# Patient Record
Sex: Female | Born: 1945 | Race: White | Hispanic: No | Marital: Married | Smoking: Former smoker
Health system: Southern US, Community
[De-identification: ages and names within clinical notes are randomized; demographics above are authoritative.]

## PROBLEM LIST (undated history)

## (undated) DIAGNOSIS — E119 Type 2 diabetes mellitus without complications: Secondary | ICD-10-CM

## (undated) DIAGNOSIS — C8330 Diffuse large B-cell lymphoma, unspecified site: Secondary | ICD-10-CM

## (undated) DIAGNOSIS — R591 Generalized enlarged lymph nodes: Secondary | ICD-10-CM

## (undated) DIAGNOSIS — E7800 Pure hypercholesterolemia, unspecified: Secondary | ICD-10-CM

## (undated) DIAGNOSIS — Z9221 Personal history of antineoplastic chemotherapy: Secondary | ICD-10-CM

## (undated) DIAGNOSIS — Z923 Personal history of irradiation: Secondary | ICD-10-CM

## (undated) DIAGNOSIS — I10 Essential (primary) hypertension: Secondary | ICD-10-CM

## (undated) DIAGNOSIS — Z8711 Personal history of peptic ulcer disease: Secondary | ICD-10-CM

## (undated) DIAGNOSIS — Z8719 Personal history of other diseases of the digestive system: Secondary | ICD-10-CM

## (undated) DIAGNOSIS — R011 Cardiac murmur, unspecified: Secondary | ICD-10-CM

## (undated) DIAGNOSIS — J849 Interstitial pulmonary disease, unspecified: Secondary | ICD-10-CM

## (undated) HISTORY — DX: Generalized enlarged lymph nodes: R59.1

## (undated) HISTORY — PX: HERNIA REPAIR: SHX51

## (undated) HISTORY — DX: Personal history of other diseases of the digestive system: Z87.19

## (undated) HISTORY — PX: TONSILLECTOMY: SUR1361

## (undated) HISTORY — DX: Personal history of peptic ulcer disease: Z87.11

## (undated) HISTORY — DX: Pure hypercholesterolemia, unspecified: E78.00

## (undated) HISTORY — PX: PARTIAL HYSTERECTOMY: SHX80

## (undated) MED FILL — Dexamethasone Sodium Phosphate Inj 100 MG/10ML: INTRAMUSCULAR | Qty: 1 | Status: AC

---

## 2004-06-01 ENCOUNTER — Ambulatory Visit: Payer: Self-pay | Admitting: Internal Medicine

## 2004-11-30 ENCOUNTER — Ambulatory Visit: Payer: Self-pay | Admitting: Internal Medicine

## 2005-11-19 ENCOUNTER — Ambulatory Visit: Payer: Self-pay | Admitting: General Surgery

## 2005-11-27 ENCOUNTER — Ambulatory Visit: Payer: Self-pay | Admitting: General Surgery

## 2006-01-16 ENCOUNTER — Other Ambulatory Visit: Payer: Self-pay

## 2006-01-23 ENCOUNTER — Inpatient Hospital Stay: Payer: Self-pay | Admitting: General Surgery

## 2006-09-16 ENCOUNTER — Ambulatory Visit: Payer: Self-pay | Admitting: General Surgery

## 2006-11-21 ENCOUNTER — Ambulatory Visit: Payer: Self-pay | Admitting: Internal Medicine

## 2006-11-25 ENCOUNTER — Ambulatory Visit: Payer: Self-pay | Admitting: Internal Medicine

## 2007-06-23 ENCOUNTER — Ambulatory Visit: Payer: Self-pay | Admitting: Internal Medicine

## 2007-11-17 ENCOUNTER — Encounter: Payer: Self-pay | Admitting: Pulmonary Disease

## 2007-11-21 ENCOUNTER — Encounter: Payer: Self-pay | Admitting: Pulmonary Disease

## 2007-12-02 ENCOUNTER — Encounter: Payer: Self-pay | Admitting: Pulmonary Disease

## 2008-01-16 ENCOUNTER — Encounter: Payer: Self-pay | Admitting: Pulmonary Disease

## 2008-01-23 ENCOUNTER — Encounter: Payer: Self-pay | Admitting: Pulmonary Disease

## 2008-03-01 ENCOUNTER — Encounter: Payer: Self-pay | Admitting: Pulmonary Disease

## 2008-03-22 ENCOUNTER — Encounter: Payer: Self-pay | Admitting: Pulmonary Disease

## 2008-07-13 ENCOUNTER — Ambulatory Visit: Payer: Self-pay | Admitting: Internal Medicine

## 2008-07-28 ENCOUNTER — Ambulatory Visit: Payer: Self-pay | Admitting: Internal Medicine

## 2008-08-20 HISTORY — PX: BREAST BIOPSY: SHX20

## 2008-10-20 ENCOUNTER — Ambulatory Visit: Payer: Self-pay | Admitting: Pulmonary Disease

## 2008-10-20 DIAGNOSIS — I10 Essential (primary) hypertension: Secondary | ICD-10-CM | POA: Insufficient documentation

## 2008-10-20 DIAGNOSIS — E119 Type 2 diabetes mellitus without complications: Secondary | ICD-10-CM

## 2008-10-20 DIAGNOSIS — J309 Allergic rhinitis, unspecified: Secondary | ICD-10-CM | POA: Insufficient documentation

## 2008-10-20 DIAGNOSIS — E785 Hyperlipidemia, unspecified: Secondary | ICD-10-CM

## 2008-10-20 DIAGNOSIS — G4733 Obstructive sleep apnea (adult) (pediatric): Secondary | ICD-10-CM

## 2008-10-20 DIAGNOSIS — J8410 Pulmonary fibrosis, unspecified: Secondary | ICD-10-CM

## 2008-11-23 ENCOUNTER — Telehealth (INDEPENDENT_AMBULATORY_CARE_PROVIDER_SITE_OTHER): Payer: Self-pay | Admitting: *Deleted

## 2008-12-15 ENCOUNTER — Encounter: Payer: Self-pay | Admitting: Pulmonary Disease

## 2009-01-04 ENCOUNTER — Ambulatory Visit: Payer: Self-pay | Admitting: Cardiovascular Disease

## 2009-01-04 ENCOUNTER — Ambulatory Visit: Payer: Self-pay | Admitting: Pulmonary Disease

## 2009-01-08 ENCOUNTER — Emergency Department: Payer: Self-pay | Admitting: Emergency Medicine

## 2009-01-10 ENCOUNTER — Telehealth (INDEPENDENT_AMBULATORY_CARE_PROVIDER_SITE_OTHER): Payer: Self-pay | Admitting: *Deleted

## 2009-01-12 ENCOUNTER — Ambulatory Visit: Payer: Self-pay | Admitting: Pulmonary Disease

## 2009-01-26 ENCOUNTER — Ambulatory Visit: Payer: Self-pay | Admitting: Internal Medicine

## 2009-07-11 ENCOUNTER — Ambulatory Visit: Payer: Self-pay | Admitting: Pulmonary Disease

## 2010-01-04 ENCOUNTER — Ambulatory Visit: Payer: Self-pay | Admitting: Pulmonary Disease

## 2010-01-04 ENCOUNTER — Telehealth (INDEPENDENT_AMBULATORY_CARE_PROVIDER_SITE_OTHER): Payer: Self-pay | Admitting: *Deleted

## 2010-06-29 ENCOUNTER — Inpatient Hospital Stay: Payer: Self-pay

## 2010-09-19 NOTE — Progress Notes (Signed)
Summary: need old cxr films  LMTCBx1  Phone Note Outgoing Call Call back at [facility] Phone [phone]   Call placed by: Arman Filter LPN,  Jan 04, 2010 11:17 AM Call placed to: Patient Summary of Call: Pt was just seen by [facility] today.  KC informed pt will will have [facility] radiologist view cxr from today and compare it with cxr done at [facility].  Called and spoke with radiology dept at [facility] and was informed they have no way to access cxrs from [facility].  Pt will therefore need to go to [facility] and pick up a disc with a copy of her cxr images on it and drop it off at the office for [facility] to review.  LMOMTCBX1 to inform pt of this.  Arman Filter LPN  Jan 04, 2010 11:20 AM  Initial call taken by: Arman Filter LPN,  Jan 04, 2010 11:20 AM  Follow-up for Phone Call        pt called back [phone] - please call her back. Follow-up by: Eugene Gavia,  Jan 04, 2010 1:12 PM  Additional Follow-up for Phone Call Additional follow up Details #1::        called and spoke with pt.  pt aware of the above information.  Pt states she will go to [facility] and request cxr on disk and bring to the office for [facility] to view.  Arman Filter LPN  Jan 04, 2010 4:33 PM      Appended Document: need old cxr films  LMTCBx1 I have not received disc from [facility] yet. But did receive a disc from [facility] with cxr from 03/2008 and 10/2007.  Put in Kc's very important look at folder for him to review.

## 2010-09-19 NOTE — Assessment & Plan Note (Signed)
Summary: rov for PF   Copy to:  Self Referral Primary Provider/Referring Provider:  Gardiner Rhyme at [facility]  CC:  Pt is here for a f/u appt to discuss PFT and CXR results. .  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  It has been unclear whether this is related to a CT disease with ANA very elevated, or whether this is simply IPF.  She has been evaluated at [facility] by pulm and rheum, and surveillance was recommended at this point.  She has had pfts which show very little change from prior, and her cxr today shows changes of PF.  Her last film was in [place], and we need to get this for comparison, but by my memory shows little change.  The pt feels that she is at her usual baseline, with no change in exertional tolerance or breathing.  She is staying very active.  Denies any significant cough or congestion.  Current Medications (verified): 1)  None  Allergies (verified): No Known Drug Allergies  Review of Systems      See HPI  Vital Signs:  Patient profile:   65 year old female Height:      67 inches Weight:      181 pounds BMI:     28.45 O2 Sat:      96 % on Room air Temp:     98.1 degrees F oral Pulse rate:   110 / minute BP sitting:   158 / 96  (right arm) Cuff size:   regular  Vitals Entered By: Arman Filter LPN (Jan 04, 2010 10:01 AM)  O2 Flow:  Room air CC: Pt is here for a f/u appt to discuss PFT and CXR results.  Comments Medications reviewed with patient. Megan Reynolds LPN  Jan 04, 2010 10:02 AM    Physical Exam  General:  wd female in nad Lungs:  crackles 1/3 up bilat with no wheezing or rhonchi Heart:  rrr, no mrg Extremities:  minimal LE edema, no cyanosis Neurologic:  alert and oriented, moves all 4.   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  At this point, will continue to  monitor her closely, and she is to call if she has worsening symptoms.  I will obtain disc of her cxr from [facility] to directly compare to her current film.  She will followup with me in 6mos to check on her progress.  Other Orders: T-2 View CXR (71020TC) Est. Patient Level III (16109)  Patient Instructions: 1)  no change in treatment plans.  Please call if you begin to have worsening symptoms 2)  work on weight loss and conditioning. 3)  followup with me in 6mos   Immunization History:  Influenza Immunization History:    Influenza:  historical (05/20/2009)  Pneumovax Immunization History:    Pneumovax:  historical (05/20/2008)

## 2010-09-19 NOTE — Miscellaneous (Signed)
Summary: Orders Update pft charges  Clinical Lists Changes  Orders: Added new Service order of Carbon Monoxide diffusing w/capacity (94720) - Signed Added new Service order of Lung Volumes (94240) - Signed Added new Service order of Spirometry (Pre & Post) (94060) - Signed 

## 2010-10-23 ENCOUNTER — Ambulatory Visit: Payer: Self-pay | Admitting: Gastroenterology

## 2011-01-03 ENCOUNTER — Ambulatory Visit: Payer: Self-pay | Admitting: Internal Medicine

## 2011-07-10 ENCOUNTER — Ambulatory Visit: Payer: Self-pay | Admitting: Internal Medicine

## 2011-07-21 ENCOUNTER — Ambulatory Visit: Payer: Self-pay | Admitting: Internal Medicine

## 2011-08-21 ENCOUNTER — Ambulatory Visit: Payer: Self-pay | Admitting: Internal Medicine

## 2012-03-06 ENCOUNTER — Ambulatory Visit: Payer: Self-pay | Admitting: Internal Medicine

## 2013-03-10 ENCOUNTER — Ambulatory Visit: Payer: Self-pay | Admitting: Internal Medicine

## 2013-03-23 DIAGNOSIS — J849 Interstitial pulmonary disease, unspecified: Secondary | ICD-10-CM | POA: Insufficient documentation

## 2014-04-13 ENCOUNTER — Ambulatory Visit: Payer: Self-pay | Admitting: Internal Medicine

## 2014-04-15 ENCOUNTER — Ambulatory Visit: Payer: Self-pay | Admitting: Internal Medicine

## 2014-07-01 ENCOUNTER — Ambulatory Visit: Payer: Self-pay | Admitting: Ophthalmology

## 2014-07-01 LAB — POTASSIUM: POTASSIUM: 4.1 mmol/L (ref 3.5–5.1)

## 2014-07-12 ENCOUNTER — Ambulatory Visit: Payer: Self-pay | Admitting: Ophthalmology

## 2014-12-11 NOTE — Op Note (Signed)
PATIENT NAME:  Rose Hill, Rose Hill MR#:  [id number] DATE OF BIRTH:  1946-08-14  DATE OF PROCEDURE:  07/12/2014  PREOPERATIVE DIAGNOSIS: Cataract, right eye.   POSTOPERATIVE DIAGNOSIS:  Cataract, right eye.  PROCEDURE PERFORMED: Extracapsular cataract extraction using phacoemulsification with placement Alcon [id number], 23.0 diopter posterior chamber lens, serial number [id number].   SURGEON: Loura Back. Kalena Mander, M.D.   ANESTHESIA: 4% lidocaine and 0.75% Marcaine, 50-50 ratio of 10 units per mL of Hylenex added given as peribulbar.   ANESTHESIOLOGIST:  Dr. Benjamine Mola.   COMPLICATIONS: None.   ESTIMATED BLOOD LOSS:  Less than 1 ml.  DESCRIPTION OF PROCEDURE:  The patient was brought to the operating room and given a peribulbar block.  The patient was then prepped and draped in the usual fashion.  The vertical rectus muscles were imbricated using 5-0 silk sutures.  These sutures were then clamped to the sterile drapes as bridle sutures.  A limbal peritomy was performed extending two clock hours and hemostasis was obtained with cautery.  A partial thickness scleral groove was made at the surgical limbus and dissected anteriorly in a lamellar dissection using an Alcon crescent knife.  The anterior chamber was entered supero-temporally with a Superblade and through the lamellar dissection with a 2.6 mm keratome.  DisCoVisc was used to replace the aqueous and a continuous tear capsulorrhexis was carried out.  Hydrodissection and hydrodelineation were carried out with balanced salt and a 27 gauge canula.  The nucleus was rotated to confirm the effectiveness of the hydrodissection.  Phacoemulsification was carried out using a divide-and-conquer technique.  Total ultrasound time was 1 minutes and 30 seconds with an average power of 24.3%, CDE of 42.55.   Irrigation/aspiration was used to remove the residual cortex.  DisCoVisc was used to inflate the capsule and the internal incision was enlarged to 3 mm with the  crescent knife.  The intraocular lens was folded and inserted into the capsular bag using the AcrySert delivery system. Irrigation/aspiration was used to remove the residual DisCoVisc.  A 0.10 mL of cefuroxime containing 1 mg of drug was injected into the anterior chamber through the paracentesis track to inflate the anterior chamber and induce miosis.  The wound was checked for leaks and none were found. The conjunctiva was closed with cautery and the bridle sutures were removed.  Two drops of 0.3% Vigamox were placed on the eye.   An eye shield was placed on the eye.  The patient was discharged to the recovery room in good condition.     ____________________________ Loura Back Rose Sturgell, MD sad:DT D: 07/12/2014 13:15:12 ET T: 07/12/2014 13:58:22 ET JOB#: [id number]  cc: Remo Lipps A. Belvie Iribe, MD, <Dictator> Martie Lee MD ELECTRONICALLY SIGNED 07/19/2014 13:25

## 2015-04-01 ENCOUNTER — Other Ambulatory Visit: Payer: Self-pay | Admitting: Internal Medicine

## 2015-04-01 DIAGNOSIS — M5010 Cervical disc disorder with radiculopathy, unspecified cervical region: Secondary | ICD-10-CM

## 2015-04-01 DIAGNOSIS — Z1231 Encounter for screening mammogram for malignant neoplasm of breast: Secondary | ICD-10-CM

## 2015-04-06 ENCOUNTER — Ambulatory Visit
Admission: RE | Admit: 2015-04-06 | Discharge: 2015-04-06 | Disposition: A | Discharge status: Home / Self Care | Payer: Medicare Other | Source: Ambulatory Visit | Attending: Internal Medicine | Admitting: Internal Medicine

## 2015-04-06 DIAGNOSIS — M5010 Cervical disc disorder with radiculopathy, unspecified cervical region: Secondary | ICD-10-CM

## 2015-04-15 ENCOUNTER — Other Ambulatory Visit: Payer: Self-pay | Admitting: Internal Medicine

## 2015-04-15 ENCOUNTER — Ambulatory Visit
Admission: RE | Admit: 2015-04-15 | Discharge: 2015-04-15 | Disposition: A | Discharge status: Home / Self Care | Payer: Medicare Other | Source: Ambulatory Visit | Attending: Internal Medicine | Admitting: Internal Medicine

## 2015-04-15 DIAGNOSIS — Z1231 Encounter for screening mammogram for malignant neoplasm of breast: Secondary | ICD-10-CM | POA: Diagnosis not present

## 2015-06-20 DIAGNOSIS — K219 Gastro-esophageal reflux disease without esophagitis: Secondary | ICD-10-CM | POA: Insufficient documentation

## 2016-03-25 DIAGNOSIS — Z85828 Personal history of other malignant neoplasm of skin: Secondary | ICD-10-CM | POA: Insufficient documentation

## 2016-04-03 DIAGNOSIS — Z0000 Encounter for general adult medical examination without abnormal findings: Secondary | ICD-10-CM | POA: Insufficient documentation

## 2016-07-25 ENCOUNTER — Other Ambulatory Visit: Payer: Self-pay | Admitting: Internal Medicine

## 2016-07-25 DIAGNOSIS — R1084 Generalized abdominal pain: Secondary | ICD-10-CM

## 2016-07-26 ENCOUNTER — Ambulatory Visit
Admission: RE | Admit: 2016-07-26 | Discharge: 2016-07-26 | Disposition: A | Discharge status: Home / Self Care | Payer: Medicare Other | Source: Ambulatory Visit | Attending: Internal Medicine | Admitting: Internal Medicine

## 2016-07-26 DIAGNOSIS — R1084 Generalized abdominal pain: Secondary | ICD-10-CM

## 2016-07-26 DIAGNOSIS — Z90710 Acquired absence of both cervix and uterus: Secondary | ICD-10-CM | POA: Insufficient documentation

## 2016-07-26 DIAGNOSIS — R590 Localized enlarged lymph nodes: Secondary | ICD-10-CM | POA: Insufficient documentation

## 2016-07-26 DIAGNOSIS — K449 Diaphragmatic hernia without obstruction or gangrene: Secondary | ICD-10-CM | POA: Insufficient documentation

## 2016-07-26 DIAGNOSIS — R932 Abnormal findings on diagnostic imaging of liver and biliary tract: Secondary | ICD-10-CM | POA: Diagnosis not present

## 2016-07-26 HISTORY — DX: Type 2 diabetes mellitus without complications: E11.9

## 2016-07-26 HISTORY — DX: Essential (primary) hypertension: I10

## 2016-07-26 HISTORY — DX: Interstitial pulmonary disease, unspecified: J84.9

## 2016-07-26 MED ORDER — IOPAMIDOL (ISOVUE-300) INJECTION 61%
100.0000 mL | Freq: Once | INTRAVENOUS | Status: AC | PRN
  Administered 2016-07-26: 100 mL via INTRAVENOUS

## 2016-07-27 DIAGNOSIS — C8303 Small cell B-cell lymphoma, intra-abdominal lymph nodes: Secondary | ICD-10-CM | POA: Insufficient documentation

## 2016-07-31 ENCOUNTER — Inpatient Hospital Stay: Payer: Medicare Other | Attending: Internal Medicine | Admitting: Internal Medicine

## 2016-07-31 ENCOUNTER — Encounter: Payer: Self-pay | Admitting: Internal Medicine

## 2016-07-31 ENCOUNTER — Telehealth: Payer: Self-pay | Admitting: *Deleted

## 2016-07-31 ENCOUNTER — Inpatient Hospital Stay: Payer: Medicare Other

## 2016-07-31 ENCOUNTER — Other Ambulatory Visit: Payer: Self-pay | Admitting: *Deleted

## 2016-07-31 DIAGNOSIS — I700 Atherosclerosis of aorta: Secondary | ICD-10-CM | POA: Insufficient documentation

## 2016-07-31 DIAGNOSIS — E7800 Pure hypercholesterolemia, unspecified: Secondary | ICD-10-CM | POA: Diagnosis not present

## 2016-07-31 DIAGNOSIS — J849 Interstitial pulmonary disease, unspecified: Secondary | ICD-10-CM | POA: Diagnosis not present

## 2016-07-31 DIAGNOSIS — Z7984 Long term (current) use of oral hypoglycemic drugs: Secondary | ICD-10-CM | POA: Insufficient documentation

## 2016-07-31 DIAGNOSIS — R61 Generalized hyperhidrosis: Secondary | ICD-10-CM | POA: Diagnosis not present

## 2016-07-31 DIAGNOSIS — Z87891 Personal history of nicotine dependence: Secondary | ICD-10-CM

## 2016-07-31 DIAGNOSIS — F40240 Claustrophobia: Secondary | ICD-10-CM

## 2016-07-31 DIAGNOSIS — R590 Localized enlarged lymph nodes: Secondary | ICD-10-CM | POA: Insufficient documentation

## 2016-07-31 DIAGNOSIS — Z8669 Personal history of other diseases of the nervous system and sense organs: Secondary | ICD-10-CM | POA: Diagnosis not present

## 2016-07-31 DIAGNOSIS — R1900 Intra-abdominal and pelvic swelling, mass and lump, unspecified site: Secondary | ICD-10-CM | POA: Insufficient documentation

## 2016-07-31 DIAGNOSIS — I10 Essential (primary) hypertension: Secondary | ICD-10-CM | POA: Diagnosis not present

## 2016-07-31 DIAGNOSIS — M5136 Other intervertebral disc degeneration, lumbar region: Secondary | ICD-10-CM | POA: Insufficient documentation

## 2016-07-31 DIAGNOSIS — E119 Type 2 diabetes mellitus without complications: Secondary | ICD-10-CM | POA: Insufficient documentation

## 2016-07-31 DIAGNOSIS — R109 Unspecified abdominal pain: Secondary | ICD-10-CM | POA: Insufficient documentation

## 2016-07-31 DIAGNOSIS — Z79899 Other long term (current) drug therapy: Secondary | ICD-10-CM | POA: Diagnosis not present

## 2016-07-31 DIAGNOSIS — R197 Diarrhea, unspecified: Secondary | ICD-10-CM | POA: Diagnosis not present

## 2016-07-31 DIAGNOSIS — K449 Diaphragmatic hernia without obstruction or gangrene: Secondary | ICD-10-CM | POA: Diagnosis not present

## 2016-07-31 LAB — COMPREHENSIVE METABOLIC PANEL
ALT: 10 U/L — ABNORMAL LOW (ref 14–54)
AST: 17 U/L (ref 15–41)
Albumin: 4.4 g/dL (ref 3.5–5.0)
Alkaline Phosphatase: 52 U/L (ref 38–126)
Anion gap: 11 (ref 5–15)
BUN: 17 mg/dL (ref 6–20)
CO2: 25 mmol/L (ref 22–32)
Calcium: 9.3 mg/dL (ref 8.9–10.3)
Chloride: 100 mmol/L — ABNORMAL LOW (ref 101–111)
Creatinine, Ser: 0.54 mg/dL (ref 0.44–1.00)
GFR calc Af Amer: >60 mL/min (ref >60)
GFR calc non Af Amer: >60 mL/min (ref >60)
Glucose, Bld: 113 mg/dL — ABNORMAL HIGH (ref 65–99)
Potassium: 3.7 mmol/L (ref 3.5–5.1)
Sodium: 136 mmol/L (ref 135–145)
Total Bilirubin: 0.7 mg/dL (ref 0.3–1.2)
Total Protein: 8.1 g/dL (ref 6.5–8.1)

## 2016-07-31 LAB — CBC WITH DIFFERENTIAL/PLATELET
Basophils Absolute: 0.1 10*3/uL (ref 0–0.1)
Basophils Relative: 1 %
Eosinophils Absolute: 0.2 10*3/uL (ref 0–0.7)
Eosinophils Relative: 2 %
HCT: 40.6 % (ref 35.0–47.0)
Hemoglobin: 13.5 g/dL (ref 12.0–16.0)
Lymphocytes Relative: 20 %
Lymphs Abs: 1.6 10*3/uL (ref 1.0–3.6)
MCH: 27.5 pg (ref 26.0–34.0)
MCHC: 33.2 g/dL (ref 32.0–36.0)
MCV: 82.9 fL (ref 80.0–100.0)
Monocytes Absolute: 0.8 10*3/uL (ref 0.2–0.9)
Monocytes Relative: 10 %
Neutro Abs: 5.1 10*3/uL (ref 1.4–6.5)
Neutrophils Relative %: 67 %
Platelets: 273 10*3/uL (ref 150–440)
RBC: 4.91 MIL/uL (ref 3.80–5.20)
RDW: 13.9 % (ref 11.5–14.5)
WBC: 7.7 10*3/uL (ref 3.6–11.0)

## 2016-07-31 LAB — PROTIME-INR
INR: 0.92
PROTHROMBIN TIME: 12.4 s (ref 11.4–15.2)

## 2016-07-31 LAB — LACTATE DEHYDROGENASE: LDH: 155 U/L (ref 98–192)

## 2016-07-31 LAB — APTT: aPTT: 27 seconds (ref 24–36)

## 2016-07-31 MED ORDER — DIAZEPAM 5 MG PO TABS: 5.0000 mg | ORAL_TABLET | Freq: Once | ORAL | 0 refills | Status: AC

## 2016-07-31 NOTE — Telephone Encounter (Signed)
Rx for diazepam 5 mg #3 sent to patients pharmacy.

## 2016-07-31 NOTE — Assessment & Plan Note (Addendum)
#  Bulky retroperitoneal lymphadenopathy [approximately 8-10 cm mass wrapping around the renal blood vessels.] highly suspicious for lymphoma. Recommend CBC CMP and LDH and beta-2 microglobulin. Also recommend SPEP. Recommend a PET scan for further evaluation. Patient needs a tissue diagnosis. Order hepatitis workup. Also discussed that patient might need a bone marrow biopsy if confirmed as lymphoma.  # I again stressed the importance of tissue diagnosis- before discussing treatment options or prognosis.   # I recommend a follow-up in approximately 1 week; based upon above workup. I discussed with radiology Dr.Henn- agrees with biopsy under CT guidance; however will wait for PET other easily accessible lesions.   # The above plan of care was discussed with the patient and family in detail. # I reviewed the imaging independently [as summarized above]; and with the patient in detail.   Thank you Dr. Sabra Heck for allowing me to participate in the care of your pleasant patient. Please do not hesitate to contact me with questions or concerns in the interim.

## 2016-07-31 NOTE — Telephone Encounter (Signed)
-----   Message from [place] sent at 07/31/2016 10:48 AM EST ----- Regarding: Pt needs sedative for PET Patient is scheduled for PET Friday, Dec. 15 at 11:30 with 11am arrival time. She has claustrophobia and will require a sedative called in to her pharmacy in advance. Thanks.

## 2016-07-31 NOTE — Progress Notes (Signed)
[place] NOTE  Patient Care Team: Rusty Aus, [name]  CHIEF COMPLAINTS/PURPOSE OF CONSULTATION: Abdominal mass  # DEC 2017- bulky retroperitoneal adenopathy ~8x10 cm   # ILD [ idopathic]   No history exists.     HISTORY OF PRESENTING ILLNESS:  Rose Hill 70 y.o.  female with a history of interstitial lung disease idiopathic;  has been referred to [place] for further evaluation and recommendations regarding a large abdominal mass.  Patient stated that she had noted abdominal pain for the last few weeks progressively getting worse. She also had episode diarrhea that lasted approximately 3 days on which time the abdominal pain has also gotten worse. Patient had chronic sweats which is not any worse. Denies any headaches or vision changes. Denies any tingling or numbness.  ROS: A complete 10 point review of system is done which is negative except mentioned above in history of present illness  MEDICAL HISTORY:  Past Medical History:  Diagnosis Date  . Diabetes mellitus without complication ([place])   . History of gastric ulcer   . Hypercholesterolemia   . Hypertension   . ILD (interstitial lung disease) ([place])    8 yrs ago  . Lymphadenopathy     SURGICAL HISTORY: Past Surgical History:  Procedure Laterality Date  . BREAST BIOPSY Left 2010   neg  . PARTIAL HYSTERECTOMY     age 59    SOCIAL HISTORY: quit smoking 40 years ago; human resources retd; in [place]; no alcohol Social History   Social History  . Marital status: Married    Spouse name: N/A  . Number of children: N/A  . Years of education: N/A   Occupational History  . Not on file.   Social History Main Topics  . Smoking status: Not on file  . Smokeless tobacco: Not on file  . Alcohol use Not on file  . Drug use: Unknown  . Sexual activity: Not on file   Other Topics Concern  . Not on file   Social History Narrative  . No narrative on file     FAMILY HISTORY: Family History  Problem Relation Age of Onset  . Heart disease Mother   . Heart disease Father   . Heart disease Brother     ALLERGIES:  is allergic to cefuroxime axetil and doxycycline.  MEDICATIONS:  Current Outpatient Prescriptions  Medication Sig Dispense Refill  . latanoprost (XALATAN) 0.005 % ophthalmic solution Place 1 drop into both eyes Nightly.    Marland Kitchen losartan-hydrochlorothiazide (HYZAAR) 100-12.5 MG tablet Take 1 tablet by mouth daily.    . metFORMIN (GLUCOPHAGE) 500 MG tablet Take 1 tablet by mouth daily.    . pantoprazole (PROTONIX) 40 MG tablet Take 1 tablet by mouth 2 (two) times daily.    . pravastatin (PRAVACHOL) 40 MG tablet Take 1 tablet by mouth daily.    . diazepam (VALIUM) 5 MG tablet Take 1 tablet (5 mg total) by mouth once. 1/2 hr prior to scan 3 tablet 0   No current facility-administered medications for this visit.       Marland Kitchen  PHYSICAL EXAMINATION: ECOG PERFORMANCE STATUS: 1 - Symptomatic but completely ambulatory  Vitals:   07/31/16 0848  BP: 127/69  Pulse: 72  Resp: 20  Temp: 98.5 F (36.9 C)   Filed Weights   07/31/16 0848  Weight: 158 lb 15.2 oz (72.1 kg)    GENERAL: Well-nourished well-developed; Alert, no distress and comfortable.   With  her husband. EYES: no pallor or icterus OROPHARYNX: no thrush or ulceration; good dentition  NECK: supple, no masses felt LYMPH:  no palpable lymphadenopathy in the cervical, axillary or inguinal regions LUNGS: clear to auscultation and  No wheeze or crackles HEART/CVS: regular rate & rhythm and no murmurs; No lower extremity edema ABDOMEN: abdomen soft, mild tenderness on deep palpation and normal bowel sounds. No hepatosplenomegaly Musculoskeletal:no cyanosis of digits and no clubbing  PSYCH: alert & oriented x 3 with fluent speech NEURO: no focal motor/sensory deficits SKIN:  no rashes or significant lesions  LABORATORY DATA:  I have reviewed the data as listed Lab Results   Component Value Date   WBC 7.7 07/31/2016   HGB 13.5 07/31/2016   HCT 40.6 07/31/2016   MCV 82.9 07/31/2016   PLT 273 07/31/2016    Recent Labs  07/31/16 0947  NA 136  K 3.7  CL 100*  CO2 25  GLUCOSE 113*  BUN 17  CREATININE 0.54  CALCIUM 9.3  GFRNONAA >60  GFRAA >60  PROT 8.1  ALBUMIN 4.4  AST 17  ALT 10*  ALKPHOS 52  BILITOT 0.7    RADIOGRAPHIC STUDIES: I have personally reviewed the radiological images as listed and agreed with the findings in the report. Ct Abdomen Pelvis W Contrast  Result Date: 07/26/2016 CLINICAL DATA:  Left lower quadrant pain with diarrhea x 11 days EXAM: CT ABDOMEN AND PELVIS WITH CONTRAST TECHNIQUE: Multidetector CT imaging of the abdomen and pelvis was performed using the standard protocol following bolus administration of intravenous contrast. CONTRAST:  163m ISOVUE-300 IOPAMIDOL (ISOVUE-300) INJECTION 61% COMPARISON:  11/27/2005 upper GI with small bowel series FINDINGS: Lower chest: Small hiatal hernia. Subpleural areas of fibrosis and bibasilar scarring. Moderate-sized hiatal hernia. Top the visualized cardiac chambers are within normal limits. No pericardial effusion. Hepatobiliary: Nonenhancing hypodensities are noted within the left and right hepatic lobes the largest is in the left hepatic lobe measuring up to 7 mm. Findings are in keeping with cysts but are too small to further characterize. No enhancement on delayed images. Pancreas: Unremarkable.  No ductal dilatation. Spleen: No splenomegaly. Adrenals/Urinary Tract: Normal bilateral adrenal glands. The kidneys demonstrate no obstructive uropathy or enhancing mass. Stomach/Bowel: Normal bowel rotation. No bowel obstruction. No bowel fold thickening, wall thickening or inflammation. Normal-appearing appendix. Vascular/Lymphatic: There is a bulky retroperitoneal mass in the upper abdomen measuring 8 x 9.3 x 10.6 cm in craniocaudad by AP by transverse dimension outlining the aorta, displacing  the IVC laterally to the right and enveloping the renal arteries and veins. There is a mild to moderate engorgement of the left renal vein from extrinsic mass effect as well as on the right renal vein. This upper margin of this mass starts at the SMA without involving the SMA with its caudal margin approximate 3.8 cm above the aortic bifurcation. Small subcentimeter porta hepatic and peripancreatic lymph nodes on the order of 7 mm or less are noted. No pelvic lymphadenopathy identified. The aorta is atherosclerotic without aneurysm or dissection. Reproductive: No adnexal mass. The patient appears to be status post hysterectomy. Other: No ascites or free air. Musculoskeletal: Degenerative disc disease L4-5 and L5-S1. Schmorl's node involving the T12 superior endplate. No frank bone destruction or acute fracture. IMPRESSION: Bulky retroperitoneal lymphadenopathy in the upper abdomen measuring 8 x 9.3 x 10.6 cm enveloping the aorta and both renal arteries and displacing the IVC and rena with l veins bilaterally. Findings would be in keeping with lymphoma. No splenomegaly. Hysterectomy. Moderate-sized  hiatal hernia. Nonenhancing sub cm hepatic hypodensities likely representing cysts. These results will be called to the ordering clinician or representative by the Radiologist Assistant, and communication documented in the PACS or zVision Dashboard. Electronically Signed   By: Ashley Royalty M.D.   On: 07/26/2016 13:43    ASSESSMENT & PLAN:   Retroperitoneal lymphadenopathy # Bulky retroperitoneal lymphadenopathy [approximately 8-10 cm mass wrapping around the renal blood vessels.] highly suspicious for lymphoma. Recommend CBC CMP and LDH and beta-2 microglobulin. Also recommend SPEP. Recommend a PET scan for further evaluation. Patient needs a tissue diagnosis. Order hepatitis workup. Also discussed that patient might need a bone marrow biopsy if confirmed as lymphoma.  # I again stressed the importance of tissue  diagnosis- before discussing treatment options or prognosis.   # I recommend a follow-up in approximately 1 week; based upon above workup.  # The above plan of care was discussed with the patient and family in detail. # I reviewed the imaging independently [as summarized above]; and with the patient in detail.    Thank you Dr. Sabra Heck for allowing me to participate in the care of your pleasant patient. Please do not hesitate to contact me with questions or concerns in the interim.  All questions were answered. The patient knows to call the clinic with any problems, questions or concerns.    Cammie Sickle, MD 07/31/2016 4:43 PM

## 2016-07-31 NOTE — Progress Notes (Signed)
Patient had influenza vaccine administered approx. 2-3 weeks ago @ [facility] pharmacy

## 2016-08-01 LAB — HEPATITIS B CORE ANTIBODY, IGM: Hep B C IgM: NEGATIVE

## 2016-08-01 LAB — HEPATITIS B SURFACE ANTIGEN: Hepatitis B Surface Ag: NEGATIVE

## 2016-08-01 LAB — BETA 2 MICROGLOBULIN, SERUM: BETA 2 MICROGLOBULIN: 1.8 mg/L (ref 0.6–2.4)

## 2016-08-02 LAB — IMMUNOFIXATION ELECTROPHORESIS
IgA: 240 mg/dL (ref 87–352)
IgG (Immunoglobin G), Serum: 1193 mg/dL (ref 700–1600)
IgM, Serum: 43 mg/dL (ref 26–217)
TOTAL PROTEIN ELP: 7.3 g/dL (ref 6.0–8.5)

## 2016-08-03 ENCOUNTER — Telehealth: Payer: Self-pay | Admitting: Internal Medicine

## 2016-08-03 ENCOUNTER — Ambulatory Visit
Admission: RE | Admit: 2016-08-03 | Discharge: 2016-08-03 | Disposition: A | Discharge status: Home / Self Care | Payer: Medicare Other | Source: Ambulatory Visit | Attending: Internal Medicine | Admitting: Internal Medicine

## 2016-08-03 DIAGNOSIS — R590 Localized enlarged lymph nodes: Secondary | ICD-10-CM | POA: Insufficient documentation

## 2016-08-03 DIAGNOSIS — C8590 Non-Hodgkin lymphoma, unspecified, unspecified site: Secondary | ICD-10-CM | POA: Diagnosis present

## 2016-08-03 LAB — GLUCOSE, CAPILLARY: Glucose-Capillary: 111 mg/dL — ABNORMAL HIGH (ref 65–99)

## 2016-08-03 MED ORDER — FLUDEOXYGLUCOSE F - 18 (FDG) INJECTION
12.0000 | Freq: Once | INTRAVENOUS | Status: AC | PRN
  Administered 2016-08-03: 12.05 via INTRAVENOUS

## 2016-08-03 NOTE — Telephone Encounter (Signed)
Spoke to pt re: results of the PET scan; plan Bx on 20th CT guidance; discussed NPO instructions. Will follow up with Dr.Rao the week after biopsy in my absence.

## 2016-08-06 ENCOUNTER — Inpatient Hospital Stay: Payer: Medicare Other | Admitting: Internal Medicine

## 2016-08-07 ENCOUNTER — Other Ambulatory Visit: Payer: Self-pay | Admitting: Radiology

## 2016-08-07 ENCOUNTER — Ambulatory Visit: Payer: PPO | Admitting: Internal Medicine

## 2016-08-07 NOTE — Discharge Instructions (Signed)
Needle Biopsy, Care After °Introduction °Refer to this sheet in the next few weeks. These instructions provide you with information about caring for yourself after your procedure. Your health care provider may also give you more specific instructions. Your treatment has been planned according to current medical practices, but problems sometimes occur. Call your health care provider if you have any problems or questions after your procedure. °What can I expect after the procedure? °After your procedure, it is common to have soreness, bruising, or mild pain at the biopsy site. This should go away in a few days. °Follow these instructions at home: °· Rest as directed by your health care provider. °· Take medicines only as directed by your health care provider. °· There are many different ways to close and cover the biopsy site, including stitches (sutures), skin glue, and adhesive strips. Follow your health care provider's instructions about: °¨ Biopsy site care. °¨ Bandage (dressing) changes and removal. °¨ Biopsy site closure removal. °· Check your biopsy site every day for signs of infection. Watch for: °¨ Redness, swelling, or pain. °¨ Fluid, blood, or pus. °Contact a health care provider if: °· You have a fever. °· You have redness, swelling, or pain at the biopsy site that lasts longer than a few days. °· You have fluid, blood, or pus coming from the biopsy site. °· You feel nauseous. °· You vomit. °Get help right away if: °· You have shortness of breath. °· You have trouble breathing. °· You have chest pain. °· You feel dizzy or you faint. °· You have bleeding that does not stop with pressure or a bandage. °· You cough up blood. °· You have pain in your abdomen. °This information is not intended to replace advice given to you by your health care provider. Make sure you discuss any questions you have with your health care provider. °Document Released: 12/21/2014 Document Revised: 01/12/2016 Document Reviewed:  08/02/2014 °© 2017 [employer] ° °

## 2016-08-08 ENCOUNTER — Ambulatory Visit
Admission: RE | Admit: 2016-08-08 | Discharge: 2016-08-08 | Disposition: A | Discharge status: Home / Self Care | Payer: Medicare Other | Source: Ambulatory Visit | Attending: Internal Medicine | Admitting: Internal Medicine

## 2016-08-08 DIAGNOSIS — Z7984 Long term (current) use of oral hypoglycemic drugs: Secondary | ICD-10-CM | POA: Diagnosis not present

## 2016-08-08 DIAGNOSIS — R590 Localized enlarged lymph nodes: Secondary | ICD-10-CM | POA: Diagnosis present

## 2016-08-08 DIAGNOSIS — C8333 Diffuse large B-cell lymphoma, intra-abdominal lymph nodes: Secondary | ICD-10-CM | POA: Insufficient documentation

## 2016-08-08 DIAGNOSIS — Z79899 Other long term (current) drug therapy: Secondary | ICD-10-CM | POA: Insufficient documentation

## 2016-08-08 DIAGNOSIS — Z87891 Personal history of nicotine dependence: Secondary | ICD-10-CM | POA: Insufficient documentation

## 2016-08-08 MED ORDER — SODIUM CHLORIDE 0.9 % IV SOLN
INTRAVENOUS | Status: DC
  Administered 2016-08-08: 1000 mL via INTRAVENOUS

## 2016-08-08 MED ORDER — MIDAZOLAM HCL 2 MG/2ML IJ SOLN
INTRAMUSCULAR | Status: AC | PRN
  Administered 2016-08-08: 1 mg via INTRAVENOUS

## 2016-08-08 MED ORDER — HYDROCODONE-ACETAMINOPHEN 5-325 MG PO TABS: 1.0000 | ORAL_TABLET | ORAL | Status: DC | PRN

## 2016-08-08 NOTE — H&P (Signed)
Chief Complaint: Patient was seen in consultation today for CT-guided biopsy at the request of Brahmanday,Govinda R  Referring Physician(s): Brahmanday,Govinda R  Patient Status: [facility] - Out-pt  History of Present Illness:  Rose [place] is a 69 y.o. female with complaints of left lower abdominal pain. Patient had CT imaging demonstrating extensive retroperitoneal lymphadenopathy. Patient has also had a PET CT demonstrating hypermetabolic activity throughout the retroperitoneal adenopathy. Findings are concerning for a neoplastic process such as lymphoma and the patient needs a tissue diagnosis. Patient has some chronic shortness of breath related to interstitial lung disease. No change in weight, appetite or bowel habits.  Past Medical History:  Diagnosis Date  . Diabetes mellitus without complication ([place])   . History of gastric ulcer   . Hypercholesterolemia   . Hypertension   . ILD (interstitial lung disease) ([place])    8 yrs ago  . Lymphadenopathy     Past Surgical History:  Procedure Laterality Date  . BREAST BIOPSY Left 2010   neg  . PARTIAL HYSTERECTOMY     age 25    Allergies: Cefuroxime axetil and Doxycycline  Medications: Prior to Admission medications   Medication Sig Start Date End Date Taking? Authorizing Provider  latanoprost (XALATAN) 0.005 % ophthalmic solution Place 1 drop into both eyes Nightly. 01/21/16  Yes Historical Provider, MD  losartan-hydrochlorothiazide (HYZAAR) 100-12.5 MG tablet Take 1 tablet by mouth daily. 04/03/16  Yes Historical Provider, MD  metFORMIN (GLUCOPHAGE) 500 MG tablet Take 1 tablet by mouth daily. 04/03/16  Yes Historical Provider, MD  pantoprazole (PROTONIX) 40 MG tablet Take 1 tablet by mouth 2 (two) times daily. 07/19/16  Yes Historical Provider, MD  pravastatin (PRAVACHOL) 40 MG tablet Take 1 tablet by mouth daily. 04/03/16  Yes Historical Provider, MD     Family History  Problem Relation Age of Onset  . Heart disease Mother     . Heart disease Father   . Heart disease Brother     Social History   Social History  . Marital status: Married    Spouse name: N/A  . Number of children: N/A  . Years of education: N/A   Social History Main Topics  . Smoking status: Former Research scientist (life sciences)  . Smokeless tobacco: Never Used  . Alcohol use No  . Drug use: No  . Sexual activity: Not Asked   Other Topics Concern  . None   Social History Narrative  . None     Review of Systems  Constitutional: Positive for chills. Negative for activity change and fever.  Respiratory: Positive for shortness of breath.   Cardiovascular: Positive for chest pain.  Gastrointestinal: Positive for abdominal pain. Negative for blood in stool.  Genitourinary: Negative.     Vital Signs: BP (!) 145/61   Pulse 95   Temp 98.7 F (37.1 C) (Oral)   Resp 15   Ht 5\' 7"  (1.702 m)   Wt 158 lb (71.7 kg)   SpO2 97%   BMI 24.75 kg/m   Physical Exam  Constitutional: She appears well-developed.  Cardiovascular: Normal rate, regular rhythm and normal heart sounds.   Pulmonary/Chest: Effort normal. No respiratory distress. She has no wheezes. She has rales.  Abdominal: Soft. Bowel sounds are normal. She exhibits no distension.    Mallampati Score:  MD Evaluation Airway: WNL Heart: WNL Abdomen: WNL Chest/ Lungs: WNL ASA  Classification: 2 Mallampati/Airway Score: Two  Imaging: Ct Abdomen Pelvis W Contrast  Result Date: 07/26/2016 CLINICAL DATA:  Left lower quadrant  pain with diarrhea x 11 days EXAM: CT ABDOMEN AND PELVIS WITH CONTRAST TECHNIQUE: Multidetector CT imaging of the abdomen and pelvis was performed using the standard protocol following bolus administration of intravenous contrast. CONTRAST:  144mL ISOVUE-300 IOPAMIDOL (ISOVUE-300) INJECTION 61% COMPARISON:  11/27/2005 upper GI with small bowel series FINDINGS: Lower chest: Small hiatal hernia. Subpleural areas of fibrosis and bibasilar scarring. Moderate-sized hiatal hernia. Top  the visualized cardiac chambers are within normal limits. No pericardial effusion. Hepatobiliary: Nonenhancing hypodensities are noted within the left and right hepatic lobes the largest is in the left hepatic lobe measuring up to 7 mm. Findings are in keeping with cysts but are too small to further characterize. No enhancement on delayed images. Pancreas: Unremarkable.  No ductal dilatation. Spleen: No splenomegaly. Adrenals/Urinary Tract: Normal bilateral adrenal glands. The kidneys demonstrate no obstructive uropathy or enhancing mass. Stomach/Bowel: Normal bowel rotation. No bowel obstruction. No bowel fold thickening, wall thickening or inflammation. Normal-appearing appendix. Vascular/Lymphatic: There is a bulky retroperitoneal mass in the upper abdomen measuring 8 x 9.3 x 10.6 cm in craniocaudad by AP by transverse dimension outlining the aorta, displacing the IVC laterally to the right and enveloping the renal arteries and veins. There is a mild to moderate engorgement of the left renal vein from extrinsic mass effect as well as on the right renal vein. This upper margin of this mass starts at the SMA without involving the SMA with its caudal margin approximate 3.8 cm above the aortic bifurcation. Small subcentimeter porta hepatic and peripancreatic lymph nodes on the order of 7 mm or less are noted. No pelvic lymphadenopathy identified. The aorta is atherosclerotic without aneurysm or dissection. Reproductive: No adnexal mass. The patient appears to be status post hysterectomy. Other: No ascites or free air. Musculoskeletal: Degenerative disc disease L4-5 and L5-S1. Schmorl's node involving the T12 superior endplate. No frank bone destruction or acute fracture. IMPRESSION: Bulky retroperitoneal lymphadenopathy in the upper abdomen measuring 8 x 9.3 x 10.6 cm enveloping the aorta and both renal arteries and displacing the IVC and rena with l veins bilaterally. Findings would be in keeping with lymphoma. No  splenomegaly. Hysterectomy. Moderate-sized hiatal hernia. Nonenhancing sub cm hepatic hypodensities likely representing cysts. These results will be called to the ordering clinician or representative by the Radiologist Assistant, and communication documented in the PACS or zVision Dashboard. Electronically Signed   By: Ashley Royalty M.D.   On: 07/26/2016 13:43   Nm Pet Image Initial (pi) Skull Base To Thigh  Result Date: 08/03/2016 CLINICAL DATA:  Initial treatment strategy for lymphoma. EXAM: NUCLEAR MEDICINE PET SKULL BASE TO THIGH TECHNIQUE: 12.1 mCi F-18 FDG was injected intravenously. Full-ring PET imaging was performed from the skull base to thigh after the radiotracer. CT data was obtained and used for attenuation correction and anatomic localization. FASTING BLOOD GLUCOSE:  Value: 111 mg/dl COMPARISON:  AP CT on 07/26/2016 FINDINGS: NECK No hypermetabolic lymph nodes in the neck. CHEST No hypermetabolic mediastinal or hilar nodes. No suspicious pulmonary nodules on the CT scan. Chronic interstitial lung disease again noted. Small to moderate hiatal hernia is stable. ABDOMEN/PELVIS No abnormal hypermetabolic activity within the liver, pancreas, adrenal glands, or spleen. Bulky abdominal retroperitoneal lymphadenopathy measures 9.4 x 10.6 cm, without change compared to recent study. This is hypermetabolic with SUV max of [id number]. Hypermetabolic pelvic lymphadenopathy is seen left internal iliac chain, measuring 2.2 cm in short axis on image 201/3. This is hypermetabolic, with SUV max of 18.6. Descending colon diverticulosis noted, without evidence of  diverticulitis. Previous hysterectomy. Aortic atherosclerosis. SKELETON No focal hypermetabolic activity to suggest skeletal metastasis. IMPRESSION: Bulky hypermetabolic abdominal retroperitoneal lymphadenopathy, and mild hypermetabolic left pelvic internal iliac lymphadenopathy, consistent with lymphoma. No hypermetabolic lymphadenopathy within the chest or neck.  Incidental findings include chronic interstitial lung disease, hiatal hernia, diverticulosis, and aortic atherosclerosis. Electronically Signed   By: Earle Gell M.D.   On: 08/03/2016 14:43    Labs:  CBC:  Recent Labs  07/31/16 0947  WBC 7.7  HGB 13.5  HCT 40.6  PLT 273    COAGS:  Recent Labs  07/31/16 0947  INR 0.92  APTT 27    BMP:  Recent Labs  07/31/16 0947  NA 136  K 3.7  CL 100*  CO2 25  GLUCOSE 113*  BUN 17  CALCIUM 9.3  CREATININE 0.54  GFRNONAA >60  GFRAA >60    LIVER FUNCTION TESTS:  Recent Labs  07/31/16 0947  BILITOT 0.7  AST 17  ALT 10*  ALKPHOS 52  PROT 8.1  ALBUMIN 4.4    TUMOR MARKERS: No results for input(s): AFPTM, CEA, CA199, CHROMGRNA in the last 8760 hours.  Assessment and Plan:  70 year old female with extensive retroperitoneal lymphadenopathy and needs a tissue diagnosis. Discussed CT-guided biopsy of the retroperitoneal soft tissue. The risks which include bleeding and infection. Patient has a good understanding of the procedure and informed consent was obtained. Plan for CT guided core biopsy of the retroperitoneal adenopathy with moderate sedation.  Thank you for this interesting consult.  I greatly enjoyed meeting Rose [place] and look forward to participating in their care.  A copy of this report was sent to the requesting provider on this date.  Electronically Signed: Carylon Perches 08/08/2016, 12:36 PM   I spent a total of  15 Minutes   in face to face in clinical consultation, greater than 50% of which was counseling/coordinating care for CT-guided biopsy.

## 2016-08-08 NOTE — Procedures (Signed)
CT guided core biopsies of left retroperitoneal lymphadenopathy.  7 cores obtained.  Minimal bleeding and no immediate complication.

## 2016-08-10 ENCOUNTER — Other Ambulatory Visit: Payer: Self-pay | Admitting: Oncology

## 2016-08-10 ENCOUNTER — Other Ambulatory Visit: Payer: Self-pay | Admitting: *Deleted

## 2016-08-10 DIAGNOSIS — C8333 Diffuse large B-cell lymphoma, intra-abdominal lymph nodes: Secondary | ICD-10-CM

## 2016-08-10 DIAGNOSIS — C8590 Non-Hodgkin lymphoma, unspecified, unspecified site: Secondary | ICD-10-CM

## 2016-08-14 ENCOUNTER — Telehealth: Payer: Self-pay | Admitting: Oncology

## 2016-08-14 ENCOUNTER — Telehealth: Payer: Self-pay | Admitting: *Deleted

## 2016-08-14 NOTE — Telephone Encounter (Signed)
Dr.Rao called pt and told her the pathology report and she has appt Thursday to see her.  Wanted to get started with chemo teaching, port, echo , BM bx.  Ihave ECHO 12/27 arrive 9:45 but no answer when I call her phone and left message. Will try later this evening also.  BM bx sch[facility] 08/21/16 [facility]  Need to know if pt has preference for port placement  With a specific md.  Also chemo teaching we can try next week/.

## 2016-08-14 NOTE — Telephone Encounter (Signed)
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 I explained to her that I will discuss all this in greater detail during her appointment with me on 08/16/16. I have also dicussed her case with her primary oncologist Dr. Rogue Bussing who would like to get started with treatment asap. Hence we will plan on setting up bone marrow biopsy, port placement and echo asap in anticipation of starting treatment. Patient understands and agrees to proceed with testing as planned.  Dr. Randa Evens, MD, MPH [place] [facility] [phone] 08/14/2016 1:04 PM

## 2016-08-15 ENCOUNTER — Ambulatory Visit: Payer: Medicare Other

## 2016-08-16 ENCOUNTER — Inpatient Hospital Stay: Payer: Medicare Other | Admitting: Oncology

## 2016-08-16 ENCOUNTER — Encounter: Payer: Self-pay | Admitting: Emergency Medicine

## 2016-08-16 ENCOUNTER — Inpatient Hospital Stay (HOSPITAL_BASED_OUTPATIENT_CLINIC_OR_DEPARTMENT_OTHER): Payer: Medicare Other | Admitting: Oncology

## 2016-08-16 ENCOUNTER — Inpatient Hospital Stay: Payer: Medicare Other

## 2016-08-16 ENCOUNTER — Emergency Department
Admission: EM | Admit: 2016-08-16 | Discharge: 2016-08-16 | Disposition: A | Discharge status: Home / Self Care | Payer: Medicare Other | Attending: Emergency Medicine | Admitting: Emergency Medicine

## 2016-08-16 ENCOUNTER — Telehealth: Payer: Self-pay | Admitting: *Deleted

## 2016-08-16 ENCOUNTER — Other Ambulatory Visit: Payer: Self-pay | Admitting: *Deleted

## 2016-08-16 VITALS — BP 148/82 | HR 91 | Temp 98.4°F | Resp 20 | Ht 67.0 in | Wt 156.5 lb

## 2016-08-16 DIAGNOSIS — R61 Generalized hyperhidrosis: Secondary | ICD-10-CM

## 2016-08-16 DIAGNOSIS — I10 Essential (primary) hypertension: Secondary | ICD-10-CM

## 2016-08-16 DIAGNOSIS — R109 Unspecified abdominal pain: Secondary | ICD-10-CM

## 2016-08-16 DIAGNOSIS — Z7984 Long term (current) use of oral hypoglycemic drugs: Secondary | ICD-10-CM

## 2016-08-16 DIAGNOSIS — R1032 Left lower quadrant pain: Secondary | ICD-10-CM | POA: Diagnosis not present

## 2016-08-16 DIAGNOSIS — Z79899 Other long term (current) drug therapy: Secondary | ICD-10-CM

## 2016-08-16 DIAGNOSIS — Z87891 Personal history of nicotine dependence: Secondary | ICD-10-CM | POA: Diagnosis not present

## 2016-08-16 DIAGNOSIS — R197 Diarrhea, unspecified: Secondary | ICD-10-CM

## 2016-08-16 DIAGNOSIS — Z8669 Personal history of other diseases of the nervous system and sense organs: Secondary | ICD-10-CM

## 2016-08-16 DIAGNOSIS — I700 Atherosclerosis of aorta: Secondary | ICD-10-CM

## 2016-08-16 DIAGNOSIS — J849 Interstitial pulmonary disease, unspecified: Secondary | ICD-10-CM

## 2016-08-16 DIAGNOSIS — C8330 Diffuse large B-cell lymphoma, unspecified site: Secondary | ICD-10-CM

## 2016-08-16 DIAGNOSIS — R590 Localized enlarged lymph nodes: Secondary | ICD-10-CM

## 2016-08-16 DIAGNOSIS — E119 Type 2 diabetes mellitus without complications: Secondary | ICD-10-CM | POA: Insufficient documentation

## 2016-08-16 DIAGNOSIS — C8333 Diffuse large B-cell lymphoma, intra-abdominal lymph nodes: Secondary | ICD-10-CM

## 2016-08-16 DIAGNOSIS — M5136 Other intervertebral disc degeneration, lumbar region: Secondary | ICD-10-CM

## 2016-08-16 DIAGNOSIS — E7800 Pure hypercholesterolemia, unspecified: Secondary | ICD-10-CM

## 2016-08-16 DIAGNOSIS — R102 Pelvic and perineal pain: Secondary | ICD-10-CM | POA: Diagnosis not present

## 2016-08-16 DIAGNOSIS — K449 Diaphragmatic hernia without obstruction or gangrene: Secondary | ICD-10-CM

## 2016-08-16 LAB — CBC WITH DIFFERENTIAL/PLATELET
BASOS ABS: 0.1 10*3/uL (ref 0–0.1)
BASOS PCT: 1 %
EOS ABS: 0.2 10*3/uL (ref 0–0.7)
Eosinophils Relative: 4 %
HCT: 38.4 % (ref 35.0–47.0)
HEMOGLOBIN: 13.1 g/dL (ref 12.0–16.0)
Lymphocytes Relative: 24 %
Lymphs Abs: 1.4 10*3/uL (ref 1.0–3.6)
MCH: 28.6 pg (ref 26.0–34.0)
MCHC: 34.1 g/dL (ref 32.0–36.0)
MCV: 83.7 fL (ref 80.0–100.0)
MONOS PCT: 11 %
Monocytes Absolute: 0.6 10*3/uL (ref 0.2–0.9)
NEUTROS ABS: 3.4 10*3/uL (ref 1.4–6.5)
NEUTROS PCT: 60 %
Platelets: 211 10*3/uL (ref 150–440)
RBC: 4.58 MIL/uL (ref 3.80–5.20)
RDW: 14.2 % (ref 11.5–14.5)
WBC: 5.6 10*3/uL (ref 3.6–11.0)

## 2016-08-16 LAB — URINALYSIS, COMPLETE (UACMP) WITH MICROSCOPIC
Bacteria, UA: NONE SEEN
Bilirubin Urine: NEGATIVE
GLUCOSE, UA: NEGATIVE mg/dL
HGB URINE DIPSTICK: NEGATIVE
Ketones, ur: 20 mg/dL — AB
Nitrite: NEGATIVE
PROTEIN: 30 mg/dL — AB
SPECIFIC GRAVITY, URINE: 1.026 (ref 1.005–1.030)
pH: 5 (ref 5.0–8.0)

## 2016-08-16 LAB — PHOSPHORUS: Phosphorus: 3 mg/dL (ref 2.5–4.6)

## 2016-08-16 LAB — URIC ACID: Uric Acid, Serum: 4.5 mg/dL (ref 2.3–6.6)

## 2016-08-16 LAB — BASIC METABOLIC PANEL
ANION GAP: 9 (ref 5–15)
BUN: 14 mg/dL (ref 6–20)
CALCIUM: 9.3 mg/dL (ref 8.9–10.3)
CO2: 25 mmol/L (ref 22–32)
CREATININE: 0.65 mg/dL (ref 0.44–1.00)
Chloride: 103 mmol/L (ref 101–111)
Glucose, Bld: 118 mg/dL — ABNORMAL HIGH (ref 65–99)
Potassium: 3.6 mmol/L (ref 3.5–5.1)
SODIUM: 137 mmol/L (ref 135–145)

## 2016-08-16 LAB — LACTATE DEHYDROGENASE: LDH: 213 U/L — ABNORMAL HIGH (ref 98–192)

## 2016-08-16 MED ORDER — OXYCODONE HCL 5 MG PO TABS: 2.5000 mg | ORAL_TABLET | Freq: Four times a day (QID) | ORAL | 0 refills | Status: DC | PRN

## 2016-08-16 MED ORDER — LIDOCAINE-PRILOCAINE 2.5-2.5 % EX CREA: TOPICAL_CREAM | CUTANEOUS | 1 refills | Status: DC

## 2016-08-16 MED ORDER — PREDNISONE 50 MG PO TABS: 100.0000 mg | ORAL_TABLET | Freq: Every day | ORAL | 0 refills | Status: DC

## 2016-08-16 MED ORDER — ALLOPURINOL 300 MG PO TABS: 300.0000 mg | ORAL_TABLET | Freq: Every day | ORAL | 0 refills | Status: DC

## 2016-08-16 NOTE — Telephone Encounter (Signed)
Called pt to confirm that pt did have ECHO scheduled for tom. And I did not know that it had already been rescheduled.  [facility] 9[facility].  Then 1/2 bone marrow bx and I already gave her a sheet with date and time and instructions.  I have asked vascular to put port in on wed 1/3 but at this time I do not have it confirmed.  Then she is schedule for chemo education class 1/4 at 9 am [facility] and just check at front desk to see where the location will be.  I told pt that if everything goes well then she may start treatment next Friday 1/5.  Pt knows these above appt and the probability of the port placement and chemo start date and I will contact her as soon as I get confirmation. She is agreeable to the plan

## 2016-08-16 NOTE — Progress Notes (Signed)
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 Bulky disease  Chief complaint/ Reason for visit-  Discuss results of scans and pathology findings  Heme/Onc history: Patient is a 70 year old female with a history of idiopathic interstitial lung disease. She was seen by Dr. Rogue Bussing in December 2017 after she presented with worsening abdominal pain that has been ongoing for the last few weeks and progressively getting worse.she had one episode of diarrhea in the past which lasted for about 3 days and then it stopped. Patient has had chronic sweats but does not report any drenching night sweats. Appetite has been normal and denies any unintentional weight loss.  Interval history- patient had a sudden bout of acute abdominal pain which prompted her to go to the ER today. She was discharged on when necessary oxycodone  ECOG PS- 1 Pain scale- 3 Opioid associted constipation- n/a  Review of  systems- Review of Systems  Constitutional: Negative for chills, fever, malaise/fatigue and weight loss.  HENT: Negative for congestion, ear discharge and nosebleeds.   Eyes: Negative for blurred vision.  Respiratory: Negative for cough, hemoptysis, sputum production, shortness of breath and wheezing.   Cardiovascular: Negative for chest pain, palpitations, orthopnea and claudication.  Gastrointestinal: Negative for abdominal pain, blood in stool, constipation, diarrhea, heartburn, melena, nausea and vomiting.  Genitourinary: Negative for dysuria, flank pain, frequency, hematuria and urgency.  Musculoskeletal: Negative for back pain, joint pain and  myalgias.  Skin: Negative for rash.  Neurological: Negative for dizziness, tingling, focal weakness, seizures, weakness and headaches.  Endo/Heme/Allergies: Does not bruise/bleed easily.  Psychiatric/Behavioral: Negative for depression and suicidal ideas. The patient does not have insomnia.      Current treatment- yet to start   Allergies  Allergen Reactions  . Cefuroxime Axetil Diarrhea    Other reaction(s): Abdominal Pain  . Doxycycline     Other reaction(s): Abdominal Pain severe     Past Medical History:  Diagnosis Date  . Diabetes mellitus without complication ([place])   . History of gastric ulcer   . Hypercholesterolemia   . Hypertension   . ILD (interstitial lung disease) ([place])    8 yrs ago  . Lymphadenopathy      Past Surgical History:  Procedure Laterality Date  . BREAST BIOPSY Left 2010   neg  . PARTIAL HYSTERECTOMY     age 82    Social History   Social History  . Marital status: Married    Spouse name: N/A  . Number of children: N/A  . Years of education: N/A   Occupational History  . Not on file.   Social History Main Topics  . Smoking status: Former Research scientist (life sciences)  . Smokeless tobacco: Never Used  . Alcohol use No  . Drug use: No  . Sexual activity: Not on file   Other Topics Concern  . Not on file   Social History Narrative  . No narrative on file    Family History  Problem Relation Age of Onset  . Heart disease Mother   . Heart disease Father   . Heart disease Brother      Current Outpatient Prescriptions:  .  latanoprost (XALATAN) 0.005 %  ophthalmic solution, Place 1 drop into both eyes Nightly., Disp: , Rfl:  .  losartan-hydrochlorothiazide (HYZAAR) 100-12.5 MG tablet, Take 1 tablet by mouth daily., Disp: , Rfl:  .  metFORMIN (GLUCOPHAGE) 500 MG tablet, Take 1 tablet by mouth daily., Disp: , Rfl:  .  pantoprazole (PROTONIX) 40 MG tablet, Take 1 tablet by mouth 2 (two) times daily., Disp: , Rfl:  .  pravastatin (PRAVACHOL) 40 MG  tablet, Take 1 tablet by mouth daily., Disp: , Rfl:  .  oxyCODONE (ROXICODONE) 5 MG immediate release tablet, Take 0.5 tablets (2.5 mg total) by mouth every 6 (six) hours as needed for breakthrough pain., Disp: 12 tablet, Rfl: 0  Physical exam:  Vitals:   08/16/16 1126  BP: (!) 148/82  Pulse: 91  Resp: 20  Temp: 98.4 F (36.9 C)  TempSrc: Tympanic  Weight: 156 lb 8.4 oz (71 kg)  Height: _0  (1.702 m)   Physical Exam  Constitutional: She is oriented to person, place, and time and well-developed, well-nourished, and in no distress.  HENT:  Head: Normocephalic and atraumatic.  Eyes: EOM are normal. Pupils are equal, round, and reactive to light.  Neck: Normal range of motion.  Cardiovascular: Normal rate, regular rhythm and normal heart sounds.   Pulmonary/Chest: Effort normal and breath sounds normal.  Abdominal: Soft. Bowel sounds are normal.  Lymphadenopathy:  No evidence of palpable cervical axillary or inguinal adenopathy  Neurological: She is alert and oriented to person, place, and time.  Skin: Skin is warm and dry.     CMP Latest Ref Rng & Units 08/16/2016  Glucose 65 - 99 mg/dL 118(H)  BUN 6 - 20 mg/dL 14  Creatinine 0.44 - 1.00 mg/dL 0.65  Sodium 135 - 145 mmol/L 137  Potassium 3.5 - 5.1 mmol/L 3.6  Chloride 101 - 111 mmol/L 103  CO2 22 - 32 mmol/L 25  Calcium 8.9 - 10.3 mg/dL 9.3  Total Protein 6.5 - 8.1 g/dL -  Total Bilirubin 0.3 - 1.2 mg/dL -  Alkaline Phos 38 - 126 U/L -  AST 15 - 41 U/L -  ALT 14 - 54 U/L -   CBC Latest Ref Rng & Units 08/16/2016  WBC 3.6 - 11.0 K/uL 5.6  Hemoglobin 12.0 - 16.0 g/dL 13.1  Hematocrit 35.0 - 47.0 % 38.4  Platelets 150 - 440 K/uL 211    No images are attached to the encounter.  Ct Abdomen Pelvis W Contrast  Result Date: 07/26/2016 CLINICAL DATA:  Left lower quadrant pain with diarrhea x 11 days EXAM: CT ABDOMEN AND PELVIS WITH CONTRAST TECHNIQUE: Multidetector CT imaging of the abdomen and pelvis was performed using  the standard protocol following bolus administration of intravenous contrast. CONTRAST:  123m ISOVUE-300 IOPAMIDOL (ISOVUE-300) INJECTION 61% COMPARISON:  11/27/2005 upper GI with small bowel series FINDINGS: Lower chest: Small hiatal hernia. Subpleural areas of fibrosis and bibasilar scarring. Moderate-sized hiatal hernia. Top the visualized cardiac chambers are within normal limits. No pericardial effusion. Hepatobiliary: Nonenhancing hypodensities are noted within the left and right hepatic lobes the largest is in the left hepatic lobe measuring up to 7 mm. Findings are in keeping with cysts but are too small to further characterize. No enhancement on delayed images. Pancreas: Unremarkable.  No ductal dilatation. Spleen: No splenomegaly. Adrenals/Urinary Tract: Normal bilateral adrenal glands. The kidneys demonstrate no obstructive uropathy or enhancing mass. Stomach/Bowel: Normal bowel rotation. No bowel obstruction. No bowel fold thickening, wall thickening or inflammation. Normal-appearing appendix. Vascular/Lymphatic: There is a bulky retroperitoneal  mass in the upper abdomen measuring 8 x 9.3 x 10.6 cm in craniocaudad by AP by transverse dimension outlining the aorta, displacing the IVC laterally to the right and enveloping the renal arteries and veins. There is a mild to moderate engorgement of the left renal vein from extrinsic mass effect as well as on the right renal vein. This upper margin of this mass starts at the SMA without involving the SMA with its caudal margin approximate 3.8 cm above the aortic bifurcation. Small subcentimeter porta hepatic and peripancreatic lymph nodes on the order of 7 mm or less are noted. No pelvic lymphadenopathy identified. The aorta is atherosclerotic without aneurysm or dissection. Reproductive: No adnexal mass. The patient appears to be status post hysterectomy. Other: No ascites or free air. Musculoskeletal: Degenerative disc disease L4-5 and L5-S1. Schmorl's node  involving the T12 superior endplate. No frank bone destruction or acute fracture. IMPRESSION: Bulky retroperitoneal lymphadenopathy in the upper abdomen measuring 8 x 9.3 x 10.6 cm enveloping the aorta and both renal arteries and displacing the IVC and rena with l veins bilaterally. Findings would be in keeping with lymphoma. No splenomegaly. Hysterectomy. Moderate-sized hiatal hernia. Nonenhancing sub cm hepatic hypodensities likely representing cysts. These results will be called to the ordering clinician or representative by the Radiologist Assistant, and communication documented in the PACS or zVision Dashboard. Electronically Signed   By: Ashley Royalty M.D.   On: 07/26/2016 13:43   Nm Pet Image Initial (pi) Skull Base To Thigh  Result Date: 08/03/2016 CLINICAL DATA:  Initial treatment strategy for lymphoma. EXAM: NUCLEAR MEDICINE PET SKULL BASE TO THIGH TECHNIQUE: 12.1 mCi F-18 FDG was injected intravenously. Full-ring PET imaging was performed from the skull base to thigh after the radiotracer. CT data was obtained and used for attenuation correction and anatomic localization. FASTING BLOOD GLUCOSE:  Value: 111 mg/dl COMPARISON:  AP CT on 07/26/2016 FINDINGS: NECK No hypermetabolic lymph nodes in the neck. CHEST No hypermetabolic mediastinal or hilar nodes. No suspicious pulmonary nodules on the CT scan. Chronic interstitial lung disease again noted. Small to moderate hiatal hernia is stable. ABDOMEN/PELVIS No abnormal hypermetabolic activity within the liver, pancreas, adrenal glands, or spleen. Bulky abdominal retroperitoneal lymphadenopathy measures 9.4 x 10.6 cm, without change compared to recent study. This is hypermetabolic with SUV max of 28.0. Hypermetabolic pelvic lymphadenopathy is seen left internal iliac chain, measuring 2.2 cm in short axis on image 201/3. This is hypermetabolic, with SUV max of 18.6. Descending colon diverticulosis noted, without evidence of diverticulitis. Previous  hysterectomy. Aortic atherosclerosis. SKELETON No focal hypermetabolic activity to suggest skeletal metastasis. IMPRESSION: Bulky hypermetabolic abdominal retroperitoneal lymphadenopathy, and mild hypermetabolic left pelvic internal iliac lymphadenopathy, consistent with lymphoma. No hypermetabolic lymphadenopathy within the chest or neck. Incidental findings include chronic interstitial lung disease, hiatal hernia, diverticulosis, and aortic atherosclerosis. Electronically Signed   By: Earle Gell M.D.   On: 08/03/2016 14:43   Ct Biopsy  Result Date: 08/08/2016 INDICATION: 70 year-old with the massive retroperitoneal lymphadenopathy and needs a tissue diagnosis. EXAM: CT-GUIDED RETROPERITONEAL LYMPH NODE BIOPSY MEDICATIONS: None. ANESTHESIA/SEDATION: Moderate (conscious) sedation was employed during this procedure. A total of Versed 1.0 mg and Fentanyl 50 mcg was administered intravenously. Moderate Sedation Time: 28 minutes. The patient's level of consciousness and vital signs were monitored continuously by radiology nursing throughout the procedure under my direct supervision. FLUOROSCOPY TIME:  None COMPLICATIONS: None immediate. PROCEDURE: Informed written consent was obtained from the patient after a thorough discussion of the procedural risks, benefits and  alternatives. All questions were addressed. Maximal Sterile Barrier Technique was utilized including caps, mask, sterile gowns, sterile gloves, sterile drape, hand hygiene and skin antiseptic. A timeout was performed prior to the initiation of the procedure. Patient was placed prone and CT images of the abdomen were obtained. Left flank was prepped and draped in sterile fashion. Skin and soft tissues were anesthetized with 1% lidocaine. 72 gauge coaxial needle directed into the large retroperitoneal soft tissue mass with CT guidance from a left paraspinal approach. Needle position was confirmed along the posterior aspect of the mass. A total of 7 core  biopsies were obtained with an 18 gauge device. Specimens placed on the Telfa pad with saline. Needle was removed without complication. Bandage placed over the puncture site. FINDINGS: Again noted is massive amount of soft tissue in the retroperitoneum compatible with extensive lymphadenopathy. Needle was positioned in the left posterior aspect of this retroperitoneal mass. No significant bleeding following the core biopsies. IMPRESSION: Successful CT-guided core biopsies of the retroperitoneal lymphadenopathy/mass. Electronically Signed   By: Markus Daft M.D.   On: 08/08/2016 17:27   PATH REPORT:   DIAGNOSIS:  A. LYMPH NODE, RETROPERITONEAL; CT-GUIDED CORE BIOPSY:  - LARGE B-CELL LYMPHOMA, SEE COMMENT.   Comment:  There is a diffuse proliferation of large lymphoid cells with visible  nucleoli, slightly irregular nuclear contours, and moderate amounts of  cytoplasm. Immunohistochemistry (IHC) was performed for classification.  The neoplastic cells are diffusely positive for CD20, CD10, BCL2, and  BCL6. MYC is positive (90% of cells with nuclear staining). Ki67  proliferation rate is approximately 60%. The neoplastic cells are  negative for Cyclin D1, CD30 and MUM-1, and negative for EBV by  EBER-ISH. CD3 and CD5 stain background T cells. CD138 stains a few  plasma cells. Pancytokeratin (AE1/AE3/PCK26) is negative.   The morphology and IHC profile are consistent with diffuse large B-cell  lymphoma of germinal center B-cell type, with double expression of MYC  and BCL2 by IHC. FISH for MYC, BCL2, and BCL6 gene rearrangements is  pending at the time of this report. The FISH result and final lymphoma  classification will be reported in an addendum. Preliminary histology  and IHC results were discussed with Dr. Janese Banks on 08/10/16.   Assessment and plan- Patient is a 70 y.o. female with newly diagnosed at least stage II diffuse large B-cell lymphoma double expresser-type, fish for double hit status  is pending with bulky disease who is here to discuss her treatment options   1. I discussed the results of the PET/CT as well as the pathology findings with the patient and her husband in detail. I explained to her that diffuse large B-cell lymphoma is typically treated with chemotherapy. At this time we do not have the results of double hit status which may change management. However I have discussed her case with Dr. Rogue Bussing who is her primary oncologist and the plan is to proceed with first cycle of R CHOP at full dose after her other baseline workup is completed.. If she were to have double hit lymphoma, we may have to change chemotherapy to dose adjusted Pisek based on her tolerance to cycle #1 of chemotherapy. Prior to starting chemotherapy she will need port placement as well as bone marrow biopsy to complete staging.  I did explain to her that if her bone marrow biopsy is positive this would make it a stage IV diffuse large B-cell lymphoma. However that would not change management. She would need IV R CHOP  every 3 weeks 6 cycles regardless because she has bulky disease. We will also need to check hepatitis panel prior to giving Rituxan.  2.  I went over the risks and benefits of chemotherapy including all but not limited to fatigue, nausea, vomiting, risk of infections and hair loss. Patient understands and agrees to proceed as planned. There is a risk of cardiotoxicity associated with Adriamycin and we will be checking a baseline echo prior to giving Adriamycin. We anticipate to start chemotherapy within the next 1 week if all the workup can be completed prior to that. She will see Dr. Rogue Bussing one week after her cycle 1 of R CHOP with CBC and CMP to discuss further plans and also to assess her tolerance to cycle 1 of chemotherapy  3. Given that she is symptomatic from her bulky intra-abdominal adenopathy- she will continue when necessary oxycodone but I will get started with prednisone 100 mg  5 days before starting with chemotherapy. She will not get prednisone with cycle 1 of RCHOP.  4. Patient does not have any laboratory evidence of tumor lysis syndrome. However I will start her on prophylactic allopurinol 300 mg once daily.  5. Neoplasm associated abdominal pain- we will make a follow-up phone call next week and refill of her oxycodone if need be       Total face to face encounter time for this patient visit was 30 min. >50% of the time was  spent in counseling and coordination of care.     Visit Diagnosis 1. Diffuse large B-cell lymphoma, unspecified body region [facility])      Dr. Randa Evens, MD, MPH [place] at [facility] Pager- [phone]

## 2016-08-16 NOTE — Progress Notes (Signed)
Patient evaluated in ED this morning for left lower quad pain that radiates to left groin. Pt reports that her pain has "eased off since this morning and currently rates pain 2/10." pt c/o fatigue.

## 2016-08-16 NOTE — ED Triage Notes (Signed)
Pt c/o left lower pelvic pain with burning started last night. Pt with hx of lymphoma.

## 2016-08-16 NOTE — ED Notes (Signed)
Pt alert and oriented X4, active, cooperative, pt in NAD. RR even and unlabored, color WNL.  Pt informed to return if any life threatening symptoms occur.  Pt wheeled out to car.

## 2016-08-16 NOTE — Discharge Instructions (Signed)
Your labs today were unremarkable. Follow up with Dr. Rogue Bussing at your appointment today for further treatment planning.

## 2016-08-16 NOTE — ED Notes (Signed)
Pt instructed to go to cancer center for appt.

## 2016-08-16 NOTE — ED Provider Notes (Signed)
[facility] Emergency Department Provider Note  ____________________________________________  Time seen: Approximately 10:43 AM  I have reviewed the triage vital signs and the nursing notes.   HISTORY  Chief Complaint Pelvic Pain    HPI Rose Hill is a 70 y.o. female who complains of intermittent burning pain in the left lower quadrant of the abdomen for the past 2 days. No diarrhea or constipation, no dysuria frequency urgency nausea or vomiting. No fevers or chills. Nonradiating. No aggravating or alleviating factors.  She was recently diagnosed with lymphoma, had a PET scan showing bulky retroperitoneal lymphadenopathy as well as some left internal iliac lymphadenopathy. She is following up with Dr. Rogue Bussing today for treatment planning.     Past Medical History:  Diagnosis Date  . Diabetes mellitus without complication ([place])   . History of gastric ulcer   . Hypercholesterolemia   . Hypertension   . ILD (interstitial lung disease) ([place])    8 yrs ago  . Lymphadenopathy      Patient Active Problem List   Diagnosis Date Noted  . Abdominal mass 07/31/2016  . Retroperitoneal lymphadenopathy 07/31/2016  . DM 10/20/2008  . HYPERLIPIDEMIA 10/20/2008  . OBSTRUCTIVE SLEEP APNEA 10/20/2008  . HYPERTENSION 10/20/2008  . ALLERGIC RHINITIS 10/20/2008  . 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 BREAST BIOPSY Left 2010   neg  . PARTIAL HYSTERECTOMY     age 70     Prior to Admission medications   Medication Sig Start Date End Date Taking? Authorizing Provider  latanoprost (XALATAN) 0.005 % ophthalmic solution Place 1 drop into both eyes Nightly. 01/21/16   Historical Provider, MD  losartan-hydrochlorothiazide (HYZAAR) 100-12.5 MG tablet Take 1 tablet by mouth daily. 04/03/16   Historical Provider, MD  metFORMIN (GLUCOPHAGE) 500 MG tablet Take 1 tablet by mouth daily. 04/03/16   Historical Provider, MD   pantoprazole (PROTONIX) 40 MG tablet Take 1 tablet by mouth 2 (two) times daily. 07/19/16   Historical Provider, MD  pravastatin (PRAVACHOL) 40 MG tablet Take 1 tablet by mouth daily. 04/03/16   Historical Provider, MD     Allergies Cefuroxime axetil and Doxycycline   Family History  Problem Relation Age of Onset  . Heart disease Mother   . Heart disease Father   . Heart disease Brother     Social History Social History  Substance Use Topics  . Smoking status: Former Research scientist (life sciences)  . Smokeless tobacco: Never Used  . Alcohol use No    Review of Systems  Constitutional:   No fever or chills.  ENT:   No sore throat. No rhinorrhea. Cardiovascular:   No chest pain. Respiratory:   No dyspnea or cough. Gastrointestinal:   Left lower quadrant abdominal pain as above without vomiting and diarrhea.  Genitourinary:   Negative for dysuria or difficulty urinating. Musculoskeletal:   Negative for focal pain or swelling Neurological:   Negative for headaches 10-point ROS otherwise negative.  ____________________________________________   PHYSICAL EXAM:  VITAL SIGNS: ED Triage Vitals [08/16/16 0843]  Enc Vitals Group     BP (!) 159/73     Pulse Rate 84     Resp 20     Temp 97.6 F (36.4 C)     Temp Source Oral     SpO2 97 %     Weight 158 lb (71.7 kg)     Height 5\' 7"  (1.702 m)     Head Circumference  Peak Flow      Pain Score 9     Pain Loc      Pain Edu?      Excl. in [place]?     Vital signs reviewed, nursing assessments reviewed.   Constitutional:   Alert and oriented. Well appearing and in no distress. Eyes:   No scleral icterus. No conjunctival pallor. PERRL. EOMI.  No nystagmus. ENT   Head:   Normocephalic and atraumatic.   Nose:   No congestion/rhinnorhea. No septal hematoma   Mouth/Throat:   MMM, no pharyngeal erythema. No peritonsillar mass.    Neck:   No stridor. No SubQ emphysema. No meningismus. Hematological/Lymphatic/Immunilogical:   No  cervical lymphadenopathy. Cardiovascular:   RRR. Symmetric bilateral radial and DP pulses.  No murmurs.  Respiratory:   Normal respiratory effort without tachypnea nor retractions. Breath sounds are clear and equal bilaterally. No wheezes/rales/rhonchi. Gastrointestinal:   Soft With mild left lower quadrant tenderness. Also tenderness along the inguinal ligament on the left reproducing her symptoms.. Non distended. There is no CVA tenderness.  No rebound, rigidity, or guarding. Genitourinary:   deferred Musculoskeletal:   Nontender with normal range of motion in all extremities. No joint effusions.  No lower extremity tenderness.  No edema. No pain with hip flexion. Neurologic:   Normal speech and language.  CN 2-10 normal. Motor grossly intact. No gross focal neurologic deficits are appreciated.  Skin:    Skin is warm, dry and intact. No rash noted.  No petechiae, purpura, or bullae.  ____________________________________________    LABS (pertinent positives/negatives) (all labs ordered are listed, but only abnormal results are displayed) Labs Reviewed  BASIC METABOLIC PANEL - Abnormal; Notable for the following:       Result Value   Glucose, Bld 118 (*)    All other components within normal limits  URINALYSIS, COMPLETE (UACMP) WITH MICROSCOPIC - Abnormal; Notable for the following:    Color, Urine YELLOW (*)    APPearance CLEAR (*)    Ketones, ur 20 (*)    Protein, ur 30 (*)    Leukocytes, UA MODERATE (*)    Squamous Epithelial / LPF 0-5 (*)    All other components within normal limits  URINE CULTURE  CBC WITH DIFFERENTIAL/PLATELET   ____________________________________________   EKG    ____________________________________________    RADIOLOGY    ____________________________________________   PROCEDURES Procedures  ____________________________________________   INITIAL IMPRESSION / ASSESSMENT AND PLAN / ED COURSE  Pertinent labs & imaging results that were  available during my care of the patient were reviewed by me and considered in my medical decision making (see chart for details).  Patient presents with burning pain in the left pelvis, corresponding to area of lymphadenopathy related to her lymphoma likely. The symptoms are compatible with neuropathic pain.Considering the patient's symptoms, medical history, and physical examination today, I have low suspicion for cholecystitis or biliary pathology, pancreatitis, perforation or bowel obstruction, hernia, intra-abdominal abscess, AAA or dissection, volvulus or intussusception, mesenteric ischemia, or appendicitis.  Labs unremarkable. No indication for further imaging. CT and nuclear medicine PET scan recently performed were reviewed. Follow-up with Dr. Rogue Bussing scheduled appointment today.     Clinical Course    ____________________________________________   FINAL CLINICAL IMPRESSION(S) / ED DIAGNOSES  Final diagnoses:  LLQ pain      New Prescriptions   No medications on file     Portions of this note were generated with dragon dictation software. Dictation errors may occur despite best attempts at proofreading.  Carrie Mew, MD 08/16/16 1048

## 2016-08-17 ENCOUNTER — Encounter (INDEPENDENT_AMBULATORY_CARE_PROVIDER_SITE_OTHER): Payer: Self-pay

## 2016-08-17 ENCOUNTER — Ambulatory Visit: Payer: Medicare Other

## 2016-08-17 ENCOUNTER — Other Ambulatory Visit: Payer: Self-pay | Admitting: Radiology

## 2016-08-17 ENCOUNTER — Telehealth (INDEPENDENT_AMBULATORY_CARE_PROVIDER_SITE_OTHER): Payer: Self-pay

## 2016-08-17 ENCOUNTER — Ambulatory Visit
Admission: RE | Admit: 2016-08-17 | Discharge: 2016-08-17 | Disposition: A | Discharge status: Home / Self Care | Payer: Medicare Other | Source: Ambulatory Visit | Attending: Oncology | Admitting: Oncology

## 2016-08-17 ENCOUNTER — Telehealth: Payer: Self-pay | Admitting: *Deleted

## 2016-08-17 DIAGNOSIS — E119 Type 2 diabetes mellitus without complications: Secondary | ICD-10-CM | POA: Insufficient documentation

## 2016-08-17 DIAGNOSIS — C8590 Non-Hodgkin lymphoma, unspecified, unspecified site: Secondary | ICD-10-CM | POA: Diagnosis not present

## 2016-08-17 DIAGNOSIS — Z09 Encounter for follow-up examination after completed treatment for conditions other than malignant neoplasm: Secondary | ICD-10-CM | POA: Diagnosis present

## 2016-08-17 DIAGNOSIS — I340 Nonrheumatic mitral (valve) insufficiency: Secondary | ICD-10-CM | POA: Diagnosis not present

## 2016-08-17 DIAGNOSIS — I119 Hypertensive heart disease without heart failure: Secondary | ICD-10-CM | POA: Diagnosis not present

## 2016-08-17 DIAGNOSIS — Z0189 Encounter for other specified special examinations: Secondary | ICD-10-CM | POA: Diagnosis not present

## 2016-08-17 LAB — URINE CULTURE: Culture: 60000 — AB

## 2016-08-17 LAB — HEPATITIS B SURFACE ANTIGEN: HEP B S AG: NEGATIVE

## 2016-08-17 LAB — HEPATITIS B CORE ANTIBODY, TOTAL: HEP B C TOTAL AB: NEGATIVE

## 2016-08-17 LAB — HEPATITIS C ANTIBODY: HCV Ab: <0.1 s/co ratio (ref 0.0–0.9)

## 2016-08-17 NOTE — Telephone Encounter (Signed)
Called and left a message for the patient regarding her procedure on 08/22/16, I also left information on how to contact me if she has questions.

## 2016-08-17 NOTE — Telephone Encounter (Signed)
Called pt and left message with pt voicemail that she does have an appt for port placement 1/3 and [facility].  She will need to start chemo 1/5 at 8:45 for labs. I will call again 1/2 to try to speak to her.

## 2016-08-17 NOTE — Progress Notes (Signed)
*  PRELIMINARY RESULTS* Echocardiogram 2D Echocardiogram has been performed.  [place] 08/17/2016, 10:35 AM

## 2016-08-21 ENCOUNTER — Telehealth: Payer: Self-pay | Admitting: *Deleted

## 2016-08-21 ENCOUNTER — Ambulatory Visit
Admission: RE | Admit: 2016-08-21 | Discharge: 2016-08-21 | Disposition: A | Discharge status: Home / Self Care | Payer: Medicare Other | Source: Ambulatory Visit | Attending: Oncology | Admitting: Oncology

## 2016-08-21 DIAGNOSIS — C8333 Diffuse large B-cell lymphoma, intra-abdominal lymph nodes: Secondary | ICD-10-CM

## 2016-08-21 LAB — CBC
HEMATOCRIT: 37.6 % (ref 35.0–47.0)
Hemoglobin: 12.9 g/dL (ref 12.0–16.0)
MCH: 28.5 pg (ref 26.0–34.0)
MCHC: 34.2 g/dL (ref 32.0–36.0)
MCV: 83.2 fL (ref 80.0–100.0)
PLATELETS: 220 10*3/uL (ref 150–440)
RBC: 4.52 MIL/uL (ref 3.80–5.20)
RDW: 14 % (ref 11.5–14.5)
WBC: 6.4 10*3/uL (ref 3.6–11.0)

## 2016-08-21 LAB — DIFFERENTIAL
BASOS ABS: 0.1 10*3/uL (ref 0–0.1)
Basophils Relative: 1 %
Eosinophils Absolute: 0 10*3/uL (ref 0–0.7)
Eosinophils Relative: 1 %
LYMPHS PCT: 35 %
Lymphs Abs: 2.2 10*3/uL (ref 1.0–3.6)
MONO ABS: 0.8 10*3/uL (ref 0.2–0.9)
MONOS PCT: 12 %
NEUTROS ABS: 3.3 10*3/uL (ref 1.4–6.5)
Neutrophils Relative %: 51 %

## 2016-08-21 MED ORDER — SODIUM CHLORIDE 0.9 % IV SOLN
INTRAVENOUS | Status: DC
  Administered 2016-08-21: 12:00:00 via INTRAVENOUS

## 2016-08-21 MED ORDER — MIDAZOLAM HCL 2 MG/2ML IJ SOLN
INTRAMUSCULAR | Status: AC | PRN
  Administered 2016-08-21 (×2): 1 mg via INTRAVENOUS
  Administered 2016-08-21: 0.5 mg via INTRAVENOUS

## 2016-08-21 MED ORDER — HYDROCODONE-ACETAMINOPHEN 5-325 MG PO TABS
1.0000 | ORAL_TABLET | ORAL | Status: DC | PRN
  Filled 2016-08-21: qty 2

## 2016-08-21 MED ORDER — FENTANYL CITRATE (PF) 100 MCG/2ML IJ SOLN
INTRAMUSCULAR | Status: AC | PRN
  Administered 2016-08-21: 25 ug via INTRAVENOUS
  Administered 2016-08-21: 50 ug via INTRAVENOUS
  Administered 2016-08-21: 25 ug via INTRAVENOUS

## 2016-08-21 NOTE — Telephone Encounter (Signed)
Wanted to confirm that pt having port placement tom. She said eys she knows to go there 6:45.  Also she had questions about taking meds and that all appt are still on for the rest of the week and how often she would get chemo.  All questions answered.

## 2016-08-21 NOTE — Procedures (Signed)
Under CT guidance, bone marrow aspiration and biopsy was performed. No immediate complication.

## 2016-08-21 NOTE — CV Procedure (Signed)
Procedure and risks discussed with patient and husband. Informed consent obtained. Will perform CT-guided bone marrow biopsy.

## 2016-08-22 ENCOUNTER — Encounter
Admission: RE | Disposition: A | Discharge status: Home / Self Care | Payer: Self-pay | Source: Ambulatory Visit | Attending: Vascular Surgery

## 2016-08-22 ENCOUNTER — Ambulatory Visit
Admission: RE | Admit: 2016-08-22 | Discharge: 2016-08-22 | Disposition: A | Discharge status: Home / Self Care | Payer: Medicare Other | Source: Ambulatory Visit | Attending: Vascular Surgery | Admitting: Vascular Surgery

## 2016-08-22 DIAGNOSIS — Z87891 Personal history of nicotine dependence: Secondary | ICD-10-CM | POA: Diagnosis not present

## 2016-08-22 DIAGNOSIS — R591 Generalized enlarged lymph nodes: Secondary | ICD-10-CM | POA: Insufficient documentation

## 2016-08-22 DIAGNOSIS — E7800 Pure hypercholesterolemia, unspecified: Secondary | ICD-10-CM | POA: Insufficient documentation

## 2016-08-22 DIAGNOSIS — Z888 Allergy status to other drugs, medicaments and biological substances status: Secondary | ICD-10-CM | POA: Insufficient documentation

## 2016-08-22 DIAGNOSIS — C8590 Non-Hodgkin lymphoma, unspecified, unspecified site: Secondary | ICD-10-CM | POA: Insufficient documentation

## 2016-08-22 DIAGNOSIS — Z8249 Family history of ischemic heart disease and other diseases of the circulatory system: Secondary | ICD-10-CM | POA: Diagnosis not present

## 2016-08-22 DIAGNOSIS — E119 Type 2 diabetes mellitus without complications: Secondary | ICD-10-CM | POA: Insufficient documentation

## 2016-08-22 DIAGNOSIS — Z881 Allergy status to other antibiotic agents status: Secondary | ICD-10-CM | POA: Diagnosis not present

## 2016-08-22 DIAGNOSIS — Z90711 Acquired absence of uterus with remaining cervical stump: Secondary | ICD-10-CM | POA: Diagnosis not present

## 2016-08-22 DIAGNOSIS — Z7984 Long term (current) use of oral hypoglycemic drugs: Secondary | ICD-10-CM | POA: Insufficient documentation

## 2016-08-22 DIAGNOSIS — I10 Essential (primary) hypertension: Secondary | ICD-10-CM | POA: Insufficient documentation

## 2016-08-22 DIAGNOSIS — Z8711 Personal history of peptic ulcer disease: Secondary | ICD-10-CM | POA: Diagnosis not present

## 2016-08-22 HISTORY — PX: PERIPHERAL VASCULAR CATHETERIZATION: SHX172C

## 2016-08-22 LAB — SURGICAL PATHOLOGY

## 2016-08-22 LAB — GLUCOSE, CAPILLARY: GLUCOSE-CAPILLARY: 150 mg/dL — AB (ref 65–99)

## 2016-08-22 SURGERY — PORTA CATH INSERTION
Anesthesia: Moderate Sedation

## 2016-08-22 MED ORDER — FENTANYL CITRATE (PF) 100 MCG/2ML IJ SOLN
INTRAMUSCULAR | Status: DC | PRN
  Administered 2016-08-22: 25 ug via INTRAVENOUS
  Administered 2016-08-22: 50 ug via INTRAVENOUS
  Administered 2016-08-22: 25 ug via INTRAVENOUS

## 2016-08-22 MED ORDER — MIDAZOLAM HCL 2 MG/2ML IJ SOLN
INTRAMUSCULAR | Status: DC | PRN
  Administered 2016-08-22: 1 mg via INTRAVENOUS
  Administered 2016-08-22: 2 mg via INTRAVENOUS

## 2016-08-22 MED ORDER — HEPARIN SOD (PORK) LOCK FLUSH 100 UNIT/ML IV SOLN
INTRAVENOUS | Status: AC
  Filled 2016-08-22: qty 5

## 2016-08-22 MED ORDER — HEPARIN SOD (PORK) LOCK FLUSH 10 UNIT/ML IV SOLN
INTRAVENOUS | Status: AC
  Filled 2016-08-22: qty 1

## 2016-08-22 MED ORDER — METHYLPREDNISOLONE SODIUM SUCC 125 MG IJ SOLR: 125.0000 mg | Freq: Once | INTRAMUSCULAR | Status: DC

## 2016-08-22 MED ORDER — FENTANYL CITRATE (PF) 100 MCG/2ML IJ SOLN
INTRAMUSCULAR | Status: AC
  Filled 2016-08-22: qty 2

## 2016-08-22 MED ORDER — CEFAZOLIN IN D5W 1 GM/50ML IV SOLN
INTRAVENOUS | Status: AC
  Filled 2016-08-22: qty 50

## 2016-08-22 MED ORDER — MIDAZOLAM HCL 2 MG/2ML IJ SOLN
INTRAMUSCULAR | Status: AC
Start: 2016-08-22 — End: 2016-08-22
  Filled 2016-08-22: qty 2

## 2016-08-22 MED ORDER — CEFAZOLIN IN D5W 1 GM/50ML IV SOLN
1.0000 g | Freq: Once | INTRAVENOUS | Status: AC
  Administered 2016-08-22: 1 g via INTRAVENOUS

## 2016-08-22 MED ORDER — SODIUM CHLORIDE 0.9 % IV SOLN
INTRAVENOUS | Status: DC
  Administered 2016-08-22: 08:00:00 via INTRAVENOUS

## 2016-08-22 SURGICAL SUPPLY — 11 items
BAG DECANTER STRL (MISCELLANEOUS) ×3 IMPLANT
DERMABOND ADVANCED (GAUZE/BANDAGES/DRESSINGS) ×2
DERMABOND ADVANCED .7 DNX12 (GAUZE/BANDAGES/DRESSINGS) ×1 IMPLANT
DRAPE INCISE IOBAN 66X45 STRL (DRAPES) ×3 IMPLANT
KIT PORT POWER 8FR ISP CVUE (Catheter) ×3 IMPLANT
PACK ANGIOGRAPHY (CUSTOM PROCEDURE TRAY) ×3 IMPLANT
PREP CHG 10.5 TEAL (MISCELLANEOUS) ×3 IMPLANT
SUT MNCRL AB 4-0 PS2 18 (SUTURE) ×3 IMPLANT
SUT PROLENE 0 CT 1 30 (SUTURE) ×3 IMPLANT
SUTURE VIC 3-0 (SUTURE) ×3 IMPLANT
TOWEL OR 17X26 4PK STRL BLUE (TOWEL DISPOSABLE) ×3 IMPLANT

## 2016-08-22 NOTE — H&P (Signed)
[facility] History & Physical Update  The patient was interviewed and re-examined.  The patient's previous History and Physical has been reviewed and is unchanged.  There is no change in the plan of care. We plan to proceed with the scheduled procedure.  Hortencia Pilar, MD  08/22/2016, 8:20 AM

## 2016-08-22 NOTE — Patient Instructions (Signed)
Rituximab injection What is this medicine? RITUXIMAB (ri TUX i mab) is a monoclonal antibody. It is used to treat non-Hodgkin lymphoma and chronic lymphocytic leukemia. It is also used to treat rheumatoid arthritis (RA). In RA, this medicine slows the inflammatory process and help reduce joint pain and swelling. This medicine is often used with other cancer or arthritis medications. This medicine may be used for other purposes; ask your health care provider or pharmacist if you have questions. COMMON BRAND NAME(S): Rituxan What should I tell my health care provider before I take this medicine? They need to know if you have any of these conditions: -heart disease -infection (especially a virus infection such as hepatitis B, chickenpox, cold sores, or herpes) -immune system problems -irregular heartbeat -kidney disease -lung or breathing disease, like asthma -recently received or scheduled to receive a vaccine -an unusual or allergic reaction to rituximab, mouse proteins, other medicines, foods, dyes, or preservatives -pregnant or trying to get pregnant -breast-feeding How should I use this medicine? This medicine is for infusion into a vein. It is administered in a hospital or clinic by a specially trained health care professional. A special MedGuide will be given to you by the pharmacist with each prescription and refill. Be sure to read this information carefully each time. Talk to your pediatrician regarding the use of this medicine in children. This medicine is not approved for use in children. Overdosage: If you think you have taken too much of this medicine contact a poison control center or emergency room at once. NOTE: This medicine is only for you. Do not share this medicine with others. What if I miss a dose? It is important not to miss a dose. Call your doctor or health care professional if you are unable to keep an appointment. What may interact with this  medicine? -cisplatin -medicines for blood pressure -some other medicines for arthritis -vaccines This list may not describe all possible interactions. Give your health care provider a list of all the medicines, herbs, non-prescription drugs, or dietary supplements you use. Also tell them if you smoke, drink alcohol, or use illegal drugs. Some items may interact with your medicine. What should I watch for while using this medicine? Report any side effects that you notice during your treatment right away, such as changes in your breathing, fever, chills, dizziness or lightheadedness. These effects are more common with the first dose. Visit your prescriber or health care professional for checks on your progress. You will need to have regular blood work. Report any other side effects. The side effects of this medicine can continue after you finish your treatment. Continue your course of treatment even though you feel ill unless your doctor tells you to stop. Call your doctor or health care professional for advice if you get a fever, chills or sore throat, or other symptoms of a cold or flu. Do not treat yourself. This drug decreases your body's ability to fight infections. Try to avoid being around people who are sick. This medicine may increase your risk to bruise or bleed. Call your doctor or health care professional if you notice any unusual bleeding. Be careful brushing and flossing your teeth or using a toothpick because you may get an infection or bleed more easily. If you have any dental work done, tell your dentist you are receiving this medicine. Avoid taking products that contain aspirin, acetaminophen, ibuprofen, naproxen, or ketoprofen unless instructed by your doctor. These medicines may hide a fever. Do not become pregnant  while taking this medicine. Women should inform their doctor if they wish to become pregnant or think they might be pregnant. There is a potential for serious side effects  to an unborn child. Talk to your health care professional or pharmacist for more information. Do not breast-feed an infant while taking this medicine. What side effects may I notice from receiving this medicine? Side effects that you should report to your doctor or health care professional as soon as possible: -allergic reactions like skin rash, itching or hives, swelling of the face, lips, or tongue -low blood counts - this medicine may decrease the number of white blood cells, red blood cells and platelets. You may be at increased risk for infections and bleeding. -signs of infection - fever or chills, cough, sore throat, pain or difficulty passing urine -signs of decreased platelets or bleeding - bruising, pinpoint red spots on the skin, black, tarry stools, blood in the urine -signs of decreased red blood cells - unusually weak or tired, fainting spells, lightheadedness -breathing problems -confused, not responsive -chest pain -fast, irregular heartbeat -feeling faint or lightheaded, falls -mouth sores -redness, blistering, peeling or loosening of the skin, including inside the mouth -stomach pain -swelling of the ankles, feet, or hands -trouble passing urine or change in the amount of urine Side effects that usually do not require medical attention (report to your doctor or health care professional if they continue or are bothersome): -anxiety -headache -loss of appetite -muscle aches -nausea -night sweats This list may not describe all possible side effects. Call your doctor for medical advice about side effects. You may report side effects to FDA at 1-800-FDA-1088. Where should I keep my medicine? This drug is given in a hospital or clinic and will not be stored at home. NOTE: This sheet is a summary. It may not cover all possible information. If you have questions about this medicine, talk to your doctor, pharmacist, or health care provider.  2017 [employer] Standard  (2016-02-16 17:23:26) Doxorubicin injection What is this medicine? DOXORUBICIN (dox oh ROO bi sin) is a chemotherapy drug. It is used to treat many kinds of cancer like leukemia, lymphoma, neuroblastoma, sarcoma, and Wilms' tumor. It is also used to treat bladder cancer, breast cancer, lung cancer, ovarian cancer, stomach cancer, and thyroid cancer. This medicine may be used for other purposes; ask your health care provider or pharmacist if you have questions. COMMON BRAND NAME(S): Adriamycin, Adriamycin PFS, Adriamycin RDF, Rubex What should I tell my health care provider before I take this medicine? They need to know if you have any of these conditions: -heart disease -history of low blood counts caused by a medicine -liver disease -recent or ongoing radiation therapy -an unusual or allergic reaction to doxorubicin, other chemotherapy agents, other medicines, foods, dyes, or preservatives -pregnant or trying to get pregnant -breast-feeding How should I use this medicine? This drug is given as an infusion into a vein. It is administered in a hospital or clinic by a specially trained health care professional. If you have pain, swelling, burning or any unusual feeling around the site of your injection, tell your health care professional right away. Talk to your pediatrician regarding the use of this medicine in children. Special care may be needed. Overdosage: If you think you have taken too much of this medicine contact a poison control center or emergency room at once. NOTE: This medicine is only for you. Do not share this medicine with others. What if I miss a dose? It  is important not to miss your dose. Call your doctor or health care professional if you are unable to keep an appointment. What may interact with this medicine? This medicine may interact with the following medications: -6-mercaptopurine -paclitaxel -phenytoin -[name]'s Wort -trastuzumab -verapamil This list may not  describe all possible interactions. Give your health care provider a list of all the medicines, herbs, non-prescription drugs, or dietary supplements you use. Also tell them if you smoke, drink alcohol, or use illegal drugs. Some items may interact with your medicine. What should I watch for while using this medicine? This drug may make you feel generally unwell. This is not uncommon, as chemotherapy can affect healthy cells as well as cancer cells. Report any side effects. Continue your course of treatment even though you feel ill unless your doctor tells you to stop. There is a maximum amount of this medicine you should receive throughout your life. The amount depends on the medical condition being treated and your overall health. Your doctor will watch how much of this medicine you receive in your lifetime. Tell your doctor if you have taken this medicine before. You may need blood work done while you are taking this medicine. Your urine may turn red for a few days after your dose. This is not blood. If your urine is dark or brown, call your doctor. In some cases, you may be given additional medicines to help with side effects. Follow all directions for their use. Call your doctor or health care professional for advice if you get a fever, chills or sore throat, or other symptoms of a cold or flu. Do not treat yourself. This drug decreases your body's ability to fight infections. Try to avoid being around people who are sick. This medicine may increase your risk to bruise or bleed. Call your doctor or health care professional if you notice any unusual bleeding. Talk to your doctor about your risk of cancer. You may be more at risk for certain types of cancers if you take this medicine. Do not become pregnant while taking this medicine or for 6 months after stopping it. Women should inform their doctor if they wish to become pregnant or think they might be pregnant. Men should not father a child while  taking this medicine and for 6 months after stopping it. There is a potential for serious side effects to an unborn child. Talk to your health care professional or pharmacist for more information. Do not breast-feed an infant while taking this medicine. This medicine has caused ovarian failure in some women and reduced sperm counts in some men This medicine may interfere with the ability to have a child. Talk with your doctor or health care professional if you are concerned about your fertility. What side effects may I notice from receiving this medicine? Side effects that you should report to your doctor or health care professional as soon as possible: -allergic reactions like skin rash, itching or hives, swelling of the face, lips, or tongue -breathing problems -chest pain -fast or irregular heartbeat -low blood counts - this medicine may decrease the number of white blood cells, red blood cells and platelets. You may be at increased risk for infections and bleeding. -pain, redness, or irritation at site where injected -signs of infection - fever or chills, cough, sore throat, pain or difficulty passing urine -signs of decreased platelets or bleeding - bruising, pinpoint red spots on the skin, black, tarry stools, blood in the urine -swelling of the ankles,  feet, hands -tiredness -weakness Side effects that usually do not require medical attention (report to your doctor or health care professional if they continue or are bothersome): -diarrhea -hair loss -mouth sores -nail discoloration or damage -nausea -red colored urine -vomiting This list may not describe all possible side effects. Call your doctor for medical advice about side effects. You may report side effects to FDA at 1-800-FDA-1088. Where should I keep my medicine? This drug is given in a hospital or clinic and will not be stored at home. NOTE: This sheet is a summary. It may not cover all possible information. If you have  questions about this medicine, talk to your doctor, pharmacist, or health care provider.  2017 [employer]/Gold Standard (2015-10-03 11:28:51) Vincristine injection What is this medicine? VINCRISTINE (vin KRIS teen) is a chemotherapy drug. It slows the growth of cancer cells. This medicine is used to treat many types of cancer like Hodgkin's disease, leukemia, non-Hodgkin's lymphoma, neuroblastoma (brain cancer), rhabdomyosarcoma, and Wilms' tumor. This medicine may be used for other purposes; ask your health care provider or pharmacist if you have questions. COMMON BRAND NAME(S): Oncovin, Vincasar PFS What should I tell my health care provider before I take this medicine? They need to know if you have any of these conditions: -blood disorders -gout -infection (especially chickenpox, cold sores, or herpes) -kidney disease -liver disease -lung disease -nervous system disease like Charcot-Marie-Tooth (CMT) -recent or ongoing radiation therapy -an unusual or allergic reaction to vincristine, other chemotherapy agents, other medicines, foods, dyes, or preservatives -pregnant or trying to get pregnant -breast-feeding How should I use this medicine? This drug is given as an infusion into a vein. It is administered in a hospital or clinic by a specially trained health care professional. If you have pain, swelling, burning, or any unusual feeling around the site of your injection, tell your health care professional right away. Talk to your pediatrician regarding the use of this medicine in children. While this drug may be prescribed for selected conditions, precautions do apply. Overdosage: If you think you have taken too much of this medicine contact a poison control center or emergency room at once. NOTE: This medicine is only for you. Do not share this medicine with others. What if I miss a dose? It is important not to miss your dose. Call your doctor or health care professional if you are unable  to keep an appointment. What may interact with this medicine? Do not take this medicine with any of the following medications: -itraconazole -mibefradil -voriconazole This medicine may also interact with the following medications: -cyclosporine -erythromycin -fluconazole -ketoconazole -medicines for HIV like delavirdine, efavirenz, nevirapine -medicines for seizures like ethotoin, fosphenotoin, phenytoin -medicines to increase blood counts like filgrastim, pegfilgrastim, sargramostim -other chemotherapy drugs like cisplatin, L-asparaginase, methotrexate, mitomycin, paclitaxel -pegaspargase -vaccines -zalcitabine, ddC Talk to your doctor or health care professional before taking any of these medicines: -acetaminophen -aspirin -ibuprofen -ketoprofen -naproxen This list may not describe all possible interactions. Give your health care provider a list of all the medicines, herbs, non-prescription drugs, or dietary supplements you use. Also tell them if you smoke, drink alcohol, or use illegal drugs. Some items may interact with your medicine. What should I watch for while using this medicine? Your condition will be monitored carefully while you are receiving this medicine. You will need important blood work done while you are taking this medicine. This drug may make you feel generally unwell. This is not uncommon, as chemotherapy can affect healthy cells as  well as cancer cells. Report any side effects. Continue your course of treatment even though you feel ill unless your doctor tells you to stop. In some cases, you may be given additional medicines to help with side effects. Follow all directions for their use. Call your doctor or health care professional for advice if you get a fever, chills or sore throat, or other symptoms of a cold or flu. Do not treat yourself. Avoid taking products that contain aspirin, acetaminophen, ibuprofen, naproxen, or ketoprofen unless instructed by your  doctor. These medicines may hide a fever. Do not become pregnant while taking this medicine. Women should inform their doctor if they wish to become pregnant or think they might be pregnant. There is a potential for serious side effects to an unborn child. Talk to your health care professional or pharmacist for more information. Do not breast-feed an infant while taking this medicine. Men may have a lower sperm count while taking this medicine. Talk to your doctor if you plan to father a child. What side effects may I notice from receiving this medicine? Side effects that you should report to your doctor or health care professional as soon as possible: -allergic reactions like skin rash, itching or hives, swelling of the face, lips, or tongue -breathing problems -confusion or changes in emotions or moods -constipation -cough -mouth sores -muscle weakness -nausea and vomiting -pain, swelling, redness or irritation at the injection site -pain, tingling, numbness in the hands or feet -problems with balance, talking, walking -seizures -stomach pain -trouble passing urine or change in the amount of urine Side effects that usually do not require medical attention (report to your doctor or health care professional if they continue or are bothersome): -diarrhea -hair loss -jaw pain -loss of appetite This list may not describe all possible side effects. Call your doctor for medical advice about side effects. You may report side effects to FDA at 1-800-FDA-1088. Where should I keep my medicine? This drug is given in a hospital or clinic and will not be stored at home. NOTE: This sheet is a summary. It may not cover all possible information. If you have questions about this medicine, talk to your doctor, pharmacist, or health care provider.  2017 [employer] Standard (2008-05-03 17:17:13) Cyclophosphamide injection What is this medicine? CYCLOPHOSPHAMIDE (sye kloe FOSS fa mide) is a  chemotherapy drug. It slows the growth of cancer cells. This medicine is used to treat many types of cancer like lymphoma, myeloma, leukemia, breast cancer, and ovarian cancer, to name a few. This medicine may be used for other purposes; ask your health care provider or pharmacist if you have questions. COMMON BRAND NAME(S): Cytoxan, Neosar What should I tell my health care provider before I take this medicine? They need to know if you have any of these conditions: -blood disorders -history of other chemotherapy -infection -kidney disease -liver disease -recent or ongoing radiation therapy -tumors in the bone marrow -an unusual or allergic reaction to cyclophosphamide, other chemotherapy, other medicines, foods, dyes, or preservatives -pregnant or trying to get pregnant -breast-feeding How should I use this medicine? This drug is usually given as an injection into a vein or muscle or by infusion into a vein. It is administered in a hospital or clinic by a specially trained health care professional. Talk to your pediatrician regarding the use of this medicine in children. Special care may be needed. Overdosage: If you think you have taken too much of this medicine contact a poison control center or  emergency room at once. NOTE: This medicine is only for you. Do not share this medicine with others. What if I miss a dose? It is important not to miss your dose. Call your doctor or health care professional if you are unable to keep an appointment. What may interact with this medicine? This medicine may interact with the following medications: -amiodarone -amphotericin B -azathioprine -certain antiviral medicines for HIV or AIDS such as protease inhibitors (e.g., indinavir, ritonavir) and zidovudine -certain blood pressure medications such as benazepril, captopril, enalapril, fosinopril, lisinopril, moexipril, monopril, perindopril, quinapril, ramipril, trandolapril -certain cancer medications  such as anthracyclines (e.g., daunorubicin, doxorubicin), busulfan, cytarabine, paclitaxel, pentostatin, tamoxifen, trastuzumab -certain diuretics such as chlorothiazide, chlorthalidone, hydrochlorothiazide, indapamide, metolazone -certain medicines that treat or prevent blood clots like warfarin -certain muscle relaxants such as succinylcholine -cyclosporine -etanercept -indomethacin -medicines to increase blood counts like filgrastim, pegfilgrastim, sargramostim -medicines used as general anesthesia -metronidazole -natalizumab This list may not describe all possible interactions. Give your health care provider a list of all the medicines, herbs, non-prescription drugs, or dietary supplements you use. Also tell them if you smoke, drink alcohol, or use illegal drugs. Some items may interact with your medicine. What should I watch for while using this medicine? Visit your doctor for checks on your progress. This drug may make you feel generally unwell. This is not uncommon, as chemotherapy can affect healthy cells as well as cancer cells. Report any side effects. Continue your course of treatment even though you feel ill unless your doctor tells you to stop. Drink water or other fluids as directed. Urinate often, even at night. In some cases, you may be given additional medicines to help with side effects. Follow all directions for their use. Call your doctor or health care professional for advice if you get a fever, chills or sore throat, or other symptoms of a cold or flu. Do not treat yourself. This drug decreases your body's ability to fight infections. Try to avoid being around people who are sick. This medicine may increase your risk to bruise or bleed. Call your doctor or health care professional if you notice any unusual bleeding. Be careful brushing and flossing your teeth or using a toothpick because you may get an infection or bleed more easily. If you have any dental work done, tell your  dentist you are receiving this medicine. You may get drowsy or dizzy. Do not drive, use machinery, or do anything that needs mental alertness until you know how this medicine affects you. Do not become pregnant while taking this medicine or for 1 year after stopping it. Women should inform their doctor if they wish to become pregnant or think they might be pregnant. Men should not father a child while taking this medicine and for 4 months after stopping it. There is a potential for serious side effects to an unborn child. Talk to your health care professional or pharmacist for more information. Do not breast-feed an infant while taking this medicine. This medicine may interfere with the ability to have a child. This medicine has caused ovarian failure in some women. This medicine has caused reduced sperm counts in some men. You should talk with your doctor or health care professional if you are concerned about your fertility. If you are going to have surgery, tell your doctor or health care professional that you have taken this medicine. What side effects may I notice from receiving this medicine? Side effects that you should report to your doctor or health  care professional as soon as possible: -allergic reactions like skin rash, itching or hives, swelling of the face, lips, or tongue -low blood counts - this medicine may decrease the number of white blood cells, red blood cells and platelets. You may be at increased risk for infections and bleeding. -signs of infection - fever or chills, cough, sore throat, pain or difficulty passing urine -signs of decreased platelets or bleeding - bruising, pinpoint red spots on the skin, black, tarry stools, blood in the urine -signs of decreased red blood cells - unusually weak or tired, fainting spells, lightheadedness -breathing problems -dark urine -dizziness -palpitations -swelling of the ankles, feet, hands -trouble passing urine or change in the amount  of urine -weight gain -yellowing of the eyes or skin Side effects that usually do not require medical attention (report to your doctor or health care professional if they continue or are bothersome): -changes in nail or skin color -hair loss -missed menstrual periods -mouth sores -nausea, vomiting This list may not describe all possible side effects. Call your doctor for medical advice about side effects. You may report side effects to FDA at 1-800-FDA-1088. Where should I keep my medicine? This drug is given in a hospital or clinic and will not be stored at home. NOTE: This sheet is a summary. It may not cover all possible information. If you have questions about this medicine, talk to your doctor, pharmacist, or health care provider.  2017 [employer] Standard (2012-06-20 16:22:58)

## 2016-08-22 NOTE — Op Note (Signed)
OPERATIVE NOTE   PROCEDURE: 1. Placement of a right IJ Infuse-a-Port  PRE-OPERATIVE DIAGNOSIS: Lymphoma  POST-OPERATIVE DIAGNOSIS: Same  SURGEON: [name] M.D.  ANESTHESIA: Conscious sedation combined with 1% lidocaine with epinephrine  ESTIMATED BLOOD LOSS: Minimal   FINDING(S): 1.  Patent vein  SPECIMEN(S): None  INDICATIONS:   Rose Hill is a 71 y.o. female who presents with multiple enlarged lymph nodes. She'll be undergoing chemotherapy to treat her malignancy and therefore needs adequate IV access. Risks and benefits of been reviewed all questions answered patient has agreed to proceed with Infuse-a-Port placement  DESCRIPTION: After obtaining full informed written consent, the patient was brought back to the special procedure suite and placed in the supine position. The patient's right neck and chest wall are prepped and draped in sterile fashion. Appropriate timeout was called.  Ultrasound is placed in a sterile sleeve, ultrasound is utilized to avoid vascular injury as well as secondary to lack of appropriate landmarks. The right internal jugular vein is identified. It is echolucent and homogeneous as well as easily compressible indicating patency. 1% lidocaine is infiltrated into the soft tissue at the base of the neck as well as on the chest wall.  Under direct ultrasound visualization Seldinger needle is inserted into the right internal jugular vein. J-wire is advanced under fluoroscopic guidance. A small counterincision was created at the wire insertion site. A transverse incision is created 2 fingerbreadths below the scapula and a pocket is fashioned using both blunt and sharp dissection. The pocket is tested for appropriate size with the hub of the Infuse-a-Port. The tunneling device is then used to pull the intravascular portion of the catheter from the pocket to the neck counterincision.  Dilator and peel-away sheath were then inserted over the wire and the  wire is removed. Catheter is then advanced into the venous system without difficulty. Peel-away sheath was then removed.  Catheter is then positioned under fluoroscopic guidance at the atrial caval junction. It is then transected connected to the hub and the hope is slipped into the subcutaneous pocket on the chest wall. The hub was then accessed percutaneously and aspirates easily and flushes well and is flushed with 30 cc of heparinized saline. The pocket incision is then closed in layers using interrupted 3-0 Vicryl for the subcutaneous tissues and 4-0 Monocryl subcuticular for skin closure. Dermabond is applied. The neck counterincision was closed with 4-0 Monocryl subcuticular and Dermabond as well.  The patient tolerated the procedure well and there were no immediate complications.  COMPLICATIONS: None  CONDITION: Unchanged  [name] M.D. [facility] Office: [phone]   08/22/2016, 9:43 AM

## 2016-08-23 ENCOUNTER — Ambulatory Visit: Payer: Medicare Other

## 2016-08-23 ENCOUNTER — Encounter: Payer: Self-pay | Admitting: Vascular Surgery

## 2016-08-23 ENCOUNTER — Inpatient Hospital Stay: Payer: Medicare Other | Attending: Oncology

## 2016-08-23 ENCOUNTER — Other Ambulatory Visit: Payer: Self-pay | Admitting: *Deleted

## 2016-08-23 DIAGNOSIS — G893 Neoplasm related pain (acute) (chronic): Secondary | ICD-10-CM | POA: Insufficient documentation

## 2016-08-23 DIAGNOSIS — E876 Hypokalemia: Secondary | ICD-10-CM | POA: Insufficient documentation

## 2016-08-23 DIAGNOSIS — Z87891 Personal history of nicotine dependence: Secondary | ICD-10-CM | POA: Insufficient documentation

## 2016-08-23 DIAGNOSIS — R197 Diarrhea, unspecified: Secondary | ICD-10-CM | POA: Insufficient documentation

## 2016-08-23 DIAGNOSIS — R51 Headache: Secondary | ICD-10-CM | POA: Insufficient documentation

## 2016-08-23 DIAGNOSIS — C8333 Diffuse large B-cell lymphoma, intra-abdominal lymph nodes: Secondary | ICD-10-CM

## 2016-08-23 DIAGNOSIS — E871 Hypo-osmolality and hyponatremia: Secondary | ICD-10-CM | POA: Insufficient documentation

## 2016-08-23 DIAGNOSIS — R5383 Other fatigue: Secondary | ICD-10-CM | POA: Insufficient documentation

## 2016-08-23 DIAGNOSIS — J849 Interstitial pulmonary disease, unspecified: Secondary | ICD-10-CM | POA: Insufficient documentation

## 2016-08-23 DIAGNOSIS — Z79899 Other long term (current) drug therapy: Secondary | ICD-10-CM | POA: Insufficient documentation

## 2016-08-23 DIAGNOSIS — T402X5S Adverse effect of other opioids, sequela: Secondary | ICD-10-CM | POA: Insufficient documentation

## 2016-08-23 DIAGNOSIS — E119 Type 2 diabetes mellitus without complications: Secondary | ICD-10-CM | POA: Insufficient documentation

## 2016-08-23 DIAGNOSIS — Z8711 Personal history of peptic ulcer disease: Secondary | ICD-10-CM | POA: Insufficient documentation

## 2016-08-23 DIAGNOSIS — K5903 Drug induced constipation: Secondary | ICD-10-CM | POA: Insufficient documentation

## 2016-08-23 DIAGNOSIS — Z5112 Encounter for antineoplastic immunotherapy: Secondary | ICD-10-CM | POA: Insufficient documentation

## 2016-08-23 DIAGNOSIS — Z5111 Encounter for antineoplastic chemotherapy: Secondary | ICD-10-CM | POA: Insufficient documentation

## 2016-08-23 DIAGNOSIS — E7800 Pure hypercholesterolemia, unspecified: Secondary | ICD-10-CM | POA: Insufficient documentation

## 2016-08-23 DIAGNOSIS — I10 Essential (primary) hypertension: Secondary | ICD-10-CM | POA: Insufficient documentation

## 2016-08-23 DIAGNOSIS — I2510 Atherosclerotic heart disease of native coronary artery without angina pectoris: Secondary | ICD-10-CM | POA: Insufficient documentation

## 2016-08-23 DIAGNOSIS — Z7984 Long term (current) use of oral hypoglycemic drugs: Secondary | ICD-10-CM | POA: Insufficient documentation

## 2016-08-23 DIAGNOSIS — K449 Diaphragmatic hernia without obstruction or gangrene: Secondary | ICD-10-CM | POA: Insufficient documentation

## 2016-08-24 ENCOUNTER — Other Ambulatory Visit: Payer: Self-pay | Admitting: *Deleted

## 2016-08-24 ENCOUNTER — Inpatient Hospital Stay: Payer: Medicare Other

## 2016-08-24 ENCOUNTER — Inpatient Hospital Stay (HOSPITAL_BASED_OUTPATIENT_CLINIC_OR_DEPARTMENT_OTHER): Payer: Medicare Other | Admitting: Oncology

## 2016-08-24 VITALS — BP 137/80 | HR 108 | Temp 98.5°F | Wt 153.9 lb

## 2016-08-24 VITALS — BP 104/60 | HR 91 | Temp 98.7°F | Resp 18

## 2016-08-24 DIAGNOSIS — K449 Diaphragmatic hernia without obstruction or gangrene: Secondary | ICD-10-CM | POA: Diagnosis not present

## 2016-08-24 DIAGNOSIS — Z87891 Personal history of nicotine dependence: Secondary | ICD-10-CM | POA: Diagnosis not present

## 2016-08-24 DIAGNOSIS — E7800 Pure hypercholesterolemia, unspecified: Secondary | ICD-10-CM

## 2016-08-24 DIAGNOSIS — Z7984 Long term (current) use of oral hypoglycemic drugs: Secondary | ICD-10-CM

## 2016-08-24 DIAGNOSIS — R110 Nausea: Secondary | ICD-10-CM

## 2016-08-24 DIAGNOSIS — I2510 Atherosclerotic heart disease of native coronary artery without angina pectoris: Secondary | ICD-10-CM | POA: Diagnosis not present

## 2016-08-24 DIAGNOSIS — T402X5S Adverse effect of other opioids, sequela: Secondary | ICD-10-CM | POA: Diagnosis not present

## 2016-08-24 DIAGNOSIS — J849 Interstitial pulmonary disease, unspecified: Secondary | ICD-10-CM | POA: Diagnosis not present

## 2016-08-24 DIAGNOSIS — I10 Essential (primary) hypertension: Secondary | ICD-10-CM

## 2016-08-24 DIAGNOSIS — E119 Type 2 diabetes mellitus without complications: Secondary | ICD-10-CM | POA: Diagnosis not present

## 2016-08-24 DIAGNOSIS — C8333 Diffuse large B-cell lymphoma, intra-abdominal lymph nodes: Secondary | ICD-10-CM

## 2016-08-24 DIAGNOSIS — K5903 Drug induced constipation: Secondary | ICD-10-CM

## 2016-08-24 DIAGNOSIS — Z5111 Encounter for antineoplastic chemotherapy: Secondary | ICD-10-CM | POA: Diagnosis not present

## 2016-08-24 DIAGNOSIS — R197 Diarrhea, unspecified: Secondary | ICD-10-CM | POA: Diagnosis not present

## 2016-08-24 DIAGNOSIS — E876 Hypokalemia: Secondary | ICD-10-CM | POA: Diagnosis not present

## 2016-08-24 DIAGNOSIS — Z8711 Personal history of peptic ulcer disease: Secondary | ICD-10-CM

## 2016-08-24 DIAGNOSIS — Z79899 Other long term (current) drug therapy: Secondary | ICD-10-CM

## 2016-08-24 DIAGNOSIS — R51 Headache: Secondary | ICD-10-CM | POA: Diagnosis not present

## 2016-08-24 DIAGNOSIS — R5383 Other fatigue: Secondary | ICD-10-CM | POA: Diagnosis not present

## 2016-08-24 DIAGNOSIS — E871 Hypo-osmolality and hyponatremia: Secondary | ICD-10-CM | POA: Diagnosis not present

## 2016-08-24 DIAGNOSIS — G893 Neoplasm related pain (acute) (chronic): Secondary | ICD-10-CM

## 2016-08-24 DIAGNOSIS — T451X5A Adverse effect of antineoplastic and immunosuppressive drugs, initial encounter: Principal | ICD-10-CM

## 2016-08-24 DIAGNOSIS — Z5112 Encounter for antineoplastic immunotherapy: Secondary | ICD-10-CM | POA: Diagnosis not present

## 2016-08-24 LAB — CBC WITH DIFFERENTIAL/PLATELET
BASOS ABS: 0.1 10*3/uL (ref 0–0.1)
BASOS PCT: 1 %
EOS ABS: 0.2 10*3/uL (ref 0–0.7)
EOS PCT: 2 %
HCT: 38.7 % (ref 35.0–47.0)
HEMOGLOBIN: 13 g/dL (ref 12.0–16.0)
Lymphocytes Relative: 20 %
Lymphs Abs: 1.4 10*3/uL (ref 1.0–3.6)
MCH: 28 pg (ref 26.0–34.0)
MCHC: 33.6 g/dL (ref 32.0–36.0)
MCV: 83.2 fL (ref 80.0–100.0)
Monocytes Absolute: 0.6 10*3/uL (ref 0.2–0.9)
Monocytes Relative: 9 %
NEUTROS PCT: 68 %
Neutro Abs: 4.6 10*3/uL (ref 1.4–6.5)
PLATELETS: 227 10*3/uL (ref 150–440)
RBC: 4.65 MIL/uL (ref 3.80–5.20)
RDW: 14.7 % — ABNORMAL HIGH (ref 11.5–14.5)
WBC: 6.8 10*3/uL (ref 3.6–11.0)

## 2016-08-24 LAB — COMPREHENSIVE METABOLIC PANEL
ALBUMIN: 4.2 g/dL (ref 3.5–5.0)
ALT: 14 U/L (ref 14–54)
AST: 21 U/L (ref 15–41)
Alkaline Phosphatase: 43 U/L (ref 38–126)
Anion gap: 10 (ref 5–15)
BUN: 9 mg/dL (ref 6–20)
CHLORIDE: 103 mmol/L (ref 101–111)
CO2: 25 mmol/L (ref 22–32)
CREATININE: 0.66 mg/dL (ref 0.44–1.00)
Calcium: 9 mg/dL (ref 8.9–10.3)
GFR calc non Af Amer: >60 mL/min (ref >60)
GLUCOSE: 151 mg/dL — AB (ref 65–99)
Potassium: 3.2 mmol/L — ABNORMAL LOW (ref 3.5–5.1)
SODIUM: 138 mmol/L (ref 135–145)
Total Bilirubin: 0.7 mg/dL (ref 0.3–1.2)
Total Protein: 7.2 g/dL (ref 6.5–8.1)

## 2016-08-24 MED ORDER — VINCRISTINE SULFATE CHEMO INJECTION 1 MG/ML
2.0000 mg | Freq: Once | INTRAVENOUS | Status: AC
  Administered 2016-08-24: 2 mg via INTRAVENOUS
  Filled 2016-08-24 (×2): qty 2

## 2016-08-24 MED ORDER — OXYCODONE HCL 5 MG PO TABS: 2.5000 mg | ORAL_TABLET | Freq: Four times a day (QID) | ORAL | 0 refills | Status: DC | PRN

## 2016-08-24 MED ORDER — ONDANSETRON HCL 8 MG PO TABS: 8.0000 mg | ORAL_TABLET | Freq: Three times a day (TID) | ORAL | 0 refills | Status: DC | PRN

## 2016-08-24 MED ORDER — SODIUM CHLORIDE 0.9 % IV SOLN
1400.0000 mg | Freq: Once | INTRAVENOUS | Status: AC
  Administered 2016-08-24: 1400 mg via INTRAVENOUS
  Filled 2016-08-24: qty 50

## 2016-08-24 MED ORDER — DEXAMETHASONE SODIUM PHOSPHATE 10 MG/ML IJ SOLN
10.0000 mg | Freq: Once | INTRAMUSCULAR | Status: AC
  Administered 2016-08-24: 10 mg via INTRAVENOUS
  Filled 2016-08-24: qty 1

## 2016-08-24 MED ORDER — SODIUM CHLORIDE 0.9 % IV SOLN
375.0000 mg/m2 | Freq: Once | INTRAVENOUS | Status: AC
  Administered 2016-08-24: 700 mg via INTRAVENOUS
  Filled 2016-08-24: qty 50

## 2016-08-24 MED ORDER — ACETAMINOPHEN 325 MG PO TABS
650.0000 mg | ORAL_TABLET | Freq: Once | ORAL | Status: AC
  Administered 2016-08-24: 650 mg via ORAL
  Filled 2016-08-24: qty 2

## 2016-08-24 MED ORDER — ONDANSETRON HCL 8 MG PO TABS
8.0000 mg | ORAL_TABLET | Freq: Three times a day (TID) | ORAL | 0 refills | Status: DC | PRN
Start: 2016-08-24 — End: 2016-08-24

## 2016-08-24 MED ORDER — PALONOSETRON HCL INJECTION 0.25 MG/5ML
0.2500 mg | Freq: Once | INTRAVENOUS | Status: AC
  Administered 2016-08-24: 0.25 mg via INTRAVENOUS
  Filled 2016-08-24: qty 5

## 2016-08-24 MED ORDER — DIPHENHYDRAMINE HCL 25 MG PO CAPS
50.0000 mg | ORAL_CAPSULE | Freq: Once | ORAL | Status: AC
  Administered 2016-08-24: 50 mg via ORAL
  Filled 2016-08-24: qty 2

## 2016-08-24 MED ORDER — HYDROCORTISONE NA SUCCINATE PF 100 MG IJ SOLR
25.0000 mg | Freq: Once | INTRAMUSCULAR | Status: AC
  Administered 2016-08-24: 25 mg via INTRAVENOUS

## 2016-08-24 MED ORDER — DIPHENHYDRAMINE HCL 50 MG/ML IJ SOLN
25.0000 mg | Freq: Once | INTRAMUSCULAR | Status: AC
  Administered 2016-08-24: 25 mg via INTRAVENOUS
  Filled 2016-08-24: qty 1

## 2016-08-24 MED ORDER — SODIUM CHLORIDE 0.9% FLUSH
10.0000 mL | INTRAVENOUS | Status: DC | PRN
  Administered 2016-08-24: 10 mL via INTRAVENOUS
  Filled 2016-08-24: qty 10

## 2016-08-24 MED ORDER — DOXORUBICIN HCL CHEMO IV INJECTION 2 MG/ML
50.0000 mg/m2 | Freq: Once | INTRAVENOUS | Status: AC
  Administered 2016-08-24: 92 mg via INTRAVENOUS
  Filled 2016-08-24: qty 46

## 2016-08-24 MED ORDER — PROCHLORPERAZINE MALEATE 10 MG PO TABS: 10.0000 mg | ORAL_TABLET | Freq: Four times a day (QID) | ORAL | 0 refills | Status: DC | PRN

## 2016-08-24 MED ORDER — SODIUM CHLORIDE 0.9 % IV SOLN: 10.0000 mg | Freq: Once | INTRAVENOUS | Status: DC

## 2016-08-24 MED ORDER — SODIUM CHLORIDE 0.9 % IV SOLN
Freq: Once | INTRAVENOUS | Status: AC
  Administered 2016-08-24: 10:00:00 via INTRAVENOUS
  Filled 2016-08-24: qty 1000

## 2016-08-24 MED ORDER — HEPARIN SOD (PORK) LOCK FLUSH 100 UNIT/ML IV SOLN
500.0000 [IU] | Freq: Once | INTRAVENOUS | Status: AC
  Administered 2016-08-24: 500 [IU] via INTRAVENOUS
  Filled 2016-08-24: qty 5

## 2016-08-24 MED ORDER — HYDROCORTISONE NA SUCCINATE PF 100 MG IJ SOLR
25.0000 mg | Freq: Once | INTRAMUSCULAR | Status: AC
  Administered 2016-08-24: 25 mg via INTRAVENOUS
  Filled 2016-08-24: qty 2

## 2016-08-24 NOTE — Progress Notes (Signed)
13:20 - patient complains of ears burning; stopped Rituxan, increased fluid, checked vital signs (see flowsheet), called Dr. Rogue Bussing - he ordered 25 mg of Solu-Cortef, continue to monitor symptoms//vital signs, wait fifteen minutes and call with update. LJ  13:30 - patient reports ears still burning, but not as bad, no new symptoms.  13:35 - patient reports ears still burning LJ  13:43 - patient now complaining with mild chest pain, continued mild burning of ears.  When asked patient states more of a chest tightness than a pain.  Patient reports no shortness of breath.  Spoke with Dr. Rogue Bussing, he ordered Solu-Cortef 25 mg and Benadryl 25 mg IV, wait thirty minutes and restart if symptoms resolved. LJ

## 2016-08-24 NOTE — Progress Notes (Signed)
1423- 30 minutes post IV Benadryl and Solu-Cortef. Patient's vital signs stable. Patient states, "I do not have any chest tightness and my ears do not burn anymore. I can feel my heart beating. It still feels a little fluttery, but that could be my anxiety." MD, Dr. Rogue Bussing, notified via telephone. Per MD order: restart Rituxan at previous rate and continue with Rituxan titration protocol. Rituxan restarted at 1428. Continue to monitor patient for any further signs/symptoms.  1705- All signs/symptoms resolved. Patient does not have any further complaints at this time. Rituxan completed at 1705. MD, Dr. Rogue Bussing, notified via telephone. Patient discharged to home.

## 2016-08-24 NOTE — Progress Notes (Signed)
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 She was seen by Dr. Rogue Bussing in December 2017 after she presented with worsening abdominal pain that has been ongoing for the last few weeks and progressively getting worse.she had one episode of diarrhea in the past which lasted for about 3 days and then it stopped. Patient has had chronic sweats but does not report any drenching night sweats. Appetite has been normal and denies any unintentional weight loss.  Retroperitoneal lymph node biopsy showed diffuse large B-cell lymphoma. FISH studies are positive for BCL-2 and BCL 6 and myc making this a triple hit lymphoma   PET/CT showed Bulky hypermetabolic abdominal retroperitoneal lymphadenopathy, andmild hypermetabolic left pelvic internal iliac lymphadenopathy,consistent with lymphoma. No hypermetabolic lymphadenopathy within the chest or neck. Incidental findings include chronic interstitial lung disease, hiatal hernia, diverticulosis, and aortic atherosclerosis.  Hepatitis B and C testing was negative and echocardiogram was within normal limits.   Interval history- patient reports that her abdominal pain is much better especially after she took for 5 days of prednisone. She is continuing to need only half a tablet of oxycodone every 6 hours for her pain and it is relatively well controlled. She otherwise feels well today and reports  no fevers or loss of appetite  ECOG PS- 1 Pain scale- 2 Opioid associated constipation- yes  Review of systems- Review of Systems  Constitutional: Negative for chills, fever, malaise/fatigue and weight loss.  HENT: Negative for congestion, ear discharge and nosebleeds.   Eyes: Negative for blurred vision.  Respiratory: Negative for cough, hemoptysis, sputum production, shortness of breath and wheezing.   Cardiovascular: Negative for chest pain, palpitations, orthopnea and claudication.  Gastrointestinal: Positive for abdominal pain. Negative for blood in stool, constipation, diarrhea, heartburn, melena, nausea and vomiting.  Genitourinary: Negative for dysuria, flank pain, frequency, hematuria and urgency.  Musculoskeletal: Negative for back pain, joint pain and myalgias.  Skin: Negative for rash.  Neurological: Negative for dizziness, tingling, focal weakness, seizures, weakness and headaches.  Endo/Heme/Allergies: Does not bruise/bleed easily.  Psychiatric/Behavioral: Negative for depression and suicidal ideas. The patient does not have insomnia.      Current treatment- RCHOP to start today  Allergies  Allergen Reactions  . Cefuroxime Axetil Diarrhea    Other reaction(s): Abdominal Pain  . Doxycycline     Other reaction(s): Abdominal Pain severe     Past Medical History:  Diagnosis Date  . Diabetes mellitus without complication ([place])   . History of gastric ulcer   . Hypercholesterolemia   . Hypertension   . ILD (interstitial lung disease) ([place])    8 yrs ago  . Lymphadenopathy      Past Surgical History:  Procedure Laterality Date  . BREAST BIOPSY Left 2010   neg  . HERNIA REPAIR    . PARTIAL HYSTERECTOMY     age 54  . PERIPHERAL VASCULAR CATHETERIZATION N/A 08/22/2016   Procedure: [name];  Surgeon: [name], MD;  Location: [facility]  INVASIVE CV LAB;  Service: Cardiovascular;  Laterality: N/A;  . TONSILLECTOMY      Social History   Social  History  . Marital status: Married    Spouse name: N/A  . Number of children: N/A  . Years of education: N/A   Occupational History  . Not on file.   Social History Main Topics  . Smoking status: Former Smoker    Quit date: 08/21/1958  . Smokeless tobacco: Never Used  . Alcohol use No  . Drug use: No  . Sexual activity: Not on file   Other Topics Concern  . Not on file   Social History Narrative  . No narrative on file    Family History  Problem Relation Age of Onset  . Heart disease Mother   . Heart disease Father   . Heart disease Brother      Current Outpatient Prescriptions:  .  allopurinol (ZYLOPRIM) 300 MG tablet, Take 1 tablet (300 mg total) by mouth daily., Disp: 30 tablet, Rfl: 0 .  latanoprost (XALATAN) 0.005 % ophthalmic solution, Place 1 drop into both eyes at bedtime. , Disp: , Rfl:  .  lidocaine-prilocaine (EMLA) cream, Apply cream 1 hour before chemotherapy treatment, place small amount of saran wrap over cream to protect clothing., Disp: 30 g, Rfl: 1 .  losartan-hydrochlorothiazide (HYZAAR) 100-12.5 MG tablet, Take 1 tablet by mouth daily., Disp: , Rfl:  .  Magnesium 400 MG TABS, Take 1 tablet by mouth daily., Disp: , Rfl:  .  metFORMIN (GLUCOPHAGE) 500 MG tablet, Take 1 tablet by mouth daily with breakfast. , Disp: , Rfl:  .  oxyCODONE (ROXICODONE) 5 MG immediate release tablet, Take 0.5 tablets (2.5 mg total) by mouth every 6 (six) hours as needed for breakthrough pain., Disp: 60 tablet, Rfl: 0 .  pantoprazole (PROTONIX) 40 MG tablet, Take 1 tablet by mouth 2 (two) times daily., Disp: , Rfl:  .  pravastatin (PRAVACHOL) 40 MG tablet, Take 1 tablet by mouth daily., Disp: , Rfl:  .  b complex vitamins tablet, Take 1 tablet by mouth daily., Disp: , Rfl:  No current facility-administered medications for this visit.   Facility-Administered Medications Ordered in Other Visits:  .  heparin lock flush 100 unit/mL, 500 Units, Intravenous, Once, Sindy Guadeloupe, MD .   sodium chloride flush (NS) 0.9 % injection 10 mL, 10 mL, Intravenous, PRN, Sindy Guadeloupe, MD, 10 mL at 08/24/16 0855  Physical exam:  Vitals:   08/24/16 0924  BP: 137/80  Pulse: (!) 108  Temp: 98.5 F (36.9 C)  TempSrc: Tympanic  Weight: 153 lb 14.1 oz (69.8 kg)   Physical Exam  Constitutional: She is oriented to person, place, and time and well-developed, well-nourished, and in no distress.  HENT:  Head: Normocephalic and atraumatic.  Eyes: EOM are normal. Pupils are equal, round, and reactive to light.  Neck: Normal range of motion.  Cardiovascular: Normal rate, regular rhythm and normal heart sounds.   Pulmonary/Chest: Effort normal and breath sounds normal.  Port noted over the right chest wall. No evidence of infection or inflammation.  Abdominal: Soft. Bowel sounds are normal.  Neurological: She is alert and oriented to person, place, and time.  Skin: Skin is warm and dry.     CMP Latest Ref Rng & Units 08/24/2016  Glucose 65 - 99 mg/dL 151(H)  BUN 6 - 20 mg/dL 9  Creatinine 0.44 - 1.00 mg/dL 0.66  Sodium 135 - 145 mmol/L 138  Potassium 3.5 -  5.1 mmol/L 3.2(L)  Chloride 101 - 111 mmol/L 103  CO2 22 - 32 mmol/L 25  Calcium 8.9 - 10.3 mg/dL 9.0  Total Protein 6.5 - 8.1 g/dL 7.2  Total Bilirubin 0.3 - 1.2 mg/dL 0.7  Alkaline Phos 38 - 126 U/L 43  AST 15 - 41 U/L 21  ALT 14 - 54 U/L 14   CBC Latest Ref Rng & Units 08/24/2016  WBC 3.6 - 11.0 K/uL 6.8  Hemoglobin 12.0 - 16.0 g/dL 13.0  Hematocrit 35.0 - 47.0 % 38.7  Platelets 150 - 440 K/uL 227    No images are attached to the encounter.  Ct Abdomen Pelvis W Contrast  Result Date: 07/26/2016 CLINICAL DATA:  Left lower quadrant pain with diarrhea x 11 days EXAM: CT ABDOMEN AND PELVIS WITH CONTRAST TECHNIQUE: Multidetector CT imaging of the abdomen and pelvis was performed using the standard protocol following bolus administration of intravenous contrast. CONTRAST:  14m ISOVUE-300 IOPAMIDOL (ISOVUE-300) INJECTION 61%  COMPARISON:  11/27/2005 upper GI with small bowel series FINDINGS: Lower chest: Small hiatal hernia. Subpleural areas of fibrosis and bibasilar scarring. Moderate-sized hiatal hernia. Top the visualized cardiac chambers are within normal limits. No pericardial effusion. Hepatobiliary: Nonenhancing hypodensities are noted within the left and right hepatic lobes the largest is in the left hepatic lobe measuring up to 7 mm. Findings are in keeping with cysts but are too small to further characterize. No enhancement on delayed images. Pancreas: Unremarkable.  No ductal dilatation. Spleen: No splenomegaly. Adrenals/Urinary Tract: Normal bilateral adrenal glands. The kidneys demonstrate no obstructive uropathy or enhancing mass. Stomach/Bowel: Normal bowel rotation. No bowel obstruction. No bowel fold thickening, wall thickening or inflammation. Normal-appearing appendix. Vascular/Lymphatic: There is a bulky retroperitoneal mass in the upper abdomen measuring 8 x 9.3 x 10.6 cm in craniocaudad by AP by transverse dimension outlining the aorta, displacing the IVC laterally to the right and enveloping the renal arteries and veins. There is a mild to moderate engorgement of the left renal vein from extrinsic mass effect as well as on the right renal vein. This upper margin of this mass starts at the SMA without involving the SMA with its caudal margin approximate 3.8 cm above the aortic bifurcation. Small subcentimeter porta hepatic and peripancreatic lymph nodes on the order of 7 mm or less are noted. No pelvic lymphadenopathy identified. The aorta is atherosclerotic without aneurysm or dissection. Reproductive: No adnexal mass. The patient appears to be status post hysterectomy. Other: No ascites or free air. Musculoskeletal: Degenerative disc disease L4-5 and L5-S1. Schmorl's node involving the T12 superior endplate. No frank bone destruction or acute fracture. IMPRESSION: Bulky retroperitoneal lymphadenopathy in the upper  abdomen measuring 8 x 9.3 x 10.6 cm enveloping the aorta and both renal arteries and displacing the IVC and rena with l veins bilaterally. Findings would be in keeping with lymphoma. No splenomegaly. Hysterectomy. Moderate-sized hiatal hernia. Nonenhancing sub cm hepatic hypodensities likely representing cysts. These results will be called to the ordering clinician or representative by the Radiologist Assistant, and communication documented in the PACS or zVision Dashboard. Electronically Signed   By: DAshley RoyaltyM.D.   On: 07/26/2016 13:43   Nm Pet Image Initial (pi) Skull Base To Thigh  Result Date: 08/03/2016 CLINICAL DATA:  Initial treatment strategy for lymphoma. EXAM: NUCLEAR MEDICINE PET SKULL BASE TO THIGH TECHNIQUE: 12.1 mCi F-18 FDG was injected intravenously. Full-ring PET imaging was performed from the skull base to thigh after the radiotracer. CT data was obtained and used  for attenuation correction and anatomic localization. FASTING BLOOD GLUCOSE:  Value: 111 mg/dl COMPARISON:  AP CT on 07/26/2016 FINDINGS: NECK No hypermetabolic lymph nodes in the neck. CHEST No hypermetabolic mediastinal or hilar nodes. No suspicious pulmonary nodules on the CT scan. Chronic interstitial lung disease again noted. Small to moderate hiatal hernia is stable. ABDOMEN/PELVIS No abnormal hypermetabolic activity within the liver, pancreas, adrenal glands, or spleen. Bulky abdominal retroperitoneal lymphadenopathy measures 9.4 x 10.6 cm, without change compared to recent study. This is hypermetabolic with SUV max of 71.6. Hypermetabolic pelvic lymphadenopathy is seen left internal iliac chain, measuring 2.2 cm in short axis on image 201/3. This is hypermetabolic, with SUV max of 18.6. Descending colon diverticulosis noted, without evidence of diverticulitis. Previous hysterectomy. Aortic atherosclerosis. SKELETON No focal hypermetabolic activity to suggest skeletal metastasis. IMPRESSION: Bulky hypermetabolic abdominal  retroperitoneal lymphadenopathy, and mild hypermetabolic left pelvic internal iliac lymphadenopathy, consistent with lymphoma. No hypermetabolic lymphadenopathy within the chest or neck. Incidental findings include chronic interstitial lung disease, hiatal hernia, diverticulosis, and aortic atherosclerosis. Electronically Signed   By: Earle Gell M.D.   On: 08/03/2016 14:43   Ct Biopsy  Result Date: 08/21/2016 INDICATION: Lymphoma. EXAM: CT BIOPSY MEDICATIONS: None. ANESTHESIA/SEDATION: Moderate (conscious) sedation was employed during this procedure. A total of Versed 2.5 mg and Fentanyl 100 mcg was administered intravenously. Moderate Sedation Time: 17 minutes. The patient's level of consciousness and vital signs were monitored continuously by radiology nursing throughout the procedure under my direct supervision. COMPLICATIONS: None immediate. PROCEDURE: Informed written consent was obtained from the patient after a thorough discussion of the procedural risks, benefits and alternatives. All questions were addressed. Maximal Sterile Barrier Technique was utilized including mask, sterile gowns, sterile gloves, sterile drape, hand hygiene and skin antiseptic. A timeout was performed prior to the initiation of the procedure. Patient was placed prone on the CT scanner. Right posterior flank region was prepped and draped using sterile technique. Local anesthetic was applied. Under CT guidance, 22 gauge spinal needle was directed toward posterior margin of right iliac bone. Lidocaine was administered subperiosteally. Needle was removed. Under CT guidance, trocar was directed through posterior margin of right iliac bone. Bone marrow aspirates with and without heparin were administered and given to pathology technologist who was present. Then, bone marrow core biopsy was obtained, and this sample was also given to pathology technologist. Needle was removed and appropriate dressing was applied. No immediate  complications were noted. IMPRESSION: Under CT guidance, successful bone marrow aspiration and core biopsy was performed of right iliac bone. Electronically Signed   By: Marijo Conception, M.D.   On: 08/21/2016 13:31   Ct Biopsy  Result Date: 08/08/2016 INDICATION: 71 year-old with the massive retroperitoneal lymphadenopathy and needs a tissue diagnosis. EXAM: CT-GUIDED RETROPERITONEAL LYMPH NODE BIOPSY MEDICATIONS: None. ANESTHESIA/SEDATION: Moderate (conscious) sedation was employed during this procedure. A total of Versed 1.0 mg and Fentanyl 50 mcg was administered intravenously. Moderate Sedation Time: 28 minutes. The patient's level of consciousness and vital signs were monitored continuously by radiology nursing throughout the procedure under my direct supervision. FLUOROSCOPY TIME:  None COMPLICATIONS: None immediate. PROCEDURE: Informed written consent was obtained from the patient after a thorough discussion of the procedural risks, benefits and alternatives. All questions were addressed. Maximal Sterile Barrier Technique was utilized including caps, mask, sterile gowns, sterile gloves, sterile drape, hand hygiene and skin antiseptic. A timeout was performed prior to the initiation of the procedure. Patient was placed prone and CT images of the abdomen were  obtained. Left flank was prepped and draped in sterile fashion. Skin and soft tissues were anesthetized with 1% lidocaine. 23 gauge coaxial needle directed into the large retroperitoneal soft tissue mass with CT guidance from a left paraspinal approach. Needle position was confirmed along the posterior aspect of the mass. A total of 7 core biopsies were obtained with an 18 gauge device. Specimens placed on the Telfa pad with saline. Needle was removed without complication. Bandage placed over the puncture site. FINDINGS: Again noted is massive amount of soft tissue in the retroperitoneum compatible with extensive lymphadenopathy. Needle was positioned  in the left posterior aspect of this retroperitoneal mass. No significant bleeding following the core biopsies. IMPRESSION: Successful CT-guided core biopsies of the retroperitoneal lymphadenopathy/mass. Electronically Signed   By: Markus Daft M.D.   On: 08/08/2016 17:27     Assessment and plan- Patient is a 71 y.o. female with a history of at least stage II diffuse large B-cell lymphoma triple hit final stage pending bone marrow biopsy)  1. I discussed the results of the [place] testing with the patient today which showed that patient does have a triple hit dlbcl thereby making this an aggressive disease. I have also discussed her case with Dr. Rogue Bussing was her primary oncologist. Plan is to proceed with cycle #1 of our CHOP today and switch her to dose adjusted R EPOCH starting cycle 2. Patient is already received 5 days of 100 mg prednisone and hence she will only be getting CHO today without prednisone. Continue allopurinol for tumor lysis syndrome prophylaxis. She has a port in place.   2. Given that she has an aggressive dlbcl along with retroperitoneal lymphadenopathy we may have to consider CNS prophylaxis if she has bone marrow involvement. She will see Dr. Rogue Bussing in 1 week's time with a repeat CBC and CMP to see how she has tolerated her chemotherapy and to discuss the results of the bone marrow biopsy. Further plans on switching her to dose adjusted [place] will be made by him during the next visit    3. Neoplasm associated pain - continue when necessary oxycodone  Visit Diagnosis 1. Diffuse large B-cell lymphoma of intra-abdominal lymph nodes (HCC)      Dr. Randa Evens, MD, MPH [facility] Pager- [phone] 08/24/2016 9:59 AM

## 2016-08-29 ENCOUNTER — Encounter: Payer: Self-pay | Admitting: Oncology

## 2016-08-29 ENCOUNTER — Telehealth: Payer: Self-pay | Admitting: *Deleted

## 2016-08-29 MED ORDER — GABAPENTIN 100 MG PO CAPS: 100.0000 mg | ORAL_CAPSULE | Freq: Three times a day (TID) | ORAL | 1 refills | Status: DC

## 2016-08-29 NOTE — Telephone Encounter (Signed)
Per VO Dr Rogue Bussing Gabapentin 100 mg tid Patient informed anda will pick up Rx at pharmacy

## 2016-08-29 NOTE — Telephone Encounter (Signed)
She had her first R CHOP treatment on 08/24/16 for NHL, she developed burning in her legs and hip  And headache and ear ache yesterday and it is not going away. Asking what can be done. She denies fever, eating and drinking ok. Please advise

## 2016-08-30 ENCOUNTER — Encounter: Payer: Self-pay | Admitting: Internal Medicine

## 2016-08-30 DIAGNOSIS — C8333 Diffuse large B-cell lymphoma, intra-abdominal lymph nodes: Secondary | ICD-10-CM | POA: Insufficient documentation

## 2016-08-31 ENCOUNTER — Inpatient Hospital Stay (HOSPITAL_BASED_OUTPATIENT_CLINIC_OR_DEPARTMENT_OTHER): Payer: Medicare Other | Admitting: Internal Medicine

## 2016-08-31 ENCOUNTER — Inpatient Hospital Stay: Payer: Medicare Other

## 2016-08-31 ENCOUNTER — Ambulatory Visit: Payer: Medicare Other | Admitting: Oncology

## 2016-08-31 ENCOUNTER — Encounter: Payer: Self-pay | Admitting: Oncology

## 2016-08-31 VITALS — BP 121/72 | HR 123 | Temp 99.3°F | Wt 145.1 lb

## 2016-08-31 DIAGNOSIS — Z87891 Personal history of nicotine dependence: Secondary | ICD-10-CM

## 2016-08-31 DIAGNOSIS — K449 Diaphragmatic hernia without obstruction or gangrene: Secondary | ICD-10-CM

## 2016-08-31 DIAGNOSIS — C8333 Diffuse large B-cell lymphoma, intra-abdominal lymph nodes: Secondary | ICD-10-CM

## 2016-08-31 DIAGNOSIS — E871 Hypo-osmolality and hyponatremia: Secondary | ICD-10-CM

## 2016-08-31 DIAGNOSIS — I10 Essential (primary) hypertension: Secondary | ICD-10-CM

## 2016-08-31 DIAGNOSIS — R5383 Other fatigue: Secondary | ICD-10-CM | POA: Diagnosis not present

## 2016-08-31 DIAGNOSIS — I2510 Atherosclerotic heart disease of native coronary artery without angina pectoris: Secondary | ICD-10-CM

## 2016-08-31 DIAGNOSIS — E7800 Pure hypercholesterolemia, unspecified: Secondary | ICD-10-CM

## 2016-08-31 DIAGNOSIS — E119 Type 2 diabetes mellitus without complications: Secondary | ICD-10-CM

## 2016-08-31 DIAGNOSIS — R197 Diarrhea, unspecified: Secondary | ICD-10-CM | POA: Diagnosis not present

## 2016-08-31 DIAGNOSIS — Z8711 Personal history of peptic ulcer disease: Secondary | ICD-10-CM

## 2016-08-31 DIAGNOSIS — Z7984 Long term (current) use of oral hypoglycemic drugs: Secondary | ICD-10-CM

## 2016-08-31 DIAGNOSIS — G893 Neoplasm related pain (acute) (chronic): Secondary | ICD-10-CM

## 2016-08-31 DIAGNOSIS — J849 Interstitial pulmonary disease, unspecified: Secondary | ICD-10-CM

## 2016-08-31 DIAGNOSIS — Z79899 Other long term (current) drug therapy: Secondary | ICD-10-CM

## 2016-08-31 LAB — CBC WITH DIFFERENTIAL/PLATELET
BASOS ABS: 0 10*3/uL (ref 0–0.1)
BASOS PCT: 2 %
Eosinophils Absolute: 0.2 10*3/uL (ref 0–0.7)
Eosinophils Relative: 8 %
HCT: 40.3 % (ref 35.0–47.0)
HEMOGLOBIN: 13.5 g/dL (ref 12.0–16.0)
LYMPHS PCT: 14 %
Lymphs Abs: 0.4 10*3/uL — ABNORMAL LOW (ref 1.0–3.6)
MCH: 27.4 pg (ref 26.0–34.0)
MCHC: 33.4 g/dL (ref 32.0–36.0)
MCV: 82.1 fL (ref 80.0–100.0)
MONO ABS: 0.1 10*3/uL — AB (ref 0.2–0.9)
Monocytes Relative: 3 %
NEUTROS ABS: 1.9 10*3/uL (ref 1.4–6.5)
NEUTROS PCT: 73 %
Platelets: 166 10*3/uL (ref 150–440)
RBC: 4.91 MIL/uL (ref 3.80–5.20)
RDW: 14.7 % — ABNORMAL HIGH (ref 11.5–14.5)
WBC: 2.6 10*3/uL — AB (ref 3.6–11.0)

## 2016-08-31 LAB — COMPREHENSIVE METABOLIC PANEL
ALBUMIN: 4.1 g/dL (ref 3.5–5.0)
ALT: 19 U/L (ref 14–54)
AST: 22 U/L (ref 15–41)
Alkaline Phosphatase: 56 U/L (ref 38–126)
Anion gap: 10 (ref 5–15)
BUN: 12 mg/dL (ref 6–20)
CO2: 26 mmol/L (ref 22–32)
CREATININE: 0.63 mg/dL (ref 0.44–1.00)
Calcium: 9 mg/dL (ref 8.9–10.3)
Chloride: 93 mmol/L — ABNORMAL LOW (ref 101–111)
GFR calc Af Amer: >60 mL/min (ref >60)
GFR calc non Af Amer: >60 mL/min (ref >60)
GLUCOSE: 155 mg/dL — AB (ref 65–99)
POTASSIUM: 3.4 mmol/L — AB (ref 3.5–5.1)
Sodium: 129 mmol/L — ABNORMAL LOW (ref 135–145)
TOTAL PROTEIN: 7.4 g/dL (ref 6.5–8.1)
Total Bilirubin: 0.7 mg/dL (ref 0.3–1.2)

## 2016-08-31 NOTE — Progress Notes (Signed)
Patient here today for follow up.  Patient states that she has burning sensation in legs and hips that started 2 days ago

## 2016-08-31 NOTE — Progress Notes (Signed)
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  female with a new diagnosis of diffuse large B-cell lymphoma is here to review her final pathology/ plan of care.  Patient had a first cycle of R CHOP chemotherapy one week ago. Patient complains of fatigue postchemotherapy. SHe complains of mild diarrhea which is currently improved with Imodium.   ROS: A complete 10 point review of system is done which is negative except mentioned above in history of present illness  MEDICAL HISTORY:  Past Medical History:  Diagnosis Date  . Diabetes mellitus without complication ([place])   . History of gastric ulcer   . Hypercholesterolemia   . Hypertension   . ILD (interstitial lung disease) ([place])    8 yrs ago  . Lymphadenopathy     SURGICAL HISTORY: Past Surgical History:  Procedure Laterality Date  . BREAST BIOPSY Left 2010   neg  . HERNIA REPAIR    . PARTIAL HYSTERECTOMY     age 64  . PERIPHERAL VASCULAR CATHETERIZATION N/A 08/22/2016   Procedure: [name];  Surgeon: [name], MD;  Location: [place] [facility];  Service: Cardiovascular;   Laterality: N/A;  . TONSILLECTOMY      SOCIAL HISTORY: quit smoking 40 years ago; human resources retd; in [place]; no alcohol Social History   Social History  . Marital status: Married    Spouse name: N/A  . Number of children: N/A  . Years of education: N/A   Occupational History  . Not on file.   Social History Main Topics  . Smoking status: Former Smoker    Quit date: 08/21/1958  . Smokeless tobacco: Never Used  . Alcohol use No  . Drug use: No  . Sexual activity: Not on file   Other Topics Concern  . Not on file   Social History Narrative  . No narrative on file    FAMILY HISTORY: Family History  Problem Relation Age of Onset  . Heart disease Mother   . Heart disease Father   . Heart disease Brother     ALLERGIES:  is allergic to cefuroxime axetil and doxycycline.  MEDICATIONS:  Current Outpatient Prescriptions  Medication Sig Dispense Refill  . allopurinol (ZYLOPRIM) 300 MG tablet Take 1 tablet (300 mg total) by mouth daily. 30 tablet 0  . b complex vitamins tablet Take 1 tablet by mouth daily.    Marland Kitchen gabapentin (NEURONTIN) 100 MG capsule Take 1 capsule (100 mg total) by mouth 3 (three) times daily. 90 capsule 1  . latanoprost (XALATAN) 0.005 % ophthalmic solution Place 1 drop into both eyes at  bedtime.     . lidocaine-prilocaine (EMLA) cream Apply cream 1 hour before chemotherapy treatment, place small amount of saran wrap over cream to protect clothing. 30 g 1  . losartan-hydrochlorothiazide (HYZAAR) 100-12.5 MG tablet Take 1 tablet by mouth daily.    . Magnesium 400 MG TABS Take 1 tablet by mouth daily.    . metFORMIN (GLUCOPHAGE) 500 MG tablet Take 1 tablet by mouth daily with breakfast.     . ondansetron (ZOFRAN) 8 MG tablet Take 1 tablet (8 mg total) by mouth every 8 (eight) hours as needed for nausea or vomiting. 30 tablet 0  . oxyCODONE (ROXICODONE) 5 MG immediate release tablet Take 0.5 tablets (2.5 mg total) by mouth every 6 (six) hours as needed  for breakthrough pain. 60 tablet 0  . pantoprazole (PROTONIX) 40 MG tablet Take 1 tablet by mouth 2 (two) times daily.    . pravastatin (PRAVACHOL) 40 MG tablet Take 1 tablet by mouth daily.    . prochlorperazine (COMPAZINE) 10 MG tablet Take 1 tablet (10 mg total) by mouth every 6 (six) hours as needed for nausea, vomiting or refractory nausea / vomiting. 30 tablet 0   No current facility-administered medications for this visit.       Marland Kitchen  PHYSICAL EXAMINATION: ECOG PERFORMANCE STATUS: 1 - Symptomatic but completely ambulatory  Vitals:   08/31/16 1054  BP: 121/72  Pulse: (!) 123  Temp: 99.3 F (37.4 C)   Filed Weights   08/31/16 1054  Weight: 145 lb 2 oz (65.8 kg)    GENERAL: Well-nourished well-developed; Alert, no distress and comfortable.   With her husband. EYES: no pallor or icterus OROPHARYNX: no thrush or ulceration; good dentition  NECK: supple, no masses felt LYMPH:  no palpable lymphadenopathy in the cervical, axillary or inguinal regions LUNGS: clear to auscultation and  No wheeze or crackles HEART/CVS: regular rate & rhythm and no murmurs; No lower extremity edema ABDOMEN: abdomen soft, mild tenderness on deep palpation and normal bowel sounds. No hepatosplenomegaly Musculoskeletal:no cyanosis of digits and no clubbing  PSYCH: alert & oriented x 3 with fluent speech NEURO: no focal motor/sensory deficits SKIN:  no rashes or significant lesions  LABORATORY DATA:  I have reviewed the data as listed Lab Results  Component Value Date   WBC 2.6 (L) 08/31/2016   HGB 13.5 08/31/2016   HCT 40.3 08/31/2016   MCV 82.1 08/31/2016   PLT 166 08/31/2016    Recent Labs  07/31/16 0947 08/16/16 0853 08/24/16 0855 08/31/16 1022  NA 136 137 138 129*  K 3.7 3.6 3.2* 3.4*  CL 100* 103 103 93*  CO2 25 25 25 26   GLUCOSE 113* 118* 151* 155*  BUN 17 14 9 12   CREATININE 0.54 0.65 0.66 0.63  CALCIUM 9.3 9.3 9.0 9.0  GFRNONAA >60 >60 >60 >60  GFRAA >60 >60 >60 >60   PROT 8.1  --  7.2 7.4  ALBUMIN 4.4  --  4.2 4.1  AST 17  --  21 22  ALT 10*  --  14 19  ALKPHOS 52  --  43 56  BILITOT 0.7  --  0.7 0.7    RADIOGRAPHIC STUDIES: I have personally reviewed the radiological images as listed and agreed with the findings in the report. Nm Pet Image Initial (pi) Skull Base To Thigh  Result Date: 08/03/2016 CLINICAL DATA:  Initial treatment strategy for lymphoma. EXAM: NUCLEAR MEDICINE PET SKULL BASE TO THIGH TECHNIQUE: 12.1 mCi F-18 FDG was injected intravenously. Full-ring  PET imaging was performed from the skull base to thigh after the radiotracer. CT data was obtained and used for attenuation correction and anatomic localization. FASTING BLOOD GLUCOSE:  Value: 111 mg/dl COMPARISON:  AP CT on 07/26/2016 FINDINGS: NECK No hypermetabolic lymph nodes in the neck. CHEST No hypermetabolic mediastinal or hilar nodes. No suspicious pulmonary nodules on the CT scan. Chronic interstitial lung disease again noted. Small to moderate hiatal hernia is stable. ABDOMEN/PELVIS No abnormal hypermetabolic activity within the liver, pancreas, adrenal glands, or spleen. Bulky abdominal retroperitoneal lymphadenopathy measures 9.4 x 10.6 cm, without change compared to recent study. This is hypermetabolic with SUV max of [id number]. Hypermetabolic pelvic lymphadenopathy is seen left internal iliac chain, measuring 2.2 cm in short axis on image 201/3. This is hypermetabolic, with SUV max of 18.6. Descending colon diverticulosis noted, without evidence of diverticulitis. Previous hysterectomy. Aortic atherosclerosis. SKELETON No focal hypermetabolic activity to suggest skeletal metastasis. IMPRESSION: Bulky hypermetabolic abdominal retroperitoneal lymphadenopathy, and mild hypermetabolic left pelvic internal iliac lymphadenopathy, consistent with lymphoma. No hypermetabolic lymphadenopathy within the chest or neck. Incidental findings include chronic interstitial lung disease, hiatal hernia,  diverticulosis, and aortic atherosclerosis. Electronically Signed   By: Earle Gell M.D.   On: 08/03/2016 14:43   Ct Biopsy  Result Date: 08/21/2016 INDICATION: Lymphoma. EXAM: CT BIOPSY MEDICATIONS: None. ANESTHESIA/SEDATION: Moderate (conscious) sedation was employed during this procedure. A total of Versed 2.5 mg and Fentanyl 100 mcg was administered intravenously. Moderate Sedation Time: 17 minutes. The patient's level of consciousness and vital signs were monitored continuously by radiology nursing throughout the procedure under my direct supervision. COMPLICATIONS: None immediate. PROCEDURE: Informed written consent was obtained from the patient after a thorough discussion of the procedural risks, benefits and alternatives. All questions were addressed. Maximal Sterile Barrier Technique was utilized including mask, sterile gowns, sterile gloves, sterile drape, hand hygiene and skin antiseptic. A timeout was performed prior to the initiation of the procedure. Patient was placed prone on the CT scanner. Right posterior flank region was prepped and draped using sterile technique. Local anesthetic was applied. Under CT guidance, 22 gauge spinal needle was directed toward posterior margin of right iliac bone. Lidocaine was administered subperiosteally. Needle was removed. Under CT guidance, trocar was directed through posterior margin of right iliac bone. Bone marrow aspirates with and without heparin were administered and given to pathology technologist who was present. Then, bone marrow core biopsy was obtained, and this sample was also given to pathology technologist. Needle was removed and appropriate dressing was applied. No immediate complications were noted. IMPRESSION: Under CT guidance, successful bone marrow aspiration and core biopsy was performed of right iliac bone. Electronically Signed   By: Marijo Conception, M.D.   On: 08/21/2016 13:31   Ct Biopsy  Result Date: 08/08/2016 INDICATION: 71  year-old with the massive retroperitoneal lymphadenopathy and needs a tissue diagnosis. EXAM: CT-GUIDED RETROPERITONEAL LYMPH NODE BIOPSY MEDICATIONS: None. ANESTHESIA/SEDATION: Moderate (conscious) sedation was employed during this procedure. A total of Versed 1.0 mg and Fentanyl 50 mcg was administered intravenously. Moderate Sedation Time: 28 minutes. The patient's level of consciousness and vital signs were monitored continuously by radiology nursing throughout the procedure under my direct supervision. FLUOROSCOPY TIME:  None COMPLICATIONS: None immediate. PROCEDURE: Informed written consent was obtained from the patient after a thorough discussion of the procedural risks, benefits and alternatives. All questions were addressed. Maximal Sterile Barrier Technique was utilized including caps, mask, sterile gowns, sterile gloves, sterile drape, hand hygiene and skin antiseptic. A timeout was  performed prior to the initiation of the procedure. Patient was placed prone and CT images of the abdomen were obtained. Left flank was prepped and draped in sterile fashion. Skin and soft tissues were anesthetized with 1% lidocaine. 5 gauge coaxial needle directed into the large retroperitoneal soft tissue mass with CT guidance from a left paraspinal approach. Needle position was confirmed along the posterior aspect of the mass. A total of 7 core biopsies were obtained with an 18 gauge device. Specimens placed on the Telfa pad with saline. Needle was removed without complication. Bandage placed over the puncture site. FINDINGS: Again noted is massive amount of soft tissue in the retroperitoneum compatible with extensive lymphadenopathy. Needle was positioned in the left posterior aspect of this retroperitoneal mass. No significant bleeding following the core biopsies. IMPRESSION: Successful CT-guided core biopsies of the retroperitoneal lymphadenopathy/mass. Electronically Signed   By: Markus Daft M.D.   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 However this is stage II; rather than the advanced stage. Patient is currently status post R CHOP chemotherapy cycle #1 appx1 week ago. He tolerated treatment well except for fatigue/diarrhea.  # Discussed low risk revised IPI cure rate of 80%; however triple hit the cure is lower ~60%. However, I strongly recommend continued treatment. Will need interm PET scan after 2-3 cycles. Overall patient prognosis will depend upon response to induction chemotherapy.   # Discussed the need to change chemotherapy to dose adjusted R-EPOCH with growth factor support. Discussed both inpatient and outpatient options- patient seems to be interested in inpatient chemotherapy.  # Diarrhea-grade 1 continue imodium when necessary  # Lumbar puncture- given the aggressive lymphoma I would recommend a lumbar puncture. Clinically less likely to have involvement of CNS.  # Hyponatremia-129-  likely from dehydration. Recommend increased fluid intake with electrolytes.  # Given the unusual aggressive nature of the disease I would recommend a second opinion at [facility]. She is interested.  # Patient will have labs on the 26th; LP; see me on the 29th/no labs chemotherapy in the hospital.  All questions were answered. The patient knows to call the clinic with any problems, questions or concerns.    Cammie Sickle, MD 08/31/2016 5:14 PM

## 2016-08-31 NOTE — Assessment & Plan Note (Addendum)
#   STAGE II DLBCL TRIPLE HIT- discussed the aggressive nature of the malignancy. However this is stage II; rather than the advanced stage. Patient is currently status post R CHOP chemotherapy cycle #1 appx1 week ago. He tolerated treatment well except for fatigue/diarrhea.  # Discussed low risk revised IPI cure rate of 80%; however triple hit the cure is lower ~60%. However, I strongly recommend continued treatment. Will need interm PET scan after 2-3 cycles. Overall patient prognosis will depend upon response to induction chemotherapy.   # Discussed the need to change chemotherapy to dose adjusted R-EPOCH with growth factor support. Discussed both inpatient and outpatient options- patient seems to be interested in inpatient chemotherapy.  # Diarrhea-grade 1 continue imodium when necessary  # Lumbar puncture- given the aggressive lymphoma I would recommend a lumbar puncture. Clinically less likely to have involvement of CNS.  # Hyponatremia-129-  likely from dehydration. Recommend increased fluid intake with electrolytes.  # Given the unusual aggressive nature of the disease I would recommend a second opinion at [facility]. She is interested.  # Patient will have labs on the 26th; LP; see me on the 29th/no labs chemotherapy in the hospital.

## 2016-09-04 ENCOUNTER — Encounter: Payer: Self-pay | Admitting: *Deleted

## 2016-09-04 ENCOUNTER — Other Ambulatory Visit: Payer: Self-pay | Admitting: Internal Medicine

## 2016-09-04 NOTE — Progress Notes (Signed)
Unit 1C and prior auth teams notified regarding upcoming admission to inpt unit on 1/29 for her next chemotherapy (Dose adjusted R-EPOCH).

## 2016-09-04 NOTE — Progress Notes (Signed)
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

## 2016-09-10 ENCOUNTER — Other Ambulatory Visit: Payer: Self-pay | Admitting: Internal Medicine

## 2016-09-14 ENCOUNTER — Other Ambulatory Visit: Payer: Self-pay | Admitting: *Deleted

## 2016-09-14 ENCOUNTER — Ambulatory Visit
Admission: RE | Admit: 2016-09-14 | Discharge: 2016-09-14 | Disposition: A | Discharge status: Home / Self Care | Payer: Medicare Other | Source: Ambulatory Visit | Attending: Internal Medicine | Admitting: Internal Medicine

## 2016-09-14 ENCOUNTER — Inpatient Hospital Stay: Payer: Medicare Other | Admitting: *Deleted

## 2016-09-14 ENCOUNTER — Telehealth: Payer: Self-pay | Admitting: *Deleted

## 2016-09-14 DIAGNOSIS — C8333 Diffuse large B-cell lymphoma, intra-abdominal lymph nodes: Secondary | ICD-10-CM | POA: Insufficient documentation

## 2016-09-14 DIAGNOSIS — R1900 Intra-abdominal and pelvic swelling, mass and lump, unspecified site: Secondary | ICD-10-CM

## 2016-09-14 DIAGNOSIS — J8410 Pulmonary fibrosis, unspecified: Secondary | ICD-10-CM

## 2016-09-14 DIAGNOSIS — E876 Hypokalemia: Secondary | ICD-10-CM

## 2016-09-14 DIAGNOSIS — R590 Localized enlarged lymph nodes: Secondary | ICD-10-CM

## 2016-09-14 LAB — CBC WITH DIFFERENTIAL/PLATELET
BASOS PCT: 2 %
Basophils Absolute: 0.1 10*3/uL (ref 0–0.1)
Eosinophils Absolute: 0 10*3/uL (ref 0–0.7)
Eosinophils Relative: 1 %
HEMATOCRIT: 34.9 % — AB (ref 35.0–47.0)
Hemoglobin: 12.1 g/dL (ref 12.0–16.0)
LYMPHS ABS: 0.7 10*3/uL — AB (ref 1.0–3.6)
Lymphocytes Relative: 14 %
MCH: 28.3 pg (ref 26.0–34.0)
MCHC: 34.6 g/dL (ref 32.0–36.0)
MCV: 81.8 fL (ref 80.0–100.0)
MONOS PCT: 21 %
Monocytes Absolute: 1 10*3/uL — ABNORMAL HIGH (ref 0.2–0.9)
NEUTROS ABS: 2.9 10*3/uL (ref 1.4–6.5)
Neutrophils Relative %: 62 %
Platelets: 302 10*3/uL (ref 150–440)
RBC: 4.26 MIL/uL (ref 3.80–5.20)
RDW: 15.1 % — AB (ref 11.5–14.5)
WBC: 4.7 10*3/uL (ref 3.6–11.0)

## 2016-09-14 LAB — COMPREHENSIVE METABOLIC PANEL
ALBUMIN: 3.6 g/dL (ref 3.5–5.0)
ALK PHOS: 63 U/L (ref 38–126)
ALT: 19 U/L (ref 14–54)
ANION GAP: 9 (ref 5–15)
AST: 20 U/L (ref 15–41)
BILIRUBIN TOTAL: 0.5 mg/dL (ref 0.3–1.2)
BUN: 6 mg/dL (ref 6–20)
CALCIUM: 9.2 mg/dL (ref 8.9–10.3)
CO2: 31 mmol/L (ref 22–32)
Chloride: 98 mmol/L — ABNORMAL LOW (ref 101–111)
Creatinine, Ser: 0.43 mg/dL — ABNORMAL LOW (ref 0.44–1.00)
GLUCOSE: 139 mg/dL — AB (ref 65–99)
POTASSIUM: 2.6 mmol/L — AB (ref 3.5–5.1)
Sodium: 138 mmol/L (ref 135–145)
TOTAL PROTEIN: 7 g/dL (ref 6.5–8.1)

## 2016-09-14 LAB — CSF CELL COUNT WITH DIFFERENTIAL
RBC Count, CSF: 0 /mm3 (ref 0–3)
Tube #: 3
WBC, CSF: 0 /mm3 (ref 0–5)

## 2016-09-14 LAB — GLUCOSE, CSF: GLUCOSE CSF: 62 mg/dL (ref 40–70)

## 2016-09-14 LAB — PROTEIN, CSF: Total  Protein, CSF: 40 mg/dL (ref 15–45)

## 2016-09-14 LAB — PROTIME-INR
INR: 0.95
PROTHROMBIN TIME: 12.7 s (ref 11.4–15.2)

## 2016-09-14 LAB — GLUCOSE, CAPILLARY: GLUCOSE-CAPILLARY: 136 mg/dL — AB (ref 65–99)

## 2016-09-14 LAB — APTT: APTT: 29 s (ref 24–36)

## 2016-09-14 MED ORDER — POTASSIUM CHLORIDE CRYS ER 20 MEQ PO TBCR: 40.0000 meq | EXTENDED_RELEASE_TABLET | Freq: Two times a day (BID) | ORAL | 0 refills | Status: DC

## 2016-09-14 MED ORDER — ACETAMINOPHEN 325 MG PO TABS
650.0000 mg | ORAL_TABLET | ORAL | Status: DC | PRN
  Filled 2016-09-14: qty 2

## 2016-09-14 MED ORDER — POTASSIUM CHLORIDE CRYS ER 20 MEQ PO TBCR
40.0000 meq | EXTENDED_RELEASE_TABLET | Freq: Once | ORAL | Status: AC
  Administered 2016-09-14: 40 meq via ORAL
  Filled 2016-09-14: qty 2

## 2016-09-14 NOTE — Telephone Encounter (Signed)
Call from [facility] in the lab on Critial Result  Potassium  2.6

## 2016-09-14 NOTE — Telephone Encounter (Signed)
Made made aware of critical value at 845am. Order obtained for k-dur 40 meq tablets once a day to be sent to patient's pharmacy x 1 week's supply.  Rx sent to pharmacy k-dur 20 meq 1 tablet twice a day x 7 days to [facility].  Also received call from [name], RN x [phone] - hand off provided at 1000 am- made RN aware that patient will need to go to pharmacy to pick up rx for potassium. She will notify patient at discharge and ensure that this is also on pt's d/c after visit summary.

## 2016-09-14 NOTE — Discharge Instructions (Signed)
Lumbar Puncture, Care After °Refer to this sheet in the next few weeks. These instructions provide you with information on caring for yourself after your procedure. Your health care provider may also give you more specific instructions. Your treatment has been planned according to current medical practices, but problems sometimes occur. Call your health care provider if you have any problems or questions after your procedure. °What can I expect after the procedure? °After your procedure, it is typical to have the following sensations: °· Mild discomfort or pain at the insertion site. °· Mild headache that is relieved with pain medicines. ° °Follow these instructions at home: ° °· Avoid lifting anything heavier than 10 lb (4.5 kg) for at least 12 hours after the procedure. °· Drink enough fluids to keep your urine clear or pale yellow. °Contact a health care provider if: °· You have fever or chills. °· You have nausea or vomiting. °· You have a headache that lasts for more than 2 days. °Get help right away if: °· You have any numbness or tingling in your legs. °· You are unable to control your bowel or bladder. °· You have bleeding or swelling in your back at the insertion site. °· You are dizzy or faint. °This information is not intended to replace advice given to you by your health care provider. Make sure you discuss any questions you have with your health care provider. °Document Released: 08/11/2013 Document Revised: 01/12/2016 Document Reviewed: 04/14/2013 °Elsevier Interactive Patient Education © 2017 [employer] ° °

## 2016-09-14 NOTE — Progress Notes (Signed)
Patient ID: Rose [place], female   DOB: 02-Jan-1946, 71 y.o.   MRN: [id number] Pt stable after lp.d/c instructions given.f/u with her md.

## 2016-09-14 NOTE — OR Nursing (Signed)
Dr Ignatius Specking notified off potasium level 2.6, proceed with LP and give supplement post LP

## 2016-09-17 ENCOUNTER — Inpatient Hospital Stay: Payer: Medicare Other | Attending: Internal Medicine

## 2016-09-17 ENCOUNTER — Inpatient Hospital Stay (HOSPITAL_BASED_OUTPATIENT_CLINIC_OR_DEPARTMENT_OTHER): Payer: Medicare Other | Admitting: Internal Medicine

## 2016-09-17 ENCOUNTER — Inpatient Hospital Stay
Admission: AD | Admit: 2016-09-17 | Discharge: 2016-09-22 | DRG: 846 | Disposition: A | Discharge status: Home / Self Care | Payer: Medicare Other | Source: Ambulatory Visit | Attending: Internal Medicine | Admitting: Internal Medicine

## 2016-09-17 VITALS — BP 128/78 | HR 108 | Temp 98.1°F | Resp 98 | Wt 143.0 lb

## 2016-09-17 DIAGNOSIS — R591 Generalized enlarged lymph nodes: Secondary | ICD-10-CM

## 2016-09-17 DIAGNOSIS — R110 Nausea: Secondary | ICD-10-CM | POA: Diagnosis not present

## 2016-09-17 DIAGNOSIS — C8330 Diffuse large B-cell lymphoma, unspecified site: Secondary | ICD-10-CM

## 2016-09-17 DIAGNOSIS — E876 Hypokalemia: Secondary | ICD-10-CM

## 2016-09-17 DIAGNOSIS — C8333 Diffuse large B-cell lymphoma, intra-abdominal lymph nodes: Secondary | ICD-10-CM | POA: Diagnosis not present

## 2016-09-17 DIAGNOSIS — Z87891 Personal history of nicotine dependence: Secondary | ICD-10-CM

## 2016-09-17 DIAGNOSIS — Z8711 Personal history of peptic ulcer disease: Secondary | ICD-10-CM

## 2016-09-17 DIAGNOSIS — J849 Interstitial pulmonary disease, unspecified: Secondary | ICD-10-CM | POA: Diagnosis present

## 2016-09-17 DIAGNOSIS — E7800 Pure hypercholesterolemia, unspecified: Secondary | ICD-10-CM | POA: Diagnosis present

## 2016-09-17 DIAGNOSIS — Z6822 Body mass index (BMI) 22.0-22.9, adult: Secondary | ICD-10-CM | POA: Diagnosis not present

## 2016-09-17 DIAGNOSIS — R51 Headache: Secondary | ICD-10-CM | POA: Diagnosis not present

## 2016-09-17 DIAGNOSIS — Z5111 Encounter for antineoplastic chemotherapy: Secondary | ICD-10-CM | POA: Diagnosis not present

## 2016-09-17 DIAGNOSIS — Z79899 Other long term (current) drug therapy: Secondary | ICD-10-CM

## 2016-09-17 DIAGNOSIS — Z90710 Acquired absence of both cervix and uterus: Secondary | ICD-10-CM

## 2016-09-17 DIAGNOSIS — E119 Type 2 diabetes mellitus without complications: Secondary | ICD-10-CM

## 2016-09-17 DIAGNOSIS — R609 Edema, unspecified: Secondary | ICD-10-CM | POA: Diagnosis not present

## 2016-09-17 DIAGNOSIS — I10 Essential (primary) hypertension: Secondary | ICD-10-CM | POA: Diagnosis present

## 2016-09-17 DIAGNOSIS — G4733 Obstructive sleep apnea (adult) (pediatric): Secondary | ICD-10-CM | POA: Diagnosis present

## 2016-09-17 DIAGNOSIS — K5900 Constipation, unspecified: Secondary | ICD-10-CM | POA: Diagnosis not present

## 2016-09-17 DIAGNOSIS — C8513 Unspecified B-cell lymphoma, intra-abdominal lymph nodes: Secondary | ICD-10-CM | POA: Diagnosis present

## 2016-09-17 DIAGNOSIS — E43 Unspecified severe protein-calorie malnutrition: Secondary | ICD-10-CM | POA: Diagnosis present

## 2016-09-17 DIAGNOSIS — Z122 Encounter for screening for malignant neoplasm of respiratory organs: Secondary | ICD-10-CM | POA: Diagnosis not present

## 2016-09-17 DIAGNOSIS — R220 Localized swelling, mass and lump, head: Secondary | ICD-10-CM

## 2016-09-17 DIAGNOSIS — Z7901 Long term (current) use of anticoagulants: Secondary | ICD-10-CM | POA: Diagnosis not present

## 2016-09-17 LAB — CYTOLOGY - NON PAP

## 2016-09-17 LAB — BASIC METABOLIC PANEL
Anion gap: 9 (ref 5–15)
BUN: 8 mg/dL (ref 6–20)
CALCIUM: 9.3 mg/dL (ref 8.9–10.3)
CO2: 28 mmol/L (ref 22–32)
Chloride: 101 mmol/L (ref 101–111)
Creatinine, Ser: 0.61 mg/dL (ref 0.44–1.00)
Glucose, Bld: 147 mg/dL — ABNORMAL HIGH (ref 65–99)
Potassium: 4.1 mmol/L (ref 3.5–5.1)
Sodium: 138 mmol/L (ref 135–145)

## 2016-09-17 MED ORDER — B COMPLEX-C PO TABS
1.0000 | ORAL_TABLET | Freq: Every day | ORAL | Status: DC
  Administered 2016-09-20 – 2016-09-22 (×3): 1 via ORAL
  Filled 2016-09-17 (×5): qty 1

## 2016-09-17 MED ORDER — OXYCODONE HCL 5 MG PO TABS: 2.5000 mg | ORAL_TABLET | Freq: Four times a day (QID) | ORAL | Status: DC | PRN

## 2016-09-17 MED ORDER — ONDANSETRON HCL 4 MG PO TABS
4.0000 mg | ORAL_TABLET | Freq: Three times a day (TID) | ORAL | Status: DC | PRN
  Administered 2016-09-18: 4 mg via ORAL
  Filled 2016-09-17: qty 2

## 2016-09-17 MED ORDER — ACETAMINOPHEN 325 MG PO TABS
650.0000 mg | ORAL_TABLET | Freq: Once | ORAL | Status: AC
  Administered 2016-09-17: 17:00:00 650 mg via ORAL
  Filled 2016-09-17: qty 2

## 2016-09-17 MED ORDER — ALUM & MAG HYDROXIDE-SIMETH 200-200-20 MG/5ML PO SUSP: 60.0000 mL | ORAL | Status: DC | PRN

## 2016-09-17 MED ORDER — SODIUM CHLORIDE 0.9 % IV SOLN
INTRAVENOUS | Status: DC
  Administered 2016-09-21: 21:00:00 via INTRAVENOUS

## 2016-09-17 MED ORDER — ENOXAPARIN SODIUM 40 MG/0.4ML ~~LOC~~ SOLN
40.0000 mg | SUBCUTANEOUS | Status: DC
  Administered 2016-09-17 – 2016-09-21 (×5): 40 mg via SUBCUTANEOUS
  Filled 2016-09-17 (×5): qty 0.4

## 2016-09-17 MED ORDER — VINCRISTINE SULFATE CHEMO INJECTION 1 MG/ML
Freq: Once | INTRAVENOUS | Status: AC
  Administered 2016-09-17: 21:00:00 via INTRAVENOUS
  Filled 2016-09-17: qty 9

## 2016-09-17 MED ORDER — PRAVASTATIN SODIUM 20 MG PO TABS
40.0000 mg | ORAL_TABLET | Freq: Every day | ORAL | Status: DC
  Administered 2016-09-17 – 2016-09-21 (×5): 40 mg via ORAL
  Filled 2016-09-17 (×6): qty 2

## 2016-09-17 MED ORDER — SODIUM CHLORIDE 0.9 % IV SOLN
375.0000 mg/m2 | Freq: Once | INTRAVENOUS | Status: AC
  Administered 2016-09-17: 700 mg via INTRAVENOUS
  Filled 2016-09-17: qty 50

## 2016-09-17 MED ORDER — ACETAMINOPHEN 325 MG PO TABS: 650.0000 mg | ORAL_TABLET | ORAL | Status: DC | PRN

## 2016-09-17 MED ORDER — HYDROCORTISONE 2.5 % RE CREA
1.0000 "application " | TOPICAL_CREAM | Freq: Two times a day (BID) | RECTAL | Status: DC | PRN
  Filled 2016-09-17: qty 28.35

## 2016-09-17 MED ORDER — GUAIFENESIN-DM 100-10 MG/5ML PO SYRP: 10.0000 mL | ORAL_SOLUTION | ORAL | Status: DC | PRN

## 2016-09-17 MED ORDER — SENNOSIDES-DOCUSATE SODIUM 8.6-50 MG PO TABS: 1.0000 | ORAL_TABLET | Freq: Every evening | ORAL | Status: DC | PRN

## 2016-09-17 MED ORDER — LATANOPROST 0.005 % OP SOLN
1.0000 [drp] | Freq: Every day | OPHTHALMIC | Status: DC
  Administered 2016-09-17 – 2016-09-21 (×5): 1 [drp] via OPHTHALMIC
  Filled 2016-09-17: qty 2.5

## 2016-09-17 MED ORDER — ONDANSETRON 4 MG PO TBDP
4.0000 mg | ORAL_TABLET | Freq: Three times a day (TID) | ORAL | Status: DC | PRN
  Filled 2016-09-17: qty 2

## 2016-09-17 MED ORDER — ONDANSETRON HCL 40 MG/20ML IJ SOLN
Freq: Once | INTRAMUSCULAR | Status: AC
  Administered 2016-09-17: 8 mg via INTRAVENOUS
  Filled 2016-09-17: qty 4

## 2016-09-17 MED ORDER — ONDANSETRON HCL 4 MG/2ML IJ SOLN: 4.0000 mg | Freq: Three times a day (TID) | INTRAMUSCULAR | Status: DC | PRN

## 2016-09-17 MED ORDER — METFORMIN HCL 500 MG PO TABS
500.0000 mg | ORAL_TABLET | Freq: Every day | ORAL | Status: DC
  Administered 2016-09-18 – 2016-09-22 (×5): 500 mg via ORAL
  Filled 2016-09-17 (×5): qty 1

## 2016-09-17 MED ORDER — GABAPENTIN 100 MG PO CAPS
100.0000 mg | ORAL_CAPSULE | Freq: Three times a day (TID) | ORAL | Status: DC
  Administered 2016-09-17 – 2016-09-22 (×14): 100 mg via ORAL
  Filled 2016-09-17 (×14): qty 1

## 2016-09-17 MED ORDER — PANTOPRAZOLE SODIUM 40 MG PO TBEC
40.0000 mg | DELAYED_RELEASE_TABLET | Freq: Two times a day (BID) | ORAL | Status: DC
  Administered 2016-09-17 – 2016-09-22 (×10): 40 mg via ORAL
  Filled 2016-09-17 (×11): qty 1

## 2016-09-17 MED ORDER — DIPHENHYDRAMINE HCL 25 MG PO CAPS
50.0000 mg | ORAL_CAPSULE | Freq: Once | ORAL | Status: AC
  Administered 2016-09-17: 17:00:00 50 mg via ORAL
  Filled 2016-09-17: qty 2

## 2016-09-17 MED ORDER — HYDROCHLOROTHIAZIDE 12.5 MG PO CAPS
12.5000 mg | ORAL_CAPSULE | Freq: Every day | ORAL | Status: DC
  Administered 2016-09-18 – 2016-09-22 (×5): 12.5 mg via ORAL
  Filled 2016-09-17 (×5): qty 1

## 2016-09-17 MED ORDER — LOSARTAN POTASSIUM 50 MG PO TABS
100.0000 mg | ORAL_TABLET | Freq: Every day | ORAL | Status: DC
  Administered 2016-09-18 – 2016-09-22 (×5): 100 mg via ORAL
  Filled 2016-09-17 (×5): qty 2

## 2016-09-17 MED ORDER — PREDNISONE 50 MG PO TABS
100.0000 mg | ORAL_TABLET | Freq: Two times a day (BID) | ORAL | Status: AC
  Administered 2016-09-17 – 2016-09-22 (×10): 100 mg via ORAL
  Filled 2016-09-17 (×10): qty 2

## 2016-09-17 MED ORDER — SODIUM CHLORIDE 0.9 % IV SOLN
Freq: Once | INTRAVENOUS | Status: AC
  Administered 2016-09-17: 17:00:00 via INTRAVENOUS

## 2016-09-17 MED ORDER — SODIUM CHLORIDE 0.9 % IV SOLN
8.0000 mg | Freq: Three times a day (TID) | INTRAVENOUS | Status: DC | PRN
  Filled 2016-09-17: qty 4

## 2016-09-17 NOTE — Assessment & Plan Note (Addendum)
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 Patient tolerated chemotherapy with mild-to-moderate difficulties. Given the aggressive nature of the lymphoma the chemotherapy will be switched to dose adjusted  # Patient is to be admitted for cycle #1 of DA-EPOCH chemotherapy. Discussed the individual drugs and potential side effects again in detail.  # Mild headache likely post spinal headache- LP negative for malignancy. Cytology negative.  # Severe hypokalemia potassium 2.4. Status post supplementation. Improved.[place].  # DVT prophylaxis.  # Discussed with pharmacy patient will get onpro on day #5.

## 2016-09-17 NOTE — H&P (Signed)
[facility] CONSULT NOTE  Patient Care Team: Rusty Aus, [name]  CHIEF COMPLAINTS/PURPOSE OF CONSULTATION: Diffuse large B-cell lymphoma  HISTORY OF PRESENTING ILLNESS:  [place] 71 y.o.  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   Patient had lumbar puncture to rule out CNS 2 days ago. Complains of mild headache.   Patient was also noted to be hypokalemic potassium of 2.4 patient was given potassium outpatient.  Otherwise denies any fever or chills abdominal pain and diarrhea. Denies any constipation. The tingling or numbness.  ROS: A complete 10 point review of system is done which is negative except mentioned above in history of present illness  MEDICAL HISTORY:  Past Medical History:  Diagnosis Date  . Diabetes mellitus without complication ([place])   . History of gastric ulcer   . Hypercholesterolemia   . Hypertension   . ILD (interstitial lung disease) ([place])    8 yrs ago  . Lymphadenopathy     SURGICAL HISTORY: Past Surgical History:  Procedure Laterality Date  . BREAST BIOPSY Left 2010   neg  . HERNIA REPAIR    . PARTIAL HYSTERECTOMY     age 5  . PERIPHERAL VASCULAR CATHETERIZATION N/A 08/22/2016   Procedure: [name];  Surgeon: [name], MD;  Location: [facility];  Service: Cardiovascular;  Laterality: N/A;  . TONSILLECTOMY      SOCIAL HISTORY: Social History   Social History  . Marital status: Married    Spouse name: Selisa Pease  . Number of children: 2  . Years of education: N/A   Occupational History  . Retired    Social History Main Topics  . Smoking status: Former Smoker    Quit date: 08/21/1958  . Smokeless tobacco: Never Used  . Alcohol use No  . Drug use: No  . Sexual activity: No   Other Topics Concern  . Not on file   Social History Narrative   Not applicable at this time     FAMILY HISTORY: Family History  Problem Relation Age of Onset  . Heart disease Mother   . Heart disease Father   . Heart disease Brother     ALLERGIES:  is allergic to cefuroxime axetil and doxycycline.  MEDICATIONS:  No current facility-administered medications for this visit.    No current outpatient prescriptions on file.   Facility-Administered Medications Ordered in Other Visits  Medication Dose Route Frequency Provider Last Rate Last Dose  . 0.9 %  sodium chloride infusion   Intravenous Continuous Cammie Sickle, MD      . 0.9 %  sodium chloride infusion   Intravenous Once Cammie Sickle, MD      . acetaminophen (TYLENOL) tablet 650 mg  650 mg Oral Once Cammie Sickle, MD      . acetaminophen (TYLENOL) tablet 650 mg  650 mg Oral Q4H PRN Cammie Sickle, MD      . alum & mag hydroxide-simeth (MAALOX/MYLANTA) 200-200-20 MG/5ML suspension 60 mL  60 mL Oral Q4H PRN Cammie Sickle, MD      . diphenhydrAMINE (BENADRYL) capsule 50 mg  50 mg Oral Once Cammie Sickle, MD      . DOXOrubicin (ADRIAMYCIN) 18 mg, etoposide (VEPESID) 88 mg, vinCRIStine (ONCOVIN) 0.7 mg in sodium chloride 0.9 % 500 mL chemo infusion   Intravenous Once Cammie Sickle,  MD      . enoxaparin (LOVENOX) injection 40 mg  40 mg Subcutaneous Q24H Cammie Sickle, MD      . guaiFENesin-dextromethorphan (ROBITUSSIN DM) 100-10 MG/5ML syrup 10 mL  10 mL Oral Q4H PRN Cammie Sickle, MD      . hydrocortisone (ANUSOL-HC) 2.5 % rectal cream 1 application  1 application Rectal BID PRN Cammie Sickle, MD      . ondansetron (ZOFRAN) tablet 4-8 mg  4-8 mg Oral Q8H PRN Cammie Sickle, MD       Or  . ondansetron (ZOFRAN-ODT) disintegrating tablet 4-8 mg  4-8 mg Oral Q8H PRN Cammie Sickle, MD       Or  . ondansetron (ZOFRAN) injection 4 mg  4 mg Intravenous Q8H PRN Cammie Sickle, MD       Or  . ondansetron (ZOFRAN) 8 mg in sodium chloride 0.9 % 50 mL  IVPB  8 mg Intravenous Q8H PRN Cammie Sickle, MD      . ondansetron (ZOFRAN) 8 mg, dexamethasone (DECADRON) 10 mg in sodium chloride 0.9 % 50 mL IVPB   Intravenous Once Cammie Sickle, MD      . predniSONE (DELTASONE) tablet 100 mg  100 mg Oral BID WC Cammie Sickle, MD      . riTUXimab (RITUXAN) 700 mg in sodium chloride 0.9 % 250 mL (2.1875 mg/mL) chemo infusion  375 mg/m2 (Treatment Plan Recorded) Intravenous Once Cammie Sickle, MD      . senna-docusate (Senokot-S) tablet 1 tablet  1 tablet Oral QHS PRN Cammie Sickle, MD          .  PHYSICAL EXAMINATION:  Vitals:   09/17/16 0842  BP: 128/78  Pulse: (!) 108  Resp: (!) 98  Temp: 98.1 F (36.7 C)   Filed Weights   09/17/16 0842  Weight: 143 lb (64.9 kg)    GENERAL: Well-nourished well-developed; Alert, no distress and comfortable.   With her husband.  EYES: no pallor or icterus OROPHARYNX: no thrush or ulceration. NECK: supple, no masses felt LYMPH:  no palpable lymphadenopathy in the cervical, axillary or inguinal regions LUNGS: decreased breath sounds to auscultation at bases and  No wheeze or crackles HEART/CVS: regular rate & rhythm and no murmurs; No lower extremity edema ABDOMEN: abdomen soft, non-tender and normal bowel sounds Musculoskeletal:no cyanosis of digits and no clubbing  PSYCH: alert & oriented x 3 with fluent speech NEURO: no focal motor/sensory deficits SKIN:  no rashes or significant lesions  LABORATORY DATA:  I have reviewed the data as listed Lab Results  Component Value Date   WBC 4.7 09/14/2016   HGB 12.1 09/14/2016   HCT 34.9 (L) 09/14/2016   MCV 81.8 09/14/2016   PLT 302 09/14/2016    Recent Labs  08/24/16 0855 08/31/16 1022 09/14/16 0754 09/17/16 0819  NA 138 129* 138 138  K 3.2* 3.4* 2.6* 4.1  CL 103 93* 98* 101  CO2 25 26 31 28   GLUCOSE 151* 155* 139* 147*  BUN 9 12 6 8   CREATININE 0.66 0.63 0.43* 0.61  CALCIUM 9.0 9.0 9.2 9.3  GFRNONAA >60  >60 >60 >60  GFRAA >60 >60 >60 >60  PROT 7.2 7.4 7.0  --   ALBUMIN 4.2 4.1 3.6  --   AST 21 22 20   --   ALT 14 19 19   --   ALKPHOS 43 56 63  --   BILITOT 0.7 0.7 0.5  --  RADIOGRAPHIC STUDIES: I have personally reviewed the radiological images as listed and agreed with the findings in the report. Ct Biopsy  Result Date: 08/21/2016 INDICATION: Lymphoma. EXAM: CT BIOPSY MEDICATIONS: None. ANESTHESIA/SEDATION: Moderate (conscious) sedation was employed during this procedure. A total of Versed 2.5 mg and Fentanyl 100 mcg was administered intravenously. Moderate Sedation Time: 17 minutes. The patient's level of consciousness and vital signs were monitored continuously by radiology nursing throughout the procedure under my direct supervision. COMPLICATIONS: None immediate. PROCEDURE: Informed written consent was obtained from the patient after a thorough discussion of the procedural risks, benefits and alternatives. All questions were addressed. Maximal Sterile Barrier Technique was utilized including mask, sterile gowns, sterile gloves, sterile drape, hand hygiene and skin antiseptic. A timeout was performed prior to the initiation of the procedure. Patient was placed prone on the CT scanner. Right posterior flank region was prepped and draped using sterile technique. Local anesthetic was applied. Under CT guidance, 22 gauge spinal needle was directed toward posterior margin of right iliac bone. Lidocaine was administered subperiosteally. Needle was removed. Under CT guidance, trocar was directed through posterior margin of right iliac bone. Bone marrow aspirates with and without heparin were administered and given to pathology technologist who was present. Then, bone marrow core biopsy was obtained, and this sample was also given to pathology technologist. Needle was removed and appropriate dressing was applied. No immediate complications were noted. IMPRESSION: Under CT guidance, successful bone marrow  aspiration and core biopsy was performed of right iliac bone. Electronically Signed   By: Marijo Conception, M.D.   On: 08/21/2016 13:31   Dg Fluoro Guided Loc Of Needle/cath Tip For Spinal Inject Lt  Result Date: 09/14/2016 CLINICAL DATA:  Lymphoma. EXAM: DIAGNOSTIC LUMBAR PUNCTURE UNDER FLUOROSCOPIC GUIDANCE FLUOROSCOPY TIME:  Fluoroscopy Time:  2 minutes 30 seconds. Radiation Exposure Index (if provided by the fluoroscopic device): 65.2 mGy Number of Acquired Spot Images: 1 PROCEDURE: After discussing this procedure with the patient informed consent was obtained . Patient was sterilely prepped and draped. 1% lidocaine used for local anesthesia . 22 gauge spinal needle was advanced into the spinal canal at the level of L3-L4. Opening pressure of approximately 9 cm noted. Clear CSF was obtained. A total of 10 cc obtained. IMPRESSION: Successful fluoroscopically directed lumbar puncture. Electronically Signed   By: Marcello Moores  Register   On: 09/14/2016 10:37    ASSESSMENT & PLAN:  Diffuse large B-cell lymphoma of intra-abdominal lymph nodes ([place]) 71 year old female patient with history of triple hit diffuse large B-cell lymphoma status post cycle #1 of CHOP chemotherapy. Patient tolerated chemotherapy with mild-to-moderate difficulties. Given the aggressive nature of the lymphoma the chemotherapy will be switched to dose adjusted  # Patient is to be admitted for cycle #1 of DA-EPOCH chemotherapy. Discussed the individual drugs and potential side effects again in detail.  # Mild headache likely post spinal headache- LP negative for malignancy. Cytology negative.  # Severe hypokalemia potassium 2.4. Status post supplementation. Improved.Vonzell Schlatter.  # DVT prophylaxis.  # Discussed with pharmacy patient will get onpro on day #5.     Cammie Sickle, MD 09/17/2016 3:40 PM

## 2016-09-17 NOTE — Progress Notes (Signed)
Patient here today for follow up.  Patient c/o headache this morning.

## 2016-09-17 NOTE — Plan of Care (Signed)
Problem: Education: Goal: Knowledge of [facility] Education information/materials will improve Outcome: Progressing Education done on chemotherapy.

## 2016-09-17 NOTE — Progress Notes (Signed)
Pt admitted to hospital for chemo today.

## 2016-09-17 NOTE — Progress Notes (Signed)
Patient took her her home medication oxycodone 2.5 mg today at 909 am for a "spinal headache" ("pressure on back side of head"). Pt Rated pain 5-6/10.  Patient had a lumbar puncture on 09/14/16.

## 2016-09-17 NOTE — Progress Notes (Signed)
Patient states that her headache subsided. Now Rates pain 2 at present.   Call report to Wiliam Ke, RN at [phone]. Hand off provided. Patient being admitted for chemotherapy infusion on 1C.   Patient made aware that her bed assignment is ready. She states that she would like to walk to unit. Her husband would accompany her to the inpatient unit.

## 2016-09-18 ENCOUNTER — Other Ambulatory Visit: Payer: Self-pay | Admitting: Internal Medicine

## 2016-09-18 DIAGNOSIS — Z7901 Long term (current) use of anticoagulants: Secondary | ICD-10-CM

## 2016-09-18 DIAGNOSIS — Z7952 Long term (current) use of systemic steroids: Secondary | ICD-10-CM

## 2016-09-18 DIAGNOSIS — E43 Unspecified severe protein-calorie malnutrition: Secondary | ICD-10-CM | POA: Insufficient documentation

## 2016-09-18 DIAGNOSIS — Z8709 Personal history of other diseases of the respiratory system: Secondary | ICD-10-CM

## 2016-09-18 LAB — BASIC METABOLIC PANEL
Anion gap: 8 (ref 5–15)
BUN: 9 mg/dL (ref 6–20)
CO2: 25 mmol/L (ref 22–32)
Calcium: 9.4 mg/dL (ref 8.9–10.3)
Chloride: 106 mmol/L (ref 101–111)
Creatinine, Ser: 0.7 mg/dL (ref 0.44–1.00)
Glucose, Bld: 210 mg/dL — ABNORMAL HIGH (ref 65–99)
Potassium: 4 mmol/L (ref 3.5–5.1)
Sodium: 139 mmol/L (ref 135–145)

## 2016-09-18 MED ORDER — SODIUM CHLORIDE 0.9 % IV SOLN
Freq: Once | INTRAVENOUS | Status: DC
  Filled 2016-09-18: qty 4

## 2016-09-18 MED ORDER — VINCRISTINE SULFATE CHEMO INJECTION 1 MG/ML
Freq: Once | INTRAVENOUS | Status: AC
  Administered 2016-09-18: 20:00:00 via INTRAVENOUS
  Filled 2016-09-18: qty 9

## 2016-09-18 MED ORDER — ENSURE ENLIVE PO LIQD
237.0000 mL | Freq: Two times a day (BID) | ORAL | Status: DC
  Administered 2016-09-18 – 2016-09-21 (×6): 237 mL via ORAL

## 2016-09-18 NOTE — Progress Notes (Signed)
Initial Nutrition Assessment  DOCUMENTATION CODES:   Severe malnutrition in context of acute illness/injury  INTERVENTION:  Provide Ensure Enlive po BID between meals, each supplement provides 350 kcal and 20 grams of protein.  Reviewed and discussed "Making the Most of Each Bite" handout from the [employer] with patient and husband. Encouraged small, frequent meals and use of oral nutrition supplements at home twice daily between meals. Discussed importance of adequate calories and protein to prevent further weight loss and maintain lean body mass.   NUTRITION DIAGNOSIS:   Inadequate oral intake related to poor appetite, cancer and cancer related treatments as evidenced by per patient/family report, 11.4 percent weight loss over 1 month.  GOAL:   Patient will meet greater than or equal to 90% of their needs  MONITOR:   PO intake, Supplement acceptance, Labs, Weight trends  REASON FOR ASSESSMENT:   Malnutrition Screening Tool    ASSESSMENT:   71 year old female with PMHx of interstitial lung disease, DM type 2, HTN, triple hit diffuse large B-cell lymphoma s/p cycle #1 of CHOP chemotherapy on 1/5. Chemotherapy has been switched to [facility] and patient has been admitted for cycle #1.    Spoke with patient and husband at bedside. Patient reports her appetite has been poor since she started treatment at the beginning of the month, but it is slowly starting to improve. She has been eating less than normal for the past month, but she reports her intake varies day to day. For breakfast this morning patient had 2 eggs, 2 slices of bacon, and orange juice, which she reports is a typical breakfast for her. She also reports having lunch daily, but has trouble with dinner, which she reports is usually a very small meals. Patient has Ensure at home but she has not started to drink them yet because someone told her it would give her diarrhea. Patient endorses some nausea  earlier this month but none now, and occasional abdominal pain. Denies constipation/diarrhea or difficulty chewing/swallowing. Patient is amenable to drinking Ensure twice daily between meals.  UBW was 157 lbs. Per chart patient has lost 18 lbs (11.4% body weight) over 1 month, which is significant for time frame.   Meal Completion: 60% of breakfast this morning per chart  Medications reviewed and include: B-complex with vitamin C once daily, metformin 500 mg daily with breakfast, pantoprazole, prednisone 100 mg BID with meals.   Labs reviewed: CBG 136. No HgbA1c in chart.  Nutrition-Focused physical exam completed. Findings are mild-moderate fat depletion, moderate-severe muscle depletion, and no edema.   Patient meets criteria for severe acute malnutrition in setting of 11.4% weight loss over 1 month, moderate fat depletion, moderate-severe muscle depletion.   Discussed with RN.   Diet Order:  Diet regular Room service appropriate? Yes; Fluid consistency: Thin  Skin:  Reviewed, no issues  Last BM:  Unknown  Height:   Ht Readings from Last 1 Encounters:  09/17/16 5\' 7"  (1.702 m)    Weight:   Wt Readings from Last 1 Encounters:  09/17/16 140 lb 8 oz (63.7 kg)    Ideal Body Weight:  61.4 kg  BMI:  Body mass index is 22.01 kg/m.  Estimated Nutritional Needs:   Kcal:  1600-1800 (MSJ x 1.3-1.5)  Protein:  76-95 grams (1.2-1.5 grams/kg)  Fluid:  1.6 L/day (25 ml/kg)  EDUCATION NEEDS:   Education needs addressed  Willey Blade, MS, RD, LDN Pager: [phone] After Hours Pager: [phone]

## 2016-09-18 NOTE — Progress Notes (Signed)
Hematology/Oncology Consult note [facility]  [phone] [phone]     Patient Care Team: Rusty Aus, [name]   Name of the patient: Rose Hill  [id number]  11-06-1945   Date of visit: 09/18/2016   Interval history- patient is tolerating her chemotherapy well so far without any significant side efefcts. Reports no nausea, vomiting this afternoon. She is moving her bowesls  ECOG PS- 1  Review of Systems  Constitutional: Positive for malaise/fatigue. Negative for chills, fever and weight loss.  HENT: Negative for congestion, ear discharge and nosebleeds.   Eyes: Negative for blurred vision.  Respiratory: Negative for cough, hemoptysis, sputum production, shortness of breath and wheezing.   Cardiovascular: Negative for chest pain, palpitations, orthopnea and claudication.  Gastrointestinal: Negative for abdominal pain, blood in stool, constipation, diarrhea, heartburn, melena, nausea and vomiting.  Genitourinary: Negative for dysuria, flank pain, frequency, hematuria and urgency.  Musculoskeletal: Negative for back pain, joint pain and myalgias.  Skin: Negative for rash.  Neurological: Negative for dizziness, tingling, focal weakness, seizures, weakness and headaches.  Endo/Heme/Allergies: Does not bruise/bleed easily.  Psychiatric/Behavioral: Negative for depression and suicidal ideas. The patient does not have insomnia.     Allergies  Allergen Reactions  . Cefuroxime Axetil Diarrhea    Other reaction(s): Abdominal Pain  . Doxycycline     Other reaction(s): Abdominal Pain severe    Patient Active Problem List   Diagnosis Date Noted  . Protein-calorie malnutrition, severe 09/18/2016  . 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 Diffuse large B-cell lymphoma ([place]) 08/17/2016  . Abdominal mass 07/31/2016  . Retroperitoneal lymphadenopathy 07/31/2016  .  DM 10/20/2008  . HYPERLIPIDEMIA 10/20/2008  . OBSTRUCTIVE SLEEP APNEA 10/20/2008  . HYPERTENSION 10/20/2008  . ALLERGIC RHINITIS 10/20/2008  . 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 Diabetes mellitus without complication ([place])   . History of gastric ulcer   . Hypercholesterolemia   . Hypertension   . ILD (interstitial lung disease) ([place])    8 yrs ago  . Lymphadenopathy      Past Surgical History:  Procedure Laterality Date  . BREAST BIOPSY Left 2010   neg  . HERNIA REPAIR    . PARTIAL HYSTERECTOMY     age 79  . PERIPHERAL VASCULAR CATHETERIZATION N/A 08/22/2016   Procedure: [name];  Surgeon: [name], MD;  Location: [place] [facility];  Service: Cardiovascular;  Laterality: N/A;  . TONSILLECTOMY      Social History   Social History  . Marital status: Married    Spouse name: Vian Denaro  . Number of children: 2  . Years of education: N/A   Occupational History  . Retired    Social History Main Topics  . Smoking status: Former Smoker    Quit date: 08/21/1958  . Smokeless tobacco: Never Used  . Alcohol use No  . Drug use: No  . Sexual activity: No   Other Topics Concern  . Not on file   Social History Narrative   Not applicable at this time    Family History  Problem Relation Age of Onset  . Heart disease Mother   . Heart disease Father   . Heart disease Brother      Current Facility-Administered Medications:  .  0.9 %  sodium chloride infusion, , Intravenous, Continuous, Cammie Sickle, MD .  acetaminophen (TYLENOL) tablet  650 mg, 650 mg, Oral, Q4H PRN, Cammie Sickle, MD .  alum & mag hydroxide-simeth (MAALOX/MYLANTA) 200-200-20 MG/5ML suspension 60 mL, 60 mL, Oral, Q4H PRN, Cammie Sickle, MD .  B-complex with vitamin C tablet 1 tablet, 1 tablet, Oral, Daily, Cammie Sickle, MD .  DOXOrubicin (ADRIAMYCIN) 18 mg, etoposide (VEPESID) 88 mg, vinCRIStine (ONCOVIN) 0.7  mg in sodium chloride 0.9 % 500 mL chemo infusion, , Intravenous, Once, Cammie Sickle, MD, Last Rate: 23 mL/hr at 09/17/16 2117 .  DOXOrubicin (ADRIAMYCIN) 18 mg, etoposide (VEPESID) 88 mg, vinCRIStine (ONCOVIN) 0.7 mg in sodium chloride 0.9 % 500 mL chemo infusion, , Intravenous, Once, Cammie Sickle, MD .  enoxaparin (LOVENOX) injection 40 mg, 40 mg, Subcutaneous, Q24H, Cammie Sickle, MD, 40 mg at 09/17/16 2151 .  feeding supplement (ENSURE ENLIVE) (ENSURE ENLIVE) liquid 237 mL, 237 mL, Oral, BID BM, Cammie Sickle, MD, 237 mL at 09/18/16 1454 .  gabapentin (NEURONTIN) capsule 100 mg, 100 mg, Oral, TID, Cammie Sickle, MD, 100 mg at 09/18/16 1128 .  guaiFENesin-dextromethorphan (ROBITUSSIN DM) 100-10 MG/5ML syrup 10 mL, 10 mL, Oral, Q4H PRN, Cammie Sickle, MD .  hydrochlorothiazide (MICROZIDE) capsule 12.5 mg, 12.5 mg, Oral, Daily, Cammie Sickle, MD, 12.5 mg at 09/18/16 1128 .  hydrocortisone (ANUSOL-HC) 2.5 % rectal cream 1 application, 1 application, Rectal, BID PRN, Cammie Sickle, MD .  latanoprost (XALATAN) 0.005 % ophthalmic solution 1 drop, 1 drop, Both Eyes, QHS, Cammie Sickle, MD, 1 drop at 09/17/16 2151 .  losartan (COZAAR) tablet 100 mg, 100 mg, Oral, Daily, Cammie Sickle, MD, 100 mg at 09/18/16 1128 .  metFORMIN (GLUCOPHAGE) tablet 500 mg, 500 mg, Oral, Q breakfast, Cammie Sickle, MD, 500 mg at 09/18/16 1128 .  ondansetron (ZOFRAN) tablet 4-8 mg, 4-8 mg, Oral, Q8H PRN **OR** ondansetron (ZOFRAN-ODT) disintegrating tablet 4-8 mg, 4-8 mg, Oral, Q8H PRN **OR** ondansetron (ZOFRAN) injection 4 mg, 4 mg, Intravenous, Q8H PRN **OR** ondansetron (ZOFRAN) 8 mg in sodium chloride 0.9 % 50 mL IVPB, 8 mg, Intravenous, Q8H PRN, Cammie Sickle, MD .  ondansetron (ZOFRAN) 8 mg, dexamethasone (DECADRON) 10 mg in sodium chloride 0.9 % 50 mL IVPB, , Intravenous, Once, Cammie Sickle, MD .  oxyCODONE (Oxy IR/ROXICODONE)  immediate release tablet 2.5 mg, 2.5 mg, Oral, Q6H PRN, Cammie Sickle, MD .  pantoprazole (PROTONIX) EC tablet 40 mg, 40 mg, Oral, BID, Cammie Sickle, MD, 40 mg at 09/18/16 1128 .  pravastatin (PRAVACHOL) tablet 40 mg, 40 mg, Oral, Daily, Cammie Sickle, MD, 40 mg at 09/18/16 1127 .  predniSONE (DELTASONE) tablet 100 mg, 100 mg, Oral, BID WC, Cammie Sickle, MD, 100 mg at 09/18/16 1128 .  senna-docusate (Senokot-S) tablet 1 tablet, 1 tablet, Oral, QHS PRN, Cammie Sickle, MD  BP (!) 121/47   Pulse 90   Temp 98 F (36.7 C) (Oral)   Resp 18   Ht 5\' 7"  (1.702 m)   Wt 140 lb 8 oz (63.7 kg)   SpO2 99%   BMI 22.01 kg/m    Physical exam:  Gen- AAO X3, no acute distress HEENT- EOMI, PERL, no icetrus CVS- S1 S2 normal, RRR, no m/r/g RS- CTA B/L Abdomen- soft NT Ext- no edema  CMP Latest Ref Rng & Units 09/17/2016  Glucose 65 - 99 mg/dL 147(H)  BUN 6 - 20 mg/dL 8  Creatinine 0.44 - 1.00 mg/dL 0.61  Sodium 135 - 145 mmol/L 138  Potassium 3.5 - 5.1 mmol/L 4.1  Chloride 101 - 111 mmol/L 101  CO2 22 - 32 mmol/L 28  Calcium 8.9 - 10.3 mg/dL 9.3  Total Protein 6.5 - 8.1 g/dL -  Total Bilirubin 0.3 - 1.2 mg/dL -  Alkaline Phos 38 - 126 U/L -  AST 15 - 41 U/L -  ALT 14 - 54 U/L -   CBC Latest Ref Rng & Units 09/14/2016  WBC 3.6 - 11.0 K/uL 4.7  Hemoglobin 12.0 - 16.0 g/dL 12.1  Hematocrit 35.0 - 47.0 % 34.9(L)  Platelets 150 - 440 K/uL 302    Ct Biopsy  Result Date: 08/21/2016 INDICATION: Lymphoma. EXAM: CT BIOPSY MEDICATIONS: None. ANESTHESIA/SEDATION: Moderate (conscious) sedation was employed during this procedure. A total of Versed 2.5 mg and Fentanyl 100 mcg was administered intravenously. Moderate Sedation Time: 17 minutes. The patient's level of consciousness and vital signs were monitored continuously by radiology nursing throughout the procedure under my direct supervision. COMPLICATIONS: None immediate. PROCEDURE: Informed written consent was  obtained from the patient after a thorough discussion of the procedural risks, benefits and alternatives. All questions were addressed. Maximal Sterile Barrier Technique was utilized including mask, sterile gowns, sterile gloves, sterile drape, hand hygiene and skin antiseptic. A timeout was performed prior to the initiation of the procedure. Patient was placed prone on the CT scanner. Right posterior flank region was prepped and draped using sterile technique. Local anesthetic was applied. Under CT guidance, 22 gauge spinal needle was directed toward posterior margin of right iliac bone. Lidocaine was administered subperiosteally. Needle was removed. Under CT guidance, trocar was directed through posterior margin of right iliac bone. Bone marrow aspirates with and without heparin were administered and given to pathology technologist who was present. Then, bone marrow core biopsy was obtained, and this sample was also given to pathology technologist. Needle was removed and appropriate dressing was applied. No immediate complications were noted. IMPRESSION: Under CT guidance, successful bone marrow aspiration and core biopsy was performed of right iliac bone. Electronically Signed   By: Marijo Conception, M.D.   On: 08/21/2016 13:31   Dg Fluoro Guided Loc Of Needle/cath Tip For Spinal Inject Lt  Result Date: 09/14/2016 CLINICAL DATA:  Lymphoma. EXAM: DIAGNOSTIC LUMBAR PUNCTURE UNDER FLUOROSCOPIC GUIDANCE FLUOROSCOPY TIME:  Fluoroscopy Time:  2 minutes 30 seconds. Radiation Exposure Index (if provided by the fluoroscopic device): 65.2 mGy Number of Acquired Spot Images: 1 PROCEDURE: After discussing this procedure with the patient informed consent was obtained . Patient was sterilely prepped and draped. 1% lidocaine used for local anesthesia . 22 gauge spinal needle was advanced into the spinal canal at the level of L3-L4. Opening pressure of approximately 9 cm noted. Clear CSF was obtained. A total of 10 cc  obtained. IMPRESSION: Successful fluoroscopically directed lumbar puncture. Electronically Signed   By: Marcello Moores  Register   On: 09/14/2016 10:37     Assessment and plan- Patient is a 71 y.o. 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 She did receive RCHOP 1 cycle as an outpatient  1. Triple hit DLBCL- Day 2 of chemotherapy today. tolerating well so far. Will order routine labs in am. Will finish chemo in 2 days  2. Severe protein calorie malnutrition- secondary to malignancy- appreciate dietician input. Continue ensure  3. Continue DVT prophylaxis  4. Metformin for DM  5. Prn oxycodone for neoplasm related pain  6. Losartan for HTN and pravastatin for hyperlipidemia  Dr. Rogue Bussing will see patient tomorrow  Dr. Randa Evens, MD, MPH [facility] Pager- [phone] 09/18/2016 3:44 PM

## 2016-09-19 DIAGNOSIS — C8333 Diffuse large B-cell lymphoma, intra-abdominal lymph nodes: Secondary | ICD-10-CM

## 2016-09-19 MED ORDER — DOCUSATE SODIUM 100 MG PO CAPS
100.0000 mg | ORAL_CAPSULE | Freq: Every day | ORAL | Status: DC
  Administered 2016-09-19 – 2016-09-22 (×4): 100 mg via ORAL
  Filled 2016-09-19 (×4): qty 1

## 2016-09-19 MED ORDER — VINCRISTINE SULFATE CHEMO INJECTION 1 MG/ML
Freq: Once | INTRAVENOUS | Status: AC
  Administered 2016-09-19: 20:00:00 via INTRAVENOUS
  Filled 2016-09-19: qty 9

## 2016-09-19 MED ORDER — ONDANSETRON HCL 40 MG/20ML IJ SOLN
Freq: Once | INTRAMUSCULAR | Status: AC
  Administered 2016-09-19: 19:00:00 8 mg via INTRAVENOUS
  Filled 2016-09-19: qty 4

## 2016-09-19 NOTE — Care Management Important Message (Signed)
Important Message  Patient Details  Name: Rose Hill MRN: [id number] Date of Birth: 05-02-1946   Medicare Important Message Given:  Yes    Shelbie Ammons, RN 09/19/2016, 10:22 AM

## 2016-09-19 NOTE — Care Management (Signed)
Admitted to [facility] with the new diagnosis of large B cell lymphoma of intra abdominal lymph nodes. Lives with husband, Edd Arbour [phone] x [phone] years. Retired from [employer]. Last seen Dr. Emily Filbert 08/03/17. No home health, skilled nursing, or home oxygen. No equipment in the home. Takes care of all basic and instrumental activities of daily living herself, drives, No falls. Decreased appetite. Prescriptions are filled at [facility] on [address], Receiving chemotherapy during this admission. Projected discharge is this Saturday. Husband will transport.            Shelbie Ammons RN MSN CCM Care Management

## 2016-09-19 NOTE — Progress Notes (Signed)
Rose Hill   DOB:1945-10-14   [id number]    CC: Patient admitted to the hospital for dose adjusted-REPOCH. Today's day #3.  Denies any constipation. Denies any tingling or numbness. No nausea no vomiting. Appetite is good. No fevers or chills.   ROS: no head aches. No constipation.  Objective:  Vitals:   09/19/16 0359 09/19/16 0854  BP: (!) 118/53 (!) 104/58  Pulse: 64 72  Resp: 18   Temp: 97.9 F (36.6 C)      Intake/Output Summary (Last 24 hours) at 09/19/16 0912 Last data filed at 09/18/16 1700  Gross per 24 hour  Intake           488.02 ml  Output                0 ml  Net           488.02 ml    GENERAL Alert, no distress and comfortable. EYES: no pallor or icterus OROPHARYNX: no thrush or ulceration. NECK: supple, no masses felt LYMPH:  no palpable lymphadenopathy in the cervical, axillary or inguinal regions LUNGS: decreased breath sounds to auscultation at bases and  No wheeze or crackles HEART/CVS: regular rate & rhythm and no murmurs; No lower extremity edema ABDOMEN: abdomen soft, tender  on deep palpation. and normal bowel sounds Musculoskeletal:no cyanosis of digits and no clubbing  PSYCH: alert & oriented x 3 with fluent speech NEURO: no focal motor/sensory deficits SKIN:  no rashes or significant lesions   Labs:  Lab Results  Component Value Date   WBC 4.7 09/14/2016   HGB 12.1 09/14/2016   HCT 34.9 (L) 09/14/2016   MCV 81.8 09/14/2016   PLT 302 09/14/2016   NEUTROABS 2.9 09/14/2016    Lab Results  Component Value Date   NA 139 09/18/2016   K 4.0 09/18/2016   CL 106 09/18/2016   CO2 25 09/18/2016    Studies:  No results found.  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  # today day #3 of chemo. Plan cytoxan on day # 5/ Saturday.  # DVT prophylaxis.  # HNT- continue  home medications.       Cammie Sickle, MD 09/19/2016  9:12 AM

## 2016-09-19 NOTE — Progress Notes (Signed)
Inpatient Diabetes Program Recommendations  AACE/ADA: New Consensus Statement on Inpatient Glycemic Control (2015)  Target Ranges:  Prepandial:   less than 140 mg/dL      Peak postprandial:   less than 180 mg/dL (1-2 hours)      Critically ill patients:  140 - 180 mg/dL   Results for Rose Hill, Rose Hill (MRN [id number]) as of 09/19/2016 13:08  Ref. Range 09/17/2016 08:19 09/18/2016 15:39  Glucose Latest Ref Range: 65 - 99 mg/dL 147 (H) 210 (H)   Review of Glycemic Control  Diabetes history: DM2 Outpatient Diabetes medications: Metformin 500 mg QAM Current orders for Inpatient glycemic control: Metformin 500 mg QAM  Inpatient Diabetes Program Recommendations: Correction (SSI): While inpatient, please consider using Glycemic Control order set and ordering CBGs with Novolog correction scale ACHS.  Thanks, Barnie Alderman, RN, MSN, CDE Diabetes Coordinator Inpatient Diabetes Program [phone] (Team Pager from 8am to 5pm)

## 2016-09-19 NOTE — Assessment & Plan Note (Addendum)
71 year old female patient with history of triple hit diffuse large B-cell lymphoma status currently on DA- [place] cycle #1.  # today day #6 of chemo. Finished cytoxan last night; also received onpro last night. Discussed the pros and cons of oncpro.  # Facial swelling- likely fluid retention from steroids vs others.    # Mild nausea continue when necessary Zofran.  # Constipation-improved. stool softener as needed  # DVT prophylaxis.  # HNT- continue home medications.    # plan DC home today.

## 2016-09-20 DIAGNOSIS — R110 Nausea: Secondary | ICD-10-CM

## 2016-09-20 DIAGNOSIS — K5900 Constipation, unspecified: Secondary | ICD-10-CM

## 2016-09-20 LAB — COMPREHENSIVE METABOLIC PANEL
ALBUMIN: 3.4 g/dL — AB (ref 3.5–5.0)
ALT: 14 U/L (ref 14–54)
AST: 14 U/L — AB (ref 15–41)
Alkaline Phosphatase: 41 U/L (ref 38–126)
Anion gap: 7 (ref 5–15)
BUN: 18 mg/dL (ref 6–20)
CHLORIDE: 104 mmol/L (ref 101–111)
CO2: 28 mmol/L (ref 22–32)
CREATININE: 0.46 mg/dL (ref 0.44–1.00)
Calcium: 9.1 mg/dL (ref 8.9–10.3)
GFR calc Af Amer: >60 mL/min (ref >60)
GFR calc non Af Amer: >60 mL/min (ref >60)
GLUCOSE: 136 mg/dL — AB (ref 65–99)
POTASSIUM: 3.6 mmol/L (ref 3.5–5.1)
SODIUM: 139 mmol/L (ref 135–145)
Total Bilirubin: 0.6 mg/dL (ref 0.3–1.2)
Total Protein: 6.2 g/dL — ABNORMAL LOW (ref 6.5–8.1)

## 2016-09-20 LAB — CBC WITH DIFFERENTIAL/PLATELET
BASOS ABS: 0 10*3/uL (ref 0–0.1)
BASOS PCT: 0 %
EOS ABS: 0 10*3/uL (ref 0–0.7)
EOS PCT: 0 %
HCT: 34 % — ABNORMAL LOW (ref 35.0–47.0)
Hemoglobin: 11.7 g/dL — ABNORMAL LOW (ref 12.0–16.0)
LYMPHS PCT: 8 %
Lymphs Abs: 0.4 10*3/uL — ABNORMAL LOW (ref 1.0–3.6)
MCH: 28.7 pg (ref 26.0–34.0)
MCHC: 34.6 g/dL (ref 32.0–36.0)
MCV: 83 fL (ref 80.0–100.0)
MONO ABS: 0.5 10*3/uL (ref 0.2–0.9)
Monocytes Relative: 11 %
Neutro Abs: 3.9 10*3/uL (ref 1.4–6.5)
Neutrophils Relative %: 81 %
PLATELETS: 251 10*3/uL (ref 150–440)
RBC: 4.09 MIL/uL (ref 3.80–5.20)
RDW: 15.9 % — AB (ref 11.5–14.5)
WBC: 4.8 10*3/uL (ref 3.6–11.0)

## 2016-09-20 MED ORDER — SODIUM CHLORIDE 0.9 % IV SOLN
Freq: Once | INTRAVENOUS | Status: AC
  Administered 2016-09-20: 10 mg via INTRAVENOUS
  Filled 2016-09-20: qty 4

## 2016-09-20 MED ORDER — VINCRISTINE SULFATE CHEMO INJECTION 1 MG/ML
Freq: Once | INTRAVENOUS | Status: AC
  Administered 2016-09-20: 21:00:00 via INTRAVENOUS
  Filled 2016-09-20: qty 9

## 2016-09-20 NOTE — Progress Notes (Signed)
Rose Hill   DOB:03-Mar-1946   [id number]    CC: Patient admitted to the hospital for dose adjusted-REPOCH. Today's day #4.  Mild nausea for mild constipation. No vomiting. No abdominal cramps.  Denies any tingling or numbness. Appetite is good. No fevers or chills.   ROS: no head aches.   Objective:  Vitals:   09/19/16 1934 09/20/16 0504  BP: (!) 126/57 (!) 119/55  Pulse: 75 63  Resp:  20  Temp: 97.8 F (36.6 C) 97.6 F (36.4 C)     Intake/Output Summary (Last 24 hours) at 09/20/16 [id number] Last data filed at 09/19/16 1920  Gross per 24 hour  Intake          1429.27 ml  Output                0 ml  Net          1429.27 ml    GENERAL Alert, no distress and comfortable.Accompanied by her husband. EYES: no pallor or icterus OROPHARYNX: no thrush or ulceration. NECK: supple, no masses felt LYMPH:  no palpable lymphadenopathy in the cervical, axillary or inguinal regions LUNGS: decreased breath sounds to auscultation at bases and  No wheeze or crackles HEART/CVS: regular rate & rhythm and no murmurs; No lower extremity edema ABDOMEN: abdomen soft, tender  on deep palpation. and normal bowel sounds Musculoskeletal:no cyanosis of digits and no clubbing  PSYCH: alert & oriented x 3 with fluent speech NEURO: no focal motor/sensory deficits SKIN:  no rashes or significant lesions   Labs:  Lab Results  Component Value Date   WBC 4.7 09/14/2016   HGB 12.1 09/14/2016   HCT 34.9 (L) 09/14/2016   MCV 81.8 09/14/2016   PLT 302 09/14/2016   NEUTROABS 2.9 09/14/2016    Lab Results  Component Value Date   NA 139 09/18/2016   K 4.0 09/18/2016   CL 106 09/18/2016   CO2 25 09/18/2016    Studies:  No results found.  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  # today day #4 of chemo. Plan cytoxan on day # 5/  late Friday night [2/2]  # Mild nausea continue when necessary Zofran.  # Constipation- stool softener as needed  # DVT prophylaxis.  # HNT- continue home medications.       Cammie Sickle, MD 09/20/2016  8:08 AM

## 2016-09-21 ENCOUNTER — Other Ambulatory Visit: Payer: Self-pay | Admitting: Internal Medicine

## 2016-09-21 DIAGNOSIS — R609 Edema, unspecified: Secondary | ICD-10-CM

## 2016-09-21 MED ORDER — DIPHENHYDRAMINE HCL 25 MG PO CAPS
25.0000 mg | ORAL_CAPSULE | Freq: Three times a day (TID) | ORAL | Status: DC | PRN
  Administered 2016-09-21: 25 mg via ORAL
  Filled 2016-09-21: qty 1

## 2016-09-21 MED ORDER — SODIUM CHLORIDE 0.9 % IV SOLN
Freq: Once | INTRAVENOUS | Status: AC
  Administered 2016-09-21: 16 mg via INTRAVENOUS
  Filled 2016-09-21: qty 8

## 2016-09-21 MED ORDER — PEGFILGRASTIM 6 MG/0.6ML ~~LOC~~ PSKT
6.0000 mg | PREFILLED_SYRINGE | Freq: Once | SUBCUTANEOUS | Status: AC
  Administered 2016-09-21: 23:00:00 6 mg via SUBCUTANEOUS
  Filled 2016-09-21 (×2): qty 0.6

## 2016-09-21 MED ORDER — SODIUM CHLORIDE 0.9 % IV SOLN
1300.0000 mg | Freq: Once | INTRAVENOUS | Status: AC
  Administered 2016-09-21: 20:00:00 1300 mg via INTRAVENOUS
  Filled 2016-09-21: qty 65

## 2016-09-21 NOTE — Progress Notes (Signed)
SHANISE KLAUER   DOB:[birth year]   [id number]    CC: Patient admitted to the hospital for dose adjusted-REPOCH. Today's day #5   C/o of mild facial swelling/ "tightness". No itch.  No vomiting. No abdominal cramps.  Denies any tingling or numbness. Appetite is good. No fevers or chills.   ROS: no head aches.   Objective:  Vitals:   09/21/16 0412 09/21/16 0800  BP: (!) 114/53 (!) 136/56  Pulse: 64 64  Resp: 20 20  Temp: 97.5 F (36.4 C) 97.8 F (36.6 C)     Intake/Output Summary (Last 24 hours) at 09/21/16 0810 Last data filed at 09/21/16 0644  Gross per 24 hour  Intake           842.98 ml  Output              500 ml  Net           342.98 ml    GENERAL Alert, no distress and comfortable.Accompanied by her husband.  EYES: no pallor or icterus OROPHARYNX: no thrush or ulceration. NECK: supple, no masses felt LYMPH:  no palpable lymphadenopathy in the cervical, axillary or inguinal regions LUNGS: decreased breath sounds to auscultation at bases and  No wheeze or crackles HEART/CVS: regular rate & rhythm and no murmurs; No lower extremity edema ABDOMEN: abdomen soft, tender  on deep palpation. and normal bowel sounds Musculoskeletal:no cyanosis of digits and no clubbing  PSYCH: alert & oriented x 3 with fluent speech NEURO: no focal motor/sensory deficits SKIN:  no rashes or significant lesions; no facial erythema. Mild swelling/ noted.    Labs:  Lab Results  Component Value Date   WBC 4.8 09/20/2016   HGB 11.7 (L) 09/20/2016   HCT 34.0 (L) 09/20/2016   MCV 83.0 09/20/2016   PLT 251 09/20/2016   NEUTROABS 3.9 09/20/2016    Lab Results  Component Value Date   NA 139 09/20/2016   K 3.6 09/20/2016   CL 104 09/20/2016   CO2 28 09/20/2016    Studies:  No results found.  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  # today day #5 of chemo. Plan tonite cytoxan.  # Facial swelling- likely fluid retention from steroids vs others. Recommend prn benadryl.   # Mild nausea continue when necessary Zofran.  # Constipation-improved. stool softener as needed  # DVT prophylaxis.  # HNT- continue home medications.    # likely plan DC home tomorrow AM.      Cammie Sickle, MD 09/21/2016  8:10 AM

## 2016-09-21 NOTE — Progress Notes (Signed)
Nutrition Follow-up  DOCUMENTATION CODES:   Severe malnutrition in context of acute illness/injury  INTERVENTION:  Continue Ensure Enlive po BID, each supplement provides 350 kcal and 20 grams of protein. Encouraged patient to drink both daily in order to meet needs.  NUTRITION DIAGNOSIS:   Inadequate oral intake related to poor appetite, cancer and cancer related treatments as evidenced by per patient/family report, percent weight loss.  Ongoing.  GOAL:   Patient will meet greater than or equal to 90% of their needs  Progressing.  MONITOR:   PO intake, Supplement acceptance, Labs, Weight trends  REASON FOR ASSESSMENT:   Malnutrition Screening Tool    ASSESSMENT:   71 year old female with PMHx of interstitial lung disease, DM type 2, HTN, triple hit diffuse large B-cell lymphoma s/p cycle #1 of CHOP chemotherapy on 1/5. Chemotherapy has been switched to [facility] and patient has been admitted for cycle #1.   Spoke with patient at bedside. She reports her appetite is doing "okay." She reports she has been drinking 1-2 Ensure per day except for yesterday when she did not have any. She is reporting some nausea and bloating. Discussed that although Ensure does not have lactose, patient can drink any lactose-free oral nutrition supplement she prefers. Encouraged use of a supplement that has 250-350 kcal and 15-20 grams of protein.   Meal Completion: 50-100% In the past 24 hours patient has had approximately 1018 kcal (64% minimum estimated kcal needs) and 46 grams of protein (64% minimum estimated protein needs).   Medications reviewed and include: B-complex with vitamin C daily, Colace, metformin 500 mg daily, Zofran, pantoprazole, prednisone 100 mg BID.   Labs reviewed.  No new weight since admission to trend.  Diet Order:  Diet regular Room service appropriate? Yes; Fluid consistency: Thin  Skin:  Reviewed, no issues  Last BM:  09/19/2016  Height:   Ht Readings from  Last 1 Encounters:  09/17/16 5\' 7"  (1.702 m)    Weight:   Wt Readings from Last 1 Encounters:  09/17/16 140 lb 8 oz (63.7 kg)    Ideal Body Weight:  61.4 kg  BMI:  Body mass index is 22.01 kg/m.  Estimated Nutritional Needs:   Kcal:  1600-1800 (MSJ x 1.3-1.5)  Protein:  76-95 grams (1.2-1.5 grams/kg)  Fluid:  1.6 L/day (25 ml/kg)  EDUCATION NEEDS:   Education needs addressed  Willey Blade, MS, RD, LDN Pager: [phone] After Hours Pager: [phone]

## 2016-09-21 NOTE — Care Management Important Message (Signed)
Important Message  Patient Details  Name: Rose Hill MRN: [id number] Date of Birth: 11/17/1945   Medicare Important Message Given:  Yes    Shelbie Ammons, RN 09/21/2016, 9:34 AM

## 2016-09-21 NOTE — Plan of Care (Signed)
Problem: Skin Integrity: Goal: Status of oral mucous membranes will improve Outcome: Progressing tol chemo well.  Good blood return noted at  Rt chest port

## 2016-09-21 NOTE — Plan of Care (Signed)
Problem: Bowel/Gastric: Goal: Bowel function will improve Outcome: Progressing STOOL SOFTNER  Problem: Nutritional: Goal: Maintenance of adequate nutrition will improve Outcome: Progressing No nausea  Problem: Skin Integrity: Goal: Status of oral mucous membranes will improve Outcome: Progressing No mouth soreness benadryl started  This am  For facial itching/ burning with relief

## 2016-09-22 MED ORDER — HEPARIN SOD (PORK) LOCK FLUSH 100 UNIT/ML IV SOLN
500.0000 [IU] | Freq: Once | INTRAVENOUS | Status: AC
  Administered 2016-09-22: 10:00:00 500 [IU] via INTRAVENOUS
  Filled 2016-09-22: qty 5

## 2016-09-22 NOTE — Discharge Summary (Signed)
[facility] [place] NAME: Rose Hill    MR#:  [id number]  DATE OF BIRTH:  [birth year]  DATE OF ADMISSION:  09/17/2016 ADMITTING PHYSICIAN: Cammie Sickle, MD  DATE OF DISCHARGE: 09/22/2016  PRIMARY CARE PHYSICIAN: Rusty Aus, MD    ADMISSION DIAGNOSIS:  B Cell Lymphoma [place]  DISCHARGE DIAGNOSIS:  Active Problems:   Diffuse large B-cell lymphoma of intra-abdominal lymph nodes (HCC)   Protein-calorie malnutrition, severe   SECONDARY DIAGNOSIS:   Past Medical History:  Diagnosis Date  . Diabetes mellitus without complication ([place])   . History of gastric ulcer   . Hypercholesterolemia   . Hypertension   . ILD (interstitial lung disease) ([place])    8 yrs ago  . Lymphadenopathy     HOSPITAL COURSE:   Pt admitted to hospital for R-EPOCH cycle #1. Tolerated chemo without any adverse events.   DISCHARGE CONDITIONS:   Stable   CONSULTS OBTAINED:  none   DRUG ALLERGIES:   Allergies  Allergen Reactions  . Cefuroxime Axetil Diarrhea    Other reaction(s): Abdominal Pain  . Doxycycline     Other reaction(s): Abdominal Pain severe    DISCHARGE MEDICATIONS:   Current Discharge Medication List    CONTINUE these medications which have NOT CHANGED   Details  b complex vitamins tablet Take 1 tablet by mouth daily.    gabapentin (NEURONTIN) 100 MG capsule Take 1 capsule (100 mg total) by mouth 3 (three) times daily. Qty: 90 capsule, Refills: 1    latanoprost (XALATAN) 0.005 % ophthalmic solution Place 1 drop into both eyes at bedtime.    Associated Diagnoses: Retroperitoneal lymphadenopathy    lidocaine-prilocaine (EMLA) cream Apply cream 1 hour before chemotherapy treatment, place small amount of saran wrap over cream to protect clothing. Qty: 30 g, Refills: 1    losartan-hydrochlorothiazide (HYZAAR) 100-12.5 MG tablet Take 1 tablet by mouth daily.   Associated Diagnoses: Retroperitoneal lymphadenopathy     Magnesium 400 MG TABS Take 1 tablet by mouth daily.    metFORMIN (GLUCOPHAGE) 500 MG tablet Take 1 tablet by mouth daily with breakfast.    Associated Diagnoses: Retroperitoneal lymphadenopathy    ondansetron (ZOFRAN) 8 MG tablet Take 1 tablet (8 mg total) by mouth every 8 (eight) hours as needed for nausea or vomiting. Qty: 30 tablet, Refills: 0   Associated Diagnoses: Chemotherapy-induced nausea; Diffuse large B-cell lymphoma of intra-abdominal lymph nodes (HCC)    oxyCODONE (ROXICODONE) 5 MG immediate release tablet Take 0.5 tablets (2.5 mg total) by mouth every 6 (six) hours as needed for breakthrough pain. Qty: 60 tablet, Refills: 0    pantoprazole (PROTONIX) 40 MG tablet Take 1 tablet by mouth 2 (two) times daily.   Associated Diagnoses: Retroperitoneal lymphadenopathy    pravastatin (PRAVACHOL) 40 MG tablet Take 1 tablet by mouth daily.   Associated Diagnoses: Retroperitoneal lymphadenopathy    prochlorperazine (COMPAZINE) 10 MG tablet Take 1 tablet (10 mg total) by mouth every 6 (six) hours as needed for nausea, vomiting or refractory nausea / vomiting. Qty: 30 tablet, Refills: 0   Associated Diagnoses: Chemotherapy-induced nausea; Diffuse large B-cell lymphoma of intra-abdominal lymph nodes (HCC)    allopurinol (ZYLOPRIM) 300 MG tablet Take 1 tablet (300 mg total) by mouth daily. Qty: 30 tablet, Refills: 0      STOP taking these medications     potassium chloride SA (K-DUR,KLOR-CON) 20 MEQ tablet          DISCHARGE INSTRUCTIONS:  Check  blood counts in cancer center- on Tuesday/ Fridays- starting [09/25/2016] Follow up with Dr.Sharlot Sturkey - in appx 10 days. Will call with appointments.   Call cancer center if any fevers; nausea or vomiting.   DIET:  Regular diet  DISCHARGE CONDITION:  Good  ACTIVITY:  Activity as tolerated  OXYGEN:  Home Oxygen: No.   Oxygen Delivery: room air  DISCHARGE LOCATION:  home   If you experience worsening of your  admission symptoms, develop shortness of breath, life threatening emergency, suicidal or homicidal thoughts you must seek medical attention immediately by calling 911 or calling your MD immediately  if symptoms less severe.  You Must read complete instructions/literature along with all the possible adverse reactions/side effects for all the Medicines you take and that have been prescribed to you. Take any new Medicines after you have completely understood and accpet all the possible adverse reactions/side effects.      On the day of Discharge:  VITAL SIGNS:  Blood pressure 128/60, pulse 85, temperature 97.4 F (36.3 C), temperature source Oral, resp. rate 20, height 5\' 7"  (1.702 m), weight 140 lb 8 oz (63.7 kg), SpO2 98 %.  PHYSICAL EXAMINATION:  GENERAL:  71 y.o.-year-old patient lying in the bed with no acute distress.  EYES: Pupils equal, round, reactive to light and accommodation. No scleral icterus. Extraocular muscles intact.  HEENT: Head atraumatic, normocephalic. Oropharynx and nasopharynx clear.  NECK:  Supple, no jugular venous distention. No thyroid enlargement, no tenderness.  LUNGS: Normal breath sounds bilaterally, no wheezing, rales,rhonchi or crepitation. No use of accessory muscles of respiration.  CARDIOVASCULAR: S1, S2 normal. No murmurs, rubs, or gallops.  ABDOMEN: Soft, non-tender, non-distended. Bowel sounds present. No organomegaly or mass.  EXTREMITIES: No pedal edema, cyanosis, or clubbing.  NEUROLOGIC: Cranial nerves II through XII are intact. Muscle strength 5/5 in all extremities. Sensation intact. Gait not checked.  PSYCHIATRIC: The patient is alert and oriented x 3.  SKIN: No obvious rash, lesion, or ulcer.  DATA REVIEW:   CBC  Recent Labs Lab 09/20/16 0822  WBC 4.8  HGB 11.7*  HCT 34.0*  PLT 251    Chemistries   Recent Labs Lab 09/20/16 0822  NA 139  K 3.6  CL 104  CO2 28  GLUCOSE 136*  BUN 18  CREATININE 0.46  CALCIUM 9.1  AST 14*   ALT 14  ALKPHOS 41  BILITOT 0.6    Cardiac Enzymes No results for input(s): TROPONINI in the last 168 hours.  Microbiology Results  Results for orders placed or performed during the hospital encounter of 08/16/16  Urine culture     Status: Abnormal   Collection Time: 08/16/16 10:03 AM  Result Value Ref Range Status   Specimen Description URINE, RANDOM  Final   Special Requests NONE  Final   Culture (A)  Final    60,000 COLONIES/mL GROUP B STREP(S.AGALACTIAE)ISOLATED TESTING AGAINST S. AGALACTIAE NOT ROUTINELY PERFORMED DUE TO PREDICTABILITY OF AMP/PEN/VAN SUSCEPTIBILITY. Performed at [facility]    Report Status 08/17/2016 FINAL  Final    RADIOLOGY:  No results found.   Management plans discussed with the patient, family and they are in agreement.  CODE STATUS:     Code Status Orders        Start     Ordered   09/17/16 1443  Full code  Continuous     09/17/16 1442    Code Status History    Date Active Date Inactive Code Status Order ID Comments User Context  This patient has a current code status but no historical code status.      TOTAL TIME TAKING CARE OF THIS PATIENT: 30 minutes.    Cammie Sickle M.D on 09/22/2016 at 9:20 AM   CC: Primary care physician; Rusty Aus, MD   Note: This dictation was prepared with Dragon dictation along with smaller phrase technology. Any transcriptional errors that result from this process are unintentional.

## 2016-09-22 NOTE — Progress Notes (Signed)
Rose Hill   DOB:Sep 16, 1945   [id number]    CC: Patient admitted to the hospital for dose adjusted-REPOCH. Today's day #6.Marland Kitchen  No worsening of facial swelling. No itch.  No vomiting. No abdominal cramps.  Denies any tingling or numbness. Appetite is good. No fevers or chills.   ROS: no head aches.   Objective:  Vitals:   09/22/16 0506 09/22/16 0951  BP: 128/60 (!) 127/53  Pulse: 85 74  Resp: 20   Temp: 97.4 F (36.3 C)      Intake/Output Summary (Last 24 hours) at 09/22/16 1028 Last data filed at 09/22/16 0955  Gross per 24 hour  Intake          1912.33 ml  Output             3200 ml  Net         -1287.67 ml    GENERAL Alert, no distress and comfortable.Accompanied by her husband.  EYES: no pallor or icterus OROPHARYNX: no thrush or ulceration. NECK: supple, no masses felt LYMPH:  no palpable lymphadenopathy in the cervical, axillary or inguinal regions LUNGS: decreased breath sounds to auscultation at bases and  No wheeze or crackles HEART/CVS: regular rate & rhythm and no murmurs; No lower extremity edema ABDOMEN: abdomen soft, tender  on deep palpation. and normal bowel sounds Musculoskeletal:no cyanosis of digits and no clubbing  PSYCH: alert & oriented x 3 with fluent speech NEURO: no focal motor/sensory deficits SKIN:  no rashes or significant lesions; no facial erythema. Mild swelling/ noted.    Labs:  Lab Results  Component Value Date   WBC 4.8 09/20/2016   HGB 11.7 (L) 09/20/2016   HCT 34.0 (L) 09/20/2016   MCV 83.0 09/20/2016   PLT 251 09/20/2016   NEUTROABS 3.9 09/20/2016    Lab Results  Component Value Date   NA 139 09/20/2016   K 3.6 09/20/2016   CL 104 09/20/2016   CO2 28 09/20/2016    Studies:  No results found.  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  # today day  #6 of chemo. Finished cytoxan last night; also received onpro last night. ..  # Facial swelling- likely fluid retention from steroids vs others. Recommend prn benadryl.   # Mild nausea continue when necessary Zofran.  # Constipation-improved. stool softener as needed  # DVT prophylaxis.  # HNT- continue home medications.    # plan DC home today...     Cammie Sickle, MD 09/22/2016  10:28 AM

## 2016-09-22 NOTE — Progress Notes (Signed)
11:00 Patient for discharge today. Physical assessment WNL. VS stable. Seen by MD - ready for discharge. Port flushed and un-accessed. Discharge instructions given. Patient and husband understands. Wheeled to door. Patient home with husband.

## 2016-09-28 ENCOUNTER — Telehealth: Payer: Self-pay | Admitting: Internal Medicine

## 2016-09-28 DIAGNOSIS — C8333 Diffuse large B-cell lymphoma, intra-abdominal lymph nodes: Secondary | ICD-10-CM

## 2016-09-28 NOTE — Telephone Encounter (Signed)
Please have pt come for labs- Monday/wed/friday -starting feb 12th cbc with diff. Follow up with me on feb14th/wednesday.

## 2016-10-01 ENCOUNTER — Inpatient Hospital Stay: Payer: Medicare Other | Attending: Internal Medicine

## 2016-10-01 DIAGNOSIS — G629 Polyneuropathy, unspecified: Secondary | ICD-10-CM | POA: Diagnosis not present

## 2016-10-01 DIAGNOSIS — Z7984 Long term (current) use of oral hypoglycemic drugs: Secondary | ICD-10-CM | POA: Insufficient documentation

## 2016-10-01 DIAGNOSIS — Z87891 Personal history of nicotine dependence: Secondary | ICD-10-CM | POA: Diagnosis not present

## 2016-10-01 DIAGNOSIS — Z9221 Personal history of antineoplastic chemotherapy: Secondary | ICD-10-CM | POA: Diagnosis not present

## 2016-10-01 DIAGNOSIS — R591 Generalized enlarged lymph nodes: Secondary | ICD-10-CM | POA: Diagnosis not present

## 2016-10-01 DIAGNOSIS — E119 Type 2 diabetes mellitus without complications: Secondary | ICD-10-CM | POA: Diagnosis not present

## 2016-10-01 DIAGNOSIS — K121 Other forms of stomatitis: Secondary | ICD-10-CM | POA: Insufficient documentation

## 2016-10-01 DIAGNOSIS — Z79899 Other long term (current) drug therapy: Secondary | ICD-10-CM | POA: Diagnosis not present

## 2016-10-01 DIAGNOSIS — Z8711 Personal history of peptic ulcer disease: Secondary | ICD-10-CM | POA: Insufficient documentation

## 2016-10-01 DIAGNOSIS — I10 Essential (primary) hypertension: Secondary | ICD-10-CM | POA: Diagnosis not present

## 2016-10-01 DIAGNOSIS — C8333 Diffuse large B-cell lymphoma, intra-abdominal lymph nodes: Secondary | ICD-10-CM | POA: Diagnosis not present

## 2016-10-01 DIAGNOSIS — E7800 Pure hypercholesterolemia, unspecified: Secondary | ICD-10-CM | POA: Insufficient documentation

## 2016-10-01 DIAGNOSIS — K5900 Constipation, unspecified: Secondary | ICD-10-CM | POA: Diagnosis not present

## 2016-10-01 LAB — CBC WITH DIFFERENTIAL/PLATELET
Basophils Absolute: 0.1 10*3/uL (ref 0–0.1)
Basophils Relative: 1 %
Eosinophils Absolute: 0.2 10*3/uL (ref 0–0.7)
Eosinophils Relative: 2 %
HCT: 33.4 % — ABNORMAL LOW (ref 35.0–47.0)
Hemoglobin: 11.5 g/dL — ABNORMAL LOW (ref 12.0–16.0)
LYMPHS ABS: 0.9 10*3/uL — AB (ref 1.0–3.6)
LYMPHS PCT: 9 %
MCH: 29 pg (ref 26.0–34.0)
MCHC: 34.3 g/dL (ref 32.0–36.0)
MCV: 84.4 fL (ref 80.0–100.0)
MONO ABS: 1 10*3/uL — AB (ref 0.2–0.9)
Monocytes Relative: 10 %
Neutro Abs: 7.9 10*3/uL — ABNORMAL HIGH (ref 1.4–6.5)
Neutrophils Relative %: 78 %
Platelets: 144 10*3/uL — ABNORMAL LOW (ref 150–440)
RBC: 3.96 MIL/uL (ref 3.80–5.20)
RDW: 17.1 % — AB (ref 11.5–14.5)
WBC: 10.1 10*3/uL (ref 3.6–11.0)

## 2016-10-01 NOTE — Addendum Note (Signed)
Addended by: Sabino Gasser on: 10/01/2016 09:08 AM   Modules accepted: Orders

## 2016-10-01 NOTE — Telephone Encounter (Signed)
Spoke with patient. She will come at 2:30pm today for labs. She will see md at 11am on Wednesday and labs at 2:30pm this Friday.

## 2016-10-03 ENCOUNTER — Inpatient Hospital Stay (HOSPITAL_BASED_OUTPATIENT_CLINIC_OR_DEPARTMENT_OTHER): Payer: Medicare Other | Admitting: Internal Medicine

## 2016-10-03 ENCOUNTER — Inpatient Hospital Stay: Payer: Medicare Other

## 2016-10-03 VITALS — BP 124/76 | HR 90 | Temp 98.2°F | Wt 139.0 lb

## 2016-10-03 DIAGNOSIS — R591 Generalized enlarged lymph nodes: Secondary | ICD-10-CM

## 2016-10-03 DIAGNOSIS — C8333 Diffuse large B-cell lymphoma, intra-abdominal lymph nodes: Secondary | ICD-10-CM | POA: Diagnosis not present

## 2016-10-03 DIAGNOSIS — E119 Type 2 diabetes mellitus without complications: Secondary | ICD-10-CM

## 2016-10-03 DIAGNOSIS — Z8711 Personal history of peptic ulcer disease: Secondary | ICD-10-CM

## 2016-10-03 DIAGNOSIS — G629 Polyneuropathy, unspecified: Secondary | ICD-10-CM | POA: Diagnosis not present

## 2016-10-03 DIAGNOSIS — K121 Other forms of stomatitis: Secondary | ICD-10-CM

## 2016-10-03 DIAGNOSIS — Z9221 Personal history of antineoplastic chemotherapy: Secondary | ICD-10-CM

## 2016-10-03 DIAGNOSIS — Z87891 Personal history of nicotine dependence: Secondary | ICD-10-CM

## 2016-10-03 DIAGNOSIS — K5900 Constipation, unspecified: Secondary | ICD-10-CM

## 2016-10-03 DIAGNOSIS — I10 Essential (primary) hypertension: Secondary | ICD-10-CM

## 2016-10-03 DIAGNOSIS — Z79899 Other long term (current) drug therapy: Secondary | ICD-10-CM

## 2016-10-03 DIAGNOSIS — Z7984 Long term (current) use of oral hypoglycemic drugs: Secondary | ICD-10-CM

## 2016-10-03 DIAGNOSIS — E7800 Pure hypercholesterolemia, unspecified: Secondary | ICD-10-CM

## 2016-10-03 LAB — CBC WITH DIFFERENTIAL/PLATELET
BASOS ABS: 0.1 10*3/uL (ref 0–0.1)
BASOS PCT: 1 %
EOS ABS: 0.2 10*3/uL (ref 0–0.7)
EOS PCT: 2 %
HCT: 33.9 % — ABNORMAL LOW (ref 35.0–47.0)
Hemoglobin: 11.5 g/dL — ABNORMAL LOW (ref 12.0–16.0)
LYMPHS PCT: 9 %
Lymphs Abs: 0.7 10*3/uL — ABNORMAL LOW (ref 1.0–3.6)
MCH: 29.2 pg (ref 26.0–34.0)
MCHC: 33.9 g/dL (ref 32.0–36.0)
MCV: 86.1 fL (ref 80.0–100.0)
Monocytes Absolute: 0.9 10*3/uL (ref 0.2–0.9)
Monocytes Relative: 11 %
Neutro Abs: 6.8 10*3/uL — ABNORMAL HIGH (ref 1.4–6.5)
Neutrophils Relative %: 77 %
PLATELETS: 155 10*3/uL (ref 150–440)
RBC: 3.93 MIL/uL (ref 3.80–5.20)
RDW: 17.5 % — AB (ref 11.5–14.5)
WBC: 8.7 10*3/uL (ref 3.6–11.0)

## 2016-10-03 NOTE — Progress Notes (Signed)
Patient here today for follow up.   

## 2016-10-03 NOTE — Progress Notes (Signed)
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  female with above history of diffuse large B-cell lymphoma [triple hit] currently on DA- [facility] is here For follow-up.  Patient had a first cycle of dose adjusted R-EPOCH in the hospital.  She is mildly constipated. She has been taking Senokot. Complains of mild tingling and numbness in her feet. Does not interrupt her daily life. No fever no chills. Mild fatigue. No abdominal pain nausea vomiting. She had some soreness in the mouth improved.  ROS: A complete 10 point review of system is done which is negative except mentioned above in history of present illness  MEDICAL HISTORY:  Past Medical History:  Diagnosis Date  . Diabetes mellitus without complication ([place])   . History of gastric ulcer   . Hypercholesterolemia   . Hypertension   . ILD (interstitial lung disease) ([place])    8 yrs ago  . Lymphadenopathy     SURGICAL HISTORY: Past Surgical History:  Procedure Laterality Date  . BREAST BIOPSY Left 2010   neg  . HERNIA REPAIR    . PARTIAL HYSTERECTOMY     age 69  . PERIPHERAL VASCULAR  CATHETERIZATION N/A 08/22/2016   Procedure: [name];  Surgeon: [name], MD;  Location: [facility];  Service: Cardiovascular;  Laterality: N/A;  . TONSILLECTOMY      SOCIAL HISTORY: quit smoking 40 years ago; human resources retd; in [place]; no alcohol Social History   Social History  . Marital status: Married    Spouse name: Jaquan Jane  . Number of children: 2  . Years of education: N/A   Occupational History  . Retired    Social History Main Topics  . Smoking status: Former Smoker    Quit date: 08/21/1958  . Smokeless tobacco: Never Used  . Alcohol use No  . Drug use: No  . Sexual activity: No   Other Topics Concern  . Not on file   Social History Narrative   Not applicable at this time    FAMILY HISTORY: Family History  Problem Relation Age of Onset  . Heart disease Mother   . Heart disease Father   . Heart disease Brother     ALLERGIES:  is allergic to cefuroxime axetil and doxycycline.  MEDICATIONS:  Current Outpatient Prescriptions  Medication Sig Dispense Refill  . allopurinol (ZYLOPRIM) 300 MG tablet Take 1 tablet (300 mg total) by mouth daily. 30 tablet 0  . b complex vitamins tablet Take 1 tablet by mouth daily.    Marland Kitchen gabapentin (NEURONTIN) 100 MG capsule Take 1 capsule (100 mg  total) by mouth 3 (three) times daily. 90 capsule 1  . latanoprost (XALATAN) 0.005 % ophthalmic solution Place 1 drop into both eyes at bedtime.     . lidocaine-prilocaine (EMLA) cream Apply cream 1 hour before chemotherapy treatment, place small amount of saran wrap over cream to protect clothing. 30 g 1  . losartan-hydrochlorothiazide (HYZAAR) 100-12.5 MG tablet Take 1 tablet by mouth daily.    . Magnesium 400 MG TABS Take 1 tablet by mouth daily.    . metFORMIN (GLUCOPHAGE) 500 MG tablet Take 1 tablet by mouth daily with breakfast.     . ondansetron (ZOFRAN) 8 MG tablet Take 1 tablet (8 mg total) by mouth every 8 (eight) hours as needed for  nausea or vomiting. 30 tablet 0  . oxyCODONE (ROXICODONE) 5 MG immediate release tablet Take 0.5 tablets (2.5 mg total) by mouth every 6 (six) hours as needed for breakthrough pain. 60 tablet 0  . pantoprazole (PROTONIX) 40 MG tablet Take 1 tablet by mouth 2 (two) times daily.    . pravastatin (PRAVACHOL) 40 MG tablet Take 1 tablet by mouth daily.    . prochlorperazine (COMPAZINE) 10 MG tablet Take 1 tablet (10 mg total) by mouth every 6 (six) hours as needed for nausea, vomiting or refractory nausea / vomiting. 30 tablet 0   No current facility-administered medications for this visit.       Marland Kitchen  PHYSICAL EXAMINATION: ECOG PERFORMANCE STATUS: 1 - Symptomatic but completely ambulatory  Vitals:   10/03/16 1108  BP: 124/76  Pulse: 90  Temp: 98.2 F (36.8 C)   Filed Weights   10/03/16 1108  Weight: 139 lb (63 kg)    GENERAL: Well-nourished well-developed; Alert, no distress and comfortable.   With her husband. EYES: no pallor or icterus OROPHARYNX: no thrush or ulceration; good dentition  NECK: supple, no masses felt LYMPH:  no palpable lymphadenopathy in the cervical, axillary or inguinal regions LUNGS: clear to auscultation and  No wheeze or crackles HEART/CVS: regular rate & rhythm and no murmurs; No lower extremity edema ABDOMEN: abdomen soft, mild tenderness on deep palpation and normal bowel sounds. No hepatosplenomegaly Musculoskeletal:no cyanosis of digits and no clubbing  PSYCH: alert & oriented x 3 with fluent speech NEURO: no focal motor/sensory deficits SKIN:  no rashes or significant lesions  LABORATORY DATA:  I have reviewed the data as listed Lab Results  Component Value Date   WBC 8.7 10/03/2016   HGB 11.5 (L) 10/03/2016   HCT 33.9 (L) 10/03/2016   MCV 86.1 10/03/2016   PLT 155 10/03/2016    Recent Labs  08/31/16 1022 09/14/16 0754 09/17/16 0819 09/18/16 1539 09/20/16 0822  NA 129* 138 138 139 139  K 3.4* 2.6* 4.1 4.0 3.6  CL 93* 98* 101 106 104   CO2 26 31 28 25 28   GLUCOSE 99991111* 139* 147* 210* 136*  BUN 12 6 8 9 18   CREATININE 0.63 0.43* 0.61 0.70 0.46  CALCIUM 9.0 9.2 9.3 9.4 9.1  GFRNONAA >60 >60 >60 >60 >60  GFRAA >60 >60 >60 >60 >60  PROT 7.4 7.0  --   --  6.2*  ALBUMIN 4.1 3.6  --   --  3.4*  AST 22 20  --   --  14*  ALT 19 19  --   --  14  ALKPHOS 56 63  --   --  41  BILITOT 0.7 0.5  --   --  0.6    RADIOGRAPHIC STUDIES: I have  personally reviewed the radiological images as listed and agreed with the findings in the report. Dg Fluoro Guided Loc Of Needle/cath Tip For Spinal Inject Lt  Result Date: 09/14/2016 CLINICAL DATA:  Lymphoma. EXAM: DIAGNOSTIC LUMBAR PUNCTURE UNDER FLUOROSCOPIC GUIDANCE FLUOROSCOPY TIME:  Fluoroscopy Time:  2 minutes 30 seconds. Radiation Exposure Index (if provided by the fluoroscopic device): 65.2 mGy Number of Acquired Spot Images: 1 PROCEDURE: After discussing this procedure with the patient informed consent was obtained . Patient was sterilely prepped and draped. 1% lidocaine used for local anesthesia . 22 gauge spinal needle was advanced into the spinal canal at the level of L3-L4. Opening pressure of approximately 9 cm noted. Clear CSF was obtained. A total of 10 cc obtained. IMPRESSION: Successful fluoroscopically directed lumbar puncture. 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 S/p  #1 of DA-EPOCH [s/p #1 cycle of R-CHOP] chemotherapy.  # Patient tolerated chemotherapy well. The plan proceeding with chemotherapy cycle #2 on February 19. Patient wants to have it done outpatient. Discussed with pharmacy. No clinical progression noted. We'll repeat a PET scan after 3 cycles; will order at next visit.  # Oral ulcers-salt/baking soda; Magic mouth- new  script given.   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 Monitor for now.  [facility]  # Constipation from chemotherapy. Recommend MiraLAX.  All questions were answered. The patient knows to call the clinic with any problems, questions or concerns.    Cammie Sickle, MD 10/03/2016 1:39 PM

## 2016-10-03 NOTE — Assessment & Plan Note (Addendum)
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 S/p  #1 of DA-EPOCH [s/p #1 cycle of R-CHOP] chemotherapy.  # Patient tolerated chemotherapy well. The plan proceeding with chemotherapy cycle #2 on February 19. Patient wants to have it done outpatient. Discussed with pharmacy. No clinical progression noted. We'll repeat a PET scan after 3 cycles; will order at next visit.  # Oral ulcers-salt/baking soda; Magic mouth- new script given.   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 Monitor for now.  [facility]  # Constipation from chemotherapy. Recommend MiraLAX.

## 2016-10-05 ENCOUNTER — Other Ambulatory Visit: Payer: Self-pay | Admitting: Internal Medicine

## 2016-10-05 ENCOUNTER — Inpatient Hospital Stay: Payer: Medicare Other

## 2016-10-05 DIAGNOSIS — C8333 Diffuse large B-cell lymphoma, intra-abdominal lymph nodes: Secondary | ICD-10-CM

## 2016-10-05 LAB — CBC WITH DIFFERENTIAL/PLATELET
Basophils Absolute: 0.1 10*3/uL (ref 0–0.1)
Basophils Relative: 1 %
EOS ABS: 0.2 10*3/uL (ref 0–0.7)
Eosinophils Relative: 2 %
HCT: 33.3 % — ABNORMAL LOW (ref 35.0–47.0)
HEMOGLOBIN: 11.3 g/dL — AB (ref 12.0–16.0)
LYMPHS ABS: 0.9 10*3/uL — AB (ref 1.0–3.6)
LYMPHS PCT: 11 %
MCH: 28.9 pg (ref 26.0–34.0)
MCHC: 33.8 g/dL (ref 32.0–36.0)
MCV: 85.4 fL (ref 80.0–100.0)
MONOS PCT: 12 %
Monocytes Absolute: 1 10*3/uL — ABNORMAL HIGH (ref 0.2–0.9)
NEUTROS PCT: 74 %
Neutro Abs: 6.1 10*3/uL (ref 1.4–6.5)
PLATELETS: 216 10*3/uL (ref 150–440)
RBC: 3.9 MIL/uL (ref 3.80–5.20)
RDW: 18 % — AB (ref 11.5–14.5)
WBC: 8.3 10*3/uL (ref 3.6–11.0)

## 2016-10-05 LAB — COMPREHENSIVE METABOLIC PANEL
ALBUMIN: 3.7 g/dL (ref 3.5–5.0)
ALT: 18 U/L (ref 14–54)
AST: 19 U/L (ref 15–41)
Alkaline Phosphatase: 49 U/L (ref 38–126)
Anion gap: 8 (ref 5–15)
BUN: 13 mg/dL (ref 6–20)
CHLORIDE: 101 mmol/L (ref 101–111)
CO2: 28 mmol/L (ref 22–32)
Calcium: 8.9 mg/dL (ref 8.9–10.3)
Creatinine, Ser: 0.44 mg/dL (ref 0.44–1.00)
GFR calc Af Amer: >60 mL/min (ref >60)
GFR calc non Af Amer: >60 mL/min (ref >60)
GLUCOSE: 116 mg/dL — AB (ref 65–99)
Potassium: 3 mmol/L — ABNORMAL LOW (ref 3.5–5.1)
SODIUM: 137 mmol/L (ref 135–145)
Total Bilirubin: 0.6 mg/dL (ref 0.3–1.2)
Total Protein: 6.5 g/dL (ref 6.5–8.1)

## 2016-10-08 ENCOUNTER — Ambulatory Visit: Payer: Medicare Other

## 2016-10-08 ENCOUNTER — Inpatient Hospital Stay: Admit: 2016-10-08 | Payer: Self-pay | Admitting: Internal Medicine

## 2016-10-08 ENCOUNTER — Inpatient Hospital Stay
Admission: AD | Admit: 2016-10-08 | Discharge: 2016-10-12 | DRG: 847 | Disposition: A | Discharge status: Home / Self Care | Payer: Medicare Other | Source: Ambulatory Visit | Attending: Internal Medicine | Admitting: Internal Medicine

## 2016-10-08 DIAGNOSIS — T451X5A Adverse effect of antineoplastic and immunosuppressive drugs, initial encounter: Secondary | ICD-10-CM | POA: Diagnosis present

## 2016-10-08 DIAGNOSIS — E119 Type 2 diabetes mellitus without complications: Secondary | ICD-10-CM | POA: Diagnosis present

## 2016-10-08 DIAGNOSIS — E876 Hypokalemia: Secondary | ICD-10-CM | POA: Diagnosis present

## 2016-10-08 DIAGNOSIS — Z5111 Encounter for antineoplastic chemotherapy: Principal | ICD-10-CM

## 2016-10-08 DIAGNOSIS — Z7901 Long term (current) use of anticoagulants: Secondary | ICD-10-CM

## 2016-10-08 DIAGNOSIS — Z87891 Personal history of nicotine dependence: Secondary | ICD-10-CM

## 2016-10-08 DIAGNOSIS — R232 Flushing: Secondary | ICD-10-CM | POA: Diagnosis not present

## 2016-10-08 DIAGNOSIS — Z79899 Other long term (current) drug therapy: Secondary | ICD-10-CM

## 2016-10-08 DIAGNOSIS — C8330 Diffuse large B-cell lymphoma, unspecified site: Secondary | ICD-10-CM

## 2016-10-08 DIAGNOSIS — Z8249 Family history of ischemic heart disease and other diseases of the circulatory system: Secondary | ICD-10-CM

## 2016-10-08 DIAGNOSIS — Z90711 Acquired absence of uterus with remaining cervical stump: Secondary | ICD-10-CM

## 2016-10-08 DIAGNOSIS — G629 Polyneuropathy, unspecified: Secondary | ICD-10-CM

## 2016-10-08 DIAGNOSIS — C8333 Diffuse large B-cell lymphoma, intra-abdominal lymph nodes: Secondary | ICD-10-CM | POA: Diagnosis present

## 2016-10-08 DIAGNOSIS — J8410 Pulmonary fibrosis, unspecified: Secondary | ICD-10-CM

## 2016-10-08 DIAGNOSIS — K5903 Drug induced constipation: Secondary | ICD-10-CM | POA: Diagnosis present

## 2016-10-08 DIAGNOSIS — I10 Essential (primary) hypertension: Secondary | ICD-10-CM | POA: Diagnosis present

## 2016-10-08 DIAGNOSIS — E43 Unspecified severe protein-calorie malnutrition: Secondary | ICD-10-CM

## 2016-10-08 DIAGNOSIS — K5900 Constipation, unspecified: Secondary | ICD-10-CM

## 2016-10-08 DIAGNOSIS — Z8711 Personal history of peptic ulcer disease: Secondary | ICD-10-CM | POA: Diagnosis not present

## 2016-10-08 DIAGNOSIS — Z7984 Long term (current) use of oral hypoglycemic drugs: Secondary | ICD-10-CM

## 2016-10-08 DIAGNOSIS — E7800 Pure hypercholesterolemia, unspecified: Secondary | ICD-10-CM | POA: Diagnosis present

## 2016-10-08 DIAGNOSIS — G620 Drug-induced polyneuropathy: Secondary | ICD-10-CM | POA: Diagnosis present

## 2016-10-08 DIAGNOSIS — R590 Localized enlarged lymph nodes: Secondary | ICD-10-CM

## 2016-10-08 DIAGNOSIS — R1909 Other intra-abdominal and pelvic swelling, mass and lump: Secondary | ICD-10-CM

## 2016-10-08 MED ORDER — OXYCODONE HCL 5 MG PO TABS: 2.5000 mg | ORAL_TABLET | Freq: Four times a day (QID) | ORAL | Status: DC | PRN

## 2016-10-08 MED ORDER — GABAPENTIN 100 MG PO CAPS
100.0000 mg | ORAL_CAPSULE | Freq: Three times a day (TID) | ORAL | Status: DC
  Administered 2016-10-08 – 2016-10-12 (×13): 100 mg via ORAL
  Filled 2016-10-08 (×13): qty 1

## 2016-10-08 MED ORDER — ONDANSETRON HCL 4 MG PO TABS: 4.0000 mg | ORAL_TABLET | Freq: Three times a day (TID) | ORAL | Status: DC | PRN

## 2016-10-08 MED ORDER — PANTOPRAZOLE SODIUM 40 MG PO TBEC
40.0000 mg | DELAYED_RELEASE_TABLET | Freq: Two times a day (BID) | ORAL | Status: DC
  Administered 2016-10-08 – 2016-10-12 (×8): 40 mg via ORAL
  Filled 2016-10-08 (×8): qty 1

## 2016-10-08 MED ORDER — ACETAMINOPHEN 325 MG PO TABS: 650.0000 mg | ORAL_TABLET | ORAL | Status: DC | PRN

## 2016-10-08 MED ORDER — ONDANSETRON HCL 4 MG/2ML IJ SOLN: 4.0000 mg | Freq: Three times a day (TID) | INTRAMUSCULAR | Status: DC | PRN

## 2016-10-08 MED ORDER — ALUM & MAG HYDROXIDE-SIMETH 200-200-20 MG/5ML PO SUSP: 60.0000 mL | ORAL | Status: DC | PRN

## 2016-10-08 MED ORDER — B COMPLEX-C PO TABS
1.0000 | ORAL_TABLET | Freq: Every day | ORAL | Status: DC
  Administered 2016-10-09 – 2016-10-12 (×4): 1 via ORAL
  Filled 2016-10-08 (×5): qty 1

## 2016-10-08 MED ORDER — SODIUM CHLORIDE 0.9 % IV SOLN
700.0000 mg | Freq: Once | INTRAVENOUS | Status: AC
  Administered 2016-10-08: 13:00:00 700 mg via INTRAVENOUS
  Filled 2016-10-08: qty 50

## 2016-10-08 MED ORDER — SODIUM CHLORIDE 0.9 % IV SOLN
8.0000 mg | Freq: Three times a day (TID) | INTRAVENOUS | Status: DC | PRN
  Filled 2016-10-08: qty 4

## 2016-10-08 MED ORDER — LATANOPROST 0.005 % OP SOLN
1.0000 [drp] | Freq: Every day | OPHTHALMIC | Status: DC
  Administered 2016-10-08 – 2016-10-11 (×4): 1 [drp] via OPHTHALMIC
  Filled 2016-10-08: qty 2.5

## 2016-10-08 MED ORDER — DIPHENHYDRAMINE HCL 25 MG PO CAPS
50.0000 mg | ORAL_CAPSULE | Freq: Once | ORAL | Status: AC
  Administered 2016-10-08: 12:00:00 50 mg via ORAL
  Filled 2016-10-08: qty 2

## 2016-10-08 MED ORDER — HYDROCHLOROTHIAZIDE 12.5 MG PO CAPS
12.5000 mg | ORAL_CAPSULE | Freq: Every day | ORAL | Status: DC
  Administered 2016-10-09 – 2016-10-12 (×4): 12.5 mg via ORAL
  Filled 2016-10-08 (×4): qty 1

## 2016-10-08 MED ORDER — LOSARTAN POTASSIUM-HCTZ 100-12.5 MG PO TABS: 1.0000 | ORAL_TABLET | Freq: Every day | ORAL | Status: DC

## 2016-10-08 MED ORDER — VINCRISTINE SULFATE CHEMO INJECTION 1 MG/ML
Freq: Once | INTRAVENOUS | Status: AC
  Administered 2016-10-08: 15:00:00 via INTRAVENOUS
  Filled 2016-10-08: qty 11

## 2016-10-08 MED ORDER — PROCHLORPERAZINE MALEATE 10 MG PO TABS
10.0000 mg | ORAL_TABLET | Freq: Four times a day (QID) | ORAL | Status: DC | PRN
  Filled 2016-10-08: qty 1

## 2016-10-08 MED ORDER — SENNOSIDES-DOCUSATE SODIUM 8.6-50 MG PO TABS
1.0000 | ORAL_TABLET | Freq: Every evening | ORAL | Status: DC | PRN
  Administered 2016-10-10: 21:00:00 1 via ORAL
  Filled 2016-10-08 (×2): qty 1

## 2016-10-08 MED ORDER — ACETAMINOPHEN 325 MG PO TABS
650.0000 mg | ORAL_TABLET | Freq: Once | ORAL | Status: AC
  Administered 2016-10-08: 12:00:00 650 mg via ORAL
  Filled 2016-10-08: qty 2

## 2016-10-08 MED ORDER — SODIUM CHLORIDE 0.9 % IV SOLN
INTRAVENOUS | Status: DC
  Administered 2016-10-08: 12:00:00 20 mL/h via INTRAVENOUS
  Administered 2016-10-10: 08:00:00 via INTRAVENOUS
  Administered 2016-10-12: 09:00:00 20 mL/h via INTRAVENOUS

## 2016-10-08 MED ORDER — LOSARTAN POTASSIUM 50 MG PO TABS
100.0000 mg | ORAL_TABLET | Freq: Every day | ORAL | Status: DC
  Administered 2016-10-09 – 2016-10-12 (×4): 100 mg via ORAL
  Filled 2016-10-08 (×4): qty 2

## 2016-10-08 MED ORDER — PREDNISONE 50 MG PO TABS
105.0000 mg | ORAL_TABLET | Freq: Two times a day (BID) | ORAL | Status: DC
  Administered 2016-10-08 – 2016-10-12 (×9): 105 mg via ORAL
  Filled 2016-10-08 (×10): qty 2

## 2016-10-08 MED ORDER — ENOXAPARIN SODIUM 40 MG/0.4ML ~~LOC~~ SOLN
40.0000 mg | SUBCUTANEOUS | Status: DC
  Administered 2016-10-09 – 2016-10-11 (×3): 40 mg via SUBCUTANEOUS
  Filled 2016-10-08 (×4): qty 0.4

## 2016-10-08 MED ORDER — ONDANSETRON HCL 4 MG PO TABS: 8.0000 mg | ORAL_TABLET | Freq: Three times a day (TID) | ORAL | Status: DC | PRN

## 2016-10-08 MED ORDER — ONDANSETRON 4 MG PO TBDP
4.0000 mg | ORAL_TABLET | Freq: Three times a day (TID) | ORAL | Status: DC | PRN
  Filled 2016-10-08: qty 2

## 2016-10-08 MED ORDER — METFORMIN HCL 500 MG PO TABS
500.0000 mg | ORAL_TABLET | Freq: Every day | ORAL | Status: DC
  Administered 2016-10-09 – 2016-10-12 (×4): 500 mg via ORAL
  Filled 2016-10-08 (×4): qty 1

## 2016-10-08 MED ORDER — MAGNESIUM OXIDE 400 (241.3 MG) MG PO TABS
400.0000 mg | ORAL_TABLET | Freq: Every day | ORAL | Status: DC
  Administered 2016-10-08 – 2016-10-12 (×5): 400 mg via ORAL
  Filled 2016-10-08 (×5): qty 1

## 2016-10-08 MED ORDER — RITUXIMAB CHEMO INJECTION 500 MG/50ML
375.0000 mg/m2 | Freq: Once | INTRAVENOUS | Status: DC
Start: 2016-10-08 — End: 2016-10-08

## 2016-10-08 MED ORDER — HYDROCORTISONE 2.5 % RE CREA
1.0000 "application " | TOPICAL_CREAM | Freq: Two times a day (BID) | RECTAL | Status: DC | PRN
  Filled 2016-10-08: qty 28.35

## 2016-10-08 MED ORDER — ENOXAPARIN SODIUM 40 MG/0.4ML ~~LOC~~ SOLN: 40.0000 mg | SUBCUTANEOUS | Status: DC

## 2016-10-08 MED ORDER — ALLOPURINOL 100 MG PO TABS
300.0000 mg | ORAL_TABLET | Freq: Every day | ORAL | Status: DC
  Administered 2016-10-09 – 2016-10-12 (×4): 300 mg via ORAL
  Filled 2016-10-08 (×4): qty 3

## 2016-10-08 MED ORDER — PRAVASTATIN SODIUM 40 MG PO TABS
40.0000 mg | ORAL_TABLET | Freq: Every day | ORAL | Status: DC
  Administered 2016-10-08 – 2016-10-11 (×4): 40 mg via ORAL
  Filled 2016-10-08 (×4): qty 1

## 2016-10-08 MED ORDER — GUAIFENESIN-DM 100-10 MG/5ML PO SYRP: 10.0000 mL | ORAL_SOLUTION | ORAL | Status: DC | PRN

## 2016-10-08 MED ORDER — SODIUM CHLORIDE 0.9 % IV SOLN
Freq: Once | INTRAVENOUS | Status: AC
  Administered 2016-10-08: 12:00:00 via INTRAVENOUS

## 2016-10-08 MED ORDER — SODIUM CHLORIDE 0.9 % IV SOLN
Freq: Once | INTRAVENOUS | Status: AC
  Administered 2016-10-08: 12:00:00 8 mg via INTRAVENOUS
  Filled 2016-10-08: qty 4

## 2016-10-08 NOTE — Progress Notes (Signed)
Dr Burlene Arnt notified  Prior to chemo of patient having numbness in bilateral lower extremities.  Per patient started on Friday 10/05/2016.

## 2016-10-08 NOTE — Assessment & Plan Note (Addendum)
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  # Proceed with chemotherapy cycle #2  Today. Doses have been adjusted according to the patient's blood count nadir. [Doxorubicin; etoposide; vincristine increased by 20%]. Burnis Medin repeat a PET scan after 3 cycles.   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 On neurontin.   # Constipation from chemotherapy. Recommend MiraLAX.  # Hypokalemia- K- 3.0; repeat labs tomorrow.   # DVT prophylaxis.

## 2016-10-08 NOTE — H&P (Signed)
Patient Care Team: Rusty Aus, [name]  CHIEF COMPLAINTS/ : Diffuse large B-cell lymphoma/ admission the hospital for inpatient chemotherapy.  HISTORY OF PRESENTING ILLNESS:  [place] 71 y.o.  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   Patient complains of mild tingling and numbness of her bilateral lower extremities. More so on the left side. Otherwise no constipation. No chills. Denies any abdominal pain.  ROS: A complete 10 point review of system is done which is negative except mentioned above in history of present illness  MEDICAL HISTORY:  Past Medical History:  Diagnosis Date  . Diabetes mellitus without complication ([place])   . History of gastric ulcer   . Hypercholesterolemia   . Hypertension   . ILD (interstitial lung disease) ([place])    8 yrs ago  . Lymphadenopathy     SURGICAL HISTORY: Past Surgical History:  Procedure Laterality Date  . BREAST BIOPSY Left 2010   neg  . HERNIA REPAIR    . PARTIAL HYSTERECTOMY     age 67  . PERIPHERAL VASCULAR CATHETERIZATION N/A 08/22/2016   Procedure: [name];  Surgeon: [name], MD;  Location: [facility];  Service: Cardiovascular;  Laterality: N/A;  . TONSILLECTOMY      SOCIAL HISTORY: Social History   Social History  . Marital status: Married    Spouse name: Albertina Piper  . Number of children: 2  . Years of education: N/A   Occupational History  . Retired    Social History Main Topics  . Smoking status: Former Smoker    Quit date: 08/21/1958  . Smokeless tobacco: Never Used  . Alcohol use No  . Drug use: No  . Sexual activity: No   Other Topics Concern  . Not on file   Social History Narrative   Not applicable at this time    FAMILY HISTORY: Family History  Problem Relation Age of Onset  . Heart disease Mother   . Heart disease Father   .  Heart disease Brother     ALLERGIES:  is allergic to cefuroxime axetil and doxycycline.  MEDICATIONS:  Current Facility-Administered Medications  Medication Dose Route Frequency Provider Last Rate Last Dose  . 0.9 %  sodium chloride infusion   Intravenous Continuous Cammie Sickle, MD 20 mL/hr at 10/08/16 1214 20 mL/hr at 10/08/16 1214  . acetaminophen (TYLENOL) tablet 650 mg  650 mg Oral Q4H PRN Cammie Sickle, MD      . allopurinol (ZYLOPRIM) tablet 300 mg  300 mg Oral Daily Cammie Sickle, MD      . alum & mag hydroxide-simeth (MAALOX/MYLANTA) 200-200-20 MG/5ML suspension 60 mL  60 mL Oral Q4H PRN Cammie Sickle, MD      . B-complex with vitamin C tablet 1 tablet  1 tablet Oral Daily Cammie Sickle, MD      . DOXOrubicin (ADRIAMYCIN) 22 mg, etoposide (VEPESID) 106 mg, vinCRIStine (ONCOVIN) 0.8 mg in sodium chloride 0.9 % 500 mL chemo infusion   Intravenous Once Cammie Sickle, MD 24 mL/hr at 10/08/16 1504    . enoxaparin (LOVENOX) injection 40 mg  40 mg Subcutaneous Q24H Cammie Sickle, MD      . gabapentin (NEURONTIN) capsule 100 mg  100 mg Oral TID Cammie Sickle, MD      . guaiFENesin-dextromethorphan (ROBITUSSIN DM) 100-10 MG/5ML  syrup 10 mL  10 mL Oral Q4H PRN Cammie Sickle, MD      . losartan (COZAAR) tablet 100 mg  100 mg Oral Daily Cammie Sickle, MD       And  . hydrochlorothiazide (MICROZIDE) capsule 12.5 mg  12.5 mg Oral Daily Cammie Sickle, MD      . hydrocortisone (ANUSOL-HC) 2.5 % rectal cream 1 application  1 application Rectal BID PRN Cammie Sickle, MD      . latanoprost (XALATAN) 0.005 % ophthalmic solution 1 drop  1 drop Both Eyes QHS Cammie Sickle, MD      . magnesium oxide (MAG-OX) tablet 400 mg  400 mg Oral Daily Cammie Sickle, MD      . Derrill Memo ON 10/09/2016] metFORMIN (GLUCOPHAGE) tablet 500 mg  500 mg Oral Q breakfast Cammie Sickle, MD      . ondansetron (ZOFRAN) tablet 4-8  mg  4-8 mg Oral Q8H PRN Cammie Sickle, MD       Or  . ondansetron (ZOFRAN-ODT) disintegrating tablet 4-8 mg  4-8 mg Oral Q8H PRN Cammie Sickle, MD       Or  . ondansetron (ZOFRAN) injection 4 mg  4 mg Intravenous Q8H PRN Cammie Sickle, MD       Or  . ondansetron (ZOFRAN) 8 mg in sodium chloride 0.9 % 50 mL IVPB  8 mg Intravenous Q8H PRN Cammie Sickle, MD      . ondansetron (ZOFRAN) tablet 8 mg  8 mg Oral Q8H PRN Cammie Sickle, MD      . oxyCODONE (Oxy IR/ROXICODONE) immediate release tablet 2.5 mg  2.5 mg Oral Q6H PRN Cammie Sickle, MD      . pantoprazole (PROTONIX) EC tablet 40 mg  40 mg Oral BID Cammie Sickle, MD      . pravastatin (PRAVACHOL) tablet 40 mg  40 mg Oral q1800 Cammie Sickle, MD      . predniSONE (DELTASONE) tablet 105 mg  105 mg Oral BID WC Cammie Sickle, MD      . prochlorperazine (COMPAZINE) tablet 10 mg  10 mg Oral Q6H PRN Cammie Sickle, MD      . senna-docusate (Senokot-S) tablet 1 tablet  1 tablet Oral QHS PRN Cammie Sickle, MD          .  PHYSICAL EXAMINATION:  Vitals:   10/08/16 0957 10/08/16 1258  BP: 117/62 (!) 108/56  Pulse: 98 80  Resp: 18 18  Temp: 97.9 F (36.6 C) 98.1 F (36.7 C)   Filed Weights   10/08/16 1031  Weight: 140 lb (63.5 kg)    GENERAL: Well-nourished well-developed; Alert, no distress and comfortable.  She is alone. EYES: no pallor or icterus OROPHARYNX: no thrush or ulceration. NECK: supple, no masses felt LYMPH:  no palpable lymphadenopathy in the cervical, axillary or inguinal regions LUNGS: decreased breath sounds to auscultation at bases and  No wheeze or crackles HEART/CVS: regular rate & rhythm and no murmurs; No lower extremity edema ABDOMEN: abdomen soft, non-tender and normal bowel sounds Musculoskeletal:no cyanosis of digits and no clubbing  PSYCH: alert & oriented x 3 with fluent speech NEURO: no focal motor/sensory deficits SKIN:  no rashes  or significant lesions  LABORATORY DATA:  I have reviewed the data as listed Lab Results  Component Value Date   WBC 8.3 10/05/2016   HGB 11.3 (L) 10/05/2016   HCT 33.3 (L) 10/05/2016  MCV 85.4 10/05/2016   PLT 216 10/05/2016    Recent Labs  09/14/16 0754  09/18/16 1539 09/20/16 0822 10/05/16 1420  NA 138  < > 139 139 137  K 2.6*  < > 4.0 3.6 3.0*  CL 98*  < > 106 104 101  CO2 31  < > 25 28 28   GLUCOSE 139*  < > 210* 136* 116*  BUN 6  < > 9 18 13   CREATININE 0.43*  < > 0.70 0.46 0.44  CALCIUM 9.2  < > 9.4 9.1 8.9  GFRNONAA >60  < > >60 >60 >60  GFRAA >60  < > >60 >60 >60  PROT 7.0  --   --  6.2* 6.5  ALBUMIN 3.6  --   --  3.4* 3.7  AST 20  --   --  14* 19  ALT 19  --   --  14 18  ALKPHOS 63  --   --  41 49  BILITOT 0.5  --   --  0.6 0.6  < > = values in this interval not displayed.  RADIOGRAPHIC STUDIES: I have personally reviewed the radiological images as listed and agreed with the findings in the report. Dg Fluoro Guided Loc Of Needle/cath Tip For Spinal Inject Lt  Result Date: 09/14/2016 CLINICAL DATA:  Lymphoma. EXAM: DIAGNOSTIC LUMBAR PUNCTURE UNDER FLUOROSCOPIC GUIDANCE FLUOROSCOPY TIME:  Fluoroscopy Time:  2 minutes 30 seconds. Radiation Exposure Index (if provided by the fluoroscopic device): 65.2 mGy Number of Acquired Spot Images: 1 PROCEDURE: After discussing this procedure with the patient informed consent was obtained . Patient was sterilely prepped and draped. 1% lidocaine used for local anesthesia . 22 gauge spinal needle was advanced into the spinal canal at the level of L3-L4. Opening pressure of approximately 9 cm noted. Clear CSF was obtained. A total of 10 cc obtained. IMPRESSION: Successful fluoroscopically directed lumbar puncture. Electronically Signed   By: Marcello Moores  Register   On: 09/14/2016 10:37    ASSESSMENT & PLAN:  DLBCL (diffuse large B cell lymphoma) ([place]) 71 year old female patient with history of triple hit diffuse large B-cell lymphoma  status post cycle S/p  #1 of DA-EPOCH appx 3 week ago [also, s/p #1 cycle of R-CHOP] chemotherapy.  # Proceed with chemotherapy cycle #2  Today. Doses have been adjusted according to the patient's blood count nadir. [Doxorubicin; etoposide; vincristine increased by 20%]. Burnis Medin repeat a PET scan after 3 cycles.   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 On neurontin.   # Constipation from chemotherapy. Recommend MiraLAX.  # Hypokalemia- K- 3.0; repeat labs tomorrow.   # DVT prophylaxis.     Cammie Sickle, MD 10/08/2016 5:28 PM

## 2016-10-09 ENCOUNTER — Ambulatory Visit: Payer: Medicare Other

## 2016-10-09 ENCOUNTER — Ambulatory Visit: Payer: Medicare Other | Admitting: Internal Medicine

## 2016-10-09 LAB — BASIC METABOLIC PANEL
ANION GAP: 5 (ref 5–15)
BUN: 10 mg/dL (ref 6–20)
CO2: 27 mmol/L (ref 22–32)
Calcium: 8.7 mg/dL — ABNORMAL LOW (ref 8.9–10.3)
Chloride: 109 mmol/L (ref 101–111)
Creatinine, Ser: 0.4 mg/dL — ABNORMAL LOW (ref 0.44–1.00)
GLUCOSE: 195 mg/dL — AB (ref 65–99)
POTASSIUM: 3.9 mmol/L (ref 3.5–5.1)
Sodium: 141 mmol/L (ref 135–145)

## 2016-10-09 MED ORDER — SODIUM CHLORIDE 0.9 % IV SOLN
Freq: Once | INTRAVENOUS | Status: AC
  Administered 2016-10-09: 8 mg via INTRAVENOUS
  Filled 2016-10-09: qty 4

## 2016-10-09 MED ORDER — VINCRISTINE SULFATE CHEMO INJECTION 1 MG/ML
Freq: Once | INTRAVENOUS | Status: AC
  Administered 2016-10-09: 15:00:00 via INTRAVENOUS
  Filled 2016-10-09: qty 11

## 2016-10-09 NOTE — Progress Notes (Signed)
Hematology/Oncology Consult note [facility]  [phone] [phone]  Patient Care Team: Rusty Aus, [name]   Name of the patient: Rose Hill  [id number]  [birth year]   Date of visit: @TODAY @  Diagnosis- Triple hit DLBCL admitted for cycle # 2 of DA [facility]    Interval history- tolerating chemo well. Does have tingling numbness in her fingers and feet unchanged from prior cycle. Denies pain or other symptoms  ECOG PS- 1 Pain scale- 0 Opioid associated constipation- no  Review of systems- Review of Systems  Constitutional: Positive for malaise/fatigue. Negative for chills, fever and weight loss.  HENT: Negative for congestion, ear discharge and nosebleeds.   Eyes: Negative for blurred vision.  Respiratory: Negative for cough, hemoptysis, sputum production, shortness of breath and wheezing.   Cardiovascular: Negative for chest pain, palpitations, orthopnea and claudication.  Gastrointestinal: Negative for abdominal pain, blood in stool, constipation, diarrhea, heartburn, melena, nausea and vomiting.  Genitourinary: Negative for dysuria, flank pain, frequency, hematuria and urgency.  Musculoskeletal: Negative for back pain, joint pain and myalgias.  Skin: Negative for rash.  Neurological: Positive for tingling. Negative for dizziness, focal weakness, seizures, weakness and headaches.  Endo/Heme/Allergies: Does not bruise/bleed easily.  Psychiatric/Behavioral: Negative for depression and suicidal ideas. The patient does not have insomnia.       Allergies  Allergen Reactions  . Cefuroxime Axetil Diarrhea    Other reaction(s): Abdominal Pain  . Doxycycline     Other reaction(s): Abdominal Pain severe     Past Medical History:  Diagnosis Date  . Diabetes mellitus without complication ([place])   . History of gastric ulcer   . Hypercholesterolemia   . Hypertension   . ILD (interstitial lung  disease) ([place])    8 yrs ago  . Lymphadenopathy      Past Surgical History:  Procedure Laterality Date  . BREAST BIOPSY Left 2010   neg  . HERNIA REPAIR    . PARTIAL HYSTERECTOMY     age 69  . PERIPHERAL VASCULAR CATHETERIZATION N/A 08/22/2016   Procedure: [name];  Surgeon: [name], MD;  Location: [facility];  Service: Cardiovascular;  Laterality: N/A;  . TONSILLECTOMY      Social History   Social History  . Marital status: Married    Spouse name: Chanette Kill  . Number of children: 2  . Years of education: N/A   Occupational History  . Retired    Social History Main Topics  . Smoking status: Former Smoker    Quit date: 08/21/1958  . Smokeless tobacco: Never Used  . Alcohol use No  . Drug use: No  . Sexual activity: No   Other Topics Concern  . Not on file   Social History Narrative   Not applicable at this time    Family History  Problem Relation Age of Onset  . Heart disease Mother   . Heart disease Father   . Heart disease Brother      Current Facility-Administered Medications:  .  0.9 %  sodium chloride infusion, , Intravenous, Continuous, Cammie Sickle, MD, Last Rate: 20 mL/hr at 10/09/16 [id number] .  acetaminophen (TYLENOL) tablet 650 mg, 650 mg, Oral, Q4H PRN, Cammie Sickle, MD .  allopurinol (ZYLOPRIM) tablet 300 mg, 300 mg, Oral, Daily, Cammie Sickle, MD, 300 mg at 10/09/16 0950 .  alum & mag hydroxide-simeth (MAALOX/MYLANTA) 200-200-20 MG/5ML suspension 60 mL, 60 mL,  Oral, Q4H PRN, Cammie Sickle, MD .  B-complex with vitamin C tablet 1 tablet, 1 tablet, Oral, Daily, Cammie Sickle, MD, 1 tablet at 10/09/16 0950 .  DOXOrubicin (ADRIAMYCIN) 22 mg, etoposide (VEPESID) 106 mg, vinCRIStine (ONCOVIN) 0.8 mg in sodium chloride 0.9 % 500 mL chemo infusion, , Intravenous, Once, Cammie Sickle, MD, Last Rate: 24 mL/hr at 10/09/16 0611 .  DOXOrubicin (ADRIAMYCIN) 22 mg, etoposide (VEPESID) 106  mg, vinCRIStine (ONCOVIN) 0.8 mg in sodium chloride 0.9 % 500 mL chemo infusion, , Intravenous, Once, Cammie Sickle, MD .  enoxaparin (LOVENOX) injection 40 mg, 40 mg, Subcutaneous, Q24H, Cammie Sickle, MD .  gabapentin (NEURONTIN) capsule 100 mg, 100 mg, Oral, TID, Cammie Sickle, MD, 100 mg at 10/09/16 0950 .  guaiFENesin-dextromethorphan (ROBITUSSIN DM) 100-10 MG/5ML syrup 10 mL, 10 mL, Oral, Q4H PRN, Cammie Sickle, MD .  losartan (COZAAR) tablet 100 mg, 100 mg, Oral, Daily, 100 mg at 10/09/16 0950 **AND** hydrochlorothiazide (MICROZIDE) capsule 12.5 mg, 12.5 mg, Oral, Daily, Cammie Sickle, MD, 12.5 mg at 10/09/16 0950 .  hydrocortisone (ANUSOL-HC) 2.5 % rectal cream 1 application, 1 application, Rectal, BID PRN, Cammie Sickle, MD .  latanoprost (XALATAN) 0.005 % ophthalmic solution 1 drop, 1 drop, Both Eyes, QHS, Cammie Sickle, MD, 1 drop at 10/08/16 2036 .  magnesium oxide (MAG-OX) tablet 400 mg, 400 mg, Oral, Daily, Cammie Sickle, MD, 400 mg at 10/09/16 0950 .  metFORMIN (GLUCOPHAGE) tablet 500 mg, 500 mg, Oral, Q breakfast, Cammie Sickle, MD, 500 mg at 10/09/16 0950 .  ondansetron (ZOFRAN) tablet 4-8 mg, 4-8 mg, Oral, Q8H PRN **OR** ondansetron (ZOFRAN-ODT) disintegrating tablet 4-8 mg, 4-8 mg, Oral, Q8H PRN **OR** ondansetron (ZOFRAN) injection 4 mg, 4 mg, Intravenous, Q8H PRN **OR** ondansetron (ZOFRAN) 8 mg in sodium chloride 0.9 % 50 mL IVPB, 8 mg, Intravenous, Q8H PRN, Cammie Sickle, MD .  ondansetron (ZOFRAN) 8 mg, dexamethasone (DECADRON) 10 mg in sodium chloride 0.9 % 50 mL IVPB, , Intravenous, Once, Cammie Sickle, MD .  ondansetron (ZOFRAN) tablet 8 mg, 8 mg, Oral, Q8H PRN, Cammie Sickle, MD .  oxyCODONE (Oxy IR/ROXICODONE) immediate release tablet 2.5 mg, 2.5 mg, Oral, Q6H PRN, Cammie Sickle, MD .  pantoprazole (PROTONIX) EC tablet 40 mg, 40 mg, Oral, BID, Cammie Sickle, MD, 40 mg at  10/09/16 0950 .  pravastatin (PRAVACHOL) tablet 40 mg, 40 mg, Oral, q1800, Cammie Sickle, MD, 40 mg at 10/08/16 1739 .  predniSONE (DELTASONE) tablet 105 mg, 105 mg, Oral, BID WC, Cammie Sickle, MD, 105 mg at 10/09/16 0950 .  prochlorperazine (COMPAZINE) tablet 10 mg, 10 mg, Oral, Q6H PRN, Cammie Sickle, MD .  senna-docusate (Senokot-S) tablet 1 tablet, 1 tablet, Oral, QHS PRN, Cammie Sickle, MD  Physical exam:  Vitals:   10/08/16 1031 10/08/16 1258 10/08/16 2028 10/09/16 0511  BP:  (!) 108/56 (!) 103/56 (!) 108/50  Pulse:  80 84 69  Resp:  18 (!) 22 (!) 22  Temp:  98.1 F (36.7 C) 97.8 F (36.6 C) 97.8 F (36.6 C)  TempSrc:  Oral Oral Oral  SpO2:  98% 99% 97%  Weight: 140 lb (63.5 kg)     Height: 5\' 7"  (1.702 m)      Physical Exam  Constitutional: She is oriented to person, place, and time and well-developed, well-nourished, and in no distress.  HENT:  Head: Normocephalic and atraumatic.  Eyes: EOM are normal. Pupils  are equal, round, and reactive to light.  Neck: Normal range of motion.  Cardiovascular: Normal rate, regular rhythm and normal heart sounds.   Pulmonary/Chest: Effort normal and breath sounds normal.  Abdominal: Soft. Bowel sounds are normal.  Neurological: She is alert and oriented to person, place, and time.  Skin: Skin is warm and dry.     CMP Latest Ref Rng & Units 10/09/2016  Glucose 65 - 99 mg/dL 195(H)  BUN 6 - 20 mg/dL 10  Creatinine 0.44 - 1.00 mg/dL 0.40(L)  Sodium 135 - 145 mmol/L 141  Potassium 3.5 - 5.1 mmol/L 3.9  Chloride 101 - 111 mmol/L 109  CO2 22 - 32 mmol/L 27  Calcium 8.9 - 10.3 mg/dL 8.7(L)  Total Protein 6.5 - 8.1 g/dL -  Total Bilirubin 0.3 - 1.2 mg/dL -  Alkaline Phos 38 - 126 U/L -  AST 15 - 41 U/L -  ALT 14 - 54 U/L -   CBC Latest Ref Rng & Units 10/05/2016  WBC 3.6 - 11.0 K/uL 8.3  Hemoglobin 12.0 - 16.0 g/dL 11.3(L)  Hematocrit 35.0 - 47.0 % 33.3(L)  Platelets 150 - 440 K/uL 216      Dg  Fluoro Guided Loc Of Needle/cath Tip For Spinal Inject Lt  Result Date: 09/14/2016 CLINICAL DATA:  Lymphoma. EXAM: DIAGNOSTIC LUMBAR PUNCTURE UNDER FLUOROSCOPIC GUIDANCE FLUOROSCOPY TIME:  Fluoroscopy Time:  2 minutes 30 seconds. Radiation Exposure Index (if provided by the fluoroscopic device): 65.2 mGy Number of Acquired Spot Images: 1 PROCEDURE: After discussing this procedure with the patient informed consent was obtained . Patient was sterilely prepped and draped. 1% lidocaine used for local anesthesia . 22 gauge spinal needle was advanced into the spinal canal at the level of L3-L4. Opening pressure of approximately 9 cm noted. Clear CSF was obtained. A total of 10 cc obtained. IMPRESSION: Successful fluoroscopically directed lumbar puncture. Electronically Signed   By: Marcello Moores  Register   On: 09/14/2016 10:37     Assessment and plan- Patient is a 71 y.o. female admitted for cycle # 2 of DA [place] for triple hit DLBCL stage II  1. Day 2 of chemotherapy well. 20% dose increase as compared to prior chemo dose based on ANC. Tolerating well so far  2. 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 Hypokalemia- resolved  Dr. Rogue Bussing to see patient tomorrow   Visit Diagnosis 1. PULMONARY FIBROSIS   2. Diffuse large B-cell lymphoma of intra-abdominal lymph nodes (HCC)   3. Abdominal mass of other site   4. Retroperitoneal lymphadenopathy   5. Diffuse large B-cell lymphoma, unspecified body region ([place])   6. Protein-calorie malnutrition, severe      Dr. Randa Evens, MD, MPH [facility] at [facility] Pager- [phone] 10/09/2016 12:49 PM

## 2016-10-09 NOTE — Progress Notes (Addendum)
Inpatient Diabetes Program Recommendations  AACE/ADA: New Consensus Statement on Inpatient Glycemic Control (2015)  Target Ranges:  Prepandial:   less than 140 mg/dL      Peak postprandial:   less than 180 mg/dL (1-2 hours)      Critically ill patients:  140 - 180 mg/dL   Lab Results  Component Value Date   GLUCAP 136 (H) 09/14/2016    Review of Glycemic Control  Diabetes history: Type 2 Outpatient Diabetes medications: Metformin 500mg  qam Current orders for Inpatient glycemic control: Metformin 500mg  qam  Inpatient Diabetes Program Recommendations:  consider order CBG tid, hs while patient is inpatient  Please change diet to heart healthy/carb modified.   Gentry Fitz, RN, BA, MHA, CDE Diabetes Coordinator Inpatient Diabetes Program  [phone] (Team Pager) [phone] ([place]) 10/09/2016 2:49 PM

## 2016-10-10 ENCOUNTER — Inpatient Hospital Stay: Payer: Medicare Other

## 2016-10-10 MED ORDER — SODIUM CHLORIDE 0.9 % IV SOLN
Freq: Once | INTRAVENOUS | Status: AC
  Administered 2016-10-10: 16:00:00 8 mg via INTRAVENOUS
  Filled 2016-10-10: qty 4

## 2016-10-10 MED ORDER — VINCRISTINE SULFATE CHEMO INJECTION 1 MG/ML
Freq: Once | INTRAVENOUS | Status: AC
  Administered 2016-10-10: 16:00:00 via INTRAVENOUS
  Filled 2016-10-10: qty 11

## 2016-10-10 NOTE — Care Management Important Message (Signed)
Important Message  Patient Details  Name: AASHNA MACHA MRN: [id number] Date of Birth: 01-08-1946   Medicare Important Message Given:  Yes    Shelbie Ammons, RN 10/10/2016, 9:31 AM

## 2016-10-10 NOTE — Care Management (Signed)
Admitted to [facility] with the diagnosis of Large B cell lymphoma. Lives with husband, Edd Arbour  [phone]). Last seen Dr. Emily Filbert in January 2018. Prescriptions are filled at [facility] on [address]. Takes care of ll basic and instrumental activities of daily living herself, can drive, if needed. No home health, No skilled facility. No home oxygen. No equipment in the home. No falls. Decreased appetite for the last few months. Lost 20 pounds. Retired from the [employer]. Worked in [employer].  Husband will transport. Shelbie Ammons RN MSN CCM Care Management

## 2016-10-10 NOTE — Progress Notes (Signed)
Rose Hill   DOB:02/16/1946   [id number]    Subjective: No nausea no vomiting. Appetite good. Mild tingling and numbness in the extremities.   ROS: No chest pain or shortness of breath or cough. No swelling in legs or facial redness or skin rash.  Objective:  Vitals:   10/09/16 2007 10/10/16 0409  BP: (!) 110/52 (!) 128/56  Pulse: 66 61  Resp: (!) 22 (!) 22  Temp: 97.9 F (36.6 C) 97.6 F (36.4 C)     Intake/Output Summary (Last 24 hours) at 10/10/16 [id number] Last data filed at 10/10/16 [id number]  Gross per 24 hour  Intake              985 ml  Output                0 ml  Net              985 ml    GENERAL: Well-nourished well-developed; Alert, no distress and comfortable.  She is accompanied by her husband.  EYES: no pallor or icterus OROPHARYNX: no thrush or ulceration. NECK: supple, no masses felt LYMPH:  no palpable lymphadenopathy in the cervical, axillary or inguinal regions LUNGS: decreased breath sounds to auscultation at bases and  No wheeze or crackles HEART/CVS: regular rate & rhythm and no murmurs; No lower extremity edema ABDOMEN: abdomen soft, non-tender and normal bowel sounds Musculoskeletal:no cyanosis of digits and no clubbing  PSYCH: alert & oriented x 3 with fluent speech NEURO: no focal motor/sensory deficits SKIN:  no rashes or significant lesions   Labs:  Lab Results  Component Value Date   WBC 8.3 10/05/2016   HGB 11.3 (L) 10/05/2016   HCT 33.3 (L) 10/05/2016   MCV 85.4 10/05/2016   PLT 216 10/05/2016   NEUTROABS 6.1 10/05/2016    Lab Results  Component Value Date   NA 141 10/09/2016   K 3.9 10/09/2016   CL 109 10/09/2016   CO2 27 10/09/2016    Studies:  No results found.  Assessment & Plan:  71 year old female patient with history of triple hit diffuse large B-cell lymphoma status post cycle currently on cycle #2  of DA- [place].  # Proceed with day #3 today.  We'll repeat a PET scan after 3 cycles.   # PN G-1- likely from  vincristine. On neurontin.   # Constipation from chemotherapy. Recommend MiraLAX.  # Hypokalemia/resolved yesterday- K- 3.9.   # DVT prophylaxis.   # Will likely plan discharge on 2/23 p.m. post chemotherapy.  Cammie Sickle, MD 10/10/2016  8:23 AM

## 2016-10-11 ENCOUNTER — Ambulatory Visit: Payer: Medicare Other

## 2016-10-11 MED ORDER — VINCRISTINE SULFATE CHEMO INJECTION 1 MG/ML
Freq: Once | INTRAVENOUS | Status: AC
  Administered 2016-10-11: 17:00:00 via INTRAVENOUS
  Filled 2016-10-11: qty 11

## 2016-10-11 MED ORDER — SODIUM CHLORIDE 0.9 % IV SOLN
Freq: Once | INTRAVENOUS | Status: AC
  Administered 2016-10-11: 8 mg via INTRAVENOUS
  Filled 2016-10-11: qty 4

## 2016-10-11 NOTE — Progress Notes (Signed)
Rose Hill   DOB:May 25, 1946   [id number]    Subjective: No new symptoms from yesterday. No nausea no vomiting. Appetite good. Mild tingling and numbness in the extremities. Started "chemo bag" yesterday at 5:30 pm - as per patient.   ROS: No chest pain or shortness of breath or cough. No skin rash.  Objective:  Vitals:   10/10/16 1350 10/10/16 2023  BP: (!) 119/56 (!) 114/50  Pulse: 66 69  Resp:  18  Temp: 97.9 F (36.6 C) 97.9 F (36.6 C)     Intake/Output Summary (Last 24 hours) at 10/11/16 0810 Last data filed at 10/11/16 0400  Gross per 24 hour  Intake          1561.73 ml  Output                0 ml  Net          1561.73 ml    GENERAL: Well-nourished well-developed; Alert, no distress and comfortable.  She is accompanied by her husband.  EYES: no pallor or icterus OROPHARYNX: no thrush or ulceration. NECK: supple, no masses felt LYMPH:  no palpable lymphadenopathy in the cervical, axillary or inguinal regions LUNGS: decreased breath sounds to auscultation at bases and  No wheeze or crackles HEART/CVS: regular rate & rhythm and no murmurs; No lower extremity edema ABDOMEN: abdomen soft, non-tender and normal bowel sounds Musculoskeletal:no cyanosis of digits and no clubbing  PSYCH: alert & oriented x 3 with fluent speech NEURO: no focal motor/sensory deficits SKIN:  no rashes or significant lesions   Labs:  Lab Results  Component Value Date   WBC 8.3 10/05/2016   HGB 11.3 (L) 10/05/2016   HCT 33.3 (L) 10/05/2016   MCV 85.4 10/05/2016   PLT 216 10/05/2016   NEUTROABS 6.1 10/05/2016    Lab Results  Component Value Date   NA 141 10/09/2016   K 3.9 10/09/2016   CL 109 10/09/2016   CO2 27 10/09/2016    Studies:  No results found.  Assessment & Plan:  71 year old female patient with history of triple hit diffuse large B-cell lymphoma status post cycle currently on cycle #2  of DA- [place].  # Proceed with day #4 today.  We'll repeat a PET scan after 3  cycles.   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 Stable.  On neurontin.   # Constipation from chemotherapy; improved; continue MiraLAX prn.   # DVT prophylaxis.   # Will likely plan discharge on 2/23 p.m. post chemotherapy. Discussed with patient/ husband in detail.   Cammie Sickle, MD 10/11/2016  8:10 AM

## 2016-10-12 ENCOUNTER — Ambulatory Visit: Payer: Medicare Other

## 2016-10-12 ENCOUNTER — Other Ambulatory Visit: Payer: Self-pay | Admitting: *Deleted

## 2016-10-12 LAB — CBC
HCT: 29.5 % — ABNORMAL LOW (ref 35.0–47.0)
Hemoglobin: 10.4 g/dL — ABNORMAL LOW (ref 12.0–16.0)
MCH: 30.3 pg (ref 26.0–34.0)
MCHC: 35.2 g/dL (ref 32.0–36.0)
MCV: 86.2 fL (ref 80.0–100.0)
PLATELETS: 199 10*3/uL (ref 150–440)
RBC: 3.42 MIL/uL — ABNORMAL LOW (ref 3.80–5.20)
RDW: 19.3 % — AB (ref 11.5–14.5)
WBC: 4 10*3/uL (ref 3.6–11.0)

## 2016-10-12 LAB — CREATININE, SERUM
CREATININE: 0.43 mg/dL — AB (ref 0.44–1.00)
GFR calc non Af Amer: >60 mL/min (ref >60)

## 2016-10-12 MED ORDER — HEPARIN SOD (PORK) LOCK FLUSH 100 UNIT/ML IV SOLN
500.0000 [IU] | Freq: Once | INTRAVENOUS | Status: DC
  Administered 2016-10-12: 18:00:00 500 [IU] via INTRAVENOUS

## 2016-10-12 MED ORDER — PEGFILGRASTIM 6 MG/0.6ML ~~LOC~~ PSKT
6.0000 mg | PREFILLED_SYRINGE | Freq: Once | SUBCUTANEOUS | Status: AC
  Administered 2016-10-12: 6 mg via SUBCUTANEOUS
  Filled 2016-10-12 (×2): qty 0.6

## 2016-10-12 MED ORDER — DIPHENHYDRAMINE HCL 25 MG PO CAPS
25.0000 mg | ORAL_CAPSULE | Freq: Three times a day (TID) | ORAL | Status: DC | PRN
Start: 2016-10-12 — End: 2016-10-12
  Administered 2016-10-12: 25 mg via ORAL
  Filled 2016-10-12: qty 1

## 2016-10-12 MED ORDER — SODIUM CHLORIDE 0.9 % IV SOLN
Freq: Once | INTRAVENOUS | Status: DC
  Filled 2016-10-12: qty 8

## 2016-10-12 MED ORDER — SODIUM CHLORIDE 0.9 % IV SOLN
1400.0000 mg | Freq: Once | INTRAVENOUS | Status: AC
  Administered 2016-10-12: 16:00:00 1400 mg via INTRAVENOUS
  Filled 2016-10-12: qty 50

## 2016-10-12 MED ORDER — HEPARIN SOD (PORK) LOCK FLUSH 100 UNIT/ML IV SOLN
500.0000 [IU] | Freq: Once | INTRAVENOUS | Status: DC
  Filled 2016-10-12: qty 5

## 2016-10-12 NOTE — Discharge Summary (Signed)
[facility] [place] [facility] NAME: Rose [facility]    MR#:  [id number]  DATE OF BIRTH:  [birth year]  DATE OF ADMISSION:  10/08/2016 ADMITTING PHYSICIAN: Cammie Sickle, MD  DATE OF DISCHARGE: No discharge date for patient encounter.  PRIMARY CARE PHYSICIAN: Rusty Aus, MD    ADMISSION DIAGNOSIS:  lymphoma  DISCHARGE DIAGNOSIS:  Active Problems:   DLBCL (diffuse large B cell lymphoma) (HCC)   SECONDARY DIAGNOSIS:   Past Medical History:  Diagnosis Date  . Diabetes mellitus without complication ([place])   . History of gastric ulcer   . Hypercholesterolemia   . Hypertension   . ILD (interstitial lung disease) ([place])    8 yrs ago  . 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 She tolerated chemotherapy well. She received onpro on February 23rd/ after receiving Cytoxan day #5.  On the day of discharge patient did not have any new symptoms except for mild flushing of the face. Prescribed Benadryl.   DISCHARGE CONDITIONS:   STABLE  CONSULTS OBTAINED:    NONE DRUG ALLERGIES:   Allergies  Allergen Reactions  . Cefuroxime Axetil Diarrhea    Other reaction(s): Abdominal Pain  . Doxycycline     Other reaction(s): Abdominal Pain severe    DISCHARGE MEDICATIONS:   Current Discharge Medication List    CONTINUE these medications which have NOT CHANGED   Details  allopurinol (ZYLOPRIM) 300 MG tablet Take 1 tablet (300 mg total) by mouth daily. Qty: 30 tablet, Refills: 0    b complex vitamins tablet Take 1 tablet by mouth daily.    gabapentin (NEURONTIN) 100 MG capsule Take 1 capsule (100 mg total) by mouth 3 (three) times daily. Qty: 90 capsule, Refills: 1    latanoprost (XALATAN) 0.005 % ophthalmic solution Place 1 drop into both eyes at bedtime.    Associated Diagnoses: Retroperitoneal lymphadenopathy     lidocaine-prilocaine (EMLA) cream Apply cream 1 hour before chemotherapy treatment, place small amount of saran wrap over cream to protect clothing. Qty: 30 g, Refills: 1    losartan-hydrochlorothiazide (HYZAAR) 100-12.5 MG tablet Take 1 tablet by mouth daily.   Associated Diagnoses: Retroperitoneal lymphadenopathy    Magnesium 400 MG TABS Take 1 tablet by mouth daily.    metFORMIN (GLUCOPHAGE) 500 MG tablet Take 1 tablet by mouth daily with breakfast.    Associated Diagnoses: Retroperitoneal lymphadenopathy    ondansetron (ZOFRAN) 8 MG tablet Take 1 tablet (8 mg total) by mouth every 8 (eight) hours as needed for nausea or vomiting. Qty: 30 tablet, Refills: 0   Associated Diagnoses: Chemotherapy-induced nausea; Diffuse large B-cell lymphoma of intra-abdominal lymph nodes (HCC)    oxyCODONE (ROXICODONE) 5 MG immediate release tablet Take 0.5 tablets (2.5 mg total) by mouth every 6 (six) hours as needed for breakthrough pain. Qty: 60 tablet, Refills: 0    pantoprazole (PROTONIX) 40 MG tablet Take 1 tablet by mouth 2 (two) times daily.   Associated Diagnoses: Retroperitoneal lymphadenopathy    pravastatin (PRAVACHOL) 40 MG tablet Take 1 tablet by mouth daily.   Associated Diagnoses: Retroperitoneal lymphadenopathy    prochlorperazine (COMPAZINE) 10 MG tablet Take 1 tablet (10 mg total) by mouth every 6 (six) hours as needed for nausea, vomiting or refractory nausea / vomiting. Qty: 30 tablet, Refills: 0   Associated Diagnoses: Chemotherapy-induced nausea; Diffuse large B-cell lymphoma of  intra-abdominal lymph nodes (HCC)         DISCHARGE INSTRUCTIONS:    DIET:  Regular diet  DISCHARGE CONDITION:  Good  ACTIVITY:  Activity as tolerated  OXYGEN:  Home Oxygen: Yes.     Oxygen Delivery: room air  DISCHARGE LOCATION:  home   If you experience worsening of your admission symptoms, develop shortness of breath, life threatening emergency, suicidal or homicidal thoughts  you must seek medical attention immediately by calling 911 or calling your MD immediately  if symptoms less severe.  You Must read complete instructions/literature along with all the possible adverse reactions/side effects for all the Medicines you take and that have been prescribed to you. Take any new Medicines after you have completely understood and accpet all the possible adverse reactions/side effects.      On the day of Discharge:  VITAL SIGNS:  Blood pressure (!) 139/55, pulse 63, temperature 98.1 F (36.7 C), temperature source Oral, resp. rate 20, height 5\' 7"  (1.702 m), weight 140 lb (63.5 kg), SpO2 98 %.  PHYSICAL EXAMINATION:  GENERAL:  70 y.o.-year-old patient lying in the bed with no acute distress.  EYES: Pupils equal, round, reactive to light and accommodation. No scleral icterus. Extraocular muscles intact.  HEENT: Head atraumatic, normocephalic. Oropharynx and nasopharynx clear.  NECK:  Supple, no jugular venous distention. No thyroid enlargement, no tenderness.  LUNGS: Normal breath sounds bilaterally, no wheezing, rales,rhonchi or crepitation. No use of accessory muscles of respiration.  CARDIOVASCULAR: S1, S2 normal. No murmurs, rubs, or gallops.  ABDOMEN: Soft, non-tender, non-distended. Bowel sounds present. No organomegaly or mass.  EXTREMITIES: No pedal edema, cyanosis, or clubbing.  NEUROLOGIC: Cranial nerves II through XII are intact. Muscle strength 5/5 in all extremities. Sensation intact. Gait not checked.  PSYCHIATRIC: The patient is alert and oriented x 3.  SKIN: mild flushing of the face.   DATA REVIEW:   CBC  Recent Labs Lab 10/12/16 0514  WBC 4.0  HGB 10.4*  HCT 29.5*  PLT 199    Chemistries   Recent Labs Lab 10/09/16 0434 10/12/16 0514  NA 141  --   K 3.9  --   CL 109  --   CO2 27  --   GLUCOSE 195*  --   BUN 10  --   CREATININE 0.40* 0.43*  CALCIUM 8.7*  --     Cardiac Enzymes No results for input(s): TROPONINI in the last  168 hours.  Microbiology Results  Results for orders placed or performed during the hospital encounter of 08/16/16  Urine culture     Status: Abnormal   Collection Time: 08/16/16 10:03 AM  Result Value Ref Range Status   Specimen Description URINE, RANDOM  Final   Special Requests NONE  Final   Culture (A)  Final    60,000 COLONIES/mL GROUP B STREP(S.AGALACTIAE)ISOLATED TESTING AGAINST S. AGALACTIAE NOT ROUTINELY PERFORMED DUE TO PREDICTABILITY OF AMP/PEN/VAN SUSCEPTIBILITY. Performed at [facility]    Report Status 08/17/2016 FINAL  Final    RADIOLOGY:  No results found.   Management plans discussed with the patient, family and they are in agreement.  CODE STATUS:     Code Status Orders        Start     Ordered   10/08/16 1356  Full code  Continuous     10/08/16 1358    Code Status History    Date Active Date Inactive Code Status Order ID Comments User Context   09/17/2016  2:42 PM  09/22/2016  2:19 PM Full Code [id number]  Cammie Sickle, MD Inpatient      TOTAL TIME TAKING CARE OF THIS PATIENT: 52 mins   Cammie Sickle M.D on 10/12/2016 at 3:34 PM    CC: Primary care physician; Rusty Aus, MD   Note: This dictation was prepared with Dragon dictation along with smaller phrase technology. Any transcriptional errors that result from this process are unintentional.

## 2016-10-12 NOTE — Care Management Important Message (Signed)
Important Message  Patient Details  Name: Rose Hill MRN: [id number] Date of Birth: [birth year]   Medicare Important Message Given:  Yes    Shelbie Ammons, RN 10/12/2016, 11:57 AM

## 2016-10-15 ENCOUNTER — Other Ambulatory Visit: Payer: Self-pay | Admitting: *Deleted

## 2016-10-15 ENCOUNTER — Inpatient Hospital Stay: Payer: Medicare Other

## 2016-10-15 ENCOUNTER — Other Ambulatory Visit: Payer: Self-pay | Admitting: Internal Medicine

## 2016-10-15 DIAGNOSIS — C8333 Diffuse large B-cell lymphoma, intra-abdominal lymph nodes: Secondary | ICD-10-CM

## 2016-10-15 DIAGNOSIS — K121 Other forms of stomatitis: Secondary | ICD-10-CM | POA: Diagnosis not present

## 2016-10-15 DIAGNOSIS — R591 Generalized enlarged lymph nodes: Secondary | ICD-10-CM | POA: Diagnosis not present

## 2016-10-15 DIAGNOSIS — G629 Polyneuropathy, unspecified: Secondary | ICD-10-CM | POA: Diagnosis not present

## 2016-10-15 DIAGNOSIS — I10 Essential (primary) hypertension: Secondary | ICD-10-CM | POA: Diagnosis not present

## 2016-10-15 DIAGNOSIS — Z79899 Other long term (current) drug therapy: Secondary | ICD-10-CM | POA: Diagnosis not present

## 2016-10-15 DIAGNOSIS — K5900 Constipation, unspecified: Secondary | ICD-10-CM | POA: Diagnosis not present

## 2016-10-15 DIAGNOSIS — Z8711 Personal history of peptic ulcer disease: Secondary | ICD-10-CM | POA: Diagnosis not present

## 2016-10-15 DIAGNOSIS — E119 Type 2 diabetes mellitus without complications: Secondary | ICD-10-CM | POA: Diagnosis not present

## 2016-10-15 DIAGNOSIS — Z9221 Personal history of antineoplastic chemotherapy: Secondary | ICD-10-CM | POA: Diagnosis not present

## 2016-10-15 DIAGNOSIS — E7800 Pure hypercholesterolemia, unspecified: Secondary | ICD-10-CM | POA: Diagnosis not present

## 2016-10-15 DIAGNOSIS — R110 Nausea: Secondary | ICD-10-CM

## 2016-10-15 DIAGNOSIS — Z87891 Personal history of nicotine dependence: Secondary | ICD-10-CM | POA: Diagnosis not present

## 2016-10-15 DIAGNOSIS — T451X5A Adverse effect of antineoplastic and immunosuppressive drugs, initial encounter: Principal | ICD-10-CM

## 2016-10-15 DIAGNOSIS — Z7984 Long term (current) use of oral hypoglycemic drugs: Secondary | ICD-10-CM | POA: Diagnosis not present

## 2016-10-15 LAB — CBC WITH DIFFERENTIAL/PLATELET
BASOS ABS: 0.1 10*3/uL (ref 0–0.1)
BASOS PCT: 0 %
Eosinophils Absolute: 0.1 10*3/uL (ref 0–0.7)
Eosinophils Relative: 0 %
HCT: 35.4 % (ref 35.0–47.0)
Hemoglobin: 11.9 g/dL — ABNORMAL LOW (ref 12.0–16.0)
LYMPHS PCT: 1 %
Lymphs Abs: 0.4 10*3/uL — ABNORMAL LOW (ref 1.0–3.6)
MCH: 29.2 pg (ref 26.0–34.0)
MCHC: 33.6 g/dL (ref 32.0–36.0)
MCV: 86.7 fL (ref 80.0–100.0)
Monocytes Absolute: 0.1 10*3/uL — ABNORMAL LOW (ref 0.2–0.9)
Monocytes Relative: 0 %
NEUTROS PCT: 99 %
Neutro Abs: 31.1 10*3/uL — ABNORMAL HIGH (ref 1.4–6.5)
Platelets: 207 10*3/uL (ref 150–440)
RBC: 4.08 MIL/uL (ref 3.80–5.20)
RDW: 20.1 % — ABNORMAL HIGH (ref 11.5–14.5)
WBC: 31.8 10*3/uL — ABNORMAL HIGH (ref 3.6–11.0)

## 2016-10-17 ENCOUNTER — Inpatient Hospital Stay: Payer: Medicare Other

## 2016-10-17 ENCOUNTER — Ambulatory Visit: Payer: Medicare Other | Admitting: Internal Medicine

## 2016-10-17 DIAGNOSIS — C8333 Diffuse large B-cell lymphoma, intra-abdominal lymph nodes: Secondary | ICD-10-CM | POA: Diagnosis not present

## 2016-10-17 LAB — CBC WITH DIFFERENTIAL/PLATELET
BASOS PCT: 1 %
Basophils Absolute: 0 10*3/uL (ref 0–0.1)
EOS PCT: 4 %
Eosinophils Absolute: 0.1 10*3/uL (ref 0–0.7)
HEMATOCRIT: 30.4 % — AB (ref 35.0–47.0)
HEMOGLOBIN: 10.5 g/dL — AB (ref 12.0–16.0)
Lymphocytes Relative: 14 %
Lymphs Abs: 0.3 10*3/uL — ABNORMAL LOW (ref 1.0–3.6)
MCH: 30.3 pg (ref 26.0–34.0)
MCHC: 34.4 g/dL (ref 32.0–36.0)
MCV: 87.9 fL (ref 80.0–100.0)
MONOS PCT: 2 %
Monocytes Absolute: 0 10*3/uL — ABNORMAL LOW (ref 0.2–0.9)
NEUTROS PCT: 79 %
Neutro Abs: 1.6 10*3/uL (ref 1.4–6.5)
Platelets: 115 10*3/uL — ABNORMAL LOW (ref 150–440)
RBC: 3.46 MIL/uL — ABNORMAL LOW (ref 3.80–5.20)
RDW: 19.9 % — ABNORMAL HIGH (ref 11.5–14.5)
WBC: 2 10*3/uL — ABNORMAL LOW (ref 3.6–11.0)

## 2016-10-17 LAB — COMPREHENSIVE METABOLIC PANEL
ALK PHOS: 51 U/L (ref 38–126)
ALT: 16 U/L (ref 14–54)
AST: 20 U/L (ref 15–41)
Albumin: 3.5 g/dL (ref 3.5–5.0)
Anion gap: 8 (ref 5–15)
BILIRUBIN TOTAL: 1 mg/dL (ref 0.3–1.2)
BUN: 19 mg/dL (ref 6–20)
CALCIUM: 8.5 mg/dL — AB (ref 8.9–10.3)
CHLORIDE: 99 mmol/L — AB (ref 101–111)
CO2: 30 mmol/L (ref 22–32)
CREATININE: 0.61 mg/dL (ref 0.44–1.00)
Glucose, Bld: 115 mg/dL — ABNORMAL HIGH (ref 65–99)
Potassium: 3.1 mmol/L — ABNORMAL LOW (ref 3.5–5.1)
Sodium: 137 mmol/L (ref 135–145)
TOTAL PROTEIN: 5.8 g/dL — AB (ref 6.5–8.1)

## 2016-10-17 LAB — LACTATE DEHYDROGENASE: LDH: 137 U/L (ref 98–192)

## 2016-10-18 ENCOUNTER — Ambulatory Visit: Payer: Medicare Other | Admitting: Internal Medicine

## 2016-10-19 ENCOUNTER — Other Ambulatory Visit: Payer: Self-pay | Admitting: Internal Medicine

## 2016-10-19 ENCOUNTER — Inpatient Hospital Stay: Payer: Medicare Other | Attending: Internal Medicine

## 2016-10-19 ENCOUNTER — Telehealth: Payer: Self-pay | Admitting: *Deleted

## 2016-10-19 DIAGNOSIS — C8333 Diffuse large B-cell lymphoma, intra-abdominal lymph nodes: Secondary | ICD-10-CM | POA: Insufficient documentation

## 2016-10-19 DIAGNOSIS — J988 Other specified respiratory disorders: Secondary | ICD-10-CM | POA: Insufficient documentation

## 2016-10-19 DIAGNOSIS — I959 Hypotension, unspecified: Secondary | ICD-10-CM | POA: Diagnosis not present

## 2016-10-19 DIAGNOSIS — E7800 Pure hypercholesterolemia, unspecified: Secondary | ICD-10-CM | POA: Diagnosis not present

## 2016-10-19 DIAGNOSIS — R58 Hemorrhage, not elsewhere classified: Secondary | ICD-10-CM | POA: Insufficient documentation

## 2016-10-19 DIAGNOSIS — Z5112 Encounter for antineoplastic immunotherapy: Secondary | ICD-10-CM | POA: Insufficient documentation

## 2016-10-19 DIAGNOSIS — Z87891 Personal history of nicotine dependence: Secondary | ICD-10-CM | POA: Insufficient documentation

## 2016-10-19 DIAGNOSIS — R5383 Other fatigue: Secondary | ICD-10-CM | POA: Diagnosis not present

## 2016-10-19 DIAGNOSIS — R590 Localized enlarged lymph nodes: Secondary | ICD-10-CM | POA: Diagnosis not present

## 2016-10-19 DIAGNOSIS — J982 Interstitial emphysema: Secondary | ICD-10-CM | POA: Diagnosis not present

## 2016-10-19 DIAGNOSIS — G629 Polyneuropathy, unspecified: Secondary | ICD-10-CM | POA: Diagnosis not present

## 2016-10-19 DIAGNOSIS — K1230 Oral mucositis (ulcerative), unspecified: Secondary | ICD-10-CM | POA: Insufficient documentation

## 2016-10-19 DIAGNOSIS — K121 Other forms of stomatitis: Secondary | ICD-10-CM | POA: Insufficient documentation

## 2016-10-19 DIAGNOSIS — Z8711 Personal history of peptic ulcer disease: Secondary | ICD-10-CM | POA: Diagnosis not present

## 2016-10-19 DIAGNOSIS — R0602 Shortness of breath: Secondary | ICD-10-CM | POA: Diagnosis not present

## 2016-10-19 DIAGNOSIS — Z7984 Long term (current) use of oral hypoglycemic drugs: Secondary | ICD-10-CM | POA: Insufficient documentation

## 2016-10-19 DIAGNOSIS — Z5111 Encounter for antineoplastic chemotherapy: Secondary | ICD-10-CM | POA: Diagnosis not present

## 2016-10-19 DIAGNOSIS — E119 Type 2 diabetes mellitus without complications: Secondary | ICD-10-CM | POA: Insufficient documentation

## 2016-10-19 DIAGNOSIS — R200 Anesthesia of skin: Secondary | ICD-10-CM | POA: Diagnosis not present

## 2016-10-19 DIAGNOSIS — K5900 Constipation, unspecified: Secondary | ICD-10-CM | POA: Diagnosis not present

## 2016-10-19 DIAGNOSIS — I10 Essential (primary) hypertension: Secondary | ICD-10-CM | POA: Insufficient documentation

## 2016-10-19 DIAGNOSIS — Z7689 Persons encountering health services in other specified circumstances: Secondary | ICD-10-CM | POA: Insufficient documentation

## 2016-10-19 DIAGNOSIS — K449 Diaphragmatic hernia without obstruction or gangrene: Secondary | ICD-10-CM | POA: Insufficient documentation

## 2016-10-19 DIAGNOSIS — Z79899 Other long term (current) drug therapy: Secondary | ICD-10-CM | POA: Insufficient documentation

## 2016-10-19 DIAGNOSIS — R202 Paresthesia of skin: Secondary | ICD-10-CM | POA: Diagnosis not present

## 2016-10-19 LAB — CBC WITH DIFFERENTIAL/PLATELET
BASOS ABS: 0 10*3/uL (ref 0–0.1)
Basophils Relative: 1 %
EOS PCT: 7 %
Eosinophils Absolute: 0.1 10*3/uL (ref 0–0.7)
HCT: 29.3 % — ABNORMAL LOW (ref 35.0–47.0)
Hemoglobin: 10.2 g/dL — ABNORMAL LOW (ref 12.0–16.0)
Lymphocytes Relative: 28 %
Lymphs Abs: 0.3 10*3/uL — ABNORMAL LOW (ref 1.0–3.6)
MCH: 30.4 pg (ref 26.0–34.0)
MCHC: 34.6 g/dL (ref 32.0–36.0)
MCV: 87.6 fL (ref 80.0–100.0)
MONO ABS: 0.2 10*3/uL (ref 0.2–0.9)
MONOS PCT: 23 %
NEUTROS PCT: 41 %
Neutro Abs: 0.4 10*3/uL — ABNORMAL LOW (ref 1.7–7.7)
PLATELETS: 87 10*3/uL — AB (ref 150–440)
RBC: 3.34 MIL/uL — AB (ref 3.80–5.20)
RDW: 19.3 % — AB (ref 11.5–14.5)
WBC: 1 10*3/uL — AB (ref 3.6–11.0)

## 2016-10-19 NOTE — Telephone Encounter (Signed)
-----   Message from Cammie Sickle, MD sent at 10/19/2016 10:55 AM EST ----- Please talk to pt re: neutropenic precautions. Thx

## 2016-10-19 NOTE — Telephone Encounter (Signed)
Left vm for patient to return my phone call. 

## 2016-10-22 ENCOUNTER — Inpatient Hospital Stay (HOSPITAL_BASED_OUTPATIENT_CLINIC_OR_DEPARTMENT_OTHER): Payer: Medicare Other | Admitting: Internal Medicine

## 2016-10-22 ENCOUNTER — Inpatient Hospital Stay: Payer: Medicare Other

## 2016-10-22 VITALS — BP 101/68 | HR 114 | Temp 98.7°F | Wt 134.2 lb

## 2016-10-22 DIAGNOSIS — E7800 Pure hypercholesterolemia, unspecified: Secondary | ICD-10-CM

## 2016-10-22 DIAGNOSIS — C8333 Diffuse large B-cell lymphoma, intra-abdominal lymph nodes: Secondary | ICD-10-CM

## 2016-10-22 DIAGNOSIS — R202 Paresthesia of skin: Secondary | ICD-10-CM

## 2016-10-22 DIAGNOSIS — E119 Type 2 diabetes mellitus without complications: Secondary | ICD-10-CM

## 2016-10-22 DIAGNOSIS — R0602 Shortness of breath: Secondary | ICD-10-CM | POA: Diagnosis not present

## 2016-10-22 DIAGNOSIS — Z87891 Personal history of nicotine dependence: Secondary | ICD-10-CM

## 2016-10-22 DIAGNOSIS — K5900 Constipation, unspecified: Secondary | ICD-10-CM

## 2016-10-22 DIAGNOSIS — R200 Anesthesia of skin: Secondary | ICD-10-CM | POA: Diagnosis not present

## 2016-10-22 DIAGNOSIS — R590 Localized enlarged lymph nodes: Secondary | ICD-10-CM

## 2016-10-22 DIAGNOSIS — K121 Other forms of stomatitis: Secondary | ICD-10-CM

## 2016-10-22 DIAGNOSIS — J988 Other specified respiratory disorders: Secondary | ICD-10-CM | POA: Diagnosis not present

## 2016-10-22 DIAGNOSIS — R5383 Other fatigue: Secondary | ICD-10-CM | POA: Diagnosis not present

## 2016-10-22 DIAGNOSIS — Z7984 Long term (current) use of oral hypoglycemic drugs: Secondary | ICD-10-CM

## 2016-10-22 DIAGNOSIS — Z79899 Other long term (current) drug therapy: Secondary | ICD-10-CM

## 2016-10-22 DIAGNOSIS — Z5181 Encounter for therapeutic drug level monitoring: Secondary | ICD-10-CM

## 2016-10-22 DIAGNOSIS — I10 Essential (primary) hypertension: Secondary | ICD-10-CM | POA: Diagnosis not present

## 2016-10-22 DIAGNOSIS — Z8711 Personal history of peptic ulcer disease: Secondary | ICD-10-CM

## 2016-10-22 LAB — BASIC METABOLIC PANEL
ANION GAP: 8 (ref 5–15)
BUN: 13 mg/dL (ref 6–20)
CALCIUM: 9.4 mg/dL (ref 8.9–10.3)
CO2: 29 mmol/L (ref 22–32)
Chloride: 100 mmol/L — ABNORMAL LOW (ref 101–111)
Creatinine, Ser: 0.69 mg/dL (ref 0.44–1.00)
GFR calc Af Amer: >60 mL/min (ref >60)
GLUCOSE: 119 mg/dL — AB (ref 65–99)
POTASSIUM: 3.3 mmol/L — AB (ref 3.5–5.1)
SODIUM: 137 mmol/L (ref 135–145)

## 2016-10-22 LAB — CBC WITH DIFFERENTIAL/PLATELET
Basophils Absolute: 0.1 10*3/uL (ref 0–0.1)
Basophils Relative: 1 %
EOS PCT: 1 %
Eosinophils Absolute: 0.1 10*3/uL (ref 0–0.7)
HCT: 32.5 % — ABNORMAL LOW (ref 35.0–47.0)
Hemoglobin: 11 g/dL — ABNORMAL LOW (ref 12.0–16.0)
LYMPHS ABS: 0.8 10*3/uL — AB (ref 1.0–3.6)
LYMPHS PCT: 7 %
MCH: 30.1 pg (ref 26.0–34.0)
MCHC: 33.8 g/dL (ref 32.0–36.0)
MCV: 88.9 fL (ref 80.0–100.0)
MONO ABS: 1.4 10*3/uL — AB (ref 0.2–0.9)
MONOS PCT: 12 %
Neutro Abs: 9.6 10*3/uL — ABNORMAL HIGH (ref 1.4–6.5)
Neutrophils Relative %: 79 %
PLATELETS: 151 10*3/uL (ref 150–440)
RBC: 3.65 MIL/uL — ABNORMAL LOW (ref 3.80–5.20)
RDW: 20.7 % — AB (ref 11.5–14.5)
WBC: 12 10*3/uL — ABNORMAL HIGH (ref 3.6–11.0)

## 2016-10-22 NOTE — Progress Notes (Signed)
Patient c/o new onset of chest pain with new chemo treatment.

## 2016-10-22 NOTE — Assessment & Plan Note (Addendum)
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 S/p  #2 of DA-EPOCH [s/p #1 cycle of R-CHOP] chemotherapy.  # Patient tolerated chemotherapy well- except for extreme fatigue/generalized weakness post chemotherapy. We will get a PET scan prior to next visit in 1 week. We'll push chemotherapy by 1 week.   # Shortness of breath with exertion/ appears more fatigue related than true shortness of breath. Check MUGA scan to rule out heart issues.   # Oral ulcers-salt/baking soda; Magic mouth-improved.   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 Srable.   # Constipation from chemotherapy. on MiraLAX prn.   # Follow up in 1 week PET scan MUGA scan labs. No chemotherapy at that visit [patient was due to get chemotherapy on March 12th].

## 2016-10-22 NOTE — Progress Notes (Signed)
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  female with above history of diffuse large B-cell lymphoma [triple hit] currently on DA- [facility] is here For follow-up.  Patient had a Second cycle of dose adjusted R-EPOCH in the hospital.  Patient complains of extreme fatigue. Constipation improved. Complains of mild tingling and numbness in her feet. Not not any worse. No fevers or chills. No abdominal pain.  ROS: A complete 10 point review of system is done which is negative except mentioned above in history of present illness  MEDICAL HISTORY:  Past Medical History:  Diagnosis Date  . Diabetes mellitus without complication ([place])   . History of gastric ulcer   . Hypercholesterolemia   . Hypertension   . ILD (interstitial lung disease) ([place])    8 yrs ago  . Lymphadenopathy     SURGICAL HISTORY: Past Surgical History:  Procedure Laterality Date  . BREAST BIOPSY Left 2010   neg  . HERNIA REPAIR    . PARTIAL HYSTERECTOMY     age 55  . PERIPHERAL VASCULAR CATHETERIZATION N/A 08/22/2016   Procedure: [name];  Surgeon: [name], MD;   Location: [facility];  Service: Cardiovascular;  Laterality: N/A;  . TONSILLECTOMY      SOCIAL HISTORY: quit smoking 40 years ago; human resources retd; in [place]; no alcohol Social History   Social History  . Marital status: Married    Spouse name: Elianys Greulich  . Number of children: 2  . Years of education: N/A   Occupational History  . Retired    Social History Main Topics  . Smoking status: Former Smoker    Quit date: 08/21/1958  . Smokeless tobacco: Never Used  . Alcohol use No  . Drug use: No  . Sexual activity: No   Other Topics Concern  . Not on file   Social History Narrative   Not applicable at this time    FAMILY HISTORY: Family History  Problem Relation Age of Onset  . Heart disease Mother   . Heart disease Father   . Heart disease Brother     ALLERGIES:  is allergic to cefuroxime axetil and doxycycline.  MEDICATIONS:  Current Outpatient Prescriptions  Medication Sig Dispense Refill  . b complex vitamins tablet Take 1 tablet by mouth daily.    Marland Kitchen gabapentin (NEURONTIN) 100 MG capsule Take 1 capsule (100 mg total) by mouth 3 (three) times daily. 90 capsule 1  . latanoprost (XALATAN) 0.005 % ophthalmic solution Place 1 drop into both eyes at bedtime.     . lidocaine-prilocaine (EMLA) cream Apply  cream 1 hour before chemotherapy treatment, place small amount of saran wrap over cream to protect clothing. 30 g 1  . losartan-hydrochlorothiazide (HYZAAR) 100-12.5 MG tablet Take 1 tablet by mouth daily.    . Magnesium 400 MG TABS Take 1 tablet by mouth daily.    . metFORMIN (GLUCOPHAGE) 500 MG tablet Take 1 tablet by mouth daily with breakfast.     . ondansetron (ZOFRAN) 8 MG tablet TAKE ONE TABLET BY MOUTH EVERY 8 HOURS AS NEEDED FOR NAUSEA AND VOMITING (TAKE TWICE A DAY FOR THE FIRST 3 DAYS AFTER CHEMOTHERAPY) 30 tablet 0  . oxyCODONE (ROXICODONE) 5 MG immediate release tablet Take 0.5 tablets (2.5 mg total) by mouth every 6 (six) hours as needed  for breakthrough pain. 60 tablet 0  . pantoprazole (PROTONIX) 40 MG tablet Take 1 tablet by mouth 2 (two) times daily.    . pravastatin (PRAVACHOL) 40 MG tablet Take 1 tablet by mouth daily.    . prochlorperazine (COMPAZINE) 10 MG tablet Take 1 tablet (10 mg total) by mouth every 6 (six) hours as needed for nausea, vomiting or refractory nausea / vomiting. 30 tablet 0   No current facility-administered medications for this visit.       Marland Kitchen  PHYSICAL EXAMINATION: ECOG PERFORMANCE STATUS: 1 - Symptomatic but completely ambulatory  Vitals:   10/22/16 1421  BP: 101/68  Pulse: (!) 114  Temp: 98.7 F (37.1 C)   Filed Weights   10/22/16 1421  Weight: 134 lb 4 oz (60.9 kg)    GENERAL: Well-nourished well-developed; Alert, no distress and comfortable.   With her husband. EYES: no pallor or icterus OROPHARYNX: no thrush or ulceration; good dentition  NECK: supple, no masses felt LYMPH:  no palpable lymphadenopathy in the cervical, axillary or inguinal regions LUNGS: clear to auscultation and  No wheeze or crackles HEART/CVS: regular rate & rhythm and no murmurs; No lower extremity edema ABDOMEN: abdomen soft, mild tenderness on deep palpation and normal bowel sounds. No hepatosplenomegaly Musculoskeletal:no cyanosis of digits and no clubbing  PSYCH: alert & oriented x 3 with fluent speech NEURO: no focal motor/sensory deficits SKIN:  no rashes or significant lesions  LABORATORY DATA:  I have reviewed the data as listed Lab Results  Component Value Date   WBC 12.0 (H) 10/22/2016   HGB 11.0 (L) 10/22/2016   HCT 32.5 (L) 10/22/2016   MCV 88.9 10/22/2016   PLT 151 10/22/2016    Recent Labs  09/20/16 0822 10/05/16 1420 10/09/16 0434 10/12/16 0514 10/17/16 0957 10/22/16 1345  NA 139 137 141  --  137 137  K 3.6 3.0* 3.9  --  3.1* 3.3*  CL 104 101 109  --  99* 100*  CO2 28 28 27   --  30 29  GLUCOSE 136* 116* 195*  --  115* 119*  BUN 18 13 10   --  19 13  CREATININE 0.46  0.44 0.40* 0.43* 0.61 0.69  CALCIUM 9.1 8.9 8.7*  --  8.5* 9.4  GFRNONAA >60 >60 >60 >60 >60 >60  GFRAA >60 >60 >60 >60 >60 >60  PROT 6.2* 6.5  --   --  5.8*  --   ALBUMIN 3.4* 3.7  --   --  3.5  --   AST 14* 19  --   --  20  --   ALT 14 18  --   --  16  --   ALKPHOS 41 49  --   --  51  --  BILITOT 0.6 0.6  --   --  1.0  --     RADIOGRAPHIC STUDIES: I have personally reviewed the radiological images as listed and agreed with the findings in the report. No results found.  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 S/p  #2 of DA-EPOCH [s/p #1 cycle of R-CHOP] chemotherapy.  # Patient tolerated chemotherapy well- except for extreme fatigue/generalized weakness post chemotherapy. We will get a PET scan prior to next visit in 1 week. We'll push chemotherapy by 1 week.   # Shortness of breath with exertion/ appears more fatigue related than true shortness of breath. Check MUGA scan to rule out heart issues.   # Oral ulcers-salt/baking soda; Magic mouth-improved.   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 Srable.   # Constipation from chemotherapy. on MiraLAX prn.   # Follow up in 1 week PET scan MUGA scan labs. No chemotherapy at that visit [patient was due to get chemotherapy on March 12th].   All questions were answered. The patient knows to call the clinic with any problems, questions or concerns.    Cammie Sickle, MD 10/22/2016 4:29 PM

## 2016-10-29 ENCOUNTER — Ambulatory Visit: Payer: Medicare Other

## 2016-10-29 ENCOUNTER — Inpatient Hospital Stay: Payer: Medicare Other | Admitting: Internal Medicine

## 2016-10-29 ENCOUNTER — Inpatient Hospital Stay: Payer: Medicare Other

## 2016-10-30 ENCOUNTER — Other Ambulatory Visit: Payer: Self-pay | Admitting: *Deleted

## 2016-10-30 ENCOUNTER — Inpatient Hospital Stay: Payer: Medicare Other

## 2016-10-30 ENCOUNTER — Encounter
Admission: RE | Admit: 2016-10-30 | Discharge: 2016-10-30 | Disposition: A | Discharge status: Home / Self Care | Payer: Medicare Other | Source: Ambulatory Visit | Attending: Internal Medicine | Admitting: Internal Medicine

## 2016-10-30 DIAGNOSIS — C8333 Diffuse large B-cell lymphoma, intra-abdominal lymph nodes: Secondary | ICD-10-CM | POA: Diagnosis present

## 2016-10-30 DIAGNOSIS — Z5181 Encounter for therapeutic drug level monitoring: Secondary | ICD-10-CM | POA: Insufficient documentation

## 2016-10-30 DIAGNOSIS — Z79899 Other long term (current) drug therapy: Secondary | ICD-10-CM | POA: Diagnosis present

## 2016-10-30 MED ORDER — TECHNETIUM TC 99M-LABELED RED BLOOD CELLS IV KIT
23.9000 | PACK | Freq: Once | INTRAVENOUS | Status: AC | PRN
  Administered 2016-10-30: 23.9 via INTRAVENOUS

## 2016-10-31 ENCOUNTER — Telehealth: Payer: Self-pay | Admitting: *Deleted

## 2016-10-31 ENCOUNTER — Ambulatory Visit: Payer: Medicare Other

## 2016-10-31 MED ORDER — DIAZEPAM 5 MG PO TABS: 5.0000 mg | ORAL_TABLET | Freq: Once | ORAL | 0 refills | Status: AC

## 2016-10-31 NOTE — Telephone Encounter (Signed)
Valium 5 mg # 3 take 1 30 mins prior to scan, may repeat as needed. Faxed

## 2016-10-31 NOTE — Telephone Encounter (Signed)
Has claustrophobia and is scheduled for a PET tomorrow Please advise

## 2016-11-01 ENCOUNTER — Ambulatory Visit: Payer: Medicare Other

## 2016-11-01 ENCOUNTER — Encounter
Admission: RE | Admit: 2016-11-01 | Discharge: 2016-11-01 | Disposition: A | Discharge status: Home / Self Care | Payer: Medicare Other | Source: Ambulatory Visit | Attending: Internal Medicine | Admitting: Internal Medicine

## 2016-11-01 DIAGNOSIS — C8333 Diffuse large B-cell lymphoma, intra-abdominal lymph nodes: Secondary | ICD-10-CM

## 2016-11-01 LAB — GLUCOSE, CAPILLARY: GLUCOSE-CAPILLARY: 131 mg/dL — AB (ref 65–99)

## 2016-11-01 MED ORDER — FLUDEOXYGLUCOSE F - 18 (FDG) INJECTION
12.0000 | Freq: Once | INTRAVENOUS | Status: AC | PRN
  Administered 2016-11-01: 12.52 via INTRAVENOUS

## 2016-11-02 ENCOUNTER — Inpatient Hospital Stay: Payer: Medicare Other

## 2016-11-02 ENCOUNTER — Ambulatory Visit: Payer: Medicare Other

## 2016-11-02 ENCOUNTER — Other Ambulatory Visit: Payer: Self-pay | Admitting: Internal Medicine

## 2016-11-02 ENCOUNTER — Inpatient Hospital Stay (HOSPITAL_BASED_OUTPATIENT_CLINIC_OR_DEPARTMENT_OTHER): Payer: Medicare Other | Admitting: Internal Medicine

## 2016-11-02 VITALS — BP 121/71 | HR 106 | Temp 98.2°F | Resp 18 | Ht 67.0 in | Wt 134.0 lb

## 2016-11-02 DIAGNOSIS — R200 Anesthesia of skin: Secondary | ICD-10-CM

## 2016-11-02 DIAGNOSIS — R0602 Shortness of breath: Secondary | ICD-10-CM

## 2016-11-02 DIAGNOSIS — K5900 Constipation, unspecified: Secondary | ICD-10-CM

## 2016-11-02 DIAGNOSIS — J982 Interstitial emphysema: Secondary | ICD-10-CM | POA: Diagnosis not present

## 2016-11-02 DIAGNOSIS — E7800 Pure hypercholesterolemia, unspecified: Secondary | ICD-10-CM | POA: Diagnosis not present

## 2016-11-02 DIAGNOSIS — R5383 Other fatigue: Secondary | ICD-10-CM

## 2016-11-02 DIAGNOSIS — R202 Paresthesia of skin: Secondary | ICD-10-CM | POA: Diagnosis not present

## 2016-11-02 DIAGNOSIS — C8333 Diffuse large B-cell lymphoma, intra-abdominal lymph nodes: Secondary | ICD-10-CM | POA: Diagnosis not present

## 2016-11-02 DIAGNOSIS — K449 Diaphragmatic hernia without obstruction or gangrene: Secondary | ICD-10-CM

## 2016-11-02 DIAGNOSIS — J988 Other specified respiratory disorders: Secondary | ICD-10-CM

## 2016-11-02 DIAGNOSIS — K121 Other forms of stomatitis: Secondary | ICD-10-CM | POA: Diagnosis not present

## 2016-11-02 DIAGNOSIS — R590 Localized enlarged lymph nodes: Secondary | ICD-10-CM

## 2016-11-02 DIAGNOSIS — G629 Polyneuropathy, unspecified: Secondary | ICD-10-CM

## 2016-11-02 DIAGNOSIS — Z7984 Long term (current) use of oral hypoglycemic drugs: Secondary | ICD-10-CM

## 2016-11-02 DIAGNOSIS — Z79899 Other long term (current) drug therapy: Secondary | ICD-10-CM

## 2016-11-02 DIAGNOSIS — Z8711 Personal history of peptic ulcer disease: Secondary | ICD-10-CM

## 2016-11-02 DIAGNOSIS — E119 Type 2 diabetes mellitus without complications: Secondary | ICD-10-CM

## 2016-11-02 DIAGNOSIS — I10 Essential (primary) hypertension: Secondary | ICD-10-CM

## 2016-11-02 DIAGNOSIS — Z87891 Personal history of nicotine dependence: Secondary | ICD-10-CM

## 2016-11-02 LAB — COMPREHENSIVE METABOLIC PANEL
ALT: 12 U/L — ABNORMAL LOW (ref 14–54)
AST: 16 U/L (ref 15–41)
Albumin: 4 g/dL (ref 3.5–5.0)
Alkaline Phosphatase: 46 U/L (ref 38–126)
Anion gap: 7 (ref 5–15)
BUN: 15 mg/dL (ref 6–20)
CHLORIDE: 101 mmol/L (ref 101–111)
CO2: 29 mmol/L (ref 22–32)
Calcium: 9.1 mg/dL (ref 8.9–10.3)
Creatinine, Ser: 0.62 mg/dL (ref 0.44–1.00)
Glucose, Bld: 120 mg/dL — ABNORMAL HIGH (ref 65–99)
POTASSIUM: 3.9 mmol/L (ref 3.5–5.1)
SODIUM: 137 mmol/L (ref 135–145)
Total Bilirubin: 0.5 mg/dL (ref 0.3–1.2)
Total Protein: 6.7 g/dL (ref 6.5–8.1)

## 2016-11-02 LAB — CBC WITH DIFFERENTIAL/PLATELET
BASOS ABS: 0 10*3/uL (ref 0–0.1)
Basophils Relative: 0 %
EOS ABS: 0 10*3/uL (ref 0–0.7)
EOS PCT: 0 %
HCT: 32.8 % — ABNORMAL LOW (ref 35.0–47.0)
Hemoglobin: 11.2 g/dL — ABNORMAL LOW (ref 12.0–16.0)
Lymphocytes Relative: 9 %
Lymphs Abs: 0.7 10*3/uL — ABNORMAL LOW (ref 1.0–3.6)
MCH: 31.2 pg (ref 26.0–34.0)
MCHC: 34.1 g/dL (ref 32.0–36.0)
MCV: 91.5 fL (ref 80.0–100.0)
MONO ABS: 1.1 10*3/uL — AB (ref 0.2–0.9)
Monocytes Relative: 14 %
Neutro Abs: 6.2 10*3/uL (ref 1.4–6.5)
Neutrophils Relative %: 77 %
PLATELETS: 288 10*3/uL (ref 150–440)
RBC: 3.59 MIL/uL — ABNORMAL LOW (ref 3.80–5.20)
RDW: 21.5 % — AB (ref 11.5–14.5)
WBC: 8.1 10*3/uL (ref 3.6–11.0)

## 2016-11-02 NOTE — Assessment & Plan Note (Addendum)
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 S/p  #2 of DA-EPOCH [s/p #1 cycle of R-CHOP] chemotherapy.  # 11/01/2016 PET excellent PR; but pneumomediastinum/tiny right-sided pneumothorax. [C discussion below]. Patient tolerated chemotherapy well- fatigue mild tingling and numbness   # Proceed with cycle #4 chemotherapy starting March 19th/ outpatient plan. Plan a total of 6 cycles of chemotherapy. A repeat MUGA scan March 13 normal ejection fraction.  # Given the pneumomediastinum/ small pneumothorax right side- question related to coughing versus GI perforation/ [large hiatal hernia]. Recommend esophagus Gastrografin study to rule out any perforation. Will also get a chest x-ray in in 10 days to ensure the stability of the pneumothorax.  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 Stable.    # Constipation from chemotherapy/improved. on MiraLAX prn.   # Starting week off March 26 Monday Wednesday Friday labs; follow-up with me March 28.  # I reviewed the blood work- with the patient in detail; also reviewed the imaging independently [as summarized above]; and with the patient in detail.

## 2016-11-02 NOTE — Progress Notes (Signed)
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  female with above history of diffuse large B-cell lymphoma [triple hit] [facility] is [facility] For follow-up;  Reviewed the results of her restaging PET scan.  Patient continues to complain of fatigue. Overall improved. Mild chest discomfort/shortness of breath on exertion. However not significantly worse; overall improving.  Complains of mild tingling and numbness in her feet. Not not any worse. No fevers or chills. No abdominal pain. Patient has mild cough. Not any worse. No swelling in the legs. Denies any difficulty swallowing or pain with swallowing.  ROS: A complete 10 point review of system is done which is negative except mentioned above in history of present illness  MEDICAL HISTORY:  Past Medical History:  Diagnosis Date  . Diabetes mellitus without complication ([place])   . History of gastric ulcer   . Hypercholesterolemia   . Hypertension   . ILD (interstitial lung disease) ([name])    8 yrs ago  . Lymphadenopathy     SURGICAL HISTORY: Past Surgical History:  Procedure Laterality  Date  . BREAST BIOPSY Left 2010   neg  . HERNIA REPAIR    . PARTIAL HYSTERECTOMY     age 43  . PERIPHERAL VASCULAR CATHETERIZATION N/A 08/22/2016   Procedure: [name];  Surgeon: [name], MD;  Location: [facility];  Service: Cardiovascular;  Laterality: N/A;  . TONSILLECTOMY      SOCIAL HISTORY: quit smoking 40 years ago; human resources retd; in [place]; no alcohol Social History   Social History  . Marital status: Married    Spouse name: Laquinda Moller  . Number of children: 2  . Years of education: N/A   Occupational History  . Retired    Social History Main Topics  . Smoking status: Former Smoker    Quit date: 08/21/1958  . Smokeless tobacco: Never Used  . Alcohol use No  . Drug use: No  . Sexual activity: No   Other Topics Concern  . Not on file   Social History Narrative   Not applicable at this time    FAMILY HISTORY: Family History  Problem Relation Age of Onset  . Heart disease Mother   . Heart disease Father   . Heart disease Brother     ALLERGIES:  is allergic to cefuroxime axetil and doxycycline.  MEDICATIONS:  Current Outpatient Prescriptions  Medication Sig Dispense Refill  . b complex vitamins tablet Take 1 tablet by mouth daily.    Marland Kitchen gabapentin (NEURONTIN) 100 MG capsule Take 1 capsule (100 mg total)  by mouth 3 (three) times daily. 90 capsule 1  . latanoprost (XALATAN) 0.005 % ophthalmic solution Place 1 drop into both eyes at bedtime.     . lidocaine-prilocaine (EMLA) cream Apply cream 1 hour before chemotherapy treatment, place small amount of saran wrap over cream to protect clothing. 30 g 1  . losartan-hydrochlorothiazide (HYZAAR) 100-12.5 MG tablet Take 1 tablet by mouth daily.    . Magnesium 400 MG TABS Take 1 tablet by mouth daily.    . metFORMIN (GLUCOPHAGE) 500 MG tablet Take 1 tablet by mouth daily with breakfast.     . ondansetron (ZOFRAN) 8 MG tablet TAKE ONE TABLET BY MOUTH EVERY 8 HOURS AS NEEDED FOR  NAUSEA AND VOMITING (TAKE TWICE A DAY FOR THE FIRST 3 DAYS AFTER CHEMOTHERAPY) 30 tablet 0  . pantoprazole (PROTONIX) 40 MG tablet Take 1 tablet by mouth 2 (two) times daily.    . pravastatin (PRAVACHOL) 40 MG tablet Take 1 tablet by mouth daily.    . prochlorperazine (COMPAZINE) 10 MG tablet Take 1 tablet (10 mg total) by mouth every 6 (six) hours as needed for nausea, vomiting or refractory nausea / vomiting. 30 tablet 0  . oxyCODONE (ROXICODONE) 5 MG immediate release tablet Take 0.5 tablets (2.5 mg total) by mouth every 6 (six) hours as needed for breakthrough pain. (Patient not taking: Reported on 11/02/2016) 60 tablet 0   No current facility-administered medications for this visit.       Marland Kitchen  PHYSICAL EXAMINATION: ECOG PERFORMANCE STATUS: 1 - Symptomatic but completely ambulatory  Vitals:   11/02/16 1119  BP: 121/71  Pulse: (!) 106  Resp: 18  Temp: 98.2 F (36.8 C)   Filed Weights   11/02/16 1119  Weight: 134 lb (60.8 kg)    GENERAL: Well-nourished well-developed; Alert, no distress and comfortable.   With her husband. EYES: no pallor or icterus OROPHARYNX: no thrush or ulceration; good dentition  NECK: supple, no masses felt LYMPH:  no palpable lymphadenopathy in the cervical, axillary or inguinal regions LUNGS: clear to auscultation and  No wheeze or crackles HEART/CVS: regular rate & rhythm and no murmurs; No lower extremity edema ABDOMEN: abdomen soft, mild tenderness on deep palpation and normal bowel sounds. No hepatosplenomegaly Musculoskeletal:no cyanosis of digits and no clubbing  PSYCH: alert & oriented x 3 with fluent speech NEURO: no focal motor/sensory deficits SKIN:  no rashes or significant lesions  LABORATORY DATA:  I have reviewed the data as listed Lab Results  Component Value Date   WBC 8.1 11/02/2016   HGB 11.2 (L) 11/02/2016   HCT 32.8 (L) 11/02/2016   MCV 91.5 11/02/2016   PLT 288 11/02/2016    Recent Labs  10/05/16 1420  10/17/16 0957  10/22/16 1345 11/02/16 1104  NA 137  < > 137 137 137  K 3.0*  < > 3.1* 3.3* 3.9  CL 101  < > 99* 100* 101  CO2 28  < > 30 29 29   GLUCOSE 116*  < > 115* 119* 120*  BUN 13  < > 19 13 15   CREATININE 0.44  < > 0.61 0.69 0.62  CALCIUM 8.9  < > 8.5* 9.4 9.1  GFRNONAA >60  < > >60 >60 >60  GFRAA >60  < > >60 >60 >60  PROT 6.5  --  5.8*  --  6.7  ALBUMIN 3.7  --  3.5  --  4.0  AST 19  --  20  --  16  ALT 18  --  16  --  12*  ALKPHOS 49  --  51  --  46  BILITOT 0.6  --  1.0  --  0.5  < > = values in this interval not displayed.  RADIOGRAPHIC STUDIES: I have personally reviewed the radiological images as listed and agreed with the findings in the report. Nm Cardiac Muga Rest  Result Date: 10/30/2016 CLINICAL DATA:  Diffuse large B-cell lymphoma, monitoring of cardia toxic drug therapy EXAM: NUCLEAR MEDICINE CARDIAC BLOOD POOL IMAGING (MUGA) TECHNIQUE: Cardiac multi-gated acquisition was performed at rest following intravenous injection of Tc-59m labeled red blood cells. RADIOPHARMACEUTICALS:  23.9 mCi Tc-53m pertechnetate in-vitro labeled autologous red blood cells IV COMPARISON:  None FINDINGS: Calculated LEFT ventricular ejection fraction is 55%, normal. Study was obtained at a cardiac rate of 98 beats per minute. Patient was rhythmic during image acquisition. Wall motion analysis of the LEFT ventricle in 3 projections demonstrates normal LEFT ventricular wall motion. IMPRESSION: Normal LEFT ventricular ejection fraction of 55%. Normal LV wall motion. Electronically Signed   By: Lavonia Dana M.D.   On: 10/30/2016 11:41   Nm Pet Image Restag (ps) Skull Base To Thigh  Result Date: 11/01/2016 CLINICAL DATA:  Subsequent treatment strategy for diffuse large B-cell lymphoma of the intraabdominal lymph nodes diagnosed December 2017, status post 2 rounds of chemotherapy, presenting for restaging. EXAM: NUCLEAR MEDICINE PET SKULL BASE TO THIGH TECHNIQUE: 12.5 mCi F-18 FDG was injected intravenously.  Full-ring PET imaging was performed from the skull base to thigh after the radiotracer. CT data was obtained and used for attenuation correction and anatomic localization. FASTING BLOOD GLUCOSE:  Value: 131 mg/dl COMPARISON:  08/03/2016 PET-CT. FINDINGS: NECK No hypermetabolic lymph nodes in the neck. CHEST Right internal jugular MediPort terminates at the cavoatrial junction. Left anterior descending and left circumflex coronary atherosclerosis. Atherosclerotic nonaneurysmal thoracic aorta. No pleural effusions. There is new pneumomediastinum throughout the region of the carina, which extends into the right suprahilar region. There is an associated new tiny pneumothorax in the medial upper right pleural space. No pleural effusions. No mediastinal fluid collections. No left pneumothorax. No appreciable esophageal wall thickening or gas. No pathologically enlarged or hypermetabolic axillary, mediastinal or hilar lymph nodes. No acute consolidative airspace disease, lung masses or significant pulmonary nodules. No appreciable change in confluent patchy subpleural reticulation throughout both lungs. Suture line from prior surgical lung biopsy is again seen in the posterior right mid lung. ABDOMEN/PELVIS There is residual mild retroperitoneal lymphadenopathy predominantly in the left para-aortic chain measuring up to 1.8 cm short axis (series 3/image 149) with mild residual hypermetabolism with max SUV 3.7, markedly reduced in size from 9.4 cm short axis on 08/03/2016 with marked reduction in hypermetabolism, previous max SUV 34.7. Previously visualized enlarged hypermetabolic left internal iliac lymph node is absent on this scan. No new pathologically enlarged or hypermetabolic abdominopelvic lymph nodes. No abnormal hypermetabolic activity within the liver, pancreas, adrenal glands, or spleen. Moderate hiatal hernia. Subcentimeter hypodense left liver lobe lesions are too small to characterize and are stable and not  associated with hypermetabolism, considered benign. Stable granulomatous calcification in the right liver lobe. Normal size spleen. Atherosclerotic nonaneurysmal abdominal aorta. Hysterectomy. SKELETON No focal hypermetabolic activity to suggest skeletal metastasis. IMPRESSION: 1. New pneumomediastinum surrounding the carina extending into the right suprahilar region with new tiny pneumothorax in the medial upper right pleural space. No pleural effusions. No mediastinal fluid collections. No evidence of esophageal wall thickening or gas. These findings may represent spontaneous pneumothorax/pneumomediastinum, especially given the  patient's stable chronic interstitial lung disease. Correlate clinically for any history of a vigorous cough. Short-term PA and lateral chest radiograph follow-up advised. 2. Near complete metabolic response. Retroperitoneal lymphadenopathy is markedly reduced in size and metabolism, with residual mild hypermetabolism. Left internal iliac adenopathy has resolved. No newly enlarged or hypermetabolic lymph nodes. Normal size spleen without hypermetabolism. No marrow hypermetabolism. 3. Additional findings include aortic atherosclerosis and 2 vessel coronary atherosclerosis. Critical Value/emergent results were called by telephone at the time of interpretation on 11/01/2016 at 2:21 pm to Dr. Charlaine Dalton , who verbally acknowledged these results. Electronically Signed   By: Ilona Sorrel M.D.   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 S/p  #2 of DA-EPOCH [s/p #1 cycle of R-CHOP] chemotherapy.  # 11/01/2016 PET excellent PR; but pneumomediastinum/tiny right-sided pneumothorax. [C discussion below]. Patient tolerated chemotherapy well- fatigue mild tingling and numbness   # Proceed with  cycle #4 chemotherapy starting March 19th/ outpatient plan. Plan a total of 6 cycles of chemotherapy. A repeat MUGA scan March 13 normal ejection fraction.  # Given the pneumomediastinum/ small pneumothorax right side- question related to coughing versus GI perforation/ [large hiatal hernia]. Recommend esophagus Gastrografin study to rule out any perforation. Will also get a chest x-ray in in 10 days to ensure the stability of the pneumothorax.  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 Stable.    # Constipation from chemotherapy/improved. on MiraLAX prn.   # Starting week off March 26 Monday Wednesday Friday labs; follow-up with me March 28.  # I reviewed the blood work- with the patient in detail; also reviewed the imaging independently [as summarized above]; and with the patient in detail.   All questions were answered. The patient knows to call the clinic with any problems, questions or concerns.    Cammie Sickle, MD 11/02/2016 1:19 PM

## 2016-11-05 ENCOUNTER — Other Ambulatory Visit: Payer: Self-pay | Admitting: Internal Medicine

## 2016-11-05 ENCOUNTER — Inpatient Hospital Stay: Payer: Medicare Other

## 2016-11-05 VITALS — BP 106/66 | HR 101 | Temp 98.9°F | Resp 18

## 2016-11-05 DIAGNOSIS — C8333 Diffuse large B-cell lymphoma, intra-abdominal lymph nodes: Secondary | ICD-10-CM

## 2016-11-05 MED ORDER — SODIUM CHLORIDE 0.9 % IV SOLN: 375.0000 mg/m2 | Freq: Once | INTRAVENOUS | Status: DC

## 2016-11-05 MED ORDER — PREDNISONE 50 MG PO TABS: ORAL_TABLET | ORAL | 3 refills | Status: DC

## 2016-11-05 MED ORDER — ACETAMINOPHEN 325 MG PO TABS
650.0000 mg | ORAL_TABLET | Freq: Once | ORAL | Status: AC
  Administered 2016-11-05: 650 mg via ORAL
  Filled 2016-11-05: qty 2

## 2016-11-05 MED ORDER — SODIUM CHLORIDE 0.9 % IV SOLN
INTRAVENOUS | Status: DC
  Administered 2016-11-05: 10:00:00 via INTRAVENOUS
  Filled 2016-11-05: qty 1000

## 2016-11-05 MED ORDER — VINCRISTINE SULFATE CHEMO INJECTION 1 MG/ML
Freq: Once | INTRAVENOUS | Status: AC
  Administered 2016-11-05: 12:00:00 via INTRAVENOUS
  Filled 2016-11-05: qty 11

## 2016-11-05 MED ORDER — DIPHENHYDRAMINE HCL 25 MG PO CAPS
50.0000 mg | ORAL_CAPSULE | Freq: Once | ORAL | Status: AC
  Administered 2016-11-05: 50 mg via ORAL
  Filled 2016-11-05: qty 2

## 2016-11-05 MED ORDER — ONDANSETRON HCL 40 MG/20ML IJ SOLN
Freq: Once | INTRAMUSCULAR | Status: AC
  Administered 2016-11-05: 8 mg via INTRAVENOUS
  Filled 2016-11-05: qty 4

## 2016-11-05 MED ORDER — SODIUM CHLORIDE 0.9 % IV SOLN
700.0000 mg | Freq: Once | INTRAVENOUS | Status: AC
  Administered 2016-11-05: 700 mg via INTRAVENOUS
  Filled 2016-11-05: qty 50

## 2016-11-06 ENCOUNTER — Inpatient Hospital Stay: Payer: Medicare Other

## 2016-11-06 VITALS — BP 100/63 | HR 83 | Temp 98.6°F | Resp 18

## 2016-11-06 DIAGNOSIS — C8333 Diffuse large B-cell lymphoma, intra-abdominal lymph nodes: Secondary | ICD-10-CM | POA: Diagnosis not present

## 2016-11-06 MED ORDER — ONDANSETRON HCL 40 MG/20ML IJ SOLN
Freq: Once | INTRAMUSCULAR | Status: DC
  Filled 2016-11-06: qty 4

## 2016-11-06 MED ORDER — SODIUM CHLORIDE 0.9 % IV SOLN
Freq: Once | INTRAVENOUS | Status: AC
  Administered 2016-11-06: 10:00:00 via INTRAVENOUS
  Filled 2016-11-06: qty 1000

## 2016-11-06 MED ORDER — SODIUM CHLORIDE 0.9 % IV SOLN
Freq: Once | INTRAVENOUS | Status: AC
  Administered 2016-11-06: 8 mg via INTRAVENOUS
  Filled 2016-11-06: qty 4

## 2016-11-06 MED ORDER — HEPARIN SOD (PORK) LOCK FLUSH 100 UNIT/ML IV SOLN: 500.0000 [IU] | Freq: Once | INTRAVENOUS | Status: AC

## 2016-11-06 MED ORDER — SODIUM CHLORIDE 0.9% FLUSH
10.0000 mL | INTRAVENOUS | Status: AC | PRN
  Administered 2016-11-06: 10 mL via INTRAVENOUS
  Filled 2016-11-06: qty 10

## 2016-11-06 MED ORDER — VINCRISTINE SULFATE CHEMO INJECTION 1 MG/ML
Freq: Once | INTRAVENOUS | Status: AC
  Administered 2016-11-06: 11:00:00 via INTRAVENOUS
  Filled 2016-11-06: qty 11

## 2016-11-07 ENCOUNTER — Inpatient Hospital Stay: Payer: Medicare Other

## 2016-11-07 VITALS — BP 133/71 | HR 77 | Temp 97.1°F | Resp 18

## 2016-11-07 DIAGNOSIS — C8333 Diffuse large B-cell lymphoma, intra-abdominal lymph nodes: Secondary | ICD-10-CM

## 2016-11-07 MED ORDER — VINCRISTINE SULFATE CHEMO INJECTION 1 MG/ML
Freq: Once | INTRAVENOUS | Status: AC
  Administered 2016-11-07: 10:00:00 via INTRAVENOUS
  Filled 2016-11-07: qty 11

## 2016-11-07 MED ORDER — SODIUM CHLORIDE 0.9 % IV SOLN
Freq: Once | INTRAVENOUS | Status: AC
  Administered 2016-11-07: 09:00:00 via INTRAVENOUS
  Filled 2016-11-07: qty 1000

## 2016-11-07 MED ORDER — SODIUM CHLORIDE 0.9% FLUSH
10.0000 mL | INTRAVENOUS | Status: AC | PRN
  Administered 2016-11-07: 10 mL via INTRAVENOUS
  Filled 2016-11-07: qty 10

## 2016-11-07 MED ORDER — SODIUM CHLORIDE 0.9 % IV SOLN
Freq: Once | INTRAVENOUS | Status: AC
  Administered 2016-11-07: 8 mg via INTRAVENOUS
  Filled 2016-11-07: qty 4

## 2016-11-08 ENCOUNTER — Inpatient Hospital Stay: Payer: Medicare Other

## 2016-11-08 DIAGNOSIS — C8330 Diffuse large B-cell lymphoma, unspecified site: Secondary | ICD-10-CM

## 2016-11-08 DIAGNOSIS — C8333 Diffuse large B-cell lymphoma, intra-abdominal lymph nodes: Secondary | ICD-10-CM | POA: Diagnosis not present

## 2016-11-08 LAB — COMPREHENSIVE METABOLIC PANEL
ALK PHOS: 32 U/L — AB (ref 38–126)
ALT: 11 U/L — ABNORMAL LOW (ref 14–54)
ANION GAP: 9 (ref 5–15)
AST: 23 U/L (ref 15–41)
Albumin: 3.6 g/dL (ref 3.5–5.0)
BILIRUBIN TOTAL: 0.6 mg/dL (ref 0.3–1.2)
BUN: 19 mg/dL (ref 6–20)
CALCIUM: 9.1 mg/dL (ref 8.9–10.3)
CO2: 23 mmol/L (ref 22–32)
Chloride: 106 mmol/L (ref 101–111)
Creatinine, Ser: 0.49 mg/dL (ref 0.44–1.00)
GFR calc non Af Amer: >60 mL/min (ref >60)
Glucose, Bld: 164 mg/dL — ABNORMAL HIGH (ref 65–99)
POTASSIUM: 3.5 mmol/L (ref 3.5–5.1)
SODIUM: 138 mmol/L (ref 135–145)
TOTAL PROTEIN: 6.1 g/dL — AB (ref 6.5–8.1)

## 2016-11-08 LAB — CBC WITH DIFFERENTIAL/PLATELET
BASOS ABS: 0 10*3/uL (ref 0–0.1)
Basophils Relative: 0 %
EOS ABS: 0 10*3/uL (ref 0–0.7)
EOS PCT: 0 %
HCT: 29.8 % — ABNORMAL LOW (ref 35.0–47.0)
Hemoglobin: 10.3 g/dL — ABNORMAL LOW (ref 12.0–16.0)
LYMPHS PCT: 10 %
Lymphs Abs: 0.5 10*3/uL — ABNORMAL LOW (ref 1.0–3.6)
MCH: 31.6 pg (ref 26.0–34.0)
MCHC: 34.6 g/dL (ref 32.0–36.0)
MCV: 91.4 fL (ref 80.0–100.0)
MONO ABS: 0.4 10*3/uL (ref 0.2–0.9)
Monocytes Relative: 8 %
Neutro Abs: 4.2 10*3/uL (ref 1.4–6.5)
Neutrophils Relative %: 82 %
PLATELETS: 229 10*3/uL (ref 150–440)
RBC: 3.26 MIL/uL — AB (ref 3.80–5.20)
RDW: 20 % — AB (ref 11.5–14.5)
WBC: 5.2 10*3/uL (ref 3.6–11.0)

## 2016-11-08 LAB — PROTIME-INR
INR: 0.91
Prothrombin Time: 12.2 seconds (ref 11.4–15.2)

## 2016-11-08 LAB — APTT: APTT: 24 s (ref 24–36)

## 2016-11-08 MED ORDER — SODIUM CHLORIDE 0.9% FLUSH
10.0000 mL | INTRAVENOUS | Status: AC | PRN
  Filled 2016-11-08: qty 10

## 2016-11-08 MED ORDER — SODIUM CHLORIDE 0.9 % IV SOLN
Freq: Once | INTRAVENOUS | Status: AC
  Administered 2016-11-08: 8 mg via INTRAVENOUS
  Filled 2016-11-08: qty 4

## 2016-11-08 MED ORDER — VINCRISTINE SULFATE CHEMO INJECTION 1 MG/ML
Freq: Once | INTRAVENOUS | Status: AC
  Administered 2016-11-08: 11:00:00 via INTRAVENOUS
  Filled 2016-11-08: qty 11

## 2016-11-08 MED ORDER — SODIUM CHLORIDE 0.9 % IV SOLN
Freq: Once | INTRAVENOUS | Status: AC
  Administered 2016-11-08: 10:00:00 via INTRAVENOUS
  Filled 2016-11-08: qty 1000

## 2016-11-09 ENCOUNTER — Other Ambulatory Visit: Payer: Self-pay | Admitting: Internal Medicine

## 2016-11-09 ENCOUNTER — Inpatient Hospital Stay: Payer: Medicare Other

## 2016-11-09 VITALS — BP 130/80 | HR 69 | Temp 98.0°F | Resp 18

## 2016-11-09 DIAGNOSIS — C8333 Diffuse large B-cell lymphoma, intra-abdominal lymph nodes: Secondary | ICD-10-CM | POA: Diagnosis not present

## 2016-11-09 DIAGNOSIS — C801 Malignant (primary) neoplasm, unspecified: Secondary | ICD-10-CM

## 2016-11-09 MED ORDER — SODIUM CHLORIDE 0.9 % IV SOLN
INTRAVENOUS | Status: DC
  Administered 2016-11-09: 14:00:00 via INTRAVENOUS
  Filled 2016-11-09: qty 1000

## 2016-11-09 MED ORDER — SODIUM CHLORIDE 0.9% FLUSH
10.0000 mL | INTRAVENOUS | Status: DC | PRN
  Administered 2016-11-09: 10 mL via INTRAVENOUS
  Filled 2016-11-09: qty 10

## 2016-11-09 MED ORDER — SODIUM CHLORIDE 0.9 % IV SOLN
Freq: Once | INTRAVENOUS | Status: AC
  Administered 2016-11-09: 16 mg via INTRAVENOUS
  Filled 2016-11-09: qty 8

## 2016-11-09 MED ORDER — PEGFILGRASTIM 6 MG/0.6ML ~~LOC~~ PSKT
6.0000 mg | PREFILLED_SYRINGE | Freq: Once | SUBCUTANEOUS | Status: AC
  Administered 2016-11-09: 6 mg via SUBCUTANEOUS

## 2016-11-09 MED ORDER — SODIUM CHLORIDE 0.9 % IV SOLN
1400.0000 mg | Freq: Once | INTRAVENOUS | Status: AC
  Administered 2016-11-09: 1400 mg via INTRAVENOUS
  Filled 2016-11-09: qty 50

## 2016-11-09 MED ORDER — HEPARIN SOD (PORK) LOCK FLUSH 100 UNIT/ML IV SOLN
INTRAVENOUS | Status: AC
  Filled 2016-11-09: qty 5

## 2016-11-09 MED ORDER — HEPARIN SOD (PORK) LOCK FLUSH 100 UNIT/ML IV SOLN
500.0000 [IU] | Freq: Once | INTRAVENOUS | Status: AC
  Administered 2016-11-09: 500 [IU] via INTRAVENOUS

## 2016-11-12 ENCOUNTER — Inpatient Hospital Stay: Payer: Medicare Other

## 2016-11-12 DIAGNOSIS — C8333 Diffuse large B-cell lymphoma, intra-abdominal lymph nodes: Secondary | ICD-10-CM

## 2016-11-12 LAB — CBC WITH DIFFERENTIAL/PLATELET
BASOS ABS: 0.3 10*3/uL — AB (ref 0–0.1)
Basophils Relative: 1 %
Eosinophils Absolute: 0.3 10*3/uL (ref 0–0.7)
Eosinophils Relative: 1 %
HEMATOCRIT: 32.6 % — AB (ref 35.0–47.0)
Hemoglobin: 11 g/dL — ABNORMAL LOW (ref 12.0–16.0)
LYMPHS PCT: 2 %
Lymphs Abs: 0.4 10*3/uL — ABNORMAL LOW (ref 1.0–3.6)
MCH: 31.2 pg (ref 26.0–34.0)
MCHC: 33.8 g/dL (ref 32.0–36.0)
MCV: 92.1 fL (ref 80.0–100.0)
MONO ABS: 0.1 10*3/uL — AB (ref 0.2–0.9)
Monocytes Relative: 1 %
NEUTROS ABS: 26.3 10*3/uL — AB (ref 1.4–6.5)
Neutrophils Relative %: 96 %
Platelets: 147 10*3/uL — ABNORMAL LOW (ref 150–440)
RBC: 3.54 MIL/uL — ABNORMAL LOW (ref 3.80–5.20)
RDW: 18.7 % — ABNORMAL HIGH (ref 11.5–14.5)
WBC: 27.3 10*3/uL — ABNORMAL HIGH (ref 3.6–11.0)

## 2016-11-14 ENCOUNTER — Inpatient Hospital Stay: Payer: Medicare Other

## 2016-11-14 ENCOUNTER — Inpatient Hospital Stay (HOSPITAL_BASED_OUTPATIENT_CLINIC_OR_DEPARTMENT_OTHER): Payer: Medicare Other | Admitting: Internal Medicine

## 2016-11-14 VITALS — BP 86/56 | HR 112 | Temp 99.1°F | Resp 16 | Wt 134.4 lb

## 2016-11-14 DIAGNOSIS — G629 Polyneuropathy, unspecified: Secondary | ICD-10-CM | POA: Diagnosis not present

## 2016-11-14 DIAGNOSIS — K449 Diaphragmatic hernia without obstruction or gangrene: Secondary | ICD-10-CM | POA: Diagnosis not present

## 2016-11-14 DIAGNOSIS — C8333 Diffuse large B-cell lymphoma, intra-abdominal lymph nodes: Secondary | ICD-10-CM

## 2016-11-14 DIAGNOSIS — J982 Interstitial emphysema: Secondary | ICD-10-CM | POA: Diagnosis not present

## 2016-11-14 DIAGNOSIS — K1230 Oral mucositis (ulcerative), unspecified: Secondary | ICD-10-CM | POA: Diagnosis not present

## 2016-11-14 DIAGNOSIS — R58 Hemorrhage, not elsewhere classified: Secondary | ICD-10-CM | POA: Diagnosis not present

## 2016-11-14 DIAGNOSIS — I10 Essential (primary) hypertension: Secondary | ICD-10-CM

## 2016-11-14 DIAGNOSIS — E119 Type 2 diabetes mellitus without complications: Secondary | ICD-10-CM

## 2016-11-14 DIAGNOSIS — Z8711 Personal history of peptic ulcer disease: Secondary | ICD-10-CM

## 2016-11-14 DIAGNOSIS — R0602 Shortness of breath: Secondary | ICD-10-CM

## 2016-11-14 DIAGNOSIS — K121 Other forms of stomatitis: Secondary | ICD-10-CM

## 2016-11-14 DIAGNOSIS — Z79899 Other long term (current) drug therapy: Secondary | ICD-10-CM

## 2016-11-14 DIAGNOSIS — Z7689 Persons encountering health services in other specified circumstances: Secondary | ICD-10-CM | POA: Diagnosis not present

## 2016-11-14 DIAGNOSIS — E7800 Pure hypercholesterolemia, unspecified: Secondary | ICD-10-CM

## 2016-11-14 DIAGNOSIS — R202 Paresthesia of skin: Secondary | ICD-10-CM

## 2016-11-14 DIAGNOSIS — I959 Hypotension, unspecified: Secondary | ICD-10-CM

## 2016-11-14 DIAGNOSIS — R5383 Other fatigue: Secondary | ICD-10-CM

## 2016-11-14 DIAGNOSIS — R590 Localized enlarged lymph nodes: Secondary | ICD-10-CM

## 2016-11-14 DIAGNOSIS — K5900 Constipation, unspecified: Secondary | ICD-10-CM | POA: Diagnosis not present

## 2016-11-14 DIAGNOSIS — J988 Other specified respiratory disorders: Secondary | ICD-10-CM

## 2016-11-14 DIAGNOSIS — R200 Anesthesia of skin: Secondary | ICD-10-CM

## 2016-11-14 DIAGNOSIS — Z87891 Personal history of nicotine dependence: Secondary | ICD-10-CM

## 2016-11-14 DIAGNOSIS — J939 Pneumothorax, unspecified: Secondary | ICD-10-CM

## 2016-11-14 DIAGNOSIS — Z7984 Long term (current) use of oral hypoglycemic drugs: Secondary | ICD-10-CM

## 2016-11-14 LAB — CBC WITH DIFFERENTIAL/PLATELET
BASOS ABS: 0 10*3/uL (ref 0–0.1)
Basophils Relative: 1 %
Eosinophils Absolute: 0.2 10*3/uL (ref 0–0.7)
Eosinophils Relative: 11 %
HEMATOCRIT: 29.9 % — AB (ref 35.0–47.0)
Hemoglobin: 10.3 g/dL — ABNORMAL LOW (ref 12.0–16.0)
Lymphocytes Relative: 14 %
Lymphs Abs: 0.3 10*3/uL — ABNORMAL LOW (ref 1.0–3.6)
MCH: 32.1 pg (ref 26.0–34.0)
MCHC: 34.6 g/dL (ref 32.0–36.0)
MCV: 92.9 fL (ref 80.0–100.0)
MONOS PCT: 3 %
Monocytes Absolute: 0.1 10*3/uL — ABNORMAL LOW (ref 0.2–0.9)
NEUTROS PCT: 71 %
Neutro Abs: 1.3 10*3/uL — ABNORMAL LOW (ref 1.4–6.5)
Platelets: 80 10*3/uL — ABNORMAL LOW (ref 150–440)
RBC: 3.22 MIL/uL — AB (ref 3.80–5.20)
RDW: 17.3 % — AB (ref 11.5–14.5)
WBC: 1.9 10*3/uL — AB (ref 3.6–11.0)

## 2016-11-14 NOTE — Assessment & Plan Note (Addendum)
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 # 11/01/2016 PET excellent PR; but pneumomediastinum/tiny right-sided pneumothorax. [C discussion below]. S/p  #3  of DA-EPOCH [s/p #1 cycle of R-CHOP] chemotherapy appx 1 week ago.   # No clinical progression of disease. Patient tolerated chemotherapy well- except for fatigue mild tingling and numbness   # Ecchymoses right eye-discussed with Dr. Jeni Salles; ophthalmologist conservative measures. Improving.  # Mucositis- 1-2; recommend magic mouth wash. New prescription given. Continue baking soda salt water rinses.  # Given the pneumomediastinum/ small pneumothorax right side- question related to coughing versus GI perforation/ [large hiatal hernia]. Recommend esophagus Gastrografin study to rule out any perforation. Check CXR.  # Hypotension- recommend holding bp meds; declines any IVFs. Recommend increased fluid intake.  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 Stable.    # Constipation from chemotherapy/improved. on MiraLAX prn.   # on April 9th chemo/labb; CXRs prior; for cycle #5.

## 2016-11-14 NOTE — Progress Notes (Signed)
Patient c/o leg and chest pain

## 2016-11-14 NOTE — Progress Notes (Signed)
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  female with above history of diffuse large B-cell lymphoma [triple hit] [facility] is here For follow-up. Patient received cycle #4 approximately 10 days ago.  Complains of general malaise fatigue. Denies any unusual chest pain or cough or significant shortness of breath. Complains of mild tingling and numbness in her feet. Not not any worse. No fevers or chills. No abdominal pain. Patient has mild cough. Not any worse. No swelling in the legs. Denies any difficulty swallowing or pain with swallowing. Patient has not had barium esophagogram- that she feels is too overwhelming at this time. Complains of soreness in the mouth.   That redness of the eye; has improved. No vision changes. No bleeding anywhere else.  ROS: A complete 10 point review of system is done which is negative except mentioned above in history of present illness  MEDICAL HISTORY:  Past Medical History:  Diagnosis Date  . Diabetes mellitus without complication ([place])   . History of gastric ulcer   . Hypercholesterolemia    . Hypertension   . ILD (interstitial lung disease) ([place])    8 yrs ago  . Lymphadenopathy     SURGICAL HISTORY: Past Surgical History:  Procedure Laterality Date  . BREAST BIOPSY Left 2010   neg  . HERNIA REPAIR    . PARTIAL HYSTERECTOMY     age 46  . PERIPHERAL VASCULAR CATHETERIZATION N/A 08/22/2016   Procedure: [name];  Surgeon: [name], MD;  Location: [facility];  Service: Cardiovascular;  Laterality: N/A;  . TONSILLECTOMY      SOCIAL HISTORY: quit smoking 40 years ago; human resources retd; in [place]; no alcohol Social History   Social History  . Marital status: Married    Spouse name: Rose Hill  . Number of children: 2  . Years of education: N/A   Occupational History  . Retired    Social History Main Topics  . Smoking status: Former Smoker    Quit date: 08/21/1958  . Smokeless tobacco: Never Used  . Alcohol use No  . Drug use: No  . Sexual activity: No   Other Topics Concern  . Not on file   Social History Narrative   Not applicable at this time    FAMILY HISTORY: Family History  Problem Relation Age of Onset  . Heart disease Mother   . Heart disease Father   . Heart disease Brother     ALLERGIES:  is allergic to cefuroxime axetil and doxycycline.  MEDICATIONS:  Current Outpatient Prescriptions  Medication Sig Dispense Refill  . b complex vitamins tablet Take 1 tablet by mouth daily.    Marland Kitchen gabapentin (NEURONTIN) 100 MG capsule TAKE ONE CAPSULE BY MOUTH THREE TIMES DAILY 90 capsule 3  . latanoprost (XALATAN) 0.005 % ophthalmic solution Place 1 drop into both eyes at bedtime.     . lidocaine-prilocaine (EMLA) cream Apply cream 1 hour before chemotherapy treatment, place small amount of saran wrap over cream to protect clothing. 30 g 1  . losartan-hydrochlorothiazide (HYZAAR) 100-12.5 MG tablet Take 1 tablet by mouth daily.    . Magnesium 400 MG TABS Take 1 tablet by mouth daily.    . metFORMIN (GLUCOPHAGE) 500  MG tablet Take 1 tablet by mouth daily with breakfast.     . ondansetron (ZOFRAN) 8 MG tablet TAKE ONE TABLET BY MOUTH EVERY 8 HOURS AS NEEDED FOR NAUSEA AND VOMITING (TAKE TWICE A DAY FOR THE FIRST 3 DAYS AFTER CHEMOTHERAPY) 30 tablet 0  . oxyCODONE (ROXICODONE) 5 MG immediate release tablet Take 0.5 tablets (2.5 mg total) by mouth every 6 (six) hours as needed for breakthrough pain. 60 tablet 0  . pantoprazole (PROTONIX) 40 MG tablet Take 1 tablet by mouth 2 (two) times daily.    . pravastatin (PRAVACHOL) 40 MG tablet Take 1 tablet by mouth daily.    . prochlorperazine (COMPAZINE) 10 MG tablet Take 1 tablet (10 mg total) by mouth every 6 (six) hours as needed for nausea, vomiting or refractory nausea / vomiting. 30 tablet 0  . predniSONE (DELTASONE) 50 MG tablet 2 tablets in morning; and 2 tablets in evening; take with food. Start with chemotherapy. (Patient not taking: Reported on 11/14/2016) 20 tablet 3   No current facility-administered medications for this visit.    Facility-Administered Medications Ordered in Other Visits  Medication Dose Route Frequency Provider Last Rate Last Dose  . 0.9 %  sodium chloride infusion   Intravenous Continuous Cammie Sickle, MD   Stopped at 11/05/16 1217  . heparin lock flush 100 unit/mL  500 Units Intravenous Once Cammie Sickle, MD      . sodium chloride flush (NS) 0.9 % injection 10 mL  10 mL Intravenous PRN Cammie Sickle, MD   10 mL at 11/06/16 1000  . sodium chloride flush (NS) 0.9 % injection 10 mL  10 mL Intravenous PRN Cammie Sickle, MD   10 mL at 11/07/16 0905  . sodium chloride flush (NS) 0.9 % injection 10 mL  10 mL Intravenous PRN Cammie Sickle, MD          .  PHYSICAL EXAMINATION: ECOG PERFORMANCE STATUS: 1 - Symptomatic but completely ambulatory  Vitals:   11/14/16 1009 11/14/16 1012  BP: 94/62 (!) 86/56  Pulse: (!) 114 (!) 112  Resp: 16   Temp: 99.1 F (37.3 C)    Filed Weights   11/14/16 1009   Weight: 134 lb 6 oz (61 kg)    GENERAL: Well-nourished well-developed; Alert, no distress and comfortable.   With her husband. EYES: no pallor or icterus; right eye conjunctival injection/ erythema [significant improvement since last visit] OROPHARYNX: no thrush or ulceration; good dentition  NECK: supple, no masses felt LYMPH:  no palpable lymphadenopathy in the cervical, axillary or inguinal regions LUNGS: clear to auscultation and  No wheeze or crackles HEART/CVS: regular rate & rhythm and no murmurs; No lower extremity edema ABDOMEN: abdomen soft, mild tenderness on deep palpation and normal bowel sounds. No hepatosplenomegaly Musculoskeletal:no cyanosis of digits and no  clubbing  PSYCH: alert & oriented x 3 with fluent speech NEURO: no focal motor/sensory deficits SKIN:  no rashes or significant lesions  LABORATORY DATA:  I have reviewed the data as listed Lab Results  Component Value Date   WBC 1.9 (L) 11/14/2016   HGB 10.3 (L) 11/14/2016   HCT 29.9 (L) 11/14/2016   MCV 92.9 11/14/2016   PLT 80 (L) 11/14/2016    Recent Labs  10/17/16 0957 10/22/16 1345 11/02/16 1104 11/08/16 0906  NA 137 137 137 138  K 3.1* 3.3* 3.9 3.5  CL 99* 100* 101 106  CO2 30 29 29 23   GLUCOSE 115* 119* 120* 164*  BUN 19 13 15 19   CREATININE 0.61 0.69 0.62 0.49  CALCIUM 8.5* 9.4 9.1 9.1  GFRNONAA >60 >60 >60 >60  GFRAA >60 >60 >60 >60  PROT 5.8*  --  6.7 6.1*  ALBUMIN 3.5  --  4.0 3.6  AST 20  --  16 23  ALT 16  --  12* 11*  ALKPHOS 51  --  46 32*  BILITOT 1.0  --  0.5 0.6    RADIOGRAPHIC STUDIES: I have personally reviewed the radiological images as listed and agreed with the findings in the report. Nm Cardiac Muga Rest  Result Date: 10/30/2016 CLINICAL DATA:  Diffuse large B-cell lymphoma, monitoring of cardia toxic drug therapy EXAM: NUCLEAR MEDICINE CARDIAC BLOOD POOL IMAGING (MUGA) TECHNIQUE: Cardiac multi-gated acquisition was performed at rest following intravenous injection  of Tc-72m labeled red blood cells. RADIOPHARMACEUTICALS:  23.9 mCi Tc-49m pertechnetate in-vitro labeled autologous red blood cells IV COMPARISON:  None FINDINGS: Calculated LEFT ventricular ejection fraction is 55%, normal. Study was obtained at a cardiac rate of 98 beats per minute. Patient was rhythmic during image acquisition. Wall motion analysis of the LEFT ventricle in 3 projections demonstrates normal LEFT ventricular wall motion. IMPRESSION: Normal LEFT ventricular ejection fraction of 55%. Normal LV wall motion. Electronically Signed   By: Lavonia Dana M.D.   On: 10/30/2016 11:41   Nm Pet Image Restag (ps) Skull Base To Thigh  Result Date: 11/01/2016 CLINICAL DATA:  Subsequent treatment strategy for diffuse large B-cell lymphoma of the intraabdominal lymph nodes diagnosed December 2017, status post 2 rounds of chemotherapy, presenting for restaging. EXAM: NUCLEAR MEDICINE PET SKULL BASE TO THIGH TECHNIQUE: 12.5 mCi F-18 FDG was injected intravenously. Full-ring PET imaging was performed from the skull base to thigh after the radiotracer. CT data was obtained and used for attenuation correction and anatomic localization. FASTING BLOOD GLUCOSE:  Value: 131 mg/dl COMPARISON:  08/03/2016 PET-CT. FINDINGS: NECK No hypermetabolic lymph nodes in the neck. CHEST Right internal jugular MediPort terminates at the cavoatrial junction. Left anterior descending and left circumflex coronary atherosclerosis. Atherosclerotic nonaneurysmal thoracic aorta. No pleural effusions. There is new pneumomediastinum throughout the region of the carina, which extends into the right suprahilar region. There is an associated new tiny pneumothorax in the medial upper right pleural space. No pleural effusions. No mediastinal fluid collections. No left pneumothorax. No appreciable esophageal wall thickening or gas. No pathologically enlarged or hypermetabolic axillary, mediastinal or hilar lymph nodes. No acute consolidative airspace  disease, lung masses or significant pulmonary nodules. No appreciable change in confluent patchy subpleural reticulation throughout both lungs. Suture line from prior surgical lung biopsy is again seen in the posterior right mid lung. ABDOMEN/PELVIS There is residual mild retroperitoneal lymphadenopathy predominantly in the left para-aortic chain measuring up to 1.8 cm short axis (series 3/image 149) with mild residual hypermetabolism  with max SUV 3.7, markedly reduced in size from 9.4 cm short axis on 08/03/2016 with marked reduction in hypermetabolism, previous max SUV 34.7. Previously visualized enlarged hypermetabolic left internal iliac lymph node is absent on this scan. No new pathologically enlarged or hypermetabolic abdominopelvic lymph nodes. No abnormal hypermetabolic activity within the liver, pancreas, adrenal glands, or spleen. Moderate hiatal hernia. Subcentimeter hypodense left liver lobe lesions are too small to characterize and are stable and not associated with hypermetabolism, considered benign. Stable granulomatous calcification in the right liver lobe. Normal size spleen. Atherosclerotic nonaneurysmal abdominal aorta. Hysterectomy. SKELETON No focal hypermetabolic activity to suggest skeletal metastasis. IMPRESSION: 1. New pneumomediastinum surrounding the carina extending into the right suprahilar region with new tiny pneumothorax in the medial upper right pleural space. No pleural effusions. No mediastinal fluid collections. No evidence of esophageal wall thickening or gas. These findings may represent spontaneous pneumothorax/pneumomediastinum, especially given the patient's stable chronic interstitial lung disease. Correlate clinically for any history of a vigorous cough. Short-term PA and lateral chest radiograph follow-up advised. 2. Near complete metabolic response. Retroperitoneal lymphadenopathy is markedly reduced in size and metabolism, with residual mild hypermetabolism. Left  internal iliac adenopathy has resolved. No newly enlarged or hypermetabolic lymph nodes. Normal size spleen without hypermetabolism. No marrow hypermetabolism. 3. Additional findings include aortic atherosclerosis and 2 vessel coronary atherosclerosis. Critical Value/emergent results were called by telephone at the time of interpretation on 11/01/2016 at 2:21 pm to Dr. Charlaine Dalton , who verbally acknowledged these results. Electronically Signed   By: Ilona Sorrel M.D.   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 # 11/01/2016 PET excellent PR; but pneumomediastinum/tiny right-sided pneumothorax. [C discussion below]. S/p  #3  of DA-EPOCH [s/p #1 cycle of R-CHOP] chemotherapy appx 1 week ago.   # No clinical progression of disease. Patient tolerated chemotherapy well- except for fatigue mild tingling and numbness   # Ecchymoses right eye-discussed with Dr. Jeni Salles; ophthalmologist conservative measures. Improving.  # Mucositis- 1-2; recommend magic mouth wash. New prescription given. Continue baking soda salt water rinses.  # Given the pneumomediastinum/ small pneumothorax right side- question related to coughing versus GI perforation/ [large hiatal hernia]. Recommend esophagus Gastrografin study to rule out any perforation. Check CXR.  # Hypotension- recommend holding bp meds; declines any IVFs. Recommend increased fluid intake.  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 Stable.    # Constipation from chemotherapy/improved. on MiraLAX prn.   # on April 9th chemo/labb; CXRs prior; for cycle #5.  All questions were answered. The patient knows to call the clinic with any problems, questions or concerns.    Cammie Sickle, MD 11/14/2016 1:15 PM

## 2016-11-16 ENCOUNTER — Inpatient Hospital Stay: Payer: Medicare Other

## 2016-11-16 DIAGNOSIS — C8333 Diffuse large B-cell lymphoma, intra-abdominal lymph nodes: Secondary | ICD-10-CM

## 2016-11-16 LAB — CBC WITH DIFFERENTIAL/PLATELET
BAND NEUTROPHILS: 18 %
BASOS ABS: 0 10*3/uL (ref 0–0.1)
BASOS PCT: 1 %
Eosinophils Absolute: 0.1 10*3/uL (ref 0–0.7)
Eosinophils Relative: 10 %
HEMATOCRIT: 26.9 % — AB (ref 35.0–47.0)
HEMOGLOBIN: 9.2 g/dL — AB (ref 12.0–16.0)
LYMPHS ABS: 0.5 10*3/uL — AB (ref 1.0–3.6)
Lymphocytes Relative: 38 %
MCH: 31.9 pg (ref 26.0–34.0)
MCHC: 34.3 g/dL (ref 32.0–36.0)
MCV: 93.1 fL (ref 80.0–100.0)
METAMYELOCYTES PCT: 1 %
MONOS PCT: 14 %
Monocytes Absolute: 0.2 10*3/uL (ref 0.2–0.9)
Myelocytes: 1 %
Neutro Abs: 0.4 10*3/uL — ABNORMAL LOW (ref 1.4–6.5)
Neutrophils Relative %: 17 %
Platelets: 61 10*3/uL — ABNORMAL LOW (ref 150–440)
RBC: 2.89 MIL/uL — ABNORMAL LOW (ref 3.80–5.20)
RDW: 17 % — ABNORMAL HIGH (ref 11.5–14.5)
SMEAR REVIEW: DECREASED
WBC: 1.2 10*3/uL — CL (ref 3.6–11.0)

## 2016-11-19 ENCOUNTER — Telehealth: Payer: Self-pay

## 2016-11-19 ENCOUNTER — Other Ambulatory Visit: Payer: Self-pay | Admitting: *Deleted

## 2016-11-19 ENCOUNTER — Inpatient Hospital Stay: Payer: Medicare Other | Attending: Internal Medicine

## 2016-11-19 DIAGNOSIS — R040 Epistaxis: Secondary | ICD-10-CM | POA: Insufficient documentation

## 2016-11-19 DIAGNOSIS — K449 Diaphragmatic hernia without obstruction or gangrene: Secondary | ICD-10-CM | POA: Insufficient documentation

## 2016-11-19 DIAGNOSIS — E119 Type 2 diabetes mellitus without complications: Secondary | ICD-10-CM | POA: Insufficient documentation

## 2016-11-19 DIAGNOSIS — D709 Neutropenia, unspecified: Secondary | ICD-10-CM | POA: Diagnosis not present

## 2016-11-19 DIAGNOSIS — Z7984 Long term (current) use of oral hypoglycemic drugs: Secondary | ICD-10-CM | POA: Diagnosis not present

## 2016-11-19 DIAGNOSIS — K5900 Constipation, unspecified: Secondary | ICD-10-CM | POA: Diagnosis not present

## 2016-11-19 DIAGNOSIS — R590 Localized enlarged lymph nodes: Secondary | ICD-10-CM | POA: Insufficient documentation

## 2016-11-19 DIAGNOSIS — R509 Fever, unspecified: Secondary | ICD-10-CM | POA: Insufficient documentation

## 2016-11-19 DIAGNOSIS — R110 Nausea: Secondary | ICD-10-CM | POA: Insufficient documentation

## 2016-11-19 DIAGNOSIS — Z5111 Encounter for antineoplastic chemotherapy: Secondary | ICD-10-CM | POA: Insufficient documentation

## 2016-11-19 DIAGNOSIS — G629 Polyneuropathy, unspecified: Secondary | ICD-10-CM | POA: Insufficient documentation

## 2016-11-19 DIAGNOSIS — Z8719 Personal history of other diseases of the digestive system: Secondary | ICD-10-CM | POA: Insufficient documentation

## 2016-11-19 DIAGNOSIS — J982 Interstitial emphysema: Secondary | ICD-10-CM | POA: Insufficient documentation

## 2016-11-19 DIAGNOSIS — Z7689 Persons encountering health services in other specified circumstances: Secondary | ICD-10-CM | POA: Insufficient documentation

## 2016-11-19 DIAGNOSIS — I10 Essential (primary) hypertension: Secondary | ICD-10-CM | POA: Diagnosis not present

## 2016-11-19 DIAGNOSIS — E7800 Pure hypercholesterolemia, unspecified: Secondary | ICD-10-CM | POA: Insufficient documentation

## 2016-11-19 DIAGNOSIS — J849 Interstitial pulmonary disease, unspecified: Secondary | ICD-10-CM | POA: Insufficient documentation

## 2016-11-19 DIAGNOSIS — R634 Abnormal weight loss: Secondary | ICD-10-CM | POA: Diagnosis not present

## 2016-11-19 DIAGNOSIS — J90 Pleural effusion, not elsewhere classified: Secondary | ICD-10-CM | POA: Insufficient documentation

## 2016-11-19 DIAGNOSIS — C8333 Diffuse large B-cell lymphoma, intra-abdominal lymph nodes: Secondary | ICD-10-CM | POA: Diagnosis present

## 2016-11-19 DIAGNOSIS — Z87891 Personal history of nicotine dependence: Secondary | ICD-10-CM | POA: Diagnosis not present

## 2016-11-19 DIAGNOSIS — R630 Anorexia: Secondary | ICD-10-CM | POA: Diagnosis not present

## 2016-11-19 DIAGNOSIS — R5383 Other fatigue: Secondary | ICD-10-CM | POA: Diagnosis not present

## 2016-11-19 DIAGNOSIS — K1379 Other lesions of oral mucosa: Secondary | ICD-10-CM

## 2016-11-19 DIAGNOSIS — R05 Cough: Secondary | ICD-10-CM | POA: Diagnosis not present

## 2016-11-19 DIAGNOSIS — Z79899 Other long term (current) drug therapy: Secondary | ICD-10-CM | POA: Insufficient documentation

## 2016-11-19 LAB — CBC WITH DIFFERENTIAL/PLATELET
BASOS ABS: 0.1 10*3/uL (ref 0–0.1)
BASOS PCT: 1 %
Eosinophils Absolute: 0.1 10*3/uL (ref 0–0.7)
Eosinophils Relative: 2 %
HEMATOCRIT: 28.6 % — AB (ref 35.0–47.0)
HEMOGLOBIN: 9.8 g/dL — AB (ref 12.0–16.0)
Lymphocytes Relative: 6 %
Lymphs Abs: 0.5 10*3/uL — ABNORMAL LOW (ref 1.0–3.6)
MCH: 32 pg (ref 26.0–34.0)
MCHC: 34.4 g/dL (ref 32.0–36.0)
MCV: 93 fL (ref 80.0–100.0)
MONOS PCT: 11 %
Monocytes Absolute: 0.9 10*3/uL (ref 0.2–0.9)
NEUTROS ABS: 6.8 10*3/uL — AB (ref 1.4–6.5)
NEUTROS PCT: 80 %
Platelets: 134 10*3/uL — ABNORMAL LOW (ref 150–440)
RBC: 3.07 MIL/uL — ABNORMAL LOW (ref 3.80–5.20)
RDW: 17.4 % — ABNORMAL HIGH (ref 11.5–14.5)
WBC: 8.4 10*3/uL (ref 3.6–11.0)

## 2016-11-19 MED ORDER — MAGIC MOUTHWASH W/LIDOCAINE: 5.0000 mL | Freq: Four times a day (QID) | ORAL | 3 refills | Status: DC

## 2016-11-19 NOTE — Telephone Encounter (Signed)
Patient called requesting prescription for Magic Mouthwash.  Prescription faxed to [facility] on [address].  The patient was notified.

## 2016-11-21 ENCOUNTER — Inpatient Hospital Stay: Payer: Medicare Other

## 2016-11-21 DIAGNOSIS — C8333 Diffuse large B-cell lymphoma, intra-abdominal lymph nodes: Secondary | ICD-10-CM

## 2016-11-21 LAB — CBC WITH DIFFERENTIAL/PLATELET
BASOS PCT: 1 %
Basophils Absolute: 0.1 10*3/uL (ref 0–0.1)
EOS ABS: 0.2 10*3/uL (ref 0–0.7)
Eosinophils Relative: 2 %
HCT: 28.7 % — ABNORMAL LOW (ref 35.0–47.0)
Hemoglobin: 9.8 g/dL — ABNORMAL LOW (ref 12.0–16.0)
Lymphocytes Relative: 6 %
Lymphs Abs: 0.5 10*3/uL — ABNORMAL LOW (ref 1.0–3.6)
MCH: 32 pg (ref 26.0–34.0)
MCHC: 34.1 g/dL (ref 32.0–36.0)
MCV: 93.9 fL (ref 80.0–100.0)
MONOS PCT: 15 %
Monocytes Absolute: 1.1 10*3/uL — ABNORMAL HIGH (ref 0.2–0.9)
NEUTROS PCT: 76 %
Neutro Abs: 6.1 10*3/uL (ref 1.4–6.5)
Platelets: 179 10*3/uL (ref 150–440)
RBC: 3.05 MIL/uL — ABNORMAL LOW (ref 3.80–5.20)
RDW: 17.7 % — AB (ref 11.5–14.5)
WBC: 7.9 10*3/uL (ref 3.6–11.0)

## 2016-11-22 ENCOUNTER — Ambulatory Visit
Admission: RE | Admit: 2016-11-22 | Discharge: 2016-11-22 | Disposition: A | Discharge status: Home / Self Care | Payer: Medicare Other | Source: Ambulatory Visit | Attending: Internal Medicine | Admitting: Internal Medicine

## 2016-11-22 DIAGNOSIS — K449 Diaphragmatic hernia without obstruction or gangrene: Secondary | ICD-10-CM | POA: Diagnosis not present

## 2016-11-22 DIAGNOSIS — R918 Other nonspecific abnormal finding of lung field: Secondary | ICD-10-CM | POA: Insufficient documentation

## 2016-11-22 DIAGNOSIS — J939 Pneumothorax, unspecified: Secondary | ICD-10-CM | POA: Insufficient documentation

## 2016-11-22 DIAGNOSIS — J982 Interstitial emphysema: Secondary | ICD-10-CM

## 2016-11-23 ENCOUNTER — Inpatient Hospital Stay: Payer: Medicare Other

## 2016-11-23 DIAGNOSIS — C8333 Diffuse large B-cell lymphoma, intra-abdominal lymph nodes: Secondary | ICD-10-CM

## 2016-11-23 LAB — CBC WITH DIFFERENTIAL/PLATELET
BASOS ABS: 0.1 10*3/uL (ref 0–0.1)
BASOS PCT: 1 %
Eosinophils Absolute: 0.2 10*3/uL (ref 0–0.7)
Eosinophils Relative: 2 %
HEMATOCRIT: 28.1 % — AB (ref 35.0–47.0)
HEMOGLOBIN: 9.6 g/dL — AB (ref 12.0–16.0)
LYMPHS PCT: 5 %
Lymphs Abs: 0.5 10*3/uL — ABNORMAL LOW (ref 1.0–3.6)
MCH: 32.1 pg (ref 26.0–34.0)
MCHC: 34 g/dL (ref 32.0–36.0)
MCV: 94.5 fL (ref 80.0–100.0)
MONO ABS: 1.1 10*3/uL — AB (ref 0.2–0.9)
Monocytes Relative: 13 %
NEUTROS ABS: 6.9 10*3/uL — AB (ref 1.4–6.5)
NEUTROS PCT: 79 %
Platelets: 200 10*3/uL (ref 150–440)
RBC: 2.98 MIL/uL — AB (ref 3.80–5.20)
RDW: 17.9 % — AB (ref 11.5–14.5)
WBC: 8.7 10*3/uL (ref 3.6–11.0)

## 2016-11-26 ENCOUNTER — Inpatient Hospital Stay (HOSPITAL_BASED_OUTPATIENT_CLINIC_OR_DEPARTMENT_OTHER): Payer: Medicare Other | Admitting: Internal Medicine

## 2016-11-26 ENCOUNTER — Inpatient Hospital Stay: Payer: Medicare Other

## 2016-11-26 VITALS — BP 130/78 | HR 102 | Temp 98.6°F | Resp 18 | Ht 67.0 in | Wt 132.4 lb

## 2016-11-26 DIAGNOSIS — R05 Cough: Secondary | ICD-10-CM | POA: Diagnosis not present

## 2016-11-26 DIAGNOSIS — R509 Fever, unspecified: Secondary | ICD-10-CM

## 2016-11-26 DIAGNOSIS — R590 Localized enlarged lymph nodes: Secondary | ICD-10-CM | POA: Diagnosis not present

## 2016-11-26 DIAGNOSIS — E119 Type 2 diabetes mellitus without complications: Secondary | ICD-10-CM

## 2016-11-26 DIAGNOSIS — C8333 Diffuse large B-cell lymphoma, intra-abdominal lymph nodes: Secondary | ICD-10-CM

## 2016-11-26 DIAGNOSIS — K5900 Constipation, unspecified: Secondary | ICD-10-CM

## 2016-11-26 DIAGNOSIS — I10 Essential (primary) hypertension: Secondary | ICD-10-CM | POA: Diagnosis not present

## 2016-11-26 DIAGNOSIS — K449 Diaphragmatic hernia without obstruction or gangrene: Secondary | ICD-10-CM

## 2016-11-26 DIAGNOSIS — Z79899 Other long term (current) drug therapy: Secondary | ICD-10-CM

## 2016-11-26 DIAGNOSIS — E7800 Pure hypercholesterolemia, unspecified: Secondary | ICD-10-CM

## 2016-11-26 DIAGNOSIS — Z7984 Long term (current) use of oral hypoglycemic drugs: Secondary | ICD-10-CM

## 2016-11-26 DIAGNOSIS — R5383 Other fatigue: Secondary | ICD-10-CM

## 2016-11-26 DIAGNOSIS — J849 Interstitial pulmonary disease, unspecified: Secondary | ICD-10-CM | POA: Diagnosis not present

## 2016-11-26 DIAGNOSIS — J982 Interstitial emphysema: Secondary | ICD-10-CM

## 2016-11-26 DIAGNOSIS — Z87891 Personal history of nicotine dependence: Secondary | ICD-10-CM

## 2016-11-26 DIAGNOSIS — Z8719 Personal history of other diseases of the digestive system: Secondary | ICD-10-CM

## 2016-11-26 LAB — CBC WITH DIFFERENTIAL/PLATELET
BASOS ABS: 0.1 10*3/uL (ref 0–0.1)
BASOS PCT: 2 %
EOS ABS: 0.2 10*3/uL (ref 0–0.7)
Eosinophils Relative: 3 %
HCT: 29.2 % — ABNORMAL LOW (ref 35.0–47.0)
HEMOGLOBIN: 10 g/dL — AB (ref 12.0–16.0)
Lymphocytes Relative: 8 %
Lymphs Abs: 0.5 10*3/uL — ABNORMAL LOW (ref 1.0–3.6)
MCH: 32 pg (ref 26.0–34.0)
MCHC: 34.4 g/dL (ref 32.0–36.0)
MCV: 93.1 fL (ref 80.0–100.0)
Monocytes Absolute: 1.1 10*3/uL — ABNORMAL HIGH (ref 0.2–0.9)
Monocytes Relative: 19 %
NEUTROS ABS: 3.8 10*3/uL (ref 1.4–6.5)
NEUTROS PCT: 68 %
Platelets: 243 10*3/uL (ref 150–440)
RBC: 3.13 MIL/uL — AB (ref 3.80–5.20)
RDW: 17.4 % — ABNORMAL HIGH (ref 11.5–14.5)
WBC: 5.6 10*3/uL (ref 3.6–11.0)

## 2016-11-26 LAB — COMPREHENSIVE METABOLIC PANEL
ALBUMIN: 3.4 g/dL — AB (ref 3.5–5.0)
ALK PHOS: 67 U/L (ref 38–126)
ALT: 9 U/L — AB (ref 14–54)
ANION GAP: 9 (ref 5–15)
AST: 19 U/L (ref 15–41)
BUN: 6 mg/dL (ref 6–20)
CALCIUM: 8.7 mg/dL — AB (ref 8.9–10.3)
CO2: 25 mmol/L (ref 22–32)
Chloride: 103 mmol/L (ref 101–111)
Creatinine, Ser: 0.43 mg/dL — ABNORMAL LOW (ref 0.44–1.00)
GFR calc Af Amer: >60 mL/min (ref >60)
GFR calc non Af Amer: >60 mL/min (ref >60)
GLUCOSE: 238 mg/dL — AB (ref 65–99)
Potassium: 3.4 mmol/L — ABNORMAL LOW (ref 3.5–5.1)
SODIUM: 137 mmol/L (ref 135–145)
Total Bilirubin: 0.5 mg/dL (ref 0.3–1.2)
Total Protein: 6.5 g/dL (ref 6.5–8.1)

## 2016-11-26 MED ORDER — SODIUM CHLORIDE 0.9% FLUSH
10.0000 mL | INTRAVENOUS | Status: DC | PRN
Start: 2016-11-26 — End: 2016-11-26
  Administered 2016-11-26: 10 mL via INTRAVENOUS
  Filled 2016-11-26: qty 10

## 2016-11-26 MED ORDER — HEPARIN SOD (PORK) LOCK FLUSH 100 UNIT/ML IV SOLN
INTRAVENOUS | Status: AC
  Filled 2016-11-26: qty 5

## 2016-11-26 MED ORDER — HEPARIN SOD (PORK) LOCK FLUSH 100 UNIT/ML IV SOLN
500.0000 [IU] | Freq: Once | INTRAVENOUS | Status: AC
  Administered 2016-11-26: 500 [IU] via INTRAVENOUS

## 2016-11-26 NOTE — Progress Notes (Signed)
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  female with above history of diffuse large B-cell lymphoma [triple hit] [facility] For follow-up. Patient received cycle #4 approximately 3 weeks ago.   Patient complains of fever over the weekend. She also noted to have worsening cough; no significant sputum. No hemoptysis. Denies any chest pain. No unusual shortness of breath or swelling in the legs. She has been prescribed Augmentin. Fever resolved.  Patient does complain of continued mild cough. Also complains of fatigue. Constipation or significant diarrhea. Otherwise soreness the mouth.    That redness of the eye; resolved. No vision changes. No bleeding anywhere else.  ROS: A complete 10 point review of system is done which is negative except mentioned above in history of present illness  MEDICAL HISTORY:  Past Medical History:  Diagnosis Date  . Diabetes mellitus without complication ([place])   . History of gastric ulcer   . Hypercholesterolemia   . Hypertension   . ILD (interstitial lung disease) ([place])    8 yrs ago  .  Lymphadenopathy     SURGICAL HISTORY: Past Surgical History:  Procedure Laterality Date  . BREAST BIOPSY Left 2010   neg  . HERNIA REPAIR    . PARTIAL HYSTERECTOMY     age 48  . PERIPHERAL VASCULAR CATHETERIZATION N/A 08/22/2016   Procedure: [name];  Surgeon: [name], MD;  Location: [facility];  Service: Cardiovascular;  Laterality: N/A;  . TONSILLECTOMY      SOCIAL HISTORY: quit smoking 40 years ago; human resources retd; in [place]; no alcohol Social History   Social History  . Marital status: Married    Spouse name: Glynnis Gavel  . Number of children: 2  . Years of education: N/A   Occupational History  . Retired    Social History Main Topics  . Smoking status: Former Smoker    Quit date: 08/21/1958  . Smokeless tobacco: Never Used  . Alcohol use No  . Drug use: No  . Sexual activity: No   Other Topics Concern  . Not on file   Social History Narrative   Not applicable at this time    FAMILY HISTORY: Family History  Problem Relation Age of Onset  . Heart disease Mother   . Heart disease Father   . Heart disease Brother     ALLERGIES:  is allergic to cefuroxime axetil and doxycycline.  MEDICATIONS:  Current Outpatient Prescriptions  Medication Sig Dispense Refill  . amoxicillin-clavulanate (AUGMENTIN) 875-125 MG tablet Take 1 tablet by mouth 2 (  two) times daily.  0  . b complex vitamins tablet Take 1 tablet by mouth daily.    Marland Kitchen gabapentin (NEURONTIN) 100 MG capsule TAKE ONE CAPSULE BY MOUTH THREE TIMES DAILY 90 capsule 3  . latanoprost (XALATAN) 0.005 % ophthalmic solution Place 1 drop into both eyes at bedtime.     . lidocaine-prilocaine (EMLA) cream Apply cream 1 hour before chemotherapy treatment, place small amount of saran wrap over cream to protect clothing. 30 g 1  . magic mouthwash w/lidocaine SOLN Take 5 mLs by mouth 4 (four) times daily. 80 ml viscous lidocaine 2%, 80 ml Mylanta, 80 ml Diphenhydramine 12.5 mg/5  ml Elixir, 80 ml Nystatin 100,000 Unit suspension, 80 ml Prednisolone 15 mg/15ml, 80 ml Distilled Water. Sig: Swish/Swallow 5-10 ml four times a day as needed. Dispense 480 ml. 3RFs 480 mL 3  . Magnesium 400 MG TABS Take 1 tablet by mouth daily.    . metFORMIN (GLUCOPHAGE) 500 MG tablet Take 1 tablet by mouth daily with breakfast.     . ondansetron (ZOFRAN) 8 MG tablet TAKE ONE TABLET BY MOUTH EVERY 8 HOURS AS NEEDED FOR NAUSEA AND VOMITING (TAKE TWICE A DAY FOR THE FIRST 3 DAYS AFTER CHEMOTHERAPY) 30 tablet 0  . oxyCODONE (ROXICODONE) 5 MG immediate release tablet Take 0.5 tablets (2.5 mg total) by mouth every 6 (six) hours as needed for breakthrough pain. 60 tablet 0  . pantoprazole (PROTONIX) 40 MG tablet Take 1 tablet by mouth 2 (two) times daily.    . pravastatin (PRAVACHOL) 40 MG tablet Take 1 tablet by mouth daily.    . prochlorperazine (COMPAZINE) 10 MG tablet Take 1 tablet (10 mg total) by mouth every 6 (six) hours as needed for nausea, vomiting or refractory nausea / vomiting. 30 tablet 0  . losartan-hydrochlorothiazide (HYZAAR) 100-12.5 MG tablet Take 1 tablet by mouth daily.    . predniSONE (DELTASONE) 50 MG tablet 2 tablets in morning; and 2 tablets in evening; take with food. Start with chemotherapy. (Patient not taking: Reported on 11/14/2016) 20 tablet 3   No current facility-administered medications for this visit.    Facility-Administered Medications Ordered in Other Visits  Medication Dose Route Frequency Provider Last Rate Last Dose  . 0.9 %  sodium chloride infusion   Intravenous Continuous Cammie Sickle, MD   Stopped at 11/05/16 1217  . heparin lock flush 100 unit/mL  500 Units Intravenous Once Cammie Sickle, MD      . sodium chloride flush (NS) 0.9 % injection 10 mL  10 mL Intravenous PRN Cammie Sickle, MD   10 mL at 11/06/16 1000  . sodium chloride flush (NS) 0.9 % injection 10 mL  10 mL Intravenous PRN Cammie Sickle, MD   10 mL at 11/07/16 0905  .  sodium chloride flush (NS) 0.9 % injection 10 mL  10 mL Intravenous PRN Cammie Sickle, MD          .  PHYSICAL EXAMINATION: ECOG PERFORMANCE STATUS: 1 - Symptomatic but completely ambulatory  Vitals:   11/26/16 0831  BP: 130/78  Pulse: (!) 102  Resp: 18  Temp: 98.6 F (37 C)   Filed Weights   11/26/16 0831  Weight: 132 lb 6.4 oz (60.1 kg)    GENERAL: Well-nourished well-developed; Alert, no distress and comfortable.   With her husband. EYES: no pallor or icterus;  OROPHARYNX: no thrush or ulceration; good dentition  NECK: supple, no masses felt LYMPH:  no palpable lymphadenopathy in the cervical,  axillary or inguinal regions LUNGS: clear to auscultation and  No wheeze or crackles HEART/CVS: regular rate & rhythm and no murmurs; No lower extremity edema ABDOMEN: abdomen soft, no tenderness; normal bowel sounds.  PSYCH: alert & oriented x 3 with fluent speech NEURO: no focal motor/sensory deficits SKIN:  no rashes or significant lesions  LABORATORY DATA:  I have reviewed the data as listed Lab Results  Component Value Date   WBC 5.6 11/26/2016   HGB 10.0 (L) 11/26/2016   HCT 29.2 (L) 11/26/2016   MCV 93.1 11/26/2016   PLT 243 11/26/2016    Recent Labs  11/02/16 1104 11/08/16 0906 11/26/16 0752  NA 137 138 137  K 3.9 3.5 3.4*  CL 101 106 103  CO2 29 23 25   GLUCOSE 120* 164* 238*  BUN 15 19 6   CREATININE 0.62 0.49 0.43*  CALCIUM 9.1 9.1 8.7*  GFRNONAA >60 >60 >60  GFRAA >60 >60 >60  PROT 6.7 6.1* 6.5  ALBUMIN 4.0 3.6 3.4*  AST 16 23 19   ALT 12* 11* 9*  ALKPHOS 46 32* 67  BILITOT 0.5 0.6 0.5    RADIOGRAPHIC STUDIES: I have personally reviewed the radiological images as listed and agreed with the findings in the report. Dg Chest 2 View  Result Date: 11/22/2016 CLINICAL DATA:  History of previous pneumomediastinum, followup on the also history of interstitial lung disease EXAM: CHEST  2 VIEW COMPARISON:  PET-CT of 11/01/2016 and portable chest  x-ray of 06/29/2010 FINDINGS: The pneumomediastinum noted on PET-CT is no longer seen. However there are coarse and prominent interstitial markings consistent with pulmonary fibrosis and possibly interstitial lung disease. No pneumonia or effusion is seen. Mediastinal and hilar contours are unremarkable. The heart is within normal limits in size. A small to moderate size hiatal hernia is present. Right-sided Port-A-Cath is noted with the tip overlying the mid SVC. No bony abnormality is seen. IMPRESSION: 1. No evidence of residual pneumomediastinum is seen. 2. Prominent coarse interstitial lung markings consistent with pulmonary fibrosis and possibly interstitial lung disease. 3. Hiatal hernia. Electronically Signed   By: Ivar Drape M.D.   On: 11/22/2016 10:08   Nm Cardiac Muga Rest  Result Date: 10/30/2016 CLINICAL DATA:  Diffuse large B-cell lymphoma, monitoring of cardia toxic drug therapy EXAM: NUCLEAR MEDICINE CARDIAC BLOOD POOL IMAGING (MUGA) TECHNIQUE: Cardiac multi-gated acquisition was performed at rest following intravenous injection of Tc-48m labeled red blood cells. RADIOPHARMACEUTICALS:  23.9 mCi Tc-33m pertechnetate in-vitro labeled autologous red blood cells IV COMPARISON:  None FINDINGS: Calculated LEFT ventricular ejection fraction is 55%, normal. Study was obtained at a cardiac rate of 98 beats per minute. Patient was rhythmic during image acquisition. Wall motion analysis of the LEFT ventricle in 3 projections demonstrates normal LEFT ventricular wall motion. IMPRESSION: Normal LEFT ventricular ejection fraction of 55%. Normal LV wall motion. Electronically Signed   By: Lavonia Dana M.D.   On: 10/30/2016 11:41   Nm Pet Image Restag (ps) Skull Base To Thigh  Result Date: 11/01/2016 CLINICAL DATA:  Subsequent treatment strategy for diffuse large B-cell lymphoma of the intraabdominal lymph nodes diagnosed December 2017, status post 2 rounds of chemotherapy, presenting for restaging. EXAM:  NUCLEAR MEDICINE PET SKULL BASE TO THIGH TECHNIQUE: 12.5 mCi F-18 FDG was injected intravenously. Full-ring PET imaging was performed from the skull base to thigh after the radiotracer. CT data was obtained and used for attenuation correction and anatomic localization. FASTING BLOOD GLUCOSE:  Value: 131 mg/dl COMPARISON:  08/03/2016 PET-CT. FINDINGS: NECK  No hypermetabolic lymph nodes in the neck. CHEST Right internal jugular MediPort terminates at the cavoatrial junction. Left anterior descending and left circumflex coronary atherosclerosis. Atherosclerotic nonaneurysmal thoracic aorta. No pleural effusions. There is new pneumomediastinum throughout the region of the carina, which extends into the right suprahilar region. There is an associated new tiny pneumothorax in the medial upper right pleural space. No pleural effusions. No mediastinal fluid collections. No left pneumothorax. No appreciable esophageal wall thickening or gas. No pathologically enlarged or hypermetabolic axillary, mediastinal or hilar lymph nodes. No acute consolidative airspace disease, lung masses or significant pulmonary nodules. No appreciable change in confluent patchy subpleural reticulation throughout both lungs. Suture line from prior surgical lung biopsy is again seen in the posterior right mid lung. ABDOMEN/PELVIS There is residual mild retroperitoneal lymphadenopathy predominantly in the left para-aortic chain measuring up to 1.8 cm short axis (series 3/image 149) with mild residual hypermetabolism with max SUV 3.7, markedly reduced in size from 9.4 cm short axis on 08/03/2016 with marked reduction in hypermetabolism, previous max SUV 34.7. Previously visualized enlarged hypermetabolic left internal iliac lymph node is absent on this scan. No new pathologically enlarged or hypermetabolic abdominopelvic lymph nodes. No abnormal hypermetabolic activity within the liver, pancreas, adrenal glands, or spleen. Moderate hiatal hernia.  Subcentimeter hypodense left liver lobe lesions are too small to characterize and are stable and not associated with hypermetabolism, considered benign. Stable granulomatous calcification in the right liver lobe. Normal size spleen. Atherosclerotic nonaneurysmal abdominal aorta. Hysterectomy. SKELETON No focal hypermetabolic activity to suggest skeletal metastasis. IMPRESSION: 1. New pneumomediastinum surrounding the carina extending into the right suprahilar region with new tiny pneumothorax in the medial upper right pleural space. No pleural effusions. No mediastinal fluid collections. No evidence of esophageal wall thickening or gas. These findings may represent spontaneous pneumothorax/pneumomediastinum, especially given the patient's stable chronic interstitial lung disease. Correlate clinically for any history of a vigorous cough. Short-term PA and lateral chest radiograph follow-up advised. 2. Near complete metabolic response. Retroperitoneal lymphadenopathy is markedly reduced in size and metabolism, with residual mild hypermetabolism. Left internal iliac adenopathy has resolved. No newly enlarged or hypermetabolic lymph nodes. Normal size spleen without hypermetabolism. No marrow hypermetabolism. 3. Additional findings include aortic atherosclerosis and 2 vessel coronary atherosclerosis. Critical Value/emergent results were called by telephone at the time of interpretation on 11/01/2016 at 2:21 pm to Dr. Charlaine Dalton , who verbally acknowledged these results. Electronically Signed   By: Ilona Sorrel M.D.   On: 11/01/2016 14:34   Dg Esophagus W/water Sol Cm  Result Date: 11/22/2016 CLINICAL DATA:  Unexplained pneumomediastinum and tiny left pleural effusion. EXAM: ESOPHOGRAM/BARIUM SWALLOW TECHNIQUE: Single contrast examination was performed using water soluble contrast. FLUOROSCOPY TIME:  Fluoroscopy Time:  0 minutes, 36 seconds Radiation Exposure Index (if provided by the fluoroscopic device): 1989  micro Gy per meters square Number of Acquired Spot Images: Multiple video loops. COMPARISON:  Chest x-ray of November 22, 2016 FINDINGS: The patient ingested the water-soluble contrast without difficulty. No pain was reported. The cervical and upper and mid thoracic esophagus appeared normal. The contour of the distal third of the esophagus was irregular with prominent tertiary contractions. A small area of dilation was observed with somewhat delayed emptying but emptying did occur spontaneously and again no pain was felt with the swallowing maneuver. No extraluminal contrast was observed. There was a small non reducible hiatal hernia. IMPRESSION: No evidence of an esophageal leak. Tortuosity of the lumen of the distal third of the esophagus. Small non  reducible hiatal hernia. The patient experienced no pain during the swallowing maneuvers. Electronically Signed   By: David  Martinique M.D.   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 11/01/2016 PET excellent PR; but pneumomediastinum/tiny right-sided pneumothorax. [C discussion below]. S/p  #3  of DA-EPOCH [s/p #1 cycle of R-CHOP] chemotherapy appx 3 weeks ago.   # No clinical progression of disease; tolerating chemo okay- except for fever/ fatigue.  # HOLD chemo today- [fever/ fatigue- see discussion below]. We'll reevaluate in 1 week; and re-start chemo.   # Fevers/cough. Sinusitis versus bronchitis. Improved on Augmentin. Finish a 7 day course.  # Ecchymoses right eye- resolved.   # Mucositis- 1-2;resolved.  # Given the pneumomediastinum/ small pneumothorax right side- question related to coughing-  repeat chest x-ray gastrografin study esophagogram normal  # PN G-1- likely from vincristine. Stable.    # Constipation from chemotherapy/improved. on MiraLAX prn.   # on April 16th th  chemo/lab;  for cycle #5.  All questions were answered. The patient knows to call the clinic with any problems, questions or concerns.    Cammie Sickle, MD 11/26/2016 8:56 AM

## 2016-11-26 NOTE — Assessment & Plan Note (Addendum)
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 11/01/2016 PET excellent PR; but pneumomediastinum/tiny right-sided pneumothorax. [C discussion below]. S/p  #3  of DA-EPOCH [s/p #1 cycle of R-CHOP] chemotherapy appx 3 weeks ago.   # No clinical progression of disease; tolerating chemo okay- except for fever/ fatigue.  # HOLD chemo today- [fever/ fatigue- see discussion below]. We'll reevaluate in 1 week; and re-start chemo.   # Fevers/cough. Sinusitis versus bronchitis. Improved on Augmentin. Finish a 7 day course.  # Ecchymoses right eye- resolved.   # Mucositis- 1-2;resolved.  # Given the pneumomediastinum/ small pneumothorax right side- question related to coughing-  repeat chest x-ray gastrografin study esophagogram normal  # PN G-1- likely from vincristine. Stable.    # Constipation from chemotherapy/improved. on MiraLAX prn.   # on April 16th th chemo/lab;  for cycle #5.

## 2016-11-26 NOTE — Progress Notes (Signed)
Patient here for follow-up and chemotherapy treatments. She states that she had a fever of 100.7 this weekend. She contacted on call provider and was prescribed amoxicillin. She states she did not her steroids prior to the apt today.

## 2016-11-27 ENCOUNTER — Inpatient Hospital Stay: Payer: Medicare Other

## 2016-11-28 ENCOUNTER — Inpatient Hospital Stay: Payer: Medicare Other

## 2016-11-29 ENCOUNTER — Inpatient Hospital Stay: Payer: Medicare Other

## 2016-11-30 ENCOUNTER — Inpatient Hospital Stay: Payer: Medicare Other

## 2016-12-03 ENCOUNTER — Inpatient Hospital Stay: Payer: Medicare Other

## 2016-12-03 ENCOUNTER — Inpatient Hospital Stay (HOSPITAL_BASED_OUTPATIENT_CLINIC_OR_DEPARTMENT_OTHER): Payer: Medicare Other | Admitting: Internal Medicine

## 2016-12-03 VITALS — BP 124/77 | HR 110 | Temp 98.9°F | Wt 128.2 lb

## 2016-12-03 DIAGNOSIS — Z7984 Long term (current) use of oral hypoglycemic drugs: Secondary | ICD-10-CM

## 2016-12-03 DIAGNOSIS — R5383 Other fatigue: Secondary | ICD-10-CM

## 2016-12-03 DIAGNOSIS — K5900 Constipation, unspecified: Secondary | ICD-10-CM | POA: Diagnosis not present

## 2016-12-03 DIAGNOSIS — C8333 Diffuse large B-cell lymphoma, intra-abdominal lymph nodes: Secondary | ICD-10-CM

## 2016-12-03 DIAGNOSIS — R05 Cough: Secondary | ICD-10-CM

## 2016-12-03 DIAGNOSIS — J982 Interstitial emphysema: Secondary | ICD-10-CM

## 2016-12-03 DIAGNOSIS — R509 Fever, unspecified: Secondary | ICD-10-CM | POA: Diagnosis not present

## 2016-12-03 DIAGNOSIS — E7800 Pure hypercholesterolemia, unspecified: Secondary | ICD-10-CM | POA: Diagnosis not present

## 2016-12-03 DIAGNOSIS — R197 Diarrhea, unspecified: Secondary | ICD-10-CM

## 2016-12-03 DIAGNOSIS — R630 Anorexia: Secondary | ICD-10-CM

## 2016-12-03 DIAGNOSIS — I10 Essential (primary) hypertension: Secondary | ICD-10-CM

## 2016-12-03 DIAGNOSIS — Z8719 Personal history of other diseases of the digestive system: Secondary | ICD-10-CM

## 2016-12-03 DIAGNOSIS — Z87891 Personal history of nicotine dependence: Secondary | ICD-10-CM

## 2016-12-03 DIAGNOSIS — J849 Interstitial pulmonary disease, unspecified: Secondary | ICD-10-CM | POA: Diagnosis not present

## 2016-12-03 DIAGNOSIS — R590 Localized enlarged lymph nodes: Secondary | ICD-10-CM

## 2016-12-03 DIAGNOSIS — E119 Type 2 diabetes mellitus without complications: Secondary | ICD-10-CM

## 2016-12-03 DIAGNOSIS — R110 Nausea: Secondary | ICD-10-CM | POA: Diagnosis not present

## 2016-12-03 DIAGNOSIS — R634 Abnormal weight loss: Secondary | ICD-10-CM | POA: Diagnosis not present

## 2016-12-03 DIAGNOSIS — Z79899 Other long term (current) drug therapy: Secondary | ICD-10-CM

## 2016-12-03 DIAGNOSIS — K449 Diaphragmatic hernia without obstruction or gangrene: Secondary | ICD-10-CM

## 2016-12-03 LAB — CBC WITH DIFFERENTIAL/PLATELET
BASOS ABS: 0 10*3/uL (ref 0–0.1)
BASOS PCT: 1 %
EOS ABS: 0.3 10*3/uL (ref 0–0.7)
Eosinophils Relative: 4 %
HEMATOCRIT: 31 % — AB (ref 35.0–47.0)
Hemoglobin: 10.6 g/dL — ABNORMAL LOW (ref 12.0–16.0)
Lymphocytes Relative: 8 %
Lymphs Abs: 0.5 10*3/uL — ABNORMAL LOW (ref 1.0–3.6)
MCH: 31.3 pg (ref 26.0–34.0)
MCHC: 34.2 g/dL (ref 32.0–36.0)
MCV: 91.6 fL (ref 80.0–100.0)
MONO ABS: 1.1 10*3/uL — AB (ref 0.2–0.9)
Monocytes Relative: 16 %
NEUTROS ABS: 5.1 10*3/uL (ref 1.4–6.5)
Neutrophils Relative %: 71 %
Platelets: 328 10*3/uL (ref 150–440)
RBC: 3.38 MIL/uL — ABNORMAL LOW (ref 3.80–5.20)
RDW: 16.3 % — ABNORMAL HIGH (ref 11.5–14.5)
WBC: 7.1 10*3/uL (ref 3.6–11.0)

## 2016-12-03 LAB — COMPREHENSIVE METABOLIC PANEL
ALBUMIN: 3.6 g/dL (ref 3.5–5.0)
ALK PHOS: 57 U/L (ref 38–126)
ALT: 9 U/L — AB (ref 14–54)
AST: 16 U/L (ref 15–41)
Anion gap: 9 (ref 5–15)
BILIRUBIN TOTAL: 0.3 mg/dL (ref 0.3–1.2)
BUN: 8 mg/dL (ref 6–20)
CALCIUM: 9.2 mg/dL (ref 8.9–10.3)
CO2: 27 mmol/L (ref 22–32)
Chloride: 102 mmol/L (ref 101–111)
Creatinine, Ser: 0.34 mg/dL — ABNORMAL LOW (ref 0.44–1.00)
GFR calc Af Amer: >60 mL/min (ref >60)
GFR calc non Af Amer: >60 mL/min (ref >60)
GLUCOSE: 162 mg/dL — AB (ref 65–99)
Potassium: 3.3 mmol/L — ABNORMAL LOW (ref 3.5–5.1)
Sodium: 138 mmol/L (ref 135–145)
TOTAL PROTEIN: 6.6 g/dL (ref 6.5–8.1)

## 2016-12-03 MED ORDER — DIPHENHYDRAMINE HCL 25 MG PO CAPS
50.0000 mg | ORAL_CAPSULE | Freq: Once | ORAL | Status: AC
  Administered 2016-12-03: 50 mg via ORAL
  Filled 2016-12-03: qty 2

## 2016-12-03 MED ORDER — SODIUM CHLORIDE 0.9 % IV SOLN
INTRAVENOUS | Status: DC
  Administered 2016-12-03: 10:00:00 via INTRAVENOUS
  Filled 2016-12-03: qty 1000

## 2016-12-03 MED ORDER — SODIUM CHLORIDE 0.9 % IV SOLN
Freq: Once | INTRAVENOUS | Status: AC
  Administered 2016-12-03: 8 mg via INTRAVENOUS
  Filled 2016-12-03: qty 4

## 2016-12-03 MED ORDER — SODIUM CHLORIDE 0.9% FLUSH
10.0000 mL | INTRAVENOUS | Status: DC | PRN
  Filled 2016-12-03: qty 10

## 2016-12-03 MED ORDER — VINCRISTINE SULFATE CHEMO INJECTION 1 MG/ML
Freq: Once | INTRAVENOUS | Status: AC
  Administered 2016-12-03: 14:00:00 via INTRAVENOUS
  Filled 2016-12-03: qty 11

## 2016-12-03 MED ORDER — SODIUM CHLORIDE 0.9 % IV SOLN: 375.0000 mg/m2 | Freq: Once | INTRAVENOUS | Status: DC

## 2016-12-03 MED ORDER — SODIUM CHLORIDE 0.9 % IV SOLN
700.0000 mg | Freq: Once | INTRAVENOUS | Status: AC
Start: 2016-12-03 — End: 2016-12-03
  Administered 2016-12-03: 700 mg via INTRAVENOUS
  Filled 2016-12-03: qty 50

## 2016-12-03 MED ORDER — HEPARIN SOD (PORK) LOCK FLUSH 100 UNIT/ML IV SOLN: 500.0000 [IU] | Freq: Once | INTRAVENOUS | Status: DC | PRN

## 2016-12-03 MED ORDER — ACETAMINOPHEN 325 MG PO TABS
650.0000 mg | ORAL_TABLET | Freq: Once | ORAL | Status: AC
  Administered 2016-12-03: 650 mg via ORAL
  Filled 2016-12-03: qty 2

## 2016-12-03 NOTE — Progress Notes (Unsigned)
Pt self administered her home prescription of PO prednisone in the chemo suite.

## 2016-12-03 NOTE — Progress Notes (Signed)
Patient here today for follow up.   

## 2016-12-03 NOTE — Progress Notes (Signed)
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  female with above history of diffuse large B-cell lymphoma [triple hit] [facility] is here For follow-up. Patient received cycle #4 approximately 4 weeks ago.   Chemotherapy was held last week because of "bronchitis/sinusitis"- treated with Augmentin. Symptoms improved.  Patient denies any fevers. She complains of intermittent nausea. She had diarrhea up to 3 stools 3-4 days ago. Currently resolved. Poor appetite. Lost some weight.  Otherwise denies any worsening cough or hemoptysis. Denies any chest pain. No unusual shortness of breath or swelling in the legs.   ROS: A complete 10 point review of system is done which is negative except mentioned above in history of present illness  MEDICAL HISTORY:  Past Medical History:  Diagnosis Date  . Diabetes mellitus without complication ([place])   . History of gastric ulcer   . Hypercholesterolemia   . Hypertension   . ILD (interstitial lung disease) ([place])    8 yrs ago  . Lymphadenopathy     SURGICAL HISTORY: Past Surgical History:   Procedure Laterality Date  . BREAST BIOPSY Left 2010   neg  . HERNIA REPAIR    . PARTIAL HYSTERECTOMY     age 18  . PERIPHERAL VASCULAR CATHETERIZATION N/A 08/22/2016   Procedure: [place] Cath Insertion;  Surgeon: [name], MD;  Location: [place] [facility];  Service: Cardiovascular;  Laterality: N/A;  . TONSILLECTOMY      SOCIAL HISTORY: quit smoking 40 years ago; human resources retd; in [place]; no alcohol Social History   Social History  . Marital status: Married    Spouse name: Rose Hill  . Number of children: 2  . Years of education: N/A   Occupational History  . Retired    Social History Main Topics  . Smoking status: Former Smoker    Quit date: 08/21/1958  . Smokeless tobacco: Never Used  . Alcohol use No  . Drug use: No  . Sexual activity: No   Other Topics Concern  . Not on file   Social History Narrative   Not applicable at this time    FAMILY HISTORY: Family History  Problem Relation Age of Onset  . Heart disease Mother   . Heart disease Father   . Heart disease Brother     ALLERGIES:  is allergic to cefuroxime axetil and doxycycline.  MEDICATIONS:  Current Outpatient Prescriptions  Medication Sig Dispense Refill  . b complex vitamins tablet Take 1 tablet by mouth daily.    Marland Kitchen gabapentin (NEURONTIN) 100 MG capsule TAKE ONE CAPSULE BY MOUTH  THREE TIMES DAILY 90 capsule 3  . latanoprost (XALATAN) 0.005 % ophthalmic solution Place 1 drop into both eyes at bedtime.     . lidocaine-prilocaine (EMLA) cream Apply cream 1 hour before chemotherapy treatment, place small amount of saran wrap over cream to protect clothing. 30 g 1  . magic mouthwash w/lidocaine SOLN Take 5 mLs by mouth 4 (four) times daily. 80 ml viscous lidocaine 2%, 80 ml Mylanta, 80 ml Diphenhydramine 12.5 mg/5 ml Elixir, 80 ml Nystatin 100,000 Unit suspension, 80 ml Prednisolone 15 mg/5ml, 80 ml Distilled Water. Sig: Swish/Swallow 5-10 ml four times a day as needed. Dispense 480  ml. 3RFs 480 mL 3  . Magnesium 400 MG TABS Take 1 tablet by mouth daily.    . metFORMIN (GLUCOPHAGE) 500 MG tablet Take 1 tablet by mouth daily with breakfast.     . ondansetron (ZOFRAN) 8 MG tablet TAKE ONE TABLET BY MOUTH EVERY 8 HOURS AS NEEDED FOR NAUSEA AND VOMITING (TAKE TWICE A DAY FOR THE FIRST 3 DAYS AFTER CHEMOTHERAPY) 30 tablet 0  . oxyCODONE (ROXICODONE) 5 MG immediate release tablet Take 0.5 tablets (2.5 mg total) by mouth every 6 (six) hours as needed for breakthrough pain. 60 tablet 0  . pantoprazole (PROTONIX) 40 MG tablet Take 1 tablet by mouth 2 (two) times daily.    . pravastatin (PRAVACHOL) 40 MG tablet Take 1 tablet by mouth daily.    . predniSONE (DELTASONE) 50 MG tablet 2 tablets in morning; and 2 tablets in evening; take with food. Start with chemotherapy. 20 tablet 3  . prochlorperazine (COMPAZINE) 10 MG tablet Take 1 tablet (10 mg total) by mouth every 6 (six) hours as needed for nausea, vomiting or refractory nausea / vomiting. 30 tablet 0  . losartan-hydrochlorothiazide (HYZAAR) 100-12.5 MG tablet Take 1 tablet by mouth daily.     No current facility-administered medications for this visit.    Facility-Administered Medications Ordered in Other Visits  Medication Dose Route Frequency Provider Last Rate Last Dose  . 0.9 %  sodium chloride infusion   Intravenous Continuous Cammie Sickle, MD   Stopped at 11/05/16 1217  . 0.9 %  sodium chloride infusion   Intravenous Continuous Cammie Sickle, MD 20 mL/hr at 12/03/16 1025    . DOXOrubicin (ADRIAMYCIN) 22 mg, etoposide (VEPESID) 106 mg, vinCRIStine (ONCOVIN) 0.8 mg in sodium chloride 0.9 % 500 mL chemo infusion   Intravenous Once Cammie Sickle, MD      . heparin lock flush 100 unit/mL  500 Units Intravenous Once Cammie Sickle, MD      . heparin lock flush 100 unit/mL  500 Units Intracatheter Once PRN Cammie Sickle, MD      . sodium chloride flush (NS) 0.9 % injection 10 mL  10 mL Intravenous  PRN Cammie Sickle, MD   10 mL at 11/06/16 1000  . sodium chloride flush (NS) 0.9 % injection 10 mL  10 mL Intravenous PRN Cammie Sickle, MD   10 mL at 11/07/16 0905  . sodium chloride flush (NS) 0.9 % injection 10 mL  10 mL Intravenous PRN Cammie Sickle, MD      . sodium chloride flush (NS) 0.9 % injection 10 mL  10 mL Intracatheter PRN Cammie Sickle, MD          .  PHYSICAL EXAMINATION: ECOG PERFORMANCE STATUS: 1 - Symptomatic but completely ambulatory  Vitals:   12/03/16 0952  BP: 124/77  Pulse: (!) 110  Temp:  98.9 F (37.2 C)   Filed Weights   12/03/16 0952  Weight: 128 lb 4 oz (58.2 kg)    GENERAL: Well-nourished well-developed; Alert, no distress and comfortable.   With her husband. EYES: no pallor or icterus;  OROPHARYNX: no thrush or ulceration; good dentition  NECK: supple, no masses felt LYMPH:  no palpable lymphadenopathy in the cervical, axillary or inguinal regions LUNGS: Positive for crackles at the bases auscultation and  No wheeze.  HEART/CVS: regular rate & rhythm and no murmurs; No lower extremity edema ABDOMEN: abdomen soft, no tenderness; normal bowel sounds.  PSYCH: alert & oriented x 3 with fluent speech NEURO: no focal motor/sensory deficits SKIN:  no rashes or significant lesions  LABORATORY DATA:  I have reviewed the data as listed Lab Results  Component Value Date   WBC 7.1 12/03/2016   HGB 10.6 (L) 12/03/2016   HCT 31.0 (L) 12/03/2016   MCV 91.6 12/03/2016   PLT 328 12/03/2016    Recent Labs  11/08/16 0906 11/26/16 0752 12/03/16 0903  NA 138 137 138  K 3.5 3.4* 3.3*  CL 106 103 102  CO2 23 25 27   GLUCOSE 164* 238* 162*  BUN 19 6 8   CREATININE 0.49 0.43* 0.34*  CALCIUM 9.1 8.7* 9.2  GFRNONAA >60 >60 >60  GFRAA >60 >60 >60  PROT 6.1* 6.5 6.6  ALBUMIN 3.6 3.4* 3.6  AST 23 19 16   ALT 11* 9* 9*  ALKPHOS 32* 67 57  BILITOT 0.6 0.5 0.3    RADIOGRAPHIC STUDIES: I have personally reviewed the  radiological images as listed and agreed with the findings in the report. Dg Chest 2 View  Result Date: 11/22/2016 CLINICAL DATA:  History of previous pneumomediastinum, followup on the also history of interstitial lung disease EXAM: CHEST  2 VIEW COMPARISON:  PET-CT of 11/01/2016 and portable chest x-ray of 06/29/2010 FINDINGS: The pneumomediastinum noted on PET-CT is no longer seen. However there are coarse and prominent interstitial markings consistent with pulmonary fibrosis and possibly interstitial lung disease. No pneumonia or effusion is seen. Mediastinal and hilar contours are unremarkable. The heart is within normal limits in size. A small to moderate size hiatal hernia is present. Right-sided Port-A-Cath is noted with the tip overlying the mid SVC. No bony abnormality is seen. IMPRESSION: 1. No evidence of residual pneumomediastinum is seen. 2. Prominent coarse interstitial lung markings consistent with pulmonary fibrosis and possibly interstitial lung disease. 3. Hiatal hernia. Electronically Signed   By: Ivar Drape M.D.   On: 11/22/2016 10:08   Dg Esophagus W/water Sol Cm  Result Date: 11/22/2016 CLINICAL DATA:  Unexplained pneumomediastinum and tiny left pleural effusion. EXAM: ESOPHOGRAM/BARIUM SWALLOW TECHNIQUE: Single contrast examination was performed using water soluble contrast. FLUOROSCOPY TIME:  Fluoroscopy Time:  0 minutes, 36 seconds Radiation Exposure Index (if provided by the fluoroscopic device): 1989 micro Gy per meters square Number of Acquired Spot Images: Multiple video loops. COMPARISON:  Chest x-ray of November 22, 2016 FINDINGS: The patient ingested the water-soluble contrast without difficulty. No pain was reported. The cervical and upper and mid thoracic esophagus appeared normal. The contour of the distal third of the esophagus was irregular with prominent tertiary contractions. A small area of dilation was observed with somewhat delayed emptying but emptying did occur  spontaneously and again no pain was felt with the swallowing maneuver. No extraluminal contrast was observed. There was a small non reducible hiatal hernia. IMPRESSION: No evidence of an esophageal leak. Tortuosity of the lumen of the  distal third of the esophagus. Small non reducible hiatal hernia. The patient experienced no pain during the swallowing maneuvers. Electronically Signed   By: David  Martinique M.D.   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 11/01/2016 PET excellent PR; but pneumomediastinum/tiny right-sided pneumothorax. [C discussion below]. S/p  #3  of DA-EPOCH [s/p #1 cycle of R-CHOP] chemotherapy appx 4 weeks ago. No signs of clinical progression.  # Proceed with chemo today; labs reviewed today.   # Fevers/cough. Sinusitis versus bronchitis. Improved - s/p on Augmentin.   # Nausea/ no headaches/ weight loss-See Jolie.   # Diarrhea last week; resolved. Stool sample for c.diff/ GI PCR.   # Given the pneumomediastinum/ small pneumothorax right side- question related to coughing-  repeat chest x-ray gastrografin study esophagogram normal  # PN G-1- likely from vincristine. Stable.     # follow up in 10 days; labs- twice a week.   All questions were answered. The patient knows to call the clinic with any problems, questions or concerns.    Cammie Sickle, MD 12/03/2016 12:18 PM

## 2016-12-03 NOTE — Assessment & Plan Note (Addendum)
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 11/01/2016 PET excellent PR; but pneumomediastinum/tiny right-sided pneumothorax. [C discussion below]. S/p  #3  of DA-EPOCH [s/p #1 cycle of R-CHOP] chemotherapy appx 4 weeks ago. No signs of clinical progression.  # Proceed with chemo today; labs reviewed today.   # Fevers/cough. Sinusitis versus bronchitis. Improved - s/p on Augmentin.   # Nausea/ no headaches/ weight loss-See Jolie.   # Diarrhea last week; resolved. Stool sample for c.diff/ GI PCR.   # Given the pneumomediastinum/ small pneumothorax right side- question related to coughing-  repeat chest x-ray gastrografin study esophagogram normal  # PN G-1- likely from vincristine. Stable.     # follow up in 10 days; labs- twice a week.

## 2016-12-04 ENCOUNTER — Other Ambulatory Visit: Payer: Self-pay | Admitting: *Deleted

## 2016-12-04 ENCOUNTER — Inpatient Hospital Stay: Payer: Medicare Other

## 2016-12-04 VITALS — BP 122/68 | HR 87 | Temp 97.8°F | Resp 18

## 2016-12-04 DIAGNOSIS — C8333 Diffuse large B-cell lymphoma, intra-abdominal lymph nodes: Secondary | ICD-10-CM | POA: Diagnosis not present

## 2016-12-04 MED ORDER — VINCRISTINE SULFATE CHEMO INJECTION 1 MG/ML
Freq: Once | INTRAVENOUS | Status: AC
  Administered 2016-12-04: 12:00:00 via INTRAVENOUS
  Filled 2016-12-04: qty 11

## 2016-12-04 MED ORDER — SODIUM CHLORIDE 0.9 % IV SOLN
Freq: Once | INTRAVENOUS | Status: AC
  Administered 2016-12-04: 8 mg via INTRAVENOUS
  Filled 2016-12-04: qty 4

## 2016-12-05 ENCOUNTER — Inpatient Hospital Stay: Payer: Medicare Other

## 2016-12-05 DIAGNOSIS — C8333 Diffuse large B-cell lymphoma, intra-abdominal lymph nodes: Secondary | ICD-10-CM

## 2016-12-05 MED ORDER — SODIUM CHLORIDE 0.9 % IV SOLN
Freq: Once | INTRAVENOUS | Status: AC
  Administered 2016-12-05: 10 mg via INTRAVENOUS
  Filled 2016-12-05: qty 4

## 2016-12-05 MED ORDER — SODIUM CHLORIDE 0.9 % IV SOLN
INTRAVENOUS | Status: AC
  Administered 2016-12-05: 11:00:00 via INTRAVENOUS
  Filled 2016-12-05 (×2): qty 1000

## 2016-12-05 MED ORDER — VINCRISTINE SULFATE CHEMO INJECTION 1 MG/ML
Freq: Once | INTRAVENOUS | Status: AC
  Administered 2016-12-05: 11:00:00 via INTRAVENOUS
  Filled 2016-12-05: qty 11

## 2016-12-05 MED ORDER — SODIUM CHLORIDE 0.9% FLUSH
10.0000 mL | INTRAVENOUS | Status: AC | PRN
  Administered 2016-12-05: 10 mL via INTRAVENOUS
  Filled 2016-12-05: qty 10

## 2016-12-06 ENCOUNTER — Inpatient Hospital Stay: Payer: Medicare Other

## 2016-12-06 VITALS — BP 113/83 | HR 65 | Temp 97.8°F | Resp 18

## 2016-12-06 DIAGNOSIS — C8333 Diffuse large B-cell lymphoma, intra-abdominal lymph nodes: Secondary | ICD-10-CM | POA: Diagnosis not present

## 2016-12-06 MED ORDER — VINCRISTINE SULFATE CHEMO INJECTION 1 MG/ML
Freq: Once | INTRAVENOUS | Status: AC
  Administered 2016-12-06: 10:00:00 via INTRAVENOUS
  Filled 2016-12-06: qty 11

## 2016-12-06 MED ORDER — ONDANSETRON HCL 40 MG/20ML IJ SOLN
Freq: Once | INTRAMUSCULAR | Status: AC
  Administered 2016-12-06: 8 mg via INTRAVENOUS
  Filled 2016-12-06: qty 4

## 2016-12-06 NOTE — Progress Notes (Signed)
Nutrition Assessment   Reason for Assessment:   Referral for poor appetite, weight loss. Patient is followed by Dr. B  ASSESSMENT:  71 year old female with B-cell lymphoma receiving chemotherapy. Patient seen in infusion suite this am.  Past medical history of DM, gastric ulcer, hypercholesterolemia, HTN, interstitial lung disease.  Patient reports about 1 week ago had very poor appetite due to sinusitis/bronchitis was on antibiotics. Also think she had bad eggs which caused diarrhea.  Reports nausea and vomiting at times with chemotherapy. Does not take nausea medication as prescribed. Patient reports she typically drinks ensure enlive 1-2 per day and sometimes thinks that causes diarrhea  Nutrition Focused Physical Exam: Nutrition-Focused physical exam completed. Findings are moderate fat depletion,  moderate -severe  muscle depletion, and no edema.    Medications: magic mouthwash, Mag, metformin, zofran, protonix, predinsone, compazine  Labs: K 3.3, glucose 162  Anthropometrics:   Height: 67 inches Weight: 128 lb UBW: 153-155 lb Jan 2018 BMI: 20  16% weight loss in the last 3 months, significant   Estimated Energy Needs  Kcals: 1700-2000 calories/d Protein: 70-87 g/d Fluid: 2 L/d  NUTRITION DIAGNOSIS: Inadequate food and beverage intake related to cancer and cancer related treatments as evidenced by    MALNUTRITION DIAGNOSIS: Patient meets criteria for severe malnutrition in context of chronic illness as evidenced by 16% weight loss in 3 months, moderate-severe fat and muscle mass depletion   INTERVENTION:   Encouraged patient to take nausea/vomiting/diarrhea medication to help control side effects. Discussed nutrition strategies to help with nausea, vomiting and diarrhea.  Fact sheets provided.  Teach back used. Provided samples of ensure enlive, boost breeze for patient to try along with coupons.    MONITORING, EVALUATION, GOAL: Patient will consume adequate  calories and protein to meet nutritional needs and prevent further weight loss   NEXT VISIT: with infusion treatments  Sharanya Templin B. [name], [place], [place] Registered Dietitian [phone] (pager)

## 2016-12-07 ENCOUNTER — Inpatient Hospital Stay: Payer: Medicare Other

## 2016-12-07 VITALS — BP 109/57 | HR 62 | Temp 96.0°F | Resp 20

## 2016-12-07 DIAGNOSIS — C8333 Diffuse large B-cell lymphoma, intra-abdominal lymph nodes: Secondary | ICD-10-CM

## 2016-12-07 MED ORDER — SODIUM CHLORIDE 0.9 % IV SOLN
1400.0000 mg | Freq: Once | INTRAVENOUS | Status: AC
  Administered 2016-12-07: 1400 mg via INTRAVENOUS
  Filled 2016-12-07: qty 50

## 2016-12-07 MED ORDER — PEGFILGRASTIM 6 MG/0.6ML ~~LOC~~ PSKT
6.0000 mg | PREFILLED_SYRINGE | Freq: Once | SUBCUTANEOUS | Status: AC
  Administered 2016-12-07: 6 mg via SUBCUTANEOUS

## 2016-12-07 MED ORDER — SODIUM CHLORIDE 0.9 % IV SOLN
INTRAVENOUS | Status: DC
  Administered 2016-12-07: 09:00:00 via INTRAVENOUS
  Filled 2016-12-07: qty 1000

## 2016-12-07 MED ORDER — SODIUM CHLORIDE 0.9 % IV SOLN
Freq: Once | INTRAVENOUS | Status: AC
  Administered 2016-12-07: 16 mg via INTRAVENOUS
  Filled 2016-12-07: qty 8

## 2016-12-07 MED ORDER — HEPARIN SOD (PORK) LOCK FLUSH 100 UNIT/ML IV SOLN
500.0000 [IU] | Freq: Once | INTRAVENOUS | Status: AC
  Administered 2016-12-07: 500 [IU] via INTRAVENOUS
  Filled 2016-12-07: qty 5

## 2016-12-07 MED ORDER — SODIUM CHLORIDE 0.9% FLUSH
10.0000 mL | Freq: Once | INTRAVENOUS | Status: AC
  Administered 2016-12-07: 10 mL via INTRAVENOUS
  Filled 2016-12-07: qty 10

## 2016-12-08 ENCOUNTER — Other Ambulatory Visit: Payer: Self-pay | Admitting: Internal Medicine

## 2016-12-08 DIAGNOSIS — T451X5A Adverse effect of antineoplastic and immunosuppressive drugs, initial encounter: Principal | ICD-10-CM

## 2016-12-08 DIAGNOSIS — R110 Nausea: Secondary | ICD-10-CM

## 2016-12-08 DIAGNOSIS — C8333 Diffuse large B-cell lymphoma, intra-abdominal lymph nodes: Secondary | ICD-10-CM

## 2016-12-11 ENCOUNTER — Inpatient Hospital Stay: Payer: Medicare Other

## 2016-12-11 DIAGNOSIS — C8333 Diffuse large B-cell lymphoma, intra-abdominal lymph nodes: Secondary | ICD-10-CM | POA: Diagnosis not present

## 2016-12-11 LAB — CBC WITH DIFFERENTIAL/PLATELET
BASOS ABS: 0 10*3/uL (ref 0–0.1)
BASOS PCT: 0 %
EOS ABS: 0.2 10*3/uL (ref 0–0.7)
EOS PCT: 3 %
HCT: 27.6 % — ABNORMAL LOW (ref 35.0–47.0)
Hemoglobin: 9.5 g/dL — ABNORMAL LOW (ref 12.0–16.0)
Lymphocytes Relative: 3 %
Lymphs Abs: 0.2 10*3/uL — ABNORMAL LOW (ref 1.0–3.6)
MCH: 31.3 pg (ref 26.0–34.0)
MCHC: 34.5 g/dL (ref 32.0–36.0)
MCV: 90.9 fL (ref 80.0–100.0)
MONOS PCT: 0 %
Monocytes Absolute: 0 10*3/uL — ABNORMAL LOW (ref 0.2–0.9)
Neutro Abs: 5.7 10*3/uL (ref 1.4–6.5)
Neutrophils Relative %: 94 %
PLATELETS: 103 10*3/uL — AB (ref 150–440)
RBC: 3.03 MIL/uL — ABNORMAL LOW (ref 3.80–5.20)
RDW: 15.9 % — ABNORMAL HIGH (ref 11.5–14.5)
WBC: 6.1 10*3/uL (ref 3.6–11.0)

## 2016-12-13 ENCOUNTER — Inpatient Hospital Stay: Payer: Medicare Other

## 2016-12-13 ENCOUNTER — Inpatient Hospital Stay (HOSPITAL_BASED_OUTPATIENT_CLINIC_OR_DEPARTMENT_OTHER): Payer: Medicare Other | Admitting: Internal Medicine

## 2016-12-13 VITALS — BP 113/61 | HR 111 | Temp 99.8°F | Wt 129.5 lb

## 2016-12-13 DIAGNOSIS — J849 Interstitial pulmonary disease, unspecified: Secondary | ICD-10-CM

## 2016-12-13 DIAGNOSIS — R590 Localized enlarged lymph nodes: Secondary | ICD-10-CM | POA: Diagnosis not present

## 2016-12-13 DIAGNOSIS — J90 Pleural effusion, not elsewhere classified: Secondary | ICD-10-CM | POA: Diagnosis not present

## 2016-12-13 DIAGNOSIS — C8333 Diffuse large B-cell lymphoma, intra-abdominal lymph nodes: Secondary | ICD-10-CM | POA: Diagnosis not present

## 2016-12-13 DIAGNOSIS — Z8719 Personal history of other diseases of the digestive system: Secondary | ICD-10-CM

## 2016-12-13 DIAGNOSIS — J982 Interstitial emphysema: Secondary | ICD-10-CM

## 2016-12-13 DIAGNOSIS — E7800 Pure hypercholesterolemia, unspecified: Secondary | ICD-10-CM

## 2016-12-13 DIAGNOSIS — Z7984 Long term (current) use of oral hypoglycemic drugs: Secondary | ICD-10-CM

## 2016-12-13 DIAGNOSIS — G629 Polyneuropathy, unspecified: Secondary | ICD-10-CM

## 2016-12-13 DIAGNOSIS — D709 Neutropenia, unspecified: Secondary | ICD-10-CM

## 2016-12-13 DIAGNOSIS — E119 Type 2 diabetes mellitus without complications: Secondary | ICD-10-CM | POA: Diagnosis not present

## 2016-12-13 DIAGNOSIS — Z79899 Other long term (current) drug therapy: Secondary | ICD-10-CM

## 2016-12-13 DIAGNOSIS — R040 Epistaxis: Secondary | ICD-10-CM | POA: Diagnosis not present

## 2016-12-13 DIAGNOSIS — Z87891 Personal history of nicotine dependence: Secondary | ICD-10-CM

## 2016-12-13 DIAGNOSIS — K449 Diaphragmatic hernia without obstruction or gangrene: Secondary | ICD-10-CM

## 2016-12-13 DIAGNOSIS — I10 Essential (primary) hypertension: Secondary | ICD-10-CM | POA: Diagnosis not present

## 2016-12-13 DIAGNOSIS — R5383 Other fatigue: Secondary | ICD-10-CM

## 2016-12-13 LAB — CBC WITH DIFFERENTIAL/PLATELET
BASOS PCT: 4 %
Basophils Absolute: 0 10*3/uL (ref 0–0.1)
Eosinophils Absolute: 0.1 10*3/uL (ref 0–0.7)
Eosinophils Relative: 25 %
HEMATOCRIT: 26.3 % — AB (ref 35.0–47.0)
HEMOGLOBIN: 9.1 g/dL — AB (ref 12.0–16.0)
LYMPHS ABS: 0.1 10*3/uL — AB (ref 1.0–3.6)
LYMPHS PCT: 22 %
MCH: 31.2 pg (ref 26.0–34.0)
MCHC: 34.7 g/dL (ref 32.0–36.0)
MCV: 89.9 fL (ref 80.0–100.0)
MONOS PCT: 13 %
Monocytes Absolute: 0.1 10*3/uL — ABNORMAL LOW (ref 0.2–0.9)
NEUTROS ABS: 0.2 10*3/uL — AB (ref 1.4–6.5)
NEUTROS PCT: 36 %
Platelets: 50 10*3/uL — ABNORMAL LOW (ref 150–440)
RBC: 2.92 MIL/uL — ABNORMAL LOW (ref 3.80–5.20)
RDW: 15.9 % — AB (ref 11.5–14.5)
WBC: 0.5 10*3/uL — CL (ref 3.6–11.0)

## 2016-12-13 MED ORDER — LEVOFLOXACIN 500 MG PO TABS: 500.0000 mg | ORAL_TABLET | Freq: Every day | ORAL | 0 refills | Status: DC

## 2016-12-13 NOTE — Progress Notes (Signed)
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  female with above history of diffuse large B-cell lymphoma [triple hit] [facility] For follow-up. Patient received cycle #5 approximately 10 days ago.   Complains of fatigue. Weight is stable. She has met with dietitian. Patient denies any fevers. Otherwise denies any worsening cough or hemoptysis. Denies any unusual chest pain. No unusual shortness of breath or swelling in the legs. Chronic mild tingling and numbness in the extremity is not any worse. Patient had a few episodes of epistaxis after blowing her nose.  ROS: A complete 10 point review of system is done which is negative except mentioned above in history of present illness  MEDICAL HISTORY:  Past Medical History:  Diagnosis Date  . Diabetes mellitus without complication ([place])   . History of gastric ulcer   . Hypercholesterolemia   . Hypertension   . ILD (interstitial lung disease) ([place])    8 yrs ago  . Lymphadenopathy     SURGICAL HISTORY: Past Surgical History:  Procedure Laterality Date  . BREAST BIOPSY Left  2010   neg  . HERNIA REPAIR    . PARTIAL HYSTERECTOMY     age 78  . PERIPHERAL VASCULAR CATHETERIZATION N/A 08/22/2016   Procedure: [name];  Surgeon: [name], MD;  Location: [facility];  Service: Cardiovascular;  Laterality: N/A;  . TONSILLECTOMY      SOCIAL HISTORY: quit smoking 40 years ago; human resources retd; in [place]; no alcohol Social History   Social History  . Marital status: Married    Spouse name: Shelvie Salsberry  . Number of children: 2  . Years of education: N/A   Occupational History  . Retired    Social History Main Topics  . Smoking status: Former Smoker    Quit date: 08/21/1958  . Smokeless tobacco: Never Used  . Alcohol use No  . Drug use: No  . Sexual activity: No   Other Topics Concern  . Not on file   Social History Narrative   Not applicable at this time    FAMILY HISTORY: Family History  Problem Relation Age of Onset  . Heart disease Mother   . Heart disease Father   . Heart disease Brother     ALLERGIES:  is allergic to cefuroxime axetil and doxycycline.  MEDICATIONS:  Current Outpatient Prescriptions  Medication Sig Dispense Refill  . b complex vitamins tablet Take 1 tablet by mouth daily.    Marland Kitchen gabapentin (NEURONTIN) 100 MG capsule TAKE ONE CAPSULE BY MOUTH THREE TIMES DAILY  90 capsule 3  . latanoprost (XALATAN) 0.005 % ophthalmic solution Place 1 drop into both eyes at bedtime.     . lidocaine-prilocaine (EMLA) cream Apply cream 1 hour before chemotherapy treatment, place small amount of saran wrap over cream to protect clothing. 30 g 1  . magic mouthwash w/lidocaine SOLN Take 5 mLs by mouth 4 (four) times daily. 80 ml viscous lidocaine 2%, 80 ml Mylanta, 80 ml Diphenhydramine 12.5 mg/5 ml Elixir, 80 ml Nystatin 100,000 Unit suspension, 80 ml Prednisolone 15 mg/26m, 80 ml Distilled Water. Sig: Swish/Swallow 5-10 ml four times a day as needed. Dispense 480 ml. 3RFs 480 mL 3  . Magnesium 400 MG TABS Take 1  tablet by mouth daily.    . metFORMIN (GLUCOPHAGE) 500 MG tablet Take 1 tablet by mouth daily with breakfast.     . ondansetron (ZOFRAN) 8 MG tablet TAKE 1 TABLET BY MOUTH EVERY 8 HOURS AS NEEDED FOR NAUSEA AND VOMITING (TAKE TWICE DAILY FOR THE FIRST 3 DAYS AFTER CHEMOTHERAPY) 30 tablet 0  . oxyCODONE (ROXICODONE) 5 MG immediate release tablet Take 0.5 tablets (2.5 mg total) by mouth every 6 (six) hours as needed for breakthrough pain. 60 tablet 0  . pantoprazole (PROTONIX) 40 MG tablet Take 1 tablet by mouth 2 (two) times daily.    . pravastatin (PRAVACHOL) 40 MG tablet Take 1 tablet by mouth daily.    . prochlorperazine (COMPAZINE) 10 MG tablet Take 1 tablet (10 mg total) by mouth every 6 (six) hours as needed for nausea, vomiting or refractory nausea / vomiting. 30 tablet 0  . levofloxacin (LEVAQUIN) 500 MG tablet Take 1 tablet (500 mg total) by mouth daily. 10 tablet 0  . losartan-hydrochlorothiazide (HYZAAR) 100-12.5 MG tablet Take 1 tablet by mouth daily.    . predniSONE (DELTASONE) 50 MG tablet 2 tablets in morning; and 2 tablets in evening; take with food. Start with chemotherapy. (Patient not taking: Reported on 12/13/2016) 20 tablet 3   No current facility-administered medications for this visit.    Facility-Administered Medications Ordered in Other Visits  Medication Dose Route Frequency Provider Last Rate Last Dose  . 0.9 %  sodium chloride infusion   Intravenous Continuous GCammie Sickle MD   Stopped at 11/05/16 1217  . 0.9 %  sodium chloride infusion   Intravenous Continuous GCammie Sickle MD   Stopped at 12/03/16 1345  . 0.9 %  sodium chloride infusion   Intravenous Continuous GCammie Sickle MD 10 mL/hr at 12/05/16 1030    . heparin lock flush 100 unit/mL  500 Units Intravenous Once GCammie Sickle MD      . heparin lock flush 100 unit/mL  500 Units Intracatheter Once PRN GCammie Sickle MD      . sodium chloride flush (NS) 0.9 % injection 10 mL  10  mL Intravenous PRN GCammie Sickle MD   10 mL at 11/06/16 1000  . sodium chloride flush (NS) 0.9 % injection 10 mL  10 mL Intravenous PRN GCammie Sickle MD   10 mL at 11/07/16 0905  . sodium chloride flush (NS) 0.9 % injection 10 mL  10 mL Intravenous PRN GCammie Sickle MD      . sodium chloride flush (NS) 0.9 % injection 10 mL  10 mL Intracatheter PRN GCammie Sickle MD      . sodium chloride flush (NS) 0.9 % injection 10 mL  10 mL Intravenous PRN GCammie Sickle MD   10 mL at  12/05/16 1030      .  PHYSICAL EXAMINATION: ECOG PERFORMANCE STATUS: 1 - Symptomatic but completely ambulatory  Vitals:   12/13/16 0829  BP: 113/61  Pulse: (!) 111  Temp: 99.8 F (37.7 C)   Filed Weights   12/13/16 0829  Weight: 129 lb 8 oz (58.7 kg)    GENERAL: Well-nourished well-developed; Alert, no distress and comfortable.   With her husband. EYES: no pallor or icterus;  OROPHARYNX: no thrush or ulceration; good dentition  NECK: supple, no masses felt LYMPH:  no palpable lymphadenopathy in the cervical, axillary or inguinal regions LUNGS: Positive for crackles at the bases auscultation and  No wheeze.  HEART/CVS: regular rate & rhythm and no murmurs; No lower extremity edema ABDOMEN: abdomen soft, no tenderness; normal bowel sounds.  PSYCH: alert & oriented x 3 with fluent speech NEURO: no focal motor/sensory deficits SKIN:  no rashes or significant lesions  LABORATORY DATA:  I have reviewed the data as listed Lab Results  Component Value Date   WBC 0.5 (LL) 12/13/2016   HGB 9.1 (L) 12/13/2016   HCT 26.3 (L) 12/13/2016   MCV 89.9 12/13/2016   PLT 50 (L) 12/13/2016    Recent Labs  11/08/16 0906 11/26/16 0752 12/03/16 0903  NA 138 137 138  K 3.5 3.4* 3.3*  CL 106 103 102  CO2 _0 GLUCOSE 164* 238* 162*  BUN _1 CREATININE 0.49 0.43* 0.34*  CALCIUM 9.1 8.7* 9.2  GFRNONAA >60 >60 >60  GFRAA >60 >60 >60  PROT 6.1* 6.5 6.6  ALBUMIN 3.6  3.4* 3.6  AST _2 ALT 11* 9* 9*  ALKPHOS 32* 67 57  BILITOT 0.6 0.5 0.3    RADIOGRAPHIC STUDIES: I have personally reviewed the radiological images as listed and agreed with the findings in the report. Dg Chest 2 View  Result Date: 11/22/2016 CLINICAL DATA:  History of previous pneumomediastinum, followup on the also history of interstitial lung disease EXAM: CHEST  2 VIEW COMPARISON:  PET-CT of 11/01/2016 and portable chest x-ray of 06/29/2010 FINDINGS: The pneumomediastinum noted on PET-CT is no longer seen. However there are coarse and prominent interstitial markings consistent with pulmonary fibrosis and possibly interstitial lung disease. No pneumonia or effusion is seen. Mediastinal and hilar contours are unremarkable. The heart is within normal limits in size. A small to moderate size hiatal hernia is present. Right-sided Port-A-Cath is noted with the tip overlying the mid SVC. No bony abnormality is seen. IMPRESSION: 1. No evidence of residual pneumomediastinum is seen. 2. Prominent coarse interstitial lung markings consistent with pulmonary fibrosis and possibly interstitial lung disease. 3. Hiatal hernia. Electronically Signed   By: Ivar Drape M.D.   On: 11/22/2016 10:08   Dg Esophagus W/water Sol Cm  Result Date: 11/22/2016 CLINICAL DATA:  Unexplained pneumomediastinum and tiny left pleural effusion. EXAM: ESOPHOGRAM/BARIUM SWALLOW TECHNIQUE: Single contrast examination was performed using water soluble contrast. FLUOROSCOPY TIME:  Fluoroscopy Time:  0 minutes, 36 seconds Radiation Exposure Index (if provided by the fluoroscopic device): 1989 micro Gy per meters square Number of Acquired Spot Images: Multiple video loops. COMPARISON:  Chest x-ray of November 22, 2016 FINDINGS: The patient ingested the water-soluble contrast without difficulty. No pain was reported. The cervical and upper and mid thoracic esophagus appeared normal. The contour of the distal third of the esophagus was  irregular with prominent tertiary contractions. A small area of dilation was observed with somewhat delayed emptying but emptying did occur  spontaneously and again no pain was felt with the swallowing maneuver. No extraluminal contrast was observed. There was a small non reducible hiatal hernia. IMPRESSION: No evidence of an esophageal leak. Tortuosity of the lumen of the distal third of the esophagus. Small non reducible hiatal hernia. The patient experienced no pain during the swallowing maneuvers. Electronically Signed   By: David  Martinique M.D.   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 11/01/2016 PET excellent PR.   # S/p  #4  of DA-EPOCH [s/p #1 cycle of R-CHOP] chemotherapy appx 10 ago. No signs of clinical progression.discussed the possible ned for consolidation radaition given the largest size of the primary mass  # neutropenia/thrombocytopenia- sec to chemo s/p neulasta.  Discussed neutropenic precautions. New prescription for Levaquin given; not to use it unless she has fevers; she calls before using it.   # Nosebleed-not spontaneous- platelet count 50,000. Recommend nasal saline sprays. Monitor counts next week.  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 Stable.     # follow up on May 7th/chemo/labs. Labs next week.  All questions were answered. The patient knows to call the clinic with any problems, questions or concerns.    Cammie Sickle, MD 12/13/2016 3:35 PM

## 2016-12-13 NOTE — Progress Notes (Signed)
Patient here today for follow up.   

## 2016-12-13 NOTE — Assessment & Plan Note (Addendum)
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 11/01/2016 PET excellent PR.   # S/p  #4  of DA-EPOCH [s/p #1 cycle of R-CHOP] chemotherapy appx 10 ago. No signs of clinical progression.discussed the possible ned for consolidation radaition given the largest size of the primary mass  # neutropenia/thrombocytopenia- sec to chemo s/p neulasta.  Discussed neutropenic precautions. New prescription for Levaquin given; not to use it unless she has fevers; she calls before using it.   # Nosebleed-not spontaneous- platelet count 50,000. Recommend nasal saline sprays. Monitor counts next week.  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 Stable.     # follow up on May 7th/chemo/labs. Labs next week.

## 2016-12-17 ENCOUNTER — Inpatient Hospital Stay: Payer: Medicare Other

## 2016-12-17 DIAGNOSIS — C8333 Diffuse large B-cell lymphoma, intra-abdominal lymph nodes: Secondary | ICD-10-CM

## 2016-12-17 LAB — CBC WITH DIFFERENTIAL/PLATELET
Basophils Absolute: 0.1 10*3/uL (ref 0–0.1)
Basophils Relative: 1 %
EOS ABS: 0.2 10*3/uL (ref 0–0.7)
EOS PCT: 2 %
HCT: 27.3 % — ABNORMAL LOW (ref 35.0–47.0)
Hemoglobin: 9.2 g/dL — ABNORMAL LOW (ref 12.0–16.0)
Lymphocytes Relative: 6 %
Lymphs Abs: 0.4 10*3/uL — ABNORMAL LOW (ref 1.0–3.6)
MCH: 30.4 pg (ref 26.0–34.0)
MCHC: 33.9 g/dL (ref 32.0–36.0)
MCV: 89.7 fL (ref 80.0–100.0)
Monocytes Absolute: 1.1 10*3/uL — ABNORMAL HIGH (ref 0.2–0.9)
Monocytes Relative: 16 %
Neutro Abs: 5 10*3/uL (ref 1.4–6.5)
Neutrophils Relative %: 75 %
PLATELETS: 144 10*3/uL — AB (ref 150–440)
RBC: 3.04 MIL/uL — AB (ref 3.80–5.20)
RDW: 16.4 % — AB (ref 11.5–14.5)
WBC: 6.7 10*3/uL (ref 3.6–11.0)

## 2016-12-18 ENCOUNTER — Telehealth: Payer: Self-pay | Admitting: *Deleted

## 2016-12-18 ENCOUNTER — Ambulatory Visit
Admission: RE | Admit: 2016-12-18 | Discharge: 2016-12-18 | Disposition: A | Discharge status: Home / Self Care | Payer: Medicare Other | Source: Ambulatory Visit | Attending: Internal Medicine | Admitting: Internal Medicine

## 2016-12-18 DIAGNOSIS — R059 Cough, unspecified: Secondary | ICD-10-CM

## 2016-12-18 DIAGNOSIS — R05 Cough: Secondary | ICD-10-CM | POA: Insufficient documentation

## 2016-12-18 DIAGNOSIS — I700 Atherosclerosis of aorta: Secondary | ICD-10-CM | POA: Diagnosis not present

## 2016-12-18 NOTE — Telephone Encounter (Signed)
Called to report that she has been running a "fever" from 99.6 - 100.1. Asking if she needs abx. Last chemo was 12/07/16. Labs yesterday showed WBC 6.7, ANC 5.0. Reports she has been coughing clear mucous up since yesterday. No other symptoms. Please advise

## 2016-12-18 NOTE — Telephone Encounter (Signed)
Per Dr Rogue Bussing:  Patient okay to take Levaquin once daily for 7 days (pt already has this rx).  Patient needs to get chest xray, I will order, please notify pt.

## 2016-12-18 NOTE — Telephone Encounter (Signed)
Patient informed to fill Levaquin and to go get a CXR, she asked if seh could get CXR tomorrow, I told her today is preferable, but if not then tomorrow AM

## 2016-12-19 ENCOUNTER — Telehealth: Payer: Self-pay | Admitting: *Deleted

## 2016-12-19 NOTE — Telephone Encounter (Signed)
Per Dr Rogue Bussing stop the Levaquin, no other Abx to be ordered at this time. Patient advised of this and also to monitor her temp and notify us if it goes 100.5 or greater

## 2016-12-19 NOTE — Telephone Encounter (Signed)
Started taking Levaquin yesterday and is having burning in bilateral legs from knees down on the inside aspect, also having a more irregular heartbeat Stats the only difference is the Levaquin. Advised to stop it until she hears back from me. Please advise

## 2016-12-20 ENCOUNTER — Inpatient Hospital Stay: Payer: Medicare Other | Attending: Internal Medicine

## 2016-12-20 DIAGNOSIS — E119 Type 2 diabetes mellitus without complications: Secondary | ICD-10-CM | POA: Insufficient documentation

## 2016-12-20 DIAGNOSIS — R509 Fever, unspecified: Secondary | ICD-10-CM | POA: Insufficient documentation

## 2016-12-20 DIAGNOSIS — I10 Essential (primary) hypertension: Secondary | ICD-10-CM | POA: Diagnosis not present

## 2016-12-20 DIAGNOSIS — R531 Weakness: Secondary | ICD-10-CM | POA: Insufficient documentation

## 2016-12-20 DIAGNOSIS — Z8711 Personal history of peptic ulcer disease: Secondary | ICD-10-CM | POA: Insufficient documentation

## 2016-12-20 DIAGNOSIS — J8410 Pulmonary fibrosis, unspecified: Secondary | ICD-10-CM | POA: Diagnosis not present

## 2016-12-20 DIAGNOSIS — R05 Cough: Secondary | ICD-10-CM | POA: Diagnosis not present

## 2016-12-20 DIAGNOSIS — R634 Abnormal weight loss: Secondary | ICD-10-CM | POA: Diagnosis not present

## 2016-12-20 DIAGNOSIS — R262 Difficulty in walking, not elsewhere classified: Secondary | ICD-10-CM | POA: Insufficient documentation

## 2016-12-20 DIAGNOSIS — Z5111 Encounter for antineoplastic chemotherapy: Secondary | ICD-10-CM | POA: Diagnosis not present

## 2016-12-20 DIAGNOSIS — R002 Palpitations: Secondary | ICD-10-CM | POA: Insufficient documentation

## 2016-12-20 DIAGNOSIS — R000 Tachycardia, unspecified: Secondary | ICD-10-CM | POA: Diagnosis not present

## 2016-12-20 DIAGNOSIS — I2510 Atherosclerotic heart disease of native coronary artery without angina pectoris: Secondary | ICD-10-CM | POA: Insufficient documentation

## 2016-12-20 DIAGNOSIS — M21371 Foot drop, right foot: Secondary | ICD-10-CM | POA: Diagnosis not present

## 2016-12-20 DIAGNOSIS — N200 Calculus of kidney: Secondary | ICD-10-CM | POA: Insufficient documentation

## 2016-12-20 DIAGNOSIS — C8333 Diffuse large B-cell lymphoma, intra-abdominal lymph nodes: Secondary | ICD-10-CM | POA: Diagnosis not present

## 2016-12-20 DIAGNOSIS — E876 Hypokalemia: Secondary | ICD-10-CM | POA: Diagnosis not present

## 2016-12-20 DIAGNOSIS — Z87891 Personal history of nicotine dependence: Secondary | ICD-10-CM | POA: Diagnosis not present

## 2016-12-20 DIAGNOSIS — R200 Anesthesia of skin: Secondary | ICD-10-CM | POA: Insufficient documentation

## 2016-12-20 DIAGNOSIS — R5383 Other fatigue: Secondary | ICD-10-CM | POA: Diagnosis not present

## 2016-12-20 DIAGNOSIS — E7800 Pure hypercholesterolemia, unspecified: Secondary | ICD-10-CM | POA: Diagnosis not present

## 2016-12-20 DIAGNOSIS — M21372 Foot drop, left foot: Secondary | ICD-10-CM | POA: Diagnosis not present

## 2016-12-20 DIAGNOSIS — Z79899 Other long term (current) drug therapy: Secondary | ICD-10-CM | POA: Insufficient documentation

## 2016-12-20 DIAGNOSIS — I700 Atherosclerosis of aorta: Secondary | ICD-10-CM | POA: Diagnosis not present

## 2016-12-20 DIAGNOSIS — K449 Diaphragmatic hernia without obstruction or gangrene: Secondary | ICD-10-CM | POA: Insufficient documentation

## 2016-12-20 DIAGNOSIS — Z7984 Long term (current) use of oral hypoglycemic drugs: Secondary | ICD-10-CM | POA: Diagnosis not present

## 2016-12-20 LAB — CBC WITH DIFFERENTIAL/PLATELET
BASOS ABS: 0.1 10*3/uL (ref 0–0.1)
BASOS PCT: 1 %
EOS ABS: 0.2 10*3/uL (ref 0–0.7)
Eosinophils Relative: 2 %
HCT: 28.3 % — ABNORMAL LOW (ref 35.0–47.0)
HEMOGLOBIN: 9.6 g/dL — AB (ref 12.0–16.0)
Lymphocytes Relative: 4 %
Lymphs Abs: 0.4 10*3/uL — ABNORMAL LOW (ref 1.0–3.6)
MCH: 30.4 pg (ref 26.0–34.0)
MCHC: 33.8 g/dL (ref 32.0–36.0)
MCV: 89.7 fL (ref 80.0–100.0)
MONO ABS: 1.2 10*3/uL — AB (ref 0.2–0.9)
MONOS PCT: 13 %
NEUTROS PCT: 80 %
Neutro Abs: 7.3 10*3/uL — ABNORMAL HIGH (ref 1.4–6.5)
Platelets: 216 10*3/uL (ref 150–440)
RBC: 3.16 MIL/uL — ABNORMAL LOW (ref 3.80–5.20)
RDW: 16.9 % — ABNORMAL HIGH (ref 11.5–14.5)
WBC: 9.1 10*3/uL (ref 3.6–11.0)

## 2016-12-24 ENCOUNTER — Inpatient Hospital Stay: Payer: Medicare Other

## 2016-12-24 ENCOUNTER — Inpatient Hospital Stay (HOSPITAL_BASED_OUTPATIENT_CLINIC_OR_DEPARTMENT_OTHER): Payer: Medicare Other | Admitting: Internal Medicine

## 2016-12-24 VITALS — BP 137/82 | HR 108 | Temp 97.8°F | Resp 18 | Ht 67.0 in | Wt 125.6 lb

## 2016-12-24 DIAGNOSIS — E119 Type 2 diabetes mellitus without complications: Secondary | ICD-10-CM

## 2016-12-24 DIAGNOSIS — C8333 Diffuse large B-cell lymphoma, intra-abdominal lymph nodes: Secondary | ICD-10-CM | POA: Diagnosis not present

## 2016-12-24 DIAGNOSIS — Z7984 Long term (current) use of oral hypoglycemic drugs: Secondary | ICD-10-CM

## 2016-12-24 DIAGNOSIS — I700 Atherosclerosis of aorta: Secondary | ICD-10-CM

## 2016-12-24 DIAGNOSIS — R509 Fever, unspecified: Secondary | ICD-10-CM | POA: Diagnosis not present

## 2016-12-24 DIAGNOSIS — J8410 Pulmonary fibrosis, unspecified: Secondary | ICD-10-CM | POA: Diagnosis not present

## 2016-12-24 DIAGNOSIS — Z87891 Personal history of nicotine dependence: Secondary | ICD-10-CM | POA: Diagnosis not present

## 2016-12-24 DIAGNOSIS — R634 Abnormal weight loss: Secondary | ICD-10-CM | POA: Diagnosis not present

## 2016-12-24 DIAGNOSIS — E7800 Pure hypercholesterolemia, unspecified: Secondary | ICD-10-CM

## 2016-12-24 DIAGNOSIS — I10 Essential (primary) hypertension: Secondary | ICD-10-CM

## 2016-12-24 DIAGNOSIS — Z8711 Personal history of peptic ulcer disease: Secondary | ICD-10-CM

## 2016-12-24 DIAGNOSIS — Z79899 Other long term (current) drug therapy: Secondary | ICD-10-CM

## 2016-12-24 DIAGNOSIS — R5383 Other fatigue: Secondary | ICD-10-CM

## 2016-12-24 LAB — CBC WITH DIFFERENTIAL/PLATELET
BASOS ABS: 0.1 10*3/uL (ref 0–0.1)
Basophils Relative: 2 %
Eosinophils Absolute: 0.1 10*3/uL (ref 0–0.7)
Eosinophils Relative: 1 %
HEMATOCRIT: 28.6 % — AB (ref 35.0–47.0)
Hemoglobin: 9.7 g/dL — ABNORMAL LOW (ref 12.0–16.0)
LYMPHS ABS: 0.4 10*3/uL — AB (ref 1.0–3.6)
LYMPHS PCT: 7 %
MCH: 30.2 pg (ref 26.0–34.0)
MCHC: 33.9 g/dL (ref 32.0–36.0)
MCV: 89.1 fL (ref 80.0–100.0)
MONO ABS: 1.1 10*3/uL — AB (ref 0.2–0.9)
Monocytes Relative: 17 %
NEUTROS ABS: 4.7 10*3/uL (ref 1.4–6.5)
Neutrophils Relative %: 73 %
Platelets: 316 10*3/uL (ref 150–440)
RBC: 3.21 MIL/uL — ABNORMAL LOW (ref 3.80–5.20)
RDW: 16.9 % — AB (ref 11.5–14.5)
WBC: 6.4 10*3/uL (ref 3.6–11.0)

## 2016-12-24 LAB — COMPREHENSIVE METABOLIC PANEL
ALT: 8 U/L — ABNORMAL LOW (ref 14–54)
AST: 14 U/L — AB (ref 15–41)
Albumin: 3.3 g/dL — ABNORMAL LOW (ref 3.5–5.0)
Alkaline Phosphatase: 63 U/L (ref 38–126)
Anion gap: 7 (ref 5–15)
BILIRUBIN TOTAL: 0.5 mg/dL (ref 0.3–1.2)
BUN: 7 mg/dL (ref 6–20)
CO2: 29 mmol/L (ref 22–32)
CREATININE: 0.32 mg/dL — AB (ref 0.44–1.00)
Calcium: 8.9 mg/dL (ref 8.9–10.3)
Chloride: 104 mmol/L (ref 101–111)
GFR calc Af Amer: >60 mL/min (ref >60)
Glucose, Bld: 145 mg/dL — ABNORMAL HIGH (ref 65–99)
POTASSIUM: 3 mmol/L — AB (ref 3.5–5.1)
Sodium: 140 mmol/L (ref 135–145)
TOTAL PROTEIN: 6.5 g/dL (ref 6.5–8.1)

## 2016-12-24 LAB — LACTATE DEHYDROGENASE: LDH: 202 U/L — AB (ref 98–192)

## 2016-12-24 MED ORDER — VINCRISTINE SULFATE CHEMO INJECTION 1 MG/ML
Freq: Once | INTRAVENOUS | Status: AC
  Administered 2016-12-24: 13:00:00 via INTRAVENOUS
  Filled 2016-12-24: qty 11

## 2016-12-24 MED ORDER — POTASSIUM CHLORIDE CRYS ER 20 MEQ PO TBCR: EXTENDED_RELEASE_TABLET | ORAL | 3 refills | Status: DC

## 2016-12-24 MED ORDER — DIPHENHYDRAMINE HCL 25 MG PO CAPS
50.0000 mg | ORAL_CAPSULE | Freq: Once | ORAL | Status: AC
  Administered 2016-12-24: 50 mg via ORAL
  Filled 2016-12-24: qty 2

## 2016-12-24 MED ORDER — SODIUM CHLORIDE 0.9% FLUSH
10.0000 mL | Freq: Once | INTRAVENOUS | Status: AC
  Administered 2016-12-24: 10 mL via INTRAVENOUS
  Filled 2016-12-24: qty 10

## 2016-12-24 MED ORDER — SODIUM CHLORIDE 0.9 % IV SOLN
700.0000 mg | Freq: Once | INTRAVENOUS | Status: AC
Start: 2016-12-24 — End: 2016-12-24
  Administered 2016-12-24: 700 mg via INTRAVENOUS
  Filled 2016-12-24: qty 50

## 2016-12-24 MED ORDER — CEPHALEXIN 500 MG PO CAPS: 500.0000 mg | ORAL_CAPSULE | Freq: Three times a day (TID) | ORAL | 0 refills | Status: DC

## 2016-12-24 MED ORDER — ACETAMINOPHEN 325 MG PO TABS
650.0000 mg | ORAL_TABLET | Freq: Once | ORAL | Status: AC
  Administered 2016-12-24: 650 mg via ORAL
  Filled 2016-12-24: qty 2

## 2016-12-24 MED ORDER — SODIUM CHLORIDE 0.9 % IV SOLN: 375.0000 mg/m2 | Freq: Once | INTRAVENOUS | Status: DC

## 2016-12-24 MED ORDER — SODIUM CHLORIDE 0.9 % IV SOLN
INTRAVENOUS | Status: DC
  Administered 2016-12-24: 10:00:00 via INTRAVENOUS
  Filled 2016-12-24: qty 1000

## 2016-12-24 MED ORDER — HEPARIN SOD (PORK) LOCK FLUSH 100 UNIT/ML IV SOLN: 500.0000 [IU] | Freq: Once | INTRAVENOUS | Status: AC

## 2016-12-24 MED ORDER — ONDANSETRON HCL 40 MG/20ML IJ SOLN
Freq: Once | INTRAMUSCULAR | Status: AC
  Administered 2016-12-24: 8 mg via INTRAVENOUS
  Filled 2016-12-24: qty 4

## 2016-12-24 NOTE — Progress Notes (Signed)
Nutrition Follow-up:  Nutrition follow-up completed in infusion suite this am. Husband at chairside.  Patient with B-cell lymphoma.    Patient reports poor appetite primarily due to nausea since last visit.  Does not like to take nausea medication or any medication.  Takes nausea medications as a last resort.  Reports boost breeze did not cause diarrhea but did not like the taste.  Ensure/boost products cause stomach upset and does not like those drinks.   Reports some taste changes.  Food does not taste like it use too.  Also reports some sore mouth  Medications: reviewed  Labs: K 3.0, glucose 145  Anthropometrics:   Weight 125 lb 9.6 oz today decreased from 129 lb on 4/26   NUTRITION DIAGNOSIS: Inadequate food and beverage intake related to cancer and cancer related treatments as evidenced by 16% weight loss in the last 3 months, eating less than or equal to 75% estimated energy needs in the last month.   MALNUTRITION DIAGNOSIS: Severe malnutrition continues   INTERVENTION:   Encouraged patient once again to take nausea medication to help control nausea as this is the primary reason patient reports decreased appetite over the last several weeks.  Discussed decreased nutrition because of nausea causes loss of lean body mass, weakness which can effect treatment.  Patient verbalized understanding. Offered recipes for making smoothies using soy milk (alternatives to milk) as patient feels these items cause stomach upset.  Samples of pronourish given to patient and ensure clear.  Coupons also given. Discussed taste changes and strategies to improve nutrition. Fact sheet given.  Also discussed sore mouth and foods to eat with sore mouth.    MONITORING, EVALUATION, GOAL: Patient will consume adequate calories and protein to meet nutritional needs and prevent further weight loss.   NEXT VISIT: May 14th after labs  Aundraya Dripps B. [name], [place], [place] Registered Dietitian [phone]  (pager)

## 2016-12-24 NOTE — Assessment & Plan Note (Addendum)
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 11/01/2016 PET excellent PR. # S/p  #4  of DA-EPOCH [s/p #1 cycle of R-CHOP] chemotherapy appx 3 weeks ago.  # Proceed with cycle #5 DA- [place] [prior cycle of R-CHOP]. . No signs of clinical progression. discussed the possible ned for consolidation radaition given the largest size of the primary mass  # Hx of neutropenia sec to chemo s/p neulasta- improved/ resolved. .  Discussed neutropenic precautions. New prescription for keflex  Given [intol to Levaquin- palpitaions]; not to use it unless she has fevers; she calls before using it.   # Nosebleed-not spontaneous- resolved.   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 Stable.     # PET scan may 28th week- follow up few days later; no chemo; labs- twice a week.

## 2016-12-24 NOTE — Progress Notes (Signed)
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)     HISTORY OF PRESENTING ILLNESS:  [place] 71 y.o.  female with above history of diffuse large B-cell lymphoma [tri[facility] hit] [facility]. Patient received cycle #5 approximately 3 weeks ago.   After his fifth cycle patient noted to have- low-grade fever; started on Levaquin however had palpitations. Discontinue Levaquin. Chest x-ray showed possible acute bronchitis; history of interstitial lung disease.   Complains of fatigue. Poor taste. Lost 4 pounds.  Otherwise denies any worsening cough or hemoptysis. Denies any unusual chest pain. No unusual shortness of breath or swelling in the legs. Chronic mild tingling and numbness in the extremity is not any worse.  ROS: A complete 10 point review of system is done which is negative except mentioned above in history of present illness  MEDICAL HISTORY:  Past Medical History:  Diagnosis Date  . Diabetes mellitus without complication ([place])   . History of gastric ulcer   . Hypercholesterolemia   . Hypertension   . ILD (interstitial lung disease) ([place])    8 yrs ago  .  Lymphadenopathy     SURGICAL HISTORY: Past Surgical History:  Procedure Laterality Date  . BREAST BIOPSY Left 2010   neg  . HERNIA REPAIR    . PARTIAL HYSTERECTOMY     age 6  . PERIPHERAL VASCULAR CATHETERIZATION N/A 08/22/2016   Procedure: [name];  Surgeon: [name], MD;  Location: [facility];  Service: Cardiovascular;  Laterality: N/A;  . TONSILLECTOMY      SOCIAL HISTORY: quit smoking 40 years ago; human resources retd; in [place]; no alcohol Social History   Social History  . Marital status: Married    Spouse name: Saleemah Mollenhauer  . Number of children: 2  . Years of education: N/A   Occupational History  . Retired    Social History Main Topics  . Smoking status: Former Smoker    Quit date: 08/21/1958  . Smokeless tobacco: Never Used  . Alcohol use No  . Drug use: No  . Sexual activity: No   Other Topics Concern  . Not on file   Social History Narrative   Not applicable at this time    FAMILY HISTORY: Family History  Problem Relation Age of Onset  . Heart disease Mother   . Heart disease Father   . Heart disease Brother     ALLERGIES:  is allergic to levaquin [levofloxacin in d5w]; cefuroxime axetil; and doxycycline.  MEDICATIONS:  Current Outpatient Prescriptions  Medication Sig Dispense Refill  . b complex vitamins tablet Take 1 tablet by mouth  daily.    . gabapentin (NEURONTIN) 100 MG capsule TAKE ONE CAPSULE BY MOUTH THREE TIMES DAILY 90 capsule 3  . latanoprost (XALATAN) 0.005 % ophthalmic solution Place 1 drop into both eyes at bedtime.     . lidocaine-prilocaine (EMLA) cream Apply cream 1 hour before chemotherapy treatment, place small amount of saran wrap over cream to protect clothing. 30 g 1  . magic mouthwash w/lidocaine SOLN Take 5 mLs by mouth 4 (four) times daily. 80 ml viscous lidocaine 2%, 80 ml Mylanta, 80 ml Diphenhydramine 12.5 mg/5 ml Elixir, 80 ml Nystatin 100,000 Unit suspension, 80 ml Prednisolone 15  mg/59ml, 80 ml Distilled Water. Sig: Swish/Swallow 5-10 ml four times a day as needed. Dispense 480 ml. 3RFs 480 mL 3  . Magnesium 400 MG TABS Take 1 tablet by mouth daily.    . metFORMIN (GLUCOPHAGE) 500 MG tablet Take 1 tablet by mouth daily with breakfast.     . ondansetron (ZOFRAN) 8 MG tablet TAKE 1 TABLET BY MOUTH EVERY 8 HOURS AS NEEDED FOR NAUSEA AND VOMITING (TAKE TWICE DAILY FOR THE FIRST 3 DAYS AFTER CHEMOTHERAPY) 30 tablet 0  . oxyCODONE (ROXICODONE) 5 MG immediate release tablet Take 0.5 tablets (2.5 mg total) by mouth every 6 (six) hours as needed for breakthrough pain. 60 tablet 0  . pantoprazole (PROTONIX) 40 MG tablet Take 1 tablet by mouth 2 (two) times daily.    . pravastatin (PRAVACHOL) 40 MG tablet Take 1 tablet by mouth daily.    . prochlorperazine (COMPAZINE) 10 MG tablet Take 1 tablet (10 mg total) by mouth every 6 (six) hours as needed for nausea, vomiting or refractory nausea / vomiting. 30 tablet 0  . cephALEXin (KEFLEX) 500 MG capsule Take 1 capsule (500 mg total) by mouth 3 (three) times daily. 30 capsule 0  . losartan-hydrochlorothiazide (HYZAAR) 100-12.5 MG tablet Take 1 tablet by mouth daily.    . potassium chloride SA (K-DUR,KLOR-CON) 20 MEQ tablet 1 pill twice a day 40 tablet 3  . predniSONE (DELTASONE) 50 MG tablet 2 tablets in morning; and 2 tablets in evening; take with food. Start with chemotherapy. (Patient not taking: Reported on 12/13/2016) 20 tablet 3   No current facility-administered medications for this visit.    Facility-Administered Medications Ordered in Other Visits  Medication Dose Route Frequency Provider Last Rate Last Dose  . 0.9 %  sodium chloride infusion   Intravenous Continuous Cammie Sickle, MD   Stopped at 11/05/16 1217  . 0.9 %  sodium chloride infusion   Intravenous Continuous Cammie Sickle, MD   Stopped at 12/03/16 1345  . 0.9 %  sodium chloride infusion   Intravenous Continuous Cammie Sickle, MD 10 mL/hr at  12/05/16 1030    . 0.9 %  sodium chloride infusion   Intravenous Continuous Cammie Sickle, MD   Stopped at 12/24/16 1256  . DOXOrubicin (ADRIAMYCIN) 22 mg, etoposide (VEPESID) 106 mg, vinCRIStine (ONCOVIN) 0.8 mg in sodium chloride 0.9 % 500 mL chemo infusion   Intravenous Once Cammie Sickle, MD 24 mL/hr at 12/24/16 1300    . heparin lock flush 100 unit/mL  500 Units Intravenous Once Charlaine Dalton R, MD      . heparin lock flush 100 unit/mL  500 Units Intracatheter Once PRN Charlaine Dalton R, MD      . heparin lock flush 100 unit/mL  500 Units Intravenous Once Charlaine Dalton R, MD      . sodium chloride flush (NS) 0.9 % injection  10 mL  10 mL Intravenous PRN Cammie Sickle, MD   10 mL at 11/06/16 1000  . sodium chloride flush (NS) 0.9 % injection 10 mL  10 mL Intravenous PRN Cammie Sickle, MD   10 mL at 11/07/16 0905  . sodium chloride flush (NS) 0.9 % injection 10 mL  10 mL Intravenous PRN Charlaine Dalton R, MD      . sodium chloride flush (NS) 0.9 % injection 10 mL  10 mL Intracatheter PRN Charlaine Dalton R, MD      . sodium chloride flush (NS) 0.9 % injection 10 mL  10 mL Intravenous PRN Cammie Sickle, MD   10 mL at 12/05/16 1030      .  PHYSICAL EXAMINATION: ECOG PERFORMANCE STATUS: 1 - Symptomatic but completely ambulatory  Vitals:   12/24/16 0840 12/24/16 0910  BP: 137/82   Pulse: (!) 123 (!) 108  Resp: 18   Temp: 97.8 F (36.6 C)    Filed Weights   12/24/16 0840  Weight: 125 lb 9.6 oz (57 kg)    GENERAL: Well-nourished well-developed; Alert, no distress and comfortable.   With her husband. EYES: no pallor or icterus;  OROPHARYNX: no thrush or ulceration; good dentition  NECK: supple, no masses felt LYMPH:  no palpable lymphadenopathy in the cervical, axillary or inguinal regions LUNGS: Positive for crackles at the bases auscultation and  No wheeze.  HEART/CVS: regular rate & rhythm and no murmurs; No lower  extremity edema ABDOMEN: abdomen soft, no tenderness; normal bowel sounds.  PSYCH: alert & oriented x 3 with fluent speech NEURO: no focal motor/sensory deficits SKIN:  no rashes or significant lesions  LABORATORY DATA:  I have reviewed the data as listed Lab Results  Component Value Date   WBC 6.4 12/24/2016   HGB 9.7 (L) 12/24/2016   HCT 28.6 (L) 12/24/2016   MCV 89.1 12/24/2016   PLT 316 12/24/2016    Recent Labs  11/26/16 0752 12/03/16 0903 12/24/16 0820  NA 137 138 140  K 3.4* 3.3* 3.0*  CL 103 102 104  CO2 25 27 29   GLUCOSE 238* 162* 145*  BUN 6 8 7   CREATININE 0.43* 0.34* 0.32*  CALCIUM 8.7* 9.2 8.9  GFRNONAA >60 >60 >60  GFRAA >60 >60 >60  PROT 6.5 6.6 6.5  ALBUMIN 3.4* 3.6 3.3*  AST 19 16 14*  ALT 9* 9* 8*  ALKPHOS 67 57 63  BILITOT 0.5 0.3 0.5    RADIOGRAPHIC STUDIES: I have personally reviewed the radiological images as listed and agreed with the findings in the report. Dg Chest 2 View  Result Date: 12/18/2016 CLINICAL DATA:  Fever, weakness, and shortness of breath. History of lymphoma and pulmonary fibrosis. Patient underwent most recent chemotherapy session 9 days ago. EXAM: CHEST  2 VIEW COMPARISON:  PA and lateral chest x-ray of November 22, 2016 FINDINGS: The lungs are adequately inflated. The interstitial markings remain coarse bilaterally and are slightly more conspicuous overall today. There is no alveolar infiltrate or pleural effusion. The heart and pulmonary vascularity are normal. The mediastinum is normal in width. There is calcification in the wall of the aortic arch. The bony thorax exhibits no acute abnormality. IMPRESSION: Mildly increased prominence of the interstitial markings may reflect acute bronchitic change or early interstitial pneumonia superimposed upon known significant fibrotic processes. There is no alveolar pneumonia nor CHF. Followup PA and lateral chest X-ray is recommended in 3-4 weeks following trial of antibiotic therapy to ensure  resolution and  exclude underlying malignancy. Thoracic aortic atherosclerosis. Electronically Signed   By: David  Martinique M.D.   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 11/01/2016 PET excellent PR. # S/p  #4  of DA-EPOCH [s/p #1 cycle of R-CHOP] chemotherapy appx 3 weeks ago.  # Proceed with cycle #5 DA- [place] [prior cycle of R-CHOP]. . No signs of clinical progression. discussed the possible ned for consolidation radaition given the largest size of the primary mass  # Hx of neutropenia sec to chemo s/p neulasta- improved/ resolved. .  Discussed neutropenic precautions. New prescription for keflex  Given [intol to Levaquin- palpitaions]; not to use it unless she has fevers; she calls before using it.   # Nosebleed-not spontaneous- resolved.   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 Stable.     # PET scan may 28th week- follow up few days later; no chemo; labs- twice a week.   All questions were answered. The patient knows to call the clinic with any problems, questions or concerns.    Cammie Sickle, MD 12/24/2016 4:43 PM

## 2016-12-25 ENCOUNTER — Inpatient Hospital Stay: Payer: Medicare Other

## 2016-12-25 VITALS — BP 112/67 | HR 80 | Temp 97.1°F | Resp 18

## 2016-12-25 DIAGNOSIS — C8333 Diffuse large B-cell lymphoma, intra-abdominal lymph nodes: Secondary | ICD-10-CM | POA: Diagnosis not present

## 2016-12-25 MED ORDER — VINCRISTINE SULFATE CHEMO INJECTION 1 MG/ML
Freq: Once | INTRAVENOUS | Status: AC
  Administered 2016-12-25: 11:00:00 via INTRAVENOUS
  Filled 2016-12-25: qty 11

## 2016-12-25 MED ORDER — ONDANSETRON HCL 40 MG/20ML IJ SOLN
Freq: Once | INTRAMUSCULAR | Status: AC
  Administered 2016-12-25: 8 mg via INTRAVENOUS
  Filled 2016-12-25: qty 4

## 2016-12-26 ENCOUNTER — Inpatient Hospital Stay: Payer: Medicare Other

## 2016-12-26 VITALS — BP 123/71 | HR 68 | Temp 97.4°F | Resp 16

## 2016-12-26 DIAGNOSIS — C8333 Diffuse large B-cell lymphoma, intra-abdominal lymph nodes: Secondary | ICD-10-CM

## 2016-12-26 LAB — CBC WITH DIFFERENTIAL/PLATELET
BASOS ABS: 0 10*3/uL (ref 0–0.1)
Basophils Relative: 0 %
Eosinophils Absolute: 0 10*3/uL (ref 0–0.7)
Eosinophils Relative: 0 %
HEMATOCRIT: 27.6 % — AB (ref 35.0–47.0)
HEMOGLOBIN: 9.5 g/dL — AB (ref 12.0–16.0)
LYMPHS ABS: 0.5 10*3/uL — AB (ref 1.0–3.6)
LYMPHS PCT: 5 %
MCH: 30.9 pg (ref 26.0–34.0)
MCHC: 34.3 g/dL (ref 32.0–36.0)
MCV: 90 fL (ref 80.0–100.0)
Monocytes Absolute: 0.9 10*3/uL (ref 0.2–0.9)
Monocytes Relative: 11 %
NEUTROS ABS: 7.2 10*3/uL — AB (ref 1.4–6.5)
NEUTROS PCT: 84 %
PLATELETS: 358 10*3/uL (ref 150–440)
RBC: 3.07 MIL/uL — AB (ref 3.80–5.20)
RDW: 16.8 % — ABNORMAL HIGH (ref 11.5–14.5)
WBC: 8.6 10*3/uL (ref 3.6–11.0)

## 2016-12-26 MED ORDER — SODIUM CHLORIDE 0.9 % IV SOLN
Freq: Once | INTRAVENOUS | Status: AC
  Administered 2016-12-26: 8 mg via INTRAVENOUS
  Filled 2016-12-26: qty 4

## 2016-12-26 MED ORDER — SODIUM CHLORIDE 0.9 % IV SOLN
INTRAVENOUS | Status: AC
  Administered 2016-12-26: 09:00:00 via INTRAVENOUS
  Filled 2016-12-26: qty 1000

## 2016-12-26 MED ORDER — VINCRISTINE SULFATE CHEMO INJECTION 1 MG/ML
Freq: Once | INTRAVENOUS | Status: AC
  Administered 2016-12-26: 10:00:00 via INTRAVENOUS
  Filled 2016-12-26: qty 11

## 2016-12-27 ENCOUNTER — Inpatient Hospital Stay: Payer: Medicare Other

## 2016-12-27 VITALS — BP 149/78 | HR 75 | Temp 96.3°F | Resp 18

## 2016-12-27 DIAGNOSIS — C8333 Diffuse large B-cell lymphoma, intra-abdominal lymph nodes: Secondary | ICD-10-CM

## 2016-12-27 MED ORDER — SODIUM CHLORIDE 0.9 % IV SOLN
INTRAVENOUS | Status: AC
  Administered 2016-12-27: 10:00:00 via INTRAVENOUS
  Filled 2016-12-27 (×2): qty 1000

## 2016-12-27 MED ORDER — SODIUM CHLORIDE 0.9 % IV SOLN
Freq: Once | INTRAVENOUS | Status: AC
  Administered 2016-12-27: 8 mg via INTRAVENOUS
  Filled 2016-12-27: qty 4

## 2016-12-27 MED ORDER — SODIUM CHLORIDE 0.9% FLUSH
10.0000 mL | INTRAVENOUS | Status: AC | PRN
  Administered 2016-12-27: 10 mL via INTRAVENOUS
  Filled 2016-12-27: qty 10

## 2016-12-27 MED ORDER — VINCRISTINE SULFATE CHEMO INJECTION 1 MG/ML
Freq: Once | INTRAVENOUS | Status: AC
  Administered 2016-12-27: 11:00:00 via INTRAVENOUS
  Filled 2016-12-27: qty 11

## 2016-12-28 ENCOUNTER — Inpatient Hospital Stay: Payer: Medicare Other

## 2016-12-28 ENCOUNTER — Other Ambulatory Visit: Payer: Self-pay | Admitting: Internal Medicine

## 2016-12-28 VITALS — BP 130/76 | HR 90 | Temp 96.9°F | Resp 18

## 2016-12-28 DIAGNOSIS — E860 Dehydration: Secondary | ICD-10-CM

## 2016-12-28 DIAGNOSIS — C8333 Diffuse large B-cell lymphoma, intra-abdominal lymph nodes: Secondary | ICD-10-CM

## 2016-12-28 LAB — CBC WITH DIFFERENTIAL/PLATELET
BASOS PCT: 0 %
Basophils Absolute: 0 10*3/uL (ref 0–0.1)
EOS ABS: 0 10*3/uL (ref 0–0.7)
Eosinophils Relative: 0 %
HCT: 28.2 % — ABNORMAL LOW (ref 35.0–47.0)
HEMOGLOBIN: 9.7 g/dL — AB (ref 12.0–16.0)
LYMPHS ABS: 0.4 10*3/uL — AB (ref 1.0–3.6)
Lymphocytes Relative: 15 %
MCH: 30.2 pg (ref 26.0–34.0)
MCHC: 34.4 g/dL (ref 32.0–36.0)
MCV: 87.7 fL (ref 80.0–100.0)
Monocytes Absolute: 0.1 10*3/uL — ABNORMAL LOW (ref 0.2–0.9)
Monocytes Relative: 6 %
NEUTROS PCT: 79 %
Neutro Abs: 2 10*3/uL (ref 1.4–6.5)
Platelets: 310 10*3/uL (ref 150–440)
RBC: 3.22 MIL/uL — AB (ref 3.80–5.20)
RDW: 16.7 % — ABNORMAL HIGH (ref 11.5–14.5)
WBC: 2.5 10*3/uL — ABNORMAL LOW (ref 3.6–11.0)

## 2016-12-28 MED ORDER — HEPARIN SOD (PORK) LOCK FLUSH 100 UNIT/ML IV SOLN
500.0000 [IU] | Freq: Once | INTRAVENOUS | Status: AC | PRN
  Administered 2016-12-28: 500 [IU]

## 2016-12-28 MED ORDER — SODIUM CHLORIDE 0.9 % IV SOLN
Freq: Once | INTRAVENOUS | Status: AC
  Administered 2016-12-28: 10:00:00 via INTRAVENOUS
  Filled 2016-12-28: qty 1000

## 2016-12-28 MED ORDER — HEPARIN SOD (PORK) LOCK FLUSH 100 UNIT/ML IV SOLN: 500.0000 [IU] | Freq: Once | INTRAVENOUS | Status: DC

## 2016-12-28 MED ORDER — HEPARIN SOD (PORK) LOCK FLUSH 100 UNIT/ML IV SOLN
INTRAVENOUS | Status: AC
  Filled 2016-12-28: qty 5

## 2016-12-28 MED ORDER — SODIUM CHLORIDE 0.9 % IV SOLN
Freq: Once | INTRAVENOUS | Status: AC
  Administered 2016-12-28: 16 mg via INTRAVENOUS
  Filled 2016-12-28: qty 8

## 2016-12-28 MED ORDER — PEGFILGRASTIM 6 MG/0.6ML ~~LOC~~ PSKT
6.0000 mg | PREFILLED_SYRINGE | Freq: Once | SUBCUTANEOUS | Status: AC
  Administered 2016-12-28: 6 mg via SUBCUTANEOUS
  Filled 2016-12-28: qty 0.6

## 2016-12-28 MED ORDER — SODIUM CHLORIDE 0.9 % IV SOLN
1400.0000 mg | Freq: Once | INTRAVENOUS | Status: AC
  Administered 2016-12-28: 1400 mg via INTRAVENOUS
  Filled 2016-12-28: qty 50

## 2016-12-28 MED ORDER — SODIUM CHLORIDE 0.9% FLUSH
10.0000 mL | Freq: Once | INTRAVENOUS | Status: AC
  Administered 2016-12-28: 10 mL via INTRAVENOUS
  Filled 2016-12-28: qty 10

## 2016-12-31 ENCOUNTER — Inpatient Hospital Stay: Payer: Medicare Other

## 2016-12-31 DIAGNOSIS — C8333 Diffuse large B-cell lymphoma, intra-abdominal lymph nodes: Secondary | ICD-10-CM | POA: Diagnosis not present

## 2016-12-31 LAB — CBC WITH DIFFERENTIAL/PLATELET
Basophils Absolute: 0 10*3/uL (ref 0–0.1)
Basophils Relative: 0 %
EOS PCT: 0 %
Eosinophils Absolute: 0 10*3/uL (ref 0–0.7)
HCT: 29.9 % — ABNORMAL LOW (ref 35.0–47.0)
HEMOGLOBIN: 10 g/dL — AB (ref 12.0–16.0)
LYMPHS ABS: 0.3 10*3/uL — AB (ref 1.0–3.6)
Lymphocytes Relative: 1 %
MCH: 29.5 pg (ref 26.0–34.0)
MCHC: 33.6 g/dL (ref 32.0–36.0)
MCV: 87.6 fL (ref 80.0–100.0)
Monocytes Absolute: 0.1 10*3/uL — ABNORMAL LOW (ref 0.2–0.9)
Monocytes Relative: 0 %
NEUTROS PCT: 99 %
Neutro Abs: 22.7 10*3/uL — ABNORMAL HIGH (ref 1.4–6.5)
PLATELETS: 180 10*3/uL (ref 150–440)
RBC: 3.41 MIL/uL — AB (ref 3.80–5.20)
RDW: 16.6 % — ABNORMAL HIGH (ref 11.5–14.5)
WBC: 23.2 10*3/uL — AB (ref 3.6–11.0)

## 2016-12-31 NOTE — Progress Notes (Addendum)
Nutrition Follow-up:  Nutrition follow-up completed prior to labs this pm.  Husband with patient.  Patient not feeling well today.  "I think my counts are dropping."    Patient reports that her appetite is about the same. Did not elaborate as not feeling well.  Did try the pronourish and supplement did not hurt stomach as ensure/boost products have done in the past.    Medications: reviewed  Labs: reviewed  Anthropometrics:   No new weight taken today   NUTRITION DIAGNOSIS: Inadequate food and beverage intake continues   MALNUTRITION DIAGNOSIS: Severe malnutrition continues   INTERVENTION:   Provided patient with dairy free shake recipes to try as feels milk products hurt her stomach.  Encouraged small frequent meals     MONITORING, EVALUATION, GOAL: Patient will consume adequate calories and protein to meet nutritional needs and prevent further weight loss.     NEXT VISIT: phone f/u June 4th  Kareema Keitt B. [name], [place], [place] Registered Dietitian [phone] (pager)

## 2017-01-01 ENCOUNTER — Other Ambulatory Visit: Payer: Self-pay | Admitting: Internal Medicine

## 2017-01-01 DIAGNOSIS — C8333 Diffuse large B-cell lymphoma, intra-abdominal lymph nodes: Secondary | ICD-10-CM

## 2017-01-01 DIAGNOSIS — R110 Nausea: Secondary | ICD-10-CM

## 2017-01-01 DIAGNOSIS — T451X5A Adverse effect of antineoplastic and immunosuppressive drugs, initial encounter: Principal | ICD-10-CM

## 2017-01-02 ENCOUNTER — Inpatient Hospital Stay: Payer: Medicare Other

## 2017-01-02 DIAGNOSIS — C8333 Diffuse large B-cell lymphoma, intra-abdominal lymph nodes: Secondary | ICD-10-CM | POA: Diagnosis not present

## 2017-01-02 LAB — CBC WITH DIFFERENTIAL/PLATELET
Basophils Absolute: 0 10*3/uL (ref 0–0.1)
Basophils Relative: 1 %
Eosinophils Absolute: 0 10*3/uL (ref 0–0.7)
Eosinophils Relative: 6 %
HEMATOCRIT: 29.2 % — AB (ref 35.0–47.0)
HEMOGLOBIN: 9.8 g/dL — AB (ref 12.0–16.0)
LYMPHS ABS: 0.2 10*3/uL — AB (ref 1.0–3.6)
Lymphocytes Relative: 20 %
MCH: 29.5 pg (ref 26.0–34.0)
MCHC: 33.6 g/dL (ref 32.0–36.0)
MCV: 87.7 fL (ref 80.0–100.0)
MONOS PCT: 5 %
Monocytes Absolute: 0 10*3/uL — ABNORMAL LOW (ref 0.2–0.9)
NEUTROS ABS: 0.6 10*3/uL — AB (ref 1.4–6.5)
NEUTROS PCT: 68 %
Platelets: 85 10*3/uL — ABNORMAL LOW (ref 150–440)
RBC: 3.33 MIL/uL — AB (ref 3.80–5.20)
RDW: 17.2 % — ABNORMAL HIGH (ref 11.5–14.5)
WBC: 0.8 10*3/uL — AB (ref 3.6–11.0)

## 2017-01-07 ENCOUNTER — Inpatient Hospital Stay: Payer: Medicare Other

## 2017-01-07 DIAGNOSIS — C8333 Diffuse large B-cell lymphoma, intra-abdominal lymph nodes: Secondary | ICD-10-CM

## 2017-01-07 LAB — CBC WITH DIFFERENTIAL/PLATELET
BASOS ABS: 0.1 10*3/uL (ref 0–0.1)
Basophils Relative: 1 %
EOS ABS: 0.1 10*3/uL (ref 0–0.7)
EOS PCT: 1 %
HCT: 27 % — ABNORMAL LOW (ref 35.0–47.0)
Hemoglobin: 9.2 g/dL — ABNORMAL LOW (ref 12.0–16.0)
Lymphocytes Relative: 4 %
Lymphs Abs: 0.4 10*3/uL — ABNORMAL LOW (ref 1.0–3.6)
MCH: 29.6 pg (ref 26.0–34.0)
MCHC: 33.9 g/dL (ref 32.0–36.0)
MCV: 87.4 fL (ref 80.0–100.0)
MONO ABS: 1.2 10*3/uL — AB (ref 0.2–0.9)
Monocytes Relative: 12 %
Neutro Abs: 8.4 10*3/uL — ABNORMAL HIGH (ref 1.4–6.5)
Neutrophils Relative %: 82 %
PLATELETS: 141 10*3/uL — AB (ref 150–440)
RBC: 3.09 MIL/uL — AB (ref 3.80–5.20)
RDW: 17.7 % — AB (ref 11.5–14.5)
WBC: 10.2 10*3/uL (ref 3.6–11.0)

## 2017-01-09 ENCOUNTER — Inpatient Hospital Stay: Payer: Medicare Other

## 2017-01-09 DIAGNOSIS — C8333 Diffuse large B-cell lymphoma, intra-abdominal lymph nodes: Secondary | ICD-10-CM | POA: Diagnosis not present

## 2017-01-09 LAB — CBC WITH DIFFERENTIAL/PLATELET
BASOS PCT: 1 %
Basophils Absolute: 0.1 10*3/uL (ref 0–0.1)
EOS ABS: 0.1 10*3/uL (ref 0–0.7)
Eosinophils Relative: 1 %
HEMATOCRIT: 29.1 % — AB (ref 35.0–47.0)
HEMOGLOBIN: 9.5 g/dL — AB (ref 12.0–16.0)
Lymphocytes Relative: 4 %
Lymphs Abs: 0.5 10*3/uL — ABNORMAL LOW (ref 1.0–3.6)
MCH: 29 pg (ref 26.0–34.0)
MCHC: 32.7 g/dL (ref 32.0–36.0)
MCV: 88.6 fL (ref 80.0–100.0)
Monocytes Absolute: 1.4 10*3/uL — ABNORMAL HIGH (ref 0.2–0.9)
Monocytes Relative: 12 %
NEUTROS ABS: 9.6 10*3/uL — AB (ref 1.4–6.5)
NEUTROS PCT: 82 %
Platelets: 198 10*3/uL (ref 150–440)
RBC: 3.28 MIL/uL — ABNORMAL LOW (ref 3.80–5.20)
RDW: 17.5 % — ABNORMAL HIGH (ref 11.5–14.5)
WBC: 11.7 10*3/uL — AB (ref 3.6–11.0)

## 2017-01-14 ENCOUNTER — Ambulatory Visit: Payer: Medicare Other

## 2017-01-15 ENCOUNTER — Encounter
Admission: RE | Admit: 2017-01-15 | Discharge: 2017-01-15 | Disposition: A | Discharge status: Home / Self Care | Payer: Medicare Other | Source: Ambulatory Visit | Attending: Internal Medicine | Admitting: Internal Medicine

## 2017-01-15 DIAGNOSIS — C8333 Diffuse large B-cell lymphoma, intra-abdominal lymph nodes: Secondary | ICD-10-CM | POA: Insufficient documentation

## 2017-01-15 LAB — GLUCOSE, CAPILLARY: Glucose-Capillary: 133 mg/dL — ABNORMAL HIGH (ref 65–99)

## 2017-01-15 MED ORDER — FLUDEOXYGLUCOSE F - 18 (FDG) INJECTION
12.0000 | Freq: Once | INTRAVENOUS | Status: AC | PRN
  Administered 2017-01-15: 12.49 via INTRAVENOUS

## 2017-01-17 ENCOUNTER — Inpatient Hospital Stay (HOSPITAL_BASED_OUTPATIENT_CLINIC_OR_DEPARTMENT_OTHER): Payer: Medicare Other | Admitting: Internal Medicine

## 2017-01-17 VITALS — BP 158/84 | HR 123 | Temp 98.6°F | Resp 16 | Ht 67.0 in | Wt 120.4 lb

## 2017-01-17 DIAGNOSIS — R262 Difficulty in walking, not elsewhere classified: Secondary | ICD-10-CM

## 2017-01-17 DIAGNOSIS — T451X5A Adverse effect of antineoplastic and immunosuppressive drugs, initial encounter: Secondary | ICD-10-CM

## 2017-01-17 DIAGNOSIS — R000 Tachycardia, unspecified: Secondary | ICD-10-CM | POA: Diagnosis not present

## 2017-01-17 DIAGNOSIS — R200 Anesthesia of skin: Secondary | ICD-10-CM

## 2017-01-17 DIAGNOSIS — M21372 Foot drop, left foot: Secondary | ICD-10-CM

## 2017-01-17 DIAGNOSIS — Z79899 Other long term (current) drug therapy: Secondary | ICD-10-CM

## 2017-01-17 DIAGNOSIS — R531 Weakness: Secondary | ICD-10-CM

## 2017-01-17 DIAGNOSIS — C8333 Diffuse large B-cell lymphoma, intra-abdominal lymph nodes: Secondary | ICD-10-CM | POA: Diagnosis not present

## 2017-01-17 DIAGNOSIS — E7800 Pure hypercholesterolemia, unspecified: Secondary | ICD-10-CM

## 2017-01-17 DIAGNOSIS — R002 Palpitations: Secondary | ICD-10-CM | POA: Diagnosis not present

## 2017-01-17 DIAGNOSIS — I10 Essential (primary) hypertension: Secondary | ICD-10-CM

## 2017-01-17 DIAGNOSIS — R05 Cough: Secondary | ICD-10-CM

## 2017-01-17 DIAGNOSIS — I2510 Atherosclerotic heart disease of native coronary artery without angina pectoris: Secondary | ICD-10-CM

## 2017-01-17 DIAGNOSIS — Z7984 Long term (current) use of oral hypoglycemic drugs: Secondary | ICD-10-CM

## 2017-01-17 DIAGNOSIS — I700 Atherosclerosis of aorta: Secondary | ICD-10-CM

## 2017-01-17 DIAGNOSIS — N200 Calculus of kidney: Secondary | ICD-10-CM

## 2017-01-17 DIAGNOSIS — R634 Abnormal weight loss: Secondary | ICD-10-CM

## 2017-01-17 DIAGNOSIS — E876 Hypokalemia: Secondary | ICD-10-CM | POA: Diagnosis not present

## 2017-01-17 DIAGNOSIS — G620 Drug-induced polyneuropathy: Secondary | ICD-10-CM

## 2017-01-17 DIAGNOSIS — M21371 Foot drop, right foot: Secondary | ICD-10-CM | POA: Diagnosis not present

## 2017-01-17 DIAGNOSIS — J8410 Pulmonary fibrosis, unspecified: Secondary | ICD-10-CM

## 2017-01-17 DIAGNOSIS — R5383 Other fatigue: Secondary | ICD-10-CM

## 2017-01-17 DIAGNOSIS — Z87891 Personal history of nicotine dependence: Secondary | ICD-10-CM

## 2017-01-17 DIAGNOSIS — E119 Type 2 diabetes mellitus without complications: Secondary | ICD-10-CM

## 2017-01-17 DIAGNOSIS — Z8711 Personal history of peptic ulcer disease: Secondary | ICD-10-CM

## 2017-01-17 DIAGNOSIS — K449 Diaphragmatic hernia without obstruction or gangrene: Secondary | ICD-10-CM

## 2017-01-17 MED ORDER — GABAPENTIN 100 MG PO CAPS: 200.0000 mg | ORAL_CAPSULE | Freq: Three times a day (TID) | ORAL | 3 refills | Status: DC

## 2017-01-17 MED ORDER — METOPROLOL TARTRATE 25 MG PO TABS: 25.0000 mg | ORAL_TABLET | Freq: Two times a day (BID) | ORAL | 1 refills | Status: DC

## 2017-01-17 MED ORDER — ALBUTEROL SULFATE HFA 108 (90 BASE) MCG/ACT IN AERS: 2.0000 | INHALATION_SPRAY | Freq: Four times a day (QID) | RESPIRATORY_TRACT | 2 refills | Status: DC | PRN

## 2017-01-17 NOTE — Progress Notes (Signed)
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  female with above history of diffuse large B-cell lymphoma [triple hit] [facility] is here For follow-up. Patient received cycle #6 approximately 4 weeks ago; She is here to review the PET scan.  Patient complains of difficulty walking; she feels "legs are weak". No falls. Intermittent numbness and tingling in the legs. Denies any significant headaches or neck stiffness.  Patient also complains of chronic cough; no hemoptysis. Phlegm early in the morning. No fevers or chills. She continues to feel weak.  No unusual shortness of breath or swelling in the legs. She also complains of intermittent palpitations/ heart rate going up to 120s-150s. No chest pain. No lightheadedness.  ROS: A complete 10 point review of system is done which is negative except mentioned above in history of present illness  MEDICAL HISTORY:  Past Medical History:  Diagnosis Date  . Diabetes mellitus without complication ([place])   . History of gastric ulcer   . Hypercholesterolemia   . Hypertension   . ILD  (interstitial lung disease) ([place])    8 yrs ago  . Lymphadenopathy     SURGICAL HISTORY: Past Surgical History:  Procedure Laterality Date  . BREAST BIOPSY Left 2010   neg  . HERNIA REPAIR    . PARTIAL HYSTERECTOMY     age 36  . PERIPHERAL VASCULAR CATHETERIZATION N/A 08/22/2016   Procedure: [name];  Surgeon: [name], MD;  Location: [facility];  Service: Cardiovascular;  Laterality: N/A;  . TONSILLECTOMY      SOCIAL HISTORY: quit smoking 40 years ago; human resources retd; in [place]; no alcohol Social History   Social History  . Marital status: Married    Spouse name: Jissel Slavens  . Number of children: 2  . Years of education: N/A   Occupational History  . Retired    Social History Main Topics  . Smoking status: Former Smoker    Quit date: 08/21/1958  . Smokeless tobacco: Never Used  . Alcohol use No  . Drug use: No  . Sexual activity: No   Other Topics Concern  . Not on file   Social History Narrative   Not applicable at this time    FAMILY HISTORY: Family History  Problem Relation Age of Onset  . Heart disease Mother   . Heart disease Father   . Heart disease Brother     ALLERGIES:  is allergic to levaquin [levofloxacin in d5w]; cefuroxime axetil; and doxycycline.  MEDICATIONS:  Current Outpatient Prescriptions  Medication Sig Dispense Refill  .  b complex vitamins tablet Take 1 tablet by mouth daily.    Marland Kitchen gabapentin (NEURONTIN) 100 MG capsule Take 2 capsules (200 mg total) by mouth 3 (three) times daily. 180 capsule 3  . latanoprost (XALATAN) 0.005 % ophthalmic solution Place 1 drop into both eyes at bedtime.     . lidocaine-prilocaine (EMLA) cream Apply cream 1 hour before chemotherapy treatment, place small amount of saran wrap over cream to protect clothing. 30 g 1  . Magnesium 400 MG TABS Take 1 tablet by mouth daily.    . metFORMIN (GLUCOPHAGE) 500 MG tablet Take 1 tablet by mouth daily with breakfast.     .  ondansetron (ZOFRAN) 8 MG tablet TAKE 1 TABLET BY MOUTH EVERY 8 HOURS AS NEEDED FOR NAUSEA AND VOMITING (TAKE TWICE DAILY FOR THE FIRST 3 DAYS AFTER CHEMOTHERAPY) 30 tablet 0  . pantoprazole (PROTONIX) 40 MG tablet Take 1 tablet by mouth 2 (two) times daily.    . potassium chloride SA (K-DUR,KLOR-CON) 20 MEQ tablet 1 pill twice a day 40 tablet 3  . pravastatin (PRAVACHOL) 40 MG tablet Take 1 tablet by mouth daily.    Marland Kitchen albuterol (PROVENTIL HFA;VENTOLIN HFA) 108 (90 Base) MCG/ACT inhaler Inhale 2 puffs into the lungs every 6 (six) hours as needed for wheezing or shortness of breath. 1 Inhaler 2  . losartan-hydrochlorothiazide (HYZAAR) 100-12.5 MG tablet Take 1 tablet by mouth daily.    . magic mouthwash w/lidocaine SOLN Take 5 mLs by mouth 4 (four) times daily. 80 ml viscous lidocaine 2%, 80 ml Mylanta, 80 ml Diphenhydramine 12.5 mg/5 ml Elixir, 80 ml Nystatin 100,000 Unit suspension, 80 ml Prednisolone 15 mg/71ml, 80 ml Distilled Water. Sig: Swish/Swallow 5-10 ml four times a day as needed. Dispense 480 ml. 3RFs (Patient not taking: Reported on 01/17/2017) 480 mL 3  . metoprolol tartrate (LOPRESSOR) 25 MG tablet Take 1 tablet (25 mg total) by mouth 2 (two) times daily. 60 tablet 1  . oxyCODONE (ROXICODONE) 5 MG immediate release tablet Take 0.5 tablets (2.5 mg total) by mouth every 6 (six) hours as needed for breakthrough pain. (Patient not taking: Reported on 01/17/2017) 60 tablet 0  . predniSONE (DELTASONE) 50 MG tablet 2 tablets in morning; and 2 tablets in evening; take with food. Start with chemotherapy. (Patient not taking: Reported on 12/13/2016) 20 tablet 3  . prochlorperazine (COMPAZINE) 10 MG tablet Take 1 tablet (10 mg total) by mouth every 6 (six) hours as needed for nausea, vomiting or refractory nausea / vomiting. (Patient not taking: Reported on 01/17/2017) 30 tablet 0   No current facility-administered medications for this visit.    Facility-Administered Medications Ordered in Other Visits   Medication Dose Route Frequency Provider Last Rate Last Dose  . 0.9 %  sodium chloride infusion   Intravenous Continuous Cammie Sickle, MD   Stopped at 11/05/16 1217  . 0.9 %  sodium chloride infusion   Intravenous Continuous Cammie Sickle, MD   Stopped at 12/03/16 1345  . 0.9 %  sodium chloride infusion   Intravenous Continuous Cammie Sickle, MD 10 mL/hr at 12/05/16 1030    . 0.9 %  sodium chloride infusion   Intravenous Continuous Cammie Sickle, MD   Stopped at 12/24/16 1256  . 0.9 %  sodium chloride infusion   Intravenous Continuous Cammie Sickle, MD   Stopped at 12/26/16 1011  . 0.9 %  sodium chloride infusion   Intravenous Continuous Cammie Sickle, MD 10 mL/hr at 12/27/16 0930    .  heparin lock flush 100 unit/mL  500 Units Intravenous Once Charlaine Dalton R, MD      . heparin lock flush 100 unit/mL  500 Units Intracatheter Once PRN Charlaine Dalton R, MD      . heparin lock flush 100 unit/mL  500 Units Intravenous Once Charlaine Dalton R, MD      . sodium chloride flush (NS) 0.9 % injection 10 mL  10 mL Intravenous PRN Cammie Sickle, MD   10 mL at 11/06/16 1000  . sodium chloride flush (NS) 0.9 % injection 10 mL  10 mL Intravenous PRN Cammie Sickle, MD   10 mL at 11/07/16 0905  . sodium chloride flush (NS) 0.9 % injection 10 mL  10 mL Intravenous PRN Charlaine Dalton R, MD      . sodium chloride flush (NS) 0.9 % injection 10 mL  10 mL Intracatheter PRN Charlaine Dalton R, MD      . sodium chloride flush (NS) 0.9 % injection 10 mL  10 mL Intravenous PRN Cammie Sickle, MD   10 mL at 12/05/16 1030  . sodium chloride flush (NS) 0.9 % injection 10 mL  10 mL Intravenous PRN Cammie Sickle, MD   10 mL at 12/27/16 0930      .  PHYSICAL EXAMINATION: ECOG PERFORMANCE STATUS: 1 - Symptomatic but completely ambulatory  Vitals:   01/17/17 1351  BP: (!) 158/84  Pulse: (!) 123  Resp: 16  Temp: 98.6 F  (37 C)   Filed Weights   01/17/17 1351  Weight: 120 lb 6.4 oz (54.6 kg)    GENERAL: Well-nourished well-developed; Alert, no distress and comfortable.   With her husband. EYES: no pallor or icterus;  OROPHARYNX: no thrush or ulceration; good dentition  NECK: supple, no masses felt LYMPH:  no palpable lymphadenopathy in the cervical, axillary or inguinal regions LUNGS: Positive for crackles at the bases auscultation and  No wheeze.  HEART/CVS: regular rate & rhythm and no murmurs; No lower extremity edema ABDOMEN: abdomen soft, no tenderness; normal bowel sounds.  PSYCH: alert & oriented x 3 with fluent speech NEURO: no focal motor/sensory deficits; evidence of bilateral mild foot drop. Shuffling gait. SKIN:  no rashes or significant lesions  LABORATORY DATA:  I have reviewed the data as listed Lab Results  Component Value Date   WBC 11.7 (H) 01/09/2017   HGB 9.5 (L) 01/09/2017   HCT 29.1 (L) 01/09/2017   MCV 88.6 01/09/2017   PLT 198 01/09/2017    Recent Labs  11/26/16 0752 12/03/16 0903 12/24/16 0820  NA 137 138 140  K 3.4* 3.3* 3.0*  CL 103 102 104  CO2 25 27 29   GLUCOSE 238* 162* 145*  BUN 6 8 7   CREATININE 0.43* 0.34* 0.32*  CALCIUM 8.7* 9.2 8.9  GFRNONAA >60 >60 >60  GFRAA >60 >60 >60  PROT 6.5 6.6 6.5  ALBUMIN 3.4* 3.6 3.3*  AST 19 16 14*  ALT 9* 9* 8*  ALKPHOS 67 57 63  BILITOT 0.5 0.3 0.5    RADIOGRAPHIC STUDIES: I have personally reviewed the radiological images as listed and agreed with the findings in the report. Nm Pet Image Restag (ps) Skull Base To Thigh  Result Date: 01/16/2017 CLINICAL DATA:  Subsequent treatment strategy for diffuse large B-cell lymphoma. EXAM: NUCLEAR MEDICINE PET SKULL BASE TO THIGH TECHNIQUE: 12.5 mCi F-18 FDG was injected intravenously. Full-ring PET imaging was performed from the skull base to thigh after the radiotracer. CT data was obtained  and used for attenuation correction and anatomic localization. FASTING BLOOD  GLUCOSE:  Value: 133 mg/dl COMPARISON:  Multiple exams, including PET-CT from 11/01/2016 FINDINGS: NECK No hypermetabolic lymph nodes in the neck. CHEST No hypermetabolic mediastinal or hilar nodes. No suspicious pulmonary nodules on the CT data. Accentuated activity along the small hiatal hernia, likely physiologic. Chronic peripheral interstitial lung disease most prominent at the lung bases. Aortic arch and coronary atherosclerosis. Right Port-A-Cath tip: SVC. ABDOMEN/PELVIS Focal activity in the vicinity of the splenic flexure the colon is new and believed to likely be physiologic. There has been further reduction of the retroperitoneal adenopathy and associated activity. The left periaortic node which previously measured 1.8 cm in short axis currently measures 1.2 cm in short axis on image 146/3, with maximum SUV 3.0. (Formerly 3.7). By way of reference, background hepatic activity has SUV 3.0 than background mediastinal blood pool activity has an SUV 2.3. No new adenopathy or significant new abnormal activity is observed in the abdomen or pelvis. Aortoiliac atherosclerotic vascular disease. There is a 2 mm left mid kidney nonobstructive renal calculus. SKELETON No focal hypermetabolic activity to suggest skeletal metastasis. IMPRESSION: 1. Further reduction in size and metabolic activity of retroperitoneal adenopathy. The index left periaortic lymph node measures 1.2 cm in short axis (formerly 1.8 cm) and has a maximum SUV of 3.0 (formerly 3.7). This is similar to background hepatic activity ([place] 3). No new regions of involvement identified. 2. 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 3. Other imaging findings of potential clinical significance: 2 mm nonobstructive left mid kidney calculus. Aortic Atherosclerosis (ICD10-I70.0). Coronary atherosclerosis. 4. Moderate-sized hiatal hernia with some focal accentuated activity which is likely physiologic. Similar likely physiologic  activity in the bowel, including the splenic flexure. Electronically Signed   By: Van Clines M.D.   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 Currently status post S/p  #5  of DA-EPOCH [s/p #1 cycle of R-CHOP] chemotherapy appx 4 weeks ago. Restaging PET scan- significant/excellent response to chemotherapy. ~1.2 cm lymph node in the retroperitoneum/SUV 3 [baseline-approximately 10 cm sized mass; with SUV of around 45]  # Discussed the use of consolidation radiation- especially given her high risk lymphoma. Refer to radiation oncology. Discussed at the tumor conference.   # Difficulty walking/ evidence of bilateral foot drop- secondary to chemotherapy. Recommend physical therapy. Also recommend a referral to neurology. Recommend increasing the dose of Neurontin to 200 mg 3 times a day.  # Cough- recommend continued Claritin; also given a prescription for albuterol inhaler.  # Hypokalemia potassium 3.0 - recommend continued Kdur once a day.   # Tachycardia up to 120s; regular rhythm. Recommend holding Cozaar-hydrochlorothiazide. Start the patient on metoprolol 25 mg twice a day. Also recommend patient follow up with PCP/cardiologist regarding further workup for the tachycardia.  # follow up in 2 months/port flush/ labs.  # I reviewed the blood work- with the patient in detail; also reviewed the imaging independently [as summarized above]; and with the patient in detail.    Cc: Dr.Miller.   All questions were answered. The patient knows to call the clinic with any problems, questions or concerns.    Cammie Sickle, MD 01/17/2017 8:30 PM

## 2017-01-17 NOTE — Assessment & Plan Note (Addendum)
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 Currently status post S/p  #5  of DA-EPOCH [s/p #1 cycle of R-CHOP] chemotherapy appx 4 weeks ago. Restaging PET scan- significant/excellent response to chemotherapy. ~1.2 cm lymph node in the retroperitoneum/SUV 3 [baseline-approximately 10 cm sized mass; with SUV of around 45]  # Discussed the use of consolidation radiation- especially given her high risk lymphoma. Refer to radiation oncology. Discussed at the tumor conference.   # Difficulty walking/ evidence of bilateral foot drop- secondary to chemotherapy. Recommend physical therapy. Also recommend a referral to neurology. Recommend increasing the dose of Neurontin to 200 mg 3 times a day.  # Cough- recommend continued Claritin; also given a prescription for albuterol inhaler.  # Hypokalemia potassium 3.0 - recommend continued Kdur once a day.   # Tachycardia up to 120s; regular rhythm. Recommend holding Cozaar-hydrochlorothiazide. Start the patient on metoprolol 25 mg twice a day. Also recommend patient follow up with PCP/cardiologist regarding further workup for the tachycardia.  # follow up in 2 months/port flush/ labs.  # I reviewed the blood work- with the patient in detail; also reviewed the imaging independently [as summarized above]; and with the patient in detail.    Cc: Dr.Miller.

## 2017-01-21 ENCOUNTER — Telehealth: Payer: Self-pay

## 2017-01-21 NOTE — Telephone Encounter (Signed)
Nutrition Follow-up:  Nutrition follow-up completed via phone this am with patient.  Patient has completed treatment for large b-cell lymphoma.    Patient reports that she is eating a little bit better since chemotherapy is over.  Reports that she eats eggs and bacon for breakfast or biscuit. For lunch usually a meat or vegetable and the same for dinner.  Reports that she has tried 3-4 different no dairy shake recipes that I gave her on last visit and enjoys them.  Drinks about 1 per day between meals.    Reports some trouble swallowing, less nausea. Reports some weakness from neuropathy.   Medications: reviewed  Labs: reviewed  Anthropometrics:   Weight on 5/31 120 lb 6.4 oz increased from 118 lb on 5/29 but decreased from 125 lb 9.6 oz on 5/7.   NUTRITION DIAGNOSIS: Inadequate food and beverage intake continues   MALNUTRITION DIAGNOSIS: severe malnutrition continues   INTERVENTION:   Patient with questions regarding survivorship nutrition.  Discussed plant-based diet and good sources of protein.  Encouraged patient to continue to focus on high calorie, high protein foods at this time until appetite and weight increases.   Encouraged use of non-dairy shakes for added nutrition, likely need at least 2 per day. If concern for aspiration, may need to consider SLP evaluation with difficulty swallowing.      MONITORING, EVALUATION, GOAL: Patient will consume adequate calories and protein to meet nutritional needs and prevent further weight loss.   NEXT VISIT: July 30th after MD visit  [place] B. [name], [place], [place] Registered Dietitian [phone] (pager)

## 2017-01-23 ENCOUNTER — Encounter: Payer: Self-pay | Admitting: Radiation Oncology

## 2017-01-23 ENCOUNTER — Ambulatory Visit
Admission: RE | Admit: 2017-01-23 | Discharge: 2017-01-23 | Disposition: A | Discharge status: Home / Self Care | Payer: Medicare Other | Source: Ambulatory Visit | Attending: Radiation Oncology | Admitting: Radiation Oncology

## 2017-01-23 VITALS — BP 122/68 | HR 92 | Temp 98.3°F | Resp 18 | Ht 67.0 in | Wt 121.4 lb

## 2017-01-23 DIAGNOSIS — Z9221 Personal history of antineoplastic chemotherapy: Secondary | ICD-10-CM | POA: Diagnosis not present

## 2017-01-23 DIAGNOSIS — I10 Essential (primary) hypertension: Secondary | ICD-10-CM | POA: Insufficient documentation

## 2017-01-23 DIAGNOSIS — Z87891 Personal history of nicotine dependence: Secondary | ICD-10-CM | POA: Insufficient documentation

## 2017-01-23 DIAGNOSIS — E119 Type 2 diabetes mellitus without complications: Secondary | ICD-10-CM | POA: Diagnosis not present

## 2017-01-23 DIAGNOSIS — Z7984 Long term (current) use of oral hypoglycemic drugs: Secondary | ICD-10-CM | POA: Insufficient documentation

## 2017-01-23 DIAGNOSIS — J849 Interstitial pulmonary disease, unspecified: Secondary | ICD-10-CM | POA: Insufficient documentation

## 2017-01-23 DIAGNOSIS — C8333 Diffuse large B-cell lymphoma, intra-abdominal lymph nodes: Secondary | ICD-10-CM | POA: Insufficient documentation

## 2017-01-23 DIAGNOSIS — R634 Abnormal weight loss: Secondary | ICD-10-CM | POA: Insufficient documentation

## 2017-01-23 DIAGNOSIS — Z79899 Other long term (current) drug therapy: Secondary | ICD-10-CM | POA: Insufficient documentation

## 2017-01-23 DIAGNOSIS — Z8711 Personal history of peptic ulcer disease: Secondary | ICD-10-CM | POA: Diagnosis not present

## 2017-01-23 DIAGNOSIS — Z510 Encounter for antineoplastic radiation therapy: Secondary | ICD-10-CM | POA: Insufficient documentation

## 2017-01-23 DIAGNOSIS — E7800 Pure hypercholesterolemia, unspecified: Secondary | ICD-10-CM | POA: Insufficient documentation

## 2017-01-23 NOTE — Consult Note (Signed)
NEW PATIENT EVALUATION  Name: Rose Hill  MRN: [id number]  Date:   01/23/2017     DOB: 1946-05-06   This 71 y.o. female patient presents to the clinic for initial evaluation of diffuse B-cell lymphoma of the retroperitoneum status post chemotherapy for involved field radiation.  REFERRING PHYSICIAN: Rusty Aus, MD  CHIEF COMPLAINT:  Chief Complaint  Patient presents with  . 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   PREVIOUS INVESTIGATIONS:  PET CT scans are reviewed Clinical notes reviewed Pathology reviewed Case presented at weekly tumor board  HPI: Patient is a 71 year old female who presented with abdominal complaints weight loss was found to have a large 10 cm mass in the retroperitoneum. 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 She underwent R CHOP chemotherapy followed by.DA-R EPOCH. Initial PET CT scan showed bulky hypermetabolic abdominal retroperitoneal and lymphadenopathy. Repeat PET CT scan after treatment showed small area residual mass only faintly hypermetabolic in the retroperitoneal area. Case was presented weekly tumor conference and recognition for involved field radiation therapy was made. Patient is seen today is quite weak and fatigued from her overall treatment regimen. She specifically denies any abdominal complaints diarrhea dysuria or any other GI/GU complaints.  PLANNED TREATMENT REGIMEN: Involved field radiation therapy  PAST MEDICAL HISTORY:  has a past medical history of Diabetes mellitus without complication ([place]); History of gastric ulcer; Hypercholesterolemia; Hypertension; ILD (interstitial lung disease) ([place]); and Lymphadenopathy.    PAST SURGICAL HISTORY:  Past Surgical History:  Procedure Laterality Date  . BREAST BIOPSY Left 2010   neg  . HERNIA REPAIR    . PARTIAL HYSTERECTOMY     age 35  .  PERIPHERAL VASCULAR CATHETERIZATION N/A 08/22/2016   Procedure: [name];  Surgeon: [name], MD;  Location: [facility];  Service: Cardiovascular;  Laterality: N/A;  . TONSILLECTOMY      FAMILY HISTORY: family history includes Heart disease in her brother, father, and mother.  SOCIAL HISTORY:  reports that she quit smoking about 58 years ago. She has never used smokeless tobacco. She reports that she does not drink alcohol or use drugs.  ALLERGIES: Levaquin [levofloxacin in d5w]; Cefuroxime axetil; and Doxycycline  MEDICATIONS:  Current Outpatient Prescriptions  Medication Sig Dispense Refill  . albuterol (PROVENTIL HFA;VENTOLIN HFA) 108 (90 Base) MCG/ACT inhaler Inhale 2 puffs into the lungs every 6 (six) hours as needed for wheezing or shortness of breath. 1 Inhaler 2  . b complex vitamins tablet Take 1 tablet by mouth daily.    Marland Kitchen gabapentin (NEURONTIN) 100 MG capsule Take 2 capsules (200 mg total) by mouth 3 (three) times daily. 180 capsule 3  . latanoprost (XALATAN) 0.005 % ophthalmic solution Place 1 drop into both eyes at bedtime.     . lidocaine-prilocaine (EMLA) cream Apply cream 1 hour before chemotherapy treatment, place small amount of saran wrap over cream to protect clothing. 30 g 1  . metFORMIN (GLUCOPHAGE) 500 MG tablet Take 1 tablet by mouth daily with breakfast.     . metoprolol tartrate (LOPRESSOR) 25 MG tablet Take 1 tablet (25 mg total) by mouth 2 (two) times daily. 60 tablet 1  . ondansetron (ZOFRAN) 8 MG tablet TAKE 1 TABLET BY MOUTH EVERY 8 HOURS AS NEEDED FOR NAUSEA AND VOMITING (TAKE TWICE DAILY FOR THE FIRST 3 DAYS AFTER CHEMOTHERAPY) 30 tablet  0  . pantoprazole (PROTONIX) 40 MG tablet Take 1 tablet by mouth 2 (two) times daily.    . potassium chloride SA (K-DUR,KLOR-CON) 20 MEQ tablet 1 pill twice a day 40 tablet 3  . losartan-hydrochlorothiazide (HYZAAR) 100-12.5 MG tablet Take 1 tablet by mouth daily.    . magic mouthwash w/lidocaine  SOLN Take 5 mLs by mouth 4 (four) times daily. 80 ml viscous lidocaine 2%, 80 ml Mylanta, 80 ml Diphenhydramine 12.5 mg/5 ml Elixir, 80 ml Nystatin 100,000 Unit suspension, 80 ml Prednisolone 15 mg/72ml, 80 ml Distilled Water. Sig: Swish/Swallow 5-10 ml four times a day as needed. Dispense 480 ml. 3RFs (Patient not taking: Reported on 01/17/2017) 480 mL 3  . Magnesium 400 MG TABS Take 1 tablet by mouth daily.    Marland Kitchen oxyCODONE (ROXICODONE) 5 MG immediate release tablet Take 0.5 tablets (2.5 mg total) by mouth every 6 (six) hours as needed for breakthrough pain. (Patient not taking: Reported on 01/17/2017) 60 tablet 0  . pravastatin (PRAVACHOL) 40 MG tablet Take 1 tablet by mouth daily.    . predniSONE (DELTASONE) 50 MG tablet 2 tablets in morning; and 2 tablets in evening; take with food. Start with chemotherapy. (Patient not taking: Reported on 12/13/2016) 20 tablet 3  . prochlorperazine (COMPAZINE) 10 MG tablet Take 1 tablet (10 mg total) by mouth every 6 (six) hours as needed for nausea, vomiting or refractory nausea / vomiting. (Patient not taking: Reported on 01/17/2017) 30 tablet 0   No current facility-administered medications for this encounter.    Facility-Administered Medications Ordered in Other Encounters  Medication Dose Route Frequency Provider Last Rate Last Dose  . 0.9 %  sodium chloride infusion   Intravenous Continuous Cammie Sickle, MD   Stopped at 11/05/16 1217  . 0.9 %  sodium chloride infusion   Intravenous Continuous Cammie Sickle, MD   Stopped at 12/03/16 1345  . 0.9 %  sodium chloride infusion   Intravenous Continuous Cammie Sickle, MD 10 mL/hr at 12/05/16 1030    . 0.9 %  sodium chloride infusion   Intravenous Continuous Cammie Sickle, MD   Stopped at 12/24/16 1256  . 0.9 %  sodium chloride infusion   Intravenous Continuous Cammie Sickle, MD   Stopped at 12/26/16 1011  . 0.9 %  sodium chloride infusion   Intravenous Continuous Charlaine Dalton R, MD 10 mL/hr at 12/27/16 0930    . heparin lock flush 100 unit/mL  500 Units Intravenous Once Charlaine Dalton R, MD      . heparin lock flush 100 unit/mL  500 Units Intracatheter Once PRN Charlaine Dalton R, MD      . heparin lock flush 100 unit/mL  500 Units Intravenous Once Charlaine Dalton R, MD      . sodium chloride flush (NS) 0.9 % injection 10 mL  10 mL Intravenous PRN Cammie Sickle, MD   10 mL at 11/06/16 1000  . sodium chloride flush (NS) 0.9 % injection 10 mL  10 mL Intravenous PRN Cammie Sickle, MD   10 mL at 11/07/16 0905  . sodium chloride flush (NS) 0.9 % injection 10 mL  10 mL Intravenous PRN Charlaine Dalton R, MD      . sodium chloride flush (NS) 0.9 % injection 10 mL  10 mL Intracatheter PRN Charlaine Dalton R, MD      . sodium chloride flush (NS) 0.9 % injection 10 mL  10 mL Intravenous PRN Cammie Sickle, MD  10 mL at 12/05/16 1030  . sodium chloride flush (NS) 0.9 % injection 10 mL  10 mL Intravenous PRN Charlaine Dalton R, MD   10 mL at 12/27/16 0930    ECOG PERFORMANCE STATUS:  0 - Asymptomatic  REVIEW OF SYSTEMS: Patient is somewhat weak and frail otherwise Patient denies any weight loss, fatigue, weakness, fever, chills or night sweats. Patient denies any loss of vision, blurred vision. Patient denies any ringing  of the ears or hearing loss. No irregular heartbeat. Patient denies heart murmur or history of fainting. Patient denies any chest pain or pain radiating to her upper extremities. Patient denies any shortness of breath, difficulty breathing at night, cough or hemoptysis. Patient denies any swelling in the lower legs. Patient denies any nausea vomiting, vomiting of blood, or coffee ground material in the vomitus. Patient denies any stomach pain. Patient states has had normal bowel movements no significant constipation or diarrhea. Patient denies any dysuria, hematuria or significant nocturia. Patient denies any  problems walking, swelling in the joints or loss of balance. Patient denies any skin changes, loss of hair or loss of weight. Patient denies any excessive worrying or anxiety or significant depression. Patient denies any problems with insomnia. Patient denies excessive thirst, polyuria, polydipsia. Patient denies any swollen glands, patient denies easy bruising or easy bleeding. Patient denies any recent infections, allergies or URI. Patient "s visual fields have not changed significantly in recent time.    PHYSICAL EXAM: BP 122/68   Pulse 92   Temp 98.3 F (36.8 C)   Resp 18   Ht 5\' 7"  (1.702 m)   Wt 121 lb 5.8 oz (55 kg)   BMI 19.01 kg/m  No peripheral adenopathy is identified no abdominal mass or organomegaly is noted. Well-developed well-nourished patient in NAD. HEENT reveals PERLA, EOMI, discs not visualized.  Oral cavity is clear. No oral mucosal lesions are identified. Neck is clear without evidence of cervical or supraclavicular adenopathy. Lungs are clear to A&P. Cardiac examination is essentially unremarkable with regular rate and rhythm without murmur rub or thrill. Abdomen is benign with no organomegaly or masses noted. Motor sensory and DTR levels are equal and symmetric in the upper and lower extremities. Cranial nerves II through XII are grossly intact. Proprioception is intact. No peripheral adenopathy or edema is identified. No motor or sensory levels are noted. Crude visual fields are within normal range.  LABORATORY DATA: Pathology reports reviewed    RADIOLOGY RESULTS: Serial PET/CT scans reviewed   IMPRESSION: Diffuse B-cell lymphoma status post chemotherapy with excellent response for involved field radiation therapy in 71 year old female  PLAN: At this time I like to go ahead with involved field radiation therapy. Would plan on delivering 3000 cGy in 15 fractions using I M RT radiation therapy. I would choose this form of treatment to spare critical structures such as  her liver spinal cord kidneys and small bowel. Risks and benefits of treatment including possible nausea diarrhea fatigue alteration of blood counts all were discussed in detail with the patient and her husband. I've personally set up and ordered CT simulation after 2 weeks to allow some further recuperation on the patient's part.There will be extra effort by both professional staff as well as technical staff to coordinate and manage concurrent chemoradiation and ensuing side effects during her treatments.  I would like to take this opportunity to thank you for allowing me to participate in the care of your patient.Armstead Peaks., MD

## 2017-01-23 NOTE — Consult Note (Deleted)
Radiation Oncology Follow up Note  Name: Rose Hill   Date:   01/23/2017 MRN:  [id number] DOB: 11/24/1945    This 71 y.o. female presents to the clinic today for evaluation for involved field radiation therapy for diffuse B-cell lymphoma status post chemotherapy with almost complete response.  REFERRING PROVIDER: Rusty Aus, MD  HPI: Patient is a 71 year old female who presented with abdominal complaints weight loss was found to have a large 10 cm mass in the retroperitoneum. 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 She underwent R CHOP chemotherapy followed by.DA-R EPOCH. Initial PET CT scan showed bulky hypermetabolic abdominal retroperitoneal and lymphadenopathy. Repeat PET CT scan after treatment showed small area residual mass only faintly hypermetabolic in the retroperitoneal area. Case was presented weekly tumor conference and recognition for involved field radiation therapy was made. Patient is seen today is quite weak and fatigued from her overall treatment regimen. She specifically denies any abdominal complaints diarrhea dysuria or any other GI/GU complaints.  COMPLICATIONS OF TREATMENT: none  FOLLOW UP COMPLIANCE: keeps appointments   PHYSICAL EXAM:  BP 122/68   Pulse 92   Temp 98.3 F (36.8 C)   Resp 18   Ht 5\' 7"  (1.702 m)   Wt 121 lb 5.8 oz (55 kg)   BMI 19.01 kg/m  Thin female in NAD. No organomegaly is identified. No peripheral adenopathy is identified. Well-developed well-nourished patient in NAD. HEENT reveals PERLA, EOMI, discs not visualized.  Oral cavity is clear. No oral mucosal lesions are identified. Neck is clear without evidence of cervical or supraclavicular adenopathy. Lungs are clear to A&P. Cardiac examination is essentially unremarkable with regular rate and rhythm without murmur rub or thrill. Abdomen is benign with no organomegaly or masses noted. Motor sensory and DTR levels are equal and symmetric in the upper and lower  extremities. Cranial nerves II through XII are grossly intact. Proprioception is intact. No peripheral adenopathy or edema is identified. No motor or sensory levels are noted. Crude visual fields are within normal range.   RADIOLOGY RESULTS: Serial PET CT scans are reviewed  PLAN: At this time I like to go ahead with involved field radiation therapy. I would plan on delivering 3000 cGy in 15 fractions using I am RT radiation therapy to spare critical structures such as spinal cord small bowel kidneys and liver. Risks and benefits of treatment including possible diarrhea fatigue alteration of blood counts skin reaction all were discussed in detail with the patient and her husband. I like to give her another couple weeks to recuperate from her chemotherapy and have scheduled and personally ordered CT simulation at that time. Patient and husband [place] my treatment plan well.  I would like to take this opportunity to thank you for allowing me to participate in the care of your patient.Armstead Peaks., MD

## 2017-01-29 ENCOUNTER — Encounter: Payer: Self-pay | Admitting: Physical Therapy

## 2017-01-29 ENCOUNTER — Ambulatory Visit: Payer: Medicare Other | Attending: Internal Medicine | Admitting: Physical Therapy

## 2017-01-29 DIAGNOSIS — R262 Difficulty in walking, not elsewhere classified: Secondary | ICD-10-CM | POA: Diagnosis present

## 2017-01-29 DIAGNOSIS — M6281 Muscle weakness (generalized): Secondary | ICD-10-CM

## 2017-01-29 NOTE — Patient Instructions (Signed)
Abduction: Clam (Eccentric) - Side-Lying   Lie on side with knees bent. Lift top knee, keeping feet together. Keep trunk steady. Slowly lower for 3-5 seconds. _10__ reps per set, __2_ sets per day, ___ days per week. Add ___ lbs when you achieve ___ repetitions.  HIP: Abduction / External Rotation (Band)   Place band around knees. Lie on side with hips and knees bent. Raise top knee up, squeezing glutes. Keep feet together. Hold ___ seconds. Use _______no_ band. 10___ reps per set, __2_ sets per day, __7_ days per week  Bridge   Lie back, legs bent. Inhale, pressing hips up. Keeping ribs in, lengthen lower back. Exhale, rolling down along spine from top. Repeat ____ times. Do ____ sessions per day.   Bridging (Single Leg)   Lie on back with feet shoulder width apart and right leg straight. Lift hips toward the ceiling while keeping leg straight. Hold 3____ seconds. Repeat _10___ times. Do2 ____ sessions per day.  http://gt2.exer.us/358   EXTENSION: Prone - Knee Flexed (Active)   Lie on stomach, right knee bent to 90. Lift leg toward ceiling. Use _0__ lbs. Complete _2__ sets of _10__ repetitions. Perform 2___ sessions per day.  http://gtsc.exer.us/66   Hip Extension (Prone)   Lift left leg 5____ inches from floor, keeping knee locked. Repeat _10___ times per set. Do ___2_ sets per session. Do __2__ sessions per day.  HIP: Abduction - Side Step (Band)   Place band around ankles. Alternating legs: step out, step out, step in, step in. Repeat, stepping out first with other leg. Hold _3__ seconds. Use __yellow______ band. _10__ reps per set, 2___ sets per day, 2___ days per week Hold onto a support.  Copyright  VHI. All rights reserved.

## 2017-01-29 NOTE — Therapy (Signed)
[place] MAIN [facility] SERVICES [address] [place], [place], [address] Phone: [phone]   Fax:  [phone]  Physical Therapy Evaluation  Patient Details  Name: Rose Hill MRN: [id number] Date of Birth: Nov 14, 1945 Referring Provider: Cammie Sickle  Encounter Date: 01/29/2017      PT End of Session - 01/29/17 1017    Visit Number 1   Number of Visits 17   Date for PT Re-Evaluation April 12, 2017   Authorization Type g codes   Authorization Time Period 1/10   PT Start Time 1005   PT Stop Time 1100   PT Time Calculation (min) 55 min   Equipment Utilized During Treatment Gait belt   Activity Tolerance Patient tolerated treatment well   Behavior During Therapy [facility] for tasks assessed/performed;Flat affect      Past Medical History:  Diagnosis Date  . Diabetes mellitus without complication ([place])   . History of gastric ulcer   . Hypercholesterolemia   . Hypertension   . ILD (interstitial lung disease) ([place])    8 yrs ago  . Lymphadenopathy     Past Surgical History:  Procedure Laterality Date  . BREAST BIOPSY Left 2010   neg  . HERNIA REPAIR    . PARTIAL HYSTERECTOMY     age 35  . PERIPHERAL VASCULAR CATHETERIZATION N/A 08/22/2016   Procedure: [name];  Surgeon: [name], MD;  Location: [facility];  Service: Cardiovascular;  Laterality: N/A;  . TONSILLECTOMY      There were no vitals filed for this visit.       Subjective Assessment - 01/29/17 1010    Subjective Patient has trouble getting up and trouble walking intermediate distances.    Patient is accompained by: Family member   Pertinent History Patient was dx Jul 27 2016. She had had chemo therapy and is having radiation February 13, 2017 for 15 treatments. She has had 5 weeks of chemo. She is feeling weak and having difficulty with with sit to stand and walking.    Limitations Standing;Walking   How long can you stand comfortably? unknown    How long can you walk comfortably? 10 minutes   Patient Stated Goals to be able to walk better and transfer sit to stand better.   Currently in Pain? No/denies   Pain Score 0-No pain            [facility] PT Assessment - 01/29/17 0001      Assessment   Medical Diagnosis Chemotherapy-induced neuropathy, left foot drop   Referring Provider Charlaine Dalton R   Onset Date/Surgical Date 07/27/16   Hand Dominance Left   Next MD Visit july 12,2018   Prior Therapy none     Precautions   Precautions None     Restrictions   Weight Bearing Restrictions No     Balance Screen   Has the patient fallen in the past 6 months No   Has the patient had a decrease in activity level because of a fear of falling?  Yes   Is the patient reluctant to leave their home because of a fear of falling?  Yes     [place] residence   Available Help at Discharge Family   Type of [place] to enter   Entrance Stairs-Number of Steps 2   Entrance Stairs-Rails None   Home Layout One level   Home Equipment None  Prior Function   Level of Independence Independent with basic ADLs;Needs assistance with homemaking   Vocation Retired   Leisure reading,      Cognition   Overall Cognitive Status Within Functional Limits for tasks assessed   Attention Focused       PAIN: no pain  POSTURE:WFL  PROM/AROM: WFL  STRENGTH:  Graded on a 0-5 scale Muscle Group Left Right  Shoulder flex    Shoulder Abd    Shoulder Ext    Shoulder IR/ER    Elbow    Wrist/hand    Hip Flex 3/5 3/5  Hip Abd 3/5 3/5  Hip Add 2/5 25  Hip Ext 2/5 2/5  Hip IR/ER 3/5 3/5  Knee Flex 4/5 4/5  Knee Ext 4/5 4/5  Ankle DF 3/5 3/5  Ankle PF 3/5 3/5   SENSATION: numbness and tingling BLE feet    FUNCTIONAL MOBILITY: independent and slow with bed mobility and supervision for transfers sit to stand with UE support   BALANCE: fair static standing and poor  dynamic standing with inability to perform single leg stand or tandem stand   GAIT: Ambulates without AD with deviation in path, wide turns, wide base of support and slow gait speed  OUTCOME MEASURES: TEST Outcome Interpretation  5 times sit<>stand 20.97sec >8 yo, >15 sec indicates increased risk for falls  10 meter walk test  1.02 m/ sec12. 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    Objective measurements completed on examination: See above findings.    Treatment: Hip 3 way with YTB x 10 BLE Heel raises with UE support x 10 x 2 Patient needs cues for posture and keeping her LE in extension for all exercises              PT Education - 01/29/17 1017    Education provided Yes   Education Details plan of care   Person(s) Educated Patient   Methods Explanation   Comprehension Verbalized understanding             PT Long Term Goals - 01/29/17 1118      PT LONG TERM GOAL #1   Title Patient will be independent in home exercise program to improve strength/mobility for better functional independence with ADLs.   Time 8   Period Weeks   Status New     PT LONG TERM GOAL #2   Title Patient (> 72 years old) will complete five times sit to stand test in < 15 seconds indicating an increased LE strength and improved balance.   Baseline 20.97 sec   Time 8   Period Weeks   Status New     PT LONG TERM GOAL #3   Title Patient will increase six minute walk test distance to >1000 for progression to community ambulator and improve gait ability   Baseline 850 feet   Time 8   Period Weeks   Status New     PT LONG TERM GOAL #4  Title Patient will reduce timed up and go to <11 seconds to reduce fall risk and demonstrate improved  transfer/gait ability.   Baseline 13. 50 sec   Time 8   Period Weeks   Status New     PT LONG TERM GOAL #5   Title Patient will tolerate 5 seconds of single leg stance without loss of balance to improve ability to get in and out of shower safely.   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  She has weakness in BLE  in hips, knees and feet with numbness in BLE feet. Patient has decreased static and dynamic balance balance with decreased outcome measuures indicating a falls risk. She will benefit from skilled PT to improve strength, gait, and decrease her falls risk.    History and Personal Factors relevant to plan of care: This patient presents with 2, personal factors/ comorbidities current situation and coping style, and  4 body elements including body structures and functions, activity limitations and or participation restrictions including decreased strength, decreased sensation, increased falls risk, decreased static and dynamic standing balance, decreased gait. Patient's condition is evolving.   Clinical Presentation Evolving   Clinical Decision Making Moderate   Rehab Potential Good   PT Frequency 2x / week   PT Duration 8 weeks   PT Treatment/Interventions Gait training;Functional mobility training;Therapeutic activities;Therapeutic exercise;Balance training;Neuromuscular re-education   PT Next Visit Plan balance and strengthening   Consulted and Agree with Plan of Care Patient      Patient will benefit from skilled therapeutic intervention in order to improve the following deficits and impairments:  Abnormal gait, Decreased balance, Decreased endurance, Decreased mobility, Difficulty walking, Impaired sensation, Cardiopulmonary  status limiting activity, Decreased activity tolerance, Decreased safety awareness, Decreased strength  Visit Diagnosis: Difficulty in walking, not elsewhere classified  Muscle weakness (generalized)      G-Codes - 2017/02/07 1112    Functional Assessment Tool Used (Outpatient Only) TUG, 5 x sit to stand, 10 MW, 6 MW   Functional Limitation Mobility: Walking and moving around   Mobility: Walking and Moving Around Current Status [phone]) At least 60 percent but less than 80 percent impaired, limited or restricted   Mobility: Walking and Moving Around Goal Status [phone]) At least 40 percent but less than 60 percent impaired, limited or restricted       Problem List Patient Active Problem List   Diagnosis Date Noted  . Dehydration 12/28/2016  . Encounter for monitoring cardiotoxic drug therapy 10/22/2016  . Protein-calorie malnutrition, severe 09/18/2016  . 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 Abdominal mass 07/31/2016  . DM 10/20/2008  . HYPERLIPIDEMIA 10/20/2008  . OBSTRUCTIVE SLEEP APNEA 10/20/2008  . HYPERTENSION 10/20/2008  . ALLERGIC RHINITIS 10/20/2008  . PULMONARY FIBROSIS 10/20/2008   Alanson Puls, PT, DPT [place] 02-07-17, 11:29 AM  [place] MAIN [facility] SERVICES [address] [place], [place], [address] Phone: [phone]   Fax:  [phone]  Name: CLYDEAN POSAS MRN: [id number] Date of Birth: Feb 01, 1946

## 2017-02-01 ENCOUNTER — Other Ambulatory Visit: Payer: Self-pay | Admitting: *Deleted

## 2017-02-05 ENCOUNTER — Ambulatory Visit: Payer: Medicare Other | Admitting: Physical Therapy

## 2017-02-05 ENCOUNTER — Encounter: Payer: Self-pay | Admitting: Physical Therapy

## 2017-02-05 DIAGNOSIS — R262 Difficulty in walking, not elsewhere classified: Secondary | ICD-10-CM | POA: Diagnosis not present

## 2017-02-05 DIAGNOSIS — M6281 Muscle weakness (generalized): Secondary | ICD-10-CM

## 2017-02-05 NOTE — Therapy (Signed)
[place] MAIN [facility] SERVICES [address] [place], [place], [address] Phone: [phone]   Fax:  [phone]  Physical Therapy Treatment  Patient Details  Name: Rose Hill MRN: [id number] Date of Birth: Dec 22, 1945 Referring Provider: Cammie Sickle  Encounter Date: 02/05/2017      PT End of Session - 02/05/17 0925    Visit Number 2   Number of Visits 17   Date for PT Re-Evaluation 19-Apr-2017   Authorization Type g codes   Authorization Time Period 2/10   PT Start Time 0915   PT Stop Time 1000   PT Time Calculation (min) 45 min   Equipment Utilized During Treatment Gait belt   Activity Tolerance Patient tolerated treatment well   Behavior During Therapy [facility] for tasks assessed/performed;Flat affect      Past Medical History:  Diagnosis Date  . Diabetes mellitus without complication ([place])   . History of gastric ulcer   . Hypercholesterolemia   . Hypertension   . ILD (interstitial lung disease) ([place])    8 yrs ago  . Lymphadenopathy     Past Surgical History:  Procedure Laterality Date  . BREAST BIOPSY Left 2010   neg  . HERNIA REPAIR    . PARTIAL HYSTERECTOMY     age 71  . PERIPHERAL VASCULAR CATHETERIZATION N/A 08/22/2016   Procedure: [name];  Surgeon: [name], MD;  Location: [facility];  Service: Cardiovascular;  Laterality: N/A;  . TONSILLECTOMY      There were no vitals filed for this visit.      Subjective Assessment - 02/05/17 0923    Subjective Patient has trouble getting up and trouble walking intermediate distances. She is doing her HEP and has no new concerns,    Patient is accompained by: Family member   Pertinent History Patient was dx Jul 27 2016. She had had chemo therapy and is having radiation February 13, 2017 for 15 treatments. She has had 5 weeks of chemo. She is feeling weak and having difficulty with with sit to stand and walking.    Limitations Standing;Walking   How long can you stand comfortably? unknown   How long can you walk comfortably? 10 minutes   Patient Stated Goals to be able to walk better and transfer sit to stand better.   Currently in Pain? Yes   Pain Score 1    Pain Location Leg   Pain Orientation Right   Pain Descriptors / Indicators Aching   Pain Type Acute pain   Pain Onset Today   Pain Frequency Rarely   Aggravating Factors  unknown   Pain Relieving Factors none   Effect of Pain on Daily Activities none   Multiple Pain Sites No      Therapeutic exercise: Supine hip abd/add x 15 x 2 BLE; cues to keep LE straight and perform exercise slowly Supine SLR x 10 x 2 BLE; cues to tighten quad muscle and perform exercise to 60 deg and stop hooklying hip abd/ER x 20 x 2; cues to tighten TA and perform slowly  Bridging x 10 x 2; cues to raise up high enough and descend slowly Trunk rotation left and right x 20; cues to tighten TA and perform slowly  Hip abd with knee flexed x 20 left and right sidelying clam x 20 left and right  sidelying knee flex / ext left and right x 20 , cues for control  Sitting on theraball with diagonals  and trunk rotation x 20  SAQ with 5 sec hold BLE cues to perform slowly Leg press 60 lbs x 20 x 3, cues to not snap knees and perform slowly Quadriped alternating UE and LE diagonals with 5 sec hold                           PT Education - 02/05/17 0924    Education provided Yes   Education Details plan of care   Person(s) Educated Patient   Methods Explanation   Comprehension Verbalized understanding             PT Long Term Goals - 01/29/17 1118      PT LONG TERM GOAL #1   Title Patient will be independent in home exercise program to improve strength/mobility for better functional independence with ADLs.   Time 8   Period Weeks   Status New     PT LONG TERM GOAL #2   Title Patient (> 69 years old) will complete five times sit to stand test in < 15 seconds  indicating an increased LE strength and improved balance.   Baseline 20.97 sec   Time 8   Period Weeks   Status New     PT LONG TERM GOAL #3   Title Patient will increase six minute walk test distance to >1000 for progression to community ambulator and improve gait ability   Baseline 850 feet   Time 8   Period Weeks   Status New     PT LONG TERM GOAL #4   Title Patient will reduce timed up and go to <11 seconds to reduce fall risk and demonstrate improved transfer/gait ability.   Baseline 13. 50 sec   Time 8   Period Weeks   Status New     PT LONG TERM GOAL #5   Title Patient will tolerate 5 seconds of single leg stance without loss of balance to improve ability to get in and out of shower safely.   Baseline unable to stand single leg stand   Period Weeks   Status New               Plan - 02/05/17 0926    Clinical Impression Statement Patient required min verbal cues to perform 3 way hip with red theraband correctly and required verbal and tactile cues during all strengtneing  activities. Patient demonstrated decreased gait speed, decreased step height, and decreased step length. Patient will continue to benefit from skilled therapy in order to improve dynamic standing balance and increase gait speed  to reduce risk for falls   Rehab Potential Good   PT Frequency 2x / week   PT Duration 8 weeks   PT Treatment/Interventions Gait training;Functional mobility training;Therapeutic activities;Therapeutic exercise;Balance training;Neuromuscular re-education   PT Next Visit Plan balance and strengthening   Consulted and Agree with Plan of Care Patient      Patient will benefit from skilled therapeutic intervention in order to improve the following deficits and impairments:  Abnormal gait, Decreased balance, Decreased endurance, Decreased mobility, Difficulty walking, Impaired sensation, Cardiopulmonary status limiting activity, Decreased activity tolerance, Decreased safety  awareness, Decreased strength  Visit Diagnosis: Difficulty in walking, not elsewhere classified  Muscle weakness (generalized)     Problem List Patient Active Problem List   Diagnosis Date Noted  . Dehydration 12/28/2016  . Encounter for monitoring cardiotoxic drug therapy 10/22/2016  . Protein-calorie malnutrition, severe 09/18/2016  . 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 Abdominal mass 07/31/2016  . DM 10/20/2008  . HYPERLIPIDEMIA 10/20/2008  . OBSTRUCTIVE SLEEP APNEA 10/20/2008  . HYPERTENSION 10/20/2008  . ALLERGIC RHINITIS 10/20/2008  . PULMONARY FIBROSIS 10/20/2008   Alanson Puls, PT, DPT Scott S 02/05/2017, 9:57 AM  [place] MAIN [facility] SERVICES [address] [place], [place], [address] Phone: [phone]   Fax:  [phone]  Name: AARYA ROBINSON MRN: [id number] Date of Birth: [birth year]

## 2017-02-07 ENCOUNTER — Encounter: Payer: Self-pay | Admitting: Physical Therapy

## 2017-02-07 ENCOUNTER — Ambulatory Visit: Payer: Medicare Other | Admitting: Physical Therapy

## 2017-02-07 DIAGNOSIS — M6281 Muscle weakness (generalized): Secondary | ICD-10-CM

## 2017-02-07 DIAGNOSIS — R262 Difficulty in walking, not elsewhere classified: Secondary | ICD-10-CM | POA: Diagnosis not present

## 2017-02-07 NOTE — Therapy (Signed)
[place] MAIN [facility] SERVICES [address] [place], [place], [address] Phone: [phone]   Fax:  [phone]  Physical Therapy Treatment  Patient Details  Name: Rose Hill MRN: [id number] Date of Birth: 25-Oct-1945 Referring Provider: Cammie Sickle  Encounter Date: 02/07/2017      PT End of Session - 02/07/17 1310    Visit Number 3   Number of Visits 17   Date for PT Re-Evaluation 20-Apr-2017   Authorization Type g codes   Authorization Time Period 3/10   PT Start Time 0104   PT Stop Time 0145   PT Time Calculation (min) 41 min   Equipment Utilized During Treatment Gait belt   Activity Tolerance Patient tolerated treatment well   Behavior During Therapy [facility] for tasks assessed/performed;Flat affect      Past Medical History:  Diagnosis Date  . Diabetes mellitus without complication ([place])   . History of gastric ulcer   . Hypercholesterolemia   . Hypertension   . ILD (interstitial lung disease) ([place])    8 yrs ago  . Lymphadenopathy     Past Surgical History:  Procedure Laterality Date  . BREAST BIOPSY Left 2010   neg  . HERNIA REPAIR    . PARTIAL HYSTERECTOMY     age 33  . PERIPHERAL VASCULAR CATHETERIZATION N/A 08/22/2016   Procedure: [name];  Surgeon: [name], MD;  Location: [place] [facility];  Service: Cardiovascular;  Laterality: N/A;  . TONSILLECTOMY      There were no vitals filed for this visit.      Subjective Assessment - 02/07/17 1309    Subjective Patient has trouble getting up and trouble walking intermediate distances. She is doing her HEP and has no new concerns,    Patient is accompained by: Family member   Pertinent History Patient was dx Jul 27 2016. She had had chemo therapy and is having radiation February 13, 2017 for 15 treatments. She has had 5 weeks of chemo. She is feeling weak and having difficulty with with sit to stand and walking.    Limitations Standing;Walking   How long can you stand comfortably? unknown   How long can you walk comfortably? 10 minutes   Patient Stated Goals to be able to walk better and transfer sit to stand better.   Currently in Pain? Yes   Pain Score 3    Pain Location Leg   Pain Orientation Right   Pain Descriptors / Indicators Aching   Pain Type Acute pain   Pain Onset Today   Pain Frequency Occasional   Aggravating Factors  unknown   Pain Relieving Factors none   Effect of Pain on Daily Activities none   Multiple Pain Sites No         THER-EX Standing exercises with 2# ankle weights: Marching 2 x 10; SLR 2 x 10; Abduction 2 x 10; Extension 2 x 10; Knee flexion 2 x 10; Heel raises 2 x 10; BOSU ball lunges x 20 x 2 slde stepping on TM left and right with . 4 Miles / hour 4 square fwd/bwd/ side stepping left and right/ diagonals Star stepping left and right x 10 Tapping on steps x 20  Ascending/descending steps without railing  Side stepping with RTB x 4 lengths Squats x 10 x 2 Heel raises x 10 x 2. CGA and Min to mod verbal cues used throughout with increased in postural sway and LOB most seen with narrow  base of support and while on uneven surfaces. Continues to have balance deficits typical with diagnosis. Patient performs intermediate level exercises without pain behaviors and needs verbal cuing for postural alignment and head positioning Tactile cues and assistance needed to keep lower leg and knee in neutral to avoid compensations with ankle motions.                         PT Education - 02/07/17 1310    Education provided Yes   Education Details correction of exercises with correct technique   Person(s) Educated Patient   Methods Explanation   Comprehension Verbalized understanding             PT Long Term Goals - 01/29/17 1118      PT LONG TERM GOAL #1   Title Patient will be independent in home exercise program to improve strength/mobility for better functional  independence with ADLs.   Time 8   Period Weeks   Status New     PT LONG TERM GOAL #2   Title Patient (> 27 years old) will complete five times sit to stand test in < 15 seconds indicating an increased LE strength and improved balance.   Baseline 20.97 sec   Time 8   Period Weeks   Status New     PT LONG TERM GOAL #3   Title Patient will increase six minute walk test distance to >1000 for progression to community ambulator and improve gait ability   Baseline 850 feet   Time 8   Period Weeks   Status New     PT LONG TERM GOAL #4   Title Patient will reduce timed up and go to <11 seconds to reduce fall risk and demonstrate improved transfer/gait ability.   Baseline 13. 50 sec   Time 8   Period Weeks   Status New     PT LONG TERM GOAL #5   Title Patient will tolerate 5 seconds of single leg stance without loss of balance to improve ability to get in and out of shower safely.   Baseline unable to stand single leg stand   Period Weeks   Status New               Plan - 02/07/17 1311    Clinical Impression Statement Patient required min verbal cues to perform 3 way hip with red theraband correctly and required verbal and tactile cues during all dynamic standing balance activities. Patient demonstrated decreased gait speed, decreased step height, and decreased step length. Patient will continue to benefit from skilled therapy in order to improve dynamic standing balance and increase gait speed to reduce risk for falls   Rehab Potential Good   PT Frequency 2x / week   PT Duration 8 weeks   PT Treatment/Interventions Gait training;Functional mobility training;Therapeutic activities;Therapeutic exercise;Balance training;Neuromuscular re-education   PT Next Visit Plan balance and strengthening   Consulted and Agree with Plan of Care Patient      Patient will benefit from skilled therapeutic intervention in order to improve the following deficits and impairments:  Abnormal  gait, Decreased balance, Decreased endurance, Decreased mobility, Difficulty walking, Impaired sensation, Cardiopulmonary status limiting activity, Decreased activity tolerance, Decreased safety awareness, Decreased strength  Visit Diagnosis: Difficulty in walking, not elsewhere classified  Muscle weakness (generalized)     Problem List Patient Active Problem List   Diagnosis Date Noted  . Dehydration 12/28/2016  . Encounter for monitoring cardiotoxic drug therapy  10/22/2016  . Protein-calorie malnutrition, severe 09/18/2016  . 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 Abdominal mass 07/31/2016  . DM 10/20/2008  . HYPERLIPIDEMIA 10/20/2008  . OBSTRUCTIVE SLEEP APNEA 10/20/2008  . HYPERTENSION 10/20/2008  . ALLERGIC RHINITIS 10/20/2008  . PULMONARY FIBROSIS 10/20/2008    Alanson Puls 02/07/2017, 1:12 PM  [place] MAIN [facility] SERVICES [address] [place], [place], [address] Phone: [phone]   Fax:  [phone]  Name: DISAYA WALT MRN: [id number] Date of Birth: [birth year]

## 2017-02-11 ENCOUNTER — Ambulatory Visit: Payer: Medicare Other | Admitting: Physical Therapy

## 2017-02-11 ENCOUNTER — Encounter: Payer: Self-pay | Admitting: Physical Therapy

## 2017-02-11 DIAGNOSIS — R262 Difficulty in walking, not elsewhere classified: Secondary | ICD-10-CM

## 2017-02-11 DIAGNOSIS — M6281 Muscle weakness (generalized): Secondary | ICD-10-CM

## 2017-02-11 NOTE — Therapy (Signed)
[place] MAIN [facility] SERVICES [address] [place], [place], [address] Phone: [phone]   Fax:  [phone]  Physical Therapy Treatment  Patient Details  Name: Rose Hill MRN: [id number] Date of Birth: 1946/01/11 Referring Provider: Cammie Sickle  Encounter Date: 02/11/2017      PT End of Session - 02/11/17 1112    Visit Number 4   Number of Visits 17   Date for PT Re-Evaluation Mar 28, 2017   Authorization Type g codes   Authorization Time Period 4/10   PT Start Time 1045   PT Stop Time 1125   PT Time Calculation (min) 40 min   Equipment Utilized During Treatment Gait belt   Activity Tolerance Patient tolerated treatment well   Behavior During Therapy [facility] for tasks assessed/performed;Flat affect      Past Medical History:  Diagnosis Date  . Diabetes mellitus without complication ([place])   . History of gastric ulcer   . Hypercholesterolemia   . Hypertension   . ILD (interstitial lung disease) ([place])    8 yrs ago  . Lymphadenopathy     Past Surgical History:  Procedure Laterality Date  . BREAST BIOPSY Left 2010   neg  . HERNIA REPAIR    . PARTIAL HYSTERECTOMY     age 84  . PERIPHERAL VASCULAR CATHETERIZATION N/A 08/22/2016   Procedure: [name];  Surgeon: [name], MD;  Location: [place] [facility];  Service: Cardiovascular;  Laterality: N/A;  . TONSILLECTOMY      There were no vitals filed for this visit.      Subjective Assessment - 02/11/17 1052    Subjective Patient has trouble getting up and trouble walking intermediate distances. She is doing her HEP and has no new concerns,    Patient is accompained by: Family member   Pertinent History Patient was dx Jul 27 2016. She had had chemo therapy and is having radiation February 13, 2017 for 15 treatments. She has had 5 weeks of chemo. She is feeling weak and having difficulty with with sit to stand and walking.    Limitations Standing;Walking   How long can you stand comfortably? unknown   How long can you walk comfortably? 10 minutes   Patient Stated Goals to be able to walk better and transfer sit to stand better.   Pain Score 1    Pain Location Leg   Pain Orientation Right   Pain Descriptors / Indicators Aching   Pain Type Acute pain   Pain Onset Today   Aggravating Factors  unknown   Pain Relieving Factors none   Effect of Pain on Daily Activities none   Multiple Pain Sites No     Therapeutic exercise:  Standing mini squat 2x10;  standing hip abd 2x10;  standing march 2x10;  standing hip extension 2x10;  Prone  Hip ext with knee flex l 2x10 Tall kneeling with 4 lb horizontal trunk rotation x 20 Quadriped alternating UE and LE extension with 5 sec hold x 10 Pt requires mod verbal and tactile cues for proper exercise performance  Sitting clamshell yellow band 2x10; cues for raising LE high and slowly sidelying hip flex / extension x 15 x 2 Supine hip abd/add x 15 x 2 BLE; cues to keep LE straight and perform exercise slowly Supine SLR x 10 x 2 BLE; cues to tighten quad muscle and perform exercise to 60 deg and stop hooklying hip abd/ER x 20 x 2; cues to tighten  TA and perform slowly  Bridging x 10 x 2; cues to raise up high enough and descend slowly   Neuromuscular training:  Matrix 12. 5 lbs x 5 fwd, 5 bwd with min assist and cues to walk slowly backwards and control the speed Standing on AIREX wide BOS 1 min x 3                          PT Education - 02/11/17 1111    Education provided Yes   Education Details correct exercise technique   Person(s) Educated Patient   Methods Explanation   Comprehension Verbalized understanding             PT Long Term Goals - 01/29/17 1118      PT LONG TERM GOAL #1   Title Patient will be independent in home exercise program to improve strength/mobility for better functional independence with ADLs.   Time 8   Period Weeks   Status New      PT LONG TERM GOAL #2   Title Patient (> 69 years old) will complete five times sit to stand test in < 15 seconds indicating an increased LE strength and improved balance.   Baseline 20.97 sec   Time 8   Period Weeks   Status New     PT LONG TERM GOAL #3   Title Patient will increase six minute walk test distance to >1000 for progression to community ambulator and improve gait ability   Baseline 850 feet   Time 8   Period Weeks   Status New     PT LONG TERM GOAL #4   Title Patient will reduce timed up and go to <11 seconds to reduce fall risk and demonstrate improved transfer/gait ability.   Baseline 13. 50 sec   Time 8   Period Weeks   Status New     PT LONG TERM GOAL #5   Title Patient will tolerate 5 seconds of single leg stance without loss of balance to improve ability to get in and out of shower safely.   Baseline unable to stand single leg stand   Period Weeks   Status New               Plan - 02/11/17 1112    Clinical Impression Statement Patient required min verbal cueing during matrix machine stepping, and required CGA during all dynamic standing balance activities. Patient required occasional rest breaks between exercises due to fatigue. Patient tolerated exercise well. Patient will continue to benefit from skilled therapy in order to improve dynamic standing balance activities and increase gait speed to reduce risk for falls   Rehab Potential Good   PT Frequency 2x / week   PT Duration 8 weeks   PT Treatment/Interventions Gait training;Functional mobility training;Therapeutic activities;Therapeutic exercise;Balance training;Neuromuscular re-education   PT Next Visit Plan balance and strengthening   Consulted and Agree with Plan of Care Patient      Patient will benefit from skilled therapeutic intervention in order to improve the following deficits and impairments:  Abnormal gait, Decreased balance, Decreased endurance, Decreased mobility, Difficulty walking,  Impaired sensation, Cardiopulmonary status limiting activity, Decreased activity tolerance, Decreased safety awareness, Decreased strength  Visit Diagnosis: Difficulty in walking, not elsewhere classified  Muscle weakness (generalized)     Problem List Patient Active Problem List   Diagnosis Date Noted  . Dehydration 12/28/2016  . Encounter for monitoring cardiotoxic drug therapy 10/22/2016  . Protein-calorie malnutrition,  severe 09/18/2016  . Diffuse large B-cell lymphoma of intra-abdominal lymph nodes ([place]) 08/30/2016  . Abdominal mass 07/31/2016  . DM 10/20/2008  . HYPERLIPIDEMIA 10/20/2008  . OBSTRUCTIVE SLEEP APNEA 10/20/2008  . HYPERTENSION 10/20/2008  . ALLERGIC RHINITIS 10/20/2008  . PULMONARY FIBROSIS 10/20/2008   Alanson Puls, PT, DPT [place], Minette [place] S 02/11/2017, 11:15 AM  [place] MAIN [facility] SERVICES [address] [place], [place], [address] Phone: [phone]   Fax:  [phone]  Name: Rose Hill MRN: [id number] Date of Birth: 1946-03-27

## 2017-02-13 ENCOUNTER — Encounter: Payer: Self-pay | Admitting: Physical Therapy

## 2017-02-13 ENCOUNTER — Ambulatory Visit: Payer: Medicare Other | Admitting: Physical Therapy

## 2017-02-13 ENCOUNTER — Ambulatory Visit
Admission: RE | Admit: 2017-02-13 | Discharge: 2017-02-13 | Disposition: A | Discharge status: Home / Self Care | Payer: Medicare Other | Source: Ambulatory Visit | Attending: Radiation Oncology | Admitting: Radiation Oncology

## 2017-02-13 DIAGNOSIS — R262 Difficulty in walking, not elsewhere classified: Secondary | ICD-10-CM

## 2017-02-13 DIAGNOSIS — C8333 Diffuse large B-cell lymphoma, intra-abdominal lymph nodes: Secondary | ICD-10-CM | POA: Diagnosis not present

## 2017-02-13 DIAGNOSIS — M6281 Muscle weakness (generalized): Secondary | ICD-10-CM

## 2017-02-13 NOTE — Therapy (Signed)
[place] MAIN [facility] SERVICES [address] [place], [place], [address] Phone: [phone]   Fax:  [phone]  Physical Therapy Treatment  Patient Details  Name: Rose Hill MRN: [id number] Date of Birth: [birth year] Referring Provider: Cammie Sickle  Encounter Date: 02/13/2017      PT End of Session - 02/13/17 0919    Visit Number 5   Number of Visits 17   Date for PT Re-Evaluation 04/04/2017   Authorization Type g codes   Authorization Time Period 5/10   PT Start Time 0915   PT Stop Time 1000   PT Time Calculation (min) 45 min   Equipment Utilized During Treatment Gait belt   Activity Tolerance Patient tolerated treatment well   Behavior During Therapy [facility] for tasks assessed/performed;Flat affect      Past Medical History:  Diagnosis Date  . Diabetes mellitus without complication ([place])   . History of gastric ulcer   . Hypercholesterolemia   . Hypertension   . ILD (interstitial lung disease) ([place])    8 yrs ago  . Lymphadenopathy     Past Surgical History:  Procedure Laterality Date  . BREAST BIOPSY Left 2010   neg  . HERNIA REPAIR    . PARTIAL HYSTERECTOMY     age 71  . PERIPHERAL VASCULAR CATHETERIZATION N/A 08/22/2016   Procedure: [name];  Surgeon: [name], MD;  Location: [place] [facility];  Service: Cardiovascular;  Laterality: N/A;  . TONSILLECTOMY      There were no vitals filed for this visit.      Subjective Assessment - 02/13/17 0918    Subjective Patient has trouble getting up and trouble walking intermediate distances. She is doing her HEP and has no new concerns,    Patient is accompained by: Family member   Pertinent History Patient was dx Jul 27 2016. She had had chemo therapy and is having radiation February 13, 2017 for 15 treatments. She has had 5 weeks of chemo. She is feeling weak and having difficulty with with sit to stand and walking.    Limitations Standing;Walking   How long can you stand comfortably? unknown   How long can you walk comfortably? 10 minutes   Patient Stated Goals to be able to walk better and transfer sit to stand better.   Currently in Pain? Yes   Pain Score 1    Pain Location Leg   Pain Orientation Right   Pain Descriptors / Indicators Aching   Pain Type Acute pain   Pain Onset Today   Pain Frequency Occasional   Aggravating Factors  unknown   Pain Relieving Factors none      Therapeutic exercise: Standing mini squat 2x10;  standing hip abd 2x10;  standing march 2x10;  standing hip extension 2x10;  Prone  Hip ext with knee flex l 2x10 Tall kneeling with 4 lb horizontal trunk rotation x 20 Quadriped alternating UE and LE extension with 5 sec hold x 10 Pt requires mod verbal and tactile cues for proper exercise performance Sitting clamshell yellow band 2x10; cues for raising LE high and slowly sidelying hip flex / extension x 15 x 2 Supine hip abd/add x 15 x 2 BLE; cues to keep LE straight and perform exercise slowly Supine SLR x 10 x 2 BLE; cues to tighten quad muscle and perform exercise to 60 deg and stop hooklying hip abd/ER x 20 x 2; cues to tighten TA and perform slowly  Bridging x 10 x 2; cues to raise up high enough and descend slowly   Neuromuscular training:  four square x 5 fwd, 5 bwd with min assist and cues to walk slowly backwards and control the speed Star stepping left and right x 10 BLE Standing on AIREX narrow BOS 1 min x 3                            PT Education - 02/13/17 0919    Education provided Yes   Education Details progression of HEP   Person(s) Educated Patient   Methods Explanation;Demonstration   Comprehension Verbalized understanding;Returned demonstration;Verbal cues required             PT Long Term Goals - 01/29/17 1118      PT LONG TERM GOAL #1   Title Patient will be independent in home exercise program to improve strength/mobility for better  functional independence with ADLs.   Time 8   Period Weeks   Status New     PT LONG TERM GOAL #2   Title Patient (> 28 years old) will complete five times sit to stand test in < 15 seconds indicating an increased LE strength and improved balance.   Baseline 20.97 sec   Time 8   Period Weeks   Status New     PT LONG TERM GOAL #3   Title Patient will increase six minute walk test distance to >1000 for progression to community ambulator and improve gait ability   Baseline 850 feet   Time 8   Period Weeks   Status New     PT LONG TERM GOAL #4   Title Patient will reduce timed up and go to <11 seconds to reduce fall risk and demonstrate improved transfer/gait ability.   Baseline 13. 50 sec   Time 8   Period Weeks   Status New     PT LONG TERM GOAL #5   Title Patient will tolerate 5 seconds of single leg stance without loss of balance to improve ability to get in and out of shower safely.   Baseline unable to stand single leg stand   Period Weeks   Status New               Plan - 02/13/17 0920    Clinical Impression Statement Focused on advancement of exercises and performing more exercises in closed chain  today to improve LE strength and improving endurance. Patient demonstrates increased postural sway and LOB posteriorly requiring UEs to maintain balance and VCs to shift weight forward. Patient will benefit from further skilled therapy focused on improving strength and balance to return to prior level of function   Rehab Potential Good   PT Frequency 2x / week   PT Duration 8 weeks   PT Treatment/Interventions Gait training;Functional mobility training;Therapeutic activities;Therapeutic exercise;Balance training;Neuromuscular re-education   PT Next Visit Plan balance and strengthening   Consulted and Agree with Plan of Care Patient      Patient will benefit from skilled therapeutic intervention in order to improve the following deficits and impairments:  Abnormal  gait, Decreased balance, Decreased endurance, Decreased mobility, Difficulty walking, Impaired sensation, Cardiopulmonary status limiting activity, Decreased activity tolerance, Decreased safety awareness, Decreased strength  Visit Diagnosis: Difficulty in walking, not elsewhere classified  Muscle weakness (generalized)     Problem List Patient Active Problem List   Diagnosis Date Noted  . Dehydration 12/28/2016  . Encounter for  monitoring cardiotoxic drug therapy 10/22/2016  . Protein-calorie malnutrition, severe 09/18/2016  . 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 Abdominal mass 07/31/2016  . DM 10/20/2008  . HYPERLIPIDEMIA 10/20/2008  . OBSTRUCTIVE SLEEP APNEA 10/20/2008  . HYPERTENSION 10/20/2008  . ALLERGIC RHINITIS 10/20/2008  . PULMONARY FIBROSIS 10/20/2008   Alanson Puls, PT, DPT [place], [place] S 02/13/2017, 9:22 AM  [place] MAIN [facility] SERVICES [address] [place], [place], [address] Phone: [phone]   Fax:  [phone]  Name: CELESTA FUNDERBURK MRN: [id number] Date of Birth: [birth year]

## 2017-02-18 ENCOUNTER — Ambulatory Visit: Payer: Medicare Other | Admitting: Physical Therapy

## 2017-02-19 DIAGNOSIS — C8333 Diffuse large B-cell lymphoma, intra-abdominal lymph nodes: Secondary | ICD-10-CM | POA: Diagnosis not present

## 2017-02-19 NOTE — Telephone Encounter (Signed)
done

## 2017-02-21 ENCOUNTER — Encounter: Payer: Self-pay | Admitting: Physical Therapy

## 2017-02-21 ENCOUNTER — Ambulatory Visit: Payer: Medicare Other | Attending: Internal Medicine | Admitting: Physical Therapy

## 2017-02-21 DIAGNOSIS — M6281 Muscle weakness (generalized): Secondary | ICD-10-CM

## 2017-02-21 DIAGNOSIS — R262 Difficulty in walking, not elsewhere classified: Secondary | ICD-10-CM | POA: Diagnosis not present

## 2017-02-21 NOTE — Therapy (Signed)
[place] MAIN [facility] SERVICES [address] [place], [place], [address] Phone: [phone]   Fax:  [phone]  Physical Therapy Treatment  Patient Details  Name: Rose Hill MRN: [id number] Date of Birth: October 08, 1945 Referring Provider: Cammie Sickle  Encounter Date: 02/21/2017      PT End of Session - 02/21/17 1307    Visit Number 6   Number of Visits 17   Date for PT Re-Evaluation 04/08/2017   Authorization Type g codes   Authorization Time Period 6/10   PT Start Time 0102   PT Stop Time 0145   PT Time Calculation (min) 43 min   Equipment Utilized During Treatment Gait belt   Activity Tolerance Patient tolerated treatment well   Behavior During Therapy [facility] for tasks assessed/performed;Flat affect      Past Medical History:  Diagnosis Date  . Diabetes mellitus without complication ([place])   . History of gastric ulcer   . Hypercholesterolemia   . Hypertension   . ILD (interstitial lung disease) ([place])    8 yrs ago  . Lymphadenopathy     Past Surgical History:  Procedure Laterality Date  . BREAST BIOPSY Left 2010   neg  . HERNIA REPAIR    . PARTIAL HYSTERECTOMY     age 71  . PERIPHERAL VASCULAR CATHETERIZATION N/A 08/22/2016   Procedure: [name];  Surgeon: [name], MD;  Location: [place] [facility];  Service: Cardiovascular;  Laterality: N/A;  . TONSILLECTOMY      There were no vitals filed for this visit.      Subjective Assessment - 02/21/17 1307    Subjective Patient has trouble getting up and trouble walking intermediate distances. She is doing her HEP and has no new concerns,    Patient is accompained by: Family member   Pertinent History Patient was dx Jul 27 2016. She had had chemo therapy and is having radiation February 13, 2017 for 15 treatments. She has had 5 weeks of chemo. She is feeling weak and having difficulty with with sit to stand and walking.    Limitations Standing;Walking    How long can you stand comfortably? unknown   How long can you walk comfortably? 10 minutes   Patient Stated Goals to be able to walk better and transfer sit to stand better.   Currently in Pain? Yes   Pain Score 2    Pain Location Leg   Pain Orientation Right   Pain Descriptors / Indicators Aching   Pain Type Acute pain   Pain Onset Today   Pain Frequency Occasional   Aggravating Factors  unknown   Pain Relieving Factors none   Effect of Pain on Daily Activities none   Multiple Pain Sites No        Therapeutic exercise: Standing mini squat 2x10;  standing hip abd 2x10;  standing march 2x10;  standing hip extension 2x10;  Prone  Hip ext with knee flex l 2x10 Tall kneeling with 4 lb horizontal trunk rotation x 20 Quadriped alternating UE and LE extension with 5 sec hold x 10 Pt requires mod verbal and tactile cues for proper exercise performance Sitting clamshell yellow band 2x10; cues for raising LE high and slowly sidelying hip flex / extension x 15 x 2 Supine hip abd/add x 15 x 2 BLE; cues to keep LE straight and perform exercise slowly Supine SLR x 10 x 2 BLE; cues to tighten quad muscle and perform exercise to 60  deg and stop hooklying hip abd/ER x 20 x 2; cues to tighten TA and perform slowly  Bridging x 10 x 2; cues to raise up high enough and descend slowly   Neuromuscular training:  Matrix 12. 5 lbs x 5 fwd, 5 bwd with min assist and cues to walk slowly backwards and control the speed Standing on AIREX wide BOS 1 min x 3                          PT Education - 02/21/17 1307    Education provided Yes   Education Details HEP   Person(s) Educated Patient   Methods Explanation   Comprehension Verbalized understanding             PT Long Term Goals - 01/29/17 1118      PT LONG TERM GOAL #1   Title Patient will be independent in home exercise program to improve strength/mobility for better functional independence with ADLs.   Time 8    Period Weeks   Status New     PT LONG TERM GOAL #2   Title Patient (> 57 years old) will complete five times sit to stand test in < 15 seconds indicating an increased LE strength and improved balance.   Baseline 20.97 sec   Time 8   Period Weeks   Status New     PT LONG TERM GOAL #3   Title Patient will increase six minute walk test distance to >1000 for progression to community ambulator and improve gait ability   Baseline 850 feet   Time 8   Period Weeks   Status New     PT LONG TERM GOAL #4   Title Patient will reduce timed up and go to <11 seconds to reduce fall risk and demonstrate improved transfer/gait ability.   Baseline 13. 50 sec   Time 8   Period Weeks   Status New     PT LONG TERM GOAL #5   Title Patient will tolerate 5 seconds of single leg stance without loss of balance to improve ability to get in and out of shower safely.   Baseline unable to stand single leg stand   Period Weeks   Status New             Patient will benefit from skilled therapeutic intervention in order to improve the following deficits and impairments:     Visit Diagnosis: Difficulty in walking, not elsewhere classified  Muscle weakness (generalized)     Problem List Patient Active Problem List   Diagnosis Date Noted  . Dehydration 12/28/2016  . Encounter for monitoring cardiotoxic drug therapy 10/22/2016  . Protein-calorie malnutrition, severe 09/18/2016  . 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 Abdominal mass 07/31/2016  . DM 10/20/2008  . HYPERLIPIDEMIA 10/20/2008  . OBSTRUCTIVE SLEEP APNEA 10/20/2008  . HYPERTENSION 10/20/2008  . ALLERGIC RHINITIS 10/20/2008  . PULMONARY FIBROSIS 10/20/2008   Alanson Puls, PT, DPT [place], [place] S 02/21/2017, 1:09 PM  [place] MAIN [facility] SERVICES [address] [place], [place], [address] Phone: [phone]   Fax:   [phone]  Name: Rose Hill MRN: [id number] Date of Birth: Jan 13, 1946

## 2017-02-21 NOTE — Therapy (Signed)
[place] MAIN [facility] SERVICES [address] [place], [place], [address] Phone: [phone]   Fax:  [phone]  Physical Therapy Treatment  Patient Details  Name: Rose Hill MRN: [id number] Date of Birth: Feb 24, 1946 Referring Provider: Cammie Sickle  Encounter Date: 02/21/2017      PT End of Session - 02/21/17 1307    Visit Number 6   Number of Visits 17   Date for PT Re-Evaluation 04-19-2017   Authorization Type g codes   Authorization Time Period 6/10   PT Start Time 0102   PT Stop Time 0145   PT Time Calculation (min) 43 min   Equipment Utilized During Treatment Gait belt   Activity Tolerance Patient tolerated treatment well   Behavior During Therapy [facility] for tasks assessed/performed;Flat affect      Past Medical History:  Diagnosis Date  . Diabetes mellitus without complication ([place])   . History of gastric ulcer   . Hypercholesterolemia   . Hypertension   . ILD (interstitial lung disease) ([place])    8 yrs ago  . Lymphadenopathy     Past Surgical History:  Procedure Laterality Date  . BREAST BIOPSY Left 2010   neg  . HERNIA REPAIR    . PARTIAL HYSTERECTOMY     age 71  . PERIPHERAL VASCULAR CATHETERIZATION N/A 08/22/2016   Procedure: [name];  Surgeon: [name], MD;  Location: [place] [facility];  Service: Cardiovascular;  Laterality: N/A;  . TONSILLECTOMY      There were no vitals filed for this visit.      Subjective Assessment - 02/21/17 1307    Subjective Patient has trouble getting up and trouble walking intermediate distances. She is doing her HEP and has no new concerns,    Patient is accompained by: Family member   Pertinent History Patient was dx Jul 27 2016. She had had chemo therapy and is having radiation February 13, 2017 for 15 treatments. She has had 5 weeks of chemo. She is feeling weak and having difficulty with with sit to stand and walking.    Limitations Standing;Walking   How long can you stand comfortably? unknown   How long can you walk comfortably? 10 minutes   Patient Stated Goals to be able to walk better and transfer sit to stand better.   Currently in Pain? Yes   Pain Score 2    Pain Location Leg   Pain Orientation Right   Pain Descriptors / Indicators Aching   Pain Type Acute pain   Pain Onset Today   Pain Frequency Occasional   Aggravating Factors  unknown   Pain Relieving Factors none   Effect of Pain on Daily Activities none   Multiple Pain Sites No          Nu Step Warm Up Modified side plank 3 x 0 sec. R side weaker than L. Quick fatigue and poor ability to elevate body for proper mechanics Modified plank 3 x 30 sec. Forearms and knees Quadruped bird dog 5 x 5 sec holds. Good core control with minimal sway and flexion/extension of spine. Fatigue on the last rep Tall kneeling diagonals with 4 lbs 15 x each  Tall kneeling rotations with 4 lbs 15 x each  Supine marching with 4 lbs 2 x 15 each Supine bridges 2 x 20  Leg Press 60 lbs 2 x 20 Calf raises on leg press 45 lbs 2 x 20  Rockerboard lateral weight shifts 20x  Rockerboard fwd/backward weight shifts 20x  Smaller rockerboard balance side to side 2 minutes each. Patient depends heavily on visual stabilization to maintain balance but minimally loses balance.  Patient unable to safely perform smaller rockerboard balance fwd/backward direction   Patient able to complete therex without pain and with minimal fatigue                         PT Education - 02/21/17 1307    Education provided Yes   Education Details HEP   Person(s) Educated Patient   Methods Explanation   Comprehension Verbalized understanding             PT Long Term Goals - 01/29/17 1118      PT LONG TERM GOAL #1   Title Patient will be independent in home exercise program to improve strength/mobility for better functional independence with ADLs.   Time 8   Period Weeks   Status New      PT LONG TERM GOAL #2   Title Patient (> 71 years old) will complete five times sit to stand test in < 15 seconds indicating an increased LE strength and improved balance.   Baseline 20.97 sec   Time 8   Period Weeks   Status New     PT LONG TERM GOAL #3   Title Patient will increase six minute walk test distance to >1000 for progression to community ambulator and improve gait ability   Baseline 850 feet   Time 8   Period Weeks   Status New     PT LONG TERM GOAL #4   Title Patient will reduce timed up and go to <11 seconds to reduce fall risk and demonstrate improved transfer/gait ability.   Baseline 13. 50 sec   Time 8   Period Weeks   Status New     PT LONG TERM GOAL #5   Title Patient will tolerate 5 seconds of single leg stance without loss of balance to improve ability to get in and out of shower safely.   Baseline unable to stand single leg stand   Period Weeks   Status New               Plan - 02/21/17 1350    Clinical Impression Statement Today's treatment focused on increasing general strength of UE, core, and LE and was tolerated well by patient.  Patient still demonstrates poor dynamic balance due to weakness and increased in postural sway.  Patient demonstrated better balance on rockerboard in lateral weight shift but was significantly more unsteady in forward/backward direction.  Patient would benefit from skilled physical therapy to focus on improving strength and balance to increase independence with ADLs   Rehab Potential Good   PT Frequency 2x / week   PT Duration 8 weeks   PT Treatment/Interventions Gait training;Functional mobility training;Therapeutic activities;Therapeutic exercise;Balance training;Neuromuscular re-education   PT Next Visit Plan balance and strengthening   PT Home Exercise Plan Continue recreational exercise and HEP    Consulted and Agree with Plan of Care Patient      Patient will benefit from skilled therapeutic intervention  in order to improve the following deficits and impairments:  Abnormal gait, Decreased balance, Decreased endurance, Decreased mobility, Difficulty walking, Impaired sensation, Cardiopulmonary status limiting activity, Decreased activity tolerance, Decreased safety awareness, Decreased strength  Visit Diagnosis: Difficulty in walking, not elsewhere classified  Muscle weakness (generalized)     Problem List Patient Active Problem List  Diagnosis Date Noted  . Dehydration 12/28/2016  . Encounter for monitoring cardiotoxic drug therapy 10/22/2016  . Protein-calorie malnutrition, severe 09/18/2016  . 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 Abdominal mass 07/31/2016  . DM 10/20/2008  . HYPERLIPIDEMIA 10/20/2008  . OBSTRUCTIVE SLEEP APNEA 10/20/2008  . HYPERTENSION 10/20/2008  . ALLERGIC RHINITIS 10/20/2008  . PULMONARY FIBROSIS 10/20/2008   Sheliah Plane SPT This entire session was performed under direct supervision and direction of a licensed therapist/therapist assistant . I have personally read, edited and approve of the note as written. Alanson Puls, PT, DPT [place], [place] S 02/21/2017, 5:50 PM  [place] MAIN [facility] SERVICES [address] [place], [place], [address] Phone: [phone]   Fax:  [phone]  Name: ZIGGY REVELES MRN: [id number] Date of Birth: 08/30/1945

## 2017-02-21 NOTE — Therapy (Incomplete)
[place] MAIN [facility] SERVICES [address] [place], [place], [address] Phone: [phone]   Fax:  [phone]  Physical Therapy Treatment  Patient Details  Name: Rose Hill MRN: [id number] Date of Birth: June 16, 1946 Referring Provider: Cammie Sickle  Encounter Date: 02/21/2017      PT End of Session - 02/21/17 1307    Visit Number 6   Number of Visits 17   Date for PT Re-Evaluation Apr 18, 2017   Authorization Type g codes   Authorization Time Period 6/10   PT Start Time 0102   PT Stop Time 0145   PT Time Calculation (min) 43 min   Equipment Utilized During Treatment Gait belt   Activity Tolerance Patient tolerated treatment well   Behavior During Therapy [facility] for tasks assessed/performed;Flat affect      Past Medical History:  Diagnosis Date  . Diabetes mellitus without complication ([place])   . History of gastric ulcer   . Hypercholesterolemia   . Hypertension   . ILD (interstitial lung disease) ([place])    8 yrs ago  . Lymphadenopathy     Past Surgical History:  Procedure Laterality Date  . BREAST BIOPSY Left 2010   neg  . HERNIA REPAIR    . PARTIAL HYSTERECTOMY     age 19  . PERIPHERAL VASCULAR CATHETERIZATION N/A 08/22/2016   Procedure: [name];  Surgeon: [name], MD;  Location: [facility];  Service: Cardiovascular;  Laterality: N/A;  . TONSILLECTOMY      There were no vitals filed for this visit.      Subjective Assessment - 02/21/17 1307    Subjective Patient has trouble getting up and trouble walking intermediate distances. She is doing her HEP and has no new concerns,    Patient is accompained by: Family member   Pertinent History Patient was dx Jul 27 2016. She had had chemo therapy and is having radiation February 13, 2017 for 15 treatments. She has had 5 weeks of chemo. She is feeling weak and having difficulty with with sit to stand and walking.    Limitations Standing;Walking   How long can you stand comfortably? unknown   How long can you walk comfortably? 10 minutes   Patient Stated Goals to be able to walk better and transfer sit to stand better.   Currently in Pain? Yes   Pain Score 2    Pain Location Leg   Pain Orientation Right   Pain Descriptors / Indicators Aching   Pain Type Acute pain   Pain Onset Today   Pain Frequency Occasional   Aggravating Factors  unknown   Pain Relieving Factors none   Effect of Pain on Daily Activities none   Multiple Pain Sites No          Nu Step Warm Up Modified side plank 3 x 0 sec. R side weaker than L. Quick fatigue and poor ability to elevate body for proper mechanics Modified plank 3 x 30 sec. Forearms and knees Quadruped bird dog 5 x 5 sec holds. Good core control with minimal sway and flexion/extension of spine. Fatigue on the last rep Tall kneeling diagonals with 4 lbs 15 x each  Tall kneeling rotations with 4 lbs 15 x each  Supine marching with 4 lbs 2 x 15 each Supine bridges 2 x 20  Leg Press 60 lbs 2 x 20 Calf raises on leg press 45 lbs 2 x 20  Rockerboard lateral weight shifts 20x  Rockerboard fwd/backward weight shifts 20x  Smaller rockerboard balance side to side 2 minutes each. Patient depends heavily on visual stabilization to maintain balance but minimally loses balance.  Patient unable to safely perform smaller rockerboard balance fwd/backward direction   Patient able to complete therex without pain and with minimal fatigue                         PT Education - 02/21/17 1307    Education provided Yes   Education Details HEP   Person(s) Educated Patient   Methods Explanation   Comprehension Verbalized understanding             PT Long Term Goals - 01/29/17 1118      PT LONG TERM GOAL #1   Title Patient will be independent in home exercise program to improve strength/mobility for better functional independence with ADLs.   Time 8   Period Weeks   Status New      PT LONG TERM GOAL #2   Title Patient (> 63 years old) will complete five times sit to stand test in < 15 seconds indicating an increased LE strength and improved balance.   Baseline 20.97 sec   Time 8   Period Weeks   Status New     PT LONG TERM GOAL #3   Title Patient will increase six minute walk test distance to >1000 for progression to community ambulator and improve gait ability   Baseline 850 feet   Time 8   Period Weeks   Status New     PT LONG TERM GOAL #4   Title Patient will reduce timed up and go to <11 seconds to reduce fall risk and demonstrate improved transfer/gait ability.   Baseline 13. 50 sec   Time 8   Period Weeks   Status New     PT LONG TERM GOAL #5   Title Patient will tolerate 5 seconds of single leg stance without loss of balance to improve ability to get in and out of shower safely.   Baseline unable to stand single leg stand   Period Weeks   Status New             Patient will benefit from skilled therapeutic intervention in order to improve the following deficits and impairments:     Visit Diagnosis: Difficulty in walking, not elsewhere classified  Muscle weakness (generalized)     Problem List Patient Active Problem List   Diagnosis Date Noted  . Dehydration 12/28/2016  . Encounter for monitoring cardiotoxic drug therapy 10/22/2016  . Protein-calorie malnutrition, severe 09/18/2016  . 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 Abdominal mass 07/31/2016  . DM 10/20/2008  . HYPERLIPIDEMIA 10/20/2008  . OBSTRUCTIVE SLEEP APNEA 10/20/2008  . HYPERTENSION 10/20/2008  . ALLERGIC RHINITIS 10/20/2008  . PULMONARY FIBROSIS 10/20/2008   Sheliah Plane SPT  Pam Drown 02/21/2017, 1:21 PM  [place] MAIN [facility] SERVICES [address] [place], [place], [address] Phone: [phone]   Fax:  [phone]  Name: Rose Hill MRN: [id number] Date of  Birth: 1946/05/27

## 2017-02-22 ENCOUNTER — Other Ambulatory Visit: Payer: Self-pay | Admitting: *Deleted

## 2017-02-22 DIAGNOSIS — C8333 Diffuse large B-cell lymphoma, intra-abdominal lymph nodes: Secondary | ICD-10-CM

## 2017-02-25 ENCOUNTER — Ambulatory Visit: Payer: Medicare Other | Admitting: Physical Therapy

## 2017-02-25 ENCOUNTER — Ambulatory Visit
Admission: RE | Admit: 2017-02-25 | Discharge: 2017-02-25 | Disposition: A | Discharge status: Home / Self Care | Payer: Medicare Other | Source: Ambulatory Visit | Attending: Radiation Oncology | Admitting: Radiation Oncology

## 2017-02-26 ENCOUNTER — Ambulatory Visit
Admission: RE | Admit: 2017-02-26 | Discharge: 2017-02-26 | Disposition: A | Discharge status: Home / Self Care | Payer: Medicare Other | Source: Ambulatory Visit | Attending: Radiation Oncology | Admitting: Radiation Oncology

## 2017-02-26 DIAGNOSIS — C8333 Diffuse large B-cell lymphoma, intra-abdominal lymph nodes: Secondary | ICD-10-CM | POA: Diagnosis not present

## 2017-02-27 ENCOUNTER — Encounter: Payer: Self-pay | Admitting: Physical Therapy

## 2017-02-27 ENCOUNTER — Ambulatory Visit
Admission: RE | Admit: 2017-02-27 | Discharge: 2017-02-27 | Disposition: A | Discharge status: Home / Self Care | Payer: Medicare Other | Source: Ambulatory Visit | Attending: Radiation Oncology | Admitting: Radiation Oncology

## 2017-02-27 ENCOUNTER — Ambulatory Visit: Payer: Medicare Other | Admitting: Physical Therapy

## 2017-02-27 DIAGNOSIS — R262 Difficulty in walking, not elsewhere classified: Secondary | ICD-10-CM | POA: Diagnosis not present

## 2017-02-27 DIAGNOSIS — C8333 Diffuse large B-cell lymphoma, intra-abdominal lymph nodes: Secondary | ICD-10-CM | POA: Diagnosis not present

## 2017-02-27 DIAGNOSIS — M6281 Muscle weakness (generalized): Secondary | ICD-10-CM

## 2017-02-27 NOTE — Therapy (Signed)
[place] MAIN [facility] SERVICES [address] [place], [place], [address] Phone: [phone]   Fax:  [phone]  Physical Therapy Treatment  Patient Details  Name: Rose Hill MRN: [id number] Date of Birth: October 06, 1945 Referring Provider: Cammie Sickle  Encounter Date: 02/27/2017      PT End of Session - 02/27/17 1309    Visit Number 7   Number of Visits 17   Date for PT Re-Evaluation 04/20/2017   Authorization Type g codes 7/10   PT Start Time 1300   PT Stop Time 1345   PT Time Calculation (min) 45 min   Equipment Utilized During Treatment Gait belt   Activity Tolerance Patient limited by fatigue   Behavior During Therapy [facility] for tasks assessed/performed;Flat affect      Past Medical History:  Diagnosis Date  . Diabetes mellitus without complication ([place])   . History of gastric ulcer   . Hypercholesterolemia   . Hypertension   . ILD (interstitial lung disease) ([place])    8 yrs ago  . Lymphadenopathy     Past Surgical History:  Procedure Laterality Date  . BREAST BIOPSY Left 2010   neg  . HERNIA REPAIR    . PARTIAL HYSTERECTOMY     age 4  . PERIPHERAL VASCULAR CATHETERIZATION N/A 08/22/2016   Procedure: [name];  Surgeon: [name], MD;  Location: [facility];  Service: Cardiovascular;  Laterality: N/A;  . TONSILLECTOMY      There were no vitals filed for this visit.      Subjective Assessment - 02/27/17 1308    Subjective Patient states feeling low on energy due to chemo treatment today   Pertinent History Patient was dx Jul 27 2016. She had had chemo therapy and is having radiation February 13, 2017 for 15 treatments. She has had 5 weeks of chemo. She is feeling weak and having difficulty with with sit to stand and walking.    Limitations Standing;Walking   Currently in Pain? Yes   Pain Score 2    Pain Location Leg   Pain Orientation Right   Pain Descriptors / Indicators Aching   Pain  Type Acute pain   Pain Onset Today   Multiple Pain Sites No        Treatment:  Nu Step warm up 10 minutes   Lunges onto bosu ball 20 x each leg. Patient required moderate cueing to not use UE assist but was resolved towards last few reps with good postural control. Calf raises at // bars 2 x 20. Mild fatigue at end of exercises. Leg press 60 lbs 2 x 40  Mini-push ups on high-low table 2 x10. Poor trunk and scapular control. Moderate cues for proper abdominal activation. No pain.  Semi tandem balance on purple foam with head turns 2 min each. Patient showed good balance control with head turns. SLS on purple foam 1 min each. Patient was able to complete with minimal to no LOB   Overall good strength and balance shown today with one rest break.                        PT Education - 02/27/17 1309    Education provided Yes   Education Details HEP and energy conservation techniques    Person(s) Educated Patient   Methods Explanation   Comprehension Verbalized understanding             PT Long  Term Goals - 01/29/17 1118      PT LONG TERM GOAL #1   Title Patient will be independent in home exercise program to improve strength/mobility for better functional independence with ADLs.   Time 8   Period Weeks   Status New     PT LONG TERM GOAL #2   Title Patient (> 69 years old) will complete five times sit to stand test in < 15 seconds indicating an increased LE strength and improved balance.   Baseline 20.97 sec   Time 8   Period Weeks   Status New     PT LONG TERM GOAL #3   Title Patient will increase six minute walk test distance to >1000 for progression to community ambulator and improve gait ability   Baseline 850 feet   Time 8   Period Weeks   Status New     PT LONG TERM GOAL #4   Title Patient will reduce timed up and go to <11 seconds to reduce fall risk and demonstrate improved transfer/gait ability.   Baseline 13. 50 sec   Time 8    Period Weeks   Status New     PT LONG TERM GOAL #5   Title Patient will tolerate 5 seconds of single leg stance without loss of balance to improve ability to get in and out of shower safely.   Baseline unable to stand single leg stand   Period Weeks   Status New               Plan - 02/27/17 1346    Clinical Impression Statement Patient tolerated LE strengthening emphasized program today with no increase in pain or significant LOB failures.  New therex were tolerated well but patient demonstrates poor trunk control with push up therex. Patient demonstrated good ankle strategy during balance therex and is ready to be challenged more once fatigue from chemotherapy decreases.  Patient will continue to benefit from balance and strength training in physical therapy to increase independence with ADLs/.   Rehab Potential Good   PT Frequency 2x / week   PT Duration 8 weeks   PT Treatment/Interventions Gait training;Functional mobility training;Therapeutic activities;Therapeutic exercise;Balance training;Neuromuscular re-education   PT Next Visit Plan balance and strengthening   PT Home Exercise Plan Continue recreational exercise and HEP    Consulted and Agree with Plan of Care Patient      Patient will benefit from skilled therapeutic intervention in order to improve the following deficits and impairments:  Abnormal gait, Decreased balance, Decreased endurance, Decreased mobility, Difficulty walking, Impaired sensation, Cardiopulmonary status limiting activity, Decreased activity tolerance, Decreased safety awareness, Decreased strength  Visit Diagnosis: Difficulty in walking, not elsewhere classified  Muscle weakness (generalized)     Problem List Patient Active Problem List   Diagnosis Date Noted  . Dehydration 12/28/2016  . Encounter for monitoring cardiotoxic drug therapy 10/22/2016  . Protein-calorie malnutrition, severe 09/18/2016  . Diffuse large B-cell lymphoma of  intra-abdominal lymph nodes ([place]) 08/30/2016  . Abdominal mass 07/31/2016  . DM 10/20/2008  . HYPERLIPIDEMIA 10/20/2008  . OBSTRUCTIVE SLEEP APNEA 10/20/2008  . HYPERTENSION 10/20/2008  . ALLERGIC RHINITIS 10/20/2008  . PULMONARY FIBROSIS 10/20/2008   This entire session was performed under direct supervision and direction of a licensed therapist/therapist assistant . I have personally read, edited and approve of the note as written. Arelia Sneddon S 02/27/2017, 3:34 PM  Alanson Puls, PT, DPT  [place] MAIN [facility] SERVICES [phone]  [place], [place], [address] Phone: [phone]   Fax:  [phone]  Name: Rose Hill MRN: [id number] Date of Birth: [birth year]

## 2017-02-28 ENCOUNTER — Ambulatory Visit
Admission: RE | Admit: 2017-02-28 | Discharge: 2017-02-28 | Disposition: A | Discharge status: Home / Self Care | Payer: Medicare Other | Source: Ambulatory Visit | Attending: Radiation Oncology | Admitting: Radiation Oncology

## 2017-02-28 DIAGNOSIS — C8333 Diffuse large B-cell lymphoma, intra-abdominal lymph nodes: Secondary | ICD-10-CM | POA: Diagnosis not present

## 2017-03-01 ENCOUNTER — Ambulatory Visit
Admission: RE | Admit: 2017-03-01 | Discharge: 2017-03-01 | Disposition: A | Discharge status: Home / Self Care | Payer: Medicare Other | Source: Ambulatory Visit | Attending: Radiation Oncology | Admitting: Radiation Oncology

## 2017-03-01 DIAGNOSIS — C8333 Diffuse large B-cell lymphoma, intra-abdominal lymph nodes: Secondary | ICD-10-CM | POA: Diagnosis not present

## 2017-03-04 ENCOUNTER — Ambulatory Visit
Admission: RE | Admit: 2017-03-04 | Discharge: 2017-03-04 | Disposition: A | Discharge status: Home / Self Care | Payer: Medicare Other | Source: Ambulatory Visit | Attending: Radiation Oncology | Admitting: Radiation Oncology

## 2017-03-04 ENCOUNTER — Ambulatory Visit: Payer: Medicare Other

## 2017-03-04 ENCOUNTER — Encounter: Payer: Self-pay | Admitting: Physical Therapy

## 2017-03-04 VITALS — BP 123/50 | HR 72

## 2017-03-04 DIAGNOSIS — R262 Difficulty in walking, not elsewhere classified: Secondary | ICD-10-CM | POA: Diagnosis not present

## 2017-03-04 DIAGNOSIS — C8333 Diffuse large B-cell lymphoma, intra-abdominal lymph nodes: Secondary | ICD-10-CM | POA: Diagnosis not present

## 2017-03-04 DIAGNOSIS — M6281 Muscle weakness (generalized): Secondary | ICD-10-CM

## 2017-03-04 NOTE — Therapy (Signed)
[place] MAIN [facility] SERVICES [address] [place], [place], [address] Phone: [phone]   Fax:  [phone]  Physical Therapy Treatment  Patient Details  Name: Rose Hill MRN: [id number] Date of Birth: 05-06-1946 Referring Provider: Cammie Sickle  Encounter Date: 03/04/2017      PT End of Session - 03/04/17 1023    Visit Number 8   Number of Visits 17   Date for PT Re-Evaluation 04/10/2017   Authorization Type g codes 8/10   PT Start Time 7408   PT Stop Time 1100   PT Time Calculation (min) 45 min   Equipment Utilized During Treatment Gait belt   Activity Tolerance Patient limited by fatigue   Behavior During Therapy [facility] for tasks assessed/performed      Past Medical History:  Diagnosis Date  . Diabetes mellitus without complication ([place])   . History of gastric ulcer   . Hypercholesterolemia   . Hypertension   . ILD (interstitial lung disease) ([place])    8 yrs ago  . Lymphadenopathy     Past Surgical History:  Procedure Laterality Date  . BREAST BIOPSY Left 2010   neg  . HERNIA REPAIR    . PARTIAL HYSTERECTOMY     age 35  . PERIPHERAL VASCULAR CATHETERIZATION N/A 08/22/2016   Procedure: [name];  Surgeon: [name], MD;  Location: [facility];  Service: Cardiovascular;  Laterality: N/A;  . TONSILLECTOMY      Vitals:   03/04/17 1019  BP: (!) 123/50  Pulse: 72  SpO2: 98%        Subjective Assessment - 03/04/17 1021    Subjective Patient feels okay and is a little tired from the radiation treatment this morning.   Patient is accompained by: Family member   Pertinent History Patient was dx Jul 27 2016. She had had chemo therapy and is having radiation February 13, 2017 for 15 treatments. She has had 5 weeks of chemo. She is feeling weak and having difficulty with with sit to stand and walking.    Limitations Standing;Walking   How long can you stand comfortably? unknown   How long can  you walk comfortably? 10 minutes   Patient Stated Goals to be able to walk better and transfer sit to stand better.   Currently in Pain? Yes   Pain Score 3    Pain Location Leg   Pain Orientation Left   Pain Type Chronic pain   Pain Onset 1 to 4 weeks ago   Pain Frequency Constant   Aggravating Factors  unknown   Pain Relieving Factors none   Effect of Pain on Daily Activities none   Multiple Pain Sites No       TREATMENT  Nu Step warm up 5 minutes  Tandem balance eyes closed 2 min each SLS/step ups on yellow stone 15 x each leg.  Patient was able to noT use UE assist and balance for approximately 2 seconds per repitition Sidesteps with RTB around knees 10 laps.  Patient displayed good balance but heavily relies on vision for stability.  DL leg press 75 lbs 30x DL leg press 90 lbs 2 x 7. Good fatigue here. Occasional crepitus in R knee during exercise. Patient complains of constant 2/10 pain distal to L ischial tuberosity; suspected mild muscle strain. Standing heel raises 3 x 15  PT Education - 03/04/17 1112    Education provided Yes   Education Details Challenging balance to decrease visual dependency    Person(s) Educated Patient   Methods Explanation   Comprehension Verbalized understanding             PT Long Term Goals - 01/29/17 1118      PT LONG TERM GOAL #1   Title Patient will be independent in home exercise program to improve strength/mobility for better functional independence with ADLs.   Time 8   Period Weeks   Status New     PT LONG TERM GOAL #2   Title Patient (> 39 years old) will complete five times sit to stand test in < 15 seconds indicating an increased LE strength and improved balance.   Baseline 20.97 sec   Time 8   Period Weeks   Status New     PT LONG TERM GOAL #3   Title Patient will increase six minute walk test distance to >1000 for progression to community ambulator and improve gait  ability   Baseline 850 feet   Time 8   Period Weeks   Status New     PT LONG TERM GOAL #4   Title Patient will reduce timed up and go to <11 seconds to reduce fall risk and demonstrate improved transfer/gait ability.   Baseline 13. 50 sec   Time 8   Period Weeks   Status New     PT LONG TERM GOAL #5   Title Patient will tolerate 5 seconds of single leg stance without loss of balance to improve ability to get in and out of shower safely.   Baseline unable to stand single leg stand   Period Weeks   Status New               Plan - 03/04/17 1105    Clinical Impression Statement Patient still relies heavily on vision for balance stability and experienced good challenge today when eyes were closed during therex.  Patient demonstrates good endurance given her circumstances and is able to complete therapy with minimal rest breaks.  Patient was able to perform increased weight on leg press but complains of a constant pain at origin of L hamstring.  Patient still complains of constant neuropathy sx on L leg but does not get worse with movment.  Patient will continue to benefit from skilled therapy to improve strength and endurance to reduce risk of falls.   Rehab Potential Good   PT Frequency 2x / week   PT Duration 8 weeks   PT Treatment/Interventions Gait training;Functional mobility training;Therapeutic activities;Therapeutic exercise;Balance training;Neuromuscular re-education   PT Next Visit Plan balance and strengthening   PT Home Exercise Plan Continue recreational exercise and HEP    Consulted and Agree with Plan of Care Patient      Patient will benefit from skilled therapeutic intervention in order to improve the following deficits and impairments:  Abnormal gait, Decreased balance, Decreased endurance, Decreased mobility, Difficulty walking, Impaired sensation, Cardiopulmonary status limiting activity, Decreased activity tolerance, Decreased safety awareness, Decreased  strength  Visit Diagnosis: Difficulty in walking, not elsewhere classified  Muscle weakness (generalized)     Problem List Patient Active Problem List   Diagnosis Date Noted  . Dehydration 12/28/2016  . Encounter for monitoring cardiotoxic drug therapy 10/22/2016  . Protein-calorie malnutrition, severe 09/18/2016  . 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 Abdominal mass 07/31/2016  . DM 10/20/2008  .  HYPERLIPIDEMIA 10/20/2008  . OBSTRUCTIVE SLEEP APNEA 10/20/2008  . HYPERTENSION 10/20/2008  . ALLERGIC RHINITIS 10/20/2008  . PULMONARY FIBROSIS 10/20/2008    This entire session was performed under direct supervision and direction of a licensed therapist/therapist assistant . I have personally read, edited and approve of the note as written.   Edrick Whitehorn SPT Phillips Grout PT, DPT   Huprich,Jason 03/04/2017, 2:41 PM  [place] MAIN [facility] SERVICES [address] [place], [place], [address] Phone: [phone]   Fax:  [phone]  Name: Rose NARAYANAN MRN: [id number] Date of Birth: [birth year]

## 2017-03-05 ENCOUNTER — Ambulatory Visit
Admission: RE | Admit: 2017-03-05 | Discharge: 2017-03-05 | Disposition: A | Discharge status: Home / Self Care | Payer: Medicare Other | Source: Ambulatory Visit | Attending: Radiation Oncology | Admitting: Radiation Oncology

## 2017-03-05 DIAGNOSIS — C8333 Diffuse large B-cell lymphoma, intra-abdominal lymph nodes: Secondary | ICD-10-CM | POA: Diagnosis not present

## 2017-03-06 ENCOUNTER — Inpatient Hospital Stay: Payer: Medicare Other | Attending: Internal Medicine

## 2017-03-06 ENCOUNTER — Ambulatory Visit: Payer: Medicare Other

## 2017-03-06 ENCOUNTER — Ambulatory Visit
Admission: RE | Admit: 2017-03-06 | Discharge: 2017-03-06 | Disposition: A | Discharge status: Home / Self Care | Payer: Medicare Other | Source: Ambulatory Visit | Attending: Radiation Oncology | Admitting: Radiation Oncology

## 2017-03-06 DIAGNOSIS — Z923 Personal history of irradiation: Secondary | ICD-10-CM | POA: Diagnosis not present

## 2017-03-06 DIAGNOSIS — Z79899 Other long term (current) drug therapy: Secondary | ICD-10-CM | POA: Insufficient documentation

## 2017-03-06 DIAGNOSIS — M6281 Muscle weakness (generalized): Secondary | ICD-10-CM

## 2017-03-06 DIAGNOSIS — R591 Generalized enlarged lymph nodes: Secondary | ICD-10-CM | POA: Diagnosis not present

## 2017-03-06 DIAGNOSIS — C8333 Diffuse large B-cell lymphoma, intra-abdominal lymph nodes: Secondary | ICD-10-CM | POA: Insufficient documentation

## 2017-03-06 DIAGNOSIS — E119 Type 2 diabetes mellitus without complications: Secondary | ICD-10-CM | POA: Insufficient documentation

## 2017-03-06 DIAGNOSIS — R262 Difficulty in walking, not elsewhere classified: Secondary | ICD-10-CM | POA: Diagnosis not present

## 2017-03-06 DIAGNOSIS — R000 Tachycardia, unspecified: Secondary | ICD-10-CM | POA: Insufficient documentation

## 2017-03-06 DIAGNOSIS — G629 Polyneuropathy, unspecified: Secondary | ICD-10-CM | POA: Insufficient documentation

## 2017-03-06 DIAGNOSIS — Z7984 Long term (current) use of oral hypoglycemic drugs: Secondary | ICD-10-CM | POA: Diagnosis not present

## 2017-03-06 DIAGNOSIS — E7800 Pure hypercholesterolemia, unspecified: Secondary | ICD-10-CM | POA: Diagnosis not present

## 2017-03-06 DIAGNOSIS — Z8711 Personal history of peptic ulcer disease: Secondary | ICD-10-CM | POA: Diagnosis not present

## 2017-03-06 DIAGNOSIS — I10 Essential (primary) hypertension: Secondary | ICD-10-CM | POA: Insufficient documentation

## 2017-03-06 DIAGNOSIS — Z87891 Personal history of nicotine dependence: Secondary | ICD-10-CM | POA: Diagnosis not present

## 2017-03-06 DIAGNOSIS — E876 Hypokalemia: Secondary | ICD-10-CM | POA: Insufficient documentation

## 2017-03-06 LAB — CBC
HCT: 34.5 % — ABNORMAL LOW (ref 35.0–47.0)
HEMOGLOBIN: 11.7 g/dL — AB (ref 12.0–16.0)
MCH: 29.7 pg (ref 26.0–34.0)
MCHC: 33.8 g/dL (ref 32.0–36.0)
MCV: 87.9 fL (ref 80.0–100.0)
Platelets: 177 10*3/uL (ref 150–440)
RBC: 3.92 MIL/uL (ref 3.80–5.20)
RDW: 16.3 % — ABNORMAL HIGH (ref 11.5–14.5)
WBC: 4.2 10*3/uL (ref 3.6–11.0)

## 2017-03-06 NOTE — Therapy (Signed)
[place] MAIN [facility] SERVICES [address] [place], [place], [address] Phone: [phone]   Fax:  [phone]  Physical Therapy Treatment  Patient Details  Name: Rose Hill MRN: [id number] Date of Birth: 1945-11-03 Referring Provider: Cammie Sickle  Encounter Date: 03/06/2017      PT End of Session - 03/06/17 1029    Visit Number 9   Number of Visits 17   Date for PT Re-Evaluation 2017-04-09   Authorization Type g codes 2023/05/14   PT Start Time 1020   PT Stop Time 1105   PT Time Calculation (min) 45 min   Equipment Utilized During Treatment Gait belt   Activity Tolerance Patient limited by fatigue   Behavior During Therapy [employer] for tasks assessed/performed      Past Medical History:  Diagnosis Date  . Diabetes mellitus without complication ([place])   . History of gastric ulcer   . Hypercholesterolemia   . Hypertension   . ILD (interstitial lung disease) ([place])    8 yrs ago  . Lymphadenopathy     Past Surgical History:  Procedure Laterality Date  . BREAST BIOPSY Left 2010   neg  . HERNIA REPAIR    . PARTIAL HYSTERECTOMY     age 64  . PERIPHERAL VASCULAR CATHETERIZATION N/A 08/22/2016   Procedure: [name];  Surgeon: [name], MD;  Location: [place] [facility];  Service: Cardiovascular;  Laterality: N/A;  . TONSILLECTOMY      There were no vitals filed for this visit.      Subjective Assessment - 03/06/17 1027    Subjective Patient is a little tired today and L leg still hurts   Patient is accompained by: Family member   Pertinent History Patient was dx Jul 27 2016. She had had chemo therapy and is having radiation February 13, 2017 for 15 treatments. She has had 5 weeks of chemo. She is feeling weak and having difficulty with with sit to stand and walking.    Limitations Standing;Walking   How long can you stand comfortably? unknown   How long can you walk comfortably? 10 minutes   Patient Stated Goals  to be able to walk better and transfer sit to stand better.   Currently in Pain? Yes   Pain Score 3    Pain Location Leg   Pain Orientation Left   Pain Descriptors / Indicators Aching   Pain Type Chronic pain   Pain Onset 1 to 4 weeks ago   Pain Frequency Constant   Aggravating Factors  unknown   Pain Relieving Factors none   Effect of Pain on Daily Activities none   Multiple Pain Sites No         Treatment:  Nu Step warm up 5 minutes  DL leg press 75 lbs 3 x 15. SL leg press 35 lbs 10x each. Less knee crepitus than DL Matrix sling sideways ambulation 12.5 lbs 4 laps each. Mild LOB when going to her R; got better with repetitions. More fatigue in LLE. Tandem balance on purple foam 2 min each with head turns. More LOB L>R. CGA for safety, but patient was able to catch themselves Forward lunges onto Bosu 15x each.  Patient was able to avoid UE assistance but takes prolonged time to complete.  Verbal cues improved knee stability during exercise.                        PT  Education - 03/06/17 1028    Education provided Yes   Education Details Progress therex to challenge dynamic balance   Person(s) Educated Patient   Methods Explanation   Comprehension Verbalized understanding             PT Long Term Goals - 01/29/17 1118      PT LONG TERM GOAL #1   Title Patient will be independent in home exercise program to improve strength/mobility for better functional independence with ADLs.   Time 8   Period Weeks   Status New     PT LONG TERM GOAL #2   Title Patient (> 23 years old) will complete five times sit to stand test in < 15 seconds indicating an increased LE strength and improved balance.   Baseline 20.97 sec   Time 8   Period Weeks   Status New     PT LONG TERM GOAL #3   Title Patient will increase six minute walk test distance to >1000 for progression to community ambulator and improve gait ability   Baseline 850 feet   Time 8   Period  Weeks   Status New     PT LONG TERM GOAL #4   Title Patient will reduce timed up and go to <11 seconds to reduce fall risk and demonstrate improved transfer/gait ability.   Baseline 13. 50 sec   Time 8   Period Weeks   Status New     PT LONG TERM GOAL #5   Title Patient will tolerate 5 seconds of single leg stance without loss of balance to improve ability to get in and out of shower safely.   Baseline unable to stand single leg stand   Period Weeks   Status New               Plan - 03/06/17 1109    Clinical Impression Statement Patient demonstrates wide BOS during ambulation and still relies on vision for increased dynamic stability.  Improvement noted on balance therex with head turns today and patient described less knee and HS discomfrot on SL leg press.  Patient tolerated therapy well today and expressed mild fatigue towards end of session.  Patient will continue to benefit from skilled physical therapy to improve endurance and dynamic balance to decrease fall risk.   Rehab Potential Good   PT Frequency 2x / week   PT Duration 8 weeks   PT Treatment/Interventions Gait training;Functional mobility training;Therapeutic activities;Therapeutic exercise;Balance training;Neuromuscular re-education   PT Next Visit Plan outcome measures, update g codes and goals, balance and strengthening   PT Home Exercise Plan Continue recreational exercise and HEP    Consulted and Agree with Plan of Care Patient      Patient will benefit from skilled therapeutic intervention in order to improve the following deficits and impairments:  Abnormal gait, Decreased balance, Decreased endurance, Decreased mobility, Difficulty walking, Impaired sensation, Cardiopulmonary status limiting activity, Decreased activity tolerance, Decreased safety awareness, Decreased strength  Visit Diagnosis: Muscle weakness (generalized)  Difficulty in walking, not elsewhere classified     Problem List Patient  Active Problem List   Diagnosis Date Noted  . Dehydration 12/28/2016  . Encounter for monitoring cardiotoxic drug therapy 10/22/2016  . Protein-calorie malnutrition, severe 09/18/2016  . 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 Abdominal mass 07/31/2016  . DM 10/20/2008  . HYPERLIPIDEMIA 10/20/2008  . OBSTRUCTIVE SLEEP APNEA 10/20/2008  . HYPERTENSION 10/20/2008  . ALLERGIC RHINITIS 10/20/2008  . PULMONARY  FIBROSIS 10/20/2008   This entire session was performed under direct supervision and direction of a licensed therapist/therapist assistant . I have personally read, edited and approve of the note as written.   Sheliah Plane SPT  Phillips Grout PT, DPT   Huprich,Jason 03/06/2017, 1:34 PM  [place] MAIN [facility] SERVICES [address] [place], [place], [address] Phone: [phone]   Fax:  [phone]  Name: AINSLEY DEAKINS MRN: [id number] Date of Birth: Jan 08, 1946

## 2017-03-07 ENCOUNTER — Ambulatory Visit
Admission: RE | Admit: 2017-03-07 | Discharge: 2017-03-07 | Disposition: A | Discharge status: Home / Self Care | Payer: Medicare Other | Source: Ambulatory Visit | Attending: Radiation Oncology | Admitting: Radiation Oncology

## 2017-03-07 DIAGNOSIS — C8333 Diffuse large B-cell lymphoma, intra-abdominal lymph nodes: Secondary | ICD-10-CM | POA: Diagnosis not present

## 2017-03-08 ENCOUNTER — Ambulatory Visit
Admission: RE | Admit: 2017-03-08 | Discharge: 2017-03-08 | Disposition: A | Discharge status: Home / Self Care | Payer: Medicare Other | Source: Ambulatory Visit | Attending: Radiation Oncology | Admitting: Radiation Oncology

## 2017-03-08 DIAGNOSIS — C8333 Diffuse large B-cell lymphoma, intra-abdominal lymph nodes: Secondary | ICD-10-CM | POA: Diagnosis not present

## 2017-03-11 ENCOUNTER — Ambulatory Visit
Admission: RE | Admit: 2017-03-11 | Discharge: 2017-03-11 | Disposition: A | Discharge status: Home / Self Care | Payer: Medicare Other | Source: Ambulatory Visit | Attending: Radiation Oncology | Admitting: Radiation Oncology

## 2017-03-11 DIAGNOSIS — C8333 Diffuse large B-cell lymphoma, intra-abdominal lymph nodes: Secondary | ICD-10-CM | POA: Diagnosis not present

## 2017-03-12 ENCOUNTER — Ambulatory Visit: Payer: Medicare Other | Admitting: Physical Therapy

## 2017-03-12 ENCOUNTER — Ambulatory Visit
Admission: RE | Admit: 2017-03-12 | Discharge: 2017-03-12 | Disposition: A | Discharge status: Home / Self Care | Payer: Medicare Other | Source: Ambulatory Visit | Attending: Radiation Oncology | Admitting: Radiation Oncology

## 2017-03-12 ENCOUNTER — Encounter: Payer: Self-pay | Admitting: Physical Therapy

## 2017-03-12 DIAGNOSIS — R262 Difficulty in walking, not elsewhere classified: Secondary | ICD-10-CM | POA: Diagnosis not present

## 2017-03-12 DIAGNOSIS — M6281 Muscle weakness (generalized): Secondary | ICD-10-CM

## 2017-03-12 DIAGNOSIS — C8333 Diffuse large B-cell lymphoma, intra-abdominal lymph nodes: Secondary | ICD-10-CM | POA: Diagnosis not present

## 2017-03-12 NOTE — Therapy (Signed)
[place] MAIN [facility] SERVICES [address] [place], [place], [address] Phone: [phone]   Fax:  [phone]  Physical Therapy Treatment/ Progress note  Patient Details  Name: Rose Hill MRN: [id number] Date of Birth: September 02, 1945 Referring Provider: Cammie Sickle  Encounter Date: 03/12/2017      PT End of Session - 03/12/17 1107    Visit Number 10   Number of Visits 17   Date for PT Re-Evaluation 03-29-17   Authorization Type g codes 10/10   Authorization Time Period 7/10   PT Start Time 1100   PT Stop Time 1145   PT Time Calculation (min) 45 min   Equipment Utilized During Treatment Gait belt   Activity Tolerance Patient limited by fatigue   Behavior During Therapy [facility] for tasks assessed/performed      Past Medical History:  Diagnosis Date  . Diabetes mellitus without complication ([place])   . History of gastric ulcer   . Hypercholesterolemia   . Hypertension   . ILD (interstitial lung disease) ([place])    8 yrs ago  . Lymphadenopathy     Past Surgical History:  Procedure Laterality Date  . BREAST BIOPSY Left 2010   neg  . HERNIA REPAIR    . PARTIAL HYSTERECTOMY     age 35  . PERIPHERAL VASCULAR CATHETERIZATION N/A 08/22/2016   Procedure: [name];  Surgeon: [name], MD;  Location: [place] [facility];  Service: Cardiovascular;  Laterality: N/A;  . TONSILLECTOMY      There were no vitals filed for this visit.      Subjective Assessment - 03/12/17 1104    Subjective Patient states being a little dizzy due to small dose of pain medication taken this morning.   Pertinent History Patient was dx Jul 27 2016. She had had chemo therapy and is having radiation February 13, 2017 for 15 treatments. She has had 5 weeks of chemo. She is feeling weak and having difficulty with with sit to stand and walking.    Limitations Standing;Walking   How long can you stand comfortably? unknown   How long can you walk  comfortably? 10 minutes   Patient Stated Goals to be able to walk better and transfer sit to stand better.   Currently in Pain? Yes   Pain Score 3    Pain Location Leg   Pain Orientation Left   Pain Descriptors / Indicators Aching   Pain Type Chronic pain   Pain Onset 1 to 4 weeks ago   Pain Frequency Constant   Multiple Pain Sites No       Nu Step warm up 5 minutes  Outcome measures performed for 10th visit (5 STS, 6 MWT, TUG, SLS) Vitals before 6 MWT: BP= 136/49 HR= 65 SpO2= 98% Vitals after 6 MWT: BP= 133/49 HR= 71 SpO2= 100  Single leg press 45 lbs 2 x 15 each. Patient instructed to push through her heel to minimize knee joint pain.   Single leg stance 1 x 2 minutes each. Frequent LOB bilaterally but able to easily correct with UE. This exercise seems like a good challenge for patient.                            PT Education - 03/12/17 1105    Education provided Yes   Education Details Continue to progress therex to challenge strength and dynamic balance   Person(s) Educated Patient  Methods Explanation   Comprehension Verbalized understanding             PT Long Term Goals - 03/12/17 1109      PT LONG TERM GOAL #1   Title Patient will be independent in home exercise program to improve strength/mobility for better functional independence with ADLs.   Time 8   Period Weeks   Status On-going     PT LONG TERM GOAL #2   Title Patient (> 69 years old) will complete five times sit to stand test in < 15 seconds indicating an increased LE strength and improved balance.   Baseline 20.97 sec 03/12/17: 13.17 seconds   Time 8   Period Weeks   Status Achieved     PT LONG TERM GOAL #3   Title Patient will increase six minute walk test distance to >1000 for progression to community ambulator and improve gait ability   Baseline 850 feet 03/12/17: 1260 ft   Time 8   Period Weeks   Status Achieved     PT LONG TERM GOAL #4   Title Patient will  reduce timed up and go to <11 seconds to reduce fall risk and demonstrate improved transfer/gait ability.   Baseline 13. 50 sec 03/12/17: 10.9 seconds   Time 8   Period Weeks   Status Achieved     PT LONG TERM GOAL #5   Title Patient will tolerate 5 seconds of single leg stance without loss of balance to improve ability to get in and out of shower safely.   Baseline unable to stand single leg stand 03/12/17: R=5 seconds L=7 seconds   Period Weeks   Status Achieved               Plan - 03/12/17 1146    Clinical Impression Statement Patient completed outcome measures after warm up on Nu Step.  Patient completed all outcome measures without restbreaks and achieved all of her goals.  Patient was educated on the progress seen in strength, balance, and endurance in terms of plans to discharge.  Consensus with patient is too schedule for a few more visits to facilitate gains made and ultimately begin gym membership at [facility].    Rehab Potential Good   PT Frequency 2x / week   PT Duration 8 weeks   PT Treatment/Interventions Gait training;Functional mobility training;Therapeutic activities;Therapeutic exercise;Balance training;Neuromuscular re-education   PT Next Visit Plan Show patient gym equipment. Print out HEP    PT Home Exercise Plan Continue recreational exercise and HEP    Consulted and Agree with Plan of Care Patient      Patient will benefit from skilled therapeutic intervention in order to improve the following deficits and impairments:  Abnormal gait, Decreased balance, Decreased endurance, Decreased mobility, Difficulty walking, Impaired sensation, Cardiopulmonary status limiting activity, Decreased activity tolerance, Decreased safety awareness, Decreased strength  Visit Diagnosis: Muscle weakness (generalized)  Difficulty in walking, not elsewhere classified     Problem List Patient Active Problem List   Diagnosis Date Noted  . Dehydration 12/28/2016  . Encounter for  monitoring cardiotoxic drug therapy 10/22/2016  . Protein-calorie malnutrition, severe 09/18/2016  . 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 Abdominal mass 07/31/2016  . DM 10/20/2008  . HYPERLIPIDEMIA 10/20/2008  . OBSTRUCTIVE SLEEP APNEA 10/20/2008  . HYPERTENSION 10/20/2008  . ALLERGIC RHINITIS 10/20/2008  . PULMONARY FIBROSIS 10/20/2008  This entire session was performed under direct supervision and direction of a licensed therapist/therapist assistant .  I have personally read, edited and approve of the note as written. Sheliah Plane SPT  [place], [place], DPT  03/12/2017, 11:55 AM  [place] MAIN [facility] SERVICES [address] [place], [place], [address] Phone: [phone]   Fax:  [phone]  Name: LAYANI FORONDA MRN: [id number] Date of Birth: 10/07/1945

## 2017-03-13 ENCOUNTER — Ambulatory Visit
Admission: RE | Admit: 2017-03-13 | Discharge: 2017-03-13 | Disposition: A | Discharge status: Home / Self Care | Payer: Medicare Other | Source: Ambulatory Visit | Attending: Radiation Oncology | Admitting: Radiation Oncology

## 2017-03-13 ENCOUNTER — Inpatient Hospital Stay: Payer: Medicare Other

## 2017-03-13 DIAGNOSIS — C8333 Diffuse large B-cell lymphoma, intra-abdominal lymph nodes: Secondary | ICD-10-CM

## 2017-03-13 LAB — CBC
HCT: 35.7 % (ref 35.0–47.0)
HEMOGLOBIN: 12.1 g/dL (ref 12.0–16.0)
MCH: 29.9 pg (ref 26.0–34.0)
MCHC: 34 g/dL (ref 32.0–36.0)
MCV: 87.8 fL (ref 80.0–100.0)
PLATELETS: 179 10*3/uL (ref 150–440)
RBC: 4.07 MIL/uL (ref 3.80–5.20)
RDW: 16.1 % — ABNORMAL HIGH (ref 11.5–14.5)
WBC: 4.1 10*3/uL (ref 3.6–11.0)

## 2017-03-14 ENCOUNTER — Ambulatory Visit
Admission: RE | Admit: 2017-03-14 | Discharge: 2017-03-14 | Disposition: A | Discharge status: Home / Self Care | Payer: Medicare Other | Source: Ambulatory Visit | Attending: Radiation Oncology | Admitting: Radiation Oncology

## 2017-03-14 ENCOUNTER — Ambulatory Visit: Payer: Medicare Other | Admitting: Physical Therapy

## 2017-03-14 ENCOUNTER — Other Ambulatory Visit: Payer: Self-pay | Admitting: Internal Medicine

## 2017-03-14 DIAGNOSIS — G620 Drug-induced polyneuropathy: Secondary | ICD-10-CM

## 2017-03-14 DIAGNOSIS — C8333 Diffuse large B-cell lymphoma, intra-abdominal lymph nodes: Secondary | ICD-10-CM

## 2017-03-14 DIAGNOSIS — M21372 Foot drop, left foot: Secondary | ICD-10-CM

## 2017-03-14 DIAGNOSIS — T451X5A Adverse effect of antineoplastic and immunosuppressive drugs, initial encounter: Secondary | ICD-10-CM

## 2017-03-15 ENCOUNTER — Ambulatory Visit
Admission: RE | Admit: 2017-03-15 | Discharge: 2017-03-15 | Disposition: A | Discharge status: Home / Self Care | Payer: Medicare Other | Source: Ambulatory Visit | Attending: Radiation Oncology | Admitting: Radiation Oncology

## 2017-03-15 DIAGNOSIS — C8333 Diffuse large B-cell lymphoma, intra-abdominal lymph nodes: Secondary | ICD-10-CM | POA: Diagnosis not present

## 2017-03-18 ENCOUNTER — Other Ambulatory Visit: Payer: Self-pay

## 2017-03-18 ENCOUNTER — Inpatient Hospital Stay: Payer: Medicare Other

## 2017-03-18 ENCOUNTER — Inpatient Hospital Stay (HOSPITAL_BASED_OUTPATIENT_CLINIC_OR_DEPARTMENT_OTHER): Payer: Medicare Other | Admitting: Internal Medicine

## 2017-03-18 ENCOUNTER — Ambulatory Visit
Admission: RE | Admit: 2017-03-18 | Discharge: 2017-03-18 | Disposition: A | Discharge status: Home / Self Care | Payer: Medicare Other | Source: Ambulatory Visit | Attending: Radiation Oncology | Admitting: Radiation Oncology

## 2017-03-18 VITALS — BP 123/76 | HR 74 | Temp 97.6°F | Resp 20 | Ht 67.0 in | Wt 123.0 lb

## 2017-03-18 DIAGNOSIS — Z95828 Presence of other vascular implants and grafts: Secondary | ICD-10-CM

## 2017-03-18 DIAGNOSIS — E119 Type 2 diabetes mellitus without complications: Secondary | ICD-10-CM

## 2017-03-18 DIAGNOSIS — M21372 Foot drop, left foot: Secondary | ICD-10-CM

## 2017-03-18 DIAGNOSIS — T451X5A Adverse effect of antineoplastic and immunosuppressive drugs, initial encounter: Secondary | ICD-10-CM

## 2017-03-18 DIAGNOSIS — Z87891 Personal history of nicotine dependence: Secondary | ICD-10-CM

## 2017-03-18 DIAGNOSIS — C8333 Diffuse large B-cell lymphoma, intra-abdominal lymph nodes: Secondary | ICD-10-CM | POA: Diagnosis not present

## 2017-03-18 DIAGNOSIS — E7800 Pure hypercholesterolemia, unspecified: Secondary | ICD-10-CM

## 2017-03-18 DIAGNOSIS — R591 Generalized enlarged lymph nodes: Secondary | ICD-10-CM

## 2017-03-18 DIAGNOSIS — E876 Hypokalemia: Secondary | ICD-10-CM

## 2017-03-18 DIAGNOSIS — Z8711 Personal history of peptic ulcer disease: Secondary | ICD-10-CM | POA: Diagnosis not present

## 2017-03-18 DIAGNOSIS — R000 Tachycardia, unspecified: Secondary | ICD-10-CM | POA: Diagnosis not present

## 2017-03-18 DIAGNOSIS — Z79899 Other long term (current) drug therapy: Secondary | ICD-10-CM

## 2017-03-18 DIAGNOSIS — Z923 Personal history of irradiation: Secondary | ICD-10-CM

## 2017-03-18 DIAGNOSIS — Z7984 Long term (current) use of oral hypoglycemic drugs: Secondary | ICD-10-CM

## 2017-03-18 DIAGNOSIS — I10 Essential (primary) hypertension: Secondary | ICD-10-CM

## 2017-03-18 DIAGNOSIS — G629 Polyneuropathy, unspecified: Secondary | ICD-10-CM

## 2017-03-18 DIAGNOSIS — G620 Drug-induced polyneuropathy: Secondary | ICD-10-CM

## 2017-03-18 LAB — CBC WITH DIFFERENTIAL/PLATELET
BASOS PCT: 1 %
Basophils Absolute: 0 10*3/uL (ref 0–0.1)
Eosinophils Absolute: 0.3 10*3/uL (ref 0–0.7)
Eosinophils Relative: 7 %
HEMATOCRIT: 32.7 % — AB (ref 35.0–47.0)
HEMOGLOBIN: 11.2 g/dL — AB (ref 12.0–16.0)
LYMPHS ABS: 0.8 10*3/uL — AB (ref 1.0–3.6)
Lymphocytes Relative: 19 %
MCH: 30.1 pg (ref 26.0–34.0)
MCHC: 34.3 g/dL (ref 32.0–36.0)
MCV: 87.6 fL (ref 80.0–100.0)
MONOS PCT: 16 %
Monocytes Absolute: 0.7 10*3/uL (ref 0.2–0.9)
NEUTROS ABS: 2.6 10*3/uL (ref 1.4–6.5)
NEUTROS PCT: 57 %
Platelets: 171 10*3/uL (ref 150–440)
RBC: 3.73 MIL/uL — AB (ref 3.80–5.20)
RDW: 15.9 % — ABNORMAL HIGH (ref 11.5–14.5)
WBC: 4.4 10*3/uL (ref 3.6–11.0)

## 2017-03-18 LAB — COMPREHENSIVE METABOLIC PANEL
ALBUMIN: 3.8 g/dL (ref 3.5–5.0)
ALK PHOS: 44 U/L (ref 38–126)
ALT: 9 U/L — AB (ref 14–54)
ANION GAP: 7 (ref 5–15)
AST: 16 U/L (ref 15–41)
BUN: 11 mg/dL (ref 6–20)
CALCIUM: 8.8 mg/dL — AB (ref 8.9–10.3)
CO2: 24 mmol/L (ref 22–32)
CREATININE: 0.48 mg/dL (ref 0.44–1.00)
Chloride: 107 mmol/L (ref 101–111)
GFR calc Af Amer: >60 mL/min (ref >60)
GFR calc non Af Amer: >60 mL/min (ref >60)
GLUCOSE: 96 mg/dL (ref 65–99)
Potassium: 4 mmol/L (ref 3.5–5.1)
Sodium: 138 mmol/L (ref 135–145)
TOTAL PROTEIN: 6 g/dL — AB (ref 6.5–8.1)
Total Bilirubin: 0.3 mg/dL (ref 0.3–1.2)

## 2017-03-18 LAB — LACTATE DEHYDROGENASE: LDH: 146 U/L (ref 98–192)

## 2017-03-18 MED ORDER — GABAPENTIN 300 MG PO CAPS: 300.0000 mg | ORAL_CAPSULE | Freq: Two times a day (BID) | ORAL | 4 refills | Status: DC

## 2017-03-18 MED ORDER — METOPROLOL TARTRATE 25 MG PO TABS: 25.0000 mg | ORAL_TABLET | Freq: Two times a day (BID) | ORAL | 4 refills | Status: DC

## 2017-03-18 MED ORDER — SODIUM CHLORIDE 0.9% FLUSH
10.0000 mL | INTRAVENOUS | Status: DC | PRN
  Administered 2017-03-18: 10 mL via INTRAVENOUS
  Filled 2017-03-18: qty 10

## 2017-03-18 MED ORDER — HEPARIN SOD (PORK) LOCK FLUSH 100 UNIT/ML IV SOLN
500.0000 [IU] | Freq: Once | INTRAVENOUS | Status: AC
  Administered 2017-03-18: 500 [IU] via INTRAVENOUS

## 2017-03-18 NOTE — Assessment & Plan Note (Addendum)
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 Currently status post S/p  #5  of DA-EPOCH [s/p #1 cycle of R-CHOP] chemotherapy appx 3 months ago. Restaging PET scan- significant/excellent response to chemotherapy. ~1.2 cm lymph node in the retroperitoneum/SUV 3 [baseline-approximately 10 cm sized mass; with SUV of around 45]. She is currently status post radiation- last treatment today.  # Patient has tolerated consolidation radiation very well. Would recommend a follow-up/PET scan in approximately 4 months.   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 Significant improvement. Continue Neurontin to 300 mg  BID. New prescription given.  #Discussed regarding the port at length- would like to keep it for the next one to 2 years. Recommend port flush every 2 months.   # 4 months/labs/PET scan prior..   # Hypokalemia potassium 4.0 - STOP Kdur; discussed Dietary supplementation.  # Tachycardia- significant improvement on metoprolol. Continue metoprolol 25 twice a day. New prescription given.  # Port flush in 2 months ; follow-up M.D. labs PET scan prior in 4 months.  Cc: Dr.Miller.

## 2017-03-18 NOTE — Progress Notes (Signed)
Nutrition Follow-up:  Spoke with patient at MD clinic visit this pm.    Patient reports appetite has come back and she is eating "everything that is not tied down." Patient reports that she has a biscuit or eggs, bacon, toast for breakfast, sandwich and fruit for lunch and meat and vegetables and fruit for dinner.  Reports that she sometimes still uses shakes (dairy free recipes) at times.      No nutrition issues reported.  Noted patient has been seeing PT.  Medications: reviewed  Labs: reviewed  Anthropometrics:   Weight has increased to 123 lb today, from 120 lb 6.4 oz on 5/31   NUTRITION DIAGNOSIS: Inadequate food and beverage intake improved   MALNUTRITION DIAGNOSIS: severe malnutrition improved   INTERVENTION:   Encouraged patient to continue to eat well-balanced diet to promote continued weight gain and improve overall nutrition.   Encouraged use of non-dairy shakes as well for added nutrition Patient knows to contact me with questions or concerns.      MONITORING, EVALUATION, GOAL: Patient will consume adequate calories and protein to meet nutritional needs and prevent further weight loss.   NEXT VISIT: patient to contact as needed  Alany Borman B. [name], [place], [place] Registered Dietitian [phone] (pager)

## 2017-03-18 NOTE — Progress Notes (Signed)
Patient here for lymphoma follow-up. Patient reports improvement in neuropathy & lower extremity weakness s/p PT.

## 2017-03-18 NOTE — Progress Notes (Signed)
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  female with above history of diffuse large B-cell lymphoma [triple hit] [facility] For follow-up. Patient received cycle #6 approximately 3 months.  Patient is currently finishing up radiation. Today's the last day.  Patient has tolerated radiation very well. Denies any unusual diarrhea and nausea vomiting.  Her neuropathy has significantly improved; since she did physical therapy. She is able to walk by herself. Intermittent tingling and numbness. She is on Neurontin.  Overall she feels much improved since the last visit 2 months ago.   ROS: A complete 10 point review of system is done which is negative except mentioned above in history of present illness  MEDICAL HISTORY:  Past Medical History:  Diagnosis Date  . Diabetes mellitus without complication ([place])   . History of gastric ulcer   . Hypercholesterolemia   . Hypertension   . ILD (interstitial lung disease) ([place])    8 yrs ago  .  Lymphadenopathy     SURGICAL HISTORY: Past Surgical History:  Procedure Laterality Date  . BREAST BIOPSY Left 2010   neg  . HERNIA REPAIR    . PARTIAL HYSTERECTOMY     age 89  . PERIPHERAL VASCULAR CATHETERIZATION N/A 08/22/2016   Procedure: [name];  Surgeon: [name], MD;  Location: [facility];  Service: Cardiovascular;  Laterality: N/A;  . TONSILLECTOMY      SOCIAL HISTORY: quit smoking 40 years ago; human resources retd; in [place]; no alcohol Social History   Social History  . Marital status: Married    Spouse name: Aniya Jolicoeur  . Number of children: 2  . Years of education: N/A   Occupational History  . Retired    Social History Main Topics  . Smoking status: Former Smoker    Quit date: 08/21/1958  . Smokeless tobacco: Never Used  . Alcohol use No  . Drug use: No  . Sexual activity: No   Other Topics Concern  . Not on file   Social History Narrative   Not applicable at this time    FAMILY HISTORY: Family History  Problem Relation Age of Onset  . Heart disease Mother   . Heart disease Father   . Heart disease Brother     ALLERGIES:  is allergic to levaquin [levofloxacin in d5w]; cefuroxime axetil; and doxycycline.  MEDICATIONS:  Current Outpatient Prescriptions  Medication Sig Dispense Refill  . b complex vitamins tablet Take  1 tablet by mouth daily.    Marland Kitchen gabapentin (NEURONTIN) 300 MG capsule Take 1 capsule (300 mg total) by mouth 2 (two) times daily. 180 capsule 4  . latanoprost (XALATAN) 0.005 % ophthalmic solution Place 1 drop into both eyes at bedtime.     . Magnesium 400 MG TABS Take 1 tablet by mouth daily.    . metFORMIN (GLUCOPHAGE) 500 MG tablet Take 1 tablet by mouth daily with breakfast.     . metoprolol tartrate (LOPRESSOR) 25 MG tablet Take 1 tablet (25 mg total) by mouth 2 (two) times daily. 180 tablet 4  . ondansetron (ZOFRAN) 8 MG tablet TAKE 1 TABLET BY MOUTH EVERY 8 HOURS AS NEEDED FOR NAUSEA AND  VOMITING (TAKE TWICE DAILY FOR THE FIRST 3 DAYS AFTER CHEMOTHERAPY) 30 tablet 0  . pantoprazole (PROTONIX) 40 MG tablet Take 1 tablet by mouth 2 (two) times daily.    . potassium chloride SA (K-DUR,KLOR-CON) 20 MEQ tablet 1 pill twice a day 40 tablet 3  . pravastatin (PRAVACHOL) 40 MG tablet Take 1 tablet by mouth daily.    Marland Kitchen albuterol (PROVENTIL HFA;VENTOLIN HFA) 108 (90 Base) MCG/ACT inhaler Inhale 2 puffs into the lungs every 6 (six) hours as needed for wheezing or shortness of breath. (Patient not taking: Reported on 03/18/2017) 1 Inhaler 2  . lidocaine-prilocaine (EMLA) cream Apply cream 1 hour before chemotherapy treatment, place small amount of saran wrap over cream to protect clothing. (Patient not taking: Reported on 03/18/2017) 30 g 1  . losartan-hydrochlorothiazide (HYZAAR) 100-12.5 MG tablet Take 1 tablet by mouth daily.    . magic mouthwash w/lidocaine SOLN Take 5 mLs by mouth 4 (four) times daily. 80 ml viscous lidocaine 2%, 80 ml Mylanta, 80 ml Diphenhydramine 12.5 mg/5 ml Elixir, 80 ml Nystatin 100,000 Unit suspension, 80 ml Prednisolone 15 mg/19ml, 80 ml Distilled Water. Sig: Swish/Swallow 5-10 ml four times a day as needed. Dispense 480 ml. 3RFs (Patient not taking: Reported on 01/17/2017) 480 mL 3  . prochlorperazine (COMPAZINE) 10 MG tablet Take 1 tablet (10 mg total) by mouth every 6 (six) hours as needed for nausea, vomiting or refractory nausea / vomiting. (Patient not taking: Reported on 01/17/2017) 30 tablet 0   No current facility-administered medications for this visit.    Facility-Administered Medications Ordered in Other Visits  Medication Dose Route Frequency Provider Last Rate Last Dose  . 0.9 %  sodium chloride infusion   Intravenous Continuous Cammie Sickle, MD   Stopped at 11/05/16 1217  . 0.9 %  sodium chloride infusion   Intravenous Continuous Cammie Sickle, MD   Stopped at 12/03/16 1345  . 0.9 %  sodium chloride infusion   Intravenous Continuous  Cammie Sickle, MD 10 mL/hr at 12/05/16 1030    . 0.9 %  sodium chloride infusion   Intravenous Continuous Cammie Sickle, MD   Stopped at 12/24/16 1256  . 0.9 %  sodium chloride infusion   Intravenous Continuous Cammie Sickle, MD   Stopped at 12/26/16 1011  . 0.9 %  sodium chloride infusion   Intravenous Continuous Charlaine Dalton R, MD 10 mL/hr at 12/27/16 0930    . heparin lock flush 100 unit/mL  500 Units Intravenous Once Charlaine Dalton R, MD      . heparin lock flush 100 unit/mL  500 Units Intracatheter Once PRN Charlaine Dalton R, MD      . heparin lock flush 100 unit/mL  500 Units Intravenous Once Cammie Sickle, MD      .  sodium chloride flush (NS) 0.9 % injection 10 mL  10 mL Intravenous PRN Cammie Sickle, MD   10 mL at 11/06/16 1000  . sodium chloride flush (NS) 0.9 % injection 10 mL  10 mL Intravenous PRN Cammie Sickle, MD   10 mL at 11/07/16 0905  . sodium chloride flush (NS) 0.9 % injection 10 mL  10 mL Intravenous PRN Charlaine Dalton R, MD      . sodium chloride flush (NS) 0.9 % injection 10 mL  10 mL Intracatheter PRN Charlaine Dalton R, MD      . sodium chloride flush (NS) 0.9 % injection 10 mL  10 mL Intravenous PRN Cammie Sickle, MD   10 mL at 12/05/16 1030  . sodium chloride flush (NS) 0.9 % injection 10 mL  10 mL Intravenous PRN Cammie Sickle, MD   10 mL at 12/27/16 0930  . sodium chloride flush (NS) 0.9 % injection 10 mL  10 mL Intravenous PRN Cammie Sickle, MD   10 mL at 03/18/17 1450      .  PHYSICAL EXAMINATION: ECOG PERFORMANCE STATUS: 1 - Symptomatic but completely ambulatory  Vitals:   03/18/17 1500  BP: 123/76  Pulse: 74  Resp: 20  Temp: 97.6 F (36.4 C)   Filed Weights   03/18/17 1515  Weight: 123 lb (55.8 kg)    GENERAL: Well-nourished well-developed; Alert, no distress and comfortable.   With her husband. EYES: no pallor or icterus;  OROPHARYNX: no thrush or  ulceration; good dentition  NECK: supple, no masses felt LYMPH:  no palpable lymphadenopathy in the cervical, axillary or inguinal regions LUNGS: Positive for crackles at the bases auscultation and  No wheeze.  HEART/CVS: regular rate & rhythm and no murmurs; No lower extremity edema ABDOMEN: abdomen soft, no tenderness; normal bowel sounds.  PSYCH: alert & oriented x 3 with fluent speech NEURO: no focal motor/sensory deficits. Resolved. SKIN:  no rashes or significant lesions  LABORATORY DATA:  I have reviewed the data as listed Lab Results  Component Value Date   WBC 4.4 03/18/2017   HGB 11.2 (L) 03/18/2017   HCT 32.7 (L) 03/18/2017   MCV 87.6 03/18/2017   PLT 171 03/18/2017    Recent Labs  12/03/16 0903 12/24/16 0820 03/18/17 1422  NA 138 140 138  K 3.3* 3.0* 4.0  CL 102 104 107  CO2 27 29 24   GLUCOSE 162* 145* 96  BUN 8 7 11   CREATININE 0.34* 0.32* 0.48  CALCIUM 9.2 8.9 8.8*  GFRNONAA >60 >60 >60  GFRAA >60 >60 >60  PROT 6.6 6.5 6.0*  ALBUMIN 3.6 3.3* 3.8  AST 16 14* 16  ALT 9* 8* 9*  ALKPHOS 57 63 44  BILITOT 0.3 0.5 0.3    RADIOGRAPHIC STUDIES: I have personally reviewed the radiological images as listed and agreed with the findings in the report. No results found.  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 Currently status post S/p  #5  of DA-EPOCH [s/p #1 cycle of R-CHOP] chemotherapy appx 3 months ago. Restaging PET scan- significant/excellent response to chemotherapy. ~1.2 cm lymph node in the retroperitoneum/SUV 3 [baseline-approximately 10 cm sized mass; with SUV of around 45]. She is currently status post radiation- last treatment today.  # Patient has tolerated consolidation radiation very well. Would recommend a follow-up/PET scan in approximately 4 months.   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 Significant improvement. Continue Neurontin to 300 mg  BID. New prescription given.  #Discussed regarding the port at length- would like to keep it for the next one to 2 years. Recommend port flush every 2 months.   # 4 months/labs/PET scan prior..   # Hypokalemia potassium 4.0 - STOP Kdur; discussed Dietary supplementation.  # Tachycardia- significant improvement on metoprolol. Continue metoprolol 25 twice a day. New prescription given.  # Port flush in 2 months ; follow-up M.D. labs PET scan prior in 4 months.  Cc: Dr.Miller.   All questions were answered. The patient knows to call the clinic with any problems, questions or concerns.    Cammie Sickle, MD 03/18/2017 4:50 PM

## 2017-03-19 ENCOUNTER — Ambulatory Visit: Payer: Medicare Other | Admitting: Physical Therapy

## 2017-03-21 ENCOUNTER — Ambulatory Visit: Payer: Medicare Other | Admitting: Physical Therapy

## 2017-04-09 ENCOUNTER — Other Ambulatory Visit: Payer: Self-pay | Admitting: Internal Medicine

## 2017-04-09 ENCOUNTER — Telehealth: Payer: Self-pay | Admitting: *Deleted

## 2017-04-09 DIAGNOSIS — C8333 Diffuse large B-cell lymphoma, intra-abdominal lymph nodes: Secondary | ICD-10-CM

## 2017-04-09 NOTE — Telephone Encounter (Signed)
Referral has been placed. 

## 2017-04-09 NOTE — Telephone Encounter (Signed)
Patient called asking for an order to be sent for the [facility] Program for herself.

## 2017-04-24 ENCOUNTER — Ambulatory Visit: Payer: Medicare Other | Admitting: Radiation Oncology

## 2017-05-10 ENCOUNTER — Encounter: Payer: Self-pay | Admitting: Radiation Oncology

## 2017-05-10 ENCOUNTER — Ambulatory Visit
Admission: RE | Admit: 2017-05-10 | Discharge: 2017-05-10 | Disposition: A | Discharge status: Home / Self Care | Payer: Medicare Other | Source: Ambulatory Visit | Attending: Radiation Oncology | Admitting: Radiation Oncology

## 2017-05-10 VITALS — BP 122/59 | HR 63 | Resp 20 | Wt 128.1 lb

## 2017-05-10 DIAGNOSIS — C8333 Diffuse large B-cell lymphoma, intra-abdominal lymph nodes: Secondary | ICD-10-CM | POA: Diagnosis not present

## 2017-05-10 DIAGNOSIS — Z923 Personal history of irradiation: Secondary | ICD-10-CM | POA: Diagnosis not present

## 2017-05-10 DIAGNOSIS — Z9221 Personal history of antineoplastic chemotherapy: Secondary | ICD-10-CM | POA: Diagnosis not present

## 2017-05-10 NOTE — Progress Notes (Signed)
Radiation Oncology Follow up Note  Name: [place]   Date:   05/10/2017 MRN:  [id number] DOB: 03-06-1946    This 71 y.o. female presents to the clinic today for one-month follow-up status post involved field radiation therapy for diabetes B-cell lymphoma of the retroperitoneum status post chemotherapy.  REFERRING PROVIDER: Rusty Aus, MD  HPI: Patient is a 71 year old female now 1 month out having received involved field radiation therapy to her retroperitoneum for diffuse B-cell lymphoma. Patient had received R CHOP chemotherapy followed by.DA-R EPOCH. She is currently doing well although she has developed some urinary incontinence of unknown etiology. She is scheduled for a PET scan in approximately 3 months. She specifically denies diarrhea or any other abdominal complaints.  COMPLICATIONS OF TREATMENT: none  FOLLOW UP COMPLIANCE: keeps appointments   PHYSICAL EXAM:  BP (!) 122/59   Pulse 63   Resp 20   Wt 128 lb 1.4 oz (58.1 kg)   BMI 20.06 kg/m  Well-developed well-nourished patient in NAD. HEENT reveals PERLA, EOMI, discs not visualized.  Oral cavity is clear. No oral mucosal lesions are identified. Neck is clear without evidence of cervical or supraclavicular adenopathy. Lungs are clear to A&P. Cardiac examination is essentially unremarkable with regular rate and rhythm without murmur rub or thrill. Abdomen is benign with no organomegaly or masses noted. Motor sensory and DTR levels are equal and symmetric in the upper and lower extremities. Cranial nerves II through XII are grossly intact. Proprioception is intact. No peripheral adenopathy or edema is identified. No motor or sensory levels are noted. Crude visual fields are within normal range.  RADIOLOGY RESULTS: No current films for review  PLAN: Present time she is recovering nicely from her radiation therapy and chemotherapy. I'm please were overall progress. I am somewhat concerned about a urinary incontinence she may  need a referral to urology should that persist I've assured her that is not associated with her radiation. Otherwise I have asked to see her back in 4-5 months for follow-up. She continues close follow-up care with medical oncology.  I would like to take this opportunity to thank you for allowing me to participate in the care of your patient.Armstead Peaks., MD

## 2017-05-13 ENCOUNTER — Inpatient Hospital Stay: Payer: Medicare Other | Attending: Internal Medicine

## 2017-05-13 DIAGNOSIS — G629 Polyneuropathy, unspecified: Secondary | ICD-10-CM | POA: Diagnosis not present

## 2017-05-13 DIAGNOSIS — Z79899 Other long term (current) drug therapy: Secondary | ICD-10-CM | POA: Diagnosis not present

## 2017-05-13 DIAGNOSIS — Z7984 Long term (current) use of oral hypoglycemic drugs: Secondary | ICD-10-CM | POA: Diagnosis not present

## 2017-05-13 DIAGNOSIS — Z95828 Presence of other vascular implants and grafts: Secondary | ICD-10-CM

## 2017-05-13 DIAGNOSIS — Z87891 Personal history of nicotine dependence: Secondary | ICD-10-CM | POA: Diagnosis not present

## 2017-05-13 DIAGNOSIS — E7800 Pure hypercholesterolemia, unspecified: Secondary | ICD-10-CM | POA: Diagnosis not present

## 2017-05-13 DIAGNOSIS — C8333 Diffuse large B-cell lymphoma, intra-abdominal lymph nodes: Secondary | ICD-10-CM | POA: Diagnosis present

## 2017-05-13 DIAGNOSIS — R599 Enlarged lymph nodes, unspecified: Secondary | ICD-10-CM | POA: Diagnosis not present

## 2017-05-13 DIAGNOSIS — I10 Essential (primary) hypertension: Secondary | ICD-10-CM | POA: Diagnosis not present

## 2017-05-13 DIAGNOSIS — E119 Type 2 diabetes mellitus without complications: Secondary | ICD-10-CM | POA: Diagnosis not present

## 2017-05-13 MED ORDER — SODIUM CHLORIDE 0.9% FLUSH
10.0000 mL | INTRAVENOUS | Status: AC | PRN
  Administered 2017-05-13: 10 mL
  Filled 2017-05-13: qty 10

## 2017-05-13 MED ORDER — HEPARIN SOD (PORK) LOCK FLUSH 100 UNIT/ML IV SOLN
500.0000 [IU] | INTRAVENOUS | Status: AC | PRN
  Administered 2017-05-13: 500 [IU]

## 2017-07-08 ENCOUNTER — Encounter
Admission: RE | Admit: 2017-07-08 | Discharge: 2017-07-08 | Disposition: A | Discharge status: Home / Self Care | Payer: Medicare Other | Source: Ambulatory Visit | Attending: Internal Medicine | Admitting: Internal Medicine

## 2017-07-08 DIAGNOSIS — C8333 Diffuse large B-cell lymphoma, intra-abdominal lymph nodes: Secondary | ICD-10-CM | POA: Diagnosis present

## 2017-07-08 DIAGNOSIS — Z9221 Personal history of antineoplastic chemotherapy: Secondary | ICD-10-CM

## 2017-07-08 DIAGNOSIS — Z923 Personal history of irradiation: Secondary | ICD-10-CM

## 2017-07-08 HISTORY — DX: Personal history of antineoplastic chemotherapy: Z92.21

## 2017-07-08 HISTORY — DX: Personal history of irradiation: Z92.3

## 2017-07-08 LAB — GLUCOSE, CAPILLARY: Glucose-Capillary: 105 mg/dL — ABNORMAL HIGH (ref 65–99)

## 2017-07-08 MED ORDER — FLUDEOXYGLUCOSE F - 18 (FDG) INJECTION
12.8700 | Freq: Once | INTRAVENOUS | Status: AC | PRN
  Administered 2017-07-08: 12.87 via INTRAVENOUS

## 2017-07-10 ENCOUNTER — Inpatient Hospital Stay: Payer: Medicare Other | Attending: Internal Medicine

## 2017-07-10 ENCOUNTER — Inpatient Hospital Stay (HOSPITAL_BASED_OUTPATIENT_CLINIC_OR_DEPARTMENT_OTHER): Payer: Medicare Other | Admitting: Internal Medicine

## 2017-07-10 VITALS — BP 117/73 | HR 88 | Temp 98.1°F | Resp 16 | Wt 137.8 lb

## 2017-07-10 DIAGNOSIS — R000 Tachycardia, unspecified: Secondary | ICD-10-CM | POA: Diagnosis not present

## 2017-07-10 DIAGNOSIS — J849 Interstitial pulmonary disease, unspecified: Secondary | ICD-10-CM | POA: Diagnosis not present

## 2017-07-10 DIAGNOSIS — E119 Type 2 diabetes mellitus without complications: Secondary | ICD-10-CM | POA: Diagnosis not present

## 2017-07-10 DIAGNOSIS — R590 Localized enlarged lymph nodes: Secondary | ICD-10-CM

## 2017-07-10 DIAGNOSIS — C8333 Diffuse large B-cell lymphoma, intra-abdominal lymph nodes: Secondary | ICD-10-CM

## 2017-07-10 DIAGNOSIS — Z87891 Personal history of nicotine dependence: Secondary | ICD-10-CM | POA: Diagnosis not present

## 2017-07-10 DIAGNOSIS — Z7984 Long term (current) use of oral hypoglycemic drugs: Secondary | ICD-10-CM | POA: Insufficient documentation

## 2017-07-10 DIAGNOSIS — I10 Essential (primary) hypertension: Secondary | ICD-10-CM

## 2017-07-10 DIAGNOSIS — Z79899 Other long term (current) drug therapy: Secondary | ICD-10-CM | POA: Diagnosis not present

## 2017-07-10 DIAGNOSIS — E7800 Pure hypercholesterolemia, unspecified: Secondary | ICD-10-CM

## 2017-07-10 DIAGNOSIS — Z95828 Presence of other vascular implants and grafts: Secondary | ICD-10-CM

## 2017-07-10 LAB — CBC WITH DIFFERENTIAL/PLATELET
BASOS ABS: 0 10*3/uL (ref 0–0.1)
BASOS PCT: 1 %
EOS ABS: 0.4 10*3/uL (ref 0–0.7)
EOS PCT: 9 %
HCT: 35.4 % (ref 35.0–47.0)
Hemoglobin: 12 g/dL (ref 12.0–16.0)
Lymphocytes Relative: 19 %
Lymphs Abs: 0.8 10*3/uL — ABNORMAL LOW (ref 1.0–3.6)
MCH: 30.9 pg (ref 26.0–34.0)
MCHC: 33.8 g/dL (ref 32.0–36.0)
MCV: 91.3 fL (ref 80.0–100.0)
MONO ABS: 0.6 10*3/uL (ref 0.2–0.9)
Monocytes Relative: 14 %
NEUTROS ABS: 2.5 10*3/uL (ref 1.4–6.5)
Neutrophils Relative %: 57 %
Platelets: 177 10*3/uL (ref 150–440)
RBC: 3.88 MIL/uL (ref 3.80–5.20)
RDW: 14.6 % — ABNORMAL HIGH (ref 11.5–14.5)
WBC: 4.4 10*3/uL (ref 3.6–11.0)

## 2017-07-10 LAB — COMPREHENSIVE METABOLIC PANEL
ALBUMIN: 3.7 g/dL (ref 3.5–5.0)
ALK PHOS: 54 U/L (ref 38–126)
ALT: 12 U/L — AB (ref 14–54)
ANION GAP: 8 (ref 5–15)
AST: 21 U/L (ref 15–41)
BILIRUBIN TOTAL: 0.4 mg/dL (ref 0.3–1.2)
BUN: 19 mg/dL (ref 6–20)
CALCIUM: 8.9 mg/dL (ref 8.9–10.3)
CO2: 26 mmol/L (ref 22–32)
CREATININE: 0.67 mg/dL (ref 0.44–1.00)
Chloride: 104 mmol/L (ref 101–111)
GFR calc Af Amer: >60 mL/min (ref >60)
GFR calc non Af Amer: >60 mL/min (ref >60)
GLUCOSE: 100 mg/dL — AB (ref 65–99)
Potassium: 4 mmol/L (ref 3.5–5.1)
Sodium: 138 mmol/L (ref 135–145)
TOTAL PROTEIN: 6.5 g/dL (ref 6.5–8.1)

## 2017-07-10 LAB — LACTATE DEHYDROGENASE: LDH: 146 U/L (ref 98–192)

## 2017-07-10 MED ORDER — SODIUM CHLORIDE 0.9% FLUSH
10.0000 mL | INTRAVENOUS | Status: AC | PRN
  Administered 2017-07-10: 10 mL
  Filled 2017-07-10: qty 10

## 2017-07-10 MED ORDER — HEPARIN SOD (PORK) LOCK FLUSH 100 UNIT/ML IV SOLN
500.0000 [IU] | INTRAVENOUS | Status: AC | PRN
  Administered 2017-07-10: 500 [IU]

## 2017-07-10 NOTE — Assessment & Plan Note (Addendum)
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  November 2018 PET scan- significant/excellent response to chemotherapy. ~1.2 cm lymph node in the retroperitoneum/SUV 2 [baseline-approximately 10 cm sized mass; with SUV of around 45].   #Discussed given the high risk disease; I would recommend close surveillance especially since low-grade SUV uptake is noted in the retroperitoneum.  Also educated the patient regarding the potential signs and symptoms of recurrence.   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 Significant improvement.  Of Neurontin.  #Discussed regarding the port at length- would like to keep it for the next one to 2 years. Recommend port flush every 2 months.   # Tachycardia- significant improvement on metoprolol. Continue metoprolol 25 twice a day. New prescription given.  # port flush in 2 months; PET scan in 3 months/MD labs/port flush.  # I reviewed the blood work- with the patient in detail; also reviewed the imaging independently [as summarized above]; and with the patient in detail.    Cc: Dr.Miller; [place].

## 2017-07-10 NOTE — Progress Notes (Signed)
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  female with above history of diffuse large B-cell lymphoma [triple hit] currently on DA- [facility] s/p 6 cycles; followed by consolidation radiation finished in July 2018 is here to review the results of the PET scan.  Her neuropathy has significantly improved; since she did physical therapy. She is able to walk by herself. Intermittent tingling and numbness.  Is gaining weight.  Appetite is improving.  No abdominal discomfort.  No back pain.   ROS: A complete 10 point review of system is done which is negative except mentioned above in history of present illness  MEDICAL HISTORY:  Past Medical History:  Diagnosis Date  . Diabetes mellitus without complication ([place])   . History of chemotherapy 07/08/2017  . History of gastric ulcer   . History of radiation therapy 07/08/2017  . Hypercholesterolemia   . Hypertension   . ILD (interstitial lung disease) ([place])    8 yrs ago  . Lymphadenopathy     SURGICAL HISTORY: Past Surgical History:   Procedure Laterality Date  . BREAST BIOPSY Left 2010   neg  . HERNIA REPAIR    . PARTIAL HYSTERECTOMY     age 42  . PERIPHERAL VASCULAR CATHETERIZATION N/A 08/22/2016   Procedure: [name];  Surgeon: [name], MD;  Location: [facility];  Service: Cardiovascular;  Laterality: N/A;  . TONSILLECTOMY      SOCIAL HISTORY: quit smoking 40 years ago; human resources retd; in [place]; no alcohol Social History   Socioeconomic History  . Marital status: Married    Spouse name: [name]  . Number of children: 2  . Years of education: Not on file  . Highest education level: Not on file  Social Needs  . Financial resource strain: Not on file  . Food insecurity - worry: Not on file  . Food insecurity - inability: Not on file  . Transportation needs - medical: Not on file  . Transportation needs - non-medical: Not on file  Occupational History  . Occupation: Retired  Tobacco Use  . Smoking status: Former Smoker    Last attempt to quit: 08/21/1958    Years since quitting: 58.9  . Smokeless tobacco: Never Used  Substance and Sexual Activity  . Alcohol use: No  . Drug use: No  . Sexual activity: No  Other Topics Concern  . Not on file  Social History Narrative   Not applicable at this time    FAMILY HISTORY: Family  History  Problem Relation Age of Onset  . Heart disease Mother   . Heart disease Father   . Heart disease Brother     ALLERGIES:  is allergic to levaquin [levofloxacin in d5w]; cefuroxime axetil; and doxycycline.  MEDICATIONS:  Current Outpatient Medications  Medication Sig Dispense Refill  . albuterol (PROVENTIL HFA;VENTOLIN HFA) 108 (90 Base) MCG/ACT inhaler Inhale 2 puffs into the lungs every 6 (six) hours as needed for wheezing or shortness of breath. 1 Inhaler 2  . b complex vitamins tablet Take 1 tablet by mouth daily.    Marland Kitchen gabapentin (NEURONTIN) 300 MG capsule Take 1 capsule (300 mg total) by mouth 2 (two) times daily. 180  capsule 4  . latanoprost (XALATAN) 0.005 % ophthalmic solution Place 1 drop into both eyes at bedtime.     . lidocaine-prilocaine (EMLA) cream Apply cream 1 hour before chemotherapy treatment, place small amount of saran wrap over cream to protect clothing. 30 g 1  . losartan-hydrochlorothiazide (HYZAAR) 100-12.5 MG tablet Take 1 tablet by mouth daily.    . magic mouthwash w/lidocaine SOLN Take 5 mLs by mouth 4 (four) times daily. 80 ml viscous lidocaine 2%, 80 ml Mylanta, 80 ml Diphenhydramine 12.5 mg/5 ml Elixir, 80 ml Nystatin 100,000 Unit suspension, 80 ml Prednisolone 15 mg/64ml, 80 ml Distilled Water. Sig: Swish/Swallow 5-10 ml four times a day as needed. Dispense 480 ml. 3RFs 480 mL 3  . Magnesium 400 MG TABS Take 1 tablet by mouth daily.    . metFORMIN (GLUCOPHAGE) 500 MG tablet Take 1 tablet by mouth daily with breakfast.     . metoprolol tartrate (LOPRESSOR) 25 MG tablet Take 1 tablet (25 mg total) by mouth 2 (two) times daily. 180 tablet 4  . ondansetron (ZOFRAN) 8 MG tablet TAKE 1 TABLET BY MOUTH EVERY 8 HOURS AS NEEDED FOR NAUSEA AND VOMITING (TAKE TWICE DAILY FOR THE FIRST 3 DAYS AFTER CHEMOTHERAPY) 30 tablet 0  . pantoprazole (PROTONIX) 40 MG tablet Take 1 tablet by mouth 2 (two) times daily.    . potassium chloride SA (K-DUR,KLOR-CON) 20 MEQ tablet 1 pill twice a day 40 tablet 3  . pravastatin (PRAVACHOL) 40 MG tablet Take 1 tablet by mouth daily.    . prochlorperazine (COMPAZINE) 10 MG tablet Take 1 tablet (10 mg total) by mouth every 6 (six) hours as needed for nausea, vomiting or refractory nausea / vomiting. 30 tablet 0   No current facility-administered medications for this visit.    Facility-Administered Medications Ordered in Other Visits  Medication Dose Route Frequency Provider Last Rate Last Dose  . 0.9 %  sodium chloride infusion   Intravenous Continuous Cammie Sickle, MD   Stopped at 11/05/16 1217  . 0.9 %  sodium chloride infusion   Intravenous Continuous  Cammie Sickle, MD   Stopped at 12/03/16 1345  . 0.9 %  sodium chloride infusion   Intravenous Continuous Cammie Sickle, MD 10 mL/hr at 12/05/16 1030    . 0.9 %  sodium chloride infusion   Intravenous Continuous Cammie Sickle, MD   Stopped at 12/24/16 1256  . 0.9 %  sodium chloride infusion   Intravenous Continuous Cammie Sickle, MD   Stopped at 12/26/16 1011  . 0.9 %  sodium chloride infusion   Intravenous Continuous Charlaine Dalton R, MD 10 mL/hr at 12/27/16 0930    . heparin lock flush 100 unit/mL  500 Units Intravenous Once Cammie Sickle, MD      . heparin  lock flush 100 unit/mL  500 Units Intracatheter Once PRN Charlaine Dalton R, MD      . heparin lock flush 100 unit/mL  500 Units Intravenous Once Charlaine Dalton R, MD      . sodium chloride flush (NS) 0.9 % injection 10 mL  10 mL Intravenous PRN Cammie Sickle, MD   10 mL at 11/06/16 1000  . sodium chloride flush (NS) 0.9 % injection 10 mL  10 mL Intravenous PRN Cammie Sickle, MD   10 mL at 11/07/16 0905  . sodium chloride flush (NS) 0.9 % injection 10 mL  10 mL Intravenous PRN Charlaine Dalton R, MD      . sodium chloride flush (NS) 0.9 % injection 10 mL  10 mL Intracatheter PRN Charlaine Dalton R, MD      . sodium chloride flush (NS) 0.9 % injection 10 mL  10 mL Intravenous PRN Cammie Sickle, MD   10 mL at 12/05/16 1030  . sodium chloride flush (NS) 0.9 % injection 10 mL  10 mL Intravenous PRN Cammie Sickle, MD   10 mL at 12/27/16 0930      .  PHYSICAL EXAMINATION: ECOG PERFORMANCE STATUS: 1 - Symptomatic but completely ambulatory  Vitals:   07/10/17 1155  BP: 117/73  Pulse: 88  Resp: 16  Temp: 98.1 F (36.7 C)   Filed Weights   07/10/17 1155  Weight: 137 lb 12.8 oz (62.5 kg)    GENERAL: Well-nourished well-developed; Alert, no distress and comfortable.   With her husband. EYES: no pallor or icterus;  OROPHARYNX: no thrush or  ulceration; good dentition  NECK: supple, no masses felt LYMPH:  no palpable lymphadenopathy in the cervical, axillary or inguinal regions LUNGS: Positive for crackles at the bases auscultation and  No wheeze.  HEART/CVS: regular rate & rhythm and no murmurs; No lower extremity edema ABDOMEN: abdomen soft, no tenderness; normal bowel sounds.  PSYCH: alert & oriented x 3 with fluent speech NEURO: no focal motor/sensory deficits. Resolved. SKIN:  no rashes or significant lesions  LABORATORY DATA:  I have reviewed the data as listed Lab Results  Component Value Date   WBC 4.4 07/10/2017   HGB 12.0 07/10/2017   HCT 35.4 07/10/2017   MCV 91.3 07/10/2017   PLT 177 07/10/2017   Recent Labs    12/24/16 0820 03/18/17 1422 07/10/17 1101  NA 140 138 138  K 3.0* 4.0 4.0  CL 104 107 104  CO2 29 24 26   GLUCOSE 145* 96 100*  BUN 7 11 19   CREATININE 0.32* 0.48 0.67  CALCIUM 8.9 8.8* 8.9  GFRNONAA >60 >60 >60  GFRAA >60 >60 >60  PROT 6.5 6.0* 6.5  ALBUMIN 3.3* 3.8 3.7  AST 14* 16 21  ALT 8* 9* 12*  ALKPHOS 63 44 54  BILITOT 0.5 0.3 0.4    RADIOGRAPHIC STUDIES: I have personally reviewed the radiological images as listed and agreed with the findings in the report. Nm Pet Image Restag (ps) Skull Base To Thigh  Result Date: 07/08/2017 CLINICAL DATA:  Subsequent treatment strategy for diffuse large B-cell lymphoma of intraabdominal lymph nodes. Pulmonary fibrosis. EXAM: NUCLEAR MEDICINE PET SKULL BASE TO THIGH TECHNIQUE: 12.9 mCi F-18 FDG was injected intravenously. Full-ring PET imaging was performed from the skull base to thigh after the radiotracer. CT data was obtained and used for attenuation correction and anatomic localization. FASTING BLOOD GLUCOSE:  Value: 105 mg/dl COMPARISON:  01/15/2017 FINDINGS: NECK:  No hypermetabolic lymph nodes or  masses. CHEST: No hypermetabolic masses or lymphadenopathy. No suspicious pulmonary nodules seen on CT images. Chronic pulmonary interstitial  fibrosis again demonstrated. ABDOMEN/PELVIS: No abnormal hypermetabolic activity within the liver, pancreas, adrenal glands, or spleen. Mild left paraaortic lymphadenopathy shows no significant change, with index lymph node measuring 12 mm on image 140/3. This shows low-grade FDG uptake with SUV max of 2.2, compared to 3.0 previously. No new or increased hypermetabolic lymphadenopathy identified. Stable moderate hiatal hernia. Tiny nonobstructing left renal calculus is stable. Aortic atherosclerosis. Sigmoid diverticulosis, without evidence of diverticulitis. Prior hysterectomy noted. SKELETON: No focal hypermetabolic bone lesions to suggest skeletal metastasis. IMPRESSION: Mild abdominal retroperitoneal lymphadenopathy remains stable in size, but shows mildly decreased FDG uptake (Deauville score 2 - uptake less than mediastinum). No evidence of new or progressive disease. Stable incidental findings, as described above . Electronically Signed   By: Earle Gell M.D.   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  November 2018 PET scan- significant/excellent response to chemotherapy. ~1.2 cm lymph node in the retroperitoneum/SUV 2 [baseline-approximately 10 cm sized mass; with SUV of around 45].   #Discussed given the high risk disease; I would recommend close surveillance especially since low-grade SUV uptake is noted in the retroperitoneum.  Also educated the patient regarding the potential signs and symptoms of recurrence.   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 Significant improvement.  Of Neurontin.  #Discussed regarding the port at length- would like to keep it for the next one to 2 years. Recommend port flush every 2 months.   # Tachycardia- significant  improvement on metoprolol. Continue metoprolol 25 twice a day. New prescription given.  # port flush in 2 months; PET scan in 3 months/MD labs/port flush.  # I reviewed the blood work- with the patient in detail; also reviewed the imaging independently [as summarized above]; and with the patient in detail.    Cc: Dr.Miller; [place].   All questions were answered. The patient knows to call the clinic with any problems, questions or concerns.    Cammie Sickle, MD 07/11/2017 2:45 AM

## 2017-08-21 ENCOUNTER — Inpatient Hospital Stay: Payer: Medicare Other | Attending: Internal Medicine

## 2017-08-21 DIAGNOSIS — C8333 Diffuse large B-cell lymphoma, intra-abdominal lymph nodes: Secondary | ICD-10-CM | POA: Insufficient documentation

## 2017-08-21 DIAGNOSIS — Z9221 Personal history of antineoplastic chemotherapy: Secondary | ICD-10-CM | POA: Insufficient documentation

## 2017-08-21 DIAGNOSIS — Z452 Encounter for adjustment and management of vascular access device: Secondary | ICD-10-CM | POA: Insufficient documentation

## 2017-08-21 DIAGNOSIS — Z95828 Presence of other vascular implants and grafts: Secondary | ICD-10-CM

## 2017-08-21 MED ORDER — HEPARIN SOD (PORK) LOCK FLUSH 100 UNIT/ML IV SOLN
500.0000 [IU] | Freq: Once | INTRAVENOUS | Status: AC
  Administered 2017-08-21: 500 [IU] via INTRAVENOUS

## 2017-08-21 MED ORDER — SODIUM CHLORIDE 0.9% FLUSH
10.0000 mL | Freq: Once | INTRAVENOUS | Status: AC
  Administered 2017-08-21: 10 mL via INTRAVENOUS
  Filled 2017-08-21: qty 10

## 2017-09-25 ENCOUNTER — Ambulatory Visit: Payer: Medicare Other | Admitting: Radiation Oncology

## 2017-10-10 ENCOUNTER — Other Ambulatory Visit: Payer: Self-pay

## 2017-10-10 ENCOUNTER — Ambulatory Visit: Payer: Medicare Other

## 2017-10-10 ENCOUNTER — Encounter: Payer: Self-pay | Admitting: *Deleted

## 2017-10-11 ENCOUNTER — Ambulatory Visit: Payer: Medicare Other | Admitting: Internal Medicine

## 2017-10-11 ENCOUNTER — Other Ambulatory Visit: Payer: Medicare Other

## 2017-10-14 NOTE — Discharge Instructions (Signed)

## 2017-10-16 ENCOUNTER — Ambulatory Visit
Admission: RE | Admit: 2017-10-16 | Discharge: 2017-10-16 | Disposition: A | Discharge status: Home / Self Care | Payer: Medicare Other | Source: Ambulatory Visit | Attending: Ophthalmology | Admitting: Ophthalmology

## 2017-10-16 ENCOUNTER — Ambulatory Visit: Payer: Medicare Other | Admitting: Anesthesiology

## 2017-10-16 ENCOUNTER — Encounter
Admission: RE | Disposition: A | Discharge status: Home / Self Care | Payer: Self-pay | Source: Ambulatory Visit | Attending: Ophthalmology

## 2017-10-16 DIAGNOSIS — Z7984 Long term (current) use of oral hypoglycemic drugs: Secondary | ICD-10-CM | POA: Insufficient documentation

## 2017-10-16 DIAGNOSIS — G4730 Sleep apnea, unspecified: Secondary | ICD-10-CM | POA: Diagnosis not present

## 2017-10-16 DIAGNOSIS — K219 Gastro-esophageal reflux disease without esophagitis: Secondary | ICD-10-CM | POA: Insufficient documentation

## 2017-10-16 DIAGNOSIS — Z923 Personal history of irradiation: Secondary | ICD-10-CM | POA: Diagnosis not present

## 2017-10-16 DIAGNOSIS — Z8572 Personal history of non-Hodgkin lymphomas: Secondary | ICD-10-CM | POA: Insufficient documentation

## 2017-10-16 DIAGNOSIS — Z9221 Personal history of antineoplastic chemotherapy: Secondary | ICD-10-CM | POA: Diagnosis not present

## 2017-10-16 DIAGNOSIS — H179 Unspecified corneal scar and opacity: Secondary | ICD-10-CM | POA: Diagnosis not present

## 2017-10-16 DIAGNOSIS — J8410 Pulmonary fibrosis, unspecified: Secondary | ICD-10-CM | POA: Insufficient documentation

## 2017-10-16 DIAGNOSIS — Z79899 Other long term (current) drug therapy: Secondary | ICD-10-CM | POA: Insufficient documentation

## 2017-10-16 DIAGNOSIS — E7800 Pure hypercholesterolemia, unspecified: Secondary | ICD-10-CM | POA: Insufficient documentation

## 2017-10-16 DIAGNOSIS — E119 Type 2 diabetes mellitus without complications: Secondary | ICD-10-CM | POA: Insufficient documentation

## 2017-10-16 DIAGNOSIS — Z87891 Personal history of nicotine dependence: Secondary | ICD-10-CM | POA: Diagnosis not present

## 2017-10-16 DIAGNOSIS — H2512 Age-related nuclear cataract, left eye: Secondary | ICD-10-CM | POA: Insufficient documentation

## 2017-10-16 DIAGNOSIS — I10 Essential (primary) hypertension: Secondary | ICD-10-CM | POA: Diagnosis not present

## 2017-10-16 HISTORY — DX: Cardiac murmur, unspecified: R01.1

## 2017-10-16 HISTORY — PX: CATARACT EXTRACTION W/PHACO: SHX586

## 2017-10-16 LAB — GLUCOSE, CAPILLARY: GLUCOSE-CAPILLARY: 114 mg/dL — AB (ref 65–99)

## 2017-10-16 SURGERY — PHACOEMULSIFICATION, CATARACT, WITH IOL INSERTION
Anesthesia: Monitor Anesthesia Care | Site: Eye | Laterality: Left | Wound class: Clean

## 2017-10-16 MED ORDER — LACTATED RINGERS IV SOLN: INTRAVENOUS | Status: DC

## 2017-10-16 MED ORDER — BRIMONIDINE TARTRATE-TIMOLOL 0.2-0.5 % OP SOLN
OPHTHALMIC | Status: DC | PRN
Start: 2017-10-16 — End: 2017-10-16
  Administered 2017-10-16: 1 [drp] via OPHTHALMIC

## 2017-10-16 MED ORDER — ACETAMINOPHEN 160 MG/5ML PO SOLN: 325.0000 mg | ORAL | Status: DC | PRN

## 2017-10-16 MED ORDER — MIDAZOLAM HCL 2 MG/2ML IJ SOLN
INTRAMUSCULAR | Status: DC | PRN
  Administered 2017-10-16: 1 mg via INTRAVENOUS

## 2017-10-16 MED ORDER — FENTANYL CITRATE (PF) 100 MCG/2ML IJ SOLN
INTRAMUSCULAR | Status: DC | PRN
  Administered 2017-10-16: 50 ug via INTRAVENOUS

## 2017-10-16 MED ORDER — ACETAMINOPHEN 325 MG PO TABS: 650.0000 mg | ORAL_TABLET | Freq: Once | ORAL | Status: DC | PRN

## 2017-10-16 MED ORDER — NA HYALUR & NA CHOND-NA HYALUR 0.4-0.35 ML IO KIT
PACK | INTRAOCULAR | Status: DC | PRN
  Administered 2017-10-16: 1 mL via INTRAOCULAR

## 2017-10-16 MED ORDER — ONDANSETRON HCL 4 MG/2ML IJ SOLN: 4.0000 mg | Freq: Once | INTRAMUSCULAR | Status: DC | PRN

## 2017-10-16 MED ORDER — CEFUROXIME OPHTHALMIC INJECTION 1 MG/0.1 ML
INJECTION | OPHTHALMIC | Status: DC | PRN
  Administered 2017-10-16: .3 mL via OPHTHALMIC

## 2017-10-16 MED ORDER — ARMC OPHTHALMIC DILATING DROPS
1.0000 "application " | OPHTHALMIC | Status: DC | PRN
  Administered 2017-10-16 (×3): 1 via OPHTHALMIC

## 2017-10-16 MED ORDER — LIDOCAINE HCL (PF) 2 % IJ SOLN
INTRAOCULAR | Status: DC | PRN
  Administered 2017-10-16: 1 mL via INTRAMUSCULAR

## 2017-10-16 MED ORDER — EPINEPHRINE PF 1 MG/ML IJ SOLN
INTRAOCULAR | Status: DC | PRN
  Administered 2017-10-16: 86 mL via OPHTHALMIC

## 2017-10-16 MED ORDER — MOXIFLOXACIN HCL 0.5 % OP SOLN
1.0000 [drp] | OPHTHALMIC | Status: DC | PRN
  Administered 2017-10-16 (×3): 1 [drp] via OPHTHALMIC

## 2017-10-16 SURGICAL SUPPLY — 26 items
CANNULA ANT/CHMB 27GA (MISCELLANEOUS) ×3 IMPLANT
CARTRIDGE ABBOTT (MISCELLANEOUS) IMPLANT
GLOVE SURG LX 7.5 STRW (GLOVE) ×2
GLOVE SURG LX STRL 7.5 STRW (GLOVE) ×1 IMPLANT
GLOVE SURG TRIUMPH 8.0 PF LTX (GLOVE) ×3 IMPLANT
GOWN STRL REUS W/ TWL LRG LVL3 (GOWN DISPOSABLE) ×2 IMPLANT
GOWN STRL REUS W/TWL LRG LVL3 (GOWN DISPOSABLE) ×4
LENS IOL ACRYSOF IQ 20.5 (Intraocular Lens) ×3 IMPLANT
MARKER SKIN DUAL TIP RULER LAB (MISCELLANEOUS) ×3 IMPLANT
NDL RETROBULBAR .5 NSTRL (NEEDLE) IMPLANT
NEEDLE FILTER BLUNT 18X 1/2SAF (NEEDLE) ×2
NEEDLE FILTER BLUNT 18X1 1/2 (NEEDLE) ×1 IMPLANT
PACK CATARACT BRASINGTON (MISCELLANEOUS) ×3 IMPLANT
PACK EYE AFTER SURG (MISCELLANEOUS) ×3 IMPLANT
PACK OPTHALMIC (MISCELLANEOUS) ×3 IMPLANT
RETRACT FLEX IRIS (MISCELLANEOUS) ×3 IMPLANT
RING MALYGIN 7.0 (MISCELLANEOUS) IMPLANT
SUT ETHILON 10-0 CS-B-6CS-B-6 (SUTURE)
SUT VICRYL  9 0 (SUTURE)
SUT VICRYL 9 0 (SUTURE) IMPLANT
SUTURE EHLN 10-0 CS-B-6CS-B-6 (SUTURE) IMPLANT
SYR 3ML LL SCALE MARK (SYRINGE) ×3 IMPLANT
SYR 5ML LL (SYRINGE) ×3 IMPLANT
SYR TB 1ML LUER SLIP (SYRINGE) ×3 IMPLANT
WATER STERILE IRR 500ML POUR (IV SOLUTION) ×3 IMPLANT
WIPE NON LINTING 3.25X3.25 (MISCELLANEOUS) ×3 IMPLANT

## 2017-10-16 NOTE — Op Note (Signed)
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 Item Serial No. Manufacturer Lot No. LRB No. Used  LENS IOL ACRYSOF IQ 20.5 - [id number] Intraocular Lens LENS IOL ACRYSOF IQ 20.5 [id number] ALCON  Left 1        ULTRASOUND TIME: 21 % of 1 minutes, 25 seconds.  CDE 17.6   SURGEON:  Wyonia Hough, MD   ANESTHESIA: Topical with tetracaine drops and 2% Xylocaine jelly, augmented with 1% preservative-free intracameral lidocaine.   COMPLICATIONS:  None.   DESCRIPTION OF PROCEDURE:  The patient was identified in the holding room and transported to the operating room and placed in the supine position under the operating microscope.  The left eye was identified as the operative eye and it was prepped and draped in the usual sterile ophthalmic fashion.   A 1 millimeter clear-corneal paracentesis was made at the 3:00 position.  The anterior chamber was filled with Viscoat viscoelastic.  0.5 ml of preservative-free 1% lidocaine was injected into the anterior chamber.  A 2.4 millimeter keratome was used to make a near-clear corneal incision at the 12:00 position. Four additional 39mm paracentesis incisions were made.  Flexible iris hooks were placed through each incision to dilate the pupil to approximately 16mm.  Additional Viscoat was placed in the anterior chamber.  A cystotome and capsulorrhexis forceps were used to make a curvilinear capsulorrhexis.   Balanced salt solution was used to hydrodissect and hydrodelineate the lens nucleus.   Phacoemulsification was used in stop and chop fashion to  remove the lens, nucleus and epinucleus.  The remaining cortex was aspirated using the irrigation aspiration handpiece.  Additional Provisc was placed into the eye to distend the capsular bag for lens placement.  A lens was then injected into the capsular bag.  The iris hooks were removed. The remaining viscoelastic was aspirated from the capsular bag and the anterior chamber.  The anterior chamber was filled with balanced salt solution to inflate to a physiologic pressure.   Wounds were hydrated with balanced salt solution.  The anterior chamber was inflated to a physiologic pressure with balanced salt solution.  No wound leaks were noted. Cefuroxime 0.1 ml of a 10mg /ml solution was injected into the anterior chamber for a dose of 1 mg of intracameral antibiotic at the completion of the case.   Timolol and Brimonidine drops were applied to the eye.  The patient was taken to the recovery room in stable condition without complications of anesthesia or surgery.  Kenita Bines 10/16/2017, 9:28 AM

## 2017-10-16 NOTE — H&P (Signed)
The History and Physical notes are on paper, have been signed, and are to be scanned. The patient remains stable and unchanged from the H&P.   Previous H&P reviewed, patient examined, and there are no changes.  [place] 10/16/2017 8:38 AM

## 2017-10-16 NOTE — Transfer of Care (Signed)
Immediate Anesthesia Transfer of Care Note  Patient: Rose Hill  Procedure(s) Performed: CATARACT EXTRACTION PHACO AND INTRAOCULAR LENS PLACEMENT (IOC) COMPLICATED DIABETIC LEFT (Left Eye)  Patient Location: PACU  Anesthesia Type: MAC  Level of Consciousness: awake, alert  and patient cooperative  Airway and Oxygen Therapy: Patient Spontanous Breathing and Patient connected to supplemental oxygen  Post-op Assessment: Post-op Vital signs reviewed, Patient's Cardiovascular Status Stable, Respiratory Function Stable, Patent Airway and No signs of Nausea or vomiting  Post-op Vital Signs: Reviewed and stable  Complications: No apparent anesthesia complications

## 2017-10-16 NOTE — Anesthesia Procedure Notes (Signed)
Procedure Name: MAC Performed by: Brayson Livesey, CRNA Pre-anesthesia Checklist: Patient identified, Emergency Drugs available, Suction available, Patient being monitored and Timeout performed Patient Re-evaluated:Patient Re-evaluated prior to induction Oxygen Delivery Method: Nasal cannula       

## 2017-10-16 NOTE — Anesthesia Postprocedure Evaluation (Signed)
Anesthesia Post Note  Patient: Rose Hill  Procedure(s) Performed: CATARACT EXTRACTION PHACO AND INTRAOCULAR LENS PLACEMENT (IOC) COMPLICATED DIABETIC LEFT (Left Eye)  Patient location during evaluation: PACU Anesthesia Type: MAC Level of consciousness: awake and alert, oriented and patient cooperative Pain management: pain level controlled Vital Signs Assessment: post-procedure vital signs reviewed and stable Respiratory status: spontaneous breathing, nonlabored ventilation and respiratory function stable Cardiovascular status: blood pressure returned to baseline and stable Postop Assessment: adequate PO intake Anesthetic complications: no    Darrin Nipper

## 2017-10-16 NOTE — Anesthesia Preprocedure Evaluation (Signed)
Anesthesia Evaluation  Patient identified by MRN, date of birth, ID band Patient awake    Reviewed: Allergy & Precautions, NPO status , Patient's Chart, lab work & pertinent test results  History of Anesthesia Complications Negative for: history of anesthetic complications  Airway Mallampati: I  TM Distance: >3 FB Neck ROM: Full    Dental no notable dental hx.    Pulmonary sleep apnea , former smoker (quit 1960),  Interstitial lung disease   Pulmonary exam normal breath sounds clear to auscultation       Cardiovascular Exercise Tolerance: Good hypertension, + Valvular Problems/Murmurs (murmur)  Rhythm:Regular Rate:Normal + Systolic murmurs    Neuro/Psych negative neurological ROS     GI/Hepatic negative GI ROS,   Endo/Other  diabetes, Type 2  Renal/GU negative Renal ROS     Musculoskeletal   Abdominal   Peds  Hematology DLBCL s/p radiation and chemo   Anesthesia Other Findings   Reproductive/Obstetrics                             Anesthesia Physical Anesthesia Plan  ASA: II  Anesthesia Plan: MAC   Post-op Pain Management:    Induction: Intravenous  PONV Risk Score and Plan: 2 and TIVA and Midazolam  Airway Management Planned: Natural Airway  Additional Equipment:   Intra-op Plan:   Post-operative Plan:   Informed Consent: I have reviewed the patients History and Physical, chart, labs and discussed the procedure including the risks, benefits and alternatives for the proposed anesthesia with the patient or authorized representative who has indicated his/her understanding and acceptance.     Plan Discussed with: CRNA  Anesthesia Plan Comments:         Anesthesia Quick Evaluation

## 2017-10-22 ENCOUNTER — Other Ambulatory Visit: Payer: Self-pay | Admitting: Internal Medicine

## 2017-10-22 ENCOUNTER — Ambulatory Visit
Admission: RE | Admit: 2017-10-22 | Discharge: 2017-10-22 | Disposition: A | Discharge status: Home / Self Care | Payer: Medicare Other | Source: Ambulatory Visit | Attending: Internal Medicine | Admitting: Internal Medicine

## 2017-10-22 DIAGNOSIS — M799 Soft tissue disorder, unspecified: Secondary | ICD-10-CM | POA: Insufficient documentation

## 2017-10-22 DIAGNOSIS — C8333 Diffuse large B-cell lymphoma, intra-abdominal lymph nodes: Secondary | ICD-10-CM | POA: Diagnosis not present

## 2017-10-22 LAB — GLUCOSE, CAPILLARY: GLUCOSE-CAPILLARY: 107 mg/dL — AB (ref 65–99)

## 2017-10-22 MED ORDER — FLUDEOXYGLUCOSE F - 18 (FDG) INJECTION
8.2900 | Freq: Once | INTRAVENOUS | Status: AC | PRN
  Administered 2017-10-22: 8.29 via INTRAVENOUS

## 2017-10-22 NOTE — Progress Notes (Signed)
Will review imaging at the tumor conference.

## 2017-10-23 ENCOUNTER — Ambulatory Visit
Admission: RE | Admit: 2017-10-23 | Discharge: 2017-10-23 | Disposition: A | Discharge status: Home / Self Care | Payer: Medicare Other | Source: Ambulatory Visit | Attending: Radiation Oncology | Admitting: Radiation Oncology

## 2017-10-23 ENCOUNTER — Inpatient Hospital Stay: Payer: Medicare Other | Attending: Internal Medicine

## 2017-10-23 ENCOUNTER — Inpatient Hospital Stay (HOSPITAL_BASED_OUTPATIENT_CLINIC_OR_DEPARTMENT_OTHER): Payer: Medicare Other | Admitting: Internal Medicine

## 2017-10-23 ENCOUNTER — Other Ambulatory Visit: Payer: Self-pay | Admitting: *Deleted

## 2017-10-23 ENCOUNTER — Other Ambulatory Visit: Payer: Self-pay

## 2017-10-23 VITALS — BP 147/85 | HR 83 | Temp 100.6°F | Resp 20 | Ht 67.0 in | Wt 156.0 lb

## 2017-10-23 DIAGNOSIS — Z7984 Long term (current) use of oral hypoglycemic drugs: Secondary | ICD-10-CM | POA: Diagnosis not present

## 2017-10-23 DIAGNOSIS — E7800 Pure hypercholesterolemia, unspecified: Secondary | ICD-10-CM | POA: Insufficient documentation

## 2017-10-23 DIAGNOSIS — Z79899 Other long term (current) drug therapy: Secondary | ICD-10-CM | POA: Diagnosis not present

## 2017-10-23 DIAGNOSIS — Z792 Long term (current) use of antibiotics: Secondary | ICD-10-CM

## 2017-10-23 DIAGNOSIS — J849 Interstitial pulmonary disease, unspecified: Secondary | ICD-10-CM

## 2017-10-23 DIAGNOSIS — C8339 Diffuse large B-cell lymphoma, extranodal and solid organ sites: Secondary | ICD-10-CM | POA: Diagnosis present

## 2017-10-23 DIAGNOSIS — G620 Drug-induced polyneuropathy: Secondary | ICD-10-CM | POA: Diagnosis not present

## 2017-10-23 DIAGNOSIS — T451X5S Adverse effect of antineoplastic and immunosuppressive drugs, sequela: Secondary | ICD-10-CM

## 2017-10-23 DIAGNOSIS — E119 Type 2 diabetes mellitus without complications: Secondary | ICD-10-CM | POA: Diagnosis not present

## 2017-10-23 DIAGNOSIS — Z87891 Personal history of nicotine dependence: Secondary | ICD-10-CM | POA: Insufficient documentation

## 2017-10-23 DIAGNOSIS — M21379 Foot drop, unspecified foot: Secondary | ICD-10-CM | POA: Diagnosis not present

## 2017-10-23 DIAGNOSIS — Z9221 Personal history of antineoplastic chemotherapy: Secondary | ICD-10-CM | POA: Diagnosis not present

## 2017-10-23 DIAGNOSIS — I10 Essential (primary) hypertension: Secondary | ICD-10-CM

## 2017-10-23 DIAGNOSIS — C8333 Diffuse large B-cell lymphoma, intra-abdominal lymph nodes: Secondary | ICD-10-CM | POA: Insufficient documentation

## 2017-10-23 DIAGNOSIS — Z923 Personal history of irradiation: Secondary | ICD-10-CM | POA: Diagnosis not present

## 2017-10-23 DIAGNOSIS — H9202 Otalgia, left ear: Secondary | ICD-10-CM | POA: Diagnosis not present

## 2017-10-23 DIAGNOSIS — R509 Fever, unspecified: Secondary | ICD-10-CM

## 2017-10-23 DIAGNOSIS — Z95828 Presence of other vascular implants and grafts: Secondary | ICD-10-CM

## 2017-10-23 DIAGNOSIS — I2510 Atherosclerotic heart disease of native coronary artery without angina pectoris: Secondary | ICD-10-CM | POA: Insufficient documentation

## 2017-10-23 LAB — CBC WITH DIFFERENTIAL/PLATELET
BASOS ABS: 0 10*3/uL (ref 0–0.1)
BASOS PCT: 1 %
EOS PCT: 3 %
Eosinophils Absolute: 0.2 10*3/uL (ref 0–0.7)
HCT: 34.6 % — ABNORMAL LOW (ref 35.0–47.0)
Hemoglobin: 11.6 g/dL — ABNORMAL LOW (ref 12.0–16.0)
LYMPHS PCT: 13 %
Lymphs Abs: 0.7 10*3/uL — ABNORMAL LOW (ref 1.0–3.6)
MCH: 29.6 pg (ref 26.0–34.0)
MCHC: 33.5 g/dL (ref 32.0–36.0)
MCV: 88.1 fL (ref 80.0–100.0)
Monocytes Absolute: 0.8 10*3/uL (ref 0.2–0.9)
Monocytes Relative: 13 %
NEUTROS ABS: 4.1 10*3/uL (ref 1.4–6.5)
Neutrophils Relative %: 70 %
PLATELETS: 155 10*3/uL (ref 150–440)
RBC: 3.92 MIL/uL (ref 3.80–5.20)
RDW: 13.5 % (ref 11.5–14.5)
WBC: 5.8 10*3/uL (ref 3.6–11.0)

## 2017-10-23 LAB — COMPREHENSIVE METABOLIC PANEL
ALK PHOS: 68 U/L (ref 38–126)
ALT: 9 U/L — ABNORMAL LOW (ref 14–54)
ANION GAP: 7 (ref 5–15)
AST: 14 U/L — ABNORMAL LOW (ref 15–41)
Albumin: 3.8 g/dL (ref 3.5–5.0)
BILIRUBIN TOTAL: 0.5 mg/dL (ref 0.3–1.2)
BUN: 10 mg/dL (ref 6–20)
CALCIUM: 8.9 mg/dL (ref 8.9–10.3)
CO2: 26 mmol/L (ref 22–32)
Chloride: 106 mmol/L (ref 101–111)
Creatinine, Ser: 0.32 mg/dL — ABNORMAL LOW (ref 0.44–1.00)
GFR calc Af Amer: >60 mL/min (ref >60)
GLUCOSE: 99 mg/dL (ref 65–99)
Potassium: 3.8 mmol/L (ref 3.5–5.1)
Sodium: 139 mmol/L (ref 135–145)
TOTAL PROTEIN: 6.8 g/dL (ref 6.5–8.1)

## 2017-10-23 LAB — LACTATE DEHYDROGENASE: LDH: 205 U/L — ABNORMAL HIGH (ref 98–192)

## 2017-10-23 MED ORDER — SUCRALFATE 1 G PO TABS: ORAL_TABLET | ORAL | 6 refills | Status: DC

## 2017-10-23 MED ORDER — SODIUM CHLORIDE 0.9% FLUSH
10.0000 mL | INTRAVENOUS | Status: AC | PRN
  Administered 2017-10-23: 10 mL
  Filled 2017-10-23: qty 10

## 2017-10-23 MED ORDER — AMOXICILLIN-POT CLAVULANATE 875-125 MG PO TABS: 1.0000 | ORAL_TABLET | Freq: Two times a day (BID) | ORAL | 0 refills | Status: DC

## 2017-10-23 MED ORDER — HEPARIN SOD (PORK) LOCK FLUSH 100 UNIT/ML IV SOLN
500.0000 [IU] | INTRAVENOUS | Status: AC | PRN
  Administered 2017-10-23: 500 [IU]

## 2017-10-23 NOTE — Progress Notes (Signed)
Radiation Oncology old patient new area recurrent lymphoma Follow up Note  Name: Rose Hill   Date:   10/23/2017 MRN:  [id number] DOB: 02-24-1946    This 72 y.o. female presents to the clinic today for probable recurrent diffuse large B-cell lymphoma with new left infrarenal mass.  REFERRING PROVIDER: Rusty Aus, MD  HPI: Patient is a 72 year old female treated close to year ago with involved field radiation therapy for triple hit diffuse B-cell lymphoma who completed consolidative radiation therapy 03/18/2017.Marland Kitchen She has done well with excellent response by PET CT scan criteria with prior PET scan November 2018. She's been under active surveillance with recent PET CT scan done yesterday showing a new hypermetabolic 4.3 cm soft tissue mass in the left infrarenal region worrisome for recurrent lymphoma [place] 5. She has been having some fevers has been placed on antibiotic therapy by medical oncology. She specifically denies chills night sweats or weight loss. She's been having some abdominal discomfort over the past several weeks taking Protonix for that. The area on PET/CT were I previously treated in the retroperitoneal region shows no evidence of persistent or recurrent disease.  COMPLICATIONS OF TREATMENT: none  FOLLOW UP COMPLIANCE: keeps appointments   PHYSICAL EXAM:  There were no vitals taken for this visit. No peripheral adenopathy is detected. She does have some tenderness present in the left lower quadrant of her abdomen. Well-developed well-nourished patient in NAD. HEENT reveals PERLA, EOMI, discs not visualized.  Oral cavity is clear. No oral mucosal lesions are identified. Neck is clear without evidence of cervical or supraclavicular adenopathy. Lungs are clear to A&P. Cardiac examination is essentially unremarkable with regular rate and rhythm without murmur rub or thrill. Abdomen is benign with no organomegaly or masses noted. Motor sensory and DTR levels are equal and  symmetric in the upper and lower extremities. Cranial nerves II through XII are grossly intact. Proprioception is intact. No peripheral adenopathy or edema is identified. No motor or sensory levels are noted. Crude visual fields are within normal range.  RADIOLOGY RESULTS: PET CT scan reviewed and compatible above-stated findings  PLAN: Present time we will present her case at our weekly tumor conference. Anticipate a biopsy of this area to  for confirmation of lymphoma or whether this may have dedifferentiated. Will discuss with medical oncology possibility of further chemotherapy. I can add additional involved field radiation therapy since since this is outside her previous treatment fields without significant side effect. All of this was explained to the patient and her husband. Will follow-up with her accordingly as per medical oncology's wishes. Patient and husband both seem to comprehend my treatment plan well. I have started her on some Carafate 3 times a day to help with some of her abdominal complaints.  I would like to take this opportunity to thank you for allowing me to participate in the care of your patient.Noreene Filbert, MD

## 2017-10-23 NOTE — Progress Notes (Signed)
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  female with above history of diffuse large B-cell lymphoma [triple hit] currently on DA- [facility] s/p 6 cycles; followed by consolidation radiation finished in July 2018 is here to review the results of the PET scan.  Patient complains of low-grade fevers the last 3 days.  Complains of left ear pain.  No unusual cough or chest pain or shortness of breath.   General appetite is good.  Denies any abdominal pain nausea vomiting. Her neuropathy has significantly improved; since she did physical therapy. She is able to walk by herself. Intermittent tingling and numbness.    ROS: A complete 10 point review of system is done which is negative except mentioned above in history of present illness  MEDICAL HISTORY:  Past Medical History:  Diagnosis Date  . Diabetes mellitus without complication ([place])   . Heart murmur   . History of chemotherapy 07/08/2017  . History of gastric ulcer   . History of radiation therapy 07/08/2017  . Hypercholesterolemia    . Hypertension   . ILD (interstitial lung disease) ([place])    8 yrs ago  . Lymphadenopathy     SURGICAL HISTORY: Past Surgical History:  Procedure Laterality Date  . BREAST BIOPSY Left 2010   neg  . CATARACT EXTRACTION W/PHACO Left 10/16/2017   Procedure: CATARACT EXTRACTION PHACO AND INTRAOCULAR LENS PLACEMENT ([place]) COMPLICATED DIABETIC LEFT;  Surgeon: Leandrew Koyanagi, MD;  Location: [place];  Service: Ophthalmology;  Laterality: Left;  IRIS HOOKS Diabetic - oral meds  . HERNIA REPAIR    . PARTIAL HYSTERECTOMY     age 85  . PERIPHERAL VASCULAR CATHETERIZATION N/A 08/22/2016   Procedure: [name];  Surgeon: [name], MD;  Location: [place] [facility];  Service: Cardiovascular;  Laterality: N/A;  . TONSILLECTOMY      SOCIAL HISTORY: quit smoking 40 years ago; human resources retd; in [place]; no alcohol Social History   Socioeconomic History  . Marital status: Married    Spouse name: [place]  . Number of children: 2  . Years of education: Not on file  . Highest education level: Not on file  Social Needs  . Financial resource strain: Not on file  . Food insecurity - worry: Not on file  . Food insecurity - inability: Not on file  . Transportation needs - medical: Not on file  . Transportation needs - non-medical: Not on file  Occupational History  . Occupation: Retired  Tobacco Use  .  Smoking status: Former Smoker    Last attempt to quit: 08/21/1958    Years since quitting: 59.2  . Smokeless tobacco: Never Used  Substance and Sexual Activity  . Alcohol use: No  . Drug use: No  . Sexual activity: No  Other Topics Concern  . Not on file  Social History Narrative   Not applicable at this time    FAMILY HISTORY: Family History  Problem Relation Age of Onset  . Heart disease Mother   . Heart disease Father   . Heart disease Brother     ALLERGIES:  is allergic to levaquin [levofloxacin in d5w]; cefuroxime axetil; and  doxycycline.  MEDICATIONS:  Current Outpatient Medications  Medication Sig Dispense Refill  . gabapentin (NEURONTIN) 300 MG capsule Take 1 capsule (300 mg total) by mouth 2 (two) times daily. 180 capsule 4  . lidocaine-prilocaine (EMLA) cream Apply cream 1 hour before chemotherapy treatment, place small amount of saran wrap over cream to protect clothing. 30 g 1  . metFORMIN (GLUCOPHAGE) 500 MG tablet Take 250 mg by mouth daily with breakfast.     . metoprolol tartrate (LOPRESSOR) 25 MG tablet Take 1 tablet (25 mg total) by mouth 2 (two) times daily. 180 tablet 4  . pantoprazole (PROTONIX) 40 MG tablet Take 1 tablet by mouth 2 (two) times daily.    . pravastatin (PRAVACHOL) 40 MG tablet Take 1 tablet by mouth daily.    Marland Kitchen amoxicillin-clavulanate (AUGMENTIN) 875-125 MG tablet Take 1 tablet by mouth 2 (two) times daily. 14 tablet 0  . b complex vitamins tablet Take 1 tablet by mouth daily.    Marland Kitchen latanoprost (XALATAN) 0.005 % ophthalmic solution Place 1 drop into both eyes at bedtime.     . magic mouthwash w/lidocaine SOLN Take 5 mLs by mouth 4 (four) times daily. 80 ml viscous lidocaine 2%, 80 ml Mylanta, 80 ml Diphenhydramine 12.5 mg/5 ml Elixir, 80 ml Nystatin 100,000 Unit suspension, 80 ml Prednisolone 15 mg/33ml, 80 ml Distilled Water. Sig: Swish/Swallow 5-10 ml four times a day as needed. Dispense 480 ml. 3RFs (Patient not taking: Reported on 10/10/2017) 480 mL 3  . Magnesium 400 MG TABS Take 1 tablet by mouth daily.    . ondansetron (ZOFRAN) 8 MG tablet TAKE 1 TABLET BY MOUTH EVERY 8 HOURS AS NEEDED FOR NAUSEA AND VOMITING (TAKE TWICE DAILY FOR THE FIRST 3 DAYS AFTER CHEMOTHERAPY) (Patient not taking: Reported on 10/10/2017) 30 tablet 0  . potassium chloride SA (K-DUR,KLOR-CON) 20 MEQ tablet 1 pill twice a day (Patient not taking: Reported on 10/10/2017) 40 tablet 3  . prochlorperazine (COMPAZINE) 10 MG tablet Take 1 tablet (10 mg total) by mouth every 6 (six) hours as needed for nausea, vomiting or  refractory nausea / vomiting. (Patient not taking: Reported on 10/10/2017) 30 tablet 0  . sucralfate (CARAFATE) 1 g tablet Take 1/2 tablet 2 times daily. 30 tablet 6   No current facility-administered medications for this visit.    Facility-Administered Medications Ordered in Other Visits  Medication Dose Route Frequency Provider Last Rate Last Dose  . 0.9 %  sodium chloride infusion   Intravenous Continuous Cammie Sickle, MD   Stopped at 11/05/16 1217  . 0.9 %  sodium chloride infusion   Intravenous Continuous Cammie Sickle, MD   Stopped at 12/03/16 1345  . 0.9 %  sodium chloride infusion   Intravenous Continuous Cammie Sickle, MD 10 mL/hr at 12/05/16 1030    . 0.9 %  sodium chloride  infusion   Intravenous Continuous Cammie Sickle, MD   Stopped at 12/24/16 1256  . 0.9 %  sodium chloride infusion   Intravenous Continuous Cammie Sickle, MD   Stopped at 12/26/16 1011  . 0.9 %  sodium chloride infusion   Intravenous Continuous Charlaine Dalton R, MD 10 mL/hr at 12/27/16 0930    . heparin lock flush 100 unit/mL  500 Units Intravenous Once Charlaine Dalton R, MD      . heparin lock flush 100 unit/mL  500 Units Intracatheter Once PRN Charlaine Dalton R, MD      . heparin lock flush 100 unit/mL  500 Units Intravenous Once Charlaine Dalton R, MD      . sodium chloride flush (NS) 0.9 % injection 10 mL  10 mL Intravenous PRN Cammie Sickle, MD   10 mL at 11/06/16 1000  . sodium chloride flush (NS) 0.9 % injection 10 mL  10 mL Intravenous PRN Cammie Sickle, MD   10 mL at 11/07/16 0905  . sodium chloride flush (NS) 0.9 % injection 10 mL  10 mL Intravenous PRN Charlaine Dalton R, MD      . sodium chloride flush (NS) 0.9 % injection 10 mL  10 mL Intracatheter PRN Charlaine Dalton R, MD      . sodium chloride flush (NS) 0.9 % injection 10 mL  10 mL Intravenous PRN Cammie Sickle, MD   10 mL at 12/05/16 1030  . sodium chloride flush  (NS) 0.9 % injection 10 mL  10 mL Intravenous PRN Cammie Sickle, MD   10 mL at 12/27/16 0930      .  PHYSICAL EXAMINATION: ECOG PERFORMANCE STATUS: 1 - Symptomatic but completely ambulatory  Vitals:   10/23/17 1330  BP: (!) 147/85  Pulse: 83  Resp: 20  Temp: (!) 100.6 F (38.1 C)   Filed Weights   10/23/17 1330  Weight: 156 lb (70.8 kg)    GENERAL: Well-nourished well-developed; Alert, no distress and comfortable.   With her husband. EYES: no pallor or icterus;  OROPHARYNX: no thrush or ulceration; good dentition  NECK: supple, no masses felt LYMPH:  no palpable lymphadenopathy in the cervical, axillary or inguinal regions LUNGS: Positive for crackles at the bases auscultation and  No wheeze.  HEART/CVS: regular rate & rhythm and no murmurs; No lower extremity edema ABDOMEN: abdomen soft, no tenderness; normal bowel sounds.  PSYCH: alert & oriented x 3 with fluent speech NEURO: no focal motor/sensory deficits. Resolved. SKIN:  no rashes or significant lesions  LABORATORY DATA:  I have reviewed the data as listed Lab Results  Component Value Date   WBC 5.8 10/23/2017   HGB 11.6 (L) 10/23/2017   HCT 34.6 (L) 10/23/2017   MCV 88.1 10/23/2017   PLT 155 10/23/2017   Recent Labs    03/18/17 1422 07/10/17 1101 10/23/17 1316  NA 138 138 139  K 4.0 4.0 3.8  CL 107 104 106  CO2 24 26 26   GLUCOSE 96 100* 99  BUN 11 19 10   CREATININE 0.48 0.67 0.32*  CALCIUM 8.8* 8.9 8.9  GFRNONAA >60 >60 >60  GFRAA >60 >60 >60  PROT 6.0* 6.5 6.8  ALBUMIN 3.8 3.7 3.8  AST 16 21 14*  ALT 9* 12* 9*  ALKPHOS 44 54 68  BILITOT 0.3 0.4 0.5    RADIOGRAPHIC STUDIES: I have personally reviewed the radiological images as listed and agreed with the findings in the report. Nm Pet Image Restag (ps)  Skull Base To Thigh  Result Date: 10/22/2017 CLINICAL DATA:  Subsequent treatment strategy for lymphoma. EXAM: NUCLEAR MEDICINE PET SKULL BASE TO THIGH TECHNIQUE: 8.29 mCi F-18 FDG was  injected intravenously. Full-ring PET imaging was performed from the skull base to thigh after the radiotracer. CT data was obtained and used for attenuation correction and anatomic localization. Fasting blood glucose: 107 mg/dl Mediastinal blood pool activity: SUV max 2.2 COMPARISON:  PET-CT dated 07/08/2017 FINDINGS: NECK: No hypermetabolic cervical lymphadenopathy. Incidental CT findings: none CHEST: No hypermetabolic pulmonary nodules. No hypermetabolic thoracic lymphadenopathy. Incidental CT findings: Subpleural reticulation/fibrosis in the lungs bilaterally, grossly unchanged. Mild cardiomegaly. No pericardial effusion. No evidence of thoracic aortic aneurysm. Mild atherosclerotic calcifications of the aortic arch. Mild three-vessel coronary atherosclerosis. Right chest port terminating at the cavoatrial junction. ABDOMEN/PELVIS: Mild residual nodal soft tissue in the left para-aortic region measuring up to 9 mm short axis (series 3/image 150), max SUV 2.5, previously 10 mm with max SUV 2.2. New 3.7 x 4.3 cm soft tissue mass inferior to the left kidney, in the left perirenal space (series 3/image 164), max SUV 15.5. Although technically indeterminate, this appearance is considered worrisome for recurrent lymphoma. Surrounding soft tissue stranding. Otherwise, no hypermetabolic abdominopelvic lymphadenopathy. No abnormal metabolism involving the liver, spleen, pancreas, or adrenal glands. Incidental CT findings: Moderate hiatal hernia. Mild atherosclerotic calcifications the abdominal aorta and branch vessels. Sigmoid diverticulosis, without evidence of diverticulitis. Prior hysterectomy. SKELETON: No focal hypermetabolic activity to suggest skeletal metastasis. Incidental CT findings: none IMPRESSION: New hypermetabolic 4.3 cm soft tissue mass inferior to the left kidney, worrisome for recurrent lymphoma. [place] 5. Mild residual nodal soft tissue in the left para-aortic region, favoring treated lymphoma,  stable versus mildly decreased. Additional stable ancillary findings as above. Electronically Signed   By: Julian Hy M.D.   On: 10/22/2017 12:52  IMPRESSION: New hypermetabolic 4.3 cm soft tissue mass inferior to the left kidney, worrisome for recurrent lymphoma. [place] 5.  Mild residual nodal soft tissue in the left para-aortic region, favoring treated lymphoma, stable versus mildly decreased.  Additional stable ancillary findings as above.   Electronically Signed   By: Julian Hy M.D.   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 March 2019- PET scan-~0.26mm lymph node in the retroperitoneum/SUV 2 [baseline-approximately 10 cm sized mass; with SUV of around 45]; However, approximately 4 cm soft tissue mass noted inferior to the left kidney SUV 15 highly concerning for recurrence.  #Long discussion the patient has been regarding my concerns for recurrence; however will need a biopsy to confirm the recurrence.   # If biopsies positive-for lymphoma-for curative intent patient for need chemotherapy R GDP/ RICE-followed by autologous stem cell transplant.  However she is not a candidate for transplant-palliative options include R GDP/rituximab Revlimid/ibrutinib etc.  Also offered evaluation at [facility] or [facility] given the relapsed disease.  Patient will check with insurance and let us know/otherwise she has no preference.    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 Significant improvement.  Of Neurontin.  #Low-grade fever/ear pain question URI versus others-like lymphoma.  Recommend Augmentin for 7 days.  #Discussed with Dr. Donella Stade; will discuss at [facility] tomorrow; [phone]; TBD; plan follow up after Bx.  # I reviewed the blood  work- with the patient in detail; also reviewed the  imaging independently [as summarized above]; and with the patient in detail.   # 40 minutes face-to-face with the patient discussing the above plan of care; more than 50% of time spent on prognosis/ natural history; counseling and coordination.   Cc: Dr.Miller  All questions were answered. The patient knows to call the clinic with any problems, questions or concerns.    Cammie Sickle, MD 10/23/2017 4:56 PM

## 2017-10-23 NOTE — Assessment & Plan Note (Addendum)
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 March 2019- PET scan-~0.51mm lymph node in the retroperitoneum/SUV 2 [baseline-approximately 10 cm sized mass; with SUV of around 45]; However, approximately 4 cm soft tissue mass noted inferior to the left kidney SUV 15 highly concerning for recurrence.  #Long discussion the patient has been regarding my concerns for recurrence; however will need a biopsy to confirm the recurrence.   # If biopsies positive-for lymphoma-for curative intent patient for need chemotherapy R GDP/ RICE-followed by autologous stem cell transplant.  However she is not a candidate for transplant-palliative options include R GDP/rituximab Revlimid/ibrutinib etc.  Also offered evaluation at [facility] or [facility] given the relapsed disease.  Patient will check with insurance and let us know/otherwise she has no preference.    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 Significant improvement.  Of Neurontin.  #Low-grade fever/ear pain question URI versus others-like lymphoma.  Recommend Augmentin for 7 days.  #Discussed with Dr. Donella Stade; will discuss at [facility] tomorrow; [phone]; TBD; plan follow up after Bx.  # I reviewed the blood work- with the patient in detail; also reviewed the imaging independently [as summarized above]; and with the patient in detail.   # 40 minutes face-to-face with the patient discussing the above plan of care; more than 50% of time spent on prognosis/ natural history; counseling and coordination.   Cc: Dr.Miller

## 2017-10-24 ENCOUNTER — Ambulatory Visit: Payer: Medicare Other | Admitting: Radiation Oncology

## 2017-10-25 ENCOUNTER — Other Ambulatory Visit: Payer: Self-pay | Admitting: Internal Medicine

## 2017-10-25 ENCOUNTER — Telehealth: Payer: Self-pay | Admitting: Internal Medicine

## 2017-10-25 DIAGNOSIS — C8333 Diffuse large B-cell lymphoma, intra-abdominal lymph nodes: Secondary | ICD-10-CM

## 2017-10-25 NOTE — Telephone Encounter (Signed)
Referral has been placed to Dr. Thera Flake, and CT guided biopsy form has been faxed.

## 2017-10-25 NOTE — Telephone Encounter (Signed)
Spoke to pt re: recommendations at the tumor conference regarding CT-guided biopsy.  Also discussed regarding [facility] referral patient is interested; patient agrees me talking to one of the docs at [facility];   Please make referral to Dr.Grover at [facility] [Dx: lymphoma];   Also set up for CT biopsy ASAP. thx

## 2017-10-28 ENCOUNTER — Telehealth: Payer: Self-pay | Admitting: *Deleted

## 2017-10-28 NOTE — Telephone Encounter (Addendum)
Returned phone call to [place] care coordinator. Left msg that call was being returned. [phone]

## 2017-10-30 ENCOUNTER — Other Ambulatory Visit: Payer: Self-pay | Admitting: Radiology

## 2017-10-31 ENCOUNTER — Ambulatory Visit
Admission: RE | Admit: 2017-10-31 | Discharge: 2017-10-31 | Disposition: A | Discharge status: Home / Self Care | Payer: Medicare Other | Source: Ambulatory Visit | Attending: Internal Medicine | Admitting: Internal Medicine

## 2017-10-31 DIAGNOSIS — C8333 Diffuse large B-cell lymphoma, intra-abdominal lymph nodes: Secondary | ICD-10-CM | POA: Insufficient documentation

## 2017-10-31 LAB — CBC
HCT: 35.9 % (ref 35.0–47.0)
HEMOGLOBIN: 12 g/dL (ref 12.0–16.0)
MCH: 29.2 pg (ref 26.0–34.0)
MCHC: 33.4 g/dL (ref 32.0–36.0)
MCV: 87.4 fL (ref 80.0–100.0)
Platelets: 237 10*3/uL (ref 150–440)
RBC: 4.11 MIL/uL (ref 3.80–5.20)
RDW: 13.8 % (ref 11.5–14.5)
WBC: 4.7 10*3/uL (ref 3.6–11.0)

## 2017-10-31 LAB — APTT: APTT: 34 s (ref 24–36)

## 2017-10-31 LAB — GLUCOSE, CAPILLARY: Glucose-Capillary: 96 mg/dL (ref 65–99)

## 2017-10-31 LAB — PROTIME-INR
INR: 0.92
PROTHROMBIN TIME: 12.3 s (ref 11.4–15.2)

## 2017-10-31 MED ORDER — LIDOCAINE HCL (PF) 1 % IJ SOLN
INTRAMUSCULAR | Status: AC | PRN
  Administered 2017-10-31: 4 mL

## 2017-10-31 MED ORDER — HEPARIN SOD (PORK) LOCK FLUSH 100 UNIT/ML IV SOLN
INTRAVENOUS | Status: AC
  Filled 2017-10-31: qty 5

## 2017-10-31 MED ORDER — FENTANYL CITRATE (PF) 100 MCG/2ML IJ SOLN
INTRAMUSCULAR | Status: AC
  Filled 2017-10-31: qty 4

## 2017-10-31 MED ORDER — FENTANYL CITRATE (PF) 100 MCG/2ML IJ SOLN
INTRAMUSCULAR | Status: AC | PRN
  Administered 2017-10-31: 50 ug via INTRAVENOUS

## 2017-10-31 MED ORDER — SODIUM CHLORIDE 0.9 % IV SOLN: INTRAVENOUS | Status: DC

## 2017-10-31 MED ORDER — MIDAZOLAM HCL 5 MG/5ML IJ SOLN
INTRAMUSCULAR | Status: AC
  Filled 2017-10-31: qty 5

## 2017-10-31 MED ORDER — HEPARIN SOD (PORK) LOCK FLUSH 100 UNIT/ML IV SOLN
500.0000 [IU] | Freq: Once | INTRAVENOUS | Status: DC
  Filled 2017-10-31: qty 5

## 2017-10-31 MED ORDER — MIDAZOLAM HCL 5 MG/5ML IJ SOLN
INTRAMUSCULAR | Status: AC | PRN
  Administered 2017-10-31 (×2): 1 mg via INTRAVENOUS

## 2017-10-31 NOTE — Procedures (Signed)
Retroperitoneal mass Bx 18 g times 6 EBL 0 Comp 0

## 2017-11-01 ENCOUNTER — Telehealth: Payer: Self-pay | Admitting: Internal Medicine

## 2017-11-01 NOTE — Telephone Encounter (Signed)
Left message for pt to discuss the results of the biopsy. Discussed with Dr.Baker will add CD-30.

## 2017-11-04 ENCOUNTER — Telehealth: Payer: Self-pay | Admitting: *Deleted

## 2017-11-04 ENCOUNTER — Telehealth: Payer: Self-pay | Admitting: Internal Medicine

## 2017-11-04 DIAGNOSIS — C8333 Diffuse large B-cell lymphoma, intra-abdominal lymph nodes: Secondary | ICD-10-CM

## 2017-11-04 NOTE — Telephone Encounter (Signed)
Called pt and told her about her appt. I'm so sorry didn't know you had already called her. She will be here in [place] at 8:45 am

## 2017-11-04 NOTE — Telephone Encounter (Signed)
Patient returning call to Dr Rogue Bussing from Friday.Please return her call [phone]

## 2017-11-04 NOTE — Addendum Note (Signed)
Addended by: Sabino Gasser on: 11/04/2017 04:55 PM   Modules accepted: Orders

## 2017-11-04 NOTE — Telephone Encounter (Signed)
Spoke to pt re: results of Bx; positive for lymphoma; recommend follow up in [place] clinic on 3/19; at [place] ; labs- [cbc/cmp/ldh- please order]; pt to arrive at 8:45 to clinic.  Please order.schedule above. Thx

## 2017-11-05 ENCOUNTER — Inpatient Hospital Stay (HOSPITAL_BASED_OUTPATIENT_CLINIC_OR_DEPARTMENT_OTHER): Payer: Medicare Other | Admitting: Internal Medicine

## 2017-11-05 ENCOUNTER — Inpatient Hospital Stay: Payer: Medicare Other

## 2017-11-05 ENCOUNTER — Other Ambulatory Visit: Payer: Self-pay

## 2017-11-05 VITALS — BP 169/62 | HR 63 | Temp 97.6°F | Resp 20 | Ht 67.0 in | Wt 153.2 lb

## 2017-11-05 DIAGNOSIS — Z87891 Personal history of nicotine dependence: Secondary | ICD-10-CM

## 2017-11-05 DIAGNOSIS — Z9221 Personal history of antineoplastic chemotherapy: Secondary | ICD-10-CM | POA: Diagnosis not present

## 2017-11-05 DIAGNOSIS — C8339 Diffuse large B-cell lymphoma, extranodal and solid organ sites: Secondary | ICD-10-CM

## 2017-11-05 DIAGNOSIS — I2510 Atherosclerotic heart disease of native coronary artery without angina pectoris: Secondary | ICD-10-CM

## 2017-11-05 DIAGNOSIS — J849 Interstitial pulmonary disease, unspecified: Secondary | ICD-10-CM | POA: Diagnosis not present

## 2017-11-05 DIAGNOSIS — E119 Type 2 diabetes mellitus without complications: Secondary | ICD-10-CM

## 2017-11-05 DIAGNOSIS — C8333 Diffuse large B-cell lymphoma, intra-abdominal lymph nodes: Secondary | ICD-10-CM

## 2017-11-05 DIAGNOSIS — M21379 Foot drop, unspecified foot: Secondary | ICD-10-CM | POA: Diagnosis not present

## 2017-11-05 DIAGNOSIS — E7800 Pure hypercholesterolemia, unspecified: Secondary | ICD-10-CM

## 2017-11-05 DIAGNOSIS — Z923 Personal history of irradiation: Secondary | ICD-10-CM | POA: Diagnosis not present

## 2017-11-05 DIAGNOSIS — Z7984 Long term (current) use of oral hypoglycemic drugs: Secondary | ICD-10-CM

## 2017-11-05 DIAGNOSIS — I10 Essential (primary) hypertension: Secondary | ICD-10-CM | POA: Diagnosis not present

## 2017-11-05 DIAGNOSIS — Z79899 Other long term (current) drug therapy: Secondary | ICD-10-CM

## 2017-11-05 DIAGNOSIS — T451X5S Adverse effect of antineoplastic and immunosuppressive drugs, sequela: Secondary | ICD-10-CM

## 2017-11-05 DIAGNOSIS — G620 Drug-induced polyneuropathy: Secondary | ICD-10-CM

## 2017-11-05 LAB — COMPREHENSIVE METABOLIC PANEL
ALBUMIN: 4.1 g/dL (ref 3.5–5.0)
ALT: 10 U/L — AB (ref 14–54)
AST: 17 U/L (ref 15–41)
Alkaline Phosphatase: 70 U/L (ref 38–126)
Anion gap: 9 (ref 5–15)
BUN: 18 mg/dL (ref 6–20)
CHLORIDE: 104 mmol/L (ref 101–111)
CO2: 28 mmol/L (ref 22–32)
CREATININE: 0.49 mg/dL (ref 0.44–1.00)
Calcium: 9.4 mg/dL (ref 8.9–10.3)
GFR calc Af Amer: >60 mL/min (ref >60)
GLUCOSE: 130 mg/dL — AB (ref 65–99)
POTASSIUM: 4.3 mmol/L (ref 3.5–5.1)
Sodium: 141 mmol/L (ref 135–145)
Total Bilirubin: 0.5 mg/dL (ref 0.3–1.2)
Total Protein: 7.1 g/dL (ref 6.5–8.1)

## 2017-11-05 LAB — CBC WITH DIFFERENTIAL/PLATELET
Basophils Absolute: 0 10*3/uL (ref 0–0.1)
Basophils Relative: 1 %
EOS ABS: 0.3 10*3/uL (ref 0–0.7)
EOS PCT: 7 %
HCT: 38.9 % (ref 35.0–47.0)
Hemoglobin: 13.1 g/dL (ref 12.0–16.0)
LYMPHS ABS: 0.9 10*3/uL — AB (ref 1.0–3.6)
LYMPHS PCT: 19 %
MCH: 29.3 pg (ref 26.0–34.0)
MCHC: 33.6 g/dL (ref 32.0–36.0)
MCV: 87.2 fL (ref 80.0–100.0)
MONO ABS: 0.6 10*3/uL (ref 0.2–0.9)
MONOS PCT: 13 %
Neutro Abs: 2.9 10*3/uL (ref 1.4–6.5)
Neutrophils Relative %: 60 %
PLATELETS: 246 10*3/uL (ref 150–440)
RBC: 4.46 MIL/uL (ref 3.80–5.20)
RDW: 13.8 % (ref 11.5–14.5)
WBC: 4.8 10*3/uL (ref 3.6–11.0)

## 2017-11-05 LAB — LACTATE DEHYDROGENASE: LDH: 177 U/L (ref 98–192)

## 2017-11-05 NOTE — Assessment & Plan Note (Addendum)
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  Unfortunately, March 2019- PET scan-~  4 cm soft tissue mass noted inferior to the left kidney SUV 15 highly concerning for recurrence.  Biopsy of the mass has confirmed recurrence of the large cell lymphoma.  #I had a long discussion with the patient/and family regarding the serious prognosis given the recurrence.   #In general-high-dose chemotherapy [R-GDP/R-ICE would be recommended]-and if a good response autologous stem cell transplant would be recommended.  Even with this approach-cure rate are 30-50%.   # I also reviewed the data of CART therapy; response rates ranging 60-80%; and durable responses of >  2 years if patient's had complete responses.  Also discussed the risk of cytokine release syndrome-which could cause drop in blood pressures/also CNS/obtundation etc.   # However I would defer Dr. Thera Flake for further recommendations-regarding the above 2 approaches.  #Discussed that with the patient-if she chooses "nonaggressive options"-the options include R-Gem-Ox; Rituxan-Len; others.  Response rates-40-50%; however these options would be palliative/not curative.   #Also discussed with the patient-if she did not want any therapy/the life expectancy would be in order of few months.  Patient extremely worried about the potential side effects of treatment.  However, I would at least recommend some therapy-as she is currently has a good performance status/organ function.   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 Significant improvement.  Of Neurontin.  #Low-grade fever/ear pain- resolved s/p Augumentin.   # follow up TBD.I have spoken to [place] regarding the patient; she kindly agreed to evaluate the patient.  Patient has appointment with Dr. Thera Flake tomorrow afternoon.  I have asked the patient to inform us of her  decision so that we can plan treatment ASAP.   # 40 minutes face-to-face with the patient discussing the above plan of care; more than 50% of time spent on prognosis/ natural history; counseling and coordination.   Cc: Dr.Miller

## 2017-11-05 NOTE — Progress Notes (Signed)
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  female with above history of diffuse large B-cell lymphoma [triple hit] currently on DA- [facility] s/p 6 cycles; followed by consolidation radiation finished in July 2018 is here to review the results of the retroperitoneal biopsy/given the abdominal PET scan.   Patient symptoms of URI-left ear ache coughing/nasal congestion is improved since being on antibiotic.   No unusual cough or chest pain or shortness of breath.  General appetite is good.  Denies any abdominal pain nausea vomiting. Her neuropathy has significantly improved; continues to have intermittent tingling and numbness.  She is today accompanied by her son and husband.  ROS: A complete 10 point review of system is done which is negative except mentioned above in history of present illness  MEDICAL HISTORY:   Past Medical History:  Diagnosis Date  . Diabetes mellitus without complication ([place])   . Heart murmur   . History of chemotherapy 07/08/2017  . History of gastric ulcer   . History of radiation therapy 07/08/2017  . Hypercholesterolemia   . Hypertension   . ILD (interstitial lung disease) ([place])    8 yrs ago  . Lymphadenopathy     SURGICAL HISTORY: Past Surgical History:  Procedure Laterality Date  . BREAST BIOPSY Left 2010   neg  . CATARACT EXTRACTION W/PHACO Left 10/16/2017   Procedure: CATARACT EXTRACTION PHACO AND INTRAOCULAR LENS PLACEMENT ([place]) COMPLICATED DIABETIC LEFT;  Surgeon: Leandrew Koyanagi, MD;  Location: [place];  Service: Ophthalmology;  Laterality: Left;  IRIS HOOKS Diabetic - oral meds  . HERNIA REPAIR    . PARTIAL HYSTERECTOMY     age 81  . PERIPHERAL VASCULAR CATHETERIZATION N/A 08/22/2016   Procedure: [name];  Surgeon: [name], MD;  Location: [place] CV LAB;  Service: Cardiovascular;  Laterality: N/A;  . TONSILLECTOMY      SOCIAL HISTORY: quit smoking 40 years ago; human resources retd; in [place]; no alcohol Social History   Socioeconomic History  . Marital status: Married    Spouse name: Nelissa Bolduc  . Number of children: 2  . Years of education: Not on file  . Highest education level: Not on file  Social Needs  . Financial resource strain: Not on file  . Food insecurity - worry: Not  on file  . Food insecurity - inability: Not on file  . Transportation needs - medical: Not on file  . Transportation needs - non-medical: Not on file  Occupational History  . Occupation: Retired  Tobacco Use  . Smoking status: Former Smoker    Last attempt to quit: 08/21/1958    Years since quitting: 59.2  . Smokeless tobacco: Never Used  Substance and Sexual Activity  . Alcohol use: No  . Drug use: No  . Sexual activity: No  Other Topics Concern  . Not on file  Social History Narrative   Not applicable at  this time    FAMILY HISTORY: Family History  Problem Relation Age of Onset  . Heart disease Mother   . Heart disease Father   . Heart disease Brother     ALLERGIES:  is allergic to levaquin [levofloxacin in d5w]; cefuroxime axetil; and doxycycline.  MEDICATIONS:  Current Outpatient Medications  Medication Sig Dispense Refill  . b complex vitamins tablet Take 1 tablet by mouth daily.    Marland Kitchen gabapentin (NEURONTIN) 300 MG capsule Take 1 capsule (300 mg total) by mouth 2 (two) times daily. 180 capsule 4  . latanoprost (XALATAN) 0.005 % ophthalmic solution Place 1 drop into both eyes at bedtime.     . lidocaine-prilocaine (EMLA) cream Apply cream 1 hour before chemotherapy treatment, place small amount of saran wrap over cream to protect clothing. 30 g 1  . Magnesium 400 MG TABS Take 1 tablet by mouth daily.    . metFORMIN (GLUCOPHAGE) 500 MG tablet Take 250 mg by mouth daily with breakfast.     . metoprolol tartrate (LOPRESSOR) 25 MG tablet Take 1 tablet (25 mg total) by mouth 2 (two) times daily. 180 tablet 4  . pravastatin (PRAVACHOL) 40 MG tablet Take 1 tablet by mouth daily.    . sucralfate (CARAFATE) 1 g tablet Take 1/2 tablet 2 times daily. 30 tablet 6  . ondansetron (ZOFRAN) 8 MG tablet TAKE 1 TABLET BY MOUTH EVERY 8 HOURS AS NEEDED FOR NAUSEA AND VOMITING (TAKE TWICE DAILY FOR THE FIRST 3 DAYS AFTER CHEMOTHERAPY) (Patient not taking: Reported on 10/10/2017) 30 tablet 0  . prochlorperazine (COMPAZINE) 10 MG tablet Take 1 tablet (10 mg total) by mouth every 6 (six) hours as needed for nausea, vomiting or refractory nausea / vomiting. (Patient not taking: Reported on 10/10/2017) 30 tablet 0   No current facility-administered medications for this visit.    Facility-Administered Medications Ordered in Other Visits  Medication Dose Route Frequency Provider Last Rate Last Dose  . 0.9 %  sodium chloride infusion   Intravenous Continuous Cammie Sickle, MD   Stopped at 11/05/16 1217  .  0.9 %  sodium chloride infusion   Intravenous Continuous Cammie Sickle, MD   Stopped at 12/03/16 1345  . 0.9 %  sodium chloride infusion   Intravenous Continuous Cammie Sickle, MD 10 mL/hr at 12/05/16 1030    . 0.9 %  sodium chloride infusion   Intravenous Continuous Cammie Sickle, MD   Stopped at 12/24/16 1256  . 0.9 %  sodium chloride infusion   Intravenous Continuous Cammie Sickle, MD   Stopped at 12/26/16 1011  . 0.9 %  sodium chloride infusion   Intravenous Continuous Charlaine Dalton R, MD 10 mL/hr at 12/27/16 0930    . heparin lock flush 100 unit/mL  500 Units Intravenous Once Charlaine Dalton R, MD      . heparin lock flush 100 unit/mL  500 Units Intracatheter Once PRN Charlaine Dalton R, MD      . heparin lock flush 100 unit/mL  500 Units Intravenous Once Charlaine Dalton R, MD      . sodium chloride flush (NS) 0.9 % injection 10 mL  10 mL Intravenous PRN Cammie Sickle, MD   10 mL at 11/06/16 1000  . sodium chloride flush (NS) 0.9 % injection 10 mL  10 mL Intravenous PRN Cammie Sickle, MD   10 mL at 11/07/16 0905  . sodium chloride flush (NS) 0.9 % injection 10 mL  10 mL Intravenous PRN Charlaine Dalton R, MD      . sodium chloride flush (NS) 0.9 % injection 10 mL  10 mL Intracatheter PRN Charlaine Dalton R, MD      . sodium chloride flush (NS) 0.9 % injection 10 mL  10 mL Intravenous PRN Cammie Sickle, MD   10 mL at 12/05/16 1030  . sodium chloride flush (NS) 0.9 % injection 10 mL  10 mL Intravenous PRN Cammie Sickle, MD   10 mL at 12/27/16 0930      .  PHYSICAL EXAMINATION: ECOG PERFORMANCE STATUS: 1 - Symptomatic but completely ambulatory  Vitals:   11/05/17 0848  BP: (!) 169/62  Pulse: 63  Resp: 20  Temp: 97.6 F (36.4 C)   Filed Weights   11/05/17 0848  Weight: 153 lb 3.5 oz (69.5 kg)    GENERAL: Well-nourished well-developed; Alert, no distress and comfortable.   With her  husband/son. EYES: no pallor or icterus;  OROPHARYNX: no thrush or ulceration; good dentition  NECK: supple, no masses felt LYMPH:  no palpable lymphadenopathy in the cervical, axillary or inguinal regions LUNGS: Positive for crackles at the bases auscultation and  No wheeze.  HEART/CVS: regular rate & rhythm and no murmurs; No lower extremity edema ABDOMEN: abdomen soft, no tenderness; normal bowel sounds.  PSYCH: alert & oriented x 3 with fluent speech NEURO: no focal motor/sensory deficits. Resolved. SKIN:  no rashes or significant lesions  LABORATORY DATA:  I have reviewed the data as listed Lab Results  Component Value Date   WBC 4.8 11/05/2017   HGB 13.1 11/05/2017   HCT 38.9 11/05/2017   MCV 87.2 11/05/2017   PLT 246 11/05/2017   Recent Labs    07/10/17 1101 10/23/17 1316 11/05/17 0837  NA 138 139 141  K 4.0 3.8 4.3  CL 104 106 104  CO2 26 26 28   GLUCOSE 100* 99 130*  BUN 19 10 18   CREATININE 0.67 0.32* 0.49  CALCIUM 8.9 8.9 9.4  GFRNONAA >60 >60 >60  GFRAA >60 >60 >60  PROT 6.5 6.8 7.1  ALBUMIN 3.7 3.8 4.1  AST 21 14* 17  ALT 12* 9* 10*  ALKPHOS 54 68 70  BILITOT 0.4 0.5 0.5    RADIOGRAPHIC STUDIES: I have personally reviewed the radiological images as listed and agreed with the findings in the report. Nm Pet Image Restag (ps) Skull Base To Thigh  Result Date: 10/22/2017 CLINICAL DATA:  Subsequent treatment strategy for lymphoma. EXAM: NUCLEAR MEDICINE PET SKULL BASE TO THIGH TECHNIQUE: 8.29 mCi F-18 FDG was injected intravenously. Full-ring PET imaging was performed from the skull base to thigh after the radiotracer. CT data was obtained and used for attenuation correction and anatomic localization. Fasting blood glucose: 107 mg/dl Mediastinal blood pool activity: SUV max 2.2 COMPARISON:  PET-CT dated 07/08/2017 FINDINGS: NECK: No hypermetabolic cervical lymphadenopathy. Incidental CT findings: none CHEST: No hypermetabolic pulmonary  nodules. No hypermetabolic  thoracic lymphadenopathy. Incidental CT findings: Subpleural reticulation/fibrosis in the lungs bilaterally, grossly unchanged. Mild cardiomegaly. No pericardial effusion. No evidence of thoracic aortic aneurysm. Mild atherosclerotic calcifications of the aortic arch. Mild three-vessel coronary atherosclerosis. Right chest port terminating at the cavoatrial junction. ABDOMEN/PELVIS: Mild residual nodal soft tissue in the left para-aortic region measuring up to 9 mm short axis (series 3/image 150), max SUV 2.5, previously 10 mm with max SUV 2.2. New 3.7 x 4.3 cm soft tissue mass inferior to the left kidney, in the left perirenal space (series 3/image 164), max SUV 15.5. Although technically indeterminate, this appearance is considered worrisome for recurrent lymphoma. Surrounding soft tissue stranding. Otherwise, no hypermetabolic abdominopelvic lymphadenopathy. No abnormal metabolism involving the liver, spleen, pancreas, or adrenal glands. Incidental CT findings: Moderate hiatal hernia. Mild atherosclerotic calcifications the abdominal aorta and branch vessels. Sigmoid diverticulosis, without evidence of diverticulitis. Prior hysterectomy. SKELETON: No focal hypermetabolic activity to suggest skeletal metastasis. Incidental CT findings: none IMPRESSION: New hypermetabolic 4.3 cm soft tissue mass inferior to the left kidney, worrisome for recurrent lymphoma. [place] 5. Mild residual nodal soft tissue in the left para-aortic region, favoring treated lymphoma, stable versus mildly decreased. Additional stable ancillary findings as above. Electronically Signed   By: Julian Hy M.D.   On: 10/22/2017 12:52   Ct Biopsy  Result Date: 10/31/2017 INDICATION: Left retroperitoneal lymph node EXAM: CT BIOPSY MEDICATIONS: None. ANESTHESIA/SEDATION: Fentanyl 50 mcg IV; Versed 2 mg IV Moderate Sedation Time:  11 The patient was continuously monitored during the procedure by the interventional radiology nurse under my  direct supervision. FLUOROSCOPY TIME:  Fluoroscopy Time:  minutes  seconds ( mGy). COMPLICATIONS: None immediate. PROCEDURE: Informed written consent was obtained from the patient after a thorough discussion of the procedural risks, benefits and alternatives. All questions were addressed. Maximal Sterile Barrier Technique was utilized including caps, mask, sterile gowns, sterile gloves, sterile drape, hand hygiene and skin antiseptic. A timeout was performed prior to the initiation of the procedure. Under CT guidance, a(n) 17 gauge guide needle was advanced into the left retroperitoneal lymph node. Subsequently 6 18 gauge core biopsies were obtained. The guide needle was removed. Post biopsy images demonstrate no hemorrhage. Patient tolerated the procedure well without complication. Vital sign monitoring by nursing staff during the procedure will continue as patient is in the special procedures unit for post procedure observation. FINDINGS: The images document guide needle placement within the retroperitoneal lymph node. Post biopsy images demonstrate no hemorrhage. IMPRESSION: Successful CT-guided core biopsy of a left retroperitoneal lymph Electronically Signed   By: Marybelle Killings M.D.   On: 10/31/2017 16:08  IMPRESSION: New hypermetabolic 4.3 cm soft tissue mass inferior to the left kidney, worrisome for recurrent lymphoma. Deauville 5.  Mild residual nodal soft tissue in the left para-aortic region, favoring treated lymphoma, stable versus mildly decreased.  Additional stable ancillary findings as above.   Electronically Signed   By: Julian Hy M.D.   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  Unfortunately, March 2019- PET scan-~  4 cm soft tissue mass noted inferior to the  left kidney SUV 15 highly concerning for recurrence.  Biopsy of the mass has confirmed recurrence of the large cell lymphoma.  #I had a long discussion with the patient/and family regarding the serious prognosis given  the recurrence.   #In general-high-dose chemotherapy [R-GDP/R-ICE would be recommended]-and if a good response autologous stem cell transplant would be recommended.  Even with this approach-cure rate are 30-50%.   # I also reviewed the data of CART therapy; response rates ranging 60-80%; and durable responses of >  2 years if patient's had complete responses.  Also discussed the risk of cytokine release syndrome-which could cause drop in blood pressures/also CNS/obtundation etc.   # However I would defer Dr. Thera Flake for further recommendations-regarding the above 2 approaches.  #Discussed that with the patient-if she chooses "nonaggressive options"-the options include R-Gem-Ox; Rituxan-Len; others.  Response rates-40-50%; however these options would be palliative/not curative.   #Also discussed with the patient-if she did not want any therapy/the life expectancy would be in order of few months.  Patient extremely worried about the potential side effects of treatment.  However, I would at least recommend some therapy-as she is currently has a good performance status/organ function.   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 Significant improvement.  Of Neurontin.  #Low-grade fever/ear pain- resolved s/p Augumentin.   # follow up TBD.I have spoken to Sharon regarding the patient; she kindly agreed to evaluate the patient.  Patient has appointment with Dr. Thera Flake tomorrow afternoon.  I have asked the patient to inform us of her decision so that we can plan treatment ASAP.   # 40 minutes face-to-face with the patient discussing the above plan of care; more than 50% of time spent on prognosis/ natural history; counseling and coordination.   Cc:  Dr.Miller  All questions were answered. The patient knows to call the clinic with any problems, questions or concerns.    Cammie Sickle, MD 11/05/2017 11:22 AM

## 2017-11-06 DIAGNOSIS — C8338 Diffuse large B-cell lymphoma, lymph nodes of multiple sites: Secondary | ICD-10-CM | POA: Insufficient documentation

## 2017-11-11 ENCOUNTER — Telehealth: Payer: Self-pay | Admitting: Internal Medicine

## 2017-11-11 DIAGNOSIS — T451X5A Adverse effect of antineoplastic and immunosuppressive drugs, initial encounter: Principal | ICD-10-CM

## 2017-11-11 DIAGNOSIS — R110 Nausea: Secondary | ICD-10-CM

## 2017-11-11 DIAGNOSIS — C8333 Diffuse large B-cell lymphoma, intra-abdominal lymph nodes: Secondary | ICD-10-CM

## 2017-11-11 LAB — SURGICAL PATHOLOGY

## 2017-11-11 MED ORDER — ONDANSETRON HCL 8 MG PO TABS: ORAL_TABLET | ORAL | 1 refills | Status: DC

## 2017-11-11 MED ORDER — PROCHLORPERAZINE MALEATE 10 MG PO TABS: 10.0000 mg | ORAL_TABLET | Freq: Four times a day (QID) | ORAL | 0 refills | Status: DC | PRN

## 2017-11-11 MED ORDER — DEXAMETHASONE 4 MG PO TABS: ORAL_TABLET | ORAL | 4 refills | Status: DC

## 2017-11-11 NOTE — Telephone Encounter (Signed)
Colette, please schedule chemotherapy as planned. I spoke with Maudie Mercury in infusion. The tx on 4/10 needs to be scheduled for 6.5 hours.

## 2017-11-11 NOTE — Telephone Encounter (Signed)
Reviewed the note from [place] to pt-interested in proceeding with chemotherapy; agrees to start - R-GD-carboplatin;  Will prescribe - dex; anti-emetics sent to pharmacy-   [facility]-  # please schedule the patient for gemcitabine-carboplatin [ 3 hours] on 4/3; and rituxan- gem [3 hours] on 4/10.  # Please also schedule the patient see me on April 3/labs CBC BMP-treatment as above.

## 2017-11-11 NOTE — Addendum Note (Signed)
Addended by: Sabino Gasser on: 11/11/2017 03:43 PM   Modules accepted: Orders

## 2017-11-20 ENCOUNTER — Inpatient Hospital Stay: Payer: Medicare Other | Admitting: Internal Medicine

## 2017-11-20 ENCOUNTER — Inpatient Hospital Stay: Payer: Medicare Other | Attending: Internal Medicine

## 2017-11-20 ENCOUNTER — Encounter: Payer: Self-pay | Admitting: Internal Medicine

## 2017-11-20 ENCOUNTER — Inpatient Hospital Stay: Payer: Medicare Other

## 2017-11-20 VITALS — BP 109/68 | HR 75 | Temp 98.2°F | Resp 16 | Wt 157.4 lb

## 2017-11-20 DIAGNOSIS — I10 Essential (primary) hypertension: Secondary | ICD-10-CM

## 2017-11-20 DIAGNOSIS — Z79899 Other long term (current) drug therapy: Secondary | ICD-10-CM | POA: Diagnosis not present

## 2017-11-20 DIAGNOSIS — Z87891 Personal history of nicotine dependence: Secondary | ICD-10-CM | POA: Diagnosis not present

## 2017-11-20 DIAGNOSIS — J849 Interstitial pulmonary disease, unspecified: Secondary | ICD-10-CM

## 2017-11-20 DIAGNOSIS — Z923 Personal history of irradiation: Secondary | ICD-10-CM

## 2017-11-20 DIAGNOSIS — G629 Polyneuropathy, unspecified: Secondary | ICD-10-CM | POA: Insufficient documentation

## 2017-11-20 DIAGNOSIS — M21379 Foot drop, unspecified foot: Secondary | ICD-10-CM | POA: Diagnosis not present

## 2017-11-20 DIAGNOSIS — E119 Type 2 diabetes mellitus without complications: Secondary | ICD-10-CM | POA: Insufficient documentation

## 2017-11-20 DIAGNOSIS — Z7984 Long term (current) use of oral hypoglycemic drugs: Secondary | ICD-10-CM

## 2017-11-20 DIAGNOSIS — C8333 Diffuse large B-cell lymphoma, intra-abdominal lymph nodes: Secondary | ICD-10-CM | POA: Diagnosis present

## 2017-11-20 DIAGNOSIS — E7800 Pure hypercholesterolemia, unspecified: Secondary | ICD-10-CM

## 2017-11-20 DIAGNOSIS — Z5112 Encounter for antineoplastic immunotherapy: Secondary | ICD-10-CM | POA: Insufficient documentation

## 2017-11-20 DIAGNOSIS — R6884 Jaw pain: Secondary | ICD-10-CM | POA: Insufficient documentation

## 2017-11-20 DIAGNOSIS — H9202 Otalgia, left ear: Secondary | ICD-10-CM | POA: Diagnosis not present

## 2017-11-20 DIAGNOSIS — Z5111 Encounter for antineoplastic chemotherapy: Secondary | ICD-10-CM | POA: Diagnosis not present

## 2017-11-20 LAB — BASIC METABOLIC PANEL
Anion gap: 8 (ref 5–15)
BUN: 14 mg/dL (ref 6–20)
CO2: 26 mmol/L (ref 22–32)
CREATININE: 0.47 mg/dL (ref 0.44–1.00)
Calcium: 8.8 mg/dL — ABNORMAL LOW (ref 8.9–10.3)
Chloride: 107 mmol/L (ref 101–111)
GFR calc Af Amer: >60 mL/min (ref >60)
GFR calc non Af Amer: >60 mL/min (ref >60)
GLUCOSE: 121 mg/dL — AB (ref 65–99)
POTASSIUM: 4.1 mmol/L (ref 3.5–5.1)
Sodium: 141 mmol/L (ref 135–145)

## 2017-11-20 LAB — CBC WITH DIFFERENTIAL/PLATELET
Basophils Absolute: 0.1 10*3/uL (ref 0–0.1)
Basophils Relative: 1 %
EOS ABS: 0.3 10*3/uL (ref 0–0.7)
EOS PCT: 7 %
HCT: 35.6 % (ref 35.0–47.0)
Hemoglobin: 12 g/dL (ref 12.0–16.0)
LYMPHS ABS: 0.8 10*3/uL — AB (ref 1.0–3.6)
LYMPHS PCT: 15 %
MCH: 29.5 pg (ref 26.0–34.0)
MCHC: 33.6 g/dL (ref 32.0–36.0)
MCV: 87.7 fL (ref 80.0–100.0)
MONOS PCT: 12 %
Monocytes Absolute: 0.6 10*3/uL (ref 0.2–0.9)
Neutro Abs: 3.4 10*3/uL (ref 1.4–6.5)
Neutrophils Relative %: 65 %
PLATELETS: 217 10*3/uL (ref 150–440)
RBC: 4.06 MIL/uL (ref 3.80–5.20)
RDW: 13.9 % (ref 11.5–14.5)
WBC: 5.2 10*3/uL (ref 3.6–11.0)

## 2017-11-20 MED ORDER — HEPARIN SOD (PORK) LOCK FLUSH 100 UNIT/ML IV SOLN
500.0000 [IU] | Freq: Once | INTRAVENOUS | Status: AC | PRN
  Administered 2017-11-20: 500 [IU]
  Filled 2017-11-20: qty 5

## 2017-11-20 MED ORDER — CARBOPLATIN CHEMO INJECTION 600 MG/60ML
412.0000 mg | Freq: Once | INTRAVENOUS | Status: AC
  Administered 2017-11-20: 410 mg via INTRAVENOUS
  Filled 2017-11-20: qty 41

## 2017-11-20 MED ORDER — SODIUM CHLORIDE 0.9 % IV SOLN
1800.0000 mg | Freq: Once | INTRAVENOUS | Status: AC
  Administered 2017-11-20: 1800 mg via INTRAVENOUS
  Filled 2017-11-20: qty 26.3

## 2017-11-20 MED ORDER — SODIUM CHLORIDE 0.9 % IV SOLN
Freq: Once | INTRAVENOUS | Status: AC
  Administered 2017-11-20: 10:00:00 via INTRAVENOUS
  Filled 2017-11-20: qty 1000

## 2017-11-20 MED ORDER — FOSAPREPITANT DIMEGLUMINE INJECTION 150 MG
Freq: Once | INTRAVENOUS | Status: AC
  Administered 2017-11-20: 11:00:00 via INTRAVENOUS
  Filled 2017-11-20: qty 5

## 2017-11-20 MED ORDER — PALONOSETRON HCL INJECTION 0.25 MG/5ML
0.2500 mg | Freq: Once | INTRAVENOUS | Status: AC
  Administered 2017-11-20: 0.25 mg via INTRAVENOUS

## 2017-11-20 MED ORDER — SODIUM CHLORIDE 0.9% FLUSH
10.0000 mL | INTRAVENOUS | Status: DC | PRN
  Filled 2017-11-20: qty 10

## 2017-11-20 NOTE — Progress Notes (Signed)
[place] NOTE  Patient Care Team: Rusty Aus, [name]  CHIEF COMPLAINTS/PURPOSE OF CONSULTATION: Abdominal mass  Oncology History   DEC 2017- DLBCL "TRIPLE HIT [myc/ bcl-2/bcl-6 gene rearrangement FISH]" ~10 cm mass RP LN; STAGE II [jan 2018- BMBx-NEG]; Jan 8th R-[facility]OP;   # JAN 29th 2018- DA-R EPOCH x5 ; PET CR; s/p RT [last 03/18/2017]  # FEB-MARCH 2019- RECURRENCE of DLBCL [s/p RP Bx; 4 cm mass inferior to Left Kidney] ; II [facility]; Dr.Grover.   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)     HISTORY OF PRESENTING ILLNESS:  [place] 72 y.o.  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  In the interim patient has been evaluated at [facility] for second opinion.   Currently patient denies any unusual nausea vomiting.  Denies any chest pain or shortness of breath cough.  Patient denies any headaches.  Denies any tingling or numbness.  Her appetite is good.  Denies any night sweats or lumps or bumps.  ROS: A complete 10 point review of system is done which is negative except mentioned above in history of present illness  MEDICAL HISTORY:  Past Medical History:  Diagnosis Date  .  Diabetes mellitus without complication ([place])   . Heart murmur   . History of chemotherapy 07/08/2017  . History of gastric ulcer   . History of radiation therapy 07/08/2017  . Hypercholesterolemia   . Hypertension   . ILD (interstitial lung disease) ([place])    8 yrs ago  . Lymphadenopathy     SURGICAL HISTORY: Past Surgical History:  Procedure Laterality Date  . BREAST BIOPSY Left 2010   neg  . CATARACT EXTRACTION W/PHACO Left 10/16/2017   Procedure: CATARACT EXTRACTION PHACO AND INTRAOCULAR LENS PLACEMENT ([place]) COMPLICATED DIABETIC LEFT;  Surgeon: Leandrew Koyanagi, MD;  Location: [place];  Service: Ophthalmology;  Laterality: Left;  IRIS HOOKS Diabetic - oral meds  . HERNIA REPAIR    . PARTIAL HYSTERECTOMY     age 72  . PERIPHERAL VASCULAR CATHETERIZATION N/A 08/22/2016   Procedure: [name];  Surgeon: [name], MD;  Location: [facility];  Service: Cardiovascular;  Laterality: N/A;  . TONSILLECTOMY      SOCIAL HISTORY: quit smoking 40 years ago; human resources retd; in [place]; no alcohol Social History   Socioeconomic History  . Marital status: Married    Spouse name: Ori Kreiter  . Number of children: 2  . Years of education: Not on file  . Highest education level: Not on file  Occupational History  . Occupation: Retired  Scientific laboratory technician  . Emergency planning/management officer  strain: Not on file  . Food insecurity:    Worry: Not on file    Inability: Not on file  . Transportation needs:    Medical: Not on file    Non-medical: Not on file  Tobacco Use  . Smoking status: Former Smoker    Last attempt to quit: 08/21/1958    Years since quitting: 59.3  . Smokeless tobacco: Never Used  Substance and Sexual Activity  . Alcohol use: No  . Drug use: No  . Sexual activity: Never  Lifestyle  . Physical activity:    Days per week: Not on file    Minutes per session: Not on file  . Stress: Not on file  Relationships  . Social connections:     Talks on phone: Not on file    Gets together: Not on file    Attends religious service: Not on file    Active member of club or organization: Not on file    Attends meetings of clubs or organizations: Not on file    Relationship status: Not on file  . Intimate partner violence:    Fear of current or ex partner: Not on file    Emotionally abused: Not on file    Physically abused: Not on file    Forced sexual activity: Not on file  Other Topics Concern  . Not on file  Social History Narrative   Not applicable at this time    FAMILY HISTORY: Family History  Problem Relation Age of Onset  . Heart disease Mother   . Heart disease Father   . Heart disease Brother     ALLERGIES:  is allergic to levaquin [levofloxacin in d5w]; cefuroxime axetil; and doxycycline.  MEDICATIONS:  Current Outpatient Medications  Medication Sig Dispense Refill  . b complex vitamins tablet Take 1 tablet by mouth daily.    Marland Kitchen gabapentin (NEURONTIN) 300 MG capsule Take 1 capsule (300 mg total) by mouth 2 (two) times daily. 180 capsule 4  . latanoprost (XALATAN) 0.005 % ophthalmic solution Place 1 drop into both eyes at bedtime.     . lidocaine-prilocaine (EMLA) cream Apply cream 1 hour before chemotherapy treatment, place small amount of saran wrap over cream to protect clothing. 30 g 1  . Magnesium 400 MG TABS Take 1 tablet by mouth daily.    . metFORMIN (GLUCOPHAGE) 500 MG tablet Take 250 mg by mouth daily with breakfast.     . metoprolol tartrate (LOPRESSOR) 25 MG tablet Take 1 tablet (25 mg total) by mouth 2 (two) times daily. 180 tablet 4  . pravastatin (PRAVACHOL) 40 MG tablet Take 1 tablet by mouth daily.    . sucralfate (CARAFATE) 1 g tablet Take 1/2 tablet 2 times daily. 30 tablet 6  . dexamethasone (DECADRON) 4 MG tablet Take 10 pills a day x 4 days; start the day after chemo; EVERY 3 WEEKS. (Patient not taking: Reported on 11/20/2017) 40 tablet 4  . ondansetron (ZOFRAN) 8 MG tablet One pill every  8 hours as needed for nausea/vomitting. (Patient not taking: Reported on 11/20/2017) 40 tablet 1  . prochlorperazine (COMPAZINE) 10 MG tablet Take 1 tablet (10 mg total) by mouth every 6 (six) hours as needed for nausea, vomiting or refractory nausea / vomiting. (Patient not taking: Reported on 11/20/2017) 40 tablet 0   No current facility-administered medications for this visit.    Facility-Administered Medications Ordered in Other Visits  Medication Dose Route Frequency Provider Last Rate Last Dose  . 0.9 %  sodium chloride infusion   Intravenous Continuous Cammie Sickle, MD 10 mL/hr at 12/05/16 1030    . 0.9 %  sodium chloride infusion   Intravenous Continuous Cammie Sickle, MD   Stopped at 12/26/16 1011  . 0.9 %  sodium chloride infusion   Intravenous Continuous Charlaine Dalton R, MD 10 mL/hr at 12/27/16 0930    . heparin lock flush 100 unit/mL  500 Units Intravenous Once Charlaine Dalton R, MD      . heparin lock flush 100 unit/mL  500 Units Intravenous Once Charlaine Dalton R, MD      . sodium chloride flush (NS) 0.9 % injection 10 mL  10 mL Intravenous PRN Cammie Sickle, MD   10 mL at 11/06/16 1000  . sodium chloride flush (NS) 0.9 % injection 10 mL  10 mL Intravenous PRN Cammie Sickle, MD   10 mL at 11/07/16 0905  . sodium chloride flush (NS) 0.9 % injection 10 mL  10 mL Intravenous PRN Charlaine Dalton R, MD      . sodium chloride flush (NS) 0.9 % injection 10 mL  10 mL Intravenous PRN Cammie Sickle, MD   10 mL at 12/05/16 1030  . sodium chloride flush (NS) 0.9 % injection 10 mL  10 mL Intravenous PRN Cammie Sickle, MD   10 mL at 12/27/16 0930      .  PHYSICAL EXAMINATION: ECOG PERFORMANCE STATUS: 1 - Symptomatic but completely ambulatory  Vitals:   11/20/17 0933  BP: 109/68  Pulse: 75  Resp: 16  Temp: 98.2 F (36.8 C)   Filed Weights   11/20/17 0933  Weight: 157 lb 6.4 oz (71.4 kg)    GENERAL: Well-nourished  well-developed; Alert, no distress and comfortable.   With her husband.  EYES: no pallor or icterus;  OROPHARYNX: no thrush or ulceration; good dentition  NECK: supple, no masses felt LYMPH:  no palpable lymphadenopathy in the cervical, axillary or inguinal regions LUNGS: Positive for crackles at the bases auscultation and  No wheeze.  HEART/CVS: regular rate & rhythm and no murmurs; No lower extremity edema ABDOMEN: abdomen soft, no tenderness; normal bowel sounds.  PSYCH: alert & oriented x 3 with fluent speech NEURO: no focal motor/sensory deficits. Resolved. SKIN:  no rashes or significant lesions  LABORATORY DATA:  I have reviewed the data as listed Lab Results  Component Value Date   WBC 5.2 11/20/2017   HGB 12.0 11/20/2017   HCT 35.6 11/20/2017   MCV 87.7 11/20/2017   PLT 217 11/20/2017   Recent Labs    07/10/17 1101 10/23/17 1316 11/05/17 0837 11/20/17 0906  NA 138 139 141 141  K 4.0 3.8 4.3 4.1  CL 104 106 104 107  CO2 26 26 28 26   GLUCOSE 100* 99 130* 121*  BUN 19 10 18 14   CREATININE 0.67 0.32* 0.49 0.47  CALCIUM 8.9 8.9 9.4 8.8*  GFRNONAA >60 >60 >60 >60  GFRAA >60 >60 >60 >60  PROT 6.5 6.8 7.1  --   ALBUMIN 3.7 3.8 4.1  --   AST 21 14* 17  --   ALT 12* 9* 10*  --   ALKPHOS 54 68 70  --   BILITOT 0.4 0.5 0.5  --     RADIOGRAPHIC STUDIES: I have personally reviewed the radiological images as listed and agreed with the findings in the report. Ct Biopsy  Result Date: 10/31/2017 INDICATION: Left retroperitoneal lymph node EXAM: CT BIOPSY MEDICATIONS: None. ANESTHESIA/SEDATION: Fentanyl  50 mcg IV; Versed 2 mg IV Moderate Sedation Time:  11 The patient was continuously monitored during the procedure by the interventional radiology nurse under my direct supervision. FLUOROSCOPY TIME:  Fluoroscopy Time:  minutes  seconds ( mGy). COMPLICATIONS: None immediate. PROCEDURE: Informed written consent was obtained from the patient after a thorough discussion of the  procedural risks, benefits and alternatives. All questions were addressed. Maximal Sterile Barrier Technique was utilized including caps, mask, sterile gowns, sterile gloves, sterile drape, hand hygiene and skin antiseptic. A timeout was performed prior to the initiation of the procedure. Under CT guidance, a(n) 17 gauge guide needle was advanced into the left retroperitoneal lymph node. Subsequently 6 18 gauge core biopsies were obtained. The guide needle was removed. Post biopsy images demonstrate no hemorrhage. Patient tolerated the procedure well without complication. Vital sign monitoring by nursing staff during the procedure will continue as patient is in the special procedures unit for post procedure observation. FINDINGS: The images document guide needle placement within the retroperitoneal lymph node. Post biopsy images demonstrate no hemorrhage. IMPRESSION: Successful CT-guided core biopsy of a left retroperitoneal lymph Electronically Signed   By: Marybelle Killings M.D.   On: 10/31/2017 16:08  IMPRESSION: New hypermetabolic 4.3 cm soft tissue mass inferior to the left kidney, worrisome for recurrent lymphoma. [place] 5.  Mild residual nodal soft tissue in the left para-aortic region, favoring treated lymphoma, stable versus mildly decreased.  Additional stable ancillary findings as above.   Electronically Signed   By: Julian Hy M.D.   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  # Proceed with cycle #1 carbo-Gem; today; Labs today reviewed;  acceptable for treatment today.   #Discussed overall plan that I would recommend a restaging scan after approximately 2 cycles; depending upon  response/plan for stem cell transplant/tolerance-total number of cycles ranged between 4-6.  Discussed for now the goal of treatment is cure-even though its fairly low.   #Patient is awaiting repeat evaluation at [facility]; bone marrow transplant team for possible consideration of autologous stem cell transplant.  Patient interested in car T-cell therapy also.  #Again reviewed the potential side effects-at length with the patient and family.   #Peripheral neuropathy/foot drop-currently improved.  # follow up in 3 weeks/MD-labs;Caebo-Gem; 1 week- cbc/bmp- Rituxan-Gem; day-9- udenyca.   Cc: Dr.Miller  All questions were answered. The patient knows to call the clinic with any problems, questions or concerns.    Cammie Sickle, MD 11/27/2017 8:31 AM

## 2017-11-20 NOTE — Assessment & Plan Note (Addendum)
RECURRENT DLBCL-  triple hit diffuse large B-cell lymphoma-;March 2019- PET scan-~  4 cm soft tissue mass noted inferior to the left kidney SUV 15 s/p   Biopsy of the mass has confirmed recurrence of the large cell lymphoma.  # Proceed with cycle #1 carbo-Gem; today; Labs today reviewed;  acceptable for treatment today.   #Discussed overall plan that I would recommend a restaging scan after approximately 2 cycles; depending upon response/plan for stem cell transplant/tolerance-total number of cycles ranged between 4-6.  Discussed for now the goal of treatment is cure-even though its fairly low.   #Patient is awaiting repeat evaluation at [facility]; bone marrow transplant team for possible consideration of autologous stem cell transplant.  Patient interested in car T-cell therapy also.  #Again reviewed the potential side effects-at length with the patient and family.   #Peripheral neuropathy/foot drop-currently improved.  # follow up in 3 weeks/MD-labs;Caebo-Gem; 1 week- cbc/bmp- Rituxan-Gem; day-9- udenyca.   Cc: Dr.Miller

## 2017-11-20 NOTE — [place] Notes (Signed)
Okay to proceed with treatment today without AST, ALT, and bili per Dr. Rogue Bussing.

## 2017-11-20 NOTE — Progress Notes (Signed)
Patient is here today for a follow up. Patient states no new concerns today.  

## 2017-11-27 ENCOUNTER — Inpatient Hospital Stay: Payer: Medicare Other

## 2017-11-27 ENCOUNTER — Other Ambulatory Visit: Payer: Self-pay | Admitting: Internal Medicine

## 2017-11-27 ENCOUNTER — Other Ambulatory Visit: Payer: Self-pay | Admitting: *Deleted

## 2017-11-27 VITALS — BP 100/63 | HR 69 | Temp 98.7°F | Resp 18

## 2017-11-27 DIAGNOSIS — C8333 Diffuse large B-cell lymphoma, intra-abdominal lymph nodes: Secondary | ICD-10-CM | POA: Diagnosis not present

## 2017-11-27 LAB — CBC WITH DIFFERENTIAL/PLATELET
Basophils Absolute: 0 10*3/uL (ref 0–0.1)
Basophils Relative: 1 %
EOS ABS: 0.3 10*3/uL (ref 0–0.7)
EOS PCT: 7 %
HCT: 37.7 % (ref 35.0–47.0)
Hemoglobin: 13.1 g/dL (ref 12.0–16.0)
LYMPHS ABS: 0.8 10*3/uL — AB (ref 1.0–3.6)
LYMPHS PCT: 16 %
MCH: 29.6 pg (ref 26.0–34.0)
MCHC: 34.6 g/dL (ref 32.0–36.0)
MCV: 85.6 fL (ref 80.0–100.0)
Monocytes Absolute: 0.3 10*3/uL (ref 0.2–0.9)
Monocytes Relative: 7 %
Neutro Abs: 3.4 10*3/uL (ref 1.4–6.5)
Neutrophils Relative %: 69 %
PLATELETS: 153 10*3/uL (ref 150–440)
RBC: 4.41 MIL/uL (ref 3.80–5.20)
RDW: 13.4 % (ref 11.5–14.5)
WBC: 4.9 10*3/uL (ref 3.6–11.0)

## 2017-11-27 LAB — BASIC METABOLIC PANEL
Anion gap: 9 (ref 5–15)
BUN: 17 mg/dL (ref 6–20)
CHLORIDE: 103 mmol/L (ref 101–111)
CO2: 25 mmol/L (ref 22–32)
CREATININE: 0.57 mg/dL (ref 0.44–1.00)
Calcium: 8.8 mg/dL — ABNORMAL LOW (ref 8.9–10.3)
GFR calc Af Amer: >60 mL/min (ref >60)
GFR calc non Af Amer: >60 mL/min (ref >60)
GLUCOSE: 164 mg/dL — AB (ref 65–99)
POTASSIUM: 3.8 mmol/L (ref 3.5–5.1)
SODIUM: 137 mmol/L (ref 135–145)

## 2017-11-27 MED ORDER — DIPHENHYDRAMINE HCL 25 MG PO CAPS
50.0000 mg | ORAL_CAPSULE | Freq: Once | ORAL | Status: AC
  Administered 2017-11-27: 50 mg via ORAL
  Filled 2017-11-27: qty 2

## 2017-11-27 MED ORDER — SODIUM CHLORIDE 0.9 % IV SOLN
375.0000 mg/m2 | Freq: Once | INTRAVENOUS | Status: AC
  Administered 2017-11-27: 700 mg via INTRAVENOUS
  Filled 2017-11-27: qty 50

## 2017-11-27 MED ORDER — SODIUM CHLORIDE 0.9 % IV SOLN
Freq: Once | INTRAVENOUS | Status: AC
  Administered 2017-11-27: 09:00:00 via INTRAVENOUS
  Filled 2017-11-27: qty 1000

## 2017-11-27 MED ORDER — SODIUM CHLORIDE 0.9 % IV SOLN
1800.0000 mg | Freq: Once | INTRAVENOUS | Status: AC
  Administered 2017-11-27: 1800 mg via INTRAVENOUS
  Filled 2017-11-27: qty 26.3

## 2017-11-27 MED ORDER — PROCHLORPERAZINE MALEATE 10 MG PO TABS
10.0000 mg | ORAL_TABLET | Freq: Once | ORAL | Status: AC
  Administered 2017-11-27: 10 mg via ORAL
  Filled 2017-11-27: qty 1

## 2017-11-27 MED ORDER — ACETAMINOPHEN 325 MG PO TABS
650.0000 mg | ORAL_TABLET | Freq: Once | ORAL | Status: AC
  Administered 2017-11-27: 650 mg via ORAL
  Filled 2017-11-27: qty 2

## 2017-11-28 ENCOUNTER — Inpatient Hospital Stay: Payer: Medicare Other

## 2017-11-28 DIAGNOSIS — C8333 Diffuse large B-cell lymphoma, intra-abdominal lymph nodes: Secondary | ICD-10-CM | POA: Diagnosis not present

## 2017-11-28 MED ORDER — PEGFILGRASTIM-CBQV 6 MG/0.6ML ~~LOC~~ SOSY
6.0000 mg | PREFILLED_SYRINGE | Freq: Once | SUBCUTANEOUS | Status: AC
  Administered 2017-11-28: 6 mg via SUBCUTANEOUS
  Filled 2017-11-28: qty 0.6

## 2017-12-11 ENCOUNTER — Encounter: Payer: Self-pay | Admitting: Internal Medicine

## 2017-12-11 ENCOUNTER — Inpatient Hospital Stay: Payer: Medicare Other

## 2017-12-11 ENCOUNTER — Inpatient Hospital Stay: Payer: Medicare Other | Admitting: Internal Medicine

## 2017-12-11 VITALS — BP 112/72 | HR 73 | Temp 98.3°F | Resp 16 | Wt 155.0 lb

## 2017-12-11 DIAGNOSIS — R6884 Jaw pain: Secondary | ICD-10-CM | POA: Diagnosis not present

## 2017-12-11 DIAGNOSIS — C8333 Diffuse large B-cell lymphoma, intra-abdominal lymph nodes: Secondary | ICD-10-CM

## 2017-12-11 DIAGNOSIS — E7800 Pure hypercholesterolemia, unspecified: Secondary | ICD-10-CM | POA: Diagnosis not present

## 2017-12-11 DIAGNOSIS — Z923 Personal history of irradiation: Secondary | ICD-10-CM

## 2017-12-11 DIAGNOSIS — Z79899 Other long term (current) drug therapy: Secondary | ICD-10-CM | POA: Diagnosis not present

## 2017-12-11 DIAGNOSIS — M21379 Foot drop, unspecified foot: Secondary | ICD-10-CM

## 2017-12-11 DIAGNOSIS — I10 Essential (primary) hypertension: Secondary | ICD-10-CM

## 2017-12-11 DIAGNOSIS — E119 Type 2 diabetes mellitus without complications: Secondary | ICD-10-CM

## 2017-12-11 DIAGNOSIS — G629 Polyneuropathy, unspecified: Secondary | ICD-10-CM | POA: Diagnosis not present

## 2017-12-11 DIAGNOSIS — Z87891 Personal history of nicotine dependence: Secondary | ICD-10-CM

## 2017-12-11 DIAGNOSIS — Z7984 Long term (current) use of oral hypoglycemic drugs: Secondary | ICD-10-CM

## 2017-12-11 DIAGNOSIS — H9202 Otalgia, left ear: Secondary | ICD-10-CM

## 2017-12-11 DIAGNOSIS — J849 Interstitial pulmonary disease, unspecified: Secondary | ICD-10-CM | POA: Diagnosis not present

## 2017-12-11 LAB — COMPREHENSIVE METABOLIC PANEL
ALBUMIN: 3.9 g/dL (ref 3.5–5.0)
ALK PHOS: 75 U/L (ref 38–126)
ALT: 12 U/L — AB (ref 14–54)
AST: 20 U/L (ref 15–41)
Anion gap: 8 (ref 5–15)
BILIRUBIN TOTAL: 0.5 mg/dL (ref 0.3–1.2)
BUN: 13 mg/dL (ref 6–20)
CALCIUM: 9.2 mg/dL (ref 8.9–10.3)
CO2: 25 mmol/L (ref 22–32)
CREATININE: 0.49 mg/dL (ref 0.44–1.00)
Chloride: 105 mmol/L (ref 101–111)
GFR calc Af Amer: >60 mL/min (ref >60)
GLUCOSE: 143 mg/dL — AB (ref 65–99)
Potassium: 4 mmol/L (ref 3.5–5.1)
Sodium: 138 mmol/L (ref 135–145)
TOTAL PROTEIN: 6.6 g/dL (ref 6.5–8.1)

## 2017-12-11 LAB — CBC WITH DIFFERENTIAL/PLATELET
Basophils Absolute: 0 10*3/uL (ref 0–0.1)
Basophils Relative: 0 %
Eosinophils Absolute: 0.2 10*3/uL (ref 0–0.7)
Eosinophils Relative: 2 %
HEMATOCRIT: 34.2 % — AB (ref 35.0–47.0)
HEMOGLOBIN: 11.7 g/dL — AB (ref 12.0–16.0)
Lymphocytes Relative: 10 %
Lymphs Abs: 0.8 10*3/uL — ABNORMAL LOW (ref 1.0–3.6)
MCH: 30.5 pg (ref 26.0–34.0)
MCHC: 34.4 g/dL (ref 32.0–36.0)
MCV: 88.7 fL (ref 80.0–100.0)
MONOS PCT: 17 %
Monocytes Absolute: 1.3 10*3/uL — ABNORMAL HIGH (ref 0.2–0.9)
NEUTROS ABS: 5.1 10*3/uL (ref 1.4–6.5)
NEUTROS PCT: 71 %
Platelets: 181 10*3/uL (ref 150–440)
RBC: 3.85 MIL/uL (ref 3.80–5.20)
RDW: 14.8 % — ABNORMAL HIGH (ref 11.5–14.5)
WBC: 7.4 10*3/uL (ref 3.6–11.0)

## 2017-12-11 MED ORDER — SODIUM CHLORIDE 0.9% FLUSH
10.0000 mL | INTRAVENOUS | Status: DC | PRN
  Filled 2017-12-11: qty 10

## 2017-12-11 MED ORDER — PALONOSETRON HCL INJECTION 0.25 MG/5ML
0.2500 mg | Freq: Once | INTRAVENOUS | Status: AC
  Administered 2017-12-11: 0.25 mg via INTRAVENOUS

## 2017-12-11 MED ORDER — SODIUM CHLORIDE 0.9 % IV SOLN
410.0000 mg | Freq: Once | INTRAVENOUS | Status: AC
  Administered 2017-12-11: 410 mg via INTRAVENOUS
  Filled 2017-12-11: qty 41

## 2017-12-11 MED ORDER — GEMCITABINE HCL CHEMO INJECTION 1 GM/26.3ML
1800.0000 mg | Freq: Once | INTRAVENOUS | Status: AC
  Administered 2017-12-11: 1800 mg via INTRAVENOUS
  Filled 2017-12-11: qty 26.3

## 2017-12-11 MED ORDER — HEPARIN SOD (PORK) LOCK FLUSH 100 UNIT/ML IV SOLN
500.0000 [IU] | Freq: Once | INTRAVENOUS | Status: AC | PRN
  Administered 2017-12-11: 500 [IU]
  Filled 2017-12-11: qty 5

## 2017-12-11 MED ORDER — SODIUM CHLORIDE 0.9 % IV SOLN
Freq: Once | INTRAVENOUS | Status: AC
  Administered 2017-12-11: 10:00:00 via INTRAVENOUS
  Filled 2017-12-11: qty 1000

## 2017-12-11 MED ORDER — SODIUM CHLORIDE 0.9 % IV SOLN
Freq: Once | INTRAVENOUS | Status: AC
  Administered 2017-12-11: 11:00:00 via INTRAVENOUS
  Filled 2017-12-11: qty 5

## 2017-12-11 NOTE — Progress Notes (Signed)
[facility] NOTE  Patient Care Team: Rusty Aus, [name]  CHIEF COMPLAINTS/PURPOSE OF CONSULTATION: Abdominal mass  Oncology History   DEC 2017- DLBCL "TRIPLE HIT [myc/ bcl-2/bcl-6 gene rearrangement FISH]" ~10 cm mass RP LN; STAGE II [jan 2018- BMBx-NEG]; Jan 8th R-CHOP;   # JAN 29th 2018- DA-R EPOCH x5 ; PET CR; s/p RT [last 03/18/2017]  # FEB-MARCH 2019- RECURRENCE of DLBCL [s/p RP Bx; 4 cm mass inferior to Left Kidney] ; II [facility]; Dr.Grover.   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  Patient denies any significant nausea vomiting.  Complains of left ear pain/jaw pain; also tinnitus/difficulty hearing.  No vertigo.  Denies any chest pain or shortness of breath cough.  Patient denies any headaches.  Denies any tingling or numbness-  feels the left leg is slightly weak /dragging but no falls.  Denies any night sweats or lumps or bumps.  ROS: A complete 10 point review of system is done which is negative except mentioned above in history of present illness  MEDICAL HISTORY:  Past Medical  History:  Diagnosis Date  . Diabetes mellitus without complication ([place])   . Heart murmur   . History of chemotherapy 07/08/2017  . History of gastric ulcer   . History of radiation therapy 07/08/2017  . Hypercholesterolemia   . Hypertension   . ILD (interstitial lung disease) ([place])    8 yrs ago  . Lymphadenopathy     SURGICAL HISTORY: Past Surgical History:  Procedure Laterality Date  . BREAST BIOPSY Left 2010   neg  . CATARACT EXTRACTION W/PHACO Left 10/16/2017   Procedure: CATARACT EXTRACTION PHACO AND INTRAOCULAR LENS PLACEMENT ([place]) COMPLICATED DIABETIC LEFT;  Surgeon: Leandrew Koyanagi, MD;  Location: [facility];  Service: Ophthalmology;  Laterality: Left;  IRIS HOOKS Diabetic - oral meds  . HERNIA REPAIR    . PARTIAL HYSTERECTOMY     age 40  . PERIPHERAL VASCULAR CATHETERIZATION N/A 08/22/2016   Procedure: [name];  Surgeon: [name], MD;  Location: [facility];  Service: Cardiovascular;  Laterality: N/A;  . TONSILLECTOMY      SOCIAL HISTORY: quit smoking 40 years ago; human resources retd; in [place]; no alcohol Social History   Socioeconomic History  . Marital status: Married    Spouse name: Paulina [place]  . Number of children: 2  . Years of education: Not on file  . Highest education level: Not on file  Occupational History  . Occupation: Retired  Scientific laboratory technician  .  Financial resource strain: Not on file  . Food insecurity:    Worry: Not on file    Inability: Not on file  . Transportation needs:    Medical: Not on file    Non-medical: Not on file  Tobacco Use  . Smoking status: Former Smoker    Last attempt to quit: 08/21/1958    Years since quitting: 59.3  . Smokeless tobacco: Never Used  Substance and Sexual Activity  . Alcohol use: No  . Drug use: No  . Sexual activity: Never  Lifestyle  . Physical activity:    Days per week: Not on file    Minutes per session: Not on file  . Stress: Not on file   Relationships  . Social connections:    Talks on phone: Not on file    Gets together: Not on file    Attends religious service: Not on file    Active member of club or organization: Not on file    Attends meetings of clubs or organizations: Not on file    Relationship status: Not on file  . Intimate partner violence:    Fear of current or ex partner: Not on file    Emotionally abused: Not on file    Physically abused: Not on file    Forced sexual activity: Not on file  Other Topics Concern  . Not on file  Social History Narrative   Not applicable at this time    FAMILY HISTORY: Family History  Problem Relation Age of Onset  . Heart disease Mother   . Heart disease Father   . Heart disease Brother     ALLERGIES:  is allergic to levaquin [levofloxacin in d5w]; cefuroxime axetil; and doxycycline.  MEDICATIONS:  Current Outpatient Medications  Medication Sig Dispense Refill  . b complex vitamins tablet Take 1 tablet by mouth daily.    Marland Kitchen dexamethasone (DECADRON) 4 MG tablet Take 10 pills a day x 4 days; start the day after chemo; EVERY 3 WEEKS. 40 tablet 4  . gabapentin (NEURONTIN) 300 MG capsule Take 1 capsule (300 mg total) by mouth 2 (two) times daily. 180 capsule 4  . latanoprost (XALATAN) 0.005 % ophthalmic solution Place 1 drop into both eyes at bedtime.     . lidocaine-prilocaine (EMLA) cream Apply cream 1 hour before chemotherapy treatment, place small amount of saran wrap over cream to protect clothing. 30 g 1  . Magnesium 400 MG TABS Take 1 tablet by mouth daily.    . metFORMIN (GLUCOPHAGE) 500 MG tablet Take 250 mg by mouth daily with breakfast.     . metoprolol tartrate (LOPRESSOR) 25 MG tablet Take 1 tablet (25 mg total) by mouth 2 (two) times daily. 180 tablet 4  . pravastatin (PRAVACHOL) 40 MG tablet Take 1 tablet by mouth daily.    . sucralfate (CARAFATE) 1 g tablet Take 1/2 tablet 2 times daily. 30 tablet 6  . ondansetron (ZOFRAN) 8 MG tablet One pill every 8  hours as needed for nausea/vomitting. (Patient not taking: Reported on 11/20/2017) 40 tablet 1  . prochlorperazine (COMPAZINE) 10 MG tablet Take 1 tablet (10 mg total) by mouth every 6 (six) hours as needed for nausea, vomiting or refractory nausea / vomiting. (Patient not taking: Reported on 11/20/2017) 40 tablet 0   No current facility-administered medications for this visit.    Facility-Administered Medications Ordered in Other Visits  Medication Dose Route Frequency Provider Last Rate Last Dose  . 0.9 %  sodium chloride infusion  Intravenous Continuous Cammie Sickle, MD 10 mL/hr at 12/05/16 1030    . 0.9 %  sodium chloride infusion   Intravenous Continuous Cammie Sickle, MD   Stopped at 12/26/16 1011  . 0.9 %  sodium chloride infusion   Intravenous Continuous Cammie Sickle, MD 10 mL/hr at 12/27/16 0930    . 0.9 %  sodium chloride infusion   Intravenous Once Charlaine Dalton R, MD      . CARBOplatin (PARAPLATIN) 410 mg in sodium chloride 0.9 % 250 mL chemo infusion  410 mg Intravenous Once Charlaine Dalton R, MD      . fosaprepitant (EMEND) 150 mg, dexamethasone (DECADRON) 12 mg in sodium chloride 0.9 % 145 mL IVPB   Intravenous Once Charlaine Dalton R, MD      . gemcitabine (GEMZAR) 1,800 mg in sodium chloride 0.9 % 250 mL chemo infusion  1,800 mg Intravenous Once Charlaine Dalton R, MD      . heparin lock flush 100 unit/mL  500 Units Intravenous Once Charlaine Dalton R, MD      . heparin lock flush 100 unit/mL  500 Units Intravenous Once Charlaine Dalton R, MD      . heparin lock flush 100 unit/mL  500 Units Intracatheter Once PRN Cammie Sickle, MD      . palonosetron (ALOXI) injection 0.25 mg  0.25 mg Intravenous Once Charlaine Dalton R, MD      . sodium chloride flush (NS) 0.9 % injection 10 mL  10 mL Intravenous PRN Cammie Sickle, MD   10 mL at 11/06/16 1000  . sodium chloride flush (NS) 0.9 % injection 10 mL  10 mL Intravenous PRN  Cammie Sickle, MD   10 mL at 11/07/16 0905  . sodium chloride flush (NS) 0.9 % injection 10 mL  10 mL Intravenous PRN Charlaine Dalton R, MD      . sodium chloride flush (NS) 0.9 % injection 10 mL  10 mL Intravenous PRN Cammie Sickle, MD   10 mL at 12/05/16 1030  . sodium chloride flush (NS) 0.9 % injection 10 mL  10 mL Intravenous PRN Cammie Sickle, MD   10 mL at 12/27/16 0930  . sodium chloride flush (NS) 0.9 % injection 10 mL  10 mL Intracatheter PRN Cammie Sickle, MD          .  PHYSICAL EXAMINATION: ECOG PERFORMANCE STATUS: 1 - Symptomatic but completely ambulatory  Vitals:   12/11/17 0911  BP: 112/72  Pulse: 73  Resp: 16  Temp: 98.3 F (36.8 C)   Filed Weights   12/11/17 0911  Weight: 155 lb (70.3 kg)    GENERAL: Well-nourished well-developed; Alert, no distress and comfortable.   With her husband.  EYES: no pallor or icterus;  OROPHARYNX: no thrush or ulceration; good dentition  NECK: supple, no masses felt LYMPH:  no palpable lymphadenopathy in the cervical, axillary or inguinal regions LUNGS: Positive for crackles at the bases auscultation and  No wheeze.  HEART/CVS: regular rate & rhythm and no murmurs; No lower extremity edema ABDOMEN: abdomen soft, no tenderness; normal bowel sounds.  PSYCH: alert & oriented x 3 with fluent speech NEURO: no focal motor/sensory deficits. Resolved. SKIN:  no rashes or significant lesions  LABORATORY DATA:  I have reviewed the data as listed Lab Results  Component Value Date   WBC 7.4 12/11/2017   HGB 11.7 (L) 12/11/2017   HCT 34.2 (L) 12/11/2017   MCV 88.7 12/11/2017  PLT 181 12/11/2017   Recent Labs    10/23/17 1316 11/05/17 0837 11/20/17 0906 11/27/17 0855 12/11/17 0830  NA 139 141 141 137 138  K 3.8 4.3 4.1 3.8 4.0  CL 106 104 107 103 105  CO2 26 28 26 25 25   GLUCOSE 99 130* 121* 164* 143*  BUN 10 18 14 17 13   CREATININE 0.32* 0.49 0.47 0.57 0.49  CALCIUM 8.9 9.4 8.8* 8.8*  9.2  GFRNONAA >60 >60 >60 >60 >60  GFRAA >60 >60 >60 >60 >60  PROT 6.8 7.1  --   --  6.6  ALBUMIN 3.8 4.1  --   --  3.9  AST 14* 17  --   --  20  ALT 9* 10*  --   --  12*  ALKPHOS 68 70  --   --  75  BILITOT 0.5 0.5  --   --  0.5    RADIOGRAPHIC STUDIES: I have personally reviewed the radiological images as listed and agreed with the findings in the report. No results found.IMPRESSION: New hypermetabolic 4.3 cm soft tissue mass inferior to the left kidney, worrisome for recurrent lymphoma. [place] 5.  Mild residual nodal soft tissue in the left para-aortic region, favoring treated lymphoma, stable versus mildly decreased.  Additional stable ancillary findings as above.   Electronically Signed   By: Julian Hy M.D.   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   # patient currently on RGDP [carboplatin]- Status post cycle #1; tolerated chemotherapy extremely well except for earache/tinnitus [see discussion below]  # Proceed with cycle #2 carbo-Gem; today; Labs today reviewed;  acceptable for treatment today.  We will plan PET scan after this cycle of chemotherapy; further options would be based upon the results of the PET scan.  # #Patient s/pevaluation at [facility]; bone marrow transplant team for possible consideration of autologous stem cell transplant./ car T-cell therapy also.  # Left ear aches/ difficlty hearing/tinnitus-tinnitus is a potential side effect of carboplatin;  awaiting ENT Dr.Vaught/[place] appt 3 days. Tylenol prn.  Marland Kitchen   #Peripheral neuropathy/foot drop-stable.   # follow up in 3 weeks/MD-labs;Caebo-Gem; 1 week- cbc/bmp- Rituxan-Gem; day-9- udenyca; PET scan prior.     All questions were answered. The patient knows to call the clinic with any problems, questions or concerns.    Cammie Sickle, MD 12/11/2017 10:20 AM

## 2017-12-11 NOTE — Assessment & Plan Note (Addendum)
RECURRENT DLBCL-  triple hit diffuse large B-cell lymphoma-;March 2019- PET scan-~  4 cm soft tissue mass noted inferior to the left kidney SUV 15 s/p   Biopsy of the mass has confirmed recurrence of the large cell lymphoma.   # patient currently on RGDP [carboplatin]- Status post cycle #1; tolerated chemotherapy extremely well except for earache/tinnitus [see discussion below]  # Proceed with cycle #2 carbo-Gem; today; Labs today reviewed;  acceptable for treatment today.  We will plan PET scan after this cycle of chemotherapy; further options would be based upon the results of the PET scan.  # #Patient s/pevaluation at [facility]; bone marrow transplant team for possible consideration of autologous stem cell transplant./ car T-cell therapy also.  # Left ear aches/ difficlty hearing/tinnitus-tinnitus is a potential side effect of carboplatin;  awaiting ENT Dr.Vaught/[facility] appt 3 days. Tylenol prn.  Marland Kitchen   #Peripheral neuropathy/foot drop-stable.   # follow up in 3 weeks/MD-labs;Caebo-Gem; 1 week- cbc/bmp- Rituxan-Gem; day-9- udenyca; PET scan prior.

## 2017-12-18 ENCOUNTER — Inpatient Hospital Stay: Payer: Medicare Other | Attending: Internal Medicine

## 2017-12-18 ENCOUNTER — Inpatient Hospital Stay: Payer: Medicare Other

## 2017-12-18 VITALS — BP 109/67 | HR 70 | Temp 97.3°F | Resp 18 | Wt 155.0 lb

## 2017-12-18 DIAGNOSIS — I10 Essential (primary) hypertension: Secondary | ICD-10-CM | POA: Diagnosis not present

## 2017-12-18 DIAGNOSIS — R5383 Other fatigue: Secondary | ICD-10-CM | POA: Diagnosis not present

## 2017-12-18 DIAGNOSIS — R0981 Nasal congestion: Secondary | ICD-10-CM | POA: Insufficient documentation

## 2017-12-18 DIAGNOSIS — I959 Hypotension, unspecified: Secondary | ICD-10-CM | POA: Insufficient documentation

## 2017-12-18 DIAGNOSIS — C8333 Diffuse large B-cell lymphoma, intra-abdominal lymph nodes: Secondary | ICD-10-CM

## 2017-12-18 DIAGNOSIS — E119 Type 2 diabetes mellitus without complications: Secondary | ICD-10-CM | POA: Diagnosis not present

## 2017-12-18 DIAGNOSIS — H9209 Otalgia, unspecified ear: Secondary | ICD-10-CM | POA: Insufficient documentation

## 2017-12-18 DIAGNOSIS — J849 Interstitial pulmonary disease, unspecified: Secondary | ICD-10-CM | POA: Insufficient documentation

## 2017-12-18 DIAGNOSIS — M21379 Foot drop, unspecified foot: Secondary | ICD-10-CM | POA: Insufficient documentation

## 2017-12-18 DIAGNOSIS — R5381 Other malaise: Secondary | ICD-10-CM | POA: Diagnosis not present

## 2017-12-18 DIAGNOSIS — Z5112 Encounter for antineoplastic immunotherapy: Secondary | ICD-10-CM | POA: Diagnosis not present

## 2017-12-18 DIAGNOSIS — Z87891 Personal history of nicotine dependence: Secondary | ICD-10-CM | POA: Diagnosis not present

## 2017-12-18 DIAGNOSIS — Z5111 Encounter for antineoplastic chemotherapy: Secondary | ICD-10-CM | POA: Insufficient documentation

## 2017-12-18 DIAGNOSIS — Z7689 Persons encountering health services in other specified circumstances: Secondary | ICD-10-CM | POA: Insufficient documentation

## 2017-12-18 DIAGNOSIS — G629 Polyneuropathy, unspecified: Secondary | ICD-10-CM | POA: Diagnosis not present

## 2017-12-18 DIAGNOSIS — R42 Dizziness and giddiness: Secondary | ICD-10-CM | POA: Insufficient documentation

## 2017-12-18 DIAGNOSIS — Z7984 Long term (current) use of oral hypoglycemic drugs: Secondary | ICD-10-CM | POA: Diagnosis not present

## 2017-12-18 DIAGNOSIS — Z79899 Other long term (current) drug therapy: Secondary | ICD-10-CM | POA: Insufficient documentation

## 2017-12-18 DIAGNOSIS — R197 Diarrhea, unspecified: Secondary | ICD-10-CM | POA: Diagnosis not present

## 2017-12-18 DIAGNOSIS — H9190 Unspecified hearing loss, unspecified ear: Secondary | ICD-10-CM | POA: Insufficient documentation

## 2017-12-18 LAB — CBC WITH DIFFERENTIAL/PLATELET
BASOS ABS: 0 10*3/uL (ref 0–0.1)
Basophils Relative: 1 %
Eosinophils Absolute: 0.1 10*3/uL (ref 0–0.7)
Eosinophils Relative: 1 %
HEMATOCRIT: 33.6 % — AB (ref 35.0–47.0)
HEMOGLOBIN: 11.8 g/dL — AB (ref 12.0–16.0)
LYMPHS ABS: 0.8 10*3/uL — AB (ref 1.0–3.6)
LYMPHS PCT: 10 %
MCH: 30.8 pg (ref 26.0–34.0)
MCHC: 35.1 g/dL (ref 32.0–36.0)
MCV: 87.5 fL (ref 80.0–100.0)
Monocytes Absolute: 0.9 10*3/uL (ref 0.2–0.9)
Monocytes Relative: 12 %
NEUTROS ABS: 5.9 10*3/uL (ref 1.4–6.5)
Neutrophils Relative %: 76 %
PLATELETS: 174 10*3/uL (ref 150–440)
RBC: 3.84 MIL/uL (ref 3.80–5.20)
RDW: 15.4 % — ABNORMAL HIGH (ref 11.5–14.5)
WBC: 7.7 10*3/uL (ref 3.6–11.0)

## 2017-12-18 LAB — BASIC METABOLIC PANEL
ANION GAP: 9 (ref 5–15)
BUN: 16 mg/dL (ref 6–20)
CHLORIDE: 104 mmol/L (ref 101–111)
CO2: 24 mmol/L (ref 22–32)
Calcium: 8.8 mg/dL — ABNORMAL LOW (ref 8.9–10.3)
Creatinine, Ser: 0.45 mg/dL (ref 0.44–1.00)
GFR calc Af Amer: >60 mL/min (ref >60)
Glucose, Bld: 150 mg/dL — ABNORMAL HIGH (ref 65–99)
POTASSIUM: 3.5 mmol/L (ref 3.5–5.1)
Sodium: 137 mmol/L (ref 135–145)

## 2017-12-18 MED ORDER — PROCHLORPERAZINE MALEATE 10 MG PO TABS
10.0000 mg | ORAL_TABLET | Freq: Once | ORAL | Status: DC
Start: 2017-12-18 — End: 2017-12-18

## 2017-12-18 MED ORDER — DIPHENHYDRAMINE HCL 25 MG PO CAPS
50.0000 mg | ORAL_CAPSULE | Freq: Once | ORAL | Status: AC
  Administered 2017-12-18: 50 mg via ORAL
  Filled 2017-12-18: qty 2

## 2017-12-18 MED ORDER — SODIUM CHLORIDE 0.9 % IV SOLN
375.0000 mg/m2 | Freq: Once | INTRAVENOUS | Status: AC
  Administered 2017-12-18: 700 mg via INTRAVENOUS
  Filled 2017-12-18: qty 50

## 2017-12-18 MED ORDER — PROCHLORPERAZINE EDISYLATE 10 MG/2ML IJ SOLN
10.0000 mg | Freq: Once | INTRAMUSCULAR | Status: AC
  Administered 2017-12-18: 10 mg via INTRAVENOUS
  Filled 2017-12-18: qty 2

## 2017-12-18 MED ORDER — HEPARIN SOD (PORK) LOCK FLUSH 100 UNIT/ML IV SOLN
500.0000 [IU] | Freq: Once | INTRAVENOUS | Status: AC
  Administered 2017-12-18: 500 [IU] via INTRAVENOUS
  Filled 2017-12-18: qty 5

## 2017-12-18 MED ORDER — SODIUM CHLORIDE 0.9% FLUSH
10.0000 mL | Freq: Once | INTRAVENOUS | Status: AC
  Administered 2017-12-18: 10 mL via INTRAVENOUS
  Filled 2017-12-18: qty 10

## 2017-12-18 MED ORDER — RITUXIMAB CHEMO INJECTION 500 MG/50ML: 375.0000 mg/m2 | Freq: Once | INTRAVENOUS | Status: DC

## 2017-12-18 MED ORDER — SODIUM CHLORIDE 0.9 % IV SOLN
1800.0000 mg | Freq: Once | INTRAVENOUS | Status: AC
  Administered 2017-12-18: 1800 mg via INTRAVENOUS
  Filled 2017-12-18: qty 26.3

## 2017-12-18 MED ORDER — ACETAMINOPHEN 325 MG PO TABS
650.0000 mg | ORAL_TABLET | Freq: Once | ORAL | Status: AC
  Administered 2017-12-18: 650 mg via ORAL
  Filled 2017-12-18: qty 2

## 2017-12-18 MED ORDER — PROCHLORPERAZINE EDISYLATE 10 MG/2ML IJ SOLN
10.0000 mg | Freq: Once | INTRAMUSCULAR | Status: DC
  Filled 2017-12-18: qty 2

## 2017-12-18 MED ORDER — SODIUM CHLORIDE 0.9 % IV SOLN
Freq: Once | INTRAVENOUS | Status: AC
  Administered 2017-12-18: 10:00:00 via INTRAVENOUS
  Filled 2017-12-18: qty 1000

## 2017-12-19 ENCOUNTER — Inpatient Hospital Stay: Payer: Medicare Other

## 2017-12-19 DIAGNOSIS — C8333 Diffuse large B-cell lymphoma, intra-abdominal lymph nodes: Secondary | ICD-10-CM

## 2017-12-19 MED ORDER — PEGFILGRASTIM-CBQV 6 MG/0.6ML ~~LOC~~ SOSY
6.0000 mg | PREFILLED_SYRINGE | Freq: Once | SUBCUTANEOUS | Status: AC
  Administered 2017-12-19: 6 mg via SUBCUTANEOUS
  Filled 2017-12-19: qty 0.6

## 2017-12-30 ENCOUNTER — Ambulatory Visit
Admission: RE | Admit: 2017-12-30 | Discharge: 2017-12-30 | Disposition: A | Discharge status: Home / Self Care | Payer: Medicare Other | Source: Ambulatory Visit | Attending: Internal Medicine | Admitting: Internal Medicine

## 2017-12-30 DIAGNOSIS — J8410 Pulmonary fibrosis, unspecified: Secondary | ICD-10-CM | POA: Insufficient documentation

## 2017-12-30 DIAGNOSIS — I700 Atherosclerosis of aorta: Secondary | ICD-10-CM | POA: Insufficient documentation

## 2017-12-30 DIAGNOSIS — I2510 Atherosclerotic heart disease of native coronary artery without angina pectoris: Secondary | ICD-10-CM | POA: Diagnosis not present

## 2017-12-30 DIAGNOSIS — C8333 Diffuse large B-cell lymphoma, intra-abdominal lymph nodes: Secondary | ICD-10-CM | POA: Insufficient documentation

## 2017-12-30 LAB — GLUCOSE, CAPILLARY: GLUCOSE-CAPILLARY: 129 mg/dL — AB (ref 65–99)

## 2017-12-30 MED ORDER — FLUDEOXYGLUCOSE F - 18 (FDG) INJECTION
8.8600 | Freq: Once | INTRAVENOUS | Status: AC | PRN
  Administered 2017-12-30: 8.86 via INTRAVENOUS

## 2018-01-01 ENCOUNTER — Inpatient Hospital Stay: Payer: Medicare Other

## 2018-01-01 ENCOUNTER — Encounter: Payer: Self-pay | Admitting: Internal Medicine

## 2018-01-01 ENCOUNTER — Inpatient Hospital Stay: Payer: Medicare Other | Admitting: Internal Medicine

## 2018-01-01 VITALS — BP 101/62 | HR 75 | Temp 97.6°F | Resp 16 | Wt 156.0 lb

## 2018-01-01 DIAGNOSIS — R5383 Other fatigue: Secondary | ICD-10-CM

## 2018-01-01 DIAGNOSIS — Z79899 Other long term (current) drug therapy: Secondary | ICD-10-CM | POA: Diagnosis not present

## 2018-01-01 DIAGNOSIS — Z87891 Personal history of nicotine dependence: Secondary | ICD-10-CM | POA: Diagnosis not present

## 2018-01-01 DIAGNOSIS — R197 Diarrhea, unspecified: Secondary | ICD-10-CM

## 2018-01-01 DIAGNOSIS — I10 Essential (primary) hypertension: Secondary | ICD-10-CM | POA: Diagnosis not present

## 2018-01-01 DIAGNOSIS — C8333 Diffuse large B-cell lymphoma, intra-abdominal lymph nodes: Secondary | ICD-10-CM

## 2018-01-01 DIAGNOSIS — G629 Polyneuropathy, unspecified: Secondary | ICD-10-CM | POA: Diagnosis not present

## 2018-01-01 DIAGNOSIS — J849 Interstitial pulmonary disease, unspecified: Secondary | ICD-10-CM

## 2018-01-01 DIAGNOSIS — E119 Type 2 diabetes mellitus without complications: Secondary | ICD-10-CM

## 2018-01-01 DIAGNOSIS — Z7984 Long term (current) use of oral hypoglycemic drugs: Secondary | ICD-10-CM

## 2018-01-01 DIAGNOSIS — R5381 Other malaise: Secondary | ICD-10-CM | POA: Diagnosis not present

## 2018-01-01 DIAGNOSIS — M21379 Foot drop, unspecified foot: Secondary | ICD-10-CM

## 2018-01-01 LAB — COMPREHENSIVE METABOLIC PANEL
ALT: 17 U/L (ref 14–54)
ANION GAP: 8 (ref 5–15)
AST: 23 U/L (ref 15–41)
Albumin: 4 g/dL (ref 3.5–5.0)
Alkaline Phosphatase: 77 U/L (ref 38–126)
BILIRUBIN TOTAL: 0.5 mg/dL (ref 0.3–1.2)
BUN: 12 mg/dL (ref 6–20)
CALCIUM: 9.4 mg/dL (ref 8.9–10.3)
CO2: 27 mmol/L (ref 22–32)
Chloride: 105 mmol/L (ref 101–111)
Creatinine, Ser: 0.52 mg/dL (ref 0.44–1.00)
GFR calc Af Amer: >60 mL/min (ref >60)
Glucose, Bld: 142 mg/dL — ABNORMAL HIGH (ref 65–99)
POTASSIUM: 4.2 mmol/L (ref 3.5–5.1)
Sodium: 140 mmol/L (ref 135–145)
TOTAL PROTEIN: 6.6 g/dL (ref 6.5–8.1)

## 2018-01-01 LAB — CBC WITH DIFFERENTIAL/PLATELET
BASOS PCT: 1 %
Basophils Absolute: 0.1 10*3/uL (ref 0–0.1)
EOS PCT: 2 %
Eosinophils Absolute: 0.1 10*3/uL (ref 0–0.7)
HEMATOCRIT: 33.8 % — AB (ref 35.0–47.0)
Hemoglobin: 11.6 g/dL — ABNORMAL LOW (ref 12.0–16.0)
LYMPHS PCT: 10 %
Lymphs Abs: 0.6 10*3/uL — ABNORMAL LOW (ref 1.0–3.6)
MCH: 31.5 pg (ref 26.0–34.0)
MCHC: 34.4 g/dL (ref 32.0–36.0)
MCV: 91.6 fL (ref 80.0–100.0)
Monocytes Absolute: 1.4 10*3/uL — ABNORMAL HIGH (ref 0.2–0.9)
Monocytes Relative: 21 %
NEUTROS ABS: 4.2 10*3/uL (ref 1.4–6.5)
Neutrophils Relative %: 66 %
PLATELETS: 217 10*3/uL (ref 150–440)
RBC: 3.69 MIL/uL — AB (ref 3.80–5.20)
RDW: 20 % — ABNORMAL HIGH (ref 11.5–14.5)
WBC: 6.4 10*3/uL (ref 3.6–11.0)

## 2018-01-01 MED ORDER — SODIUM CHLORIDE 0.9 % IV SOLN
Freq: Once | INTRAVENOUS | Status: AC
  Administered 2018-01-01: 10:00:00 via INTRAVENOUS
  Filled 2018-01-01: qty 5

## 2018-01-01 MED ORDER — SODIUM CHLORIDE 0.9% FLUSH
10.0000 mL | INTRAVENOUS | Status: DC | PRN
  Administered 2018-01-01: 10 mL via INTRAVENOUS
  Filled 2018-01-01: qty 10

## 2018-01-01 MED ORDER — PALONOSETRON HCL INJECTION 0.25 MG/5ML
0.2500 mg | Freq: Once | INTRAVENOUS | Status: AC
  Administered 2018-01-01: 0.25 mg via INTRAVENOUS

## 2018-01-01 MED ORDER — SODIUM CHLORIDE 0.9 % IV SOLN
Freq: Once | INTRAVENOUS | Status: AC
  Administered 2018-01-01: 10:00:00 via INTRAVENOUS
  Filled 2018-01-01: qty 1000

## 2018-01-01 MED ORDER — CARBOPLATIN CHEMO INJECTION 600 MG/60ML
412.0000 mg | Freq: Once | INTRAVENOUS | Status: AC
  Administered 2018-01-01: 410 mg via INTRAVENOUS
  Filled 2018-01-01: qty 41

## 2018-01-01 MED ORDER — HEPARIN SOD (PORK) LOCK FLUSH 100 UNIT/ML IV SOLN
500.0000 [IU] | Freq: Once | INTRAVENOUS | Status: AC | PRN
  Administered 2018-01-01: 500 [IU]
  Filled 2018-01-01 (×2): qty 5

## 2018-01-01 MED ORDER — SODIUM CHLORIDE 0.9 % IV SOLN
1800.0000 mg | Freq: Once | INTRAVENOUS | Status: AC
  Administered 2018-01-01: 1800 mg via INTRAVENOUS
  Filled 2018-01-01: qty 26.3

## 2018-01-01 NOTE — Assessment & Plan Note (Addendum)
RECURRENT DLBCL-  triple hit diffuse large B-cell lymphoma [ March 2019] currently on RGDP [carboplatin]- Status post cycle #2; may 13th PET -PR the left infrarenal soft tissue mass decreased about 3 cm in size [previously 4 cm]; and also SCV currently is 2.5 [ previously 15].  No evidence of any progression.  # proceed with cycle # 3 day 1 [carbo gem]; Labs today reviewed;  acceptable for treatment today.  Discussed that we will plan total of 4 cycles if proceeding with transplant post treatment.  We will also discuss with transplant physician at [facility].  # Diarrhea-question  Gemcitabine.  Currently improved.  Continue Imodium as needed  #Left hearing difficulty/earache-status post ENT evaluation improved.   #Peripheral neuropathy/foot drop-stable.   # follow up in 3 weeks/MD-labs;Carbo-Gem; 1 week- cbc/bmp- Rituxan-Gem; day-9- udenyca.  # I reviewed the blood work- with the patient in detail; also reviewed the imaging independently [as summarized above]; and with the patient in detail.

## 2018-01-01 NOTE — Progress Notes (Signed)
[facility] OFFICE PROGRESS NOTE  Patient Care Team: Rusty Aus, [name]  Cancer Staging No matching staging information was found for the patient.   Oncology History   DEC 2017- DLBCL "TRIPLE HIT [myc/ bcl-2/bcl-6 gene rearrangement FISH]" ~10 cm mass RP LN; STAGE II [jan 2018- BMBx-NEG]; Jan 8th R-CHOP;   # JAN 29th 2018- DA-R EPOCH x5 ; PET CR; s/p RT [last 03/18/2017]  # FEB-MARCH 2019- RECURRENCE of DLBCL [s/p RP Bx; 4 cm mass inferior to Left Kidney] ; II [facility]; Dr.Grover.   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  female pleasant patient above history of relapsed diffuse large B-cell lymphoma currently on salvage chemotherapy with gemcitabine/rituximab dexamethasone/carboplatin status post cycle #2 is here for follow-up/review the results of her restaging PET scan.  Patient admits to diarrhea 2-3 a day intermittently for the last 2 weeks.  It improves with Imodium.  She did not have any loose stools yesterday/or today.  No abdominal cramps.  Mild fatigue.  No worsening foot drop.  In general appetite is good.  No significant weight loss.  Review of Systems  Constitutional: Positive for malaise/fatigue. Negative for chills, diaphoresis, fever and weight loss.  HENT: Negative for nosebleeds and sore throat.   Eyes: Negative for double vision.  Respiratory: Negative for cough, hemoptysis, sputum production, shortness of breath and wheezing.   Cardiovascular: Negative for chest pain, palpitations, orthopnea and leg swelling.  Gastrointestinal: Positive for diarrhea. Negative for abdominal pain, blood in stool, constipation, heartburn, melena, nausea and vomiting.  Genitourinary: Negative for dysuria, frequency and urgency.  Musculoskeletal: Negative  for back pain and joint pain.  Skin: Negative.  Negative for itching and rash.  Neurological: Negative for dizziness, tingling, focal weakness, weakness and headaches.  Endo/Heme/Allergies: Does not bruise/bleed easily.  Psychiatric/Behavioral: Negative for depression. The patient is not nervous/anxious and does not have insomnia.       PAST MEDICAL HISTORY :  Past Medical History:  Diagnosis Date  . Diabetes mellitus without complication ([place])   . Heart murmur   . History of chemotherapy 07/08/2017  . History of gastric ulcer   . History of radiation therapy 07/08/2017  . Hypercholesterolemia   . Hypertension   . ILD (interstitial lung disease) ([place])    8 yrs ago  . Lymphadenopathy     PAST SURGICAL HISTORY :   Past Surgical History:  Procedure Laterality Date  . BREAST BIOPSY Left 2010   neg  . CATARACT EXTRACTION W/PHACO Left 10/16/2017   Procedure: CATARACT EXTRACTION PHACO AND INTRAOCULAR LENS PLACEMENT ([place]) COMPLICATED DIABETIC LEFT;  Surgeon: Leandrew Koyanagi, MD;  Location: [facility];  Service: Ophthalmology;  Laterality: Left;  IRIS HOOKS Diabetic - oral meds  . HERNIA REPAIR    . PARTIAL HYSTERECTOMY     age 89  . PERIPHERAL VASCULAR CATHETERIZATION N/A 08/22/2016   Procedure: [name];  Surgeon: [name], MD;  Location: [facility];  Service: Cardiovascular;  Laterality: N/A;  . TONSILLECTOMY      FAMILY HISTORY :   Family History  Problem Relation Age of Onset  . Heart disease Mother   . Heart disease Father   . Heart disease Brother     SOCIAL HISTORY:  Social History   Tobacco Use  . Smoking status: Former Smoker    Last attempt to quit: 08/21/1958    Years since quitting: 59.4  . Smokeless tobacco: Never Used  Substance Use Topics  . Alcohol use: No  . Drug use: No    ALLERGIES:  is allergic to levaquin [levofloxacin in d5w]; cefuroxime axetil; and doxycycline.  MEDICATIONS:  Current Outpatient  Medications  Medication Sig Dispense Refill  . b complex vitamins tablet Take 1 tablet by mouth daily.    Marland Kitchen dexamethasone (DECADRON) 4 MG tablet Take 10 pills a day x 4 days; start the day after chemo; EVERY 3 WEEKS. 40 tablet 4  . gabapentin (NEURONTIN) 300 MG capsule Take 1 capsule (300 mg total) by mouth 2 (two) times daily. 180 capsule 4  . latanoprost (XALATAN) 0.005 % ophthalmic solution Place 1 drop into both eyes at bedtime.     . lidocaine-prilocaine (EMLA) cream Apply cream 1 hour before chemotherapy treatment, place small amount of saran wrap over cream to protect clothing. 30 g 1  . metFORMIN (GLUCOPHAGE) 500 MG tablet Take 250 mg by mouth daily with breakfast.     . metoprolol tartrate (LOPRESSOR) 25 MG tablet Take 1 tablet (25 mg total) by mouth 2 (two) times daily. 180 tablet 4  . ondansetron (ZOFRAN) 8 MG tablet One pill every 8 hours as needed for nausea/vomitting. 40 tablet 1  . pravastatin (PRAVACHOL) 40 MG tablet Take 1 tablet by mouth daily.    . prochlorperazine (COMPAZINE) 10 MG tablet Take 1 tablet (10 mg total) by mouth every 6 (six) hours as needed for nausea, vomiting or refractory nausea / vomiting. 40 tablet 0  . sucralfate (CARAFATE) 1 g tablet Take 1/2 tablet 2 times daily. 30 tablet 6  . Magnesium 400 MG TABS Take 1 tablet by mouth daily.     No current facility-administered medications for this visit.    Facility-Administered Medications Ordered in Other Visits  Medication Dose Route Frequency Provider Last Rate Last Dose  . 0.9 %  sodium chloride infusion   Intravenous Continuous Cammie Sickle, MD 10 mL/hr at 12/05/16 1030    . 0.9 %  sodium chloride infusion   Intravenous Continuous Cammie Sickle, MD   Stopped at 12/26/16 1011  . 0.9 %  sodium chloride infusion   Intravenous Continuous Cammie Sickle, MD 10 mL/hr at 12/27/16 0930    . CARBOplatin (PARAPLATIN) 410 mg in sodium chloride 0.9 % 250 mL chemo infusion  410 mg Intravenous Once  Charlaine Dalton R, MD      . gemcitabine (GEMZAR) 1,800 mg in sodium chloride 0.9 % 250 mL chemo infusion  1,800 mg Intravenous Once Cammie Sickle, MD   Stopped at 01/01/18 1115  . heparin lock flush 100 unit/mL  500 Units Intravenous Once Charlaine Dalton R, MD      . heparin lock flush 100 unit/mL  500 Units Intravenous Once Charlaine Dalton R, MD      . heparin lock flush 100 unit/mL  500 Units Intracatheter Once PRN Charlaine Dalton R, MD      . sodium chloride flush (NS) 0.9 % injection 10 mL  10 mL Intravenous PRN Cammie Sickle, MD   10 mL at 11/06/16 1000  . sodium chloride flush (NS) 0.9 % injection 10 mL  10 mL Intravenous PRN Cammie Sickle, MD   10 mL at 11/07/16 0905  . sodium chloride flush (NS) 0.9 % injection 10 mL  10 mL Intravenous PRN Charlaine Dalton R, MD      . sodium chloride flush (NS) 0.9 % injection 10 mL  10 mL Intravenous PRN Cammie Sickle, MD   10 mL at 12/05/16 1030  . sodium chloride flush (NS) 0.9 % injection 10 mL  10 mL Intravenous PRN Cammie Sickle, MD   10 mL at 12/27/16 0930  . sodium chloride flush (NS) 0.9 % injection 10 mL  10 mL Intravenous PRN Cammie Sickle, MD   10 mL at 01/01/18 0831    PHYSICAL EXAMINATION: ECOG PERFORMANCE STATUS: 1 - Symptomatic but completely ambulatory  BP 101/62 (BP Location: Left Arm, Patient Position: Sitting)   Pulse 75   Temp 97.6 F (36.4 C) (Tympanic)   Resp 16   Wt 156 lb (70.8 kg)   BMI 24.43 kg/m   Filed Weights   01/01/18 0904  Weight: 156 lb (70.8 kg)    GENERAL: Well-nourished well-developed; Alert, no distress and comfortable. Accompanied by family.  EYES: no pallor or icterus OROPHARYNX: no thrush or ulceration; NECK: supple; no lymph nodes felt. LYMPH:  no palpable lymphadenopathy in the axillary or inguinal regions LUNGS: Decreased breath sounds auscultation bilaterally. No wheeze or crackles HEART/CVS: regular rate & rhythm and no  murmurs; No lower extremity edema ABDOMEN:abdomen soft, non-tender and normal bowel sounds. No hepatomegaly or splenomegaly.  Musculoskeletal:no cyanosis of digits and no clubbing  PSYCH: alert & oriented x 3 with fluent speech NEURO: no focal motor/sensory deficits SKIN:  no rashes or significant lesions    LABORATORY DATA:  I have reviewed the data as listed    Component Value Date/Time   NA 140 01/01/2018 0835   K 4.2 01/01/2018 0835   K 4.1 07/01/2014 1429   CL 105 01/01/2018 0835   CO2 27 01/01/2018 0835   GLUCOSE 142 (H) 01/01/2018 0835   BUN 12 01/01/2018 0835   CREATININE 0.52 01/01/2018 0835   CALCIUM 9.4 01/01/2018 0835   PROT 6.6 01/01/2018 0835   ALBUMIN 4.0 01/01/2018 0835   AST 23 01/01/2018 0835   ALT 17 01/01/2018 0835   ALKPHOS 77 01/01/2018 0835   BILITOT 0.5 01/01/2018 0835   GFRNONAA >60 01/01/2018 0835   GFRAA >60 01/01/2018 0835    No results found for: SPEP, UPEP  Lab Results  Component Value Date   WBC 6.4 01/01/2018   NEUTROABS 4.2 01/01/2018   HGB 11.6 (L) 01/01/2018   HCT 33.8 (L) 01/01/2018   MCV 91.6 01/01/2018   PLT 217 01/01/2018      Chemistry      Component Value Date/Time   NA 140 01/01/2018 0835   K 4.2 01/01/2018 0835   K 4.1 07/01/2014 1429   CL 105 01/01/2018 0835   CO2 27 01/01/2018 0835   BUN 12 01/01/2018 0835   CREATININE 0.52 01/01/2018 0835      Component Value Date/Time   CALCIUM 9.4 01/01/2018 0835   ALKPHOS 77 01/01/2018 0835   AST 23 01/01/2018 0835   ALT 17 01/01/2018 0835   BILITOT 0.5 01/01/2018 0835       RADIOGRAPHIC STUDIES: I have personally reviewed the radiological images as listed and agreed with the findings in the report. No results found.   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  No evidence of any progression.  # proceed with cycle # 3 day 1 [carbo gem]; Labs today reviewed;  acceptable for treatment today.  Discussed that we will plan total of 4 cycles if proceeding with transplant post treatment.  We will also discuss with transplant physician at [facility].  # Diarrhea-question  Gemcitabine.  Currently improved.  Continue Imodium as needed  #Left hearing difficulty/earache-status post ENT evaluation improved.   #Peripheral neuropathy/foot drop-stable.   # follow up in 3 weeks/MD-labs;Carbo-Gem; 1 week- cbc/bmp- Rituxan-Gem; day-9- udenyca.  # I reviewed the blood work- with the patient in detail; also reviewed the imaging independently [as summarized above]; and with the patient in detail.      Orders Placed This Encounter  Procedures  . CBC with Differential/Platelet    Standing Status:   Future    Standing Expiration Date:   01/02/2019  . CBC with Differential/Platelet    Standing Status:   Future    Standing Expiration Date:   01/02/2019  . Comprehensive metabolic panel    Standing Status:   Future    Standing Expiration Date:   01/02/2019   All questions were answered. The patient knows to call the clinic with any problems, questions or concerns.      Cammie Sickle, MD 01/01/2018 10:51 AM

## 2018-01-08 ENCOUNTER — Inpatient Hospital Stay: Payer: Medicare Other

## 2018-01-08 ENCOUNTER — Inpatient Hospital Stay: Payer: Medicare Other | Admitting: Internal Medicine

## 2018-01-08 DIAGNOSIS — R42 Dizziness and giddiness: Secondary | ICD-10-CM

## 2018-01-08 DIAGNOSIS — I10 Essential (primary) hypertension: Secondary | ICD-10-CM

## 2018-01-08 DIAGNOSIS — R0981 Nasal congestion: Secondary | ICD-10-CM

## 2018-01-08 DIAGNOSIS — E119 Type 2 diabetes mellitus without complications: Secondary | ICD-10-CM

## 2018-01-08 DIAGNOSIS — Z87891 Personal history of nicotine dependence: Secondary | ICD-10-CM

## 2018-01-08 DIAGNOSIS — H9190 Unspecified hearing loss, unspecified ear: Secondary | ICD-10-CM

## 2018-01-08 DIAGNOSIS — Z7984 Long term (current) use of oral hypoglycemic drugs: Secondary | ICD-10-CM

## 2018-01-08 DIAGNOSIS — G629 Polyneuropathy, unspecified: Secondary | ICD-10-CM | POA: Diagnosis not present

## 2018-01-08 DIAGNOSIS — J849 Interstitial pulmonary disease, unspecified: Secondary | ICD-10-CM

## 2018-01-08 DIAGNOSIS — C8333 Diffuse large B-cell lymphoma, intra-abdominal lymph nodes: Secondary | ICD-10-CM

## 2018-01-08 DIAGNOSIS — H9209 Otalgia, unspecified ear: Secondary | ICD-10-CM

## 2018-01-08 DIAGNOSIS — R5381 Other malaise: Secondary | ICD-10-CM | POA: Diagnosis not present

## 2018-01-08 DIAGNOSIS — I959 Hypotension, unspecified: Secondary | ICD-10-CM

## 2018-01-08 DIAGNOSIS — M21379 Foot drop, unspecified foot: Secondary | ICD-10-CM

## 2018-01-08 DIAGNOSIS — Z79899 Other long term (current) drug therapy: Secondary | ICD-10-CM

## 2018-01-08 DIAGNOSIS — R5383 Other fatigue: Secondary | ICD-10-CM

## 2018-01-08 LAB — COMPREHENSIVE METABOLIC PANEL
ALK PHOS: 71 U/L (ref 38–126)
ALT: 16 U/L (ref 14–54)
AST: 20 U/L (ref 15–41)
Albumin: 3.9 g/dL (ref 3.5–5.0)
Anion gap: 9 (ref 5–15)
BILIRUBIN TOTAL: 0.6 mg/dL (ref 0.3–1.2)
BUN: 13 mg/dL (ref 6–20)
CO2: 24 mmol/L (ref 22–32)
CREATININE: 0.41 mg/dL — AB (ref 0.44–1.00)
Calcium: 8.8 mg/dL — ABNORMAL LOW (ref 8.9–10.3)
Chloride: 105 mmol/L (ref 101–111)
GLUCOSE: 144 mg/dL — AB (ref 65–99)
Potassium: 3.3 mmol/L — ABNORMAL LOW (ref 3.5–5.1)
Sodium: 138 mmol/L (ref 135–145)
Total Protein: 6.5 g/dL (ref 6.5–8.1)

## 2018-01-08 LAB — CBC WITH DIFFERENTIAL/PLATELET
Basophils Absolute: 0 10*3/uL (ref 0–0.1)
Basophils Relative: 0 %
EOS PCT: 2 %
Eosinophils Absolute: 0.2 10*3/uL (ref 0–0.7)
HCT: 32.5 % — ABNORMAL LOW (ref 35.0–47.0)
Hemoglobin: 11.5 g/dL — ABNORMAL LOW (ref 12.0–16.0)
LYMPHS ABS: 0.4 10*3/uL — AB (ref 1.0–3.6)
Lymphocytes Relative: 5 %
MCH: 32.1 pg (ref 26.0–34.0)
MCHC: 35.3 g/dL (ref 32.0–36.0)
MCV: 90.7 fL (ref 80.0–100.0)
MONO ABS: 0.8 10*3/uL (ref 0.2–0.9)
Monocytes Relative: 10 %
Neutro Abs: 7.2 10*3/uL — ABNORMAL HIGH (ref 1.4–6.5)
Neutrophils Relative %: 83 %
Platelets: 214 10*3/uL (ref 150–440)
RBC: 3.58 MIL/uL — AB (ref 3.80–5.20)
RDW: 20.2 % — AB (ref 11.5–14.5)
WBC: 8.6 10*3/uL (ref 3.6–11.0)

## 2018-01-08 MED ORDER — PROCHLORPERAZINE MALEATE 10 MG PO TABS
10.0000 mg | ORAL_TABLET | Freq: Once | ORAL | Status: AC
  Administered 2018-01-08: 10 mg via ORAL
  Filled 2018-01-08: qty 1

## 2018-01-08 MED ORDER — ACETAMINOPHEN 325 MG PO TABS
650.0000 mg | ORAL_TABLET | Freq: Once | ORAL | Status: AC
  Administered 2018-01-08: 650 mg via ORAL
  Filled 2018-01-08: qty 2

## 2018-01-08 MED ORDER — MONTELUKAST SODIUM 10 MG PO TABS: 10.0000 mg | ORAL_TABLET | Freq: Every day | ORAL | 3 refills | Status: DC

## 2018-01-08 MED ORDER — SODIUM CHLORIDE 0.9 % IV SOLN
375.0000 mg/m2 | Freq: Once | INTRAVENOUS | Status: AC
  Administered 2018-01-08: 700 mg via INTRAVENOUS
  Filled 2018-01-08 (×2): qty 70

## 2018-01-08 MED ORDER — HEPARIN SOD (PORK) LOCK FLUSH 100 UNIT/ML IV SOLN
500.0000 [IU] | Freq: Once | INTRAVENOUS | Status: AC
  Administered 2018-01-08: 500 [IU] via INTRAVENOUS
  Filled 2018-01-08: qty 5

## 2018-01-08 MED ORDER — SODIUM CHLORIDE 0.9 % IV SOLN
Freq: Once | INTRAVENOUS | Status: AC
  Administered 2018-01-08: 10:00:00 via INTRAVENOUS
  Filled 2018-01-08: qty 1000

## 2018-01-08 MED ORDER — SODIUM CHLORIDE 0.9 % IV SOLN
1800.0000 mg | Freq: Once | INTRAVENOUS | Status: AC
  Administered 2018-01-08: 1800 mg via INTRAVENOUS
  Filled 2018-01-08: qty 26.3

## 2018-01-08 MED ORDER — SODIUM CHLORIDE 0.9% FLUSH
10.0000 mL | Freq: Once | INTRAVENOUS | Status: AC
  Administered 2018-01-08: 10 mL via INTRAVENOUS
  Filled 2018-01-08: qty 10

## 2018-01-08 MED ORDER — SODIUM CHLORIDE 0.9 % IV SOLN: 375.0000 mg/m2 | Freq: Once | INTRAVENOUS | Status: DC

## 2018-01-08 MED ORDER — DIPHENHYDRAMINE HCL 25 MG PO CAPS
50.0000 mg | ORAL_CAPSULE | Freq: Once | ORAL | Status: AC
  Administered 2018-01-08: 50 mg via ORAL
  Filled 2018-01-08: qty 2

## 2018-01-08 NOTE — Progress Notes (Signed)
Patient c/o cold/allergy symptoms x1 week. Patient states she is having left ear pain, sore throat, and headache.

## 2018-01-08 NOTE — Progress Notes (Signed)
[name] OFFICE PROGRESS NOTE  Patient Care Team: Rusty Aus, [name]  Cancer Staging No matching staging information was found for the patient.   Oncology History   DEC 2017- DLBCL "TRIPLE HIT [myc/ bcl-2/bcl-6 gene rearrangement FISH]" ~10 cm mass RP LN; STAGE II [jan 2018- BMBx-NEG]; Jan 8th R-[facility];   # JAN 29th 2018- DA-R EPOCH x5 ; PET CR; s/p RT [last 03/18/2017]  # FEB-MARCH 2019- RECURRENCE of DLBCL [s/p RP Bx; 4 cm mass inferior to Left Kidney] ; II [facility]; Dr.Grover.   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  female pleasant patient above history of relapsed diffuse large B-cell lymphoma is here for follow-up.  Patient complains of left ear pain-difficulty hearing.  Complains of nasal congestion.  Is not on any decongestants/antihistamines.  Complains of increasing fatigue.  Otherwise no fever chills.  Complains of dizziness on standing.  Review of Systems  Constitutional: Positive for malaise/fatigue. Negative for chills, diaphoresis, fever and weight loss.  HENT: Positive for ear pain and hearing loss. Negative for nosebleeds and sore throat.   Eyes: Negative for double vision.  Respiratory: Negative for cough, hemoptysis, sputum production, shortness of breath and wheezing.   Cardiovascular: Negative for chest pain, palpitations, orthopnea and leg swelling.  Gastrointestinal: Negative for abdominal pain, blood in stool, constipation, diarrhea, heartburn, melena, nausea and  vomiting.  Genitourinary: Negative for dysuria, frequency and urgency.  Musculoskeletal: Negative for back pain and joint pain.  Skin: Negative.  Negative for itching and rash.  Neurological: Positive for dizziness and weakness. Negative for tingling, focal weakness and headaches.  Endo/Heme/Allergies: Positive for environmental allergies. Does not bruise/bleed easily.  Psychiatric/Behavioral: Negative for depression. The patient is not nervous/anxious and does not have insomnia.       PAST MEDICAL HISTORY :  Past Medical History:  Diagnosis Date  . Diabetes mellitus without complication ([name])   . Heart murmur   . History of chemotherapy 07/08/2017  . History of gastric ulcer   . History of radiation therapy 07/08/2017  . Hypercholesterolemia   . Hypertension   . ILD (interstitial lung disease) ([name])    8 yrs ago  . Lymphadenopathy     PAST SURGICAL HISTORY :   Past Surgical History:  Procedure Laterality Date  . BREAST BIOPSY Left 2010   neg  . CATARACT EXTRACTION W/PHACO Left 10/16/2017   Procedure: CATARACT EXTRACTION PHACO AND INTRAOCULAR LENS PLACEMENT ([place]) COMPLICATED DIABETIC LEFT;  Surgeon: Leandrew Koyanagi, MD;  Location: [facility];  Service: Ophthalmology;  Laterality: Left;  IRIS HOOKS Diabetic - oral meds  . HERNIA REPAIR    . PARTIAL HYSTERECTOMY     age 83  . PERIPHERAL VASCULAR CATHETERIZATION N/A 08/22/2016   Procedure: [name];  Surgeon: [name], MD;  Location: [facility];  Service: Cardiovascular;  Laterality: N/A;  . TONSILLECTOMY      FAMILY HISTORY :   Family History  Problem Relation Age of Onset  . Heart disease Mother   . Heart disease Father   . Heart disease Brother  SOCIAL HISTORY:   Social History   Tobacco Use  . Smoking status: Former Smoker    Last attempt to quit: 08/21/1958    Years since quitting: 59.4  . Smokeless tobacco: Never Used  Substance Use Topics  . Alcohol use: No  .  Drug use: No    ALLERGIES:  is allergic to levaquin [levofloxacin in d5w]; cefuroxime axetil; and doxycycline.  MEDICATIONS:  Current Outpatient Medications  Medication Sig Dispense Refill  . b complex vitamins tablet Take 1 tablet by mouth daily.    Marland Kitchen dexamethasone (DECADRON) 4 MG tablet Take 10 pills a day x 4 days; start the day after chemo; EVERY 3 WEEKS. 40 tablet 4  . gabapentin (NEURONTIN) 300 MG capsule Take 1 capsule (300 mg total) by mouth 2 (two) times daily. 180 capsule 4  . latanoprost (XALATAN) 0.005 % ophthalmic solution Place 1 drop into both eyes at bedtime.     . lidocaine-prilocaine (EMLA) cream Apply cream 1 hour before chemotherapy treatment, place small amount of saran wrap over cream to protect clothing. 30 g 1  . Magnesium 400 MG TABS Take 1 tablet by mouth daily.    . metFORMIN (GLUCOPHAGE) 500 MG tablet Take 250 mg by mouth daily with breakfast.     . metoprolol tartrate (LOPRESSOR) 25 MG tablet Take 1 tablet (25 mg total) by mouth 2 (two) times daily. 180 tablet 4  . ondansetron (ZOFRAN) 8 MG tablet One pill every 8 hours as needed for nausea/vomitting. 40 tablet 1  . pravastatin (PRAVACHOL) 40 MG tablet Take 1 tablet by mouth daily.    . prochlorperazine (COMPAZINE) 10 MG tablet Take 1 tablet (10 mg total) by mouth every 6 (six) hours as needed for nausea, vomiting or refractory nausea / vomiting. 40 tablet 0  . sucralfate (CARAFATE) 1 g tablet Take 1/2 tablet 2 times daily. 30 tablet 6  . montelukast (SINGULAIR) 10 MG tablet Take 1 tablet (10 mg total) by mouth at bedtime. Start 2 days prior to infusion. Take it for 4 days. 30 tablet 3   No current facility-administered medications for this visit.    Facility-Administered Medications Ordered in Other Visits  Medication Dose Route Frequency Provider Last Rate Last Dose  . 0.9 %  sodium chloride infusion   Intravenous Continuous Cammie Sickle, MD 10 mL/hr at 12/05/16 1030    . 0.9 %  sodium chloride  infusion   Intravenous Continuous Cammie Sickle, MD   Stopped at 12/26/16 1011  . 0.9 %  sodium chloride infusion   Intravenous Continuous Charlaine Dalton R, MD 10 mL/hr at 12/27/16 0930    . heparin lock flush 100 unit/mL  500 Units Intravenous Once Charlaine Dalton R, MD      . heparin lock flush 100 unit/mL  500 Units Intravenous Once Charlaine Dalton R, MD      . sodium chloride flush (NS) 0.9 % injection 10 mL  10 mL Intravenous PRN Cammie Sickle, MD   10 mL at 11/06/16 1000  . sodium chloride flush (NS) 0.9 % injection 10 mL  10 mL Intravenous PRN Cammie Sickle, MD   10 mL at 11/07/16 0905  . sodium chloride flush (NS) 0.9 % injection 10 mL  10 mL Intravenous PRN Charlaine Dalton R, MD      . sodium chloride flush (NS) 0.9 % injection 10 mL  10 mL Intravenous PRN Cammie Sickle, MD   10 mL at 12/05/16 1030  . sodium chloride  flush (NS) 0.9 % injection 10 mL  10 mL Intravenous PRN Cammie Sickle, MD   10 mL at 12/27/16 0930    PHYSICAL EXAMINATION: ECOG PERFORMANCE STATUS: 1 - Symptomatic but completely ambulatory  BP (!) 96/59 (BP Location: Left Arm, Patient Position: Sitting)   Pulse 87   Temp 98.5 F (36.9 C) (Tympanic)   Resp 16   Wt 152 lb (68.9 kg)   BMI 23.81 kg/m   Filed Weights   01/08/18 0856  Weight: 152 lb (68.9 kg)    GENERAL: Well-nourished well-developed; Alert, no distress and comfortable. Accompanied by family.  EYES: no pallor or icterus OROPHARYNX: no thrush or ulceration; left ear-effusion noted. NECK: supple; no lymph nodes felt. LYMPH:  no palpable lymphadenopathy in the axillary or inguinal regions LUNGS: Decreased breath sounds auscultation bilaterally. No wheeze. Basilar crackles.  HEART/CVS: regular rate & rhythm and no murmurs; No lower extremity edema ABDOMEN:abdomen soft, non-tender and normal bowel sounds. No hepatomegaly or splenomegaly.  Musculoskeletal:no cyanosis of digits and no clubbing   PSYCH: alert & oriented x 3 with fluent speech NEURO: no focal motor/sensory deficits SKIN:  no rashes or significant lesions    LABORATORY DATA:  I have reviewed the data as listed    Component Value Date/Time   NA 138 01/08/2018 0848   K 3.3 (L) 01/08/2018 0848   K 4.1 07/01/2014 1429   CL 105 01/08/2018 0848   CO2 24 01/08/2018 0848   GLUCOSE 144 (H) 01/08/2018 0848   BUN 13 01/08/2018 0848   CREATININE 0.41 (L) 01/08/2018 0848   CALCIUM 8.8 (L) 01/08/2018 0848   PROT 6.5 01/08/2018 0848   ALBUMIN 3.9 01/08/2018 0848   AST 20 01/08/2018 0848   ALT 16 01/08/2018 0848   ALKPHOS 71 01/08/2018 0848   BILITOT 0.6 01/08/2018 0848   GFRNONAA >60 01/08/2018 0848   GFRAA >60 01/08/2018 0848    No results found for: SPEP, UPEP  Lab Results  Component Value Date   WBC 8.6 01/08/2018   NEUTROABS 7.2 (H) 01/08/2018   HGB 11.5 (L) 01/08/2018   HCT 32.5 (L) 01/08/2018   MCV 90.7 01/08/2018   PLT 214 01/08/2018      Chemistry      Component Value Date/Time   NA 138 01/08/2018 0848   K 3.3 (L) 01/08/2018 0848   K 4.1 07/01/2014 1429   CL 105 01/08/2018 0848   CO2 24 01/08/2018 0848   BUN 13 01/08/2018 0848   CREATININE 0.41 (L) 01/08/2018 0848      Component Value Date/Time   CALCIUM 8.8 (L) 01/08/2018 0848   ALKPHOS 71 01/08/2018 0848   AST 20 01/08/2018 0848   ALT 16 01/08/2018 0848   BILITOT 0.6 01/08/2018 0848       RADIOGRAPHIC STUDIES: I have personally reviewed the radiological images as listed and agreed with the findings in the report. No results found.   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   # proceed with cycle # 3 day 15 [Ritux-Gem]; Labs today reviewed;  acceptable for treatment today.  Discussed with BMT RN-plan is to finish total of 4 cycles of chemotherapy  followed by repeat imaging-prior to consideration of transplant.  # relative Hypotension- 99/59; NEW;  hold off metoprolol for now. Check BP at home; to call if elevated.  Do not suspect  sepsis.  #Left hearing difficulty/earache-claritin/ sudafed; singulair   #Peripheral neuropathy/foot drop-stable.  Monitor closely.  # follow up in 2 weeks/MD-labs;Carbo-Gem; 3 week- cbc/bmp- Rituxan-Gem; day-9- udenyca.   Orders Placed This Encounter  Procedures  . CBC with Differential    Standing Status:   Future    Standing Expiration Date:   01/08/2019  . Comprehensive metabolic panel    Standing Status:   Future    Standing Expiration Date:   01/08/2019  . CBC with Differential    Standing Status:   Future    Standing Expiration Date:   01/08/2019  . Basic metabolic panel    Standing Status:   Future    Standing Expiration Date:   01/08/2019   All questions were answered. The patient knows to call the clinic with any problems, questions or concerns.      Cammie Sickle, MD 01/08/2018 5:38 PM

## 2018-01-08 NOTE — Assessment & Plan Note (Addendum)
RECURRENT DLBCL-  triple hit diffuse large B-cell lymphoma [ March 2019] currently on RGDP [carboplatin]-MAY 13th PET -PR/ improved.   # proceed with cycle # 3 day 15 [Ritux-Gem]; Labs today reviewed;  acceptable for treatment today.  Discussed with BMT RN-plan is to finish total of 4 cycles of chemotherapy followed by repeat imaging-prior to consideration of transplant.  # relative Hypotension- 99/59; NEW;  hold off metoprolol for now. Check BP at home; to call if elevated.  Do not suspect sepsis.  #Left hearing difficulty/earache-claritin/ sudafed; singulair   #Peripheral neuropathy/foot drop-stable.  Monitor closely.  # follow up in 2 weeks/MD-labs;Carbo-Gem; 3 week- cbc/bmp- Rituxan-Gem; day-9- udenyca.

## 2018-01-08 NOTE — Patient Instructions (Signed)
#  Recommend Claritin once a day #Recommend Sudafed once a day #Recommend Singulair once a day.

## 2018-01-09 ENCOUNTER — Inpatient Hospital Stay: Payer: Medicare Other

## 2018-01-09 DIAGNOSIS — C8333 Diffuse large B-cell lymphoma, intra-abdominal lymph nodes: Secondary | ICD-10-CM

## 2018-01-09 MED ORDER — PEGFILGRASTIM-CBQV 6 MG/0.6ML ~~LOC~~ SOSY
6.0000 mg | PREFILLED_SYRINGE | Freq: Once | SUBCUTANEOUS | Status: AC
  Administered 2018-01-09: 6 mg via SUBCUTANEOUS
  Filled 2018-01-09: qty 0.6

## 2018-01-12 ENCOUNTER — Encounter: Payer: Self-pay | Admitting: *Deleted

## 2018-01-12 ENCOUNTER — Inpatient Hospital Stay
Admission: EM | Admit: 2018-01-12 | Discharge: 2018-01-14 | DRG: 872 | Disposition: A | Discharge status: Home / Self Care | Payer: Medicare Other | Attending: Internal Medicine | Admitting: Internal Medicine

## 2018-01-12 ENCOUNTER — Other Ambulatory Visit: Payer: Self-pay

## 2018-01-12 ENCOUNTER — Emergency Department: Payer: Medicare Other

## 2018-01-12 DIAGNOSIS — Z8249 Family history of ischemic heart disease and other diseases of the circulatory system: Secondary | ICD-10-CM | POA: Diagnosis not present

## 2018-01-12 DIAGNOSIS — G4733 Obstructive sleep apnea (adult) (pediatric): Secondary | ICD-10-CM | POA: Diagnosis present

## 2018-01-12 DIAGNOSIS — Z9221 Personal history of antineoplastic chemotherapy: Secondary | ICD-10-CM

## 2018-01-12 DIAGNOSIS — Z881 Allergy status to other antibiotic agents status: Secondary | ICD-10-CM

## 2018-01-12 DIAGNOSIS — J309 Allergic rhinitis, unspecified: Secondary | ICD-10-CM | POA: Diagnosis present

## 2018-01-12 DIAGNOSIS — H409 Unspecified glaucoma: Secondary | ICD-10-CM | POA: Diagnosis present

## 2018-01-12 DIAGNOSIS — Z961 Presence of intraocular lens: Secondary | ICD-10-CM | POA: Diagnosis present

## 2018-01-12 DIAGNOSIS — E785 Hyperlipidemia, unspecified: Secondary | ICD-10-CM | POA: Diagnosis present

## 2018-01-12 DIAGNOSIS — I10 Essential (primary) hypertension: Secondary | ICD-10-CM | POA: Diagnosis present

## 2018-01-12 DIAGNOSIS — B9620 Unspecified Escherichia coli [E. coli] as the cause of diseases classified elsewhere: Secondary | ICD-10-CM | POA: Diagnosis present

## 2018-01-12 DIAGNOSIS — Z87891 Personal history of nicotine dependence: Secondary | ICD-10-CM | POA: Diagnosis not present

## 2018-01-12 DIAGNOSIS — Z90710 Acquired absence of both cervix and uterus: Secondary | ICD-10-CM

## 2018-01-12 DIAGNOSIS — Z9842 Cataract extraction status, left eye: Secondary | ICD-10-CM

## 2018-01-12 DIAGNOSIS — N3000 Acute cystitis without hematuria: Secondary | ICD-10-CM | POA: Diagnosis present

## 2018-01-12 DIAGNOSIS — Z79899 Other long term (current) drug therapy: Secondary | ICD-10-CM

## 2018-01-12 DIAGNOSIS — Z8711 Personal history of peptic ulcer disease: Secondary | ICD-10-CM

## 2018-01-12 DIAGNOSIS — J8410 Pulmonary fibrosis, unspecified: Secondary | ICD-10-CM | POA: Diagnosis present

## 2018-01-12 DIAGNOSIS — Z923 Personal history of irradiation: Secondary | ICD-10-CM | POA: Diagnosis not present

## 2018-01-12 DIAGNOSIS — E1140 Type 2 diabetes mellitus with diabetic neuropathy, unspecified: Secondary | ICD-10-CM | POA: Diagnosis present

## 2018-01-12 DIAGNOSIS — A419 Sepsis, unspecified organism: Secondary | ICD-10-CM | POA: Diagnosis present

## 2018-01-12 DIAGNOSIS — N390 Urinary tract infection, site not specified: Secondary | ICD-10-CM

## 2018-01-12 DIAGNOSIS — Z7984 Long term (current) use of oral hypoglycemic drugs: Secondary | ICD-10-CM | POA: Diagnosis not present

## 2018-01-12 LAB — CBC WITH DIFFERENTIAL/PLATELET
BAND NEUTROPHILS: 4 %
BASOS ABS: 0 10*3/uL (ref 0–0.1)
BLASTS: 0 %
Basophils Relative: 0 %
EOS PCT: 0 %
Eosinophils Absolute: 0 10*3/uL (ref 0–0.7)
HCT: 31.8 % — ABNORMAL LOW (ref 35.0–47.0)
Hemoglobin: 10.8 g/dL — ABNORMAL LOW (ref 12.0–16.0)
LYMPHS ABS: 0.7 10*3/uL — AB (ref 1.0–3.6)
Lymphocytes Relative: 4 %
MCH: 31.6 pg (ref 26.0–34.0)
MCHC: 33.9 g/dL (ref 32.0–36.0)
MCV: 93.1 fL (ref 80.0–100.0)
METAMYELOCYTES PCT: 1 %
MYELOCYTES: 1 %
Monocytes Absolute: 0.8 10*3/uL (ref 0.2–0.9)
Monocytes Relative: 5 %
Neutro Abs: 14.9 10*3/uL — ABNORMAL HIGH (ref 1.4–6.5)
Neutrophils Relative %: 85 %
Other: 0 %
PLATELETS: 68 10*3/uL — AB (ref 150–440)
Promyelocytes Relative: 0 %
RBC: 3.42 MIL/uL — AB (ref 3.80–5.20)
RDW: 21.4 % — AB (ref 11.5–14.5)
WBC: 16.4 10*3/uL — AB (ref 3.6–11.0)
nRBC: 0 /100 WBC

## 2018-01-12 LAB — URINALYSIS, ROUTINE W REFLEX MICROSCOPIC
BILIRUBIN URINE: NEGATIVE
GLUCOSE, UA: NEGATIVE mg/dL
HGB URINE DIPSTICK: NEGATIVE
Ketones, ur: 5 mg/dL — AB
Nitrite: POSITIVE — AB
PH: 6 (ref 5.0–8.0)
Protein, ur: 100 mg/dL — AB
SPECIFIC GRAVITY, URINE: 1.024 (ref 1.005–1.030)
WBC, UA: >50 WBC/hpf — ABNORMAL HIGH (ref 0–5)

## 2018-01-12 LAB — COMPREHENSIVE METABOLIC PANEL
ALK PHOS: 136 U/L — AB (ref 38–126)
ALT: 34 U/L (ref 14–54)
AST: 29 U/L (ref 15–41)
Albumin: 3.9 g/dL (ref 3.5–5.0)
Anion gap: 9 (ref 5–15)
BUN: 9 mg/dL (ref 6–20)
CALCIUM: 8.8 mg/dL — AB (ref 8.9–10.3)
CO2: 25 mmol/L (ref 22–32)
Chloride: 99 mmol/L — ABNORMAL LOW (ref 101–111)
Creatinine, Ser: 0.48 mg/dL (ref 0.44–1.00)
Glucose, Bld: 164 mg/dL — ABNORMAL HIGH (ref 65–99)
Potassium: 3.7 mmol/L (ref 3.5–5.1)
Sodium: 133 mmol/L — ABNORMAL LOW (ref 135–145)
Total Bilirubin: 0.8 mg/dL (ref 0.3–1.2)
Total Protein: 6.8 g/dL (ref 6.5–8.1)

## 2018-01-12 LAB — GLUCOSE, CAPILLARY: GLUCOSE-CAPILLARY: 148 mg/dL — AB (ref 65–99)

## 2018-01-12 LAB — LACTIC ACID, PLASMA
Lactic Acid, Venous: 1.3 mmol/L (ref 0.5–1.9)
Lactic Acid, Venous: 2.6 mmol/L (ref 0.5–1.9)

## 2018-01-12 LAB — TROPONIN I: Troponin I: <0.03 ng/mL (ref <0.03)

## 2018-01-12 MED ORDER — GABAPENTIN 300 MG PO CAPS
300.0000 mg | ORAL_CAPSULE | Freq: Two times a day (BID) | ORAL | Status: DC
  Administered 2018-01-13 – 2018-01-14 (×4): 300 mg via ORAL
  Filled 2018-01-12 (×4): qty 1

## 2018-01-12 MED ORDER — ONDANSETRON HCL 4 MG PO TABS: 4.0000 mg | ORAL_TABLET | Freq: Four times a day (QID) | ORAL | Status: DC | PRN

## 2018-01-12 MED ORDER — ONDANSETRON HCL 4 MG/2ML IJ SOLN: 4.0000 mg | Freq: Four times a day (QID) | INTRAMUSCULAR | Status: DC | PRN

## 2018-01-12 MED ORDER — INSULIN ASPART 100 UNIT/ML ~~LOC~~ SOLN: 0.0000 [IU] | Freq: Every day | SUBCUTANEOUS | Status: DC

## 2018-01-12 MED ORDER — PRAVASTATIN SODIUM 20 MG PO TABS
40.0000 mg | ORAL_TABLET | Freq: Every day | ORAL | Status: DC
  Administered 2018-01-13: 40 mg via ORAL
  Filled 2018-01-12: qty 2

## 2018-01-12 MED ORDER — VANCOMYCIN HCL 10 G IV SOLR
1500.0000 mg | INTRAVENOUS | Status: DC
  Administered 2018-01-13: 1500 mg via INTRAVENOUS
  Filled 2018-01-12: qty 1500

## 2018-01-12 MED ORDER — PIPERACILLIN-TAZOBACTAM 3.375 G IVPB
3.3750 g | Freq: Three times a day (TID) | INTRAVENOUS | Status: DC
  Administered 2018-01-13 – 2018-01-14 (×4): 3.375 g via INTRAVENOUS
  Filled 2018-01-12 (×4): qty 50

## 2018-01-12 MED ORDER — HEPARIN SODIUM (PORCINE) 5000 UNIT/ML IJ SOLN
5000.0000 [IU] | Freq: Two times a day (BID) | INTRAMUSCULAR | Status: DC
  Administered 2018-01-13 (×2): 5000 [IU] via SUBCUTANEOUS
  Filled 2018-01-12 (×3): qty 1

## 2018-01-12 MED ORDER — SODIUM CHLORIDE 0.9 % IV BOLUS
1000.0000 mL | Freq: Once | INTRAVENOUS | Status: AC
  Administered 2018-01-12: 1000 mL via INTRAVENOUS

## 2018-01-12 MED ORDER — METOPROLOL TARTRATE 25 MG PO TABS
25.0000 mg | ORAL_TABLET | Freq: Two times a day (BID) | ORAL | Status: DC
  Administered 2018-01-13 – 2018-01-14 (×3): 25 mg via ORAL
  Filled 2018-01-12 (×4): qty 1

## 2018-01-12 MED ORDER — SUCRALFATE 1 G PO TABS
1.0000 g | ORAL_TABLET | Freq: Two times a day (BID) | ORAL | Status: DC
  Administered 2018-01-13 – 2018-01-14 (×3): 1 g via ORAL
  Filled 2018-01-12 (×4): qty 1

## 2018-01-12 MED ORDER — BISACODYL 5 MG PO TBEC: 5.0000 mg | DELAYED_RELEASE_TABLET | Freq: Every day | ORAL | Status: DC | PRN

## 2018-01-12 MED ORDER — VANCOMYCIN HCL IN DEXTROSE 1-5 GM/200ML-% IV SOLN
1000.0000 mg | Freq: Once | INTRAVENOUS | Status: AC
  Administered 2018-01-12: 1000 mg via INTRAVENOUS
  Filled 2018-01-12: qty 200

## 2018-01-12 MED ORDER — HYDROCODONE-ACETAMINOPHEN 5-325 MG PO TABS
1.0000 | ORAL_TABLET | ORAL | Status: DC | PRN
  Administered 2018-01-13: 1 via ORAL
  Filled 2018-01-12: qty 1

## 2018-01-12 MED ORDER — LATANOPROST 0.005 % OP SOLN
1.0000 [drp] | Freq: Every day | OPHTHALMIC | Status: DC
  Administered 2018-01-13: 1 [drp] via OPHTHALMIC
  Filled 2018-01-12: qty 2.5

## 2018-01-12 MED ORDER — DOCUSATE SODIUM 100 MG PO CAPS
100.0000 mg | ORAL_CAPSULE | Freq: Two times a day (BID) | ORAL | Status: DC
  Administered 2018-01-13 – 2018-01-14 (×4): 100 mg via ORAL
  Filled 2018-01-12 (×4): qty 1

## 2018-01-12 MED ORDER — VANCOMYCIN HCL 10 G IV SOLR
1500.0000 mg | INTRAVENOUS | Status: DC
  Filled 2018-01-12: qty 1500

## 2018-01-12 MED ORDER — ACETAMINOPHEN 325 MG PO TABS
650.0000 mg | ORAL_TABLET | Freq: Four times a day (QID) | ORAL | Status: DC | PRN
  Administered 2018-01-13 (×2): 650 mg via ORAL
  Filled 2018-01-12 (×2): qty 2

## 2018-01-12 MED ORDER — MONTELUKAST SODIUM 10 MG PO TABS
10.0000 mg | ORAL_TABLET | Freq: Every day | ORAL | Status: DC
  Administered 2018-01-13 (×2): 10 mg via ORAL
  Filled 2018-01-12 (×2): qty 1

## 2018-01-12 MED ORDER — INSULIN ASPART 100 UNIT/ML ~~LOC~~ SOLN
0.0000 [IU] | Freq: Three times a day (TID) | SUBCUTANEOUS | Status: DC
  Administered 2018-01-13: 09:00:00 1 [IU] via SUBCUTANEOUS
  Filled 2018-01-12: qty 1

## 2018-01-12 MED ORDER — ACETAMINOPHEN 650 MG RE SUPP: 650.0000 mg | Freq: Four times a day (QID) | RECTAL | Status: DC | PRN

## 2018-01-12 MED ORDER — PIPERACILLIN-TAZOBACTAM 3.375 G IVPB
INTRAVENOUS | Status: AC
  Filled 2018-01-12: qty 50

## 2018-01-12 MED ORDER — PIPERACILLIN-TAZOBACTAM 3.375 G IVPB 30 MIN
3.3750 g | Freq: Once | INTRAVENOUS | Status: AC
  Administered 2018-01-12: 3.375 g via INTRAVENOUS

## 2018-01-12 MED ORDER — TRAZODONE HCL 50 MG PO TABS
25.0000 mg | ORAL_TABLET | Freq: Every evening | ORAL | Status: DC | PRN
  Administered 2018-01-13 (×2): 25 mg via ORAL
  Filled 2018-01-12 (×2): qty 1

## 2018-01-12 MED ORDER — SODIUM CHLORIDE 0.9 % IV SOLN
INTRAVENOUS | Status: DC
  Administered 2018-01-12: via INTRAVENOUS

## 2018-01-12 MED ORDER — MAGNESIUM OXIDE 400 (241.3 MG) MG PO TABS
400.0000 mg | ORAL_TABLET | Freq: Every day | ORAL | Status: DC
  Administered 2018-01-13 – 2018-01-14 (×2): 400 mg via ORAL
  Filled 2018-01-12 (×2): qty 1

## 2018-01-12 MED ORDER — B COMPLEX PO TABS: 1.0000 | ORAL_TABLET | Freq: Every day | ORAL | Status: DC

## 2018-01-12 NOTE — Progress Notes (Addendum)
Pharmacy Antibiotic Note  Rose Hill is a 72 y.o. female admitted on 01/12/2018 with sepsis.  Pharmacy has been consulted for vancomycin dosing.  Plan: Vancomycin 1500mg  IV every 24 hours.  Goal trough 15-20 mcg/mL. Zosyn 3.375gm iv q8h extended interval dosing   Weight: 152 lb (68.9 kg)  Temp (24hrs), Avg:99.6 F (37.6 C), Min:99.6 F (37.6 C), Max:99.6 F (37.6 C)  Recent Labs  Lab 01/08/18 0848 01/12/18 1952  WBC 8.6 16.4*  CREATININE 0.41* 0.48  LATICACIDVEN  --  1.3    Estimated Creatinine Clearance: 63.6 mL/min (by C-G formula based on SCr of 0.48 mg/dL).    Allergies  Allergen Reactions  . Levaquin [Levofloxacin In D5w] Palpitations and Other (See Comments)    Burning in lower legs  . Cefuroxime Axetil Diarrhea    Other reaction(s): Abdominal Pain  . Doxycycline     Other reaction(s): Abdominal Pain severe    Antimicrobials this admission: Anti-infectives (From admission, onward)   Start     Dose/Rate Route Frequency Ordered Stop   01/13/18 0500  piperacillin-tazobactam (ZOSYN) IVPB 3.375 g     3.375 g 12.5 mL/hr over 240 Minutes Intravenous Every 8 hours 01/12/18 2139     01/13/18 0300  vancomycin (VANCOCIN) 1,500 mg in sodium chloride 0.9 % 500 mL IVPB     1,500 mg 250 mL/hr over 120 Minutes Intravenous Every 24 hours 01/12/18 2140     01/12/18 2100  vancomycin (VANCOCIN) IVPB 1000 mg/200 mL premix     1,000 mg 200 mL/hr over 60 Minutes Intravenous  Once 01/12/18 2050     01/12/18 2100  piperacillin-tazobactam (ZOSYN) IVPB 3.375 g     3.375 g 100 mL/hr over 30 Minutes Intravenous  Once 01/12/18 2050 01/12/18 2141   01/12/18 2041  piperacillin-tazobactam (ZOSYN) 3.375 GM/50ML IVPB    Note to Pharmacy:  Rose Hill : cabinet override      01/12/18 2041 01/13/18 0859       Microbiology results: No results found for this or any previous visit (from the past 240 hour(s)).   Thank you for allowing pharmacy to be a part of this patient's  care.  Rose Hill 01/12/2018 9:41 PM

## 2018-01-12 NOTE — ED Provider Notes (Signed)
[facility] Emergency Department Provider Note  Time seen: 8:08 PM  I have reviewed the triage vital signs and the nursing notes.   HISTORY  Chief Complaint Nausea    HPI Rose Hill is a 72 y.o. female with a past medical history of diabetes, hypertension, hyperlipidemia, presents to the emergency department for fever.  According to the patient for the past 6 days or so she has been having cough and congestion.  She states tonight she noted a fever so she came to the emergency department.  Patient is a cancer patient, currently on chemotherapy, last dose of chemotherapy was Wednesday and had a Neulasta injection on Thursday.  Patient denies any abdominal pain, current nausea vomiting or diarrhea.  States mild dysuria on review of systems.  Denies any chest pain or current trouble breathing.   Past Medical History:  Diagnosis Date  . Diabetes mellitus without complication ([place])   . Heart murmur   . History of chemotherapy 07/08/2017  . History of gastric ulcer   . History of radiation therapy 07/08/2017  . Hypercholesterolemia   . Hypertension   . ILD (interstitial lung disease) ([place])    8 yrs ago  . Lymphadenopathy     Patient Active Problem List   Diagnosis Date Noted  . Dehydration 12/28/2016  . Encounter for monitoring cardiotoxic drug therapy 10/22/2016  . Protein-calorie malnutrition, severe 09/18/2016  . 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 Abdominal mass 07/31/2016  . DM 10/20/2008  . HYPERLIPIDEMIA 10/20/2008  . OBSTRUCTIVE SLEEP APNEA 10/20/2008  . HYPERTENSION 10/20/2008  . ALLERGIC RHINITIS 10/20/2008  . 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 BREAST BIOPSY Left 2010   neg  . CATARACT EXTRACTION W/PHACO Left 10/16/2017   Procedure: CATARACT EXTRACTION PHACO AND INTRAOCULAR LENS PLACEMENT ([place]) COMPLICATED DIABETIC LEFT;  Surgeon: Leandrew Koyanagi, MD;  Location: [facility];  Service: Ophthalmology;  Laterality: Left;  IRIS HOOKS Diabetic - oral meds  . HERNIA REPAIR    . PARTIAL HYSTERECTOMY     age 13  . PERIPHERAL VASCULAR CATHETERIZATION N/A 08/22/2016   Procedure: [name];  Surgeon: [name], MD;  Location: [facility];  Service: Cardiovascular;  Laterality: N/A;  . TONSILLECTOMY      Prior to Admission medications   Medication Sig Start Date End Date Taking? Authorizing Provider  b complex vitamins tablet Take 1 tablet by mouth daily.    [provider]  dexamethasone (DECADRON) 4 MG tablet Take 10 pills a day x 4 days; start the day after chemo; EVERY 3 WEEKS. 11/11/17   Cammie Sickle, MD  gabapentin (NEURONTIN) 300 MG capsule Take 1 capsule (300 mg total) by mouth 2 (two) times daily. 03/18/17   Cammie Sickle, MD  latanoprost (XALATAN) 0.005 % ophthalmic solution Place 1 drop into both eyes at bedtime.  01/21/16   [provider]  lidocaine-prilocaine (EMLA) cream Apply cream 1 hour before chemotherapy treatment, place small amount of saran wrap over cream to protect clothing. 08/16/16   Sindy Guadeloupe, MD  Magnesium 400 MG TABS Take 1 tablet by mouth daily.    [provider]  metFORMIN (GLUCOPHAGE) 500 MG tablet Take 250 mg by mouth daily with breakfast.  04/03/16   [provider]  metoprolol tartrate (LOPRESSOR) 25 MG tablet Take 1 tablet (25 mg total) by mouth 2 (two) times daily. 03/18/17  Cammie Sickle, MD  montelukast (SINGULAIR) 10 MG tablet Take 1 tablet (10 mg total) by mouth at bedtime. Start 2 days prior to infusion. Take it for 4 days. 01/08/18   Cammie Sickle, MD  ondansetron (ZOFRAN) 8 MG tablet One pill every 8 hours as needed for nausea/vomitting. 11/11/17   Cammie Sickle, MD  pravastatin (PRAVACHOL) 40 MG tablet Take 1 tablet by mouth daily. 04/03/16   [provider]  prochlorperazine  (COMPAZINE) 10 MG tablet Take 1 tablet (10 mg total) by mouth every 6 (six) hours as needed for nausea, vomiting or refractory nausea / vomiting. 11/11/17   Cammie Sickle, MD  sucralfate (CARAFATE) 1 g tablet Take 1/2 tablet 2 times daily. 10/23/17   Noreene Filbert, MD    Allergies  Allergen Reactions  . Levaquin [Levofloxacin In D5w] Palpitations and Other (See Comments)    Burning in lower legs  . Cefuroxime Axetil Diarrhea    Other reaction(s): Abdominal Pain  . Doxycycline     Other reaction(s): Abdominal Pain severe    Family History  Problem Relation Age of Onset  . Heart disease Mother   . Heart disease Father   . Heart disease Brother     Social History Social History   Tobacco Use  . Smoking status: Former Smoker    Last attempt to quit: 08/21/1958    Years since quitting: 59.4  . Smokeless tobacco: Never Used  Substance Use Topics  . Alcohol use: No  . Drug use: No    Review of Systems Constitutional: Positive for fever today. Eyes: Negative for visual complaints ENT: Mild congestion Cardiovascular: Negative for chest pain. Respiratory: Negative shortness of breath.  Positive for cough.  Positive for chest congestion. Gastrointestinal: Negative for abdominal pain, vomiting and diarrhea. Genitourinary: Slight dysuria per patient Musculoskeletal: Negative for musculoskeletal complaints Skin: Negative for skin complaints  Neurological: Negative for headache All other ROS negative  ____________________________________________   PHYSICAL EXAM:  VITAL SIGNS: ED Triage Vitals [01/12/18 1941]  Enc Vitals Group     BP      Pulse      Resp      Temp 99.6 F (37.6 C)     Temp Source Oral     SpO2      Weight 152 lb (68.9 kg)     Height      Head Circumference      Peak Flow      Pain Score      Pain Loc      Pain Edu?      Excl. in [place]?     Constitutional: Alert and oriented. Well appearing and in no distress. Eyes: Normal exam ENT    Head: Normocephalic and atraumatic.   Mouth/Throat: Mucous membranes are moist. Cardiovascular: Normal rate, regular rhythm 100 bpm.  No murmur. Respiratory: Normal respiratory effort without tachypnea nor retractions. Breath sounds are clear  Gastrointestinal: Soft and nontender. No distention.   Musculoskeletal: Nontender with normal range of motion in all extremities.  Neurologic:  Normal speech and language. No gross focal neurologic deficits  Skin:  Skin is warm, dry and intact.  Psychiatric: Mood and affect are normal. Speech and behavior are normal.   ____________________________________________   RADIOLOGY  X-ray negative  ____________________________________________   INITIAL IMPRESSION / ASSESSMENT AND PLAN / ED COURSE  Pertinent labs & imaging results that were available during my care of the patient were reviewed by me and considered in my medical decision  making (see chart for details).  Patient presents to the emergency department for fever cough and congestion.  Differential would include neutropenic fever, pneumonia, urinary tract infection, other infectious etiology, viral upper respiratory infection.  We will check labs, cultures, lactic acid.  We will obtain a chest x-ray, urinalysis and continue to closely monitor.  Patient's urinalysis shows a significant urinary tract infection.  Has a white count of 16,000.  This could be related to sepsis versus Neulasta related.  We will admit to the hospital service for continued IV antibiotics.  Patient agreeable with this plan of care.   ____________________________________________   FINAL CLINICAL IMPRESSION(S) / ED DIAGNOSES  Fever Urinary tract infection Sepsis    Harvest Dark, MD 01/12/18 2106

## 2018-01-12 NOTE — Progress Notes (Signed)
CODE SEPSIS - PHARMACY COMMUNICATION  **Broad Spectrum Antibiotics should be administered within 1 hour of Sepsis diagnosis**  Time Code Sepsis Called/Page Received: 2002  Antibiotics Ordered: 2010  Time of 1st antibiotic administration:  Antibiotics Given (last 72 hours)     Date/Time Action Medication Dose Rate   01/12/18 2051 New Bag/Given   piperacillin-tazobactam (ZOSYN) IVPB 3.375 g 3.375 g 100 mL/hr        Additional action taken by pharmacy: None   If necessary, Name of Provider/Nurse Contacted: none   Thomasenia Sales, PharmD, MBA, BCGP Clinical Pharmacist 01/12/2018  9:21 PM

## 2018-01-12 NOTE — H&P (Signed)
[facility] NAME: Rose Hill    MR#:  [id number]  DATE OF BIRTH:  October 23, 1945  DATE OF ADMISSION:  01/12/2018  PRIMARY CARE PHYSICIAN: Rusty Aus, MD   REQUESTING/REFERRING PHYSICIAN:   CHIEF COMPLAINT:   Chief Complaint  Patient presents with  . Nausea    HISTORY OF PRESENT ILLNESS: Rose Hill  is a 72 y.o. female with a known history of interstitial lung disease, diabetes type 2 and diffuse large B-cell lymphoma of intra-abdominal lymph nodes.  Patient is currently undergoing chemotherapy,  most recent cycle was 4 days ago. Patient presented to emergency room for fever, one 101.2 at home.  On detailed questioning, patient admits to intermittent dry cough and chest congestion, going on for the past week or so.  She denies any chest/abdominal pain, shortness of breath, nausea, vomiting, diarrhea.  No bleeding.  She has been complaining of some occasional burning with urination in the past 2 to 3 days. At the arrival to emergency room, patient was noted to be tachycardic and tachypneic.  Blood pressure was 96/59.  Temperature is 99.6 status post Tylenol. Blood test done emergency room are notable for WBC at 16.4, hemoglobin level at 10.8, sodium level at 133.  Lactic acid is 2.6.  UA is positive for UTI.  Chest x-ray, reviewed by myself, shows chronic bibasilar fibrosis, but no infiltrate. Patient is admitted for further evaluation and treatment.  PAST MEDICAL HISTORY:   Past Medical History:  Diagnosis Date  . Diabetes mellitus without complication ([place])   . Heart murmur   . History of chemotherapy 07/08/2017  . History of gastric ulcer   . History of radiation therapy 07/08/2017  . Hypercholesterolemia   . Hypertension   . ILD (interstitial lung disease) ([place])    8 yrs ago  . Lymphadenopathy     PAST SURGICAL HISTORY:  Past Surgical History:  Procedure Laterality Date  . BREAST BIOPSY Left 2010   neg  .  CATARACT EXTRACTION W/PHACO Left 10/16/2017   Procedure: CATARACT EXTRACTION PHACO AND INTRAOCULAR LENS PLACEMENT ([place]) COMPLICATED DIABETIC LEFT;  Surgeon: Leandrew Koyanagi, MD;  Location: [facility];  Service: Ophthalmology;  Laterality: Left;  IRIS HOOKS Diabetic - oral meds  . HERNIA REPAIR    . PARTIAL HYSTERECTOMY     age 8  . PERIPHERAL VASCULAR CATHETERIZATION N/A 08/22/2016   Procedure: [name];  Surgeon: [name], MD;  Location: [facility];  Service: Cardiovascular;  Laterality: N/A;  . TONSILLECTOMY      SOCIAL HISTORY:  Social History   Tobacco Use  . Smoking status: Former Smoker    Last attempt to quit: 08/21/1958    Years since quitting: 59.4  . Smokeless tobacco: Never Used  Substance Use Topics  . Alcohol use: No    FAMILY HISTORY:  Family History  Problem Relation Age of Onset  . Heart disease Mother   . Heart disease Father   . Heart disease Brother     DRUG ALLERGIES:  Allergies  Allergen Reactions  . Levaquin [Levofloxacin In D5w] Palpitations and Other (See Comments)    Burning in lower legs  . Cefuroxime Axetil Diarrhea    Other reaction(s): Abdominal Pain  . Doxycycline     Other reaction(s): Abdominal Pain severe    REVIEW OF SYSTEMS:   CONSTITUTIONAL: Positive for fever, fatigue and generalized weakness.  EYES: No blurred or double vision.  EARS, NOSE, AND  THROAT: No tinnitus or ear pain.  RESPIRATORY: Positive for occasional dry cough.  No shortness of breath, wheezing or hemoptysis.  CARDIOVASCULAR: No chest pain, orthopnea, edema.  GASTROINTESTINAL: No nausea, vomiting, diarrhea or abdominal pain.  GENITOURINARY: Positive for dysuria, no hematuria.  ENDOCRINE: No polyuria, nocturia,  HEMATOLOGY: Positive for recurrent diffuse large B-cell lymphoma. SKIN: No rash or lesion. MUSCULOSKELETAL: No joint pain at this time.   NEUROLOGIC: No focal weakness.  PSYCHIATRY: No anxiety or depression.    MEDICATIONS AT HOME:  Prior to Admission medications   Medication Sig Start Date End Date Taking? Authorizing Provider  b complex vitamins tablet Take 1 tablet by mouth daily.   Yes [provider]  gabapentin (NEURONTIN) 300 MG capsule Take 1 capsule (300 mg total) by mouth 2 (two) times daily. 03/18/17  Yes Cammie Sickle, MD  latanoprost (XALATAN) 0.005 % ophthalmic solution Place 1 drop into both eyes at bedtime.  01/21/16  Yes [provider]  lidocaine-prilocaine (EMLA) cream Apply cream 1 hour before chemotherapy treatment, place small amount of saran wrap over cream to protect clothing. 08/16/16  Yes Sindy Guadeloupe, MD  Magnesium 400 MG TABS Take 1 tablet by mouth daily.   Yes [provider]  metFORMIN (GLUCOPHAGE) 500 MG tablet Take 250 mg by mouth daily with breakfast.  04/03/16  Yes [provider]  metoprolol tartrate (LOPRESSOR) 25 MG tablet Take 1 tablet (25 mg total) by mouth 2 (two) times daily. 03/18/17  Yes Cammie Sickle, MD  ondansetron (ZOFRAN) 8 MG tablet One pill every 8 hours as needed for nausea/vomitting. 11/11/17  Yes Cammie Sickle, MD  pravastatin (PRAVACHOL) 40 MG tablet Take 1 tablet by mouth daily. 04/03/16  Yes [provider]  prochlorperazine (COMPAZINE) 10 MG tablet Take 1 tablet (10 mg total) by mouth every 6 (six) hours as needed for nausea, vomiting or refractory nausea / vomiting. 11/11/17  Yes Cammie Sickle, MD  dexamethasone (DECADRON) 4 MG tablet Take 10 pills a day x 4 days; start the day after chemo; EVERY 3 WEEKS. Patient not taking: Reported on 01/12/2018 11/11/17   Cammie Sickle, MD  montelukast (SINGULAIR) 10 MG tablet Take 1 tablet (10 mg total) by mouth at bedtime. Start 2 days prior to infusion. Take it for 4 days. Patient not taking: Reported on 01/12/2018 01/08/18   Cammie Sickle, MD  sucralfate (CARAFATE) 1 g tablet Take 1/2 tablet 2 times daily. Patient not  taking: Reported on 01/12/2018 10/23/17   Noreene Filbert, MD      PHYSICAL EXAMINATION:   VITAL SIGNS: Blood pressure 130/62, pulse 88, temperature 99.6 F (37.6 C), temperature source Oral, resp. rate (!) 28, weight 68.9 kg (152 lb), SpO2 98 %.  GENERAL:  72 y.o.-year-old patient lying in the bed with no acute distress.  She looks chronically ill, but nontoxic. EYES: Pupils equal, round, reactive to light and accommodation. No scleral icterus. HEENT: Head atraumatic, normocephalic. Oropharynx and nasopharynx clear.  NECK:  Supple, no jugular venous distention. No thyroid enlargement, no tenderness.  LUNGS: Reduced breath sounds bilaterally, no wheezing, rales,rhonchi or crepitation. No use of accessory muscles of respiration.  CARDIOVASCULAR: S1, S2 normal. No murmurs, rubs, or gallops.  ABDOMEN: Soft, nontender, nondistended. Bowel sounds present. No organomegaly or mass.  EXTREMITIES: No pedal edema, cyanosis, or clubbing.  NEUROLOGIC: No focal weakness. PSYCHIATRIC: The patient is alert and oriented x 3.  SKIN: No obvious rash, lesion, or ulcer.   LABORATORY PANEL:  CBC Recent Labs  Lab 01/08/18 0848 01/12/18 1952  WBC 8.6 16.4*  HGB 11.5* 10.8*  HCT 32.5* 31.8*  PLT 214 68*  MCV 90.7 93.1  MCH 32.1 31.6  MCHC 35.3 33.9  RDW 20.2* 21.4*  LYMPHSABS 0.4* PENDING  MONOABS 0.8 PENDING  EOSABS 0.2 PENDING  BASOSABS 0.0 PENDING   ------------------------------------------------------------------------------------------------------------------  Chemistries  Recent Labs  Lab 01/08/18 0848 01/12/18 1952  NA 138 133*  K 3.3* 3.7  CL 105 99*  CO2 24 25  GLUCOSE 144* 164*  BUN 13 9  CREATININE 0.41* 0.48  CALCIUM 8.8* 8.8*  AST 20 29  ALT 16 34  ALKPHOS 71 136*  BILITOT 0.6 0.8   ------------------------------------------------------------------------------------------------------------------ estimated creatinine clearance is 63.6 mL/min (by C-G formula based on  SCr of 0.48 mg/dL). ------------------------------------------------------------------------------------------------------------------ No results for input(s): TSH, T4TOTAL, T3FREE, THYROIDAB in the last 72 hours.  Invalid input(s): FREET3   Coagulation profile No results for input(s): INR, PROTIME in the last 168 hours. ------------------------------------------------------------------------------------------------------------------- No results for input(s): DDIMER in the last 72 hours. -------------------------------------------------------------------------------------------------------------------  Cardiac Enzymes Recent Labs  Lab 01/12/18 1952  TROPONINI <0.03   ------------------------------------------------------------------------------------------------------------------ Invalid input(s): POCBNP  ---------------------------------------------------------------------------------------------------------------  Urinalysis    Component Value Date/Time   COLORURINE AMBER (A) 01/12/2018 2031   APPEARANCEUR CLOUDY (A) 01/12/2018 2031   LABSPEC 1.024 01/12/2018 2031   PHURINE 6.0 01/12/2018 2031   GLUCOSEU NEGATIVE 01/12/2018 2031   HGBUR NEGATIVE 01/12/2018 2031   BILIRUBINUR NEGATIVE 01/12/2018 2031   KETONESUR 5 (A) 01/12/2018 2031   PROTEINUR 100 (A) 01/12/2018 2031   NITRITE POSITIVE (A) 01/12/2018 2031   LEUKOCYTESUR MODERATE (A) 01/12/2018 2031     RADIOLOGY: Dg Chest 2 View  Result Date: 01/12/2018 CLINICAL DATA:  Cough and congestion with fever. Lymphoma patient on chemotherapy. EXAM: CHEST - 2 VIEW COMPARISON:  PET of 12/30/2017. Most recent chest radiograph 12/18/2016 FINDINGS: Right Port-A-Cath unchanged in position with tip at low SVC. Midline trachea. Normal heart size. No pleural effusion or pneumothorax. Basilar predominant fibrosis is evidenced by subpleural interstitial prominence and volume loss at both lung bases. This is similar to the 12/18/2016. No  superimposed consolidation. IMPRESSION: No acute cardiopulmonary disease. Bibasilar pulmonary fibrosis, as before. Electronically Signed   By: Abigail Miyamoto M.D.   On: 01/12/2018 20:24    EKG: Orders placed or performed during the hospital encounter of 01/12/18  . ED EKG 12-Lead  . ED EKG 12-Lead    IMPRESSION AND PLAN:  1.  Sepsis, likely secondary to acute UTI.  We will start broad-spectrum IV antibiotics and IV fluid resuscitation.  Continue to monitor clinically closely and follow lactic acid level. 2.  Acute UTI, urine culture results pending.  See management as above under #1. 3.  Recurrent diffuse large B-cell lymphoma, currently undergoing chemotherapy.  Continue management per oncology.  4.  Diabetes type 2.  Will start low-carb diet and monitor blood sugars before meals at bedtime.  Will use insulin treatment during the hospital stay.  All the records are reviewed and case discussed with ED provider. Management plans discussed with the patient and she is in agreement.  CODE STATUS: Code Status History    Date Active Date Inactive Code Status Order ID Comments User Context   10/08/2016 1358 10/12/2016 2054 Full Code [id number]  Cammie Sickle, MD Inpatient   09/17/2016 1442 09/22/2016 1419 Full Code [id number]  Cammie Sickle, MD Inpatient       TOTAL TIME TAKING CARE OF THIS PATIENT: 64  minutes.    Amelia Jo M.D on 01/12/2018 at 9:33 PM  Between 7am to 6pm - Pager - [phone]  After 6pm go to [email or web address] - password [email or web address] [facility]  Office  [phone]  CC: Primary care physician; Rusty Aus, MD

## 2018-01-12 NOTE — ED Triage Notes (Signed)
Pt to ED reporting cough and congestion since last week that has developed into nausea yesterday and a fever of 101.2 today. Pt reports having taken 325mg  of tylenol and is afebrile at this time  Pt is a chemo pt, last treatment was on Wednesday. No vomiting or diarrhea reported and no SOB or increased WOB noted. Pt able to ambulate to treatment room without difficulty.

## 2018-01-13 LAB — BASIC METABOLIC PANEL
Anion gap: 5 (ref 5–15)
BUN: 6 mg/dL (ref 6–20)
CALCIUM: 8.1 mg/dL — AB (ref 8.9–10.3)
CO2: 28 mmol/L (ref 22–32)
CREATININE: 0.36 mg/dL — AB (ref 0.44–1.00)
Chloride: 102 mmol/L (ref 101–111)
GFR calc non Af Amer: >60 mL/min (ref >60)
GLUCOSE: 159 mg/dL — AB (ref 65–99)
Potassium: 3.3 mmol/L — ABNORMAL LOW (ref 3.5–5.1)
Sodium: 135 mmol/L (ref 135–145)

## 2018-01-13 LAB — CBC
HCT: 25.9 % — ABNORMAL LOW (ref 35.0–47.0)
HEMOGLOBIN: 9.1 g/dL — AB (ref 12.0–16.0)
MCH: 32.1 pg (ref 26.0–34.0)
MCHC: 35.1 g/dL (ref 32.0–36.0)
MCV: 91.7 fL (ref 80.0–100.0)
Platelets: 52 10*3/uL — ABNORMAL LOW (ref 150–440)
RBC: 2.82 MIL/uL — ABNORMAL LOW (ref 3.80–5.20)
RDW: 21.3 % — ABNORMAL HIGH (ref 11.5–14.5)
WBC: 10.8 10*3/uL (ref 3.6–11.0)

## 2018-01-13 LAB — GLUCOSE, CAPILLARY
GLUCOSE-CAPILLARY: 99 mg/dL (ref 65–99)
Glucose-Capillary: 126 mg/dL — ABNORMAL HIGH (ref 65–99)
Glucose-Capillary: 101 mg/dL — ABNORMAL HIGH (ref 65–99)
Glucose-Capillary: 95 mg/dL (ref 65–99)

## 2018-01-13 LAB — LACTIC ACID, PLASMA: LACTIC ACID, VENOUS: 1 mmol/L (ref 0.5–1.9)

## 2018-01-13 MED ORDER — PREMIER PROTEIN SHAKE
11.0000 [oz_av] | Freq: Two times a day (BID) | ORAL | Status: DC
  Administered 2018-01-13 – 2018-01-14 (×2): 11 [oz_av] via ORAL

## 2018-01-13 MED ORDER — B COMPLEX-C PO TABS
1.0000 | ORAL_TABLET | Freq: Every day | ORAL | Status: DC
  Administered 2018-01-13 – 2018-01-14 (×2): 1 via ORAL
  Filled 2018-01-13 (×2): qty 1

## 2018-01-13 NOTE — Care Management Note (Signed)
Case Management Note  Patient Details  Name: SHAWNEEQUA BALDRIDGE MRN: [id number] Date of Birth: 04/16/1946  Subjective/Objective:   Admitted to [facility] with the diagnosis of sepsis. Lives with husband, Edd Arbour [phone]). Sees Dr. Emily Filbert every 6 months. Prescriptions are filled at [facility] on [address]. No home Health. No skilled Nursing. No medical equipment in the home.takes care of all basic activities of daily living herself. Chemotherapy started 08/2016. Started again 4 weeks ago. Port placed. 2017. Large B Cell Lymphoma.  Appetite comes and goes. Family will transport                 Action/Plan: Will continue to follow for discharge plans   Expected Discharge Date:                  Expected Discharge Plan:     In-House Referral:     Discharge planning Services     Post Acute Care Choice:    Choice offered to:     DME Arranged:    DME Agency:     HH Arranged:    [place] Agency:     Status of Service:     If discussed at [employer] [facility] [employer], dates discussed:    Additional Comments:  Shelbie Ammons, RN MSN CCM Care Management [phone] 01/13/2018, 10:15 AM

## 2018-01-13 NOTE — Plan of Care (Signed)
  Problem: Education: Goal: Knowledge of General Education information will improve Outcome: Progressing   Problem: Health Behavior/Discharge Planning: Goal: Ability to manage health-related needs will improve Outcome: Progressing   Problem: Clinical Measurements: Goal: Ability to maintain clinical measurements within normal limits will improve Outcome: Progressing Goal: Will remain free from infection Outcome: Progressing Goal: Diagnostic test results will improve Outcome: Progressing Goal: Respiratory complications will improve Outcome: Progressing Goal: Cardiovascular complication will be avoided Outcome: Progressing   Problem: Activity: Goal: Risk for activity intolerance will decrease Outcome: Progressing   Problem: Nutrition: Goal: Adequate nutrition will be maintained Outcome: Progressing   Problem: Coping: Goal: Level of anxiety will decrease Outcome: Progressing   Problem: Elimination: Goal: Will not experience complications related to bowel motility Outcome: Progressing Goal: Will not experience complications related to urinary retention Outcome: Progressing   Problem: Pain Managment: Goal: General experience of comfort will improve Outcome: Progressing   Problem: Safety: Goal: Ability to remain free from injury will improve Outcome: Progressing   Problem: Skin Integrity: Goal: Risk for impaired skin integrity will decrease Outcome: Progressing   Problem: Spiritual Needs Goal: Ability to function at adequate level Outcome: Progressing   Problem: Education: Goal: Knowledge of the prescribed therapeutic regimen will improve Outcome: Progressing   Problem: Activity: Goal: Ability to perform activities at highest level will improve Outcome: Progressing   Problem: Bowel/Gastric: Goal: Occurrences of nausea will decrease Outcome: Progressing Goal: Bowel function will improve Outcome: Progressing   Problem: Nutritional: Goal: Maintenance of  adequate nutrition will improve Outcome: Progressing   Problem: Clinical Measurements: Goal: Will remain free from infection Outcome: Progressing   Problem: Skin Integrity: Goal: Status of oral mucous membranes will improve Outcome: Progressing

## 2018-01-13 NOTE — Progress Notes (Signed)
Dr Leslye Peer notified that patient had 4 beat run ov PSVT. MD acknowledged, no new orders given.

## 2018-01-13 NOTE — Progress Notes (Signed)
Initial Nutrition Assessment  DOCUMENTATION CODES:   Not applicable  INTERVENTION:  Recommend liberalizing diet to regular in setting of increased calorie/protein needs and poor appetite/intake.   Provide Premier protein po BID, each supplement provides 160 kcal and 30 grams of protein.  Encouraged adequate intake of calories and protein from meals, snacks, beverages, and oral nutrition supplements.  NUTRITION DIAGNOSIS:   Increased nutrient needs related to catabolic illness, cancer and cancer related treatments as evidenced by estimated needs.  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   Met with patient at bedside. She is known to this RD from an admission over one year ago. She had also been following with outpatient RD at cancer center and her last visit there was 03/18/2017. Patient reports her appetite has been decreased for 3 days now. Her last chemotherapy treatment was on 5/22. She is experiencing loss of taste and some nausea. Yesterday she was only able to have one small meal. Today she has only had a few bites. She typically tries to eat 2-3 meals per day. She drinks on Premier Protein daily and then one homemade protein shake. She is amenable to drinking Premier Protein po BID here.  UBW was 157 lbs. She was 157.4 lbs on 11/20/2017 and has lost 6.6 lbs (4.2% body weight) over almost 2 months, which is not significant for time frame. She reports that last year during chemotherapy she lost down to 118 lbs and she does not  want to lose that much weight again.  Meal Completion: bites to 0%  Medications reviewed and include: B-complex with vitamin C 1 tablet daily, Colace, gabapentin, Novolog 0-9 units TID, Novolog 0-5 units QHS, magnesium oxide 400 mg daily, Lopressor, Carafate 1 gram BID, Zosyn.  Labs reviewed: CBG 101-148, Potassium 3.3, Creatinine 0.36.  Patient is at risk for development of malnutrition.  NUTRITION - FOCUSED PHYSICAL EXAM:    Most Recent Value  Orbital Region  No depletion  Upper Arm Region  Mild depletion  Thoracic and Lumbar Region  No depletion  Buccal Region  No depletion  Temple Region  Mild depletion  Clavicle Bone Region  Mild depletion  Clavicle and Acromion Bone Region  Mild depletion  Scapular Bone Region  No depletion  Dorsal Hand  Mild depletion  Patellar Region  Mild depletion  Anterior Thigh Region  Mild depletion  Posterior Calf Region  Moderate depletion  Edema (RD Assessment)  None  Hair  Reviewed  Eyes  Reviewed  Mouth  Reviewed  Skin  Reviewed  Nails  Reviewed     Diet Order:   Diet Order           Diet heart healthy/carb modified Room service appropriate? Yes; Fluid consistency: Thin  Diet effective now          EDUCATION NEEDS:   Education needs have been addressed  Skin:  Skin Assessment: Reviewed RN Assessment  Last BM:  PTA (01/11/2018 per chart)  Height:   Ht Readings from Last 1 Encounters:  01/12/18 5'  8" (1.727 m)    Weight:   Wt Readings from Last 1 Encounters:  01/12/18 150 lb 12.8 oz (68.4 kg)    Ideal Body Weight:  63.6 kg  BMI:  Body mass index is 22.93 kg/m.  Estimated Nutritional Needs:   Kcal:  6160-7371 (MSJ x 1.3-1.5)  Protein:  80-100 grams (1.2-1.5 grams/kg)  Fluid:  1.6-1.9 L/day (1 mL/kcal)  Willey Blade, MS, RD, LDN Office: [phone] Pager: [phone] After Hours/Weekend Pager: [phone]

## 2018-01-13 NOTE — Progress Notes (Signed)
Patient ID: Rose Hill, female   DOB: [birth year], 72 y.o.   MRN: [id number]  [facility] PROGRESS NOTE  SHAJUAN MUSSO OIN:[id number] DOB: 09/15/1945 DOA: 01/12/2018 PCP: Rusty Aus, MD  HPI/Subjective: Patient feeling better than yesterday.  Came in with fever, nausea, cough.  With the cough she is bringing up clear phlegm.  No burning on urination or abdominal pain.  Urinating more frequently.  Objective: Vitals:   01/12/18 2253 01/13/18 0444  BP: (!) 125/49 (!) 126/52  Pulse: 94 95  Resp: (!) 24 19  Temp: 98.9 F (37.2 C) 98 F (36.7 C)  SpO2: 97% 95%    Filed Weights   01/12/18 1941 01/12/18 2253  Weight: 68.9 kg (152 lb) 68.4 kg (150 lb 12.8 oz)    ROS: Review of Systems  Constitutional: Negative for chills and fever.  Eyes: Negative for blurred vision.  Respiratory: Positive for cough and shortness of breath.   Cardiovascular: Negative for chest pain.  Gastrointestinal: Negative for abdominal pain, constipation, diarrhea, nausea and vomiting.  Genitourinary: Negative for dysuria.  Musculoskeletal: Negative for joint pain.  Neurological: Negative for dizziness and headaches.   Exam: Physical Exam  Constitutional: She is oriented to person, place, and time.  HENT:  Nose: No mucosal edema.  Mouth/Throat: No oropharyngeal exudate or posterior oropharyngeal edema.  Eyes: Pupils are equal, round, and reactive to light. Conjunctivae, EOM and lids are normal.  Neck: No JVD present. Carotid bruit is not present. No edema present. No thyroid mass and no thyromegaly present.  Cardiovascular: S1 normal and S2 normal. Exam reveals no gallop.  No murmur heard. Pulses:      Dorsalis pedis pulses are 2+ on the right side, and 2+ on the left side.  Respiratory: No respiratory distress. She has decreased breath sounds in the right lower field and the left lower field. She has no wheezes. She has no rhonchi. She has no rales.  GI: Soft. Bowel sounds are normal. There is no  tenderness.  Musculoskeletal:       Right ankle: She exhibits no swelling.       Left ankle: She exhibits no swelling.  Lymphadenopathy:    She has no cervical adenopathy.  Neurological: She is alert and oriented to person, place, and time. No cranial nerve deficit.  Skin: Skin is warm. No rash noted. Nails show no clubbing.  Psychiatric: She has a normal mood and affect.      Data Reviewed: Basic Metabolic Panel: Recent Labs  Lab 01/08/18 0848 01/12/18 1952 01/13/18 0330  NA 138 133* 135  K 3.3* 3.7 3.3*  CL 105 99* 102  CO2 24 25 28   GLUCOSE 144* 164* 159*  BUN 13 9 6   CREATININE 0.41* 0.48 0.36*  CALCIUM 8.8* 8.8* 8.1*   Liver Function Tests: Recent Labs  Lab 01/08/18 0848 01/12/18 1952  AST 20 29  ALT 16 34  ALKPHOS 71 136*  BILITOT 0.6 0.8  PROT 6.5 6.8  ALBUMIN 3.9 3.9   CBC: Recent Labs  Lab 01/08/18 0848 01/12/18 1952 01/13/18 0330  WBC 8.6 16.4* 10.8  NEUTROABS 7.2* 14.9*  --   HGB 11.5* 10.8* 9.1*  HCT 32.5* 31.8* 25.9*  MCV 90.7 93.1 91.7  PLT 214 68* 52*   Cardiac Enzymes: Recent Labs  Lab 01/12/18 1952  TROPONINI <0.03    CBG: Recent Labs  Lab 01/12/18 2320 01/13/18 0829 01/13/18 1158  GLUCAP 148* 126* 101*    Recent Results (from the past 240  hour(s))  Blood Culture (routine x 2)     Status: None (Preliminary result)   Collection Time: 01/12/18  7:52 PM  Result Value Ref Range Status   Specimen Description BLOOD LEFT ANTECUBITAL  Final   Special Requests   Final    BOTTLES DRAWN AEROBIC AND ANAEROBIC Blood Culture results may not be optimal due to an excessive volume of blood received in culture bottles   Culture   Final    NO GROWTH < 12 HOURS Performed at [facility], [address], [place], [place] [address]    Report Status PENDING  Incomplete  Blood Culture (routine x 2)     Status: None (Preliminary result)   Collection Time: 01/12/18  7:52 PM  Result Value Ref Range Status   Specimen Description  BLOOD PORT  Final   Special Requests   Final    BOTTLES DRAWN AEROBIC AND ANAEROBIC Blood Culture adequate volume   Culture   Final    NO GROWTH < 12 HOURS Performed at [facility], [address], [place], [place] [address]    Report Status PENDING  Incomplete     Studies: Dg Chest 2 View  Result Date: 01/12/2018 CLINICAL DATA:  Cough and congestion with fever. Lymphoma patient on chemotherapy. EXAM: CHEST - 2 VIEW COMPARISON:  PET of 12/30/2017. Most recent chest radiograph 12/18/2016 FINDINGS: Right Port-A-Cath unchanged in position with tip at low SVC. Midline trachea. Normal heart size. No pleural effusion or pneumothorax. Basilar predominant fibrosis is evidenced by subpleural interstitial prominence and volume loss at both lung bases. This is similar to the 12/18/2016. No superimposed consolidation. IMPRESSION: No acute cardiopulmonary disease. Bibasilar pulmonary fibrosis, as before. Electronically Signed   By: Abigail Miyamoto M.D.   On: 01/12/2018 20:24    Scheduled Meds: . B-complex with vitamin C  1 tablet Oral Daily  . docusate sodium  100 mg Oral BID  . gabapentin  300 mg Oral BID  . heparin  5,000 Units Subcutaneous Q12H  . insulin aspart  0-5 Units Subcutaneous QHS  . insulin aspart  0-9 Units Subcutaneous TID WC  . latanoprost  1 drop Both Eyes QHS  . magnesium oxide  400 mg Oral Daily  . metoprolol tartrate  25 mg Oral BID  . montelukast  10 mg Oral QHS  . pravastatin  40 mg Oral Daily  . sucralfate  1 g Oral BID   Continuous Infusions: . piperacillin-tazobactam (ZOSYN)  IV Stopped (01/13/18 9833)    Assessment/Plan:  1. Clinical sepsis.  Source likely acute cystitis without hematuria.  Patient was given vancomycin and Zosyn.  So far blood cultures are negative so I will get rid of the vancomycin.  Patient had recent chemotherapy and is immunocompromise.  Would like to watch 1 more day and make sure that she is afebrile prior to disposition.  Urine culture  still pending and urine analysis is positive. 2. Recurrent diffuse large B-cell lymphoma.  Follow-up with oncology as outpatient 3. Type 2 diabetes mellitus on sliding scale insulin while here 4. Diabetic neuropathy on gabapentin 5. Glaucoma on latanoprost 6. Hyperlipidemia unspecified on pravastatin 7. GERD on Carafate 8. Essential hypertension on metoprolol  Code Status:     Code Status Orders  (From admission, onward)        Start     Ordered   01/12/18 2254  Full code  Continuous     01/12/18 2253    Code Status History    Date Active  Date Inactive Code Status Order ID Comments User Context   10/08/2016 1358 10/12/2016 2054 Full Code [id number]  Cammie Sickle, MD Inpatient   09/17/2016 1442 09/22/2016 1419 Full Code [id number]  Cammie Sickle, MD Inpatient    Advance Directive Documentation     Most Recent Value  Type of Advance Directive  Healthcare Power of Attorney  Pre-existing out of facility DNR order (yellow form or pink MOST form)  -  "MOST" Form in Place?  -     Family Communication: Husband at the bedside Disposition Plan: Potential discharge tomorrow morning if afebrile and cultures remain negative  Antibiotics:  Zosyn  Time spent: 28 minutes  [place]

## 2018-01-13 NOTE — Progress Notes (Signed)
Rose Hill acknowledged a difficult night with little sleep. "Some times I know in my head God's got a plan but sometimes in my heart I struggle." Chaplain offered listening presence, acknowledged struggles of thinking about dying. Rose Hill is supported by her husband (over 49 years) and two sons. Husband acknowledges she is steady presence in family. Chaplain invited Rose Hill and Rose Hill to continue to engage making memories.

## 2018-01-14 LAB — GLUCOSE, CAPILLARY: GLUCOSE-CAPILLARY: 111 mg/dL — AB (ref 65–99)

## 2018-01-14 LAB — HIV ANTIBODY (ROUTINE TESTING W REFLEX): HIV Screen 4th Generation wRfx: NONREACTIVE

## 2018-01-14 MED ORDER — HEPARIN SOD (PORK) LOCK FLUSH 100 UNIT/ML IV SOLN
500.0000 [IU] | Freq: Once | INTRAVENOUS | Status: AC
  Administered 2018-01-14: 10:00:00 500 [IU] via INTRAVENOUS
  Filled 2018-01-14: qty 5

## 2018-01-14 MED ORDER — AMOXICILLIN-POT CLAVULANATE 875-125 MG PO TABS: 1.0000 | ORAL_TABLET | Freq: Two times a day (BID) | ORAL | Status: DC

## 2018-01-14 MED ORDER — PREMIER PROTEIN SHAKE: 11.0000 [oz_av] | Freq: Two times a day (BID) | ORAL | 0 refills | Status: DC

## 2018-01-14 MED ORDER — AMOXICILLIN-POT CLAVULANATE 875-125 MG PO TABS: 1.0000 | ORAL_TABLET | Freq: Two times a day (BID) | ORAL | 0 refills | Status: DC

## 2018-01-14 NOTE — Discharge Summary (Signed)
[facility] NAME: Rose Hill    MR#:  [id number]  DATE OF BIRTH:  [birth year]  DATE OF ADMISSION:  01/12/2018 ADMITTING PHYSICIAN: Amelia Jo, MD  DATE OF DISCHARGE: 01/14/2018 11:10 AM  PRIMARY CARE PHYSICIAN: Rusty Aus, MD    ADMISSION DIAGNOSIS:  Lower urinary tract infectious disease [N39.0] Sepsis, due to unspecified organism ([place]) [A41.9]  DISCHARGE DIAGNOSIS:  Active Problems:   Sepsis ([place])   SECONDARY DIAGNOSIS:   Past Medical History:  Diagnosis Date  . Diabetes mellitus without complication ([place])   . Heart murmur   . History of chemotherapy 07/08/2017  . History of gastric ulcer   . History of radiation therapy 07/08/2017  . Hypercholesterolemia   . Hypertension   . ILD (interstitial lung disease) ([place])    8 yrs ago  . Lymphadenopathy     HOSPITAL COURSE:   1.  Clinical sepsis.  Sources acute cystitis without hematuria.  She was given aggressive antibiotics with vancomycin and Zosyn.  Vancomycin was discontinued since blood cultures were initially negative.  Patient had recent chemotherapy and is immunocompromised.  Patient afebrile upon disposition.  Urine culture growing E. coli and sensitivities are still pending.  The patient does have numerous drug allergies.  The patient has been on Augmentin in the past so I sent her home on a course of Augmentin. 2.  Recurrent diffuse large cell B cell lymphoma.  Follow-up with oncology as outpatient 3.  Type 2 diabetes mellitus.  Patient can go back on metformin as outpatient 4.  Diabetic neuropathy on gabapentin 5.  Glaucoma on latanoprost 6.  Hyperlipidemia unspecified on pravastatin 7.  Essential hypertension on metoprolol  DISCHARGE CONDITIONS:   Satisfactory  CONSULTS OBTAINED:  None  DRUG ALLERGIES:   Allergies  Allergen Reactions  . Levaquin [Levofloxacin In D5w] Palpitations and Other (See Comments)    Burning in lower legs  . Cefuroxime  Axetil Diarrhea    Other reaction(s): Abdominal Pain  . Doxycycline     Other reaction(s): Abdominal Pain severe    DISCHARGE MEDICATIONS:   Allergies as of 01/14/2018      Reactions   Levaquin [levofloxacin In D5w] Palpitations, Other (See Comments)   Burning in lower legs   Cefuroxime Axetil Diarrhea   Other reaction(s): Abdominal Pain   Doxycycline    Other reaction(s): Abdominal Pain severe      Medication List    STOP taking these medications   dexamethasone 4 MG tablet Commonly known as:  DECADRON   sucralfate 1 g tablet Commonly known as:  CARAFATE     TAKE these medications   amoxicillin-clavulanate 875-125 MG tablet Commonly known as:  AUGMENTIN Take 1 tablet by mouth every 12 (twelve) hours.   b complex vitamins tablet Take 1 tablet by mouth daily.   gabapentin 300 MG capsule Commonly known as:  NEURONTIN Take 1 capsule (300 mg total) by mouth 2 (two) times daily.   latanoprost 0.005 % ophthalmic solution Commonly known as:  XALATAN Place 1 drop into both eyes at bedtime.   lidocaine-prilocaine cream Commonly known as:  EMLA Apply cream 1 hour before chemotherapy treatment, place small amount of saran wrap over cream to protect clothing.   Magnesium 400 MG Tabs Take 1 tablet by mouth daily.   metFORMIN 500 MG tablet Commonly known as:  GLUCOPHAGE Take 250 mg by mouth daily with breakfast.   metoprolol tartrate 25 MG tablet Commonly known as:  LOPRESSOR Take  1 tablet (25 mg total) by mouth 2 (two) times daily.   montelukast 10 MG tablet Commonly known as:  SINGULAIR Take 1 tablet (10 mg total) by mouth at bedtime. Start 2 days prior to infusion. Take it for 4 days.   ondansetron 8 MG tablet Commonly known as:  ZOFRAN One pill every 8 hours as needed for nausea/vomitting.   pravastatin 40 MG tablet Commonly known as:  PRAVACHOL Take 1 tablet by mouth daily.   prochlorperazine 10 MG tablet Commonly known as:  COMPAZINE Take 1 tablet (10  mg total) by mouth every 6 (six) hours as needed for nausea, vomiting or refractory nausea / vomiting.   protein supplement shake Liqd Commonly known as:  PREMIER PROTEIN Take 325 mLs (11 oz total) by mouth 2 (two) times daily between meals.        DISCHARGE INSTRUCTIONS:   6 days Follow-up PMD  If you experience worsening of your admission symptoms, develop shortness of breath, life threatening emergency, suicidal or homicidal thoughts you must seek medical attention immediately by calling 911 or calling your MD immediately  if symptoms less severe.  You Must read complete instructions/literature along with all the possible adverse reactions/side effects for all the Medicines you take and that have been prescribed to you. Take any new Medicines after you have completely understood and accept all the possible adverse reactions/side effects.   Please note  You were cared for by a hospitalist during your hospital stay. If you have any questions about your discharge medications or the care you received while you were in the hospital after you are discharged, you can call the unit and asked to speak with the hospitalist on call if the hospitalist that took care of you is not available. Once you are discharged, your primary care physician will handle any further medical issues. Please note that NO REFILLS for any discharge medications will be authorized once you are discharged, as it is imperative that you return to your primary care physician (or establish a relationship with a primary care physician if you do not have one) for your aftercare needs so that they can reassess your need for medications and monitor your lab values.    Today   CHIEF COMPLAINT:   Chief Complaint  Patient presents with  . Nausea    HISTORY OF PRESENT ILLNESS:  Rose Hill  is a 72 y.o. female came in with cough and fever   VITAL SIGNS:  Blood pressure (!) 129/54, pulse 98, temperature 98.2 F (36.8 C),  temperature source Oral, resp. rate 20, height 5\' 8"  (1.727 m), weight 70.5 kg (155 lb 6.4 oz), SpO2 98 %.    PHYSICAL EXAMINATION:  GENERAL:  72 y.o.-year-old patient lying in the bed with no acute distress.  EYES: Pupils equal, round, reactive to light and accommodation. No scleral icterus. Extraocular muscles intact.  HEENT: Head atraumatic, normocephalic. Oropharynx and nasopharynx clear.  NECK:  Supple, no jugular venous distention. No thyroid enlargement, no tenderness.  LUNGS: Normal breath sounds bilaterally, no wheezing, rales positive . rhonchi  at lung bases.  No use of accessory muscles of respiration.  CARDIOVASCULAR: S1, S2 normal. No murmurs, rubs, or gallops.  ABDOMEN: Soft, non-tender, non-distended. Bowel sounds present. No organomegaly or mass.  EXTREMITIES: No pedal edema, cyanosis, or clubbing.  NEUROLOGIC: Cranial nerves II through XII are intact. Muscle strength 5/5 in all extremities. Sensation intact. Gait not checked.  PSYCHIATRIC: The patient is alert and oriented x 3.  SKIN:  No obvious rash, lesion, or ulcer.   DATA REVIEW:   CBC Recent Labs  Lab 01/13/18 0330  WBC 10.8  HGB 9.1*  HCT 25.9*  PLT 52*    Chemistries  Recent Labs  Lab 01/12/18 1952 01/13/18 0330  NA 133* 135  K 3.7 3.3*  CL 99* 102  CO2 25 28  GLUCOSE 164* 159*  BUN 9 6  CREATININE 0.48 0.36*  CALCIUM 8.8* 8.1*  AST 29  --   ALT 34  --   ALKPHOS 136*  --   BILITOT 0.8  --     Cardiac Enzymes Recent Labs  Lab 01/12/18 1952  TROPONINI <0.03    Microbiology Results  Results for orders placed or performed during the hospital encounter of 01/12/18  Blood Culture (routine x 2)     Status: None (Preliminary result)   Collection Time: 01/12/18  7:52 PM  Result Value Ref Range Status   Specimen Description BLOOD LEFT ANTECUBITAL  Final   Special Requests   Final    BOTTLES DRAWN AEROBIC AND ANAEROBIC Blood Culture results may not be optimal due to an excessive volume of  blood received in culture bottles   Culture   Final    NO GROWTH 2 DAYS Performed at [facility], [place], [address], [place], [place] [address]    Report Status PENDING  Incomplete  Blood Culture (routine x 2)     Status: None (Preliminary result)   Collection Time: 01/12/18  7:52 PM  Result Value Ref Range Status   Specimen Description BLOOD PORT  Final   Special Requests   Final    BOTTLES DRAWN AEROBIC AND ANAEROBIC Blood Culture adequate volume   Culture   Final    NO GROWTH 2 DAYS Performed at [facility], [address], [place], [place] [address]    Report Status PENDING  Incomplete  Urine culture     Status: Abnormal (Preliminary result)   Collection Time: 01/12/18  8:31 PM  Result Value Ref Range Status   Specimen Description   Final    URINE, RANDOM Performed at [facility], [address], [place], [place] [address]    Special Requests   Final    NONE Performed at [facility], [address] [place], [place] [address]    Culture (A)  Final    >=100,000 COLONIES/mL ESCHERICHIA COLI SUSCEPTIBILITIES TO FOLLOW Performed at [facility], [place] [address], [place], [place] [address]    Report Status PENDING  Incomplete    RADIOLOGY:  Dg Chest 2 View  Result Date: 01/12/2018 CLINICAL DATA:  Cough and congestion with fever. Lymphoma patient on chemotherapy. EXAM: CHEST - 2 VIEW COMPARISON:  PET of 12/30/2017. Most recent chest radiograph 12/18/2016 FINDINGS: Right Port-A-Cath unchanged in position with tip at low SVC. Midline trachea. Normal heart size. No pleural effusion or pneumothorax. Basilar predominant fibrosis is evidenced by subpleural interstitial prominence and volume loss at both lung bases. This is similar to the 12/18/2016. No superimposed consolidation. IMPRESSION: No acute cardiopulmonary disease. Bibasilar pulmonary fibrosis, as before. Electronically Signed   By: Abigail Miyamoto M.D.   On: 01/12/2018 20:24    Management plans discussed with the patient, family and they are in agreement.  CODE STATUS:  Code Status History    Date Active Date Inactive Code Status Order ID Comments User Context   01/12/2018 2253 01/14/2018 1416 Full Code [id number]  Amelia Jo, MD Inpatient   10/08/2016 1358 10/12/2016  2054 Full Code [id number]  Cammie Sickle, MD Inpatient   09/17/2016 1442 09/22/2016 1419 Full Code [id number]  Cammie Sickle, MD Inpatient    Advance Directive Documentation     Most Recent Value  Type of Advance Directive  Healthcare Power of Attorney  Pre-existing out of facility DNR order (yellow form or pink MOST form)  -  "MOST" Form in Place?  -      TOTAL TIME TAKING CARE OF THIS PATIENT: 35 minutes.    Loletha Grayer M.D on 01/14/2018 at 2:43 PM  Between 7am to 6pm - Pager - [phone]  After 6pm go to [email or web address] - password [email or web address]  [employer] Office  [phone]  CC: Primary care physician; Rusty Aus, MD

## 2018-01-15 LAB — URINE CULTURE: Culture: >=100000 — AB

## 2018-01-16 ENCOUNTER — Telehealth: Payer: Self-pay

## 2018-01-16 NOTE — Telephone Encounter (Signed)
EMMI Follow-up: Received a call from Rose Hill as she had received a call from my number.  I explained our automated calls post discharge and asked if she had any concerns and she said everything was fine. Let her know there would be a 2nd automated call and to let us know if she had any concerns at that time.

## 2018-01-17 LAB — CULTURE, BLOOD (ROUTINE X 2)
CULTURE: NO GROWTH
Culture: NO GROWTH
SPECIAL REQUESTS: ADEQUATE

## 2018-01-22 ENCOUNTER — Other Ambulatory Visit: Payer: Self-pay

## 2018-01-22 ENCOUNTER — Inpatient Hospital Stay: Payer: Medicare Other | Admitting: Internal Medicine

## 2018-01-22 ENCOUNTER — Inpatient Hospital Stay: Payer: Medicare Other | Attending: Internal Medicine

## 2018-01-22 ENCOUNTER — Encounter: Payer: Self-pay | Admitting: Internal Medicine

## 2018-01-22 ENCOUNTER — Inpatient Hospital Stay: Payer: Medicare Other

## 2018-01-22 VITALS — BP 134/84 | HR 99 | Temp 97.5°F | Resp 12 | Ht 67.0 in | Wt 151.6 lb

## 2018-01-22 DIAGNOSIS — C8333 Diffuse large B-cell lymphoma, intra-abdominal lymph nodes: Secondary | ICD-10-CM | POA: Diagnosis not present

## 2018-01-22 DIAGNOSIS — J849 Interstitial pulmonary disease, unspecified: Secondary | ICD-10-CM | POA: Diagnosis not present

## 2018-01-22 DIAGNOSIS — R5381 Other malaise: Secondary | ICD-10-CM | POA: Insufficient documentation

## 2018-01-22 DIAGNOSIS — I10 Essential (primary) hypertension: Secondary | ICD-10-CM | POA: Diagnosis not present

## 2018-01-22 DIAGNOSIS — R531 Weakness: Secondary | ICD-10-CM

## 2018-01-22 DIAGNOSIS — M21372 Foot drop, left foot: Secondary | ICD-10-CM

## 2018-01-22 DIAGNOSIS — E7800 Pure hypercholesterolemia, unspecified: Secondary | ICD-10-CM | POA: Insufficient documentation

## 2018-01-22 DIAGNOSIS — Z87891 Personal history of nicotine dependence: Secondary | ICD-10-CM | POA: Insufficient documentation

## 2018-01-22 DIAGNOSIS — R05 Cough: Secondary | ICD-10-CM

## 2018-01-22 DIAGNOSIS — R5383 Other fatigue: Secondary | ICD-10-CM | POA: Insufficient documentation

## 2018-01-22 DIAGNOSIS — G629 Polyneuropathy, unspecified: Secondary | ICD-10-CM | POA: Insufficient documentation

## 2018-01-22 DIAGNOSIS — Z5111 Encounter for antineoplastic chemotherapy: Secondary | ICD-10-CM | POA: Diagnosis not present

## 2018-01-22 DIAGNOSIS — Z452 Encounter for adjustment and management of vascular access device: Secondary | ICD-10-CM | POA: Insufficient documentation

## 2018-01-22 DIAGNOSIS — Z7984 Long term (current) use of oral hypoglycemic drugs: Secondary | ICD-10-CM | POA: Insufficient documentation

## 2018-01-22 DIAGNOSIS — Z87440 Personal history of urinary (tract) infections: Secondary | ICD-10-CM

## 2018-01-22 DIAGNOSIS — Z79899 Other long term (current) drug therapy: Secondary | ICD-10-CM | POA: Insufficient documentation

## 2018-01-22 DIAGNOSIS — Z5112 Encounter for antineoplastic immunotherapy: Secondary | ICD-10-CM | POA: Insufficient documentation

## 2018-01-22 LAB — BASIC METABOLIC PANEL
Anion gap: 12 (ref 5–15)
BUN: 10 mg/dL (ref 6–20)
CHLORIDE: 107 mmol/L (ref 101–111)
CO2: 24 mmol/L (ref 22–32)
CREATININE: 0.48 mg/dL (ref 0.44–1.00)
Calcium: 8.7 mg/dL — ABNORMAL LOW (ref 8.9–10.3)
GFR calc Af Amer: >60 mL/min (ref >60)
GLUCOSE: 204 mg/dL — AB (ref 65–99)
Potassium: 3.6 mmol/L (ref 3.5–5.1)
SODIUM: 143 mmol/L (ref 135–145)

## 2018-01-22 LAB — CBC WITH DIFFERENTIAL/PLATELET
Basophils Absolute: 0.1 10*3/uL (ref 0–0.1)
Basophils Relative: 1 %
EOS ABS: 0.1 10*3/uL (ref 0–0.7)
EOS PCT: 2 %
HCT: 31.3 % — ABNORMAL LOW (ref 35.0–47.0)
Hemoglobin: 10.6 g/dL — ABNORMAL LOW (ref 12.0–16.0)
LYMPHS ABS: 0.7 10*3/uL — AB (ref 1.0–3.6)
LYMPHS PCT: 11 %
MCH: 32.7 pg (ref 26.0–34.0)
MCHC: 34 g/dL (ref 32.0–36.0)
MCV: 96.1 fL (ref 80.0–100.0)
MONO ABS: 1.2 10*3/uL — AB (ref 0.2–0.9)
Monocytes Relative: 21 %
Neutro Abs: 3.9 10*3/uL (ref 1.4–6.5)
Neutrophils Relative %: 65 %
Platelets: 184 10*3/uL (ref 150–440)
RBC: 3.26 MIL/uL — AB (ref 3.80–5.20)
RDW: 23.6 % — AB (ref 11.5–14.5)
WBC: 6 10*3/uL (ref 3.6–11.0)

## 2018-01-22 MED ORDER — SODIUM CHLORIDE 0.9 % IV SOLN
412.0000 mg | Freq: Once | INTRAVENOUS | Status: AC
  Administered 2018-01-22: 410 mg via INTRAVENOUS
  Filled 2018-01-22: qty 41

## 2018-01-22 MED ORDER — PALONOSETRON HCL INJECTION 0.25 MG/5ML
0.2500 mg | Freq: Once | INTRAVENOUS | Status: AC
  Administered 2018-01-22: 0.25 mg via INTRAVENOUS

## 2018-01-22 MED ORDER — SODIUM CHLORIDE 0.9 % IV SOLN
Freq: Once | INTRAVENOUS | Status: AC
  Administered 2018-01-22: 09:00:00 via INTRAVENOUS
  Filled 2018-01-22: qty 1000

## 2018-01-22 MED ORDER — SODIUM CHLORIDE 0.9% FLUSH
10.0000 mL | INTRAVENOUS | Status: DC | PRN
  Administered 2018-01-22: 10 mL via INTRAVENOUS
  Filled 2018-01-22: qty 10

## 2018-01-22 MED ORDER — MONTELUKAST SODIUM 10 MG PO TABS: ORAL_TABLET | ORAL | 3 refills | Status: DC

## 2018-01-22 MED ORDER — GEMCITABINE HCL CHEMO INJECTION 1 GM/26.3ML
1800.0000 mg | Freq: Once | INTRAVENOUS | Status: AC
  Administered 2018-01-22: 1800 mg via INTRAVENOUS
  Filled 2018-01-22: qty 26.3

## 2018-01-22 MED ORDER — SODIUM CHLORIDE 0.9 % IV SOLN
Freq: Once | INTRAVENOUS | Status: AC
  Administered 2018-01-22: 09:00:00 via INTRAVENOUS
  Filled 2018-01-22: qty 5

## 2018-01-22 MED ORDER — HEPARIN SOD (PORK) LOCK FLUSH 100 UNIT/ML IV SOLN
500.0000 [IU] | Freq: Once | INTRAVENOUS | Status: AC
  Administered 2018-01-22: 500 [IU] via INTRAVENOUS
  Filled 2018-01-22: qty 5

## 2018-01-22 NOTE — Progress Notes (Signed)
Patient here for pre treatment check. She was hospitalized last week for sepsis.

## 2018-01-22 NOTE — Assessment & Plan Note (Addendum)
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 Labs today reviewed;  acceptable for treatment today.  We will plan to get PET scan after cycle #4; for further consideration of stem cell transplant.  Discussed with Dr. Sherral Hammers; [facility] bone marrow transplant team.  If patient is not a candidate for stem cell transplant for any reason-I would recommend total of 6 cycles of R GDP chemotherapy.  #Recent UTI status post-hospitalizations/antibiotics currently resolved.   #Peripheral neuropathy/left foot drop-stable.  #Allergies/cough-recommend Singulair.  # follow up in 3 weeks/labs; NO treatment; PET 1-2 days prior.   # cc; Dr.Woods.

## 2018-01-22 NOTE — Progress Notes (Signed)
[facility] OFFICE PROGRESS NOTE  Patient Care Team: Rusty Aus, [name]  Cancer Staging No matching staging information was found for the patient.   Oncology History   DEC 2017- DLBCL "TRIPLE HIT [myc/ bcl-2/bcl-6 gene rearrangement FISH]" ~10 cm mass RP LN; STAGE II [jan 2018- BMBx-NEG]; Jan 8th R-[facility];   # JAN 29th 2018- DA-R EPOCH x5 ; PET CR; s/p RT [last 03/18/2017]  # FEB-MARCH 2019- RECURRENCE of DLBCL [s/p RP Bx; 4 cm mass inferior to Left Kidney] ; II [facility]; Dr.Grover.   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  female pleasant patient above history of relapsed diffuse large B-cell lymphoma currently on salvage chemotherapy R GDP is here for follow-up.   Patient was recently admitted the hospital for UTI found to have E. coli infection.  Treated with antibiotics.  Patient feels better today.  Fatigue.  Otherwise overall improved.   Review of Systems  Constitutional: Positive for malaise/fatigue. Negative for chills, diaphoresis, fever and weight loss.  HENT: Negative for nosebleeds and sore throat.   Eyes: Negative for double vision.  Respiratory: Positive for cough. Negative for hemoptysis, sputum production, shortness of breath and wheezing.   Cardiovascular: Negative for chest pain, palpitations, orthopnea and leg swelling.  Gastrointestinal: Negative for abdominal pain, blood in stool, constipation, diarrhea, heartburn, melena, nausea and vomiting.  Genitourinary:  Negative for dysuria, frequency and urgency.  Musculoskeletal: Negative for back pain and joint pain.  Skin: Negative.  Negative for itching and rash.  Neurological: Positive for focal weakness (Chronic mild weakness of the left foot). Negative for dizziness, tingling, weakness and headaches.  Endo/Heme/Allergies: Does not bruise/bleed easily.  Psychiatric/Behavioral: Negative for depression. The patient is not nervous/anxious and does not have insomnia.       PAST MEDICAL HISTORY :  Past Medical History:  Diagnosis Date  . Diabetes mellitus without complication ([name])   . Heart murmur   . History of chemotherapy 07/08/2017  . History of gastric ulcer   . History of radiation therapy 07/08/2017  . Hypercholesterolemia   . Hypertension   . ILD (interstitial lung disease) ([name])    8 yrs ago  . Lymphadenopathy     PAST SURGICAL HISTORY :   Past Surgical History:  Procedure Laterality Date  . BREAST BIOPSY Left 2010   neg  . CATARACT EXTRACTION W/PHACO Left 10/16/2017   Procedure: CATARACT EXTRACTION PHACO AND INTRAOCULAR LENS PLACEMENT ([name]) COMPLICATED DIABETIC LEFT;  Surgeon: Leandrew Koyanagi, MD;  Location: [name];  Service: Ophthalmology;  Laterality: Left;  IRIS HOOKS Diabetic - oral meds  . HERNIA REPAIR    . PARTIAL HYSTERECTOMY     age 14  . PERIPHERAL VASCULAR CATHETERIZATION N/A 08/22/2016   Procedure: [name];  Surgeon: [name], MD;  Location: [facility];  Service: Cardiovascular;  Laterality: N/A;  . TONSILLECTOMY      FAMILY HISTORY :   Family History  Problem Relation Age of Onset  . Heart disease Mother   . Heart disease Father   . Heart  disease Brother     SOCIAL HISTORY:   Social History   Tobacco Use  . Smoking status: Former Smoker    Last attempt to quit: 08/21/1958    Years since quitting: 59.4  . Smokeless tobacco: Never Used  Substance Use Topics  . Alcohol use: No  . Drug use: No     ALLERGIES:  is allergic to levaquin [levofloxacin in d5w]; cefuroxime axetil; and doxycycline.  MEDICATIONS:  Current Outpatient Medications  Medication Sig Dispense Refill  . b complex vitamins tablet Take 1 tablet by mouth daily.    Marland Kitchen dexamethasone (DECADRON) 4 MG tablet Take 1 tablet by mouth taper from 4 doses each day to 1 dose and stop.    Marland Kitchen gabapentin (NEURONTIN) 300 MG capsule Take 1 capsule (300 mg total) by mouth 2 (two) times daily. 180 capsule 4  . latanoprost (XALATAN) 0.005 % ophthalmic solution Place 1 drop into both eyes at bedtime.     . lidocaine-prilocaine (EMLA) cream Apply cream 1 hour before chemotherapy treatment, place small amount of saran wrap over cream to protect clothing. 30 g 1  . Magnesium 400 MG TABS Take 1 tablet by mouth daily.    . metFORMIN (GLUCOPHAGE) 500 MG tablet Take 250 mg by mouth daily with breakfast.     . metoprolol tartrate (LOPRESSOR) 25 MG tablet Take 1 tablet (25 mg total) by mouth 2 (two) times daily. 180 tablet 4  . montelukast (SINGULAIR) 10 MG tablet One a day. 30 tablet 3  . ondansetron (ZOFRAN) 8 MG tablet One pill every 8 hours as needed for nausea/vomitting. 40 tablet 1  . pravastatin (PRAVACHOL) 40 MG tablet Take 1 tablet by mouth daily.    . prochlorperazine (COMPAZINE) 10 MG tablet Take 1 tablet (10 mg total) by mouth every 6 (six) hours as needed for nausea, vomiting or refractory nausea / vomiting. 40 tablet 0  . protein supplement shake (PREMIER PROTEIN) LIQD Take 325 mLs (11 oz total) by mouth 2 (two) times daily between meals. 60 Can 0   No current facility-administered medications for this visit.    Facility-Administered Medications Ordered in Other Visits  Medication Dose Route Frequency Provider Last Rate Last Dose  . 0.9 %  sodium chloride infusion   Intravenous Continuous Cammie Sickle, MD 10 mL/hr at 12/05/16 1030    . 0.9 %  sodium chloride infusion   Intravenous Continuous Cammie Sickle, MD    Stopped at 12/26/16 1011  . 0.9 %  sodium chloride infusion   Intravenous Continuous Charlaine Dalton R, MD 10 mL/hr at 12/27/16 0930    . heparin lock flush 100 unit/mL  500 Units Intravenous Once Charlaine Dalton R, MD      . heparin lock flush 100 unit/mL  500 Units Intravenous Once Charlaine Dalton R, MD      . sodium chloride flush (NS) 0.9 % injection 10 mL  10 mL Intravenous PRN Cammie Sickle, MD   10 mL at 11/06/16 1000  . sodium chloride flush (NS) 0.9 % injection 10 mL  10 mL Intravenous PRN Cammie Sickle, MD   10 mL at 11/07/16 0905  . sodium chloride flush (NS) 0.9 % injection 10 mL  10 mL Intravenous PRN Charlaine Dalton R, MD      . sodium chloride flush (NS) 0.9 % injection 10 mL  10 mL Intravenous PRN Cammie Sickle, MD   10 mL at 12/05/16 1030  . sodium chloride flush (NS) 0.9 %  injection 10 mL  10 mL Intravenous PRN Cammie Sickle, MD   10 mL at 12/27/16 0930    PHYSICAL EXAMINATION: ECOG PERFORMANCE STATUS: 0 - Asymptomatic  BP 134/84 (BP Location: Left Arm, Patient Position: Sitting)   Pulse 99   Temp (!) 97.5 F (36.4 C) (Tympanic)   Resp 12   Ht 5\' 7"  (1.702 m) Comment: stated ht  Wt 151 lb 9.6 oz (68.8 kg)   BMI 23.74 kg/m   Filed Weights   01/22/18 0824  Weight: 151 lb 9.6 oz (68.8 kg)    GENERAL: Well-nourished well-developed; Alert, no distress and comfortable.  Accompanied by husband. EYES: no pallor or icterus OROPHARYNX: no thrush or ulceration; NECK: supple; no lymph nodes felt. LYMPH:  no palpable lymphadenopathy in the axillary or inguinal regions LUNGS: Decreased breath sounds auscultation bilaterally. No wheeze; positive for bilateral crackles. HEART/CVS: regular rate & rhythm and no murmurs; No lower extremity edema ABDOMEN:abdomen soft, non-tender and normal bowel sounds. No hepatomegaly or splenomegaly.  Musculoskeletal:no cyanosis of digits and no clubbing  PSYCH: alert & oriented x 3 with fluent  speech NEURO: no focal motor/sensory deficits-except for mild weakness in the left foot. SKIN:  no rashes or significant lesions    LABORATORY DATA:  I have reviewed the data as listed    Component Value Date/Time   NA 143 01/22/2018 0806   K 3.6 01/22/2018 0806   K 4.1 07/01/2014 1429   CL 107 01/22/2018 0806   CO2 24 01/22/2018 0806   GLUCOSE 204 (H) 01/22/2018 0806   BUN 10 01/22/2018 0806   CREATININE 0.48 01/22/2018 0806   CALCIUM 8.7 (L) 01/22/2018 0806   PROT 6.8 01/12/2018 1952   ALBUMIN 3.9 01/12/2018 1952   AST 29 01/12/2018 1952   ALT 34 01/12/2018 1952   ALKPHOS 136 (H) 01/12/2018 1952   BILITOT 0.8 01/12/2018 1952   GFRNONAA >60 01/22/2018 0806   GFRAA >60 01/22/2018 0806    No results found for: SPEP, UPEP  Lab Results  Component Value Date   WBC 6.0 01/22/2018   NEUTROABS 3.9 01/22/2018   HGB 10.6 (L) 01/22/2018   HCT 31.3 (L) 01/22/2018   MCV 96.1 01/22/2018   PLT 184 01/22/2018      Chemistry      Component Value Date/Time   NA 143 01/22/2018 0806   K 3.6 01/22/2018 0806   K 4.1 07/01/2014 1429   CL 107 01/22/2018 0806   CO2 24 01/22/2018 0806   BUN 10 01/22/2018 0806   CREATININE 0.48 01/22/2018 0806      Component Value Date/Time   CALCIUM 8.7 (L) 01/22/2018 0806   ALKPHOS 136 (H) 01/12/2018 1952   AST 29 01/12/2018 1952   ALT 34 01/12/2018 1952   BILITOT 0.8 01/12/2018 1952       RADIOGRAPHIC STUDIES: I have personally reviewed the radiological images as listed and agreed with the findings in the report. No results found.   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 Labs today reviewed;  acceptable for treatment today.  We will plan to get PET scan after cycle #4; for further consideration of stem cell  transplant.  Discussed with Dr. Sherral Hammers; [facility] bone marrow transplant team.  If patient is not a candidate for stem cell transplant for any  reason-I would recommend total of 6 cycles of R GDP chemotherapy.  #Recent UTI status post-hospitalizations/antibiotics currently resolved.   #Peripheral neuropathy/left foot drop-stable.  #Allergies/cough-recommend Singulair.  # follow up in 3 weeks/labs; NO treatment; PET 1-2 days prior.   # cc; Dr.Woods.    Orders Placed This Encounter  Procedures  . NM PET Image Restag (PS) Skull Base To Thigh    Standing Status:   Future    Standing Expiration Date:   01/22/2019    Order Specific Question:   If indicated for the ordered procedure, I authorize the administration of a radiopharmaceutical per Radiology protocol    Answer:   Yes    Order Specific Question:   Preferred imaging location?    Answer:   [facility]    Order Specific Question:   Radiology Contrast Protocol - do NOT remove file path    Answer:   [email or web address]  . CBC with Differential    Standing Status:   Future    Standing Expiration Date:   01/23/2019  . Comprehensive metabolic panel    Standing Status:   Future    Standing Expiration Date:   01/23/2019  . Lactate dehydrogenase    Standing Status:   Future    Standing Expiration Date:   01/23/2019   All questions were answered. The patient knows to call the clinic with any problems, questions or concerns.      Cammie Sickle, MD 01/22/2018 6:49 PM

## 2018-01-23 ENCOUNTER — Ambulatory Visit: Payer: Medicare Other

## 2018-01-29 ENCOUNTER — Inpatient Hospital Stay: Payer: Medicare Other

## 2018-01-29 ENCOUNTER — Other Ambulatory Visit: Payer: Self-pay | Admitting: Oncology

## 2018-01-29 VITALS — BP 120/72 | HR 90 | Temp 98.4°F | Resp 18 | Wt 147.6 lb

## 2018-01-29 DIAGNOSIS — C8333 Diffuse large B-cell lymphoma, intra-abdominal lymph nodes: Secondary | ICD-10-CM

## 2018-01-29 LAB — CBC WITH DIFFERENTIAL/PLATELET
Basophils Absolute: 0 10*3/uL (ref 0–0.1)
Basophils Relative: 1 %
EOS ABS: 0.1 10*3/uL (ref 0–0.7)
EOS PCT: 2 %
HCT: 32.9 % — ABNORMAL LOW (ref 35.0–47.0)
HEMOGLOBIN: 11.6 g/dL — AB (ref 12.0–16.0)
LYMPHS ABS: 0.5 10*3/uL — AB (ref 1.0–3.6)
Lymphocytes Relative: 7 %
MCH: 32.9 pg (ref 26.0–34.0)
MCHC: 35.1 g/dL (ref 32.0–36.0)
MCV: 93.6 fL (ref 80.0–100.0)
MONOS PCT: 14 %
Monocytes Absolute: 1 10*3/uL — ABNORMAL HIGH (ref 0.2–0.9)
NEUTROS PCT: 76 %
Neutro Abs: 5.9 10*3/uL (ref 1.4–6.5)
Platelets: 187 10*3/uL (ref 150–440)
RBC: 3.52 MIL/uL — ABNORMAL LOW (ref 3.80–5.20)
RDW: 21.6 % — ABNORMAL HIGH (ref 11.5–14.5)
WBC: 7.7 10*3/uL (ref 3.6–11.0)

## 2018-01-29 LAB — COMPREHENSIVE METABOLIC PANEL
ALBUMIN: 4.2 g/dL (ref 3.5–5.0)
ALT: 19 U/L (ref 14–54)
AST: 25 U/L (ref 15–41)
Alkaline Phosphatase: 75 U/L (ref 38–126)
Anion gap: 11 (ref 5–15)
BUN: 11 mg/dL (ref 6–20)
CHLORIDE: 101 mmol/L (ref 101–111)
CO2: 25 mmol/L (ref 22–32)
CREATININE: 0.65 mg/dL (ref 0.44–1.00)
Calcium: 9.1 mg/dL (ref 8.9–10.3)
GFR calc Af Amer: >60 mL/min (ref >60)
GFR calc non Af Amer: >60 mL/min (ref >60)
Glucose, Bld: 131 mg/dL — ABNORMAL HIGH (ref 65–99)
Potassium: 3.6 mmol/L (ref 3.5–5.1)
SODIUM: 137 mmol/L (ref 135–145)
Total Bilirubin: 0.6 mg/dL (ref 0.3–1.2)
Total Protein: 6.8 g/dL (ref 6.5–8.1)

## 2018-01-29 MED ORDER — SODIUM CHLORIDE 0.9 % IV SOLN
375.0000 mg/m2 | Freq: Once | INTRAVENOUS | Status: AC
  Administered 2018-01-29: 700 mg via INTRAVENOUS
  Filled 2018-01-29: qty 50

## 2018-01-29 MED ORDER — HEPARIN SOD (PORK) LOCK FLUSH 100 UNIT/ML IV SOLN
500.0000 [IU] | Freq: Once | INTRAVENOUS | Status: AC
  Administered 2018-01-29: 500 [IU] via INTRAVENOUS
  Filled 2018-01-29: qty 5

## 2018-01-29 MED ORDER — ACETAMINOPHEN 325 MG PO TABS
650.0000 mg | ORAL_TABLET | Freq: Once | ORAL | Status: AC
Start: 2018-01-29 — End: 2018-01-29
  Administered 2018-01-29: 650 mg via ORAL
  Filled 2018-01-29: qty 2

## 2018-01-29 MED ORDER — SODIUM CHLORIDE 0.9% FLUSH
10.0000 mL | Freq: Once | INTRAVENOUS | Status: AC
  Administered 2018-01-29: 10 mL via INTRAVENOUS
  Filled 2018-01-29: qty 10

## 2018-01-29 MED ORDER — SODIUM CHLORIDE 0.9 % IV SOLN
Freq: Once | INTRAVENOUS | Status: AC
  Administered 2018-01-29: 09:00:00 via INTRAVENOUS
  Filled 2018-01-29: qty 1000

## 2018-01-29 MED ORDER — PROCHLORPERAZINE MALEATE 10 MG PO TABS
10.0000 mg | ORAL_TABLET | Freq: Once | ORAL | Status: AC
  Administered 2018-01-29: 10 mg via ORAL
  Filled 2018-01-29: qty 1

## 2018-01-29 MED ORDER — DIPHENHYDRAMINE HCL 25 MG PO CAPS
50.0000 mg | ORAL_CAPSULE | Freq: Once | ORAL | Status: AC
  Administered 2018-01-29: 50 mg via ORAL
  Filled 2018-01-29: qty 2

## 2018-01-29 MED ORDER — SODIUM CHLORIDE 0.9 % IV SOLN
1800.0000 mg | Freq: Once | INTRAVENOUS | Status: AC
  Administered 2018-01-29: 1800 mg via INTRAVENOUS
  Filled 2018-01-29: qty 26.3

## 2018-01-29 MED ORDER — SODIUM CHLORIDE 0.9 % IV SOLN: 375.0000 mg/m2 | Freq: Once | INTRAVENOUS | Status: DC

## 2018-01-30 ENCOUNTER — Inpatient Hospital Stay: Payer: Medicare Other

## 2018-01-30 DIAGNOSIS — C8333 Diffuse large B-cell lymphoma, intra-abdominal lymph nodes: Secondary | ICD-10-CM | POA: Diagnosis not present

## 2018-01-30 MED ORDER — PEGFILGRASTIM-CBQV 6 MG/0.6ML ~~LOC~~ SOSY
6.0000 mg | PREFILLED_SYRINGE | Freq: Once | SUBCUTANEOUS | Status: AC
  Administered 2018-01-30: 6 mg via SUBCUTANEOUS
  Filled 2018-01-30: qty 0.6

## 2018-02-12 ENCOUNTER — Inpatient Hospital Stay: Payer: Medicare Other | Admitting: Internal Medicine

## 2018-02-12 ENCOUNTER — Inpatient Hospital Stay: Payer: Medicare Other

## 2018-02-12 VITALS — BP 123/79 | HR 82 | Temp 97.3°F | Resp 16 | Wt 150.8 lb

## 2018-02-12 DIAGNOSIS — R531 Weakness: Secondary | ICD-10-CM | POA: Diagnosis not present

## 2018-02-12 DIAGNOSIS — Z87440 Personal history of urinary (tract) infections: Secondary | ICD-10-CM | POA: Diagnosis not present

## 2018-02-12 DIAGNOSIS — Z79899 Other long term (current) drug therapy: Secondary | ICD-10-CM

## 2018-02-12 DIAGNOSIS — R05 Cough: Secondary | ICD-10-CM

## 2018-02-12 DIAGNOSIS — E7800 Pure hypercholesterolemia, unspecified: Secondary | ICD-10-CM | POA: Diagnosis not present

## 2018-02-12 DIAGNOSIS — R5381 Other malaise: Secondary | ICD-10-CM | POA: Diagnosis not present

## 2018-02-12 DIAGNOSIS — C8333 Diffuse large B-cell lymphoma, intra-abdominal lymph nodes: Secondary | ICD-10-CM

## 2018-02-12 DIAGNOSIS — Z87891 Personal history of nicotine dependence: Secondary | ICD-10-CM

## 2018-02-12 DIAGNOSIS — I10 Essential (primary) hypertension: Secondary | ICD-10-CM

## 2018-02-12 DIAGNOSIS — G629 Polyneuropathy, unspecified: Secondary | ICD-10-CM

## 2018-02-12 DIAGNOSIS — R5383 Other fatigue: Secondary | ICD-10-CM | POA: Diagnosis not present

## 2018-02-12 DIAGNOSIS — M21372 Foot drop, left foot: Secondary | ICD-10-CM

## 2018-02-12 DIAGNOSIS — Z7984 Long term (current) use of oral hypoglycemic drugs: Secondary | ICD-10-CM

## 2018-02-12 LAB — CBC WITH DIFFERENTIAL/PLATELET
BASOS ABS: 0.1 10*3/uL (ref 0–0.1)
Basophils Relative: 1 %
Eosinophils Absolute: 0.1 10*3/uL (ref 0–0.7)
Eosinophils Relative: 1 %
HCT: 31.9 % — ABNORMAL LOW (ref 35.0–47.0)
HEMOGLOBIN: 10.8 g/dL — AB (ref 12.0–16.0)
LYMPHS ABS: 0.6 10*3/uL — AB (ref 1.0–3.6)
LYMPHS PCT: 10 %
MCH: 33.7 pg (ref 26.0–34.0)
MCHC: 33.8 g/dL (ref 32.0–36.0)
MCV: 99.5 fL (ref 80.0–100.0)
Monocytes Absolute: 1.2 10*3/uL — ABNORMAL HIGH (ref 0.2–0.9)
Monocytes Relative: 18 %
NEUTROS ABS: 4.5 10*3/uL (ref 1.4–6.5)
NEUTROS PCT: 70 %
Platelets: 139 10*3/uL — ABNORMAL LOW (ref 150–440)
RBC: 3.21 MIL/uL — AB (ref 3.80–5.20)
RDW: 21.7 % — ABNORMAL HIGH (ref 11.5–14.5)
WBC: 6.4 10*3/uL (ref 3.6–11.0)

## 2018-02-12 LAB — COMPREHENSIVE METABOLIC PANEL
ALT: 17 U/L (ref 0–44)
ANION GAP: 8 (ref 5–15)
AST: 23 U/L (ref 15–41)
Albumin: 3.9 g/dL (ref 3.5–5.0)
Alkaline Phosphatase: 69 U/L (ref 38–126)
BILIRUBIN TOTAL: 0.3 mg/dL (ref 0.3–1.2)
BUN: 7 mg/dL — ABNORMAL LOW (ref 8–23)
CHLORIDE: 107 mmol/L (ref 98–111)
CO2: 26 mmol/L (ref 22–32)
Calcium: 9.2 mg/dL (ref 8.9–10.3)
Creatinine, Ser: 0.42 mg/dL — ABNORMAL LOW (ref 0.44–1.00)
GFR calc non Af Amer: >60 mL/min (ref >60)
Glucose, Bld: 126 mg/dL — ABNORMAL HIGH (ref 70–99)
POTASSIUM: 3.9 mmol/L (ref 3.5–5.1)
Sodium: 141 mmol/L (ref 135–145)
Total Protein: 6.4 g/dL — ABNORMAL LOW (ref 6.5–8.1)

## 2018-02-12 LAB — LACTATE DEHYDROGENASE: LDH: 190 U/L (ref 98–192)

## 2018-02-12 NOTE — Assessment & Plan Note (Addendum)
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  #Patient currently status post 4 cycles; clinical no evidence of progression.  However PET scan denied by insurance/awaiting approval.  #Discussed with the patient regarding further options regarding autologous stem cell transplant; patient is not sure if she wants to go to the procedure given her comorbidities etc. patient is considering evaluation at [facility].  #I discussed with the patient that if she is not interested in curative option of the transplant; then her go to palliative-and I would recommend 2 more cycles of current therapy with R-GDP.  Patient is willing to go to chemotherapy at this time   #Peripheral neuropathy/left foot drop-stable.  #Diarrhea grade 1 intermittent unclear etiology if worsen to let us know; recommend checking GI PCR/see if testing  # follow up in 2 weeks/labs-gem-carbo; 3 weeks-R-gem-cbc.   Addendum: PET scan approved by insurance-PET scan shows continued partial response of the left infrarenal lesion-continues to be hypommetabolic.   # cc; Dr.Woods.

## 2018-02-12 NOTE — Progress Notes (Signed)
[place] OFFICE PROGRESS NOTE  Patient Care Team: Rusty Aus, [name]  Cancer Staging No matching staging information was found for the patient.   Oncology History   DEC 2017- DLBCL "TRIPLE HIT [myc/ bcl-2/bcl-6 gene rearrangement FISH]" ~10 cm mass RP LN; STAGE II [jan 2018- BMBx-NEG]; Jan 8th R-CHOP;   # JAN 29th 2018- DA-R EPOCH x5 ; PET CR; s/p RT [last 03/18/2017]  # FEB-MARCH 2019- RECURRENCE of DLBCL [s/p RP Bx; 4 cm mass inferior to Left Kidney] ; II [facility]; Dr.Grover.   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  female pleasant patient above history of relapsed diffuse large B-cell lymphoma currently 4 cycles of R-GD-carboplatin is here for follow-up.    Unfortunately PET scan not approved by insurance.  Complains of mild fatigue.  No nausea no vomiting.  No abdominal pain.  Patient is not sure if she wants to go to autologous stem cell transplant given her frail status; and overall not so good outcomes.  Patient complains of mild intermittent diarrhea 1-2 loose stools a day.  No abdominal cramping.  No blood in stools.  Review of Systems  Constitutional: Positive for malaise/fatigue. Negative for chills, diaphoresis, fever and weight loss.  HENT: Negative for nosebleeds and sore throat.   Eyes: Negative for double vision.  Respiratory: Negative for cough, hemoptysis, sputum production, shortness of breath and wheezing.   Cardiovascular: Negative for chest pain,  palpitations, orthopnea and leg swelling.  Gastrointestinal: Positive for diarrhea. Negative for abdominal pain, blood in stool, constipation, heartburn, melena, nausea and vomiting.  Genitourinary: Negative for dysuria, frequency and urgency.  Musculoskeletal: Negative for back pain and joint pain.  Skin: Negative.  Negative for itching and rash.  Neurological: Positive for tingling. Negative for dizziness, focal weakness, weakness and headaches.  Endo/Heme/Allergies: Does not bruise/bleed easily.  Psychiatric/Behavioral: Negative for depression. The patient is not nervous/anxious and does not have insomnia.       PAST MEDICAL HISTORY :  Past Medical History:  Diagnosis Date  . Diabetes mellitus without complication ([place])   . Heart murmur   . History of chemotherapy 07/08/2017  . History of gastric ulcer   . History of radiation therapy 07/08/2017  . Hypercholesterolemia   . Hypertension   . ILD (interstitial lung disease) ([place])    8 yrs ago  . Lymphadenopathy     PAST SURGICAL HISTORY :   Past Surgical History:  Procedure Laterality Date  . BREAST BIOPSY Left 2010   neg  . CATARACT EXTRACTION W/PHACO Left 10/16/2017   Procedure: CATARACT EXTRACTION PHACO AND INTRAOCULAR LENS PLACEMENT ([place]) COMPLICATED DIABETIC LEFT;  Surgeon: Leandrew Koyanagi, MD;  Location: [facility];  Service: Ophthalmology;  Laterality: Left;  IRIS HOOKS Diabetic - oral meds  . HERNIA REPAIR    . PARTIAL HYSTERECTOMY     age 42  . PERIPHERAL VASCULAR CATHETERIZATION N/A 08/22/2016   Procedure: [name];  Surgeon: [name], MD;  Location: [facility];  Service: Cardiovascular;  Laterality: N/A;  . TONSILLECTOMY  FAMILY HISTORY :   Family History  Problem Relation Age of Onset  . Heart disease Mother   . Heart disease Father   . Heart disease Brother     SOCIAL HISTORY:   Social History   Tobacco Use  . Smoking status: Former Smoker    Last  attempt to quit: 08/21/1958    Years since quitting: 59.5  . Smokeless tobacco: Never Used  Substance Use Topics  . Alcohol use: No  . Drug use: No    ALLERGIES:  is allergic to levaquin [levofloxacin in d5w]; cefuroxime axetil; and doxycycline.  MEDICATIONS:  Current Outpatient Medications  Medication Sig Dispense Refill  . b complex vitamins tablet Take 1 tablet by mouth daily.    Marland Kitchen gabapentin (NEURONTIN) 300 MG capsule Take 1 capsule (300 mg total) by mouth 2 (two) times daily. 180 capsule 4  . latanoprost (XALATAN) 0.005 % ophthalmic solution Place 1 drop into both eyes at bedtime.     . lidocaine-prilocaine (EMLA) cream Apply cream 1 hour before chemotherapy treatment, place small amount of saran wrap over cream to protect clothing. 30 g 1  . Magnesium 400 MG TABS Take 1 tablet by mouth daily.    . metFORMIN (GLUCOPHAGE) 500 MG tablet Take 250 mg by mouth daily with breakfast.     . metoprolol tartrate (LOPRESSOR) 25 MG tablet Take 1 tablet (25 mg total) by mouth 2 (two) times daily. 180 tablet 4  . montelukast (SINGULAIR) 10 MG tablet One a day. 30 tablet 3  . ondansetron (ZOFRAN) 8 MG tablet One pill every 8 hours as needed for nausea/vomitting. 40 tablet 1  . pantoprazole (PROTONIX) 40 MG tablet TAKE 1 TABLET BY MOUTH TWICE DAILY    . pravastatin (PRAVACHOL) 40 MG tablet Take 1 tablet by mouth daily.    . prochlorperazine (COMPAZINE) 10 MG tablet Take 1 tablet (10 mg total) by mouth every 6 (six) hours as needed for nausea, vomiting or refractory nausea / vomiting. 40 tablet 0  . protein supplement shake (PREMIER PROTEIN) LIQD Take 325 mLs (11 oz total) by mouth 2 (two) times daily between meals. 60 Can 0   No current facility-administered medications for this visit.    Facility-Administered Medications Ordered in Other Visits  Medication Dose Route Frequency Provider Last Rate Last Dose  . 0.9 %  sodium chloride infusion   Intravenous Continuous Cammie Sickle, MD 10  mL/hr at 12/05/16 1030    . 0.9 %  sodium chloride infusion   Intravenous Continuous Cammie Sickle, MD   Stopped at 12/26/16 1011  . 0.9 %  sodium chloride infusion   Intravenous Continuous Charlaine Dalton R, MD 10 mL/hr at 12/27/16 0930    . heparin lock flush 100 unit/mL  500 Units Intravenous Once Charlaine Dalton R, MD      . heparin lock flush 100 unit/mL  500 Units Intravenous Once Charlaine Dalton R, MD      . sodium chloride flush (NS) 0.9 % injection 10 mL  10 mL Intravenous PRN Cammie Sickle, MD   10 mL at 11/06/16 1000  . sodium chloride flush (NS) 0.9 % injection 10 mL  10 mL Intravenous PRN Cammie Sickle, MD   10 mL at 11/07/16 0905  . sodium chloride flush (NS) 0.9 % injection 10 mL  10 mL Intravenous PRN Charlaine Dalton R, MD      . sodium chloride flush (NS) 0.9 % injection 10 mL  10 mL Intravenous PRN  Cammie Sickle, MD   10 mL at 12/05/16 1030  . sodium chloride flush (NS) 0.9 % injection 10 mL  10 mL Intravenous PRN Cammie Sickle, MD   10 mL at 12/27/16 0930    PHYSICAL EXAMINATION: ECOG PERFORMANCE STATUS: 1 - Symptomatic but completely ambulatory  BP 123/79 (BP Location: Left Arm, Patient Position: Sitting)   Pulse 82   Temp (!) 97.3 F (36.3 C) (Tympanic)   Resp 16   Wt 150 lb 12.8 oz (68.4 kg)   BMI 23.62 kg/m   Filed Weights   02/12/18 1034  Weight: 150 lb 12.8 oz (68.4 kg)    GENERAL: Well-nourished well-developed; Alert, no distress and comfortable. Accompanied by family.  EYES: no pallor or icterus OROPHARYNX: no thrush or ulceration; NECK: supple; no lymph nodes felt. LYMPH:  no palpable lymphadenopathy in the axillary or inguinal regions LUNGS: Decreased breath sounds auscultation bilaterally. No wheeze or crackles HEART/CVS: regular rate & rhythm and no murmurs; No lower extremity edema ABDOMEN:abdomen soft, non-tender and normal bowel sounds. No hepatomegaly or splenomegaly.  Musculoskeletal:no  cyanosis of digits and no clubbing  PSYCH: alert & oriented x 3 with fluent speech NEURO: no focal motor/sensory deficits SKIN:  no rashes or significant lesions    LABORATORY DATA:  I have reviewed the data as listed    Component Value Date/Time   NA 141 02/12/2018 0955   K 3.9 02/12/2018 0955   K 4.1 07/01/2014 1429   CL 107 02/12/2018 0955   CO2 26 02/12/2018 0955   GLUCOSE 126 (H) 02/12/2018 0955   BUN 7 (L) 02/12/2018 0955   CREATININE 0.42 (L) 02/12/2018 0955   CALCIUM 9.2 02/12/2018 0955   PROT 6.4 (L) 02/12/2018 0955   ALBUMIN 3.9 02/12/2018 0955   AST 23 02/12/2018 0955   ALT 17 02/12/2018 0955   ALKPHOS 69 02/12/2018 0955   BILITOT 0.3 02/12/2018 0955   GFRNONAA >60 02/12/2018 0955   GFRAA >60 02/12/2018 0955    No results found for: SPEP, UPEP  Lab Results  Component Value Date   WBC 6.4 02/12/2018   NEUTROABS 4.5 02/12/2018   HGB 10.8 (L) 02/12/2018   HCT 31.9 (L) 02/12/2018   MCV 99.5 02/12/2018   PLT 139 (L) 02/12/2018      Chemistry      Component Value Date/Time   NA 141 02/12/2018 0955   K 3.9 02/12/2018 0955   K 4.1 07/01/2014 1429   CL 107 02/12/2018 0955   CO2 26 02/12/2018 0955   BUN 7 (L) 02/12/2018 0955   CREATININE 0.42 (L) 02/12/2018 0955      Component Value Date/Time   CALCIUM 9.2 02/12/2018 0955   ALKPHOS 69 02/12/2018 0955   AST 23 02/12/2018 0955   ALT 17 02/12/2018 0955   BILITOT 0.3 02/12/2018 0955       RADIOGRAPHIC STUDIES: I have personally reviewed the radiological images as listed and agreed with the findings in the report. Nm Pet Image Restag (ps) Skull Base To Thigh  Result Date: 02/19/2018 CLINICAL DATA:  Subsequent treatment strategy for large B-cell lymphoma. Last chemotherapy 2 weeks ago. EXAM: NUCLEAR MEDICINE PET SKULL BASE TO THIGH TECHNIQUE: 8.06 mCi F-18 FDG was injected intravenously. Full-ring PET imaging was performed from the skull base to thigh after the radiotracer. CT data was obtained and used  for attenuation correction and anatomic localization. Fasting blood glucose: 116 mg/dl COMPARISON:  PET-CT 12/30/2017 and 10/22/2017. FINDINGS: Mediastinal blood pool activity: SUV max 2.0  NECK: No hypermetabolic cervical lymph nodes are identified.There are no lesions of the pharyngeal mucosal space. Incidental CT findings: Mild left maxillary sinus mucosal thickening. CHEST: There are no hypermetabolic mediastinal, hilar or axillary lymph nodes. No hypermetabolic pulmonary activity. Incidental CT findings: Right IJ Port-A-Cath extends to the superior right atrium, stable. There is cardiomegaly and atherosclerosis of the aorta, great vessels and coronary arteries. There is a moderate size hiatal hernia. Fairly extensive basilar predominant pulmonary fibrotic changes are stable. ABDOMEN/PELVIS: There is no hypermetabolic activity within the liver, adrenal glands, spleen or pancreas. There are no discrete hypermetabolic abdominopelvic lymph nodes. Low-level hypermetabolic left periaortic activity is again noted (SUV max 3.9), similar to previous study and without clear CT correlate. Left infrarenal soft tissue mass measures 2.5 x 1.8 cm on image 156/3 and remains hypometabolic (SUV max 1.3). This was previously hypermetabolic and larger. No new abnormal activity. Incidental CT findings: Aortic and branch vessel atherosclerosis. SKELETON: There is no hypermetabolic activity to suggest osseous metastatic disease. Decreased activity in the upper lumbar spine attributed to prior radiation therapy. Incidental CT findings: none IMPRESSION: 1. Further regression in size of left infrarenal soft tissue mass. This mass remains hypometabolic, consistent with treated lymphoma. No evidence of local recurrence. 2. Stable low-level left periaortic activity in the mid abdomen without clear CT correlate. 3. No abnormal activity in the neck or chest. 4. Aortic Atherosclerosis (ICD10-I70.0). Pulmonary fibrotic changes. Electronically  Signed   By: Richardean Sale M.D.   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  #Patient currently status post 4 cycles; clinical no evidence of progression.  However PET scan denied by insurance/awaiting approval.  #Discussed with the patient regarding further options regarding autologous stem cell transplant; patient is not sure if she wants to go to the procedure given her comorbidities etc. patient is considering evaluation at [facility].  #I discussed with the patient that if she is not interested in curative option of the transplant; then her go to palliative-and I would recommend 2 more cycles of current therapy with R-GDP.  Patient is willing to go to chemotherapy at this time   #Peripheral neuropathy/left foot drop-stable.  #Diarrhea grade 1 intermittent unclear etiology if worsen to let us know; recommend checking GI PCR/see if testing  # follow up in 2 weeks/labs-gem-carbo; 3 weeks-R-gem-cbc.   Addendum: PET scan approved by insurance-PET scan shows continued partial response of the left infrarenal lesion-continues to be hypommetabolic.   # cc; Dr.Woods.    Orders Placed This Encounter  Procedures  . CBC with Differential/Platelet    Standing Status:   Future    Standing Expiration Date:   02/13/2019  . Comprehensive metabolic panel    Standing Status:   Future    Standing Expiration Date:   02/13/2019   All questions were answered. The patient knows to call the clinic with any problems, questions or concerns.      Cammie Sickle, MD 02/20/2018 10:14 PM

## 2018-02-19 ENCOUNTER — Inpatient Hospital Stay: Admission: RE | Admit: 2018-02-19 | Payer: Medicare Other | Source: Ambulatory Visit

## 2018-02-19 ENCOUNTER — Ambulatory Visit
Admission: RE | Admit: 2018-02-19 | Discharge: 2018-02-19 | Disposition: A | Discharge status: Home / Self Care | Payer: Medicare Other | Source: Ambulatory Visit | Attending: Internal Medicine | Admitting: Internal Medicine

## 2018-02-19 DIAGNOSIS — I700 Atherosclerosis of aorta: Secondary | ICD-10-CM | POA: Insufficient documentation

## 2018-02-19 DIAGNOSIS — J8410 Pulmonary fibrosis, unspecified: Secondary | ICD-10-CM | POA: Diagnosis not present

## 2018-02-19 DIAGNOSIS — C8333 Diffuse large B-cell lymphoma, intra-abdominal lymph nodes: Secondary | ICD-10-CM | POA: Diagnosis not present

## 2018-02-19 LAB — GLUCOSE, CAPILLARY: Glucose-Capillary: 116 mg/dL — ABNORMAL HIGH (ref 70–99)

## 2018-02-19 MED ORDER — FLUDEOXYGLUCOSE F - 18 (FDG) INJECTION
8.0600 | Freq: Once | INTRAVENOUS | Status: AC | PRN
  Administered 2018-02-19: 8.06 via INTRAVENOUS

## 2018-02-20 ENCOUNTER — Telehealth: Payer: Self-pay | Admitting: Internal Medicine

## 2018-02-20 NOTE — Telephone Encounter (Signed)
Please inform patient that her PET scan shows-continued/improved response.  Please tell the patient that I have reached out to Dr. Malva Cogan stem cell transplant at [facility].  For now, we will proceed with chemotherapy as planned/discussed last visit.  Thanks. GB

## 2018-02-21 NOTE — Telephone Encounter (Signed)
Patient notified of Dr. Aletha Halim recommendations and verbalized understanding.

## 2018-02-26 ENCOUNTER — Inpatient Hospital Stay: Payer: Medicare Other | Admitting: Internal Medicine

## 2018-02-26 ENCOUNTER — Encounter: Payer: Self-pay | Admitting: Internal Medicine

## 2018-02-26 ENCOUNTER — Inpatient Hospital Stay: Payer: Medicare Other

## 2018-02-26 ENCOUNTER — Other Ambulatory Visit: Payer: Self-pay

## 2018-02-26 ENCOUNTER — Inpatient Hospital Stay: Payer: Medicare Other | Attending: Internal Medicine

## 2018-02-26 VITALS — BP 118/76 | HR 82 | Temp 97.6°F | Resp 20 | Ht 67.0 in | Wt 150.0 lb

## 2018-02-26 DIAGNOSIS — J849 Interstitial pulmonary disease, unspecified: Secondary | ICD-10-CM | POA: Insufficient documentation

## 2018-02-26 DIAGNOSIS — Z87891 Personal history of nicotine dependence: Secondary | ICD-10-CM

## 2018-02-26 DIAGNOSIS — Z5112 Encounter for antineoplastic immunotherapy: Secondary | ICD-10-CM | POA: Diagnosis not present

## 2018-02-26 DIAGNOSIS — M21372 Foot drop, left foot: Secondary | ICD-10-CM

## 2018-02-26 DIAGNOSIS — G629 Polyneuropathy, unspecified: Secondary | ICD-10-CM

## 2018-02-26 DIAGNOSIS — Z7984 Long term (current) use of oral hypoglycemic drugs: Secondary | ICD-10-CM | POA: Insufficient documentation

## 2018-02-26 DIAGNOSIS — C8333 Diffuse large B-cell lymphoma, intra-abdominal lymph nodes: Secondary | ICD-10-CM

## 2018-02-26 DIAGNOSIS — Z79899 Other long term (current) drug therapy: Secondary | ICD-10-CM

## 2018-02-26 DIAGNOSIS — R5381 Other malaise: Secondary | ICD-10-CM

## 2018-02-26 DIAGNOSIS — R5383 Other fatigue: Secondary | ICD-10-CM | POA: Diagnosis not present

## 2018-02-26 DIAGNOSIS — E7800 Pure hypercholesterolemia, unspecified: Secondary | ICD-10-CM

## 2018-02-26 DIAGNOSIS — I10 Essential (primary) hypertension: Secondary | ICD-10-CM

## 2018-02-26 DIAGNOSIS — R002 Palpitations: Secondary | ICD-10-CM | POA: Insufficient documentation

## 2018-02-26 DIAGNOSIS — R197 Diarrhea, unspecified: Secondary | ICD-10-CM | POA: Diagnosis not present

## 2018-02-26 DIAGNOSIS — Z5111 Encounter for antineoplastic chemotherapy: Secondary | ICD-10-CM | POA: Insufficient documentation

## 2018-02-26 DIAGNOSIS — E119 Type 2 diabetes mellitus without complications: Secondary | ICD-10-CM | POA: Insufficient documentation

## 2018-02-26 LAB — CBC WITH DIFFERENTIAL/PLATELET
Basophils Absolute: 0.1 10*3/uL (ref 0–0.1)
Basophils Relative: 1 %
EOS ABS: 0.3 10*3/uL (ref 0–0.7)
Eosinophils Relative: 6 %
HEMATOCRIT: 33.3 % — AB (ref 35.0–47.0)
HEMOGLOBIN: 11.3 g/dL — AB (ref 12.0–16.0)
LYMPHS ABS: 0.7 10*3/uL — AB (ref 1.0–3.6)
LYMPHS PCT: 14 %
MCH: 33.2 pg (ref 26.0–34.0)
MCHC: 34 g/dL (ref 32.0–36.0)
MCV: 97.6 fL (ref 80.0–100.0)
MONOS PCT: 19 %
Monocytes Absolute: 0.9 10*3/uL (ref 0.2–0.9)
NEUTROS ABS: 2.9 10*3/uL (ref 1.4–6.5)
Neutrophils Relative %: 60 %
Platelets: 205 10*3/uL (ref 150–440)
RBC: 3.41 MIL/uL — AB (ref 3.80–5.20)
RDW: 17.5 % — ABNORMAL HIGH (ref 11.5–14.5)
WBC: 4.9 10*3/uL (ref 3.6–11.0)

## 2018-02-26 LAB — COMPREHENSIVE METABOLIC PANEL
ALT: 10 U/L (ref 0–44)
ANION GAP: 9 (ref 5–15)
AST: 19 U/L (ref 15–41)
Albumin: 4 g/dL (ref 3.5–5.0)
Alkaline Phosphatase: 51 U/L (ref 38–126)
BUN: 11 mg/dL (ref 8–23)
CO2: 23 mmol/L (ref 22–32)
Calcium: 8.9 mg/dL (ref 8.9–10.3)
Chloride: 108 mmol/L (ref 98–111)
Creatinine, Ser: 0.47 mg/dL (ref 0.44–1.00)
GFR calc Af Amer: >60 mL/min (ref >60)
Glucose, Bld: 155 mg/dL — ABNORMAL HIGH (ref 70–99)
POTASSIUM: 3.9 mmol/L (ref 3.5–5.1)
SODIUM: 140 mmol/L (ref 135–145)
Total Bilirubin: 0.5 mg/dL (ref 0.3–1.2)
Total Protein: 6.4 g/dL — ABNORMAL LOW (ref 6.5–8.1)

## 2018-02-26 MED ORDER — PALONOSETRON HCL INJECTION 0.25 MG/5ML
0.2500 mg | Freq: Once | INTRAVENOUS | Status: AC
  Administered 2018-02-26: 0.25 mg via INTRAVENOUS

## 2018-02-26 MED ORDER — SODIUM CHLORIDE 0.9 % IV SOLN
1800.0000 mg | Freq: Once | INTRAVENOUS | Status: AC
  Administered 2018-02-26: 1800 mg via INTRAVENOUS
  Filled 2018-02-26: qty 26.3

## 2018-02-26 MED ORDER — SODIUM CHLORIDE 0.9 % IV SOLN
Freq: Once | INTRAVENOUS | Status: DC
  Filled 2018-02-26: qty 5

## 2018-02-26 MED ORDER — SODIUM CHLORIDE 0.9 % IV SOLN
412.0000 mg | Freq: Once | INTRAVENOUS | Status: AC
  Administered 2018-02-26: 410 mg via INTRAVENOUS
  Filled 2018-02-26: qty 41

## 2018-02-26 MED ORDER — HEPARIN SOD (PORK) LOCK FLUSH 100 UNIT/ML IV SOLN
500.0000 [IU] | Freq: Once | INTRAVENOUS | Status: AC | PRN
  Administered 2018-02-26: 500 [IU]

## 2018-02-26 MED ORDER — SODIUM CHLORIDE 0.9 % IV SOLN
Freq: Once | INTRAVENOUS | Status: AC
  Administered 2018-02-26: 10:00:00 via INTRAVENOUS
  Filled 2018-02-26: qty 5

## 2018-02-26 MED ORDER — SODIUM CHLORIDE 0.9 % IV SOLN
Freq: Once | INTRAVENOUS | Status: AC
  Administered 2018-02-26: 10:00:00 via INTRAVENOUS
  Filled 2018-02-26: qty 1000

## 2018-02-26 NOTE — Assessment & Plan Note (Addendum)
RECURRENT DLBCL-  triple hit diffuse large B-cell lymphoma [ March 2019] currently on RGDP [carboplatin]; post 4 cycles July 3rd PET -PR/improved.  # Proceed with cycle # 5 today; Labs today reviewed;  acceptable for treatment today. discussed with Dr[facility]Woods at [facility] who also feels ambivalent on offering stem cell transplant given her interstitial lung disease/age and frailty; also based on patient's reluctance.  For now we will hold off stem cell transplant evaluation.   #4 normal plan would be total of 6 cycles of chemotherapy; and then continue surveillance imaging given her high risk disease.   #However, I have asked the patient to call Dr. Madelin Rear office regarding further evaluation of car T-cell therapy down the line as in general she is high risk of recurrence.   #Peripheral neuropathy/left foot drop-stable.  # ILD-currently stable however; recommend PFTs.  Patient agrees.  #Diarrhea grade 1 - better.   # follow up in 2 weeks/labs-gem-carbo; 3 weeks-R-gem-cbc.    # I reviewed the blood work- with the patient in detail; also reviewed the imaging independently [as summarized above]; and with the patient in detail.   # 40 minutes face-to-face with the patient discussing the above plan of care; more than 50% of time spent on prognosis/ natural history; counseling and coordination.  # cc; Dr.Woods.

## 2018-02-26 NOTE — Progress Notes (Signed)
[facility] OFFICE PROGRESS NOTE  Patient Care Team: Rusty Aus, [name]  Cancer Staging No matching staging information was found for the patient.   Oncology History   DEC 2017- DLBCL "TRIPLE HIT [myc/ bcl-2/bcl-6 gene rearrangement FISH]" ~10 cm mass RP LN; STAGE II [jan 2018- BMBx-NEG]; Jan 8th R-CHOP;   # JAN 29th 2018- DA-R EPOCH x5 ; PET CR; s/p RT [last 03/18/2017]  # FEB-MARCH 2019- RECURRENCE of DLBCL [s/p RP Bx; 4 cm mass inferior to Left Kidney] ; II [facility]; Dr.Grover.   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  female pleasant patient above history of relapsed diffuse large B-cell lymphoma currently on R GDP/carboplatin chemotherapy status post 4 cycles he is here to review the results of her PET scan.  Patient complains of mild fatigue.  Otherwise improved.  Also complains of diarrhea a few days after chemotherapy current resolved.  Chronic mild tingling not any worse.  Review of Systems  Constitutional: Positive for malaise/fatigue. Negative for chills, diaphoresis, fever and weight loss.  HENT: Negative for nosebleeds and sore throat.   Eyes: Negative for double vision.  Respiratory: Negative for cough, hemoptysis, sputum production, shortness of breath and wheezing.   Cardiovascular: Negative for chest pain, palpitations, orthopnea and leg swelling.  Gastrointestinal: Negative for abdominal pain, blood in stool, constipation, diarrhea, heartburn, melena, nausea and  vomiting.  Genitourinary: Negative for dysuria, frequency and urgency.  Musculoskeletal: Negative for back pain and joint pain.  Skin: Negative.  Negative for itching and rash.  Neurological: Positive for tingling. Negative for dizziness, focal weakness, weakness and headaches.  Endo/Heme/Allergies: Does not bruise/bleed easily.  Psychiatric/Behavioral: Negative for depression. The patient is not nervous/anxious and does not have insomnia.       PAST MEDICAL HISTORY :  Past Medical History:  Diagnosis Date  . Diabetes mellitus without complication ([place])   . Heart murmur   . History of chemotherapy 07/08/2017  . History of gastric ulcer   . History of radiation therapy 07/08/2017  . Hypercholesterolemia   . Hypertension   . ILD (interstitial lung disease) ([place])    8 yrs ago  . Lymphadenopathy     PAST SURGICAL HISTORY :   Past Surgical History:  Procedure Laterality Date  . BREAST BIOPSY Left 2010   neg  . CATARACT EXTRACTION W/PHACO Left 10/16/2017   Procedure: CATARACT EXTRACTION PHACO AND INTRAOCULAR LENS PLACEMENT ([place]) COMPLICATED DIABETIC LEFT;  Surgeon: Leandrew Koyanagi, MD;  Location: [place];  Service: Ophthalmology;  Laterality: Left;  IRIS HOOKS Diabetic - oral meds  . HERNIA REPAIR    . PARTIAL HYSTERECTOMY     age 72  . PERIPHERAL VASCULAR CATHETERIZATION N/A 08/22/2016   Procedure: [name];  Surgeon: [name], MD;  Location: [facility];  Service: Cardiovascular;  Laterality: N/A;  . TONSILLECTOMY      FAMILY HISTORY :   Family History  Problem Relation Age of Onset  . Heart disease Mother   . Heart disease Father   . Heart disease Brother  SOCIAL HISTORY:   Social History   Tobacco Use  . Smoking status: Former Smoker    Last attempt to quit: 08/21/1958    Years since quitting: 59.5  . Smokeless tobacco: Never Used  Substance Use Topics  . Alcohol use: No  . Drug use: No    ALLERGIES:  is allergic  to levaquin [levofloxacin in d5w]; cefuroxime axetil; and doxycycline.  MEDICATIONS:  Current Outpatient Medications  Medication Sig Dispense Refill  . b complex vitamins tablet Take 1 tablet by mouth daily.    Marland Kitchen gabapentin (NEURONTIN) 300 MG capsule Take 1 capsule (300 mg total) by mouth 2 (two) times daily. 180 capsule 4  . latanoprost (XALATAN) 0.005 % ophthalmic solution Place 1 drop into both eyes at bedtime.     . lidocaine-prilocaine (EMLA) cream Apply cream 1 hour before chemotherapy treatment, place small amount of saran wrap over cream to protect clothing. 30 g 1  . Magnesium 400 MG TABS Take 1 tablet by mouth daily.    . metFORMIN (GLUCOPHAGE) 500 MG tablet Take 250 mg by mouth daily with breakfast.     . metoprolol tartrate (LOPRESSOR) 25 MG tablet Take 1 tablet (25 mg total) by mouth 2 (two) times daily. 180 tablet 4  . montelukast (SINGULAIR) 10 MG tablet One a day. 30 tablet 3  . ondansetron (ZOFRAN) 8 MG tablet One pill every 8 hours as needed for nausea/vomitting. 40 tablet 1  . pantoprazole (PROTONIX) 40 MG tablet TAKE 1 TABLET BY MOUTH TWICE DAILY    . pravastatin (PRAVACHOL) 40 MG tablet Take 1 tablet by mouth daily.    . prochlorperazine (COMPAZINE) 10 MG tablet Take 1 tablet (10 mg total) by mouth every 6 (six) hours as needed for nausea, vomiting or refractory nausea / vomiting. 40 tablet 0  . protein supplement shake (PREMIER PROTEIN) LIQD Take 325 mLs (11 oz total) by mouth 2 (two) times daily between meals. 60 Can 0   No current facility-administered medications for this visit.    Facility-Administered Medications Ordered in Other Visits  Medication Dose Route Frequency Provider Last Rate Last Dose  . 0.9 %  sodium chloride infusion   Intravenous Continuous Cammie Sickle, MD 10 mL/hr at 12/05/16 1030    . 0.9 %  sodium chloride infusion   Intravenous Continuous Cammie Sickle, MD   Stopped at 12/26/16 1011  . 0.9 %  sodium chloride infusion    Intravenous Continuous Charlaine Dalton R, MD 10 mL/hr at 12/27/16 0930    . heparin lock flush 100 unit/mL  500 Units Intravenous Once Charlaine Dalton R, MD      . heparin lock flush 100 unit/mL  500 Units Intravenous Once Charlaine Dalton R, MD      . sodium chloride flush (NS) 0.9 % injection 10 mL  10 mL Intravenous PRN Cammie Sickle, MD   10 mL at 11/06/16 1000  . sodium chloride flush (NS) 0.9 % injection 10 mL  10 mL Intravenous PRN Cammie Sickle, MD   10 mL at 11/07/16 0905  . sodium chloride flush (NS) 0.9 % injection 10 mL  10 mL Intravenous PRN Charlaine Dalton R, MD      . sodium chloride flush (NS) 0.9 % injection 10 mL  10 mL Intravenous PRN Cammie Sickle, MD   10 mL at 12/05/16 1030  . sodium chloride flush (NS) 0.9 % injection 10 mL  10 mL Intravenous PRN Cammie Sickle, MD   10  mL at 12/27/16 0930    PHYSICAL EXAMINATION: ECOG PERFORMANCE STATUS: 1 - Symptomatic but completely ambulatory  BP 118/76   Pulse 82   Temp 97.6 F (36.4 C) (Tympanic)   Resp 20   Ht 5\' 7"  (1.702 m)   Wt 150 lb (68 kg)   BMI 23.49 kg/m   Filed Weights   02/26/18 0814  Weight: 150 lb (68 kg)    GENERAL: Well-nourished well-developed; Alert, no distress and comfortable.  Accompanied by family.  EYES: no pallor or icterus OROPHARYNX: no thrush or ulceration; NECK: supple; no lymph nodes felt. LYMPH:  no palpable lymphadenopathy in the axillary or inguinal regions LUNGS: Decreased breath sounds auscultation bilaterally. No wheeze or crackles HEART/CVS: regular rate & rhythm and no murmurs; No lower extremity edema ABDOMEN:abdomen soft, non-tender and normal bowel sounds. No hepatomegaly or splenomegaly.  Musculoskeletal:no cyanosis of digits and no clubbing  PSYCH: alert & oriented x 3 with fluent speech NEURO: no focal motor/sensory deficits SKIN:  no rashes or significant lesions    LABORATORY DATA:  I have reviewed the data as listed     Component Value Date/Time   NA 140 02/26/2018 0905   K 3.9 02/26/2018 0905   K 4.1 07/01/2014 1429   CL 108 02/26/2018 0905   CO2 23 02/26/2018 0905   GLUCOSE 155 (H) 02/26/2018 0905   BUN 11 02/26/2018 0905   CREATININE 0.47 02/26/2018 0905   CALCIUM 8.9 02/26/2018 0905   PROT 6.4 (L) 02/26/2018 0905   ALBUMIN 4.0 02/26/2018 0905   AST 19 02/26/2018 0905   ALT 10 02/26/2018 0905   ALKPHOS 51 02/26/2018 0905   BILITOT 0.5 02/26/2018 0905   GFRNONAA >60 02/26/2018 0905   GFRAA >60 02/26/2018 0905    No results found for: SPEP, UPEP  Lab Results  Component Value Date   WBC 4.9 02/26/2018   NEUTROABS 2.9 02/26/2018   HGB 11.3 (L) 02/26/2018   HCT 33.3 (L) 02/26/2018   MCV 97.6 02/26/2018   PLT 205 02/26/2018      Chemistry      Component Value Date/Time   NA 140 02/26/2018 0905   K 3.9 02/26/2018 0905   K 4.1 07/01/2014 1429   CL 108 02/26/2018 0905   CO2 23 02/26/2018 0905   BUN 11 02/26/2018 0905   CREATININE 0.47 02/26/2018 0905      Component Value Date/Time   CALCIUM 8.9 02/26/2018 0905   ALKPHOS 51 02/26/2018 0905   AST 19 02/26/2018 0905   ALT 10 02/26/2018 0905   BILITOT 0.5 02/26/2018 0905       RADIOGRAPHIC STUDIES: I have personally reviewed the radiological images as listed and agreed with the findings in the report. No results found.   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  # Proceed with cycle # 5 today; Labs today reviewed;  acceptable for treatment today. discussed with Dr[facility]Woods at [facility] who also feels ambivalent on offering stem cell transplant given her interstitial lung disease/age and frailty; also based on patient's reluctance.  For now we will hold off stem cell transplant evaluation.   #4 normal plan would be total of 6 cycles of  chemotherapy; and then continue surveillance imaging given her high risk disease.   #However, I have asked the patient to call Dr. Madelin Rear office regarding further  evaluation of car T-cell therapy down the line as in general she is high risk of recurrence.   #Peripheral neuropathy/left foot drop-stable.  # ILD-currently stable however; recommend PFTs.  Patient agrees.  #Diarrhea grade 1 - better.   # follow up in 2 weeks/labs-gem-carbo; 3 weeks-R-gem-cbc.    # I reviewed the blood work- with the patient in detail; also reviewed the imaging independently [as summarized above]; and with the patient in detail.   # 40 minutes face-to-face with the patient discussing the above plan of care; more than 50% of time spent on prognosis/ natural history; counseling and coordination.  # cc; Dr.Woods.    Orders Placed This Encounter  Procedures  . Pulmonary Function Test [facility] Only    Standing Status:   Future    Standing Expiration Date:   02/27/2019    Order Specific Question:   Full PFT: includes the following: basic spirometry, spirometry pre & post bronchodilator, diffusion capacity (DLCO), lung volumes    Answer:   Full PFT    Order Specific Question:   MIP/MEP    Answer:   Yes    Order Specific Question:   6 minute walk    Answer:   No    Order Specific Question:   ABG    Answer:   No    Order Specific Question:   Diffusion capacity (DLCO)    Answer:   Yes    Order Specific Question:   Lung volumes    Answer:   Yes    Order Specific Question:   Methacholine challenge    Answer:   No    Order Specific Question:   This test can only be performed at    Answer:   [place] questions were answered. The patient knows to call the clinic with any problems, questions or concerns.      Cammie Sickle, MD 03/01/2018 8:48 AM

## 2018-03-03 DIAGNOSIS — I700 Atherosclerosis of aorta: Secondary | ICD-10-CM | POA: Insufficient documentation

## 2018-03-03 DIAGNOSIS — E1169 Type 2 diabetes mellitus with other specified complication: Secondary | ICD-10-CM | POA: Insufficient documentation

## 2018-03-05 ENCOUNTER — Other Ambulatory Visit: Payer: Self-pay | Admitting: Internal Medicine

## 2018-03-05 ENCOUNTER — Other Ambulatory Visit: Payer: Self-pay

## 2018-03-05 ENCOUNTER — Inpatient Hospital Stay: Payer: Medicare Other

## 2018-03-05 ENCOUNTER — Inpatient Hospital Stay (HOSPITAL_BASED_OUTPATIENT_CLINIC_OR_DEPARTMENT_OTHER): Payer: Medicare Other | Admitting: Oncology

## 2018-03-05 VITALS — BP 108/65 | HR 61 | Temp 96.3°F | Resp 18 | Wt 150.1 lb

## 2018-03-05 DIAGNOSIS — C8333 Diffuse large B-cell lymphoma, intra-abdominal lymph nodes: Secondary | ICD-10-CM

## 2018-03-05 DIAGNOSIS — Z79899 Other long term (current) drug therapy: Secondary | ICD-10-CM

## 2018-03-05 DIAGNOSIS — R5381 Other malaise: Secondary | ICD-10-CM | POA: Diagnosis not present

## 2018-03-05 DIAGNOSIS — E119 Type 2 diabetes mellitus without complications: Secondary | ICD-10-CM

## 2018-03-05 DIAGNOSIS — R5383 Other fatigue: Secondary | ICD-10-CM | POA: Diagnosis not present

## 2018-03-05 DIAGNOSIS — E7800 Pure hypercholesterolemia, unspecified: Secondary | ICD-10-CM

## 2018-03-05 DIAGNOSIS — R002 Palpitations: Secondary | ICD-10-CM | POA: Diagnosis not present

## 2018-03-05 DIAGNOSIS — J849 Interstitial pulmonary disease, unspecified: Secondary | ICD-10-CM

## 2018-03-05 DIAGNOSIS — Z7984 Long term (current) use of oral hypoglycemic drugs: Secondary | ICD-10-CM

## 2018-03-05 DIAGNOSIS — Z87891 Personal history of nicotine dependence: Secondary | ICD-10-CM

## 2018-03-05 DIAGNOSIS — M21372 Foot drop, left foot: Secondary | ICD-10-CM

## 2018-03-05 DIAGNOSIS — R197 Diarrhea, unspecified: Secondary | ICD-10-CM

## 2018-03-05 DIAGNOSIS — I10 Essential (primary) hypertension: Secondary | ICD-10-CM

## 2018-03-05 DIAGNOSIS — G629 Polyneuropathy, unspecified: Secondary | ICD-10-CM

## 2018-03-05 LAB — CBC WITH DIFFERENTIAL/PLATELET
BASOS ABS: 0 10*3/uL (ref 0–0.1)
BASOS PCT: 1 %
Eosinophils Absolute: 0.2 10*3/uL (ref 0–0.7)
Eosinophils Relative: 7 %
HEMATOCRIT: 33.4 % — AB (ref 35.0–47.0)
Hemoglobin: 11.7 g/dL — ABNORMAL LOW (ref 12.0–16.0)
Lymphocytes Relative: 16 %
Lymphs Abs: 0.6 10*3/uL — ABNORMAL LOW (ref 1.0–3.6)
MCH: 33.2 pg (ref 26.0–34.0)
MCHC: 34.9 g/dL (ref 32.0–36.0)
MCV: 95.1 fL (ref 80.0–100.0)
Monocytes Absolute: 0.4 10*3/uL (ref 0.2–0.9)
Monocytes Relative: 11 %
NEUTROS ABS: 2.4 10*3/uL (ref 1.4–6.5)
NEUTROS PCT: 65 %
Platelets: 149 10*3/uL — ABNORMAL LOW (ref 150–440)
RBC: 3.52 MIL/uL — ABNORMAL LOW (ref 3.80–5.20)
RDW: 16.3 % — ABNORMAL HIGH (ref 11.5–14.5)
WBC: 3.6 10*3/uL (ref 3.6–11.0)

## 2018-03-05 MED ORDER — SODIUM CHLORIDE 0.9 % IV SOLN: 375.0000 mg/m2 | Freq: Once | INTRAVENOUS | Status: DC

## 2018-03-05 MED ORDER — PROCHLORPERAZINE MALEATE 10 MG PO TABS
10.0000 mg | ORAL_TABLET | Freq: Once | ORAL | Status: AC
  Administered 2018-03-05: 10 mg via ORAL
  Filled 2018-03-05: qty 1

## 2018-03-05 MED ORDER — ACETAMINOPHEN 325 MG PO TABS
650.0000 mg | ORAL_TABLET | Freq: Once | ORAL | Status: AC
  Administered 2018-03-05: 650 mg via ORAL
  Filled 2018-03-05: qty 2

## 2018-03-05 MED ORDER — SODIUM CHLORIDE 0.9% FLUSH
10.0000 mL | INTRAVENOUS | Status: DC | PRN
  Filled 2018-03-05: qty 10

## 2018-03-05 MED ORDER — SODIUM CHLORIDE 0.9 % IV SOLN
375.0000 mg/m2 | Freq: Once | INTRAVENOUS | Status: AC
  Administered 2018-03-05: 700 mg via INTRAVENOUS
  Filled 2018-03-05: qty 50

## 2018-03-05 MED ORDER — DIPHENHYDRAMINE HCL 25 MG PO CAPS
25.0000 mg | ORAL_CAPSULE | Freq: Once | ORAL | Status: AC
  Administered 2018-03-05: 25 mg via ORAL
  Filled 2018-03-05: qty 1

## 2018-03-05 MED ORDER — SODIUM CHLORIDE 0.9 % IV SOLN
Freq: Once | INTRAVENOUS | Status: AC
  Administered 2018-03-05: 10:00:00 via INTRAVENOUS
  Filled 2018-03-05: qty 1000

## 2018-03-05 MED ORDER — HEPARIN SOD (PORK) LOCK FLUSH 100 UNIT/ML IV SOLN
500.0000 [IU] | Freq: Once | INTRAVENOUS | Status: AC | PRN
  Administered 2018-03-05: 500 [IU]
  Filled 2018-03-05: qty 5

## 2018-03-05 MED ORDER — SODIUM CHLORIDE 0.9 % IV SOLN
1800.0000 mg | Freq: Once | INTRAVENOUS | Status: AC
  Administered 2018-03-05: 1800 mg via INTRAVENOUS
  Filled 2018-03-05: qty 26.3

## 2018-03-05 NOTE — Progress Notes (Signed)
Per Dr. Rogue Bussing okay to proceed with treatment with CBC only.  During assessment, Pt states she feels like her heart is "skipping/fluttering" She said it has happened before but has never reported it or got it checked out!. Dr. Rogue Bussing made aware. Rulon Abide NP at chairside to assess pt, EKG performed Per order and NP reviewed. Per Rulon Abide NP per Dr. Rogue Bussing okay to proceed with treatment.

## 2018-03-05 NOTE — Progress Notes (Signed)
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 Rogue Bussing on 02/26/2018 prior to cycle #5 of RGDP.  Most recent PET scan from February 19, 2018 revealed marked improvement in disease.  She was asked to call Dr. Sherral Hammers office regarding further evaluation of car T-cell therapy down the line should she recur.  Her peripheral neuropathy and left foot drop was stable, diarrhea was improved.  She was scheduled to return to clinic in 2 weeks with labs.   Oncology History   DEC 2017- DLBCL "TRIPLE HIT [myc/ bcl-2/bcl-6 gene rearrangement FISH]" ~10 cm mass RP LN; STAGE II [jan 2018- BMBx-NEG]; Jan 8th R-CHOP;   # JAN 29th 2018- DA-R EPOCH x5 ; PET CR; s/p RT [last 03/18/2017]  # FEB-MARCH 2019- RECURRENCE of DLBCL [s/p RP Bx; 4 cm mass inferior to Left Kidney] ; II OPINION [facility]; Dr.Grover.   # April 3rd 2019- R-GDP [carbo]; MAY 2019- PET PR  --------------------------------  # JAN 26th-LP  # Interstitial Lung disease [surveillance] --------------------------------------------------------------------------  DIAGNOSIS: [facility] 2019 ] RECURRENT DLBCL  STAGE:   Recurrent ; GOALS: CURATIVE  CURRENT/MOST RECENT THERAPY [April 3rd 2019]- R-GDP [CARBO]      Diffuse large B-cell lymphoma of intra-abdominal lymph nodes (HCC)   Interval history-  Patient complains of palpitations and rapid heart beat.  The symptoms are of mild severity, occuring intermittently and lasting 1 minute per episode. Cardiac risk factors include: advanced age (older than 70 for men, 70 for women), diabetes mellitus and  hypertension. Aggravating factors: none. Relieving factors: spontaneous. Associated signs and symptoms: has no complaint(s) of chest pain, dyspnea and fatigue  ECOG FS:1 - Symptomatic but completely ambulatory  Review of systems- Review of Systems  Constitutional: Negative.  Negative for chills, fever, malaise/fatigue and weight loss.  HENT: Negative for congestion and ear pain.   Eyes: Negative.  Negative for blurred vision and double vision.  Respiratory: Negative.  Negative for cough, sputum production and shortness of breath.   Cardiovascular: Positive for palpitations. Negative for chest pain and leg swelling.  Gastrointestinal: Negative.  Negative for abdominal pain, constipation, diarrhea, nausea and vomiting.  Genitourinary: Negative for dysuria, frequency and urgency.  Musculoskeletal: Negative for back pain and falls.  Skin: Negative.  Negative for rash.  Neurological: Negative.  Negative for weakness and headaches.  Endo/Heme/Allergies: Negative.  Does not bruise/bleed easily.  Psychiatric/Behavioral: Negative.  Negative for depression. The patient is not nervous/anxious and does not have insomnia.      Current treatment- S/p 4 cycles of RGDP  Allergies  Allergen Reactions  . Levaquin [Levofloxacin In D5w] Palpitations and Other (See Comments)    Burning in lower legs  . Cefuroxime Axetil Diarrhea    Other reaction(s): Abdominal Pain  . Doxycycline     Other reaction(s): Abdominal Pain severe     Past Medical History:  Diagnosis Date  . Diabetes mellitus without complication ([place])   . Heart murmur   . History of chemotherapy 07/08/2017  . History of gastric ulcer   . History of radiation therapy 07/08/2017  . Hypercholesterolemia   . Hypertension   . ILD (interstitial lung disease) (  [place])    8 yrs ago  . Lymphadenopathy      Past Surgical History:  Procedure Laterality Date  . BREAST BIOPSY Left 2010   neg  . CATARACT EXTRACTION W/PHACO Left 10/16/2017    Procedure: CATARACT EXTRACTION PHACO AND INTRAOCULAR LENS PLACEMENT ([place]) COMPLICATED DIABETIC LEFT;  Surgeon: Leandrew Koyanagi, MD;  Location: [facility];  Service: Ophthalmology;  Laterality: Left;  IRIS HOOKS Diabetic - oral meds  . HERNIA REPAIR    . PARTIAL HYSTERECTOMY     age 28  . PERIPHERAL VASCULAR CATHETERIZATION N/A 08/22/2016   Procedure: [name];  Surgeon: [name], MD;  Location: [place] [facility];  Service: Cardiovascular;  Laterality: N/A;  . TONSILLECTOMY      Social History   Socioeconomic History  . Marital status: Married    Spouse name: Talaya Lamprecht  . Number of children: 2  . Years of education: Not on file  . Highest education level: Not on file  Occupational History  . Occupation: Retired  Scientific laboratory technician  . Financial resource strain: Not on file  . Food insecurity:    Worry: Not on file    Inability: Not on file  . Transportation needs:    Medical: Not on file    Non-medical: Not on file  Tobacco Use  . Smoking status: Former Smoker    Last attempt to quit: 08/21/1958    Years since quitting: 59.5  . Smokeless tobacco: Never Used  Substance and Sexual Activity  . Alcohol use: No  . Drug use: No  . Sexual activity: Never  Lifestyle  . Physical activity:    Days per week: Not on file    Minutes per session: Not on file  . Stress: Not on file  Relationships  . Social connections:    Talks on phone: Not on file    Gets together: Not on file    Attends religious service: Not on file    Active member of club or organization: Not on file    Attends meetings of clubs or organizations: Not on file    Relationship status: Not on file  . Intimate partner violence:    Fear of current or ex partner: Not on file    Emotionally abused: Not on file    Physically abused: Not on file    Forced sexual activity: Not on file  Other Topics Concern  . Not on file  Social History Narrative   Not applicable at this time     Family History  Problem Relation Age of Onset  . Heart disease Mother   . Heart disease Father   . Heart disease Brother      Current Outpatient Medications:  .  b complex vitamins tablet, Take 1 tablet by mouth daily., Disp: , Rfl:  .  gabapentin (NEURONTIN) 300 MG capsule, Take 1 capsule (300 mg total) by mouth 2 (two) times daily., Disp: 180 capsule, Rfl: 4 .  latanoprost (XALATAN) 0.005 % ophthalmic solution, Place 1 drop into both eyes at bedtime. , Disp: , Rfl:  .  lidocaine-prilocaine (EMLA) cream, Apply cream 1 hour before chemotherapy treatment, place small amount of saran wrap over cream to protect clothing., Disp: 30 g, Rfl: 1 .  Magnesium 400 MG TABS, Take 1 tablet by mouth daily., Disp: , Rfl:  .  metFORMIN (GLUCOPHAGE) 500 MG tablet, Take 250 mg by mouth daily with breakfast. , Disp: , Rfl:  .  metoprolol tartrate (LOPRESSOR) 25  MG tablet, Take 1 tablet (25 mg total) by mouth 2 (two) times daily., Disp: 180 tablet, Rfl: 4 .  montelukast (SINGULAIR) 10 MG tablet, One a day., Disp: 30 tablet, Rfl: 3 .  ondansetron (ZOFRAN) 8 MG tablet, One pill every 8 hours as needed for nausea/vomitting., Disp: 40 tablet, Rfl: 1 .  pantoprazole (PROTONIX) 40 MG tablet, TAKE 1 TABLET BY MOUTH TWICE DAILY, Disp: , Rfl:  .  pravastatin (PRAVACHOL) 40 MG tablet, Take 1 tablet by mouth daily., Disp: , Rfl:  .  prochlorperazine (COMPAZINE) 10 MG tablet, Take 1 tablet (10 mg total) by mouth every 6 (six) hours as needed for nausea, vomiting or refractory nausea / vomiting., Disp: 40 tablet, Rfl: 0 .  protein supplement shake (PREMIER PROTEIN) LIQD, Take 325 mLs (11 oz total) by mouth 2 (two) times daily between meals., Disp: 60 Can, Rfl: 0 No current facility-administered medications for this visit.   Facility-Administered Medications Ordered in Other Visits:  .  0.9 %  sodium chloride infusion, , Intravenous, Continuous, Charlaine Dalton R, MD, Last Rate: 10 mL/hr at 12/05/16 1030 .  0.9 %   sodium chloride infusion, , Intravenous, Continuous, Cammie Sickle, MD, Stopped at 12/26/16 1011 .  0.9 %  sodium chloride infusion, , Intravenous, Continuous, Charlaine Dalton R, MD, Last Rate: 10 mL/hr at 12/27/16 0930 .  heparin lock flush 100 unit/mL, 500 Units, Intravenous, Once, Brahmanday, Lenetta Quaker R, MD .  heparin lock flush 100 unit/mL, 500 Units, Intravenous, Once, Brahmanday, Lenetta Quaker R, MD .  sodium chloride flush (NS) 0.9 % injection 10 mL, 10 mL, Intravenous, PRN, Charlaine Dalton R, MD, 10 mL at 11/06/16 1000 .  sodium chloride flush (NS) 0.9 % injection 10 mL, 10 mL, Intravenous, PRN, Cammie Sickle, MD, 10 mL at 11/07/16 0905 .  sodium chloride flush (NS) 0.9 % injection 10 mL, 10 mL, Intravenous, PRN, Charlaine Dalton R, MD .  sodium chloride flush (NS) 0.9 % injection 10 mL, 10 mL, Intravenous, PRN, Charlaine Dalton R, MD, 10 mL at 12/05/16 1030 .  sodium chloride flush (NS) 0.9 % injection 10 mL, 10 mL, Intravenous, PRN, Charlaine Dalton R, MD, 10 mL at 12/27/16 0930 .  sodium chloride flush (NS) 0.9 % injection 10 mL, 10 mL, Intracatheter, PRN, Cammie Sickle, MD  Physical exam: There were no vitals filed for this visit. Physical Exam  Constitutional: She is oriented to person, place, and time. Vital signs are normal. She appears well-developed and well-nourished.  HENT:  Head: Normocephalic and atraumatic.  Eyes: Pupils are equal, round, and reactive to light.  Neck: Normal range of motion.  Cardiovascular: Normal rate and normal heart sounds. An irregular rhythm present.  No murmur heard. Occasional PVC's  Pulmonary/Chest: Effort normal and breath sounds normal. She has no wheezes.  Abdominal: Soft. Normal appearance and bowel sounds are normal. She exhibits no distension. There is no tenderness.  Musculoskeletal: Normal range of motion. She exhibits no edema.  Neurological: She is alert and oriented to person, place, and time.  Skin:  Skin is warm and dry. No rash noted.  Psychiatric: Judgment normal.     CMP Latest Ref Rng & Units 02/26/2018  Glucose 70 - 99 mg/dL 155(H)  BUN 8 - 23 mg/dL 11  Creatinine 0.44 - 1.00 mg/dL 0.47  Sodium 135 - 145 mmol/L 140  Potassium 3.5 - 5.1 mmol/L 3.9  Chloride 98 - 111 mmol/L 108  CO2 22 - 32 mmol/L 23  Calcium 8.9 - 10.3  mg/dL 8.9  Total Protein 6.5 - 8.1 g/dL 6.4(L)  Total Bilirubin 0.3 - 1.2 mg/dL 0.5  Alkaline Phos 38 - 126 U/L 51  AST 15 - 41 U/L 19  ALT 0 - 44 U/L 10   CBC Latest Ref Rng & Units 03/05/2018  WBC 3.6 - 11.0 K/uL 3.6  Hemoglobin 12.0 - 16.0 g/dL 11.7(L)  Hematocrit 35.0 - 47.0 % 33.4(L)  Platelets 150 - 440 K/uL 149(L)    No images are attached to the encounter.  Nm Pet Image Restag (ps) Skull Base To Thigh  Result Date: 02/19/2018 CLINICAL DATA:  Subsequent treatment strategy for large B-cell lymphoma. Last chemotherapy 2 weeks ago. EXAM: NUCLEAR MEDICINE PET SKULL BASE TO THIGH TECHNIQUE: 8.06 mCi F-18 FDG was injected intravenously. Full-ring PET imaging was performed from the skull base to thigh after the radiotracer. CT data was obtained and used for attenuation correction and anatomic localization. Fasting blood glucose: 116 mg/dl COMPARISON:  PET-CT 12/30/2017 and 10/22/2017. FINDINGS: Mediastinal blood pool activity: SUV max 2.0 NECK: No hypermetabolic cervical lymph nodes are identified.There are no lesions of the pharyngeal mucosal space. Incidental CT findings: Mild left maxillary sinus mucosal thickening. CHEST: There are no hypermetabolic mediastinal, hilar or axillary lymph nodes. No hypermetabolic pulmonary activity. Incidental CT findings: Right IJ Port-A-Cath extends to the superior right atrium, stable. There is cardiomegaly and atherosclerosis of the aorta, great vessels and coronary arteries. There is a moderate size hiatal hernia. Fairly extensive basilar predominant pulmonary fibrotic changes are stable. ABDOMEN/PELVIS: There is no  hypermetabolic activity within the liver, adrenal glands, spleen or pancreas. There are no discrete hypermetabolic abdominopelvic lymph nodes. Low-level hypermetabolic left periaortic activity is again noted (SUV max 3.9), similar to previous study and without clear CT correlate. Left infrarenal soft tissue mass measures 2.5 x 1.8 cm on image 156/3 and remains hypometabolic (SUV max 1.3). This was previously hypermetabolic and larger. No new abnormal activity. Incidental CT findings: Aortic and branch vessel atherosclerosis. SKELETON: There is no hypermetabolic activity to suggest osseous metastatic disease. Decreased activity in the upper lumbar spine attributed to prior radiation therapy. Incidental CT findings: none IMPRESSION: 1. Further regression in size of left infrarenal soft tissue mass. This mass remains hypometabolic, consistent with treated lymphoma. No evidence of local recurrence. 2. Stable low-level left periaortic activity in the mid abdomen without clear CT correlate. 3. No abnormal activity in the neck or chest. 4. Aortic Atherosclerosis (ICD10-I70.0). Pulmonary fibrotic changes. Electronically Signed   By: Richardean Sale M.D.   On: 02/19/2018 15:58     Assessment and plan- Patient is a 72 y.o. female who presents for infusions for lymphoma and states she has noticed palpitations weekly for the past month.   1.  Triple hit B-cell lymphoma: Status post 5 cycles of RGDP. Tolerated well.  Recently evaluated by Dr. Sherral Hammers at [facility] who would like to hold off on altering stem cell transplant given her interstitial lung disease, age and frailty.  Patient is also reluctant.  Plan is to offer 6 cycles of chemotherapy and then continue surveillance with imaging given her high risk disease.  She was asked to contact Dr. Sherral Hammers regarding car T-cell therapy should she need it in the future.  She is scheduled to return to clinic on 03/19/2018 for labs, MD assessment and cycle 6.   2.  Palpitations: Have  occurred 1-2 times per week x4 weeks.  Denies chest pain or shortness of breath.  Stat EKG reveals normal sinus rhythm with possible  left atrial enlargement and left ventricular hypertrophy.  Has history of hypertension and hypercholesterolemia.  Currently on metoprolol and pravastatin.  Does not see a cardiologist at this time.  She also has diabetes and is on metformin.  May need cardiology referral in the future.  Consulted Dr. Rogue Bussing and okay for treatment today.   Visit Diagnosis 1. Heart palpitations   2. Diffuse large B-cell lymphoma of intra-abdominal lymph nodes ([place])     Patient expressed understanding and was in agreement with this plan. She also understands that She can call clinic at any time with any questions, concerns, or complaints.   Greater than 50% was spent in counseling and coordination of care with this patient including but not limited to discussion of the relevant topics above (See A&P) including, but not limited to diagnosis and management of acute and chronic medical conditions.    Faythe Casa, AGNP-C [facility]- [phone] Pager- [phone] 03/05/2018 1:36 PM

## 2018-03-06 ENCOUNTER — Inpatient Hospital Stay: Payer: Medicare Other

## 2018-03-06 ENCOUNTER — Ambulatory Visit: Payer: Medicare Other | Attending: Internal Medicine

## 2018-03-06 DIAGNOSIS — C8333 Diffuse large B-cell lymphoma, intra-abdominal lymph nodes: Secondary | ICD-10-CM | POA: Diagnosis not present

## 2018-03-06 MED ORDER — PEGFILGRASTIM-CBQV 6 MG/0.6ML ~~LOC~~ SOSY
6.0000 mg | PREFILLED_SYRINGE | Freq: Once | SUBCUTANEOUS | Status: AC
  Administered 2018-03-06: 6 mg via SUBCUTANEOUS

## 2018-03-15 ENCOUNTER — Other Ambulatory Visit: Payer: Self-pay | Admitting: Internal Medicine

## 2018-03-19 ENCOUNTER — Other Ambulatory Visit: Payer: Self-pay

## 2018-03-19 ENCOUNTER — Encounter: Payer: Self-pay | Admitting: Internal Medicine

## 2018-03-19 ENCOUNTER — Inpatient Hospital Stay: Payer: Medicare Other

## 2018-03-19 ENCOUNTER — Inpatient Hospital Stay: Payer: Medicare Other | Admitting: Internal Medicine

## 2018-03-19 VITALS — BP 128/69 | HR 87 | Temp 97.2°F | Resp 20 | Ht 67.0 in | Wt 151.5 lb

## 2018-03-19 DIAGNOSIS — M21372 Foot drop, left foot: Secondary | ICD-10-CM

## 2018-03-19 DIAGNOSIS — R5381 Other malaise: Secondary | ICD-10-CM | POA: Diagnosis not present

## 2018-03-19 DIAGNOSIS — E119 Type 2 diabetes mellitus without complications: Secondary | ICD-10-CM

## 2018-03-19 DIAGNOSIS — R5383 Other fatigue: Secondary | ICD-10-CM

## 2018-03-19 DIAGNOSIS — Z95828 Presence of other vascular implants and grafts: Secondary | ICD-10-CM

## 2018-03-19 DIAGNOSIS — Z79899 Other long term (current) drug therapy: Secondary | ICD-10-CM

## 2018-03-19 DIAGNOSIS — Z7984 Long term (current) use of oral hypoglycemic drugs: Secondary | ICD-10-CM

## 2018-03-19 DIAGNOSIS — Z87891 Personal history of nicotine dependence: Secondary | ICD-10-CM

## 2018-03-19 DIAGNOSIS — I10 Essential (primary) hypertension: Secondary | ICD-10-CM

## 2018-03-19 DIAGNOSIS — C8333 Diffuse large B-cell lymphoma, intra-abdominal lymph nodes: Secondary | ICD-10-CM

## 2018-03-19 DIAGNOSIS — G629 Polyneuropathy, unspecified: Secondary | ICD-10-CM

## 2018-03-19 DIAGNOSIS — E7800 Pure hypercholesterolemia, unspecified: Secondary | ICD-10-CM

## 2018-03-19 LAB — CBC WITH DIFFERENTIAL/PLATELET
BASOS PCT: 1 %
Basophils Absolute: 0.1 10*3/uL (ref 0–0.1)
Eosinophils Absolute: 0.2 10*3/uL (ref 0–0.7)
Eosinophils Relative: 3 %
HCT: 33 % — ABNORMAL LOW (ref 35.0–47.0)
Hemoglobin: 11.2 g/dL — ABNORMAL LOW (ref 12.0–16.0)
LYMPHS ABS: 0.6 10*3/uL — AB (ref 1.0–3.6)
Lymphocytes Relative: 9 %
MCH: 33.4 pg (ref 26.0–34.0)
MCHC: 34 g/dL (ref 32.0–36.0)
MCV: 98 fL (ref 80.0–100.0)
Monocytes Absolute: 1.3 10*3/uL — ABNORMAL HIGH (ref 0.2–0.9)
Monocytes Relative: 19 %
Neutro Abs: 4.5 10*3/uL (ref 1.4–6.5)
Neutrophils Relative %: 68 %
Platelets: 122 10*3/uL — ABNORMAL LOW (ref 150–440)
RBC: 3.36 MIL/uL — ABNORMAL LOW (ref 3.80–5.20)
RDW: 16.9 % — AB (ref 11.5–14.5)
WBC: 6.6 10*3/uL (ref 3.6–11.0)

## 2018-03-19 LAB — COMPREHENSIVE METABOLIC PANEL
ALT: 16 U/L (ref 0–44)
AST: 24 U/L (ref 15–41)
Albumin: 4 g/dL (ref 3.5–5.0)
Alkaline Phosphatase: 71 U/L (ref 38–126)
Anion gap: 11 (ref 5–15)
BUN: 11 mg/dL (ref 8–23)
CALCIUM: 9 mg/dL (ref 8.9–10.3)
CHLORIDE: 106 mmol/L (ref 98–111)
CO2: 24 mmol/L (ref 22–32)
Creatinine, Ser: 0.46 mg/dL (ref 0.44–1.00)
GFR calc Af Amer: >60 mL/min (ref >60)
GFR calc non Af Amer: >60 mL/min (ref >60)
Glucose, Bld: 191 mg/dL — ABNORMAL HIGH (ref 70–99)
Potassium: 4 mmol/L (ref 3.5–5.1)
SODIUM: 141 mmol/L (ref 135–145)
Total Bilirubin: 0.5 mg/dL (ref 0.3–1.2)
Total Protein: 6.7 g/dL (ref 6.5–8.1)

## 2018-03-19 MED ORDER — SODIUM CHLORIDE 0.9 % IV SOLN
Freq: Once | INTRAVENOUS | Status: AC
  Administered 2018-03-19: 09:00:00 via INTRAVENOUS
  Filled 2018-03-19: qty 1000

## 2018-03-19 MED ORDER — PALONOSETRON HCL INJECTION 0.25 MG/5ML
0.2500 mg | Freq: Once | INTRAVENOUS | Status: AC
  Administered 2018-03-19: 0.25 mg via INTRAVENOUS

## 2018-03-19 MED ORDER — SODIUM CHLORIDE 0.9 % IV SOLN
1800.0000 mg | Freq: Once | INTRAVENOUS | Status: AC
  Administered 2018-03-19: 1800 mg via INTRAVENOUS
  Filled 2018-03-19: qty 26.3

## 2018-03-19 MED ORDER — SODIUM CHLORIDE 0.9 % IV SOLN
412.0000 mg | Freq: Once | INTRAVENOUS | Status: AC
  Administered 2018-03-19: 410 mg via INTRAVENOUS
  Filled 2018-03-19: qty 41

## 2018-03-19 MED ORDER — SODIUM CHLORIDE 0.9% FLUSH
10.0000 mL | INTRAVENOUS | Status: DC | PRN
  Administered 2018-03-19: 10 mL via INTRAVENOUS
  Filled 2018-03-19: qty 10

## 2018-03-19 MED ORDER — HEPARIN SOD (PORK) LOCK FLUSH 100 UNIT/ML IV SOLN
500.0000 [IU] | Freq: Once | INTRAVENOUS | Status: AC
  Administered 2018-03-19: 500 [IU] via INTRAVENOUS
  Filled 2018-03-19: qty 5

## 2018-03-19 MED ORDER — FOSAPREPITANT DIMEGLUMINE INJECTION 150 MG
Freq: Once | INTRAVENOUS | Status: AC
  Administered 2018-03-19: 10:00:00 via INTRAVENOUS
  Filled 2018-03-19: qty 5

## 2018-03-19 NOTE — Progress Notes (Signed)
[place] OFFICE PROGRESS NOTE  Patient Care Team: Rusty Aus, [name]  Cancer Staging No matching staging information was found for the patient.   Oncology History   DEC 2017- DLBCL "TRIPLE HIT [myc/ bcl-2/bcl-6 gene rearrangement FISH]" ~10 cm mass RP LN; STAGE II [jan 2018- BMBx-NEG]; Jan 8th R-[facility]OP;   # JAN 29th 2018- DA-R EPOCH x5 ; PET CR; s/p RT [last 03/18/2017]  # FEB-MARCH 2019- RECURRENCE of DLBCL [s/p RP Bx; 4 cm mass inferior to Left Kidney] ; II [facility]; Dr.Grover.   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  female pleasant patient above history of recurrent diffuse large B-cell lymphoma is here for follow-up.  Patient denies any diarrhea.  Denies any nausea vomiting.  Complains of fatigue.  No new shortness of breath or cough.   Review of Systems  Constitutional: Negative for chills, diaphoresis, fever, malaise/fatigue and weight loss.  HENT: Negative for nosebleeds and sore throat.   Eyes: Negative for double vision.  Respiratory: Negative for cough, hemoptysis, sputum production, shortness of breath and wheezing.   Cardiovascular: Negative for chest pain, palpitations, orthopnea and leg swelling.  Gastrointestinal: Negative for abdominal pain, blood in stool, constipation, diarrhea, heartburn, melena, nausea and vomiting.  Genitourinary: Negative for dysuria, frequency and urgency.  Musculoskeletal: Negative for back pain and joint pain.  Skin: Negative.  Negative  for itching and rash.  Neurological: Negative for dizziness, tingling, focal weakness, weakness and headaches.  Endo/Heme/Allergies: Does not bruise/bleed easily.  Psychiatric/Behavioral: Negative for depression. The patient is not nervous/anxious and does not have insomnia.       PAST MEDICAL HISTORY :  Past Medical History:  Diagnosis Date  . Diabetes mellitus without complication ([place])   . Heart murmur   . History of chemotherapy 07/08/2017  . History of gastric ulcer   . History of radiation therapy 07/08/2017  . Hypercholesterolemia   . Hypertension   . ILD (interstitial lung disease) ([place])    8 yrs ago  . Lymphadenopathy     PAST SURGICAL HISTORY :   Past Surgical History:  Procedure Laterality Date  . BREAST BIOPSY Left 2010   neg  . CATARACT EXTRACTION W/PHACO Left 10/16/2017   Procedure: CATARACT EXTRACTION PHACO AND INTRAOCULAR LENS PLACEMENT ([place]) COMPLICATED DIABETIC LEFT;  Surgeon: Leandrew Koyanagi, MD;  Location: [facility];  Service: Ophthalmology;  Laterality: Left;  IRIS HOOKS Diabetic - oral meds  . HERNIA REPAIR    . PARTIAL HYSTERECTOMY     age 35  . PERIPHERAL VASCULAR CATHETERIZATION N/A 08/22/2016   Procedure: [name];  Surgeon: [name], MD;  Location: [facility] CV [facility];  Service: Cardiovascular;  Laterality: N/A;  . TONSILLECTOMY      FAMILY HISTORY :   Family History  Problem Relation Age of Onset  . Heart disease Mother   . Heart disease Father   . Heart disease Brother     SOCIAL HISTORY:   Social History   Tobacco Use  . Smoking status: Former Smoker    Last attempt to  quit: 08/21/1958    Years since quitting: 59.6  . Smokeless tobacco: Never Used  Substance Use Topics  . Alcohol use: No  . Drug use: No    ALLERGIES:  is allergic to levaquin [levofloxacin in d5w]; cefuroxime axetil; and doxycycline.  MEDICATIONS:  Current Outpatient Medications  Medication Sig Dispense Refill  . b complex  vitamins tablet Take 1 tablet by mouth daily.    Marland Kitchen dexamethasone (DECADRON) 4 MG tablet TAKE 10 TABLETS BY MOUTH ONCE DAILY FOR 4 DAYS. START THE DAY AFTER CHEMO, EVERY 3 WEEKS 40 tablet 4  . gabapentin (NEURONTIN) 300 MG capsule Take 1 capsule (300 mg total) by mouth 2 (two) times daily. 180 capsule 4  . latanoprost (XALATAN) 0.005 % ophthalmic solution Place 1 drop into both eyes at bedtime.     . lidocaine-prilocaine (EMLA) cream Apply cream 1 hour before chemotherapy treatment, place small amount of saran wrap over cream to protect clothing. 30 g 1  . Magnesium 400 MG TABS Take 1 tablet by mouth daily.    . metFORMIN (GLUCOPHAGE) 500 MG tablet Take 250 mg by mouth daily with breakfast.     . metoprolol tartrate (LOPRESSOR) 25 MG tablet Take 1 tablet (25 mg total) by mouth 2 (two) times daily. 180 tablet 4  . montelukast (SINGULAIR) 10 MG tablet One a day. 30 tablet 3  . ondansetron (ZOFRAN) 8 MG tablet One pill every 8 hours as needed for nausea/vomitting. 40 tablet 1  . pantoprazole (PROTONIX) 40 MG tablet TAKE 1 TABLET BY MOUTH TWICE DAILY    . pravastatin (PRAVACHOL) 40 MG tablet Take 1 tablet by mouth daily.    . prochlorperazine (COMPAZINE) 10 MG tablet Take 1 tablet (10 mg total) by mouth every 6 (six) hours as needed for nausea, vomiting or refractory nausea / vomiting. 40 tablet 0  . protein supplement shake (PREMIER PROTEIN) LIQD Take 325 mLs (11 oz total) by mouth 2 (two) times daily between meals. 60 Can 0   No current facility-administered medications for this visit.    Facility-Administered Medications Ordered in Other Visits  Medication Dose Route Frequency Provider Last Rate Last Dose  . 0.9 %  sodium chloride infusion   Intravenous Continuous Cammie Sickle, MD 10 mL/hr at 12/05/16 1030    . 0.9 %  sodium chloride infusion   Intravenous Continuous Cammie Sickle, MD   Stopped at 12/26/16 1011  . 0.9 %  sodium chloride infusion   Intravenous Continuous  Charlaine Dalton R, MD 10 mL/hr at 12/27/16 0930    . heparin lock flush 100 unit/mL  500 Units Intravenous Once Charlaine Dalton R, MD      . heparin lock flush 100 unit/mL  500 Units Intravenous Once Charlaine Dalton R, MD      . sodium chloride flush (NS) 0.9 % injection 10 mL  10 mL Intravenous PRN Cammie Sickle, MD   10 mL at 11/06/16 1000  . sodium chloride flush (NS) 0.9 % injection 10 mL  10 mL Intravenous PRN Cammie Sickle, MD   10 mL at 11/07/16 0905  . sodium chloride flush (NS) 0.9 % injection 10 mL  10 mL Intravenous PRN Charlaine Dalton R, MD      . sodium chloride flush (NS) 0.9 % injection 10 mL  10 mL Intravenous PRN Cammie Sickle, MD   10 mL at 12/05/16 1030  . sodium chloride flush (NS) 0.9 % injection 10 mL  10 mL Intravenous PRN Jaiveer Panas,  Elisha Headland, MD   10 mL at 12/27/16 0930    PHYSICAL EXAMINATION: ECOG PERFORMANCE STATUS: 1 - Symptomatic but completely ambulatory  BP 128/69 (BP Location: Left Arm, Patient Position: Sitting)   Pulse 87   Temp (!) 97.2 F (36.2 C) (Tympanic)   Resp 20   Ht 5\' 7"  (1.702 m)   Wt 151 lb 8 oz (68.7 kg)   BMI 23.73 kg/m   Filed Weights   03/19/18 0816  Weight: 151 lb 8 oz (68.7 kg)    GENERAL: Well-nourished well-developed; Alert, no distress and comfortable.  Accompanied by husband. EYES: no pallor or icterus OROPHARYNX: no thrush or ulceration; NECK: supple; no lymph nodes felt. LYMPH:  no palpable lymphadenopathy in the axillary or inguinal regions LUNGS: Decreased breath sounds auscultation bilaterally. No wheeze or crackles HEART/CVS: regular rate & rhythm and no murmurs; No lower extremity edema ABDOMEN:abdomen soft, non-tender and normal bowel sounds. No hepatomegaly or splenomegaly.  Musculoskeletal:no cyanosis of digits and no clubbing  PSYCH: alert & oriented x 3 with fluent speech NEURO: no focal motor/sensory deficits SKIN:  no rashes or significant lesions    LABORATORY  DATA:  I have reviewed the data as listed    Component Value Date/Time   NA 141 03/19/2018 0802   K 4.0 03/19/2018 0802   K 4.1 07/01/2014 1429   CL 106 03/19/2018 0802   CO2 24 03/19/2018 0802   GLUCOSE 191 (H) 03/19/2018 0802   BUN 11 03/19/2018 0802   CREATININE 0.46 03/19/2018 0802   CALCIUM 9.0 03/19/2018 0802   PROT 6.7 03/19/2018 0802   ALBUMIN 4.0 03/19/2018 0802   AST 24 03/19/2018 0802   ALT 16 03/19/2018 0802   ALKPHOS 71 03/19/2018 0802   BILITOT 0.5 03/19/2018 0802   GFRNONAA >60 03/19/2018 0802   GFRAA >60 03/19/2018 0802    No results found for: SPEP, UPEP  Lab Results  Component Value Date   WBC 5.0 03/26/2018   NEUTROABS 3.6 03/26/2018   HGB 11.6 (L) 03/26/2018   HCT 33.7 (L) 03/26/2018   MCV 96.5 03/26/2018   PLT 142 (L) 03/26/2018      Chemistry      Component Value Date/Time   NA 141 03/19/2018 0802   K 4.0 03/19/2018 0802   K 4.1 07/01/2014 1429   CL 106 03/19/2018 0802   CO2 24 03/19/2018 0802   BUN 11 03/19/2018 0802   CREATININE 0.46 03/19/2018 0802      Component Value Date/Time   CALCIUM 9.0 03/19/2018 0802   ALKPHOS 71 03/19/2018 0802   AST 24 03/19/2018 0802   ALT 16 03/19/2018 0802   BILITOT 0.5 03/19/2018 0802       RADIOGRAPHIC STUDIES: I have personally reviewed the radiological images as listed and agreed with the findings in the report. No results found.   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  Stable  # Proceed with cycle # 6-day 1 today; Labs today reviewed;  acceptable for treatment today. And then continue surveillance imaging given her high risk disease.  again discussed re: CART as an option at relapse.    #Peripheral neuropathy/left foot drop-stable  # ILD-currently stable E; awaiting PFTs in 1 months or so.   #  follow up 1 week as planned for chemo labs/ 8-8- udenyca. Follow up  in 1 months/labs- NO chemo.    No orders of the defined types were placed in this encounter.  All questions were answered. The patient knows to call the clinic with any problems, questions or concerns.      Cammie Sickle, MD 03/30/2018 6:53 PM

## 2018-03-19 NOTE — Assessment & Plan Note (Addendum)
RECURRENT DLBCL-  triple hit diffuse large B-cell lymphoma [ March 2019] currently on RGDP [carboplatin]; post 4 cycles July 3rd PET -PR/improved.  Stable  # Proceed with cycle # 6-day 1 today; Labs today reviewed;  acceptable for treatment today. And then continue surveillance imaging given her high risk disease.  again discussed re: CART as an option at relapse.    #Peripheral neuropathy/left foot drop-stable  # ILD-currently stable E; awaiting PFTs in 1 months or so.   # follow up 1 week as planned for chemo labs/ 8-8- udenyca. Follow up in 1 months/labs- NO chemo.

## 2018-03-24 ENCOUNTER — Other Ambulatory Visit: Payer: Self-pay | Admitting: *Deleted

## 2018-03-24 DIAGNOSIS — C8333 Diffuse large B-cell lymphoma, intra-abdominal lymph nodes: Secondary | ICD-10-CM

## 2018-03-26 ENCOUNTER — Inpatient Hospital Stay: Payer: Medicare Other

## 2018-03-26 ENCOUNTER — Inpatient Hospital Stay: Payer: Medicare Other | Attending: Internal Medicine

## 2018-03-26 VITALS — BP 145/72 | HR 79 | Temp 97.7°F | Resp 18 | Wt 147.8 lb

## 2018-03-26 DIAGNOSIS — Z5112 Encounter for antineoplastic immunotherapy: Secondary | ICD-10-CM | POA: Diagnosis present

## 2018-03-26 DIAGNOSIS — C8333 Diffuse large B-cell lymphoma, intra-abdominal lymph nodes: Secondary | ICD-10-CM | POA: Diagnosis present

## 2018-03-26 DIAGNOSIS — Z5111 Encounter for antineoplastic chemotherapy: Secondary | ICD-10-CM | POA: Insufficient documentation

## 2018-03-26 LAB — CBC WITH DIFFERENTIAL/PLATELET
BASOS PCT: 0 %
Basophils Absolute: 0 10*3/uL (ref 0–0.1)
Eosinophils Absolute: 0.1 10*3/uL (ref 0–0.7)
Eosinophils Relative: 3 %
HEMATOCRIT: 33.7 % — AB (ref 35.0–47.0)
Hemoglobin: 11.6 g/dL — ABNORMAL LOW (ref 12.0–16.0)
Lymphocytes Relative: 10 %
Lymphs Abs: 0.5 10*3/uL — ABNORMAL LOW (ref 1.0–3.6)
MCH: 33.1 pg (ref 26.0–34.0)
MCHC: 34.3 g/dL (ref 32.0–36.0)
MCV: 96.5 fL (ref 80.0–100.0)
MONO ABS: 0.8 10*3/uL (ref 0.2–0.9)
MONOS PCT: 15 %
NEUTROS ABS: 3.6 10*3/uL (ref 1.4–6.5)
Neutrophils Relative %: 72 %
Platelets: 142 10*3/uL — ABNORMAL LOW (ref 150–440)
RBC: 3.49 MIL/uL — ABNORMAL LOW (ref 3.80–5.20)
RDW: 16.3 % — AB (ref 11.5–14.5)
WBC: 5 10*3/uL (ref 3.6–11.0)

## 2018-03-26 MED ORDER — SODIUM CHLORIDE 0.9% FLUSH
10.0000 mL | INTRAVENOUS | Status: DC | PRN
  Administered 2018-03-26: 10 mL
  Filled 2018-03-26: qty 10

## 2018-03-26 MED ORDER — SODIUM CHLORIDE 0.9 % IV SOLN: 375.0000 mg/m2 | Freq: Once | INTRAVENOUS | Status: DC

## 2018-03-26 MED ORDER — DIPHENHYDRAMINE HCL 25 MG PO CAPS
25.0000 mg | ORAL_CAPSULE | Freq: Once | ORAL | Status: AC
  Administered 2018-03-26: 25 mg via ORAL
  Filled 2018-03-26: qty 1

## 2018-03-26 MED ORDER — ACETAMINOPHEN 325 MG PO TABS
650.0000 mg | ORAL_TABLET | Freq: Once | ORAL | Status: AC
  Administered 2018-03-26: 650 mg via ORAL
  Filled 2018-03-26: qty 2

## 2018-03-26 MED ORDER — SODIUM CHLORIDE 0.9 % IV SOLN
1800.0000 mg | Freq: Once | INTRAVENOUS | Status: AC
  Administered 2018-03-26: 1800 mg via INTRAVENOUS
  Filled 2018-03-26: qty 26.3

## 2018-03-26 MED ORDER — PROCHLORPERAZINE MALEATE 10 MG PO TABS
10.0000 mg | ORAL_TABLET | Freq: Once | ORAL | Status: AC
  Administered 2018-03-26: 10 mg via ORAL
  Filled 2018-03-26: qty 1

## 2018-03-26 MED ORDER — SODIUM CHLORIDE 0.9 % IV SOLN
375.0000 mg/m2 | Freq: Once | INTRAVENOUS | Status: AC
  Administered 2018-03-26: 700 mg via INTRAVENOUS
  Filled 2018-03-26: qty 50

## 2018-03-26 MED ORDER — SODIUM CHLORIDE 0.9 % IV SOLN
Freq: Once | INTRAVENOUS | Status: AC
  Administered 2018-03-26: 09:00:00 via INTRAVENOUS
  Filled 2018-03-26: qty 1000

## 2018-03-26 MED ORDER — HEPARIN SOD (PORK) LOCK FLUSH 100 UNIT/ML IV SOLN
500.0000 [IU] | Freq: Once | INTRAVENOUS | Status: AC | PRN
  Administered 2018-03-26: 500 [IU]
  Filled 2018-03-26: qty 5

## 2018-03-26 NOTE — Progress Notes (Signed)
Reviewed labs with Dr. Rogue Bussing, proceed with Rituxan and Gemzar today.

## 2018-03-27 ENCOUNTER — Inpatient Hospital Stay: Payer: Medicare Other

## 2018-03-27 DIAGNOSIS — C8333 Diffuse large B-cell lymphoma, intra-abdominal lymph nodes: Secondary | ICD-10-CM

## 2018-03-27 DIAGNOSIS — Z5112 Encounter for antineoplastic immunotherapy: Secondary | ICD-10-CM | POA: Diagnosis not present

## 2018-03-27 MED ORDER — PEGFILGRASTIM-CBQV 6 MG/0.6ML ~~LOC~~ SOSY
6.0000 mg | PREFILLED_SYRINGE | Freq: Once | SUBCUTANEOUS | Status: AC
  Administered 2018-03-27: 6 mg via SUBCUTANEOUS

## 2018-04-18 ENCOUNTER — Other Ambulatory Visit: Payer: Self-pay | Admitting: Internal Medicine

## 2018-04-18 DIAGNOSIS — C8333 Diffuse large B-cell lymphoma, intra-abdominal lymph nodes: Secondary | ICD-10-CM

## 2018-04-23 ENCOUNTER — Inpatient Hospital Stay: Payer: Medicare Other

## 2018-04-23 ENCOUNTER — Other Ambulatory Visit: Payer: Self-pay

## 2018-04-23 ENCOUNTER — Inpatient Hospital Stay: Payer: Medicare Other | Attending: Internal Medicine | Admitting: Internal Medicine

## 2018-04-23 ENCOUNTER — Encounter: Payer: Self-pay | Admitting: Internal Medicine

## 2018-04-23 VITALS — BP 134/84 | HR 91 | Temp 98.8°F | Resp 20 | Ht 67.0 in | Wt 147.0 lb

## 2018-04-23 DIAGNOSIS — C8333 Diffuse large B-cell lymphoma, intra-abdominal lymph nodes: Secondary | ICD-10-CM | POA: Diagnosis not present

## 2018-04-23 DIAGNOSIS — E1140 Type 2 diabetes mellitus with diabetic neuropathy, unspecified: Secondary | ICD-10-CM

## 2018-04-23 DIAGNOSIS — R110 Nausea: Secondary | ICD-10-CM | POA: Insufficient documentation

## 2018-04-23 DIAGNOSIS — J849 Interstitial pulmonary disease, unspecified: Secondary | ICD-10-CM

## 2018-04-23 DIAGNOSIS — Z87891 Personal history of nicotine dependence: Secondary | ICD-10-CM | POA: Diagnosis not present

## 2018-04-23 DIAGNOSIS — M21372 Foot drop, left foot: Secondary | ICD-10-CM

## 2018-04-23 DIAGNOSIS — Z23 Encounter for immunization: Secondary | ICD-10-CM | POA: Diagnosis not present

## 2018-04-23 DIAGNOSIS — Z7984 Long term (current) use of oral hypoglycemic drugs: Secondary | ICD-10-CM | POA: Diagnosis not present

## 2018-04-23 DIAGNOSIS — Z9221 Personal history of antineoplastic chemotherapy: Secondary | ICD-10-CM | POA: Insufficient documentation

## 2018-04-23 DIAGNOSIS — R51 Headache: Secondary | ICD-10-CM | POA: Insufficient documentation

## 2018-04-23 LAB — COMPREHENSIVE METABOLIC PANEL
ALBUMIN: 4.1 g/dL (ref 3.5–5.0)
ALK PHOS: 64 U/L (ref 38–126)
ALT: 12 U/L (ref 0–44)
ANION GAP: 9 (ref 5–15)
AST: 18 U/L (ref 15–41)
BUN: 11 mg/dL (ref 8–23)
CALCIUM: 9.3 mg/dL (ref 8.9–10.3)
CHLORIDE: 104 mmol/L (ref 98–111)
CO2: 27 mmol/L (ref 22–32)
Creatinine, Ser: 0.46 mg/dL (ref 0.44–1.00)
GFR calc Af Amer: >60 mL/min (ref >60)
GFR calc non Af Amer: >60 mL/min (ref >60)
GLUCOSE: 118 mg/dL — AB (ref 70–99)
POTASSIUM: 3.9 mmol/L (ref 3.5–5.1)
SODIUM: 140 mmol/L (ref 135–145)
Total Bilirubin: 0.4 mg/dL (ref 0.3–1.2)
Total Protein: 6.9 g/dL (ref 6.5–8.1)

## 2018-04-23 LAB — CBC WITH DIFFERENTIAL/PLATELET
BASOS ABS: 0.1 10*3/uL (ref 0–0.1)
BASOS PCT: 1 %
EOS PCT: 6 %
Eosinophils Absolute: 0.3 10*3/uL (ref 0–0.7)
HCT: 32.8 % — ABNORMAL LOW (ref 35.0–47.0)
Hemoglobin: 10.9 g/dL — ABNORMAL LOW (ref 12.0–16.0)
Lymphocytes Relative: 11 %
Lymphs Abs: 0.6 10*3/uL — ABNORMAL LOW (ref 1.0–3.6)
MCH: 31.9 pg (ref 26.0–34.0)
MCHC: 33.4 g/dL (ref 32.0–36.0)
MCV: 95.5 fL (ref 80.0–100.0)
MONO ABS: 1.3 10*3/uL — AB (ref 0.2–0.9)
Monocytes Relative: 23 %
NEUTROS ABS: 3.5 10*3/uL (ref 1.4–6.5)
Neutrophils Relative %: 59 %
PLATELETS: 221 10*3/uL (ref 150–440)
RBC: 3.43 MIL/uL — ABNORMAL LOW (ref 3.80–5.20)
RDW: 16.7 % — AB (ref 11.5–14.5)
WBC: 5.8 10*3/uL (ref 3.6–11.0)

## 2018-04-23 NOTE — Progress Notes (Signed)
[place] OFFICE PROGRESS NOTE  Patient Care Team: Rusty Aus, [name]  Cancer Staging No matching staging information was found for the patient.   Oncology History   DEC 2017- DLBCL "TRIPLE HIT [myc/ bcl-2/bcl-6 gene rearrangement FISH]" ~10 cm mass RP LN; STAGE II [jan 2018- BMBx-NEG]; Jan 8th R-[facility];   # JAN 29th 2018- DA-R EPOCH x5 ; PET CR; s/p RT [last 03/18/2017]  # FEB-MARCH 2019- RECURRENCE of DLBCL [s/p RP Bx; 4 cm mass inferior to Left Kidney] ; II [facility]; Dr.Grover.   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  female pleasant patient above history of recurrent diffuse large B-cell lymphoma is here for follow-up.  Patient complains of nausea for the last 2 weeks; every day.  No vomiting.  Complains of intermittent headaches improved with Tylenol.  Also complains of left-sided neck pain.  She denies any significant changes in the foot habits.  Denies any vision changes.  Denies any falls.  Chronic numbness in her feet.   Review of Systems  Constitutional: Negative for chills, diaphoresis, fever, malaise/fatigue and weight loss.  HENT: Negative for nosebleeds and sore throat.   Eyes: Negative for double vision.  Respiratory: Negative for cough, hemoptysis, sputum production, shortness of breath and wheezing.   Cardiovascular: Negative for chest pain, palpitations, orthopnea and leg swelling.  Gastrointestinal: Positive for nausea. Negative for abdominal pain, blood in stool,  constipation, diarrhea, heartburn, melena and vomiting.  Genitourinary: Negative for dysuria, frequency and urgency.  Musculoskeletal: Positive for neck pain. Negative for back pain and joint pain.       Left neck pain  Skin: Negative.  Negative for itching and rash.  Neurological: Positive for dizziness and headaches. Negative for tingling, focal weakness and weakness.  Endo/Heme/Allergies: Does not bruise/bleed easily.  Psychiatric/Behavioral: Negative for depression. The patient is not nervous/anxious and does not have insomnia.       PAST MEDICAL HISTORY :  Past Medical History:  Diagnosis Date  . Diabetes mellitus without complication ([place])   . Heart murmur   . History of chemotherapy 07/08/2017  . History of gastric ulcer   . History of radiation therapy 07/08/2017  . Hypercholesterolemia   . Hypertension   . ILD (interstitial lung disease) ([place])    8 yrs ago  . Lymphadenopathy     PAST SURGICAL HISTORY :   Past Surgical History:  Procedure Laterality Date  . BREAST BIOPSY Left 2010   neg  . CATARACT EXTRACTION W/PHACO Left 10/16/2017   Procedure: CATARACT EXTRACTION PHACO AND INTRAOCULAR LENS PLACEMENT ([place]) COMPLICATED DIABETIC LEFT;  Surgeon: Leandrew Koyanagi, MD;  Location: [facility];  Service: Ophthalmology;  Laterality: Left;  IRIS HOOKS Diabetic - oral meds  . HERNIA REPAIR    . PARTIAL HYSTERECTOMY     age 65  . PERIPHERAL VASCULAR CATHETERIZATION N/A 08/22/2016   Procedure: [name];  Surgeon: [name], MD;  Location: [place] [facility];  Service: Cardiovascular;  Laterality: N/A;  . TONSILLECTOMY      FAMILY HISTORY :  Family History  Problem Relation Age of Onset  . Heart disease Mother   . Heart disease Father   . Heart disease Brother     SOCIAL HISTORY:   Social History   Tobacco Use  . Smoking status: Former Smoker    Last attempt to quit: 08/21/1958    Years since quitting: 59.7  . Smokeless tobacco: Never  Used  Substance Use Topics  . Alcohol use: No  . Drug use: No    ALLERGIES:  is allergic to levaquin [levofloxacin in d5w]; cefuroxime axetil; and doxycycline.  MEDICATIONS:  Current Outpatient Medications  Medication Sig Dispense Refill  . b complex vitamins tablet Take 1 tablet by mouth daily.    Marland Kitchen gabapentin (NEURONTIN) 300 MG capsule Take 1 capsule (300 mg total) by mouth 2 (two) times daily. 180 capsule 4  . latanoprost (XALATAN) 0.005 % ophthalmic solution Place 1 drop into both eyes at bedtime.     . lidocaine-prilocaine (EMLA) cream Apply cream 1 hour before chemotherapy treatment, place small amount of saran wrap over cream to protect clothing. 30 g 1  . Magnesium 400 MG TABS Take 1 tablet by mouth daily.    . metFORMIN (GLUCOPHAGE) 500 MG tablet Take 250 mg by mouth daily with breakfast.     . metoprolol tartrate (LOPRESSOR) 25 MG tablet Take 1 tablet (25 mg total) by mouth 2 (two) times daily. 180 tablet 4  . montelukast (SINGULAIR) 10 MG tablet One a day. 30 tablet 3  . ondansetron (ZOFRAN) 8 MG tablet One pill every 8 hours as needed for nausea/vomitting. 40 tablet 1  . pantoprazole (PROTONIX) 40 MG tablet TAKE 1 TABLET BY MOUTH TWICE DAILY    . pravastatin (PRAVACHOL) 40 MG tablet Take 1 tablet by mouth daily.    . prochlorperazine (COMPAZINE) 10 MG tablet Take 1 tablet (10 mg total) by mouth every 6 (six) hours as needed for nausea, vomiting or refractory nausea / vomiting. (Patient not taking: Reported on 04/23/2018) 40 tablet 0   No current facility-administered medications for this visit.    Facility-Administered Medications Ordered in Other Visits  Medication Dose Route Frequency Provider Last Rate Last Dose  . 0.9 %  sodium chloride infusion   Intravenous Continuous Cammie Sickle, MD 10 mL/hr at 12/05/16 1030    . 0.9 %  sodium chloride infusion   Intravenous Continuous Cammie Sickle, MD   Stopped at 12/26/16 1011  . 0.9 %  sodium chloride infusion    Intravenous Continuous Charlaine Dalton R, MD 10 mL/hr at 12/27/16 0930    . heparin lock flush 100 unit/mL  500 Units Intravenous Once Charlaine Dalton R, MD      . heparin lock flush 100 unit/mL  500 Units Intravenous Once Charlaine Dalton R, MD      . sodium chloride flush (NS) 0.9 % injection 10 mL  10 mL Intravenous PRN Cammie Sickle, MD   10 mL at 11/06/16 1000  . sodium chloride flush (NS) 0.9 % injection 10 mL  10 mL Intravenous PRN Cammie Sickle, MD   10 mL at 11/07/16 0905  . sodium chloride flush (NS) 0.9 % injection 10 mL  10 mL Intravenous PRN Charlaine Dalton R, MD      . sodium chloride flush (NS) 0.9 % injection 10 mL  10 mL Intravenous PRN Cammie Sickle, MD   10 mL at 12/05/16 1030  . sodium chloride flush (NS) 0.9 % injection 10 mL  10  mL Intravenous PRN Cammie Sickle, MD   10 mL at 12/27/16 0930    PHYSICAL EXAMINATION: ECOG PERFORMANCE STATUS: 1 - Symptomatic but completely ambulatory  BP 134/84   Pulse 91   Temp 98.8 F (37.1 C) (Oral)   Resp 20   Ht 5\' 7"  (1.702 m)   Wt 147 lb (66.7 kg)   BMI 23.02 kg/m   Filed Weights   04/23/18 1028  Weight: 147 lb (66.7 kg)    Physical Exam  Constitutional: She is oriented to person, place, and time and well-developed, well-nourished, and in no distress.  Accompanied by her husband.  Walking vessel.  HENT:  Head: Normocephalic and atraumatic.  Mouth/Throat: Oropharynx is clear and moist. No oropharyngeal exudate.  Eyes: Pupils are equal, round, and reactive to light.  Neck: Normal range of motion. Neck supple.  Cardiovascular: Normal rate and regular rhythm.  Pulmonary/Chest: No respiratory distress. She has no wheezes.  Positive for crackles bilateral lung fields at the bases.  Abdominal: Soft. Bowel sounds are normal. She exhibits no distension and no mass. There is no tenderness. There is no rebound and no guarding.  Musculoskeletal: Normal range of motion. She exhibits no  edema or tenderness.  Neurological: She is alert and oriented to person, place, and time.  Skin: Skin is warm.  Psychiatric: Affect normal.       LABORATORY DATA:  I have reviewed the data as listed    Component Value Date/Time   NA 140 04/23/2018 0946   K 3.9 04/23/2018 0946   K 4.1 07/01/2014 1429   CL 104 04/23/2018 0946   CO2 27 04/23/2018 0946   GLUCOSE 118 (H) 04/23/2018 0946   BUN 11 04/23/2018 0946   CREATININE 0.46 04/23/2018 0946   CALCIUM 9.3 04/23/2018 0946   PROT 6.9 04/23/2018 0946   ALBUMIN 4.1 04/23/2018 0946   AST 18 04/23/2018 0946   ALT 12 04/23/2018 0946   ALKPHOS 64 04/23/2018 0946   BILITOT 0.4 04/23/2018 0946   GFRNONAA >60 04/23/2018 0946   GFRAA >60 04/23/2018 0946    No results found for: SPEP, UPEP  Lab Results  Component Value Date   WBC 5.8 04/23/2018   NEUTROABS 3.5 04/23/2018   HGB 10.9 (L) 04/23/2018   HCT 32.8 (L) 04/23/2018   MCV 95.5 04/23/2018   PLT 221 04/23/2018      Chemistry      Component Value Date/Time   NA 140 04/23/2018 0946   K 3.9 04/23/2018 0946   K 4.1 07/01/2014 1429   CL 104 04/23/2018 0946   CO2 27 04/23/2018 0946   BUN 11 04/23/2018 0946   CREATININE 0.46 04/23/2018 0946      Component Value Date/Time   CALCIUM 9.3 04/23/2018 0946   ALKPHOS 64 04/23/2018 0946   AST 18 04/23/2018 0946   ALT 12 04/23/2018 0946   BILITOT 0.4 04/23/2018 0946       RADIOGRAPHIC STUDIES: I have personally reviewed the radiological images as listed and agreed with the findings in the report. No results found.   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  Stable.  Patient finished 6 cycles a on July 31st 2019.    # Clinically STABLE; but see discussion below- re: nausea.  # nausea; intermittent headaches-new-clinically  less likely concerning for lymphomatous  involvement of the CNS.  However patient is high risk of involvement of the CNS-as it is triple hit lymphoma.  As the patient to take Zofran twice a day for the next 1 week; if not improved or worsened symptoms-recommend calling us we will get an MRI of the brain; also might need LP.   #Peripheral neuropathy/left foot drop-stable  # ILD-STABLE.   # follow up in 3 weeks/NO labs; MD.   No orders of the defined types were placed in this encounter.  All questions were answered. The patient knows to call the clinic with any problems, questions or concerns.      Cammie Sickle, MD 04/23/2018 6:45 PM

## 2018-04-23 NOTE — Assessment & Plan Note (Addendum)
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  Stable.  Patient finished 6 cycles a on July 31st 2019.    # Clinically STABLE; but see discussion below- re: nausea.  # nausea; intermittent headaches-new-clinically less likely concerning for lymphomatous involvement of the CNS.  However patient is high risk of involvement of the CNS-as it is triple hit lymphoma.  As the patient to take Zofran twice a day for the next 1 week; if not improved or worsened symptoms-recommend calling us we will get an MRI of the brain; also might need LP.   #Peripheral neuropathy/left foot drop-stable  # ILD-STABLE.   # follow up in 3 weeks/NO labs; MD.

## 2018-04-23 NOTE — Patient Instructions (Signed)
#  Take Zofran twice a day for 1 week; if the nausea does not improve/or gets worse-please call us for further recommendations/work-up

## 2018-05-01 ENCOUNTER — Other Ambulatory Visit: Payer: Self-pay | Admitting: *Deleted

## 2018-05-01 MED ORDER — MONTELUKAST SODIUM 10 MG PO TABS: ORAL_TABLET | ORAL | 11 refills | Status: DC

## 2018-05-06 ENCOUNTER — Telehealth: Payer: Self-pay | Admitting: *Deleted

## 2018-05-06 NOTE — Telephone Encounter (Signed)
Called to report that her nausea has improved over all. "Some days better than others." She is no longer taking nausea medications at this time.

## 2018-05-10 IMAGING — NM NM CARDIA MUGA REST
9 series · 41 of 41 positions shown · non-contrast
Comparison: None

CLINICAL DATA: Diffuse large B-cell lymphoma, monitoring of cardia
toxic drug therapy

EXAM:
NUCLEAR MEDICINE CARDIAC BLOOD POOL IMAGING (MUGA)
TECHNIQUE: Cardiac multi-gated acquisition was performed at rest following
intravenous injection of 4c-EEm labeled red blood cells.
RADIOPHARMACEUTICALS:  23.9 mCi 4c-EEm pertechnetate in-vitro
labeled autologous red blood cells IV

[Series 1000: 45 lao-gated (original with roi) · 3.30mm/px · 6 of 24 frames shown]
[frame 3/24]
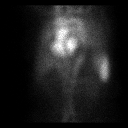
[frame 7/24]
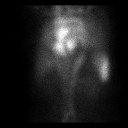
[frame 11/24]
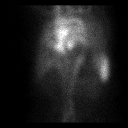
[frame 15/24]
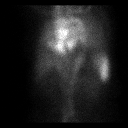
[frame 19/24]
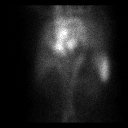
[frame 23/24]
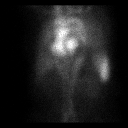

[Series 1000: 70 degree-gated · 3.30mm/px · 6 of 24 frames shown]
[frame 3/24]
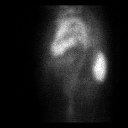
[frame 7/24]
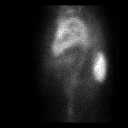
[frame 11/24]
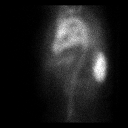
[frame 15/24]
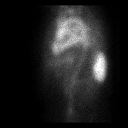
[frame 19/24]
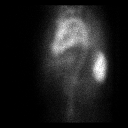
[frame 23/24]
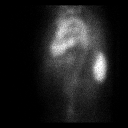

[Series 1000: anterior · 3.30mm/px · 1 of 1 slices shown]
[im 1/1]
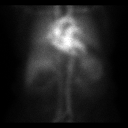

[Series 1000: 45 lao-gated (results) · 3.30mm/px · 6 of 24 frames shown]
[frame 3/24]
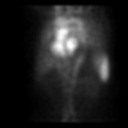
[frame 7/24]
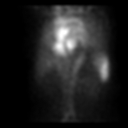
[frame 11/24]
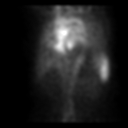
[frame 15/24]
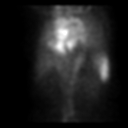
[frame 19/24]
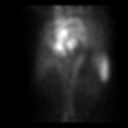
[frame 23/24]
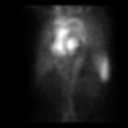

[Series 1000: 45 lao-gated · 3.30mm/px · 6 of 24 frames shown]
[frame 3/24]
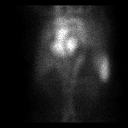
[frame 7/24]
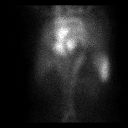
[frame 11/24]
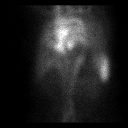
[frame 15/24]
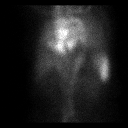
[frame 19/24]
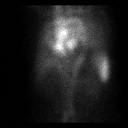
[frame 23/24]
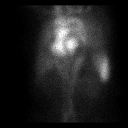

[Series 1000: 45 lao · 3.30mm/px · 1 of 1 slices shown]
[im 1/1]
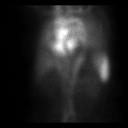

[Series 1000: 70 degree · 3.30mm/px · 1 of 1 slices shown]
[im 1/1]
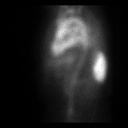

[Series 1000: 45 lao-gated (functional) · 3.30mm/px · 8 of 8 slices shown]
[im 1/8]
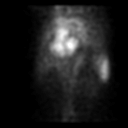
[im 2/8]
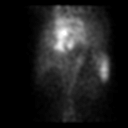
[im 3/8]
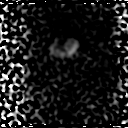
[im 4/8  full-range]
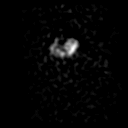
[im 5/8  full-range]
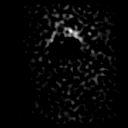
[im 6/8  full-range]
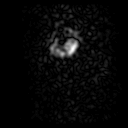
[im 7/8]
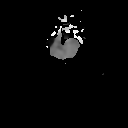
[im 8/8]
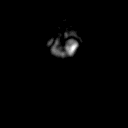

[Series 1000: anterior-gated · 3.30mm/px · 6 of 24 frames shown]
[frame 3/24]
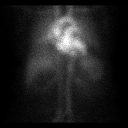
[frame 7/24]
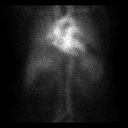
[frame 11/24]
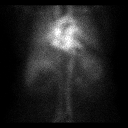
[frame 15/24]
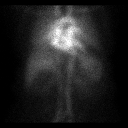
[frame 19/24]
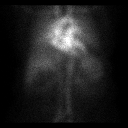
[frame 23/24]
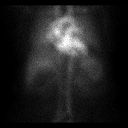

[41 of 41 positions shown; findings below may reference images not displayed]

FINDINGS: Calculated LEFT ventricular ejection fraction is 55%, normal.

Study was obtained at a cardiac rate of 98 beats per minute.

Patient was rhythmic during image acquisition.

Wall motion analysis of the LEFT ventricle in 3 projections
demonstrates normal LEFT ventricular wall motion.
IMPRESSION: Normal LEFT ventricular ejection fraction of 55%.

Normal LV wall motion.

## 2018-05-14 ENCOUNTER — Inpatient Hospital Stay: Payer: Medicare Other | Admitting: Internal Medicine

## 2018-05-14 ENCOUNTER — Other Ambulatory Visit: Payer: Self-pay

## 2018-05-14 ENCOUNTER — Inpatient Hospital Stay: Payer: Medicare Other

## 2018-05-14 VITALS — BP 117/71 | HR 80 | Temp 97.6°F | Resp 20

## 2018-05-14 DIAGNOSIS — J849 Interstitial pulmonary disease, unspecified: Secondary | ICD-10-CM

## 2018-05-14 DIAGNOSIS — R110 Nausea: Secondary | ICD-10-CM | POA: Diagnosis not present

## 2018-05-14 DIAGNOSIS — Z95828 Presence of other vascular implants and grafts: Secondary | ICD-10-CM

## 2018-05-14 DIAGNOSIS — R51 Headache: Secondary | ICD-10-CM

## 2018-05-14 DIAGNOSIS — M21372 Foot drop, left foot: Secondary | ICD-10-CM | POA: Diagnosis not present

## 2018-05-14 DIAGNOSIS — Z7984 Long term (current) use of oral hypoglycemic drugs: Secondary | ICD-10-CM

## 2018-05-14 DIAGNOSIS — Z23 Encounter for immunization: Secondary | ICD-10-CM

## 2018-05-14 DIAGNOSIS — C8333 Diffuse large B-cell lymphoma, intra-abdominal lymph nodes: Secondary | ICD-10-CM

## 2018-05-14 DIAGNOSIS — Z87891 Personal history of nicotine dependence: Secondary | ICD-10-CM

## 2018-05-14 DIAGNOSIS — E1140 Type 2 diabetes mellitus with diabetic neuropathy, unspecified: Secondary | ICD-10-CM

## 2018-05-14 MED ORDER — HEPARIN SOD (PORK) LOCK FLUSH 100 UNIT/ML IV SOLN
500.0000 [IU] | Freq: Once | INTRAVENOUS | Status: AC
  Administered 2018-05-14: 500 [IU]
  Filled 2018-05-14: qty 5

## 2018-05-14 MED ORDER — SODIUM CHLORIDE 0.9% FLUSH
10.0000 mL | Freq: Once | INTRAVENOUS | Status: AC
  Administered 2018-05-14: 10 mL via INTRAVENOUS
  Filled 2018-05-14: qty 10

## 2018-05-14 MED ORDER — INFLUENZA VAC SPLIT HIGH-DOSE 0.5 ML IM SUSY
0.5000 mL | PREFILLED_SYRINGE | Freq: Once | INTRAMUSCULAR | Status: AC
  Administered 2018-05-14: 0.5 mL via INTRAMUSCULAR
  Filled 2018-05-14: qty 0.5

## 2018-05-14 NOTE — Progress Notes (Signed)
[place] OFFICE PROGRESS NOTE  Patient Care Team: Rusty Aus, [name]  Cancer Staging No matching staging information was found for the patient.   Oncology History   DEC 2017- DLBCL "TRIPLE HIT [myc/ bcl-2/bcl-6 gene rearrangement FISH]" ~10 cm mass RP LN; STAGE II [jan 2018- BMBx-NEG]; Jan 8th R-[facility];   # JAN 29th 2018- DA-R EPOCH x5 ; PET CR; s/p RT [last 03/18/2017]  # FEB-MARCH 2019- RECURRENCE of DLBCL [s/p RP Bx; 4 cm mass inferior to Left Kidney] ; II OPINION [facility]; Dr.Grover.   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  female pleasant patient above history of recurrent diffuse large B-cell lymphoma is here for follow-up.  Patient continues to have intermittent nausea but overall improved since her last visit.  Denies any vomiting.  Left neck pain is improved.  Chronic mild tingling and numbness in extremities.  No falls.   Review of Systems  Constitutional: Negative for chills, diaphoresis, fever, malaise/fatigue and weight loss.  HENT: Negative for nosebleeds and sore throat.   Eyes: Negative for double vision.  Respiratory: Negative for cough, hemoptysis, sputum production, shortness of breath and wheezing.   Cardiovascular: Negative for chest pain, palpitations, orthopnea and leg swelling.  Gastrointestinal: Positive for nausea. Negative for abdominal pain, blood in stool, constipation, diarrhea, heartburn, melena and vomiting.  Genitourinary: Negative for dysuria, frequency and  urgency.  Musculoskeletal: Negative for back pain and joint pain.  Skin: Negative.  Negative for itching and rash.  Neurological: Negative for tingling, focal weakness and weakness.  Endo/Heme/Allergies: Does not bruise/bleed easily.  Psychiatric/Behavioral: Negative for depression. The patient is not nervous/anxious and does not have insomnia.       PAST MEDICAL HISTORY :  Past Medical History:  Diagnosis Date  . Diabetes mellitus without complication ([place])   . Heart murmur   . History of chemotherapy 07/08/2017  . History of gastric ulcer   . History of radiation therapy 07/08/2017  . Hypercholesterolemia   . Hypertension   . ILD (interstitial lung disease) ([place])    8 yrs ago  . Lymphadenopathy     PAST SURGICAL HISTORY :   Past Surgical History:  Procedure Laterality Date  . BREAST BIOPSY Left 2010   neg  . CATARACT EXTRACTION W/PHACO Left 10/16/2017   Procedure: CATARACT EXTRACTION PHACO AND INTRAOCULAR LENS PLACEMENT ([place]) COMPLICATED DIABETIC LEFT;  Surgeon: Leandrew Koyanagi, MD;  Location: [place];  Service: Ophthalmology;  Laterality: Left;  IRIS HOOKS Diabetic - oral meds  . HERNIA REPAIR    . PARTIAL HYSTERECTOMY     age 26  . PERIPHERAL VASCULAR CATHETERIZATION N/A 08/22/2016   Procedure: [name];  Surgeon: [name], MD;  Location: [facility];  Service: Cardiovascular;  Laterality: N/A;  . TONSILLECTOMY      FAMILY HISTORY :   Family History  Problem Relation Age of Onset  . Heart disease Mother   . Heart disease Father   . Heart disease Brother     SOCIAL HISTORY:   Social History   Tobacco  Use  . Smoking status: Former Smoker    Last attempt to quit: 08/21/1958    Years since quitting: 59.7  . Smokeless tobacco: Never Used  Substance Use Topics  . Alcohol use: No  . Drug use: No    ALLERGIES:  is allergic to levaquin [levofloxacin in d5w]; cefuroxime axetil; and doxycycline.  MEDICATIONS:  Current  Outpatient Medications  Medication Sig Dispense Refill  . b complex vitamins tablet Take 1 tablet by mouth daily.    Marland Kitchen gabapentin (NEURONTIN) 300 MG capsule Take 1 capsule (300 mg total) by mouth 2 (two) times daily. 180 capsule 4  . latanoprost (XALATAN) 0.005 % ophthalmic solution Place 1 drop into both eyes at bedtime.     . lidocaine-prilocaine (EMLA) cream Apply cream 1 hour before chemotherapy treatment, place small amount of saran wrap over cream to protect clothing. 30 g 1  . Magnesium 400 MG TABS Take 1 tablet by mouth daily.    . metFORMIN (GLUCOPHAGE) 500 MG tablet Take 250 mg by mouth daily with breakfast.     . metoprolol tartrate (LOPRESSOR) 25 MG tablet Take 1 tablet (25 mg total) by mouth 2 (two) times daily. 180 tablet 4  . montelukast (SINGULAIR) 10 MG tablet One a day. 30 tablet 11  . pantoprazole (PROTONIX) 40 MG tablet TAKE 1 TABLET BY MOUTH TWICE DAILY    . pravastatin (PRAVACHOL) 40 MG tablet Take 1 tablet by mouth daily.    . ondansetron (ZOFRAN) 8 MG tablet One pill every 8 hours as needed for nausea/vomitting. (Patient not taking: Reported on 05/14/2018) 40 tablet 1  . prochlorperazine (COMPAZINE) 10 MG tablet Take 1 tablet (10 mg total) by mouth every 6 (six) hours as needed for nausea, vomiting or refractory nausea / vomiting. (Patient not taking: Reported on 04/23/2018) 40 tablet 0   No current facility-administered medications for this visit.    Facility-Administered Medications Ordered in Other Visits  Medication Dose Route Frequency Provider Last Rate Last Dose  . 0.9 %  sodium chloride infusion   Intravenous Continuous Cammie Sickle, MD 10 mL/hr at 12/05/16 1030    . 0.9 %  sodium chloride infusion   Intravenous Continuous Cammie Sickle, MD   Stopped at 12/26/16 1011  . 0.9 %  sodium chloride infusion   Intravenous Continuous Charlaine Dalton R, MD 10 mL/hr at 12/27/16 0930    . heparin lock flush 100 unit/mL  500 Units Intravenous Once  Charlaine Dalton R, MD      . heparin lock flush 100 unit/mL  500 Units Intravenous Once Charlaine Dalton R, MD      . sodium chloride flush (NS) 0.9 % injection 10 mL  10 mL Intravenous PRN Cammie Sickle, MD   10 mL at 11/06/16 1000  . sodium chloride flush (NS) 0.9 % injection 10 mL  10 mL Intravenous PRN Cammie Sickle, MD   10 mL at 11/07/16 0905  . sodium chloride flush (NS) 0.9 % injection 10 mL  10 mL Intravenous PRN Charlaine Dalton R, MD      . sodium chloride flush (NS) 0.9 % injection 10 mL  10 mL Intravenous PRN Cammie Sickle, MD   10 mL at 12/05/16 1030  . sodium chloride flush (NS) 0.9 % injection 10 mL  10 mL Intravenous PRN Charlaine Dalton R, MD   10 mL at 12/27/16 0930    PHYSICAL EXAMINATION: ECOG PERFORMANCE STATUS: 1 - Symptomatic but completely ambulatory  BP 117/71  Pulse 80   Temp 97.6 F (36.4 C) (Tympanic)   Resp 20   There were no vitals filed for this visit.  Physical Exam  Constitutional: She is oriented to person, place, and time and well-developed, well-nourished, and in no distress.  Accompanied by her husband.  Walking vessel.  HENT:  Head: Normocephalic and atraumatic.  Mouth/Throat: Oropharynx is clear and moist. No oropharyngeal exudate.  Eyes: Pupils are equal, round, and reactive to light.  Neck: Normal range of motion. Neck supple.  Cardiovascular: Normal rate and regular rhythm.  Pulmonary/Chest: No respiratory distress. She has no wheezes.  Positive for crackles bilateral lung fields at the bases.  Abdominal: Soft. Bowel sounds are normal. She exhibits no distension and no mass. There is no tenderness. There is no rebound and no guarding.  Musculoskeletal: Normal range of motion. She exhibits no edema or tenderness.  Neurological: She is alert and oriented to person, place, and time.  Skin: Skin is warm.  Psychiatric: Affect normal.       LABORATORY DATA:  I have reviewed the data as listed     Component Value Date/Time   NA 140 04/23/2018 0946   K 3.9 04/23/2018 0946   K 4.1 07/01/2014 1429   CL 104 04/23/2018 0946   CO2 27 04/23/2018 0946   GLUCOSE 118 (H) 04/23/2018 0946   BUN 11 04/23/2018 0946   CREATININE 0.46 04/23/2018 0946   CALCIUM 9.3 04/23/2018 0946   PROT 6.9 04/23/2018 0946   ALBUMIN 4.1 04/23/2018 0946   AST 18 04/23/2018 0946   ALT 12 04/23/2018 0946   ALKPHOS 64 04/23/2018 0946   BILITOT 0.4 04/23/2018 0946   GFRNONAA >60 04/23/2018 0946   GFRAA >60 04/23/2018 0946    No results found for: SPEP, UPEP  Lab Results  Component Value Date   WBC 5.8 04/23/2018   NEUTROABS 3.5 04/23/2018   HGB 10.9 (L) 04/23/2018   HCT 32.8 (L) 04/23/2018   MCV 95.5 04/23/2018   PLT 221 04/23/2018      Chemistry      Component Value Date/Time   NA 140 04/23/2018 0946   K 3.9 04/23/2018 0946   K 4.1 07/01/2014 1429   CL 104 04/23/2018 0946   CO2 27 04/23/2018 0946   BUN 11 04/23/2018 0946   CREATININE 0.46 04/23/2018 0946      Component Value Date/Time   CALCIUM 9.3 04/23/2018 0946   ALKPHOS 64 04/23/2018 0946   AST 18 04/23/2018 0946   ALT 12 04/23/2018 0946   BILITOT 0.4 04/23/2018 0946       RADIOGRAPHIC STUDIES: I have personally reviewed the radiological images as listed and agreed with the findings in the report. No results found.   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  Stable.  Patient finished 6 cycles a on July 31st 2019.    # Clinically STABLE; but see discussion below- re: nausea.  # nausea; intermittent headaches-new-clinically less likely concerning for lymphomatous involvement of the CNS.  However patient is high risk of involvement of the CNS-as it is triple hit lymphoma.  As the patient to take Zofran twice a day for the next 1 week;  if not improved or worsened symptoms-recommend calling us we will get an MRI of the brain; also might need LP.   #Peripheral  neuropathy/left foot drop-stable  # ILD-STABLE.   #We will provide patient with letter to be excused from jury duty given her medical problems.  # follow up in 2 months/labs/PET scan prior.   Orders Placed This Encounter  Procedures  . NM PET Image Restag (PS) Skull Base To Thigh    Standing Status:   Future    Standing Expiration Date:   07/14/2018    Order Specific Question:   ** REASON FOR EXAM (FREE TEXT)    Answer:   diffuse large b cell lymphoma    Order Specific Question:   If indicated for the ordered procedure, I authorize the administration of a radiopharmaceutical per Radiology protocol    Answer:   Yes    Order Specific Question:   Preferred imaging location?    Answer:   [facility]    Order Specific Question:   Radiology Contrast Protocol - do NOT remove file path    Answer:   [email or web address]   All questions were answered. The patient knows to call the clinic with any problems, questions or concerns.      Cammie Sickle, MD 05/14/2018 5:08 PM

## 2018-05-14 NOTE — Assessment & Plan Note (Addendum)
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  Stable.  Patient finished 6 cycles a on July 31st 2019.    # Clinically STABLE; but see discussion below- re: nausea.  # nausea; intermittent headaches-new-clinically less likely concerning for lymphomatous involvement of the CNS.  However patient is high risk of involvement of the CNS-as it is triple hit lymphoma.  As the patient to take Zofran twice a day for the next 1 week; if not improved or worsened symptoms-recommend calling us we will get an MRI of the brain; also might need LP.   #Peripheral neuropathy/left foot drop-stable  # ILD-STABLE.   #We will provide patient with letter to be excused from jury duty given her medical problems.  # follow up in 2 months/labs/PET scan prior.

## 2018-06-30 ENCOUNTER — Other Ambulatory Visit: Payer: Self-pay | Admitting: Internal Medicine

## 2018-06-30 DIAGNOSIS — T451X5A Adverse effect of antineoplastic and immunosuppressive drugs, initial encounter: Secondary | ICD-10-CM

## 2018-06-30 DIAGNOSIS — M21372 Foot drop, left foot: Secondary | ICD-10-CM

## 2018-06-30 DIAGNOSIS — G620 Drug-induced polyneuropathy: Secondary | ICD-10-CM

## 2018-06-30 DIAGNOSIS — C8333 Diffuse large B-cell lymphoma, intra-abdominal lymph nodes: Secondary | ICD-10-CM

## 2018-07-02 ENCOUNTER — Other Ambulatory Visit: Payer: Self-pay | Admitting: Internal Medicine

## 2018-07-02 DIAGNOSIS — C8333 Diffuse large B-cell lymphoma, intra-abdominal lymph nodes: Secondary | ICD-10-CM

## 2018-07-07 ENCOUNTER — Other Ambulatory Visit: Payer: Self-pay | Admitting: Internal Medicine

## 2018-07-07 ENCOUNTER — Ambulatory Visit: Payer: Medicare Other

## 2018-07-07 DIAGNOSIS — C8333 Diffuse large B-cell lymphoma, intra-abdominal lymph nodes: Secondary | ICD-10-CM

## 2018-07-07 NOTE — Progress Notes (Signed)
Please schedule the CT scan a/p instead of PET scan; follow 1-2 days after the scan. Thx GB

## 2018-07-09 ENCOUNTER — Inpatient Hospital Stay: Payer: Medicare Other | Admitting: Internal Medicine

## 2018-07-09 ENCOUNTER — Inpatient Hospital Stay: Payer: Medicare Other

## 2018-07-15 ENCOUNTER — Ambulatory Visit
Admission: RE | Admit: 2018-07-15 | Discharge: 2018-07-15 | Disposition: A | Discharge status: Home / Self Care | Payer: Medicare Other | Source: Ambulatory Visit | Attending: Internal Medicine | Admitting: Internal Medicine

## 2018-07-15 ENCOUNTER — Inpatient Hospital Stay: Payer: Medicare Other | Attending: Internal Medicine

## 2018-07-15 DIAGNOSIS — E7800 Pure hypercholesterolemia, unspecified: Secondary | ICD-10-CM | POA: Insufficient documentation

## 2018-07-15 DIAGNOSIS — M21372 Foot drop, left foot: Secondary | ICD-10-CM | POA: Diagnosis not present

## 2018-07-15 DIAGNOSIS — R51 Headache: Secondary | ICD-10-CM | POA: Diagnosis not present

## 2018-07-15 DIAGNOSIS — E119 Type 2 diabetes mellitus without complications: Secondary | ICD-10-CM | POA: Diagnosis not present

## 2018-07-15 DIAGNOSIS — R110 Nausea: Secondary | ICD-10-CM | POA: Insufficient documentation

## 2018-07-15 DIAGNOSIS — G629 Polyneuropathy, unspecified: Secondary | ICD-10-CM | POA: Diagnosis not present

## 2018-07-15 DIAGNOSIS — C8333 Diffuse large B-cell lymphoma, intra-abdominal lymph nodes: Secondary | ICD-10-CM | POA: Diagnosis present

## 2018-07-15 DIAGNOSIS — Z79899 Other long term (current) drug therapy: Secondary | ICD-10-CM | POA: Diagnosis not present

## 2018-07-15 DIAGNOSIS — Z87891 Personal history of nicotine dependence: Secondary | ICD-10-CM | POA: Insufficient documentation

## 2018-07-15 DIAGNOSIS — J849 Interstitial pulmonary disease, unspecified: Secondary | ICD-10-CM | POA: Diagnosis not present

## 2018-07-15 DIAGNOSIS — I10 Essential (primary) hypertension: Secondary | ICD-10-CM | POA: Diagnosis not present

## 2018-07-15 LAB — COMPREHENSIVE METABOLIC PANEL
ALK PHOS: 48 U/L (ref 38–126)
ALT: 12 U/L (ref 0–44)
ANION GAP: 5 (ref 5–15)
AST: 18 U/L (ref 15–41)
Albumin: 4 g/dL (ref 3.5–5.0)
BUN: 17 mg/dL (ref 8–23)
CALCIUM: 9 mg/dL (ref 8.9–10.3)
CHLORIDE: 106 mmol/L (ref 98–111)
CO2: 26 mmol/L (ref 22–32)
Creatinine, Ser: 0.48 mg/dL (ref 0.44–1.00)
GFR calc Af Amer: >60 mL/min (ref >60)
GFR calc non Af Amer: >60 mL/min (ref >60)
GLUCOSE: 105 mg/dL — AB (ref 70–99)
Potassium: 3.9 mmol/L (ref 3.5–5.1)
SODIUM: 137 mmol/L (ref 135–145)
Total Bilirubin: 0.4 mg/dL (ref 0.3–1.2)
Total Protein: 6.3 g/dL — ABNORMAL LOW (ref 6.5–8.1)

## 2018-07-15 LAB — CBC WITH DIFFERENTIAL/PLATELET
Abs Immature Granulocytes: 0.01 10*3/uL (ref 0.00–0.07)
BASOS ABS: 0 10*3/uL (ref 0.0–0.1)
Basophils Relative: 1 %
EOS ABS: 0.3 10*3/uL (ref 0.0–0.5)
Eosinophils Relative: 6 %
HEMATOCRIT: 34.2 % — AB (ref 36.0–46.0)
HEMOGLOBIN: 10.6 g/dL — AB (ref 12.0–15.0)
Immature Granulocytes: 0 %
LYMPHS ABS: 1 10*3/uL (ref 0.7–4.0)
LYMPHS PCT: 20 %
MCH: 26.6 pg (ref 26.0–34.0)
MCHC: 31 g/dL (ref 30.0–36.0)
MCV: 85.7 fL (ref 80.0–100.0)
MONOS PCT: 13 %
Monocytes Absolute: 0.7 10*3/uL (ref 0.1–1.0)
NEUTROS ABS: 3.2 10*3/uL (ref 1.7–7.7)
NEUTROS PCT: 60 %
Platelets: 208 10*3/uL (ref 150–400)
RBC: 3.99 MIL/uL (ref 3.87–5.11)
RDW: 15 % (ref 11.5–15.5)
WBC: 5.2 10*3/uL (ref 4.0–10.5)
nRBC: 0 % (ref 0.0–0.2)

## 2018-07-15 LAB — POCT I-STAT CREATININE: Creatinine, Ser: 0.5 mg/dL (ref 0.44–1.00)

## 2018-07-15 MED ORDER — SODIUM CHLORIDE 0.9% FLUSH
10.0000 mL | Freq: Once | INTRAVENOUS | Status: AC
  Administered 2018-07-15: 10 mL via INTRAVENOUS
  Filled 2018-07-15: qty 10

## 2018-07-15 MED ORDER — IOPAMIDOL (ISOVUE-300) INJECTION 61%
100.0000 mL | Freq: Once | INTRAVENOUS | Status: AC | PRN
  Administered 2018-07-15: 100 mL via INTRAVENOUS

## 2018-07-15 MED ORDER — HEPARIN SOD (PORK) LOCK FLUSH 100 UNIT/ML IV SOLN
500.0000 [IU] | Freq: Once | INTRAVENOUS | Status: AC
  Administered 2018-07-15: 500 [IU] via INTRAVENOUS
  Filled 2018-07-15: qty 5

## 2018-07-16 ENCOUNTER — Inpatient Hospital Stay (HOSPITAL_BASED_OUTPATIENT_CLINIC_OR_DEPARTMENT_OTHER): Payer: Medicare Other | Admitting: Internal Medicine

## 2018-07-16 VITALS — BP 126/69 | HR 69 | Resp 16 | Wt 154.4 lb

## 2018-07-16 DIAGNOSIS — J849 Interstitial pulmonary disease, unspecified: Secondary | ICD-10-CM

## 2018-07-16 DIAGNOSIS — R51 Headache: Secondary | ICD-10-CM

## 2018-07-16 DIAGNOSIS — G629 Polyneuropathy, unspecified: Secondary | ICD-10-CM

## 2018-07-16 DIAGNOSIS — I10 Essential (primary) hypertension: Secondary | ICD-10-CM | POA: Diagnosis not present

## 2018-07-16 DIAGNOSIS — R110 Nausea: Secondary | ICD-10-CM

## 2018-07-16 DIAGNOSIS — C8333 Diffuse large B-cell lymphoma, intra-abdominal lymph nodes: Secondary | ICD-10-CM

## 2018-07-16 DIAGNOSIS — Z87891 Personal history of nicotine dependence: Secondary | ICD-10-CM

## 2018-07-16 DIAGNOSIS — Z79899 Other long term (current) drug therapy: Secondary | ICD-10-CM

## 2018-07-16 DIAGNOSIS — E7800 Pure hypercholesterolemia, unspecified: Secondary | ICD-10-CM

## 2018-07-16 DIAGNOSIS — M21372 Foot drop, left foot: Secondary | ICD-10-CM

## 2018-07-16 DIAGNOSIS — E119 Type 2 diabetes mellitus without complications: Secondary | ICD-10-CM

## 2018-07-16 NOTE — Assessment & Plan Note (Addendum)
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  NOV 2019- CT scan- partial response/ continued regression.   # Clinically STABLE; no concerns for any recurrence.  Hold off any PET scan at this time.  Recommend surveillance CT scans given patient's high risk of recurrence.  #Chronic mild nausea/intermittent headaches.  Stable.  Not worse.  #Peripheral neuropathy/left foot drop-stable.   # ILD-stable.   # DISPOSITION:  # follow up in 2 months/ labs-cbc/cmp/ldh/port flush- Dr.B  # 25 minutes face-to-face with the patient discussing the above plan of care; more than 50% of time spent on prognosis/ natural history; counseling and coordination.

## 2018-07-16 NOTE — Progress Notes (Signed)
[name] OFFICE PROGRESS NOTE  Patient Care Team: Rusty Aus, [name]  Cancer Staging No matching staging information was found for the patient.   Oncology History   DEC 2017- DLBCL "TRIPLE HIT [myc/ bcl-2/bcl-6 gene rearrangement FISH]" ~10 cm mass RP LN; STAGE II [jan 2018- BMBx-NEG]; Jan 8th R-[facility];   # JAN 29th 2018- DA-R EPOCH x5 ; PET CR; s/p RT [last 03/18/2017]  # FEB-MARCH 2019- RECURRENCE of DLBCL [s/p RP Bx; 4 cm mass inferior to Left Kidney] ; II [facility]; Dr.Grover.   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  female pleasant patient above history of recurrent diffuse large B-cell lymphoma is here for follow-up/review the results of the CAT scan.  Patient denies any worsening headaches.  Continues to have intermittent mild headaches with nausea.  No vomiting.  Appetite is good.  No weight loss.  Chronic mild tingling in the extremities.  No fall.  Review of Systems  Constitutional: Negative for chills, diaphoresis, fever, malaise/fatigue and weight loss.  HENT: Negative for nosebleeds and sore throat.   Eyes: Negative for double vision.  Respiratory: Negative for cough, hemoptysis, sputum production, shortness of breath and wheezing.   Cardiovascular: Negative for chest pain, palpitations, orthopnea and leg swelling.  Gastrointestinal: Positive for nausea. Negative for abdominal pain, blood in stool, constipation, diarrhea, heartburn, melena and vomiting.  Genitourinary:  Negative for dysuria, frequency and urgency.  Musculoskeletal: Negative for back pain and joint pain.  Skin: Negative.  Negative for itching and rash.  Neurological: Positive for tingling and headaches. Negative for focal weakness and weakness.  Endo/Heme/Allergies: Does not bruise/bleed easily.  Psychiatric/Behavioral: Negative for depression. The patient is not nervous/anxious and does not have insomnia.       PAST MEDICAL HISTORY :  Past Medical History:  Diagnosis Date  . Cancer ([place])   . Diabetes mellitus without complication ([place])   . Heart murmur   . History of chemotherapy 07/08/2017  . History of gastric ulcer   . History of radiation therapy 07/08/2017  . Hypercholesterolemia   . Hypertension   . ILD (interstitial lung disease) ([place])    8 yrs ago  . Lymphadenopathy     PAST SURGICAL HISTORY :   Past Surgical History:  Procedure Laterality Date  . BREAST BIOPSY Left 2010   neg  . CATARACT EXTRACTION W/PHACO Left 10/16/2017   Procedure: CATARACT EXTRACTION PHACO AND INTRAOCULAR LENS PLACEMENT ([place]) COMPLICATED DIABETIC LEFT;  Surgeon: Leandrew Koyanagi, MD;  Location: [facility];  Service: Ophthalmology;  Laterality: Left;  IRIS HOOKS Diabetic - oral meds  . HERNIA REPAIR    . PARTIAL HYSTERECTOMY     age 72  . PERIPHERAL VASCULAR CATHETERIZATION N/A 08/22/2016   Procedure: [name];  Surgeon: [name], MD;  Location: [place] CV LAB;  Service: Cardiovascular;  Laterality: N/A;  . TONSILLECTOMY      FAMILY HISTORY :   Family History  Problem Relation Age of Onset  . Heart disease Mother   . Heart disease Father   . Heart  disease Brother     SOCIAL HISTORY:   Social History   Tobacco Use  . Smoking status: Former Smoker    Last attempt to quit: 08/21/1958    Years since quitting: 59.9  . Smokeless tobacco: Never Used  Substance Use Topics  . Alcohol use: No  . Drug use: No    ALLERGIES:  is allergic to levaquin  [levofloxacin in d5w]; cefuroxime axetil; and doxycycline.  MEDICATIONS:  Current Outpatient Medications  Medication Sig Dispense Refill  . b complex vitamins tablet Take 1 tablet by mouth daily.    Marland Kitchen gabapentin (NEURONTIN) 300 MG capsule TAKE 1 CAPSULE BY MOUTH TWICE DAILY 180 capsule 4  . latanoprost (XALATAN) 0.005 % ophthalmic solution Place 1 drop into both eyes at bedtime.     . lidocaine-prilocaine (EMLA) cream Apply cream 1 hour before chemotherapy treatment, place small amount of saran wrap over cream to protect clothing. 30 g 1  . Magnesium 400 MG TABS Take 1 tablet by mouth daily.    . metFORMIN (GLUCOPHAGE) 500 MG tablet Take 250 mg by mouth daily with breakfast.     . metoprolol tartrate (LOPRESSOR) 25 MG tablet Take 1 tablet (25 mg total) by mouth 2 (two) times daily. 180 tablet 4  . montelukast (SINGULAIR) 10 MG tablet One a day. 30 tablet 11  . ondansetron (ZOFRAN) 8 MG tablet One pill every 8 hours as needed for nausea/vomitting. 40 tablet 1  . pantoprazole (PROTONIX) 40 MG tablet TAKE 1 TABLET BY MOUTH TWICE DAILY    . pravastatin (PRAVACHOL) 40 MG tablet Take 1 tablet by mouth daily.    . prochlorperazine (COMPAZINE) 10 MG tablet Take 1 tablet (10 mg total) by mouth every 6 (six) hours as needed for nausea, vomiting or refractory nausea / vomiting. 40 tablet 0   No current facility-administered medications for this visit.    Facility-Administered Medications Ordered in Other Visits  Medication Dose Route Frequency Provider Last Rate Last Dose  . 0.9 %  sodium chloride infusion   Intravenous Continuous Cammie Sickle, MD 10 mL/hr at 12/05/16 1030    . 0.9 %  sodium chloride infusion   Intravenous Continuous Cammie Sickle, MD   Stopped at 12/26/16 1011  . 0.9 %  sodium chloride infusion   Intravenous Continuous Charlaine Dalton R, MD 10 mL/hr at 12/27/16 0930    . heparin lock flush 100 unit/mL  500 Units Intravenous Once Charlaine Dalton R, MD      .  heparin lock flush 100 unit/mL  500 Units Intravenous Once Charlaine Dalton R, MD      . sodium chloride flush (NS) 0.9 % injection 10 mL  10 mL Intravenous PRN Cammie Sickle, MD   10 mL at 11/06/16 1000  . sodium chloride flush (NS) 0.9 % injection 10 mL  10 mL Intravenous PRN Cammie Sickle, MD   10 mL at 11/07/16 0905  . sodium chloride flush (NS) 0.9 % injection 10 mL  10 mL Intravenous PRN Charlaine Dalton R, MD      . sodium chloride flush (NS) 0.9 % injection 10 mL  10 mL Intravenous PRN Cammie Sickle, MD   10 mL at 12/05/16 1030  . sodium chloride flush (NS) 0.9 % injection 10 mL  10 mL Intravenous PRN Charlaine Dalton R, MD   10 mL at 12/27/16 0930    PHYSICAL EXAMINATION: ECOG PERFORMANCE STATUS: 1 - Symptomatic but completely ambulatory  BP 126/69 (BP Location: Left  Arm, Patient Position: Sitting)   Pulse 69   Resp 16   Wt 154 lb 6.4 oz (70 kg)   BMI 24.18 kg/m   Filed Weights   07/16/18 1039  Weight: 154 lb 6.4 oz (70 kg)    Physical Exam  Constitutional: She is oriented to person, place, and time and well-developed, well-nourished, and in no distress.  Accompanied by her husband.  Walking vessel.  HENT:  Head: Normocephalic and atraumatic.  Mouth/Throat: Oropharynx is clear and moist. No oropharyngeal exudate.  Eyes: Pupils are equal, round, and reactive to light.  Neck: Normal range of motion. Neck supple.  Cardiovascular: Normal rate and regular rhythm.  Pulmonary/Chest: No respiratory distress. She has no wheezes.  Positive for crackles bilateral lung fields at the bases.  Abdominal: Soft. Bowel sounds are normal. She exhibits no distension and no mass. There is no tenderness. There is no rebound and no guarding.  Musculoskeletal: Normal range of motion. She exhibits no edema or tenderness.  Neurological: She is alert and oriented to person, place, and time.  Skin: Skin is warm.  Psychiatric: Affect normal.       LABORATORY  DATA:  I have reviewed the data as listed    Component Value Date/Time   NA 137 07/15/2018 1136   K 3.9 07/15/2018 1136   K 4.1 07/01/2014 1429   CL 106 07/15/2018 1136   CO2 26 07/15/2018 1136   GLUCOSE 105 (H) 07/15/2018 1136   BUN 17 07/15/2018 1136   CREATININE 0.50 07/15/2018 1345   CALCIUM 9.0 07/15/2018 1136   PROT 6.3 (L) 07/15/2018 1136   ALBUMIN 4.0 07/15/2018 1136   AST 18 07/15/2018 1136   ALT 12 07/15/2018 1136   ALKPHOS 48 07/15/2018 1136   BILITOT 0.4 07/15/2018 1136   GFRNONAA >60 07/15/2018 1136   GFRAA >60 07/15/2018 1136    No results found for: SPEP, UPEP  Lab Results  Component Value Date   WBC 5.2 07/15/2018   NEUTROABS 3.2 07/15/2018   HGB 10.6 (L) 07/15/2018   HCT 34.2 (L) 07/15/2018   MCV 85.7 07/15/2018   PLT 208 07/15/2018      Chemistry      Component Value Date/Time   NA 137 07/15/2018 1136   K 3.9 07/15/2018 1136   K 4.1 07/01/2014 1429   CL 106 07/15/2018 1136   CO2 26 07/15/2018 1136   BUN 17 07/15/2018 1136   CREATININE 0.50 07/15/2018 1345      Component Value Date/Time   CALCIUM 9.0 07/15/2018 1136   ALKPHOS 48 07/15/2018 1136   AST 18 07/15/2018 1136   ALT 12 07/15/2018 1136   BILITOT 0.4 07/15/2018 1136       RADIOGRAPHIC STUDIES: I have personally reviewed the radiological images as listed and agreed with the findings in the report. No results found.   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  NOV 2019- CT scan- partial response/ continued regression.   # Clinically STABLE; no concerns for any recurrence.  Hold off any PET scan at this time.  Recommend surveillance CT scans given patient's high risk of recurrence.  #Chronic mild nausea/intermittent headaches.  Stable.  Not worse.  #Peripheral neuropathy/left  foot drop-stable.   # ILD-stable.   # DISPOSITION:  # follow up in 2 months/  labs-cbc/cmp/ldh/port flush- Dr.B  # 25 minutes face-to-face with the patient discussing the above plan of care; more than 50% of time spent on prognosis/ natural history; counseling and coordination.    No orders of the defined types were placed in this encounter.  All questions were answered. The patient knows to call the clinic with any problems, questions or concerns.      Cammie Sickle, MD 07/22/2018 8:04 PM

## 2018-08-25 ENCOUNTER — Other Ambulatory Visit: Payer: Self-pay | Admitting: Internal Medicine

## 2018-08-25 DIAGNOSIS — M21372 Foot drop, left foot: Secondary | ICD-10-CM

## 2018-08-25 DIAGNOSIS — C8333 Diffuse large B-cell lymphoma, intra-abdominal lymph nodes: Secondary | ICD-10-CM

## 2018-08-25 DIAGNOSIS — G620 Drug-induced polyneuropathy: Secondary | ICD-10-CM

## 2018-08-25 DIAGNOSIS — T451X5A Adverse effect of antineoplastic and immunosuppressive drugs, initial encounter: Secondary | ICD-10-CM

## 2018-09-12 ENCOUNTER — Other Ambulatory Visit: Payer: Self-pay

## 2018-09-12 DIAGNOSIS — C8333 Diffuse large B-cell lymphoma, intra-abdominal lymph nodes: Secondary | ICD-10-CM

## 2018-09-17 ENCOUNTER — Inpatient Hospital Stay: Payer: Medicare Other | Attending: Internal Medicine

## 2018-09-17 ENCOUNTER — Inpatient Hospital Stay: Payer: Medicare Other | Admitting: Internal Medicine

## 2018-09-17 ENCOUNTER — Encounter: Payer: Self-pay | Admitting: Internal Medicine

## 2018-09-17 VITALS — BP 120/81 | HR 69 | Temp 98.0°F | Resp 16 | Wt 157.0 lb

## 2018-09-17 DIAGNOSIS — Z79899 Other long term (current) drug therapy: Secondary | ICD-10-CM | POA: Insufficient documentation

## 2018-09-17 DIAGNOSIS — C8333 Diffuse large B-cell lymphoma, intra-abdominal lymph nodes: Secondary | ICD-10-CM | POA: Insufficient documentation

## 2018-09-17 DIAGNOSIS — Z7984 Long term (current) use of oral hypoglycemic drugs: Secondary | ICD-10-CM | POA: Insufficient documentation

## 2018-09-17 DIAGNOSIS — E1140 Type 2 diabetes mellitus with diabetic neuropathy, unspecified: Secondary | ICD-10-CM

## 2018-09-17 DIAGNOSIS — R5381 Other malaise: Secondary | ICD-10-CM

## 2018-09-17 DIAGNOSIS — R51 Headache: Secondary | ICD-10-CM

## 2018-09-17 DIAGNOSIS — Z87891 Personal history of nicotine dependence: Secondary | ICD-10-CM

## 2018-09-17 DIAGNOSIS — R5383 Other fatigue: Secondary | ICD-10-CM | POA: Insufficient documentation

## 2018-09-17 DIAGNOSIS — R110 Nausea: Secondary | ICD-10-CM | POA: Diagnosis not present

## 2018-09-17 DIAGNOSIS — E7800 Pure hypercholesterolemia, unspecified: Secondary | ICD-10-CM

## 2018-09-17 DIAGNOSIS — I10 Essential (primary) hypertension: Secondary | ICD-10-CM | POA: Diagnosis not present

## 2018-09-17 DIAGNOSIS — Z95828 Presence of other vascular implants and grafts: Secondary | ICD-10-CM

## 2018-09-17 LAB — CBC WITH DIFFERENTIAL/PLATELET
Abs Immature Granulocytes: 0.01 10*3/uL (ref 0.00–0.07)
Basophils Absolute: 0 10*3/uL (ref 0.0–0.1)
Basophils Relative: 1 %
Eosinophils Absolute: 0.3 10*3/uL (ref 0.0–0.5)
Eosinophils Relative: 7 %
HEMATOCRIT: 35.8 % — AB (ref 36.0–46.0)
HEMOGLOBIN: 11.1 g/dL — AB (ref 12.0–15.0)
Immature Granulocytes: 0 %
LYMPHS PCT: 21 %
Lymphs Abs: 1 10*3/uL (ref 0.7–4.0)
MCH: 26.6 pg (ref 26.0–34.0)
MCHC: 31 g/dL (ref 30.0–36.0)
MCV: 85.6 fL (ref 80.0–100.0)
Monocytes Absolute: 0.7 10*3/uL (ref 0.1–1.0)
Monocytes Relative: 15 %
Neutro Abs: 2.7 10*3/uL (ref 1.7–7.7)
Neutrophils Relative %: 56 %
Platelets: 195 10*3/uL (ref 150–400)
RBC: 4.18 MIL/uL (ref 3.87–5.11)
RDW: 15 % (ref 11.5–15.5)
WBC: 4.8 10*3/uL (ref 4.0–10.5)
nRBC: 0 % (ref 0.0–0.2)

## 2018-09-17 LAB — COMPREHENSIVE METABOLIC PANEL
ALK PHOS: 55 U/L (ref 38–126)
ALT: 13 U/L (ref 0–44)
ANION GAP: 9 (ref 5–15)
AST: 18 U/L (ref 15–41)
Albumin: 4 g/dL (ref 3.5–5.0)
BUN: 16 mg/dL (ref 8–23)
CO2: 25 mmol/L (ref 22–32)
Calcium: 8.9 mg/dL (ref 8.9–10.3)
Chloride: 105 mmol/L (ref 98–111)
Creatinine, Ser: 0.55 mg/dL (ref 0.44–1.00)
GFR calc Af Amer: >60 mL/min (ref >60)
GFR calc non Af Amer: >60 mL/min (ref >60)
GLUCOSE: 167 mg/dL — AB (ref 70–99)
Potassium: 3.8 mmol/L (ref 3.5–5.1)
Sodium: 139 mmol/L (ref 135–145)
Total Bilirubin: 0.4 mg/dL (ref 0.3–1.2)
Total Protein: 6.6 g/dL (ref 6.5–8.1)

## 2018-09-17 MED ORDER — HEPARIN SOD (PORK) LOCK FLUSH 100 UNIT/ML IV SOLN
INTRAVENOUS | Status: AC
  Filled 2018-09-17: qty 5

## 2018-09-17 MED ORDER — HEPARIN SOD (PORK) LOCK FLUSH 100 UNIT/ML IV SOLN
500.0000 [IU] | Freq: Once | INTRAVENOUS | Status: AC
  Administered 2018-09-17: 500 [IU] via INTRAVENOUS

## 2018-09-17 MED ORDER — SODIUM CHLORIDE 0.9% FLUSH
10.0000 mL | Freq: Once | INTRAVENOUS | Status: AC
  Administered 2018-09-17: 10 mL via INTRAVENOUS
  Filled 2018-09-17: qty 10

## 2018-09-17 NOTE — Progress Notes (Signed)
[place] OFFICE PROGRESS NOTE  Patient Care Team: Rusty Aus, [name]  Cancer Staging No matching staging information was found for the patient.   Oncology History   DEC 2017- DLBCL "TRIPLE HIT [myc/ bcl-2/bcl-6 gene rearrangement FISH]" ~10 cm mass RP LN; STAGE II [jan 2018- BMBx-NEG]; Jan 8th [facility];   # JAN 29th 2018- DA-R EPOCH x5 ; PET CR; s/p RT [last 03/18/2017]  # FEB-MARCH 2019- RECURRENCE of DLBCL [s/p RP Bx; 4 cm mass inferior to Left Kidney] ; II [facility]; Dr.Grover.   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  female pleasant patient above history of recurrent diffuse large B-cell lymphoma is here for follow-up.  Patient has not had any hospitalizations.  Denies any unusual shortness of breath or cough.  Denies abdominal pain.   Chronic mild nausea for which he takes Zofran.  Chronic mild tingling and numbness in the left lower leg.  Not any worse.  Review of Systems  Constitutional: Positive for malaise/fatigue. Negative for chills, diaphoresis, fever and weight loss.  HENT: Negative for nosebleeds and sore throat.   Eyes: Negative for double vision.  Respiratory: Negative for cough, hemoptysis, sputum production, shortness of breath and wheezing.   Cardiovascular: Negative for chest pain, palpitations, orthopnea and leg swelling.  Gastrointestinal: Positive for nausea. Negative for abdominal pain, blood in stool, constipation, diarrhea, heartburn, melena and vomiting.   Genitourinary: Negative for dysuria, frequency and urgency.  Musculoskeletal: Negative for back pain and joint pain.  Skin: Negative.  Negative for itching and rash.  Neurological: Positive for tingling. Negative for focal weakness and weakness.  Endo/Heme/Allergies: Does not bruise/bleed easily.  Psychiatric/Behavioral: Negative for depression. The patient is not nervous/anxious and does not have insomnia.       PAST MEDICAL HISTORY :  Past Medical History:  Diagnosis Date  . Cancer ([place])   . Diabetes mellitus without complication ([place])   . Heart murmur   . History of chemotherapy 07/08/2017  . History of gastric ulcer   . History of radiation therapy 07/08/2017  . Hypercholesterolemia   . Hypertension   . ILD (interstitial lung disease) ([place])    8 yrs ago  . Lymphadenopathy     PAST SURGICAL HISTORY :   Past Surgical History:  Procedure Laterality Date  . BREAST BIOPSY Left 2010   neg  . CATARACT EXTRACTION W/PHACO Left 10/16/2017   Procedure: CATARACT EXTRACTION PHACO AND INTRAOCULAR LENS PLACEMENT ([place]) COMPLICATED DIABETIC LEFT;  Surgeon: Leandrew Koyanagi, MD;  Location: [facility];  Service: Ophthalmology;  Laterality: Left;  IRIS HOOKS Diabetic - oral meds  . HERNIA REPAIR    . PARTIAL HYSTERECTOMY     age 73  . PERIPHERAL VASCULAR CATHETERIZATION N/A 08/22/2016   Procedure: [name];  Surgeon: [name], MD;  Location: [place] [facility];  Service: Cardiovascular;  Laterality: N/A;  . TONSILLECTOMY      FAMILY HISTORY :   Family History  Problem Relation Age of Onset  . Heart disease Mother   . Heart disease Father   .  Heart disease Brother     SOCIAL HISTORY:   Social History   Tobacco Use  . Smoking status: Former Smoker    Last attempt to quit: 08/21/1958    Years since quitting: 60.1  . Smokeless tobacco: Never Used  Substance Use Topics  . Alcohol use: No  . Drug use: No    ALLERGIES:  is allergic to levaquin  [levofloxacin in d5w]; cefuroxime axetil; and doxycycline.  MEDICATIONS:  Current Outpatient Medications  Medication Sig Dispense Refill  . b complex vitamins tablet Take 1 tablet by mouth daily.    Marland Kitchen gabapentin (NEURONTIN) 300 MG capsule TAKE 1 CAPSULE BY MOUTH TWICE DAILY 180 capsule 4  . latanoprost (XALATAN) 0.005 % ophthalmic solution Place 1 drop into both eyes at bedtime.     . lidocaine-prilocaine (EMLA) cream Apply cream 1 hour before chemotherapy treatment, place small amount of saran wrap over cream to protect clothing. 30 g 1  . Magnesium 400 MG TABS Take 1 tablet by mouth daily.    . metFORMIN (GLUCOPHAGE) 500 MG tablet Take 250 mg by mouth daily with breakfast.     . metoprolol tartrate (LOPRESSOR) 25 MG tablet TAKE 1 TABLET BY MOUTH TWICE DAILY 180 tablet 0  . montelukast (SINGULAIR) 10 MG tablet One a day. 30 tablet 11  . ondansetron (ZOFRAN) 8 MG tablet One pill every 8 hours as needed for nausea/vomitting. 40 tablet 1  . pantoprazole (PROTONIX) 40 MG tablet TAKE 1 TABLET BY MOUTH TWICE DAILY    . pravastatin (PRAVACHOL) 40 MG tablet Take 1 tablet by mouth daily.    . prochlorperazine (COMPAZINE) 10 MG tablet Take 1 tablet (10 mg total) by mouth every 6 (six) hours as needed for nausea, vomiting or refractory nausea / vomiting. 40 tablet 0   No current facility-administered medications for this visit.    Facility-Administered Medications Ordered in Other Visits  Medication Dose Route Frequency Provider Last Rate Last Dose  . 0.9 %  sodium chloride infusion   Intravenous Continuous Cammie Sickle, MD 10 mL/hr at 12/05/16 1030    . 0.9 %  sodium chloride infusion   Intravenous Continuous Cammie Sickle, MD   Stopped at 12/26/16 1011  . 0.9 %  sodium chloride infusion   Intravenous Continuous Charlaine Dalton R, MD 10 mL/hr at 12/27/16 0930    . heparin lock flush 100 unit/mL  500 Units Intravenous Once Charlaine Dalton R, MD      . heparin lock flush 100  unit/mL  500 Units Intravenous Once Charlaine Dalton R, MD      . sodium chloride flush (NS) 0.9 % injection 10 mL  10 mL Intravenous PRN Cammie Sickle, MD   10 mL at 11/06/16 1000  . sodium chloride flush (NS) 0.9 % injection 10 mL  10 mL Intravenous PRN Cammie Sickle, MD   10 mL at 11/07/16 0905  . sodium chloride flush (NS) 0.9 % injection 10 mL  10 mL Intravenous PRN Charlaine Dalton R, MD      . sodium chloride flush (NS) 0.9 % injection 10 mL  10 mL Intravenous PRN Cammie Sickle, MD   10 mL at 12/05/16 1030  . sodium chloride flush (NS) 0.9 % injection 10 mL  10 mL Intravenous PRN Charlaine Dalton R, MD   10 mL at 12/27/16 0930    PHYSICAL EXAMINATION: ECOG PERFORMANCE STATUS: 1 - Symptomatic but completely ambulatory  BP 120/81 (BP Location: Left Arm, Patient Position: Sitting,  Cuff Size: Normal)   Pulse 69   Temp 98 F (36.7 C) (Tympanic)   Resp 16   Wt 157 lb (71.2 kg)   BMI 24.59 kg/m   Filed Weights   09/17/18 1012  Weight: 157 lb (71.2 kg)    Physical Exam  Constitutional: She is oriented to person, place, and time and well-developed, well-nourished, and in no distress.  Accompanied by her husband.  Walking independently.  HENT:  Head: Normocephalic and atraumatic.  Mouth/Throat: Oropharynx is clear and moist. No oropharyngeal exudate.  Eyes: Pupils are equal, round, and reactive to light.  Neck: Normal range of motion. Neck supple.  Cardiovascular: Normal rate and regular rhythm.  Pulmonary/Chest: No respiratory distress. She has no wheezes.  Positive for crackles bilateral lung fields at the bases.  Abdominal: Soft. Bowel sounds are normal. She exhibits no distension and no mass. There is no abdominal tenderness. There is no rebound and no guarding.  Musculoskeletal: Normal range of motion.        General: No tenderness or edema.  Neurological: She is alert and oriented to person, place, and time.  Skin: Skin is warm.   Psychiatric: Affect normal.       LABORATORY DATA:  I have reviewed the data as listed    Component Value Date/Time   NA 139 09/17/2018 0948   K 3.8 09/17/2018 0948   K 4.1 07/01/2014 1429   CL 105 09/17/2018 0948   CO2 25 09/17/2018 0948   GLUCOSE 167 (H) 09/17/2018 0948   BUN 16 09/17/2018 0948   CREATININE 0.55 09/17/2018 0948   CALCIUM 8.9 09/17/2018 0948   PROT 6.6 09/17/2018 0948   ALBUMIN 4.0 09/17/2018 0948   AST 18 09/17/2018 0948   ALT 13 09/17/2018 0948   ALKPHOS 55 09/17/2018 0948   BILITOT 0.4 09/17/2018 0948   GFRNONAA >60 09/17/2018 0948   GFRAA >60 09/17/2018 0948    No results found for: SPEP, UPEP  Lab Results  Component Value Date   WBC 4.8 09/17/2018   NEUTROABS 2.7 09/17/2018   HGB 11.1 (L) 09/17/2018   HCT 35.8 (L) 09/17/2018   MCV 85.6 09/17/2018   PLT 195 09/17/2018      Chemistry      Component Value Date/Time   NA 139 09/17/2018 0948   K 3.8 09/17/2018 0948   K 4.1 07/01/2014 1429   CL 105 09/17/2018 0948   CO2 25 09/17/2018 0948   BUN 16 09/17/2018 0948   CREATININE 0.55 09/17/2018 0948      Component Value Date/Time   CALCIUM 8.9 09/17/2018 0948   ALKPHOS 55 09/17/2018 0948   AST 18 09/17/2018 0948   ALT 13 09/17/2018 0948   BILITOT 0.4 09/17/2018 0948       RADIOGRAPHIC STUDIES: I have personally reviewed the radiological images as listed and agreed with the findings in the report. No results found.   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  NOV 2019- CT scan- partial response/ continued regression; STABLE.   # Clinically STABLE; no concerns for any recurrence.    #Chronic mild nausea/intermittent headaches. Prn zofran.  Stable.   #Peripheral neuropathy/left foot drop- stable.   # ILD-stable.   #  DISPOSITION:  # follow up in 2 months/ labs-cbc/cmp/ldh/port flush- Dr.B  Orders Placed This Encounter  Procedures  . CT Abdomen Pelvis W Contrast    Standing Status:   Future    Standing Expiration Date:   09/17/2019    Order Specific Question:   ** REASON FOR EXAM (FREE TEXT)    Answer:   DLBCL    Order Specific Question:   If indicated for the ordered procedure, I authorize the administration of contrast media per Radiology protocol    Answer:   Yes    Order Specific Question:   Preferred imaging location?    Answer:   [facility]    Order Specific Question:   Is Oral Contrast requested for this exam?    Answer:   Yes, Per Radiology protocol    Order Specific Question:   Radiology Contrast Protocol - do NOT remove file path    Answer:   [email or web address]  . CBC with Differential    Standing Status:   Standing    Number of Occurrences:   6    Standing Expiration Date:   09/18/2019  . Creatinine, serum    Standing Status:   Standing    Number of Occurrences:   6    Standing Expiration Date:   09/18/2019  . Lactate dehydrogenase    Standing Status:   Standing    Number of Occurrences:   6    Standing Expiration Date:   09/18/2019   All questions were answered. The patient knows to call the clinic with any problems, questions or concerns.      Cammie Sickle, MD 09/17/2018 10:53 AM

## 2018-09-17 NOTE — Assessment & Plan Note (Addendum)
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  NOV 2019- CT scan- partial response/ continued regression; STABLE.   # Clinically STABLE; no concerns for any recurrence.    #Chronic mild nausea/intermittent headaches. Prn zofran.  Stable.   #Peripheral neuropathy/left foot drop- stable.   # ILD-stable.   # DISPOSITION:  # follow up in 2 months/ labs-cbc/cmp/ldh/port flush- Dr.B

## 2018-11-14 ENCOUNTER — Ambulatory Visit
Admission: RE | Admit: 2018-11-14 | Discharge: 2018-11-14 | Disposition: A | Discharge status: Home / Self Care | Payer: Medicare Other | Source: Ambulatory Visit | Attending: Internal Medicine | Admitting: Internal Medicine

## 2018-11-14 ENCOUNTER — Ambulatory Visit: Payer: Medicare Other

## 2018-11-14 ENCOUNTER — Other Ambulatory Visit: Payer: Self-pay

## 2018-11-14 DIAGNOSIS — C8333 Diffuse large B-cell lymphoma, intra-abdominal lymph nodes: Secondary | ICD-10-CM | POA: Diagnosis not present

## 2018-11-14 HISTORY — DX: Diffuse large B-cell lymphoma, unspecified site: C83.30

## 2018-11-14 LAB — POCT I-STAT CREATININE: Creatinine, Ser: 0.5 mg/dL (ref 0.44–1.00)

## 2018-11-14 MED ORDER — IOHEXOL 300 MG/ML  SOLN
75.0000 mL | Freq: Once | INTRAMUSCULAR | Status: AC | PRN
  Administered 2018-11-14: 75 mL via INTRAVENOUS

## 2018-11-17 ENCOUNTER — Other Ambulatory Visit: Payer: Self-pay

## 2018-11-17 ENCOUNTER — Inpatient Hospital Stay (HOSPITAL_BASED_OUTPATIENT_CLINIC_OR_DEPARTMENT_OTHER): Payer: Medicare Other | Admitting: Internal Medicine

## 2018-11-17 ENCOUNTER — Encounter: Payer: Self-pay | Admitting: Internal Medicine

## 2018-11-17 ENCOUNTER — Inpatient Hospital Stay: Payer: Medicare Other | Attending: Internal Medicine

## 2018-11-17 VITALS — BP 108/71 | HR 65 | Temp 99.0°F | Resp 21

## 2018-11-17 DIAGNOSIS — R110 Nausea: Secondary | ICD-10-CM | POA: Diagnosis not present

## 2018-11-17 DIAGNOSIS — G629 Polyneuropathy, unspecified: Secondary | ICD-10-CM | POA: Insufficient documentation

## 2018-11-17 DIAGNOSIS — Z9221 Personal history of antineoplastic chemotherapy: Secondary | ICD-10-CM | POA: Diagnosis not present

## 2018-11-17 DIAGNOSIS — Z79899 Other long term (current) drug therapy: Secondary | ICD-10-CM | POA: Insufficient documentation

## 2018-11-17 DIAGNOSIS — R51 Headache: Secondary | ICD-10-CM | POA: Insufficient documentation

## 2018-11-17 DIAGNOSIS — Z95828 Presence of other vascular implants and grafts: Secondary | ICD-10-CM

## 2018-11-17 DIAGNOSIS — M21372 Foot drop, left foot: Secondary | ICD-10-CM | POA: Diagnosis not present

## 2018-11-17 DIAGNOSIS — C8333 Diffuse large B-cell lymphoma, intra-abdominal lymph nodes: Secondary | ICD-10-CM

## 2018-11-17 LAB — CBC WITH DIFFERENTIAL/PLATELET
Abs Immature Granulocytes: 0.02 10*3/uL (ref 0.00–0.07)
Basophils Absolute: 0.1 10*3/uL (ref 0.0–0.1)
Basophils Relative: 1 %
Eosinophils Absolute: 0.3 10*3/uL (ref 0.0–0.5)
Eosinophils Relative: 5 %
HCT: 35.3 % — ABNORMAL LOW (ref 36.0–46.0)
Hemoglobin: 11.4 g/dL — ABNORMAL LOW (ref 12.0–15.0)
Immature Granulocytes: 0 %
Lymphocytes Relative: 18 %
Lymphs Abs: 0.9 10*3/uL (ref 0.7–4.0)
MCH: 28.6 pg (ref 26.0–34.0)
MCHC: 32.3 g/dL (ref 30.0–36.0)
MCV: 88.5 fL (ref 80.0–100.0)
Monocytes Absolute: 0.7 10*3/uL (ref 0.1–1.0)
Monocytes Relative: 14 %
NRBC: 0 % (ref 0.0–0.2)
Neutro Abs: 3.1 10*3/uL (ref 1.7–7.7)
Neutrophils Relative %: 62 %
Platelets: 191 10*3/uL (ref 150–400)
RBC: 3.99 MIL/uL (ref 3.87–5.11)
RDW: 14.7 % (ref 11.5–15.5)
WBC: 5 10*3/uL (ref 4.0–10.5)

## 2018-11-17 LAB — CREATININE, SERUM
CREATININE: 0.61 mg/dL (ref 0.44–1.00)
GFR calc Af Amer: >60 mL/min (ref >60)
GFR calc non Af Amer: >60 mL/min (ref >60)

## 2018-11-17 LAB — LACTATE DEHYDROGENASE: LDH: 157 U/L (ref 98–192)

## 2018-11-17 MED ORDER — SODIUM CHLORIDE 0.9% FLUSH
10.0000 mL | Freq: Once | INTRAVENOUS | Status: AC
  Administered 2018-11-17: 10 mL via INTRAVENOUS
  Filled 2018-11-17: qty 10

## 2018-11-17 MED ORDER — HEPARIN SOD (PORK) LOCK FLUSH 100 UNIT/ML IV SOLN
500.0000 [IU] | Freq: Once | INTRAVENOUS | Status: AC
  Administered 2018-11-17: 500 [IU] via INTRAVENOUS

## 2018-11-17 NOTE — Addendum Note (Signed)
Addended by: Sandria Bales B on: 11/17/2018 12:25 PM   Modules accepted: Orders

## 2018-11-17 NOTE — Assessment & Plan Note (Addendum)
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  March 2020-   # Clinically STABLE; no concerns for any recurrence.    # Chronic mild nausea/intermittent headaches. Prn zofran.  Stable.   #Peripheral neuropathy/left foot drop- stable.   # ILD-stable.   # # Educated the patient regarding novel coronavirus-modes of transmission/risks; and measures to avoid infection.   # DISPOSITION:  # follow up in 2 months-MD/ labs-cbc/cmp/ldh/port flush- Dr.B

## 2018-11-17 NOTE — Progress Notes (Signed)
I connected with [name]  on 11/17/18 at  2:30 PM EDTby telephone and verified that I am speaking with the patient using 2 identifiers.  # LOCATION:  Patient:home Provider: office  I discussed the limitations, risks, security and privacy concerns of performing an evaluation and management service by telephone and the availability of in person appointments.  I also discussed with the patient that there may be a patient responsible charge related to the service.  The patient expressed understanding and agrees to proceed.  History of present illness:[place] 73 y.o.  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  Patient last got chemotherapy in end of July 2019.  Denies any new shortness of breath or cough.  No chest pain.  No weight loss.  Chronic mild nausea/but no vomiting.  Intermittent headaches.  Not any worse.  Observation/objective: March 2020- CT scan-continued regression of the left infra-renal  lesion.  No new areas of suspected recurrence.   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  March 2020-   # Clinically STABLE; no concerns for any recurrence.    # Chronic mild nausea/intermittent headaches. Prn zofran.  Stable.   #Peripheral neuropathy/left foot drop- stable.   # ILD-stable.   # # Educated the patient regarding novel coronavirus-modes of transmission/risks; and measures to avoid infection.   # DISPOSITION:  # follow up in 2 months-MD/ labs-cbc/cmp/ldh/port flush- Dr.B    Follow-up instructions:  I discussed the assessment and treatment plan with the patient.  The patient was provided an opportunity to ask questions and all were answered.  The patient agreed with the plan and demonstrated understanding of instructions.  The patient was advised  to call back or seek an in person evaluation if the symptoms worsen or if the condition fails to improve as anticipated.  I provided 8 minutes of non-face-to-face time during this encounter   Dr. Charlaine Dalton [facility] 11/17/2018 12:13 PM

## 2018-11-19 ENCOUNTER — Ambulatory Visit: Admission: RE | Admit: 2018-11-19 | Payer: Medicare Other | Source: Home / Self Care | Admitting: Internal Medicine

## 2018-11-19 ENCOUNTER — Encounter: Admission: RE | Payer: Self-pay | Source: Home / Self Care

## 2018-11-19 SURGERY — EGD (ESOPHAGOGASTRODUODENOSCOPY)
Anesthesia: General

## 2019-01-16 ENCOUNTER — Inpatient Hospital Stay: Payer: Medicare Other

## 2019-01-16 ENCOUNTER — Inpatient Hospital Stay: Payer: Medicare Other | Admitting: Internal Medicine

## 2019-02-17 ENCOUNTER — Inpatient Hospital Stay (HOSPITAL_BASED_OUTPATIENT_CLINIC_OR_DEPARTMENT_OTHER): Payer: Medicare Other | Admitting: Internal Medicine

## 2019-02-17 ENCOUNTER — Inpatient Hospital Stay: Payer: Medicare Other | Attending: Internal Medicine

## 2019-02-17 ENCOUNTER — Other Ambulatory Visit: Payer: Self-pay

## 2019-02-17 DIAGNOSIS — I10 Essential (primary) hypertension: Secondary | ICD-10-CM | POA: Diagnosis not present

## 2019-02-17 DIAGNOSIS — Z7984 Long term (current) use of oral hypoglycemic drugs: Secondary | ICD-10-CM | POA: Diagnosis not present

## 2019-02-17 DIAGNOSIS — E119 Type 2 diabetes mellitus without complications: Secondary | ICD-10-CM | POA: Diagnosis not present

## 2019-02-17 DIAGNOSIS — E7800 Pure hypercholesterolemia, unspecified: Secondary | ICD-10-CM | POA: Insufficient documentation

## 2019-02-17 DIAGNOSIS — Z95828 Presence of other vascular implants and grafts: Secondary | ICD-10-CM

## 2019-02-17 DIAGNOSIS — Z87891 Personal history of nicotine dependence: Secondary | ICD-10-CM | POA: Insufficient documentation

## 2019-02-17 DIAGNOSIS — Z79899 Other long term (current) drug therapy: Secondary | ICD-10-CM | POA: Diagnosis not present

## 2019-02-17 DIAGNOSIS — C8333 Diffuse large B-cell lymphoma, intra-abdominal lymph nodes: Secondary | ICD-10-CM

## 2019-02-17 LAB — COMPREHENSIVE METABOLIC PANEL
ALT: 12 U/L (ref 0–44)
AST: 18 U/L (ref 15–41)
Albumin: 4 g/dL (ref 3.5–5.0)
Alkaline Phosphatase: 52 U/L (ref 38–126)
Anion gap: 7 (ref 5–15)
BUN: 14 mg/dL (ref 8–23)
CO2: 27 mmol/L (ref 22–32)
Calcium: 8.7 mg/dL — ABNORMAL LOW (ref 8.9–10.3)
Chloride: 108 mmol/L (ref 98–111)
Creatinine, Ser: 0.55 mg/dL (ref 0.44–1.00)
GFR calc Af Amer: >60 mL/min (ref >60)
GFR calc non Af Amer: >60 mL/min (ref >60)
Glucose, Bld: 141 mg/dL — ABNORMAL HIGH (ref 70–99)
Potassium: 3.6 mmol/L (ref 3.5–5.1)
Sodium: 142 mmol/L (ref 135–145)
Total Bilirubin: 0.5 mg/dL (ref 0.3–1.2)
Total Protein: 6.5 g/dL (ref 6.5–8.1)

## 2019-02-17 LAB — CBC WITH DIFFERENTIAL/PLATELET
Abs Immature Granulocytes: 0.02 10*3/uL (ref 0.00–0.07)
Basophils Absolute: 0 10*3/uL (ref 0.0–0.1)
Basophils Relative: 1 %
Eosinophils Absolute: 0.2 10*3/uL (ref 0.0–0.5)
Eosinophils Relative: 4 %
HCT: 37.1 % (ref 36.0–46.0)
Hemoglobin: 12.2 g/dL (ref 12.0–15.0)
Immature Granulocytes: 0 %
Lymphocytes Relative: 19 %
Lymphs Abs: 0.9 10*3/uL (ref 0.7–4.0)
MCH: 29.8 pg (ref 26.0–34.0)
MCHC: 32.9 g/dL (ref 30.0–36.0)
MCV: 90.7 fL (ref 80.0–100.0)
Monocytes Absolute: 0.6 10*3/uL (ref 0.1–1.0)
Monocytes Relative: 14 %
Neutro Abs: 2.9 10*3/uL (ref 1.7–7.7)
Neutrophils Relative %: 62 %
Platelets: 196 10*3/uL (ref 150–400)
RBC: 4.09 MIL/uL (ref 3.87–5.11)
RDW: 13.2 % (ref 11.5–15.5)
WBC: 4.7 10*3/uL (ref 4.0–10.5)
nRBC: 0 % (ref 0.0–0.2)

## 2019-02-17 LAB — LACTATE DEHYDROGENASE: LDH: 174 U/L (ref 98–192)

## 2019-02-17 MED ORDER — HEPARIN SOD (PORK) LOCK FLUSH 100 UNIT/ML IV SOLN
500.0000 [IU] | Freq: Once | INTRAVENOUS | Status: AC
  Administered 2019-02-17: 500 [IU] via INTRAVENOUS

## 2019-02-17 MED ORDER — SODIUM CHLORIDE 0.9% FLUSH
10.0000 mL | Freq: Once | INTRAVENOUS | Status: AC
  Administered 2019-02-17: 10 mL via INTRAVENOUS
  Filled 2019-02-17: qty 10

## 2019-02-17 MED ORDER — LIDOCAINE-PRILOCAINE 2.5-2.5 % EX CREA: TOPICAL_CREAM | CUTANEOUS | 1 refills | Status: DC

## 2019-02-17 NOTE — Progress Notes (Signed)
[facility] OFFICE PROGRESS NOTE  Patient Care Team: Rusty Aus, [name]  Cancer Staging No matching staging information was found for the patient.   Oncology History Overview Note  DEC 2017- DLBCL "TRIPLE HIT [myc/ bcl-2/bcl-6 gene rearrangement FISH]" ~10 cm mass RP LN; STAGE II [jan 2018- BMBx-NEG]; Jan 8th R-[facility];   # JAN 29th 2018- DA-R EPOCH x5 ; PET CR; s/p RT [last 03/18/2017]  # FEB-MARCH 2019- RECURRENCE of DLBCL [s/p RP Bx; 4 cm mass inferior to Left Kidney] ; II [facility]; Dr.Grover.   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  female pleasant patient above history of recurrent diffuse large B-cell lymphoma is here for follow-up.  Patient denies any abdominal pain nausea vomiting.  Appetite is good.  No weight loss.  No night sweats.  No lumps or bumps.  Chronic mild tingling and numbness in extremities.   Review of Systems  Constitutional: Positive for malaise/fatigue. Negative for chills, diaphoresis, fever and weight loss.  HENT: Negative for nosebleeds and sore throat.   Eyes: Negative for double vision.  Respiratory: Negative for cough, hemoptysis, sputum production, shortness of breath and wheezing.   Cardiovascular: Negative for chest pain, palpitations, orthopnea and leg swelling.  Gastrointestinal: Negative for abdominal pain, blood in stool, constipation, diarrhea, heartburn, melena and vomiting.  Genitourinary: Negative for dysuria, frequency and urgency.  Musculoskeletal:  Negative for back pain and joint pain.  Skin: Negative.  Negative for itching and rash.  Neurological: Positive for tingling. Negative for focal weakness and weakness.  Endo/Heme/Allergies: Does not bruise/bleed easily.  Psychiatric/Behavioral: Negative for depression. The patient is not nervous/anxious and does not have insomnia.       PAST MEDICAL HISTORY :  Past Medical History:  Diagnosis Date  . Diabetes mellitus without complication ([place])   . Diffuse large B-cell lymphoma (HCC)    Chemo + rad tx's  . Heart murmur   . History of chemotherapy 07/08/2017  . History of gastric ulcer   . History of radiation therapy 07/08/2017  . Hypercholesterolemia   . Hypertension   . ILD (interstitial lung disease) ([place])    8 yrs ago  . Lymphadenopathy     PAST SURGICAL HISTORY :   Past Surgical History:  Procedure Laterality Date  . BREAST BIOPSY Left 2010   neg  . CATARACT EXTRACTION W/PHACO Left 10/16/2017   Procedure: CATARACT EXTRACTION PHACO AND INTRAOCULAR LENS PLACEMENT ([place]) COMPLICATED DIABETIC LEFT;  Surgeon: Leandrew Koyanagi, MD;  Location: [facility];  Service: Ophthalmology;  Laterality: Left;  IRIS HOOKS Diabetic - oral meds  . HERNIA REPAIR    . PARTIAL HYSTERECTOMY     age 84  . PERIPHERAL VASCULAR CATHETERIZATION N/A 08/22/2016   Procedure: [name];  Surgeon: [name], MD;  Location: [facility];  Service: Cardiovascular;  Laterality: N/A;  . TONSILLECTOMY      FAMILY HISTORY :   Family History  Problem Relation Age of Onset  . Heart disease Mother   . Heart disease Father   . Heart disease Brother  SOCIAL HISTORY:   Social History   Tobacco Use  . Smoking status: Former Smoker    Quit date: 08/21/1958    Years since quitting: 60.5  . Smokeless tobacco: Never Used  Substance Use Topics  . Alcohol use: No  . Drug use: No    ALLERGIES:  is allergic to levaquin [levofloxacin in d5w]; cefuroxime axetil; and  doxycycline.  MEDICATIONS:  Current Outpatient Medications  Medication Sig Dispense Refill  . b complex vitamins tablet Take 1 tablet by mouth daily.    Marland Kitchen gabapentin (NEURONTIN) 300 MG capsule TAKE 1 CAPSULE BY MOUTH TWICE DAILY 180 capsule 4  . latanoprost (XALATAN) 0.005 % ophthalmic solution Place 1 drop into both eyes at bedtime.     . lidocaine-prilocaine (EMLA) cream Apply cream 1 hour before chemotherapy treatment, place small amount of saran wrap over cream to protect clothing. 30 g 1  . Magnesium 400 MG TABS Take 1 tablet by mouth daily.    . metFORMIN (GLUCOPHAGE) 500 MG tablet Take 250 mg by mouth daily with breakfast.     . metoprolol tartrate (LOPRESSOR) 25 MG tablet TAKE 1 TABLET BY MOUTH TWICE DAILY 180 tablet 0  . montelukast (SINGULAIR) 10 MG tablet One a day. 30 tablet 11  . ondansetron (ZOFRAN) 8 MG tablet One pill every 8 hours as needed for nausea/vomitting. 40 tablet 1  . pantoprazole (PROTONIX) 40 MG tablet TAKE 1 TABLET BY MOUTH TWICE DAILY    . pravastatin (PRAVACHOL) 40 MG tablet Take 1 tablet by mouth daily.    . prochlorperazine (COMPAZINE) 10 MG tablet Take 1 tablet (10 mg total) by mouth every 6 (six) hours as needed for nausea, vomiting or refractory nausea / vomiting. 40 tablet 0   No current facility-administered medications for this visit.    Facility-Administered Medications Ordered in Other Visits  Medication Dose Route Frequency Provider Last Rate Last Dose  . 0.9 %  sodium chloride infusion   Intravenous Continuous Cammie Sickle, MD 10 mL/hr at 12/05/16 1030    . 0.9 %  sodium chloride infusion   Intravenous Continuous Cammie Sickle, MD   Stopped at 12/26/16 1011  . 0.9 %  sodium chloride infusion   Intravenous Continuous Charlaine Dalton R, MD 10 mL/hr at 12/27/16 0930    . heparin lock flush 100 unit/mL  500 Units Intravenous Once Charlaine Dalton R, MD      . heparin lock flush 100 unit/mL  500 Units Intravenous Once Charlaine Dalton R, MD      . sodium chloride flush (NS) 0.9 % injection 10 mL  10 mL Intravenous PRN Cammie Sickle, MD   10 mL at 11/06/16 1000  . sodium chloride flush (NS) 0.9 % injection 10 mL  10 mL Intravenous PRN Cammie Sickle, MD   10 mL at 11/07/16 0905  . sodium chloride flush (NS) 0.9 % injection 10 mL  10 mL Intravenous PRN Charlaine Dalton R, MD      . sodium chloride flush (NS) 0.9 % injection 10 mL  10 mL Intravenous PRN Cammie Sickle, MD   10 mL at 12/05/16 1030  . sodium chloride flush (NS) 0.9 % injection 10 mL  10 mL Intravenous PRN Charlaine Dalton R, MD   10 mL at 12/27/16 0930    PHYSICAL EXAMINATION: ECOG PERFORMANCE STATUS: 1 - Symptomatic but completely ambulatory  BP 130/78   Pulse 76   Temp 98.5 F (36.9 C)   Resp 16  Wt 154 lb 4.8 oz (70 kg)   BMI 24.17 kg/m   Filed Weights   02/17/19 1019  Weight: 154 lb 4.8 oz (70 kg)    Physical Exam  Constitutional: She is oriented to person, place, and time and well-developed, well-nourished, and in no distress.  She is alone.  Walking independently.  HENT:  Head: Normocephalic and atraumatic.  Mouth/Throat: Oropharynx is clear and moist. No oropharyngeal exudate.  Eyes: Pupils are equal, round, and reactive to light.  Neck: Normal range of motion. Neck supple.  Cardiovascular: Normal rate and regular rhythm.  Pulmonary/Chest: No respiratory distress. She has no wheezes.  Positive for crackles bilateral lung fields at the bases.  Abdominal: Soft. Bowel sounds are normal. She exhibits no distension and no mass. There is no abdominal tenderness. There is no rebound and no guarding.  Musculoskeletal: Normal range of motion.        General: No tenderness or edema.  Neurological: She is alert and oriented to person, place, and time.  Skin: Skin is warm.  Psychiatric: Affect normal.       LABORATORY DATA:  I have reviewed the data as listed    Component Value Date/Time   NA 142  02/17/2019 0944   K 3.6 02/17/2019 0944   K 4.1 07/01/2014 1429   CL 108 02/17/2019 0944   CO2 27 02/17/2019 0944   GLUCOSE 141 (H) 02/17/2019 0944   BUN 14 02/17/2019 0944   CREATININE 0.55 02/17/2019 0944   CALCIUM 8.7 (L) 02/17/2019 0944   PROT 6.5 02/17/2019 0944   ALBUMIN 4.0 02/17/2019 0944   AST 18 02/17/2019 0944   ALT 12 02/17/2019 0944   ALKPHOS 52 02/17/2019 0944   BILITOT 0.5 02/17/2019 0944   GFRNONAA >60 02/17/2019 0944   GFRAA >60 02/17/2019 0944    No results found for: SPEP, UPEP  Lab Results  Component Value Date   WBC 4.7 02/17/2019   NEUTROABS 2.9 02/17/2019   HGB 12.2 02/17/2019   HCT 37.1 02/17/2019   MCV 90.7 02/17/2019   PLT 196 02/17/2019      Chemistry      Component Value Date/Time   NA 142 02/17/2019 0944   K 3.6 02/17/2019 0944   K 4.1 07/01/2014 1429   CL 108 02/17/2019 0944   CO2 27 02/17/2019 0944   BUN 14 02/17/2019 0944   CREATININE 0.55 02/17/2019 0944      Component Value Date/Time   CALCIUM 8.7 (L) 02/17/2019 0944   ALKPHOS 52 02/17/2019 0944   AST 18 02/17/2019 0944   ALT 12 02/17/2019 0944   BILITOT 0.5 02/17/2019 0944       RADIOGRAPHIC STUDIES: I have personally reviewed the radiological images as listed and agreed with the findings in the report. No results found.   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  March 2020- CT- improving size of the Left adrenal soft tissues mass.   # Clinically STABLE; no concerns for any recurrence.  We will plan to get a CT scan again in 3 months from now.  # Chronic mild nausea/intermittent headaches. Prn zofran.  Stable.   #Peripheral neuropathy/left foot drop- stable.   # ILD-stable.   # DISPOSITION:  # port flush in 6 weeks # follow up in 3 months-MD/  labs-cbc/cmp/ldh/port flush;  CT scan prior- Dr.B     Orders Placed This Encounter  Procedures  . CT CHEST W CONTRAST    Standing Status:   Future    Standing Expiration Date:   02/17/2020    Order Specific Question:   If indicated for the ordered procedure, I authorize the administration of contrast media per Radiology protocol    Answer:   Yes    Order Specific Question:   Preferred imaging location?    Answer:   [facility]    Order Specific Question:   Radiology Contrast Protocol - do NOT remove file path    Answer:   \\[email or web address]    Order Specific Question:   ** REASON FOR EXAM (FREE TEXT)    Answer:   lymphoma  . CT ABDOMEN PELVIS W CONTRAST    Standing Status:   Future    Standing Expiration Date:   02/17/2020    Order Specific Question:   If indicated for the ordered procedure, I authorize the administration of contrast media per Radiology protocol    Answer:   Yes    Order Specific Question:   Preferred imaging location?    Answer:   [facility]    Order Specific Question:   Radiology Contrast Protocol - do NOT remove file path    Answer:   \\[email or web address]    Order Specific Question:   ** REASON FOR EXAM (FREE TEXT)    Answer:   lymphoma   All questions were answered. The patient knows to call the clinic with any problems, questions or concerns.      Cammie Sickle, MD 02/17/2019 10:48 AM

## 2019-02-17 NOTE — Assessment & Plan Note (Addendum)
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  March 2020- CT- improving size of the Left adrenal soft tissues mass.   # Clinically STABLE; no concerns for any recurrence.  We will plan to get a CT scan again in 3 months from now.  # Chronic mild nausea/intermittent headaches. Prn zofran.  Stable.   #Peripheral neuropathy/left foot drop- stable.   # ILD-stable.   # DISPOSITION:  # port flush in 6 weeks # follow up in 3 months-MD/ labs-cbc/cmp/ldh/port flush; CT scan prior- Dr.B

## 2019-02-17 NOTE — Progress Notes (Signed)
Patient does not offer any problems today. Neuropathy is stable.  Refill on EMLA cream sent to pharmacy.

## 2019-03-30 ENCOUNTER — Other Ambulatory Visit: Payer: Self-pay | Admitting: Internal Medicine

## 2019-03-30 ENCOUNTER — Other Ambulatory Visit: Payer: Self-pay

## 2019-03-30 ENCOUNTER — Ambulatory Visit
Admission: RE | Admit: 2019-03-30 | Discharge: 2019-03-30 | Disposition: A | Discharge status: Home / Self Care | Payer: Medicare Other | Source: Ambulatory Visit | Attending: Internal Medicine | Admitting: Internal Medicine

## 2019-03-30 DIAGNOSIS — R1032 Left lower quadrant pain: Secondary | ICD-10-CM

## 2019-03-30 MED ORDER — IOHEXOL 300 MG/ML  SOLN
100.0000 mL | Freq: Once | INTRAMUSCULAR | Status: AC | PRN
  Administered 2019-03-30: 16:00:00 100 mL via INTRAVENOUS

## 2019-03-31 ENCOUNTER — Inpatient Hospital Stay (HOSPITAL_BASED_OUTPATIENT_CLINIC_OR_DEPARTMENT_OTHER): Payer: Medicare Other | Admitting: Internal Medicine

## 2019-03-31 ENCOUNTER — Other Ambulatory Visit: Payer: Self-pay | Admitting: *Deleted

## 2019-03-31 ENCOUNTER — Ambulatory Visit: Payer: Medicare Other

## 2019-03-31 ENCOUNTER — Telehealth: Payer: Self-pay | Admitting: *Deleted

## 2019-03-31 ENCOUNTER — Inpatient Hospital Stay: Payer: Medicare Other | Attending: Internal Medicine

## 2019-03-31 ENCOUNTER — Other Ambulatory Visit: Payer: Self-pay

## 2019-03-31 ENCOUNTER — Telehealth: Payer: Self-pay | Admitting: Internal Medicine

## 2019-03-31 VITALS — BP 150/83 | HR 67 | Temp 99.1°F | Wt 153.4 lb

## 2019-03-31 DIAGNOSIS — Z7984 Long term (current) use of oral hypoglycemic drugs: Secondary | ICD-10-CM | POA: Insufficient documentation

## 2019-03-31 DIAGNOSIS — Z5112 Encounter for antineoplastic immunotherapy: Secondary | ICD-10-CM | POA: Diagnosis present

## 2019-03-31 DIAGNOSIS — R51 Headache: Secondary | ICD-10-CM | POA: Diagnosis not present

## 2019-03-31 DIAGNOSIS — I10 Essential (primary) hypertension: Secondary | ICD-10-CM | POA: Insufficient documentation

## 2019-03-31 DIAGNOSIS — C8333 Diffuse large B-cell lymphoma, intra-abdominal lymph nodes: Secondary | ICD-10-CM | POA: Insufficient documentation

## 2019-03-31 DIAGNOSIS — E7800 Pure hypercholesterolemia, unspecified: Secondary | ICD-10-CM | POA: Diagnosis not present

## 2019-03-31 DIAGNOSIS — E119 Type 2 diabetes mellitus without complications: Secondary | ICD-10-CM | POA: Diagnosis not present

## 2019-03-31 DIAGNOSIS — Z87891 Personal history of nicotine dependence: Secondary | ICD-10-CM | POA: Insufficient documentation

## 2019-03-31 DIAGNOSIS — Z79899 Other long term (current) drug therapy: Secondary | ICD-10-CM | POA: Diagnosis not present

## 2019-03-31 DIAGNOSIS — Z95828 Presence of other vascular implants and grafts: Secondary | ICD-10-CM

## 2019-03-31 LAB — COMPREHENSIVE METABOLIC PANEL
ALT: 41 U/L (ref 0–44)
AST: 42 U/L — ABNORMAL HIGH (ref 15–41)
Albumin: 4 g/dL (ref 3.5–5.0)
Alkaline Phosphatase: 77 U/L (ref 38–126)
Anion gap: 10 (ref 5–15)
BUN: 13 mg/dL (ref 8–23)
CO2: 27 mmol/L (ref 22–32)
Calcium: 9.2 mg/dL (ref 8.9–10.3)
Chloride: 106 mmol/L (ref 98–111)
Creatinine, Ser: 0.55 mg/dL (ref 0.44–1.00)
GFR calc Af Amer: >60 mL/min (ref >60)
GFR calc non Af Amer: >60 mL/min (ref >60)
Glucose, Bld: 119 mg/dL — ABNORMAL HIGH (ref 70–99)
Potassium: 3.7 mmol/L (ref 3.5–5.1)
Sodium: 143 mmol/L (ref 135–145)
Total Bilirubin: 0.5 mg/dL (ref 0.3–1.2)
Total Protein: 6.6 g/dL (ref 6.5–8.1)

## 2019-03-31 LAB — CBC WITH DIFFERENTIAL/PLATELET
Abs Immature Granulocytes: 0.02 10*3/uL (ref 0.00–0.07)
Basophils Absolute: 0.1 10*3/uL (ref 0.0–0.1)
Basophils Relative: 1 %
Eosinophils Absolute: 0.2 10*3/uL (ref 0.0–0.5)
Eosinophils Relative: 2 %
HCT: 37.4 % (ref 36.0–46.0)
Hemoglobin: 12.6 g/dL (ref 12.0–15.0)
Immature Granulocytes: 0 %
Lymphocytes Relative: 15 %
Lymphs Abs: 1 10*3/uL (ref 0.7–4.0)
MCH: 31.1 pg (ref 26.0–34.0)
MCHC: 33.7 g/dL (ref 30.0–36.0)
MCV: 92.3 fL (ref 80.0–100.0)
Monocytes Absolute: 0.8 10*3/uL (ref 0.1–1.0)
Monocytes Relative: 13 %
Neutro Abs: 4.6 10*3/uL (ref 1.7–7.7)
Neutrophils Relative %: 69 %
Platelets: 193 10*3/uL (ref 150–400)
RBC: 4.05 MIL/uL (ref 3.87–5.11)
RDW: 12.7 % (ref 11.5–15.5)
WBC: 6.6 10*3/uL (ref 4.0–10.5)
nRBC: 0 % (ref 0.0–0.2)

## 2019-03-31 LAB — LACTATE DEHYDROGENASE: LDH: 242 U/L — ABNORMAL HIGH (ref 98–192)

## 2019-03-31 MED ORDER — SODIUM CHLORIDE 0.9% FLUSH
10.0000 mL | Freq: Once | INTRAVENOUS | Status: AC
  Administered 2019-03-31: 10 mL via INTRAVENOUS
  Filled 2019-03-31: qty 10

## 2019-03-31 MED ORDER — HEPARIN SOD (PORK) LOCK FLUSH 100 UNIT/ML IV SOLN
500.0000 [IU] | Freq: Once | INTRAVENOUS | Status: AC
  Administered 2019-03-31: 10:00:00 500 [IU] via INTRAVENOUS

## 2019-03-31 NOTE — Progress Notes (Signed)
[name] OFFICE PROGRESS NOTE  Patient Care Team: Rusty Aus, [name]  Cancer Staging No matching staging information was found for the patient.   Oncology History Overview Note  DEC 2017- DLBCL "TRIPLE HIT [myc/ bcl-2/bcl-6 gene rearrangement FISH]" ~10 cm mass RP LN; STAGE II [jan 2018- BMBx-NEG]; Jan 8th R-CHOP;   # JAN 29th 2018- DA-R EPOCH x5 ; PET CR; s/p RT [last 03/18/2017]  # FEB-MARCH 2019- RECURRENCE of DLBCL [s/p RP Bx; 4 cm mass inferior to Left Kidney] ; II [facility]; Dr.Grover.   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  Patient noted to have worsening left hip pain/noted to have a lump in the left flank over the last 2 to 3 weeks.  Progressive getting worse.  Worse with movement.  This led to a CT scan with his PCP/Dr. Rebeca Allegra unfortunately showed recurrent malignancy like lymphoma-approximately 9 to 10 cm in size.   Patient has been taking Tylenol/Motrin.  Without any significant improvement in pain.  She has a prescription of tramadol which has not started yet.   Review of Systems  Constitutional: Positive for malaise/fatigue. Negative for chills, diaphoresis, fever and weight loss.  HENT: Negative for nosebleeds and sore throat.   Eyes: Negative for double vision.  Respiratory: Negative for cough, hemoptysis, sputum  production, shortness of breath and wheezing.   Cardiovascular: Negative for chest pain, palpitations, orthopnea and leg swelling.  Gastrointestinal: Negative for abdominal pain, blood in stool, constipation, diarrhea, heartburn, melena and vomiting.  Genitourinary: Negative for dysuria, frequency and urgency.  Musculoskeletal: Positive for back pain and joint pain.  Skin: Negative.  Negative for itching and rash.  Neurological: Positive for tingling. Negative for focal weakness and weakness.  Endo/Heme/Allergies: Does not bruise/bleed easily.  Psychiatric/Behavioral: Negative for depression. The patient is not nervous/anxious and does not have insomnia.       PAST MEDICAL HISTORY :  Past Medical History:  Diagnosis Date  . Diabetes mellitus without complication ([place])   . Diffuse large B-cell lymphoma ([facility])    Chemo + rad tx's  . Heart murmur   . History of chemotherapy 07/08/2017  . History of gastric ulcer   . History of radiation therapy 07/08/2017  . Hypercholesterolemia   . Hypertension   . ILD (interstitial lung disease) ([place])    8 yrs ago  . Lymphadenopathy     PAST SURGICAL HISTORY :   Past Surgical History:  Procedure Laterality Date  . BREAST BIOPSY Left 2010   neg  . CATARACT EXTRACTION W/PHACO Left 10/16/2017   Procedure: CATARACT EXTRACTION PHACO AND INTRAOCULAR LENS PLACEMENT ([place]) COMPLICATED DIABETIC LEFT;  Surgeon: Leandrew Koyanagi, MD;  Location: [facility];  Service: Ophthalmology;  Laterality: Left;  IRIS HOOKS Diabetic - oral meds  . HERNIA REPAIR    . PARTIAL HYSTERECTOMY     age 51  . PERIPHERAL VASCULAR CATHETERIZATION N/A 08/22/2016   Procedure: [name];  Surgeon: Belenda Cruise  Eloise Levels, MD;  Location: [facility];  Service: Cardiovascular;  Laterality: N/A;  . TONSILLECTOMY      FAMILY HISTORY :   Family History  Problem Relation Age of Onset  . Heart disease Mother   . Heart disease Father   . Heart disease  Brother     SOCIAL HISTORY:   Social History   Tobacco Use  . Smoking status: Former Smoker    Quit date: 08/21/1958    Years since quitting: 60.6  . Smokeless tobacco: Never Used  Substance Use Topics  . Alcohol use: No  . Drug use: No    ALLERGIES:  is allergic to levaquin [levofloxacin in d5w]; cefuroxime axetil; and doxycycline.  MEDICATIONS:  Current Outpatient Medications  Medication Sig Dispense Refill  . b complex vitamins tablet Take 1 tablet by mouth daily.    Marland Kitchen dicyclomine (BENTYL) 10 MG capsule Take by mouth.    . gabapentin (NEURONTIN) 300 MG capsule Take by mouth.    . latanoprost (XALATAN) 0.005 % ophthalmic solution Place 1 drop into both eyes at bedtime.     . Magnesium 400 MG TABS Take 1 tablet by mouth daily.    . metFORMIN (GLUCOPHAGE) 500 MG tablet Take 250 mg by mouth daily with breakfast.     . metoprolol tartrate (LOPRESSOR) 25 MG tablet TAKE 1 TABLET BY MOUTH TWICE DAILY 180 tablet 0  . montelukast (SINGULAIR) 10 MG tablet One a day. 30 tablet 11  . pantoprazole (PROTONIX) 40 MG tablet TAKE 1 TABLET BY MOUTH TWICE DAILY    . pravastatin (PRAVACHOL) 40 MG tablet Take 1 tablet by mouth daily.    . traMADol (ULTRAM) 50 MG tablet Take by mouth.    . lidocaine-prilocaine (EMLA) cream Apply cream 1 hour before chemotherapy treatment, place small amount of saran wrap over cream to protect clothing. (Patient not taking: Reported on 03/31/2019) 30 g 1  . ondansetron (ZOFRAN) 8 MG tablet One pill every 8 hours as needed for nausea/vomitting. (Patient not taking: Reported on 03/31/2019) 40 tablet 1  . prochlorperazine (COMPAZINE) 10 MG tablet Take 1 tablet (10 mg total) by mouth every 6 (six) hours as needed for nausea, vomiting or refractory nausea / vomiting. (Patient not taking: Reported on 03/31/2019) 40 tablet 0   No current facility-administered medications for this visit.    Facility-Administered Medications Ordered in Other Visits  Medication Dose Route  Frequency Provider Last Rate Last Dose  . 0.9 %  sodium chloride infusion   Intravenous Continuous Cammie Sickle, MD 10 mL/hr at 12/05/16 1030    . 0.9 %  sodium chloride infusion   Intravenous Continuous Cammie Sickle, MD   Stopped at 12/26/16 1011  . 0.9 %  sodium chloride infusion   Intravenous Continuous Charlaine Dalton R, MD 10 mL/hr at 12/27/16 0930    . heparin lock flush 100 unit/mL  500 Units Intravenous Once Charlaine Dalton R, MD      . heparin lock flush 100 unit/mL  500 Units Intravenous Once Charlaine Dalton R, MD      . sodium chloride flush (NS) 0.9 % injection 10 mL  10 mL Intravenous PRN Cammie Sickle, MD   10 mL at 11/06/16 1000  . sodium chloride flush (NS) 0.9 % injection 10 mL  10 mL Intravenous PRN Cammie Sickle, MD   10 mL at 11/07/16 0905  . sodium chloride flush (NS) 0.9 % injection 10 mL  10 mL Intravenous PRN Toshiko Kemler,  Elisha Headland, MD      . sodium chloride flush (NS) 0.9 % injection 10 mL  10 mL Intravenous PRN Cammie Sickle, MD   10 mL at 12/05/16 1030  . sodium chloride flush (NS) 0.9 % injection 10 mL  10 mL Intravenous PRN Cammie Sickle, MD   10 mL at 12/27/16 0930    PHYSICAL EXAMINATION: ECOG PERFORMANCE STATUS: 1 - Symptomatic but completely ambulatory  BP (!) 150/83 (Patient Position: Sitting)   Pulse 67   Temp 99.1 F (37.3 C) (Tympanic)   Wt 153 lb 6.4 oz (69.6 kg)   BMI 24.03 kg/m   Filed Weights   03/31/19 1036  Weight: 153 lb 6.4 oz (69.6 kg)    Physical Exam  Constitutional: She is oriented to person, place, and time and well-developed, well-nourished, and in no distress.  She is alone.  Walking independently.  HENT:  Head: Normocephalic and atraumatic.  Mouth/Throat: Oropharynx is clear and moist. No oropharyngeal exudate.  Eyes: Pupils are equal, round, and reactive to light.  Neck: Normal range of motion. Neck supple.  Cardiovascular: Normal rate and regular rhythm.   Pulmonary/Chest: No respiratory distress. She has no wheezes.  Positive for crackles bilateral lung fields at the bases.  Abdominal: Soft. Bowel sounds are normal. She exhibits no distension and no mass. There is no abdominal tenderness. There is no rebound and no guarding.  Musculoskeletal: Normal range of motion.        General: No tenderness or edema.  Neurological: She is alert and oriented to person, place, and time.  Skin: Skin is warm.  Psychiatric: Affect normal.       LABORATORY DATA:  I have reviewed the data as listed    Component Value Date/Time   NA 143 03/31/2019 1010   K 3.7 03/31/2019 1010   K 4.1 07/01/2014 1429   CL 106 03/31/2019 1010   CO2 27 03/31/2019 1010   GLUCOSE 119 (H) 03/31/2019 1010   BUN 13 03/31/2019 1010   CREATININE 0.55 03/31/2019 1010   CALCIUM 9.2 03/31/2019 1010   PROT 6.6 03/31/2019 1010   ALBUMIN 4.0 03/31/2019 1010   AST 42 (H) 03/31/2019 1010   ALT 41 03/31/2019 1010   ALKPHOS 77 03/31/2019 1010   BILITOT 0.5 03/31/2019 1010   GFRNONAA >60 03/31/2019 1010   GFRAA >60 03/31/2019 1010    No results found for: SPEP, UPEP  Lab Results  Component Value Date   WBC 6.6 03/31/2019   NEUTROABS 4.6 03/31/2019   HGB 12.6 03/31/2019   HCT 37.4 03/31/2019   MCV 92.3 03/31/2019   PLT 193 03/31/2019      Chemistry      Component Value Date/Time   NA 143 03/31/2019 1010   K 3.7 03/31/2019 1010   K 4.1 07/01/2014 1429   CL 106 03/31/2019 1010   CO2 27 03/31/2019 1010   BUN 13 03/31/2019 1010   CREATININE 0.55 03/31/2019 1010      Component Value Date/Time   CALCIUM 9.2 03/31/2019 1010   ALKPHOS 77 03/31/2019 1010   AST 42 (H) 03/31/2019 1010   ALT 41 03/31/2019 1010   BILITOT 0.5 03/31/2019 1010       RADIOGRAPHIC STUDIES: I have personally reviewed the radiological images as listed and agreed with the findings in the report. Ct Abdomen Pelvis W Contrast  Result Date: 03/30/2019 CLINICAL DATA:  Left lower quadrant  intermittent pain. EXAM: CT ABDOMEN AND PELVIS WITH CONTRAST TECHNIQUE: Multidetector CT  imaging of the abdomen and pelvis was performed using the standard protocol following bolus administration of intravenous contrast. CONTRAST:  165mL OMNIPAQUE IOHEXOL 300 MG/ML  SOLN COMPARISON:  November 14, 2018 FINDINGS: Lower chest: There are chronic lung changes at the lung bases bilaterally favored to represent underlying pulmonary fibrosis.The heart size is normal. Hepatobiliary: There is scattered hypoattenuating areas throughout the liver which are statistically most likely to represent benign cysts. Normal gallbladder.There is no biliary ductal dilation. Pancreas: Normal contours without ductal dilatation. No peripancreatic fluid collection. Spleen: There is a stable hypoattenuating nodule at the splenic hilum. Adrenals/Urinary Tract: --Adrenal glands: No adrenal hemorrhage. --Right kidney/ureter: No hydronephrosis or perinephric hematoma. --Left kidney/ureter: No hydronephrosis or perinephric hematoma. --Urinary bladder: Unremarkable. Stomach/Bowel: --Stomach/Duodenum: There is a moderate-sized hiatal hernia. --Small bowel: No dilatation or inflammation. --Colon: There is an above average amount of stool throughout the colon. There is scattered colonic diverticula without CT evidence for diverticulitis. --Appendix: Normal. Vascular/Lymphatic: Atherosclerotic calcification is present within the non-aneurysmal abdominal aorta, without hemodynamically significant stenosis. --No retroperitoneal lymphadenopathy. --No mesenteric lymphadenopathy. --No pelvic or inguinal lymphadenopathy. Reproductive: Status post hysterectomy. No adnexal mass. Other: There is a large 9.3 by 8.2 by 14 cm mass that appears to be centered within the left posterior pararenal space versus is the left quadratus lumborum and left psoas muscles. There appears to be extension into the left iliocostalis muscle. The mass appears to extend into the left  iliacus muscle. There is extension towards the left L3 neural foramen. There is no definite extension into the spinal canal. There is a satellite mass measuring 3.6 cm in the subcutaneous fat of the low posterior back on the left. There are few small satellite nodules measuring 1.1 and 0.9 cm adjacent to the left iliacus muscle (axial series 2, image 64 and axial series 2, image 67). Musculoskeletal. No acute displaced fractures. IMPRESSION: 1. Large soft tissue mass as detailed above measuring approximately 9.3 x 8.2 x 14 cm. This mass appears to be centered within the left posterior pararenal space or the left psoas and quadratus lumborum muscles. There is extension into the left iliacus and left iliocostalis muscles. There is a satellite 3.6 cm mass in the adjacent subcutaneous fat. There are additional small satellite nodules adjacent to the left iliacus muscle as detailed above. There is no definite extension into the spinal canal, however this would be better evaluated with MRI. 2. Additional chronic findings as above. These results will be called to the ordering clinician or representative by the Radiologist Assistant, and communication documented in the PACS or zVision Dashboard. Electronically Signed   By: Constance Holster M.D.   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  #Above findings highly suggestive of recurrence; recommend PET scan ASAP.  Since patient had biopsy for recurrence about a year ago I think it is not necessary to proceed with biopsy at this time.    #Discussed with the patient that given this is most likely recurrent developed B-cell lymphoma-which is fairly aggressive given interval between the scans.  Discussed options include-aggressive options like-intensive chemotherapy/stem cell transplant [patient previously evaluated at  [facility] poor candidate for transplant].  Discussed regarding promising car T-cell treatment options.  Also discussed regarding palliative options- POLA-Rituxan-Benda every 3 weeks-with complete response is about 40%.  Discussed that I will reach out to Dr. Thera Flake at [facility]  # Chronic mild nausea/intermittent headaches. Prn zofran.  Stable.   #Peripheral neuropathy/left foot drop- stable.   # ILD-clinically stable.  #I tried to reach patient's husband-poor connection.  # DISPOSITION:  # # PET ASAP # follow up TBD- Dr.B     Orders Placed This Encounter  Procedures  . NM PET Image Restag (PS) Skull Base To Thigh    Standing Status:   Future    Standing Expiration Date:   03/30/2020    Order Specific Question:   ** REASON FOR EXAM (FREE TEXT)    Answer:   recurrent lymphoma    Order Specific Question:   If indicated for the ordered procedure, I authorize the administration of a radiopharmaceutical per Radiology protocol    Answer:   Yes    Order Specific Question:   Radiology Contrast Protocol - do NOT remove file path    Answer:   [email or web address]   All questions were answered. The patient knows to call the clinic with any problems, questions or concerns.      Cammie Sickle, MD 03/31/2019 1:05 PM

## 2019-03-31 NOTE — Telephone Encounter (Signed)
Spoke to patient's husband, Edd Arbour regarding patient's recurrent malignancy-plan for reinitiation of treatment.  Discussed that we will check with Dr. Thera Flake at [facility].  They are interested in Car-T therapy; otherwise palliative chemotherapy.

## 2019-03-31 NOTE — Assessment & Plan Note (Addendum)
#   RECURRENT DLBCL-  triple hit diffuse large B-cell lymphoma [ March 2019] most recently on RGDP [carboplatin]; Patient finished 6 cycles a on July 31st 2019; March 30, 2019-significant worsening of the disease in the left pararenal space/retroperitoneal [9-10 centimeters mass]; approximately 3 to 4 cm centimeter satellite nodule in the subcutaneous fat.  #Above findings highly suggestive of recurrence; recommend PET scan ASAP.  Since patient had biopsy for recurrence about a year ago I think it is not necessary to proceed with biopsy at this time.   #Discussed with the patient that given this is most likely recurrent developed B-cell lymphoma-which is fairly aggressive given interval between the scans.  Discussed options include-aggressive options like-intensive chemotherapy/stem cell transplant [patient previously evaluated at [facility] poor candidate for transplant].  Discussed regarding promising car T-cell treatment options.  Also discussed regarding palliative options- POLA-Rituxan-Benda every 3 weeks-with complete response is about 40%.  Discussed that I will reach out to Dr. Thera Flake at [facility]  # Chronic mild nausea/intermittent headaches. Prn zofran.  Stable.   #Peripheral neuropathy/left foot drop- stable.   # ILD-clinically stable.  #I tried to reach patient's husband-poor connection.  # DISPOSITION:  # # PET ASAP # follow up TBD- Dr.B

## 2019-03-31 NOTE — Telephone Encounter (Signed)
[place] from Dr Sanjuan Dame office called requesting that patient who has a port flush appointment at 10 be added on to see Dr Rogue Bussing for a new abdominal mass concerning for return of her lymphoma. I spoke with Vickki Muff RN and she discussed with Dr Rogue Bussing and patient will be added to his schedule today with lab/ md

## 2019-04-01 ENCOUNTER — Other Ambulatory Visit: Payer: Self-pay | Admitting: Internal Medicine

## 2019-04-01 ENCOUNTER — Telehealth: Payer: Self-pay | Admitting: Internal Medicine

## 2019-04-01 DIAGNOSIS — Z5181 Encounter for therapeutic drug level monitoring: Secondary | ICD-10-CM

## 2019-04-01 DIAGNOSIS — C8333 Diffuse large B-cell lymphoma, intra-abdominal lymph nodes: Secondary | ICD-10-CM

## 2019-04-01 NOTE — Progress Notes (Signed)
As per discussion with Dr. Raylene Everts at [facility] recommend 2D echo.  Please schedule ASAP.  I will talk with her husband later in the afternoon.

## 2019-04-01 NOTE — Telephone Encounter (Signed)
Discussed with Dr. Raylene Everts at [facility] a potential candidate for car T-cell therapy.  [facility] office will call patient for an appointment.  Patient candidate for radiation; discussed with Dr. Donella Stade.  Appointment on the 17th.  Order 2D echo/will discuss/coordinate with Dr. Thera Flake.  Discussed with the patient/in agreement.

## 2019-04-02 ENCOUNTER — Ambulatory Visit: Payer: Medicare Other

## 2019-04-03 ENCOUNTER — Other Ambulatory Visit: Payer: Self-pay

## 2019-04-06 ENCOUNTER — Other Ambulatory Visit: Payer: Self-pay

## 2019-04-06 ENCOUNTER — Encounter: Payer: Self-pay | Admitting: Radiation Oncology

## 2019-04-06 ENCOUNTER — Telehealth: Payer: Self-pay | Admitting: Internal Medicine

## 2019-04-06 ENCOUNTER — Other Ambulatory Visit: Payer: Self-pay | Admitting: Internal Medicine

## 2019-04-06 ENCOUNTER — Ambulatory Visit
Admission: RE | Admit: 2019-04-06 | Discharge: 2019-04-06 | Disposition: A | Discharge status: Home / Self Care | Payer: Medicare Other | Source: Ambulatory Visit | Attending: Radiation Oncology | Admitting: Radiation Oncology

## 2019-04-06 VITALS — BP 121/66 | HR 68 | Temp 98.5°F | Resp 16 | Wt 149.3 lb

## 2019-04-06 DIAGNOSIS — R011 Cardiac murmur, unspecified: Secondary | ICD-10-CM | POA: Insufficient documentation

## 2019-04-06 DIAGNOSIS — Z8711 Personal history of peptic ulcer disease: Secondary | ICD-10-CM | POA: Diagnosis not present

## 2019-04-06 DIAGNOSIS — I10 Essential (primary) hypertension: Secondary | ICD-10-CM | POA: Diagnosis not present

## 2019-04-06 DIAGNOSIS — C8333 Diffuse large B-cell lymphoma, intra-abdominal lymph nodes: Secondary | ICD-10-CM | POA: Diagnosis not present

## 2019-04-06 DIAGNOSIS — Z79899 Other long term (current) drug therapy: Secondary | ICD-10-CM | POA: Insufficient documentation

## 2019-04-06 DIAGNOSIS — E119 Type 2 diabetes mellitus without complications: Secondary | ICD-10-CM | POA: Insufficient documentation

## 2019-04-06 DIAGNOSIS — Z87891 Personal history of nicotine dependence: Secondary | ICD-10-CM | POA: Insufficient documentation

## 2019-04-06 DIAGNOSIS — Z7984 Long term (current) use of oral hypoglycemic drugs: Secondary | ICD-10-CM | POA: Diagnosis not present

## 2019-04-06 DIAGNOSIS — E7800 Pure hypercholesterolemia, unspecified: Secondary | ICD-10-CM | POA: Diagnosis not present

## 2019-04-06 DIAGNOSIS — M79605 Pain in left leg: Secondary | ICD-10-CM | POA: Insufficient documentation

## 2019-04-06 DIAGNOSIS — J849 Interstitial pulmonary disease, unspecified: Secondary | ICD-10-CM | POA: Diagnosis not present

## 2019-04-06 NOTE — Telephone Encounter (Signed)
Spoke to insurance Dr. Leane Para per Medicare guidelines a patient could get a PET scan only 3 times in lifetime for the same diagnosis.  Cancel the PET scan.

## 2019-04-06 NOTE — Telephone Encounter (Signed)
H/C- Please have the patient follow-up next Monday 8/24-MD -labs CBC BMP LDH.  Rituximab infusion.

## 2019-04-06 NOTE — Progress Notes (Signed)
DISCONTINUE OFF PATHWAY REGIMEN - Lymphoma and CLL   [id number]:R-GDP (rituximab, gemcitabine, dexamethasone, cisplatin) q21 days:   A cycle is every 21 days:     Dexamethasone      Gemcitabine      Cisplatin      Rituximab   **Always confirm dose/schedule in your pharmacy ordering system**  REASON: Disease Progression PRIOR TREATMENT: Off Pathway: R-GDP (rituximab, gemcitabine, dexamethasone, cisplatin) q21 days TREATMENT RESPONSE: Progressive Disease (PD)  START OFF PATHWAY REGIMEN - Lymphoma and CLL   [id number]:Rituximab (IV/Subcut) D1 Weekly:   A cycle is every 7 days:     Rituximab-xxxx      Rituximab and hyaluronidase human   **Always confirm dose/schedule in your pharmacy ordering system**  Patient Characteristics: Double Hit Lymphoma, Second Line and Beyond Disease Type: Not Applicable Disease Type: Double Hit Lymphoma Disease Type: Not Applicable Line of therapy: Second Line and Beyond Intent of Therapy: Non-Curative / Palliative Intent, Discussed with Patient

## 2019-04-06 NOTE — Consult Note (Signed)
NEW PATIENT EVALUATION  Name: [place] [facility]  MRN: [id number]  Date:   04/06/2019     DOB: [birth year]   This 73 y.o. 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  Her B-cell lymphoma is triple hit  REFERRING PHYSICIAN: Rusty Aus, MD  CHIEF COMPLAINT:  Chief Complaint  Patient presents with  . Cancer    OPNA- lymphoma    DIAGNOSIS: The encounter diagnosis was Diffuse large B-cell lymphoma of intra-abdominal lymph nodes ([place]).   PREVIOUS INVESTIGATIONS:  CT scans reviewed PET CT scan pending Pathology report reviewed Clinical notes reviewed Prior treatment fields reviewed  HPI: Patient is a 73 year old female treated back in 2017 for involved field of diffuse B-cell lymphoma to the retroperitoneum after excellent response to R-CHOP chemotherapy.  She is now developed a large mass measuring 9.3 x 8.2 cm centered within the left posterior pararenal space adjacent to the left left psoas muscle.  There are small satellite nodules adjacent to the left iliac this muscle as detailed above.  She is having considerable pain with radicular type pain into the left lower extremity.  She states she also her bowel movements are mostly mucus at this time from unknown etiology.  Her lower urinary tract symptoms are normal.  Patient has been [facility] at [facility] and is felt to be a poor risk for bone marrow transplant may be a candidate for cart cell treatment.  She is seen today for consideration of radiation therapy to this mass.  PLANNED TREATMENT REGIMEN: IMRT radiation therapy to left pelvic mass  PAST MEDICAL HISTORY:  has a past medical history of Diabetes mellitus without complication ([place]), Diffuse large B-cell lymphoma ([place]), Heart murmur,  History of chemotherapy (07/08/2017), History of gastric ulcer, History of radiation therapy (07/08/2017), Hypercholesterolemia, Hypertension, ILD (interstitial lung disease) ([place]), and Lymphadenopathy.    PAST SURGICAL HISTORY:  Past Surgical History:  Procedure Laterality Date  . BREAST BIOPSY Left 2010   neg  . CATARACT EXTRACTION W/PHACO Left 10/16/2017   Procedure: CATARACT EXTRACTION PHACO AND INTRAOCULAR LENS PLACEMENT ([place]) COMPLICATED DIABETIC LEFT;  Surgeon: Leandrew Koyanagi, MD;  Location: [place];  Service: Ophthalmology;  Laterality: Left;  IRIS HOOKS Diabetic - oral meds  . HERNIA REPAIR    . PARTIAL HYSTERECTOMY     age 62  . PERIPHERAL VASCULAR CATHETERIZATION N/A 08/22/2016   Procedure: [name];  Surgeon: [name], MD;  Location: [place] [facility];  Service: Cardiovascular;  Laterality: N/A;  . TONSILLECTOMY      FAMILY HISTORY: family history includes Heart disease in her brother, father, and mother.  SOCIAL HISTORY:  reports that she quit smoking about 60 years ago. She has never used smokeless tobacco. She reports that she does not drink alcohol or use drugs.  ALLERGIES: Levaquin [levofloxacin in d5w], Cefuroxime axetil, and Doxycycline  MEDICATIONS:  Current Outpatient Medications  Medication Sig Dispense Refill  . b complex vitamins tablet Take 1 tablet by mouth daily.    Marland Kitchen dicyclomine (BENTYL) 10 MG capsule Take by mouth.    . gabapentin (NEURONTIN) 300 MG capsule Take by mouth.    . latanoprost (XALATAN) 0.005 % ophthalmic solution Place 1 drop into both eyes at bedtime.     . Magnesium 400 MG TABS Take  1 tablet by mouth daily.    . metFORMIN (GLUCOPHAGE) 500 MG tablet Take 250 mg by mouth daily with breakfast.     . metoprolol tartrate (LOPRESSOR) 25 MG tablet TAKE 1 TABLET BY MOUTH TWICE DAILY 180 tablet 0  . montelukast (SINGULAIR) 10 MG tablet One a day. 30 tablet 11  . pantoprazole (PROTONIX) 40 MG tablet TAKE 1  TABLET BY MOUTH TWICE DAILY    . pravastatin (PRAVACHOL) 40 MG tablet Take 1 tablet by mouth daily.    . traMADol (ULTRAM) 50 MG tablet Take 50 mg by mouth every 6 (six) hours as needed.    . lidocaine-prilocaine (EMLA) cream Apply cream 1 hour before chemotherapy treatment, place small amount of saran wrap over cream to protect clothing. (Patient not taking: Reported on 03/31/2019) 30 g 1  . ondansetron (ZOFRAN) 8 MG tablet One pill every 8 hours as needed for nausea/vomitting. (Patient not taking: Reported on 03/31/2019) 40 tablet 1  . prochlorperazine (COMPAZINE) 10 MG tablet Take 1 tablet (10 mg total) by mouth every 6 (six) hours as needed for nausea, vomiting or refractory nausea / vomiting. (Patient not taking: Reported on 03/31/2019) 40 tablet 0   No current facility-administered medications for this encounter.    Facility-Administered Medications Ordered in Other Encounters  Medication Dose Route Frequency Provider Last Rate Last Dose  . 0.9 %  sodium chloride infusion   Intravenous Continuous Cammie Sickle, MD 10 mL/hr at 12/05/16 1030    . 0.9 %  sodium chloride infusion   Intravenous Continuous Cammie Sickle, MD   Stopped at 12/26/16 1011  . 0.9 %  sodium chloride infusion   Intravenous Continuous Charlaine Dalton R, MD 10 mL/hr at 12/27/16 0930    . heparin lock flush 100 unit/mL  500 Units Intravenous Once Charlaine Dalton R, MD      . heparin lock flush 100 unit/mL  500 Units Intravenous Once Charlaine Dalton R, MD      . sodium chloride flush (NS) 0.9 % injection 10 mL  10 mL Intravenous PRN Cammie Sickle, MD   10 mL at 11/06/16 1000  . sodium chloride flush (NS) 0.9 % injection 10 mL  10 mL Intravenous PRN Cammie Sickle, MD   10 mL at 11/07/16 0905  . sodium chloride flush (NS) 0.9 % injection 10 mL  10 mL Intravenous PRN Charlaine Dalton R, MD      . sodium chloride flush (NS) 0.9 % injection 10 mL  10 mL Intravenous PRN Cammie Sickle, MD   10 mL at 12/05/16 1030  . sodium chloride flush (NS) 0.9 % injection 10 mL  10 mL Intravenous PRN Charlaine Dalton R, MD   10 mL at 12/27/16 0930    ECOG PERFORMANCE STATUS:  1 - Symptomatic but completely ambulatory  REVIEW OF SYSTEMS: Patient denies any weight loss, fatigue, weakness, fever, chills or night sweats. Patient denies any loss of vision, blurred vision. Patient denies any ringing  of the ears or hearing loss. No irregular heartbeat. Patient denies heart murmur or history of fainting. Patient denies any chest pain or pain radiating to her upper extremities. Patient denies any shortness of breath, difficulty breathing at night, cough or hemoptysis. Patient denies any swelling in the lower legs. Patient denies any nausea vomiting, vomiting of blood, or coffee ground material in the vomitus. Patient denies any stomach pain. Patient states has had normal bowel movements no significant constipation or diarrhea. Patient denies any dysuria,  hematuria or significant nocturia. Patient denies any problems walking, swelling in the joints or loss of balance. Patient denies any skin changes, loss of hair or loss of weight. Patient denies any excessive worrying or anxiety or significant depression. Patient denies any problems with insomnia. Patient denies excessive thirst, polyuria, polydipsia. Patient denies any swollen glands, patient denies easy bruising or easy bleeding. Patient denies any recent infections, allergies or URI. Patient "s visual fields have not changed significantly in recent time.   PHYSICAL EXAM: BP 121/66 (BP Location: Left Arm, Patient Position: Sitting)   Pulse 68   Temp 98.5 F (36.9 C) (Tympanic)   Resp 16   Wt 149 lb 4.8 oz (67.7 kg)   BMI 23.38 kg/m  Well-developed well-nourished patient in NAD. HEENT reveals PERLA, EOMI, discs not visualized.  Oral cavity is clear. No oral mucosal lesions are identified. Neck is clear without evidence of cervical or  supraclavicular adenopathy. Lungs are clear to A&P. Cardiac examination is essentially unremarkable with regular rate and rhythm without murmur rub or thrill. Abdomen is benign with no organomegaly or masses noted. Motor sensory and DTR levels are equal and symmetric in the upper and lower extremities. Cranial nerves II through XII are grossly intact. Proprioception is intact. No peripheral adenopathy or edema is identified. No motor or sensory levels are noted. Crude visual fields are within normal range.  LABORATORY DATA: Pathology report reviewed    RADIOLOGY RESULTS: CT scan reviewed PET CT scan has been ordered   IMPRESSION: Progressive and recurrent diffuse B-cell lymphoma of the pelvis and patient previously treated with involved field radiation therapy over 3 years out  PLAN: At this time I recommended a course of 4000 cGy to her mass.  I am going to a higher dose than I would use for involved field based on extremely large nature of her lesion.  I also have to go relatively slowly with her treatments be based on bowel toxicity.  We will try to get a PET CT scan although I have been a form that has been declined by Medicare.  Risks and benefits of treatment including increased lower urinary tract symptoms diarrhea fatigue alteration of blood counts all were discussed in detail with the patient.  I have personally set up and ordered CT simulation.  Patient comprehends my treatment plan well.  Case was discussed personally with medical oncology.  I would like to take this opportunity to thank you for allowing me to participate in the care of your patient.Noreene Filbert, MD

## 2019-04-06 NOTE — Telephone Encounter (Signed)
x

## 2019-04-07 NOTE — Addendum Note (Signed)
Addended by: Sabino Gasser on: 04/07/2019 08:47 AM   Modules accepted: Orders

## 2019-04-08 ENCOUNTER — Other Ambulatory Visit: Payer: Self-pay | Admitting: Internal Medicine

## 2019-04-08 ENCOUNTER — Ambulatory Visit: Payer: Medicare Other

## 2019-04-08 ENCOUNTER — Telehealth: Payer: Self-pay | Admitting: *Deleted

## 2019-04-08 MED ORDER — PREDNISONE 20 MG PO TABS: ORAL_TABLET | ORAL | 0 refills | Status: DC

## 2019-04-08 NOTE — Telephone Encounter (Signed)
Per patient call, steroids were to be sent to pharmacy for her yesterday and the pharmacy has not received prescription from Dr Rogue Bussing as of yet.

## 2019-04-09 ENCOUNTER — Other Ambulatory Visit: Payer: Self-pay

## 2019-04-09 ENCOUNTER — Ambulatory Visit
Admission: RE | Admit: 2019-04-09 | Discharge: 2019-04-09 | Disposition: A | Discharge status: Home / Self Care | Payer: Medicare Other | Source: Ambulatory Visit | Attending: Internal Medicine | Admitting: Internal Medicine

## 2019-04-09 DIAGNOSIS — Z5181 Encounter for therapeutic drug level monitoring: Secondary | ICD-10-CM | POA: Diagnosis present

## 2019-04-09 DIAGNOSIS — J849 Interstitial pulmonary disease, unspecified: Secondary | ICD-10-CM | POA: Diagnosis not present

## 2019-04-09 DIAGNOSIS — I059 Rheumatic mitral valve disease, unspecified: Secondary | ICD-10-CM | POA: Diagnosis not present

## 2019-04-09 DIAGNOSIS — Z79899 Other long term (current) drug therapy: Secondary | ICD-10-CM | POA: Diagnosis not present

## 2019-04-09 DIAGNOSIS — I119 Hypertensive heart disease without heart failure: Secondary | ICD-10-CM | POA: Diagnosis not present

## 2019-04-09 DIAGNOSIS — E119 Type 2 diabetes mellitus without complications: Secondary | ICD-10-CM | POA: Insufficient documentation

## 2019-04-09 NOTE — Progress Notes (Signed)
*  PRELIMINARY RESULTS* Echocardiogram 2D Echocardiogram has been performed.  [place] 04/09/2019, 10:52 AM

## 2019-04-10 ENCOUNTER — Other Ambulatory Visit: Payer: Self-pay

## 2019-04-13 ENCOUNTER — Inpatient Hospital Stay: Payer: Medicare Other

## 2019-04-13 ENCOUNTER — Inpatient Hospital Stay (HOSPITAL_BASED_OUTPATIENT_CLINIC_OR_DEPARTMENT_OTHER): Payer: Medicare Other | Admitting: Internal Medicine

## 2019-04-13 ENCOUNTER — Other Ambulatory Visit: Payer: Self-pay

## 2019-04-13 ENCOUNTER — Encounter: Payer: Self-pay | Admitting: Internal Medicine

## 2019-04-13 DIAGNOSIS — C8333 Diffuse large B-cell lymphoma, intra-abdominal lymph nodes: Secondary | ICD-10-CM

## 2019-04-13 DIAGNOSIS — Z95828 Presence of other vascular implants and grafts: Secondary | ICD-10-CM

## 2019-04-13 DIAGNOSIS — Z5112 Encounter for antineoplastic immunotherapy: Secondary | ICD-10-CM | POA: Diagnosis not present

## 2019-04-13 LAB — BASIC METABOLIC PANEL
Anion gap: 13 (ref 5–15)
BUN: 15 mg/dL (ref 8–23)
CO2: 24 mmol/L (ref 22–32)
Calcium: 9.4 mg/dL (ref 8.9–10.3)
Chloride: 102 mmol/L (ref 98–111)
Creatinine, Ser: 0.54 mg/dL (ref 0.44–1.00)
GFR calc Af Amer: >60 mL/min (ref >60)
GFR calc non Af Amer: >60 mL/min (ref >60)
Glucose, Bld: 247 mg/dL — ABNORMAL HIGH (ref 70–99)
Potassium: 3.3 mmol/L — ABNORMAL LOW (ref 3.5–5.1)
Sodium: 139 mmol/L (ref 135–145)

## 2019-04-13 LAB — CBC WITH DIFFERENTIAL/PLATELET
Abs Immature Granulocytes: 0.11 10*3/uL — ABNORMAL HIGH (ref 0.00–0.07)
Basophils Absolute: 0 10*3/uL (ref 0.0–0.1)
Basophils Relative: 0 %
Eosinophils Absolute: 0 10*3/uL (ref 0.0–0.5)
Eosinophils Relative: 0 %
HCT: 38.5 % (ref 36.0–46.0)
Hemoglobin: 12.8 g/dL (ref 12.0–15.0)
Immature Granulocytes: 1 %
Lymphocytes Relative: 11 %
Lymphs Abs: 1 10*3/uL (ref 0.7–4.0)
MCH: 30.5 pg (ref 26.0–34.0)
MCHC: 33.2 g/dL (ref 30.0–36.0)
MCV: 91.9 fL (ref 80.0–100.0)
Monocytes Absolute: 0.8 10*3/uL (ref 0.1–1.0)
Monocytes Relative: 9 %
Neutro Abs: 6.9 10*3/uL (ref 1.7–7.7)
Neutrophils Relative %: 79 %
Platelets: 216 10*3/uL (ref 150–400)
RBC: 4.19 MIL/uL (ref 3.87–5.11)
RDW: 12.6 % (ref 11.5–15.5)
WBC: 8.8 10*3/uL (ref 4.0–10.5)
nRBC: 0 % (ref 0.0–0.2)

## 2019-04-13 LAB — LACTATE DEHYDROGENASE: LDH: 161 U/L (ref 98–192)

## 2019-04-13 MED ORDER — SODIUM CHLORIDE 0.9 % IV SOLN
Freq: Once | INTRAVENOUS | Status: AC
  Administered 2019-04-13: 09:00:00 via INTRAVENOUS
  Filled 2019-04-13: qty 250

## 2019-04-13 MED ORDER — HEPARIN SOD (PORK) LOCK FLUSH 100 UNIT/ML IV SOLN
500.0000 [IU] | Freq: Once | INTRAVENOUS | Status: AC
  Administered 2019-04-13: 500 [IU] via INTRAVENOUS
  Filled 2019-04-13: qty 5

## 2019-04-13 MED ORDER — SODIUM CHLORIDE 0.9 % IV SOLN
Freq: Once | INTRAVENOUS | Status: AC
  Administered 2019-04-13: 10:00:00 via INTRAVENOUS
  Filled 2019-04-13: qty 250

## 2019-04-13 MED ORDER — ACETAMINOPHEN 325 MG PO TABS
650.0000 mg | ORAL_TABLET | Freq: Once | ORAL | Status: AC
  Administered 2019-04-13: 650 mg via ORAL
  Filled 2019-04-13: qty 2

## 2019-04-13 MED ORDER — SODIUM CHLORIDE 0.9% FLUSH
10.0000 mL | Freq: Once | INTRAVENOUS | Status: AC
  Administered 2019-04-13: 10 mL via INTRAVENOUS
  Filled 2019-04-13: qty 10

## 2019-04-13 MED ORDER — DIPHENHYDRAMINE HCL 25 MG PO CAPS
50.0000 mg | ORAL_CAPSULE | Freq: Once | ORAL | Status: AC
  Administered 2019-04-13: 09:00:00 50 mg via ORAL
  Filled 2019-04-13: qty 2

## 2019-04-13 NOTE — Progress Notes (Signed)
[facility] OFFICE PROGRESS NOTE  Patient Care Team: Rusty Aus, [name]  Cancer Staging No matching staging information was found for the patient.   Oncology History Overview Note  DEC 2017- DLBCL "TRIPLE HIT [myc/ bcl-2/bcl-6 gene rearrangement FISH]" ~10 cm mass RP LN; STAGE II [jan 2018- BMBx-NEG]; Jan 8th R-CHOP;   # JAN 29th 2018- DA-R EPOCH x5 ; PET CR; s/p RT [last 03/18/2017]  # FEB-MARCH 2019- RECURRENCE of DLBCL [s/p RP Bx; 4 cm mass inferior to Left Kidney] ; II [facility]; Dr.Grover.   # April 3rd 2019- R-GDP [carbo]; MAY 2019- PET PR; NOV 2019- PR  #August 2020-recurrence bulky left pelvis/retroperitoneal [9-10 cm]; no biopsy. #August 24th-Rituxan weekly cycle #1; prednisone 60 mg a day.   #August 2020-2D echo ejection fraction 60 to 65%  --------------------------------  # JAN 26th-LP  # Interstitial Lung disease [surveillance] --------------------------------------------------------------------------  DIAGNOSIS: [name] 2019 ] RECURRENT DLBCL  STAGE:   Recurrent ; GOALS: CURATIVE  CURRENT/MOST RECENT THERAPY: surveillaince   Diffuse large B-cell lymphoma of intra-abdominal lymph nodes ([name])  04/13/2019 -  Chemotherapy   The patient had [No matching medication found in this treatment plan]  for chemotherapy treatment.        INTERVAL HISTORY:  [place] 73 y.o.  female pleasant patient above history of recurrent diffuse large B-cell lymphoma based on imaging is here for follow-up.  Patient's PET scan was declined by insurance.  In the interim patient was evaluated by radiation oncology-regarding starting radiation.  Awaiting simulation this week.  Patient has also appointment at [name] Dr. Thera Flake this week.  Patient was started on prednisone 60 mg a day 3 days ago.  Patient noted to have improvement of her pain; she seems more comfortable.    Review of Systems  Constitutional: Positive for  malaise/fatigue. Negative for chills, diaphoresis, fever and weight loss.  HENT: Negative for nosebleeds and sore throat.   Eyes: Negative for double vision.  Respiratory: Negative for cough, hemoptysis, sputum production, shortness of breath and wheezing.   Cardiovascular: Negative for chest pain, palpitations, orthopnea and leg swelling.  Gastrointestinal: Negative for abdominal pain, blood in stool, constipation, diarrhea, heartburn, melena and vomiting.  Genitourinary: Negative for dysuria, frequency and urgency.  Musculoskeletal: Positive for back pain and joint pain.  Skin: Negative.  Negative for itching and rash.  Neurological: Positive for tingling. Negative for focal weakness and weakness.  Endo/Heme/Allergies: Does not bruise/bleed easily.  Psychiatric/Behavioral: Negative for depression. The patient is not nervous/anxious and does not have insomnia.       PAST MEDICAL HISTORY :  Past Medical History:  Diagnosis Date  . Diabetes mellitus without complication ([place])   . Diffuse large B-cell lymphoma ([facility])    Chemo + rad tx's  . Heart murmur   . History of chemotherapy 07/08/2017  . History of gastric ulcer   . History of radiation therapy 07/08/2017  . Hypercholesterolemia   . Hypertension   . ILD (interstitial lung disease) ([name])    8 yrs ago  . Lymphadenopathy     PAST SURGICAL HISTORY :   Past Surgical History:  Procedure Laterality Date  . BREAST BIOPSY Left 2010   neg  . CATARACT EXTRACTION W/PHACO Left 10/16/2017   Procedure: CATARACT EXTRACTION PHACO AND INTRAOCULAR LENS PLACEMENT ([place]) COMPLICATED DIABETIC LEFT;  Surgeon: Leandrew Koyanagi, MD;  Location: [place];  Service: Ophthalmology;  Laterality: Left;  IRIS HOOKS Diabetic - oral meds  .  HERNIA REPAIR    . PARTIAL HYSTERECTOMY     age 82  . PERIPHERAL VASCULAR CATHETERIZATION N/A 08/22/2016   Procedure: [name];  Surgeon: [name], MD;  Location: [place] [facility];   Service: Cardiovascular;  Laterality: N/A;  . TONSILLECTOMY      FAMILY HISTORY :   Family History  Problem Relation Age of Onset  . Heart disease Mother   . Heart disease Father   . Heart disease Brother     SOCIAL HISTORY:   Social History   Tobacco Use  . Smoking status: Former Smoker    Quit date: 08/21/1958    Years since quitting: 60.6  . Smokeless tobacco: Never Used  Substance Use Topics  . Alcohol use: No  . Drug use: No    ALLERGIES:  is allergic to levaquin [levofloxacin in d5w]; cefuroxime axetil; and doxycycline.  MEDICATIONS:  Current Outpatient Medications  Medication Sig Dispense Refill  . b complex vitamins tablet Take 1 tablet by mouth daily.    Marland Kitchen dicyclomine (BENTYL) 10 MG capsule Take by mouth.    . gabapentin (NEURONTIN) 300 MG capsule Take by mouth.    . latanoprost (XALATAN) 0.005 % ophthalmic solution Place 1 drop into both eyes at bedtime.     . lidocaine-prilocaine (EMLA) cream Apply cream 1 hour before chemotherapy treatment, place small amount of saran wrap over cream to protect clothing. 30 g 1  . Magnesium 400 MG TABS Take 1 tablet by mouth daily.    . metFORMIN (GLUCOPHAGE) 500 MG tablet Take 250 mg by mouth daily with breakfast.     . metoprolol tartrate (LOPRESSOR) 25 MG tablet TAKE 1 TABLET BY MOUTH TWICE DAILY 180 tablet 0  . montelukast (SINGULAIR) 10 MG tablet One a day. 30 tablet 11  . ondansetron (ZOFRAN) 8 MG tablet One pill every 8 hours as needed for nausea/vomitting. 40 tablet 1  . pantoprazole (PROTONIX) 40 MG tablet TAKE 1 TABLET BY MOUTH TWICE DAILY    . pravastatin (PRAVACHOL) 40 MG tablet Take 1 tablet by mouth daily.    . predniSONE (DELTASONE) 20 MG tablet Take 3 pills a day x 10 days. 60 tablet 0  . prochlorperazine (COMPAZINE) 10 MG tablet Take 1 tablet (10 mg total) by mouth every 6 (six) hours as needed for nausea, vomiting or refractory nausea / vomiting. 40 tablet 0  . traMADol (ULTRAM) 50 MG tablet Take 50 mg by mouth  every 6 (six) hours as needed.     No current facility-administered medications for this visit.    Facility-Administered Medications Ordered in Other Visits  Medication Dose Route Frequency Provider Last Rate Last Dose  . 0.9 %  sodium chloride infusion   Intravenous Continuous Cammie Sickle, MD 10 mL/hr at 12/05/16 1030    . 0.9 %  sodium chloride infusion   Intravenous Continuous Cammie Sickle, MD   Stopped at 12/26/16 1011  . 0.9 %  sodium chloride infusion   Intravenous Continuous Charlaine Dalton R, MD 10 mL/hr at 12/27/16 0930    . heparin lock flush 100 unit/mL  500 Units Intravenous Once Charlaine Dalton R, MD      . heparin lock flush 100 unit/mL  500 Units Intravenous Once Charlaine Dalton R, MD      . sodium chloride flush (NS) 0.9 % injection 10 mL  10 mL Intravenous PRN Cammie Sickle, MD   10 mL at 11/06/16 1000  . sodium chloride flush (NS)  0.9 % injection 10 mL  10 mL Intravenous PRN Cammie Sickle, MD   10 mL at 11/07/16 0905  . sodium chloride flush (NS) 0.9 % injection 10 mL  10 mL Intravenous PRN Charlaine Dalton R, MD      . sodium chloride flush (NS) 0.9 % injection 10 mL  10 mL Intravenous PRN Cammie Sickle, MD   10 mL at 12/05/16 1030  . sodium chloride flush (NS) 0.9 % injection 10 mL  10 mL Intravenous PRN Cammie Sickle, MD   10 mL at 12/27/16 0930    PHYSICAL EXAMINATION: ECOG PERFORMANCE STATUS: 1 - Symptomatic but completely ambulatory  BP 136/72 (BP Location: Left Arm, Patient Position: Sitting, Cuff Size: Normal)   Pulse 64   Temp (!) 97.3 F (36.3 C) (Tympanic)   Resp 16   Wt 149 lb 9.6 oz (67.9 kg)   BMI 23.43 kg/m   Filed Weights   04/13/19 0837  Weight: 149 lb 9.6 oz (67.9 kg)    Physical Exam  Constitutional: She is oriented to person, place, and time and well-developed, well-nourished, and in no distress.  She is alone.  Walking independently.  HENT:  Head: Normocephalic and  atraumatic.  Mouth/Throat: Oropharynx is clear and moist. No oropharyngeal exudate.  Eyes: Pupils are equal, round, and reactive to light.  Neck: Normal range of motion. Neck supple.  Cardiovascular: Normal rate and regular rhythm.  Pulmonary/Chest: No respiratory distress. She has no wheezes.  Positive for crackles bilateral lung fields at the bases.  Abdominal: Soft. Bowel sounds are normal. She exhibits no distension and no mass. There is no abdominal tenderness. There is no rebound and no guarding.  Musculoskeletal: Normal range of motion.        General: No tenderness or edema.     Comments: Approximately 4 x 3 cm mass noted posterior left flank.  Neurological: She is alert and oriented to person, place, and time.  Skin: Skin is warm.  Psychiatric: Affect normal.   LABORATORY DATA:  I have reviewed the data as listed    Component Value Date/Time   NA 139 04/13/2019 0814   K 3.3 (L) 04/13/2019 0814   K 4.1 07/01/2014 1429   CL 102 04/13/2019 0814   CO2 24 04/13/2019 0814   GLUCOSE 247 (H) 04/13/2019 0814   BUN 15 04/13/2019 0814   CREATININE 0.54 04/13/2019 0814   CALCIUM 9.4 04/13/2019 0814   PROT 6.6 03/31/2019 1010   ALBUMIN 4.0 03/31/2019 1010   AST 42 (H) 03/31/2019 1010   ALT 41 03/31/2019 1010   ALKPHOS 77 03/31/2019 1010   BILITOT 0.5 03/31/2019 1010   GFRNONAA >60 04/13/2019 0814   GFRAA >60 04/13/2019 0814    No results found for: SPEP, UPEP  Lab Results  Component Value Date   WBC 8.8 04/13/2019   NEUTROABS 6.9 04/13/2019   HGB 12.8 04/13/2019   HCT 38.5 04/13/2019   MCV 91.9 04/13/2019   PLT 216 04/13/2019      Chemistry      Component Value Date/Time   NA 139 04/13/2019 0814   K 3.3 (L) 04/13/2019 0814   K 4.1 07/01/2014 1429   CL 102 04/13/2019 0814   CO2 24 04/13/2019 0814   BUN 15 04/13/2019 0814   CREATININE 0.54 04/13/2019 0814      Component Value Date/Time   CALCIUM 9.4 04/13/2019 0814   ALKPHOS 77 03/31/2019 1010   AST 42 (H)  03/31/2019 1010  ALT 41 03/31/2019 1010   BILITOT 0.5 03/31/2019 1010       RADIOGRAPHIC STUDIES: I have personally reviewed the radiological images as listed and agreed with the findings in the report. No results found.   ASSESSMENT & PLAN:  Diffuse large B-cell lymphoma of intra-abdominal lymph nodes ([place]) # RECURRENT DLBCL-  triple hit diffuse large B-cell lymphoma [ March 2019]; NO Biopsy. March 30, 2019-significant worsening of the disease in the left pararenal space/retroperitoneal [9-10 centimeters mass]; approximately 3 to 4 cm centimeter satellite nodule in the subcutaneous fat.  # Proceed with Rituxan weekly; #1 today. On prednisone 60 mg/day x10 days. Awaiting to start RT. Also discussed regarding POLA-Rituxan-Benda every 3 weeks; however hold for now given with stem cell collection/potential CAR-T with bendamustine.  Will discuss with Dr. Leilani Able; regarding the timing of starting chemotherapy.  # Chronic mild nausea/intermittent headaches. Prn zofran.  STABLE.   #Elevated blood sugars 247-secondary to steroids prednisone.  Recommend frequent monitoring of blood glucose and if elevated to inform us.  #Peripheral neuropathy/left foot drop-  STABLE.    # ILD-clinically stable.  # DISPOSITION: # Rituxan today # follow up in 1 week- MD; labs- cbc/bmp/LDH; Rituxan- Dr.B   Orders Placed This Encounter  Procedures  . CBC with Differential    Standing Status:   Future    Standing Expiration Date:   04/12/2020  . Basic metabolic panel    Standing Status:   Future    Standing Expiration Date:   04/12/2020  . Lactate dehydrogenase    Standing Status:   Future    Standing Expiration Date:   04/12/2020   All questions were answered. The patient knows to call the clinic with any problems, questions or concerns.      Cammie Sickle, MD 04/13/2019 9:07 AM

## 2019-04-13 NOTE — Assessment & Plan Note (Addendum)
#   RECURRENT DLBCL-  triple hit diffuse large B-cell lymphoma [ March 2019]; NO Biopsy. March 30, 2019-significant worsening of the disease in the left pararenal space/retroperitoneal [9-10 centimeters mass]; approximately 3 to 4 cm centimeter satellite nodule in the subcutaneous fat.  # Proceed with Rituxan weekly; #1 today. On prednisone 60 mg/day x10 days. Awaiting to start RT. Also discussed regarding POLA-Rituxan-Benda every 3 weeks; however hold for now given with stem cell collection/potential CAR-T with bendamustine.  Will discuss with Dr. Leilani Able; regarding the timing of starting chemotherapy.  # Chronic mild nausea/intermittent headaches. Prn zofran.  STABLE.   #Elevated blood sugars 247-secondary to steroids prednisone.  Recommend frequent monitoring of blood glucose and if elevated to inform us.  #Peripheral neuropathy/left foot drop-  STABLE.    # ILD-clinically stable.  # DISPOSITION: # Rituxan today # follow up in 1 week- MD; labs- cbc/bmp/LDH; Rituxan- Dr.B

## 2019-04-14 ENCOUNTER — Ambulatory Visit
Admission: RE | Admit: 2019-04-14 | Discharge: 2019-04-14 | Disposition: A | Discharge status: Home / Self Care | Payer: Medicare Other | Source: Ambulatory Visit | Attending: Radiation Oncology | Admitting: Radiation Oncology

## 2019-04-14 ENCOUNTER — Other Ambulatory Visit: Payer: Self-pay

## 2019-04-14 DIAGNOSIS — Z87891 Personal history of nicotine dependence: Secondary | ICD-10-CM | POA: Insufficient documentation

## 2019-04-14 DIAGNOSIS — C8333 Diffuse large B-cell lymphoma, intra-abdominal lymph nodes: Secondary | ICD-10-CM | POA: Insufficient documentation

## 2019-04-14 DIAGNOSIS — Z510 Encounter for antineoplastic radiation therapy: Secondary | ICD-10-CM | POA: Diagnosis not present

## 2019-04-15 DIAGNOSIS — C8333 Diffuse large B-cell lymphoma, intra-abdominal lymph nodes: Secondary | ICD-10-CM | POA: Diagnosis not present

## 2019-04-17 ENCOUNTER — Encounter: Payer: Self-pay | Admitting: Internal Medicine

## 2019-04-17 ENCOUNTER — Other Ambulatory Visit: Payer: Self-pay

## 2019-04-17 NOTE — Progress Notes (Signed)
Patient stated that she had been doing well with no complaints. 

## 2019-04-20 ENCOUNTER — Inpatient Hospital Stay: Payer: Medicare Other

## 2019-04-20 ENCOUNTER — Other Ambulatory Visit: Payer: Self-pay

## 2019-04-20 ENCOUNTER — Inpatient Hospital Stay (HOSPITAL_BASED_OUTPATIENT_CLINIC_OR_DEPARTMENT_OTHER): Payer: Medicare Other | Admitting: Internal Medicine

## 2019-04-20 ENCOUNTER — Encounter: Payer: Self-pay | Admitting: Internal Medicine

## 2019-04-20 DIAGNOSIS — C8333 Diffuse large B-cell lymphoma, intra-abdominal lymph nodes: Secondary | ICD-10-CM

## 2019-04-20 DIAGNOSIS — Z5112 Encounter for antineoplastic immunotherapy: Secondary | ICD-10-CM | POA: Diagnosis not present

## 2019-04-20 LAB — CBC WITH DIFFERENTIAL/PLATELET
Abs Immature Granulocytes: 0.05 10*3/uL (ref 0.00–0.07)
Basophils Absolute: 0 10*3/uL (ref 0.0–0.1)
Basophils Relative: 0 %
Eosinophils Absolute: 0.3 10*3/uL (ref 0.0–0.5)
Eosinophils Relative: 4 %
HCT: 38.6 % (ref 36.0–46.0)
Hemoglobin: 12.8 g/dL (ref 12.0–15.0)
Immature Granulocytes: 1 %
Lymphocytes Relative: 12 %
Lymphs Abs: 0.9 10*3/uL (ref 0.7–4.0)
MCH: 30.7 pg (ref 26.0–34.0)
MCHC: 33.2 g/dL (ref 30.0–36.0)
MCV: 92.6 fL (ref 80.0–100.0)
Monocytes Absolute: 0.8 10*3/uL (ref 0.1–1.0)
Monocytes Relative: 11 %
Neutro Abs: 5.6 10*3/uL (ref 1.7–7.7)
Neutrophils Relative %: 72 %
Platelets: 174 10*3/uL (ref 150–400)
RBC: 4.17 MIL/uL (ref 3.87–5.11)
RDW: 12.9 % (ref 11.5–15.5)
WBC: 7.6 10*3/uL (ref 4.0–10.5)
nRBC: 0 % (ref 0.0–0.2)

## 2019-04-20 LAB — BASIC METABOLIC PANEL
Anion gap: 7 (ref 5–15)
BUN: 16 mg/dL (ref 8–23)
CO2: 27 mmol/L (ref 22–32)
Calcium: 8.7 mg/dL — ABNORMAL LOW (ref 8.9–10.3)
Chloride: 104 mmol/L (ref 98–111)
Creatinine, Ser: 0.39 mg/dL — ABNORMAL LOW (ref 0.44–1.00)
GFR calc Af Amer: >60 mL/min (ref >60)
GFR calc non Af Amer: >60 mL/min (ref >60)
Glucose, Bld: 138 mg/dL — ABNORMAL HIGH (ref 70–99)
Potassium: 3.5 mmol/L (ref 3.5–5.1)
Sodium: 138 mmol/L (ref 135–145)

## 2019-04-20 LAB — LACTATE DEHYDROGENASE: LDH: 142 U/L (ref 98–192)

## 2019-04-20 MED ORDER — HEPARIN SOD (PORK) LOCK FLUSH 100 UNIT/ML IV SOLN
500.0000 [IU] | Freq: Once | INTRAVENOUS | Status: AC
  Administered 2019-04-20: 500 [IU] via INTRAVENOUS

## 2019-04-20 MED ORDER — HEPARIN SOD (PORK) LOCK FLUSH 100 UNIT/ML IV SOLN
INTRAVENOUS | Status: AC
  Filled 2019-04-20: qty 5

## 2019-04-20 NOTE — Assessment & Plan Note (Addendum)
#   RECURRENT DLBCL-  triple hit diffuse large B-cell lymphoma [ March 2019]; NO Biopsy. March 30, 2019-significant worsening of the disease in the left pararenal space/retroperitoneal [9-10 centimeters mass]; approximately 3 to 4 cm centimeter satellite nodule in the subcutaneous fat.   # S/p Rituxa 1 week ago; and prednisone x 1 week; Improved-improvement of the left flank mass-currently 2 to 3 cm in size.  # HOLD off Rituxan-Prednisone at this time [for possible leukapheresis]. Discussed with Dr. Donella Stade regarding the timing of starting RT. we will proceed with simulation tomorrow; but hold off starting radiation until notification from [facility] regarding leukapheresis to date [hopefully this week]  # Elevated blood sugars 247-secondary to steroids prednisone; improved off steroids.   #Peripheral neuropathy/left foot drop-  STABLE.    # ILD-clinically stable.  # DISPOSITION: # HOLD Rituxan today; De-ACCESS # follow up in 2 week- MD; labs- cbc/bmp/LDH;- Dr.B

## 2019-04-20 NOTE — Progress Notes (Signed)
[facility] OFFICE PROGRESS NOTE  Patient Care Team: Rusty Aus, [name]  Cancer Staging No matching staging information was found for the patient.   Oncology History Overview Note  DEC 2017- DLBCL "TRIPLE HIT [myc/ bcl-2/bcl-6 gene rearrangement FISH]" ~10 cm mass RP LN; STAGE II [jan 2018- BMBx-NEG]; Jan 8th R-[facility];   # JAN 29th 2018- DA-R EPOCH x5 ; PET CR; s/p RT [last 03/18/2017]  # FEB-MARCH 2019- RECURRENCE of DLBCL [s/p RP Bx; 4 cm mass inferior to Left Kidney] ; II [facility]; Dr.Grover.   # April 3rd 2019- R-GDP [carbo]; MAY 2019- PET PR; NOV 2019- PR  #August 2020-recurrence bulky left pelvis/retroperitoneal [9-10 cm]; no biopsy. #August 24th-Rituxan weekly cycle #1; prednisone 60 mg a day.   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        INTERVAL HISTORY:  [place] 73 y.o.  female pleasant patient above history of recurrent diffuse large B-cell lymphoma based on imaging is here for follow-up.  Patient received Rituxan x1 infusion; prednisone 60 mg a day times 1 week.  Patient was evaluated [facility] for [facility] therapy.  Patient felt to a candidate for CarT.  Awaiting insurance approval.  Patient also improvement of the hip pain.  Mild pain; more tolerable.  Review of Systems  Constitutional: Positive for malaise/fatigue. Negative for chills, diaphoresis, fever and weight loss.  HENT: Negative for nosebleeds and sore  throat.   Eyes: Negative for double vision.  Respiratory: Negative for cough, hemoptysis, sputum production, shortness of breath and wheezing.   Cardiovascular: Negative for chest pain, palpitations, orthopnea and leg swelling.  Gastrointestinal: Negative for abdominal pain, blood in stool, constipation, diarrhea, heartburn, melena and vomiting.  Genitourinary: Negative for dysuria, frequency and urgency.  Musculoskeletal: Positive for back pain and joint pain.  Skin: Negative.  Negative for itching and rash.  Neurological: Positive for tingling. Negative for focal weakness and weakness.  Endo/Heme/Allergies: Does not bruise/bleed easily.  Psychiatric/Behavioral: Negative for depression. The patient is not nervous/anxious and does not have insomnia.       PAST MEDICAL HISTORY :  Past Medical History:  Diagnosis Date  . Diabetes mellitus without complication ([place])   . Diffuse large B-cell lymphoma ([facility])    Chemo + rad tx's  . Heart murmur   . History of chemotherapy 07/08/2017  . History of gastric ulcer   . History of radiation therapy 07/08/2017  . Hypercholesterolemia   . Hypertension   . ILD (interstitial lung disease) ([place])    8 yrs ago  . Lymphadenopathy     PAST SURGICAL HISTORY :   Past Surgical History:  Procedure Laterality Date  . BREAST BIOPSY Left 2010   neg  . CATARACT EXTRACTION W/PHACO Left 10/16/2017   Procedure: CATARACT EXTRACTION PHACO AND INTRAOCULAR LENS PLACEMENT ([place]) COMPLICATED DIABETIC LEFT;  Surgeon: Leandrew Koyanagi, MD;  Location: [facility];  Service: Ophthalmology;  Laterality: Left;  IRIS HOOKS Diabetic - oral meds  . HERNIA REPAIR    . PARTIAL HYSTERECTOMY     age 66  .  PERIPHERAL VASCULAR CATHETERIZATION N/A 08/22/2016   Procedure: [name];  Surgeon: [name], MD;  Location: [place] [facility];  Service: Cardiovascular;  Laterality: N/A;  . TONSILLECTOMY      FAMILY HISTORY :   Family History  Problem  Relation Age of Onset  . Heart disease Mother   . Heart disease Father   . Heart disease Brother     SOCIAL HISTORY:   Social History   Tobacco Use  . Smoking status: Former Smoker    Quit date: 08/21/1958    Years since quitting: 60.7  . Smokeless tobacco: Never Used  Substance Use Topics  . Alcohol use: No  . Drug use: No    ALLERGIES:  is allergic to levaquin [levofloxacin in d5w]; cefuroxime axetil; and doxycycline.  MEDICATIONS:  Current Outpatient Medications  Medication Sig Dispense Refill  . b complex vitamins tablet Take 1 tablet by mouth daily.    Marland Kitchen dicyclomine (BENTYL) 10 MG capsule Take by mouth.    . gabapentin (NEURONTIN) 300 MG capsule Take by mouth.    . latanoprost (XALATAN) 0.005 % ophthalmic solution Place 1 drop into both eyes at bedtime.     . lidocaine-prilocaine (EMLA) cream Apply cream 1 hour before chemotherapy treatment, place small amount of saran wrap over cream to protect clothing. 30 g 1  . Magnesium 400 MG TABS Take 1 tablet by mouth daily.    . metFORMIN (GLUCOPHAGE) 500 MG tablet Take 250 mg by mouth daily with breakfast.     . metoprolol tartrate (LOPRESSOR) 25 MG tablet TAKE 1 TABLET BY MOUTH TWICE DAILY 180 tablet 0  . montelukast (SINGULAIR) 10 MG tablet One a day. 30 tablet 11  . ondansetron (ZOFRAN) 8 MG tablet One pill every 8 hours as needed for nausea/vomitting. 40 tablet 1  . pantoprazole (PROTONIX) 40 MG tablet TAKE 1 TABLET BY MOUTH TWICE DAILY    . pravastatin (PRAVACHOL) 40 MG tablet Take 1 tablet by mouth daily.    . prochlorperazine (COMPAZINE) 10 MG tablet Take 1 tablet (10 mg total) by mouth every 6 (six) hours as needed for nausea, vomiting or refractory nausea / vomiting. 40 tablet 0  . traMADol (ULTRAM) 50 MG tablet Take 50 mg by mouth every 6 (six) hours as needed.     No current facility-administered medications for this visit.    Facility-Administered Medications Ordered in Other Visits  Medication Dose Route Frequency  Provider Last Rate Last Dose  . 0.9 %  sodium chloride infusion   Intravenous Continuous Cammie Sickle, MD 10 mL/hr at 12/05/16 1030    . 0.9 %  sodium chloride infusion   Intravenous Continuous Cammie Sickle, MD   Stopped at 12/26/16 1011  . 0.9 %  sodium chloride infusion   Intravenous Continuous Charlaine Dalton R, MD 10 mL/hr at 12/27/16 0930    . heparin lock flush 100 unit/mL  500 Units Intravenous Once Charlaine Dalton R, MD      . heparin lock flush 100 unit/mL  500 Units Intravenous Once Charlaine Dalton R, MD      . sodium chloride flush (NS) 0.9 % injection 10 mL  10 mL Intravenous PRN Cammie Sickle, MD   10 mL at 11/06/16 1000  . sodium chloride flush (NS) 0.9 % injection 10 mL  10 mL Intravenous PRN Cammie Sickle, MD   10 mL at 11/07/16 0905  . sodium chloride flush (NS) 0.9 % injection 10 mL  10  mL Intravenous PRN Charlaine Dalton R, MD      . sodium chloride flush (NS) 0.9 % injection 10 mL  10 mL Intravenous PRN Cammie Sickle, MD   10 mL at 12/05/16 1030  . sodium chloride flush (NS) 0.9 % injection 10 mL  10 mL Intravenous PRN Cammie Sickle, MD   10 mL at 12/27/16 0930    PHYSICAL EXAMINATION: ECOG PERFORMANCE STATUS: 1 - Symptomatic but completely ambulatory  BP (!) 116/59 (BP Location: Left Arm, Patient Position: Sitting, Cuff Size: Normal)   Pulse 69   Temp 98.7 F (37.1 C) (Tympanic)   Resp 16   Wt 148 lb (67.1 kg)   BMI 23.18 kg/m   Filed Weights   04/17/19 1609  Weight: 148 lb (67.1 kg)    Physical Exam  Constitutional: She is oriented to person, place, and time and well-developed, well-nourished, and in no distress.  She is alone.  Walking independently.  HENT:  Head: Normocephalic and atraumatic.  Mouth/Throat: Oropharynx is clear and moist. No oropharyngeal exudate.  Eyes: Pupils are equal, round, and reactive to light.  Neck: Normal range of motion. Neck supple.  Cardiovascular: Normal rate  and regular rhythm.  Pulmonary/Chest: No respiratory distress. She has no wheezes.  Positive for crackles bilateral lung fields at the bases.  Abdominal: Soft. Bowel sounds are normal. She exhibits no distension and no mass. There is no abdominal tenderness. There is no rebound and no guarding.  Musculoskeletal: Normal range of motion.        General: No tenderness or edema.     Comments: Approximately 2 to 3 cm mass noted posterior left flank.  Neurological: She is alert and oriented to person, place, and time.  Skin: Skin is warm.  Psychiatric: Affect normal.   LABORATORY DATA:  I have reviewed the data as listed    Component Value Date/Time   NA 138 04/20/2019 0812   K 3.5 04/20/2019 0812   K 4.1 07/01/2014 1429   CL 104 04/20/2019 0812   CO2 27 04/20/2019 0812   GLUCOSE 138 (H) 04/20/2019 0812   BUN 16 04/20/2019 0812   CREATININE 0.39 (L) 04/20/2019 0812   CALCIUM 8.7 (L) 04/20/2019 0812   PROT 6.6 03/31/2019 1010   ALBUMIN 4.0 03/31/2019 1010   AST 42 (H) 03/31/2019 1010   ALT 41 03/31/2019 1010   ALKPHOS 77 03/31/2019 1010   BILITOT 0.5 03/31/2019 1010   GFRNONAA >60 04/20/2019 0812   GFRAA >60 04/20/2019 0812    No results found for: SPEP, UPEP  Lab Results  Component Value Date   WBC 7.6 04/20/2019   NEUTROABS 5.6 04/20/2019   HGB 12.8 04/20/2019   HCT 38.6 04/20/2019   MCV 92.6 04/20/2019   PLT 174 04/20/2019      Chemistry      Component Value Date/Time   NA 138 04/20/2019 0812   K 3.5 04/20/2019 0812   K 4.1 07/01/2014 1429   CL 104 04/20/2019 0812   CO2 27 04/20/2019 0812   BUN 16 04/20/2019 0812   CREATININE 0.39 (L) 04/20/2019 0812      Component Value Date/Time   CALCIUM 8.7 (L) 04/20/2019 0812   ALKPHOS 77 03/31/2019 1010   AST 42 (H) 03/31/2019 1010   ALT 41 03/31/2019 1010   BILITOT 0.5 03/31/2019 1010       RADIOGRAPHIC STUDIES: I have personally reviewed the radiological images as listed and agreed with the findings in the  report. No  results found.   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 March 30, 2019-significant worsening of the disease in the left pararenal space/retroperitoneal [9-10 centimeters mass]; approximately 3 to 4 cm centimeter satellite nodule in the subcutaneous fat.   # S/p Rituxa 1 week ago; and prednisone x 1 week; Improved-improvement of the left flank mass-currently 2 to 3 cm in size.  # HOLD off Rituxan-Prednisone at this time [for possible leukapheresis]. Discussed with Dr. Donella Stade regarding the timing of starting RT. we will proceed with simulation tomorrow; but hold off starting radiation until notification from [facility] regarding leukapheresis to date [hopefully this week]  # Elevated blood sugars 247-secondary to steroids prednisone; improved off steroids.   #Peripheral neuropathy/left foot drop-  STABLE.    # ILD-clinically stable.  # DISPOSITION: # HOLD Rituxan today; De-ACCESS # follow up in 2 week- MD; labs- cbc/bmp/LDH;- Dr.B   No orders of the defined types were placed in this encounter.  All questions were answered. The patient knows to call the clinic with any problems, questions or concerns.      Cammie Sickle, MD 04/20/2019 9:21 AM

## 2019-04-21 ENCOUNTER — Other Ambulatory Visit: Payer: Self-pay

## 2019-04-21 ENCOUNTER — Ambulatory Visit
Admission: RE | Admit: 2019-04-21 | Discharge: 2019-04-21 | Disposition: A | Discharge status: Home / Self Care | Payer: Medicare Other | Source: Ambulatory Visit | Attending: Radiation Oncology | Admitting: Radiation Oncology

## 2019-04-21 DIAGNOSIS — Z510 Encounter for antineoplastic radiation therapy: Secondary | ICD-10-CM | POA: Insufficient documentation

## 2019-04-21 DIAGNOSIS — Z87891 Personal history of nicotine dependence: Secondary | ICD-10-CM | POA: Diagnosis not present

## 2019-04-21 DIAGNOSIS — C8333 Diffuse large B-cell lymphoma, intra-abdominal lymph nodes: Secondary | ICD-10-CM | POA: Insufficient documentation

## 2019-04-22 ENCOUNTER — Ambulatory Visit: Payer: Medicare Other

## 2019-04-23 ENCOUNTER — Telehealth: Payer: Self-pay | Admitting: Internal Medicine

## 2019-04-23 ENCOUNTER — Ambulatory Visit: Payer: Medicare Other

## 2019-04-23 NOTE — Telephone Encounter (Signed)
Spoke to patient regarding the discussion with Dr. Thera Flake at [facility]; patient awaiting pheresis for upcoming CarT therapy on September 9 at [facility].   Patient's pain is significantly improved at this time; I think is reasonable to start radiation after  pheresis.  We will inform radiation oncology.

## 2019-04-24 ENCOUNTER — Ambulatory Visit: Payer: Medicare Other

## 2019-04-28 ENCOUNTER — Ambulatory Visit: Payer: Medicare Other

## 2019-04-29 ENCOUNTER — Ambulatory Visit: Payer: Medicare Other

## 2019-04-29 ENCOUNTER — Telehealth: Payer: Self-pay | Admitting: Internal Medicine

## 2019-04-29 NOTE — Telephone Encounter (Signed)
Left a message for the patient-recheck on pheresis/radiation schedule.   Patient on radiation schedule starting 9/10.

## 2019-04-30 ENCOUNTER — Telehealth: Payer: Self-pay | Admitting: *Deleted

## 2019-04-30 ENCOUNTER — Ambulatory Visit
Admission: RE | Admit: 2019-04-30 | Discharge: 2019-04-30 | Disposition: A | Discharge status: Home / Self Care | Payer: Medicare Other | Source: Ambulatory Visit | Attending: Radiation Oncology | Admitting: Radiation Oncology

## 2019-04-30 ENCOUNTER — Other Ambulatory Visit: Payer: Self-pay

## 2019-04-30 DIAGNOSIS — C8333 Diffuse large B-cell lymphoma, intra-abdominal lymph nodes: Secondary | ICD-10-CM | POA: Diagnosis not present

## 2019-04-30 NOTE — Telephone Encounter (Signed)
Patient called to let Dr Rogue Bussing know that everything went well at [facility] yesterday and that she will be at her appointment for radiation therapy today

## 2019-05-01 ENCOUNTER — Encounter: Payer: Self-pay | Admitting: Internal Medicine

## 2019-05-01 ENCOUNTER — Other Ambulatory Visit: Payer: Self-pay

## 2019-05-01 ENCOUNTER — Telehealth: Payer: Self-pay | Admitting: *Deleted

## 2019-05-01 ENCOUNTER — Ambulatory Visit
Admission: RE | Admit: 2019-05-01 | Discharge: 2019-05-01 | Disposition: A | Discharge status: Home / Self Care | Payer: Medicare Other | Source: Ambulatory Visit | Attending: Radiation Oncology | Admitting: Radiation Oncology

## 2019-05-01 DIAGNOSIS — C8333 Diffuse large B-cell lymphoma, intra-abdominal lymph nodes: Secondary | ICD-10-CM | POA: Diagnosis not present

## 2019-05-01 DIAGNOSIS — K589 Irritable bowel syndrome without diarrhea: Secondary | ICD-10-CM | POA: Insufficient documentation

## 2019-05-01 DIAGNOSIS — G5600 Carpal tunnel syndrome, unspecified upper limb: Secondary | ICD-10-CM | POA: Insufficient documentation

## 2019-05-01 NOTE — Telephone Encounter (Signed)
Dr. Brahmanday- please advise. 

## 2019-05-01 NOTE — Progress Notes (Signed)
Patient wanted to know if her tumor on her back had gotten larger and what the plan is. Patient also stated that she has had trouble swallowing solid foods lately even though it does not happened frequently.

## 2019-05-01 NOTE — Telephone Encounter (Signed)
Patient called requesting to speak with Nira Conn about her Chemotherapy, She does not have any appts scheduled for chemotherapy and I do not see oral chemotherapy on her medicine list [phone]

## 2019-05-02 ENCOUNTER — Telehealth: Payer: Self-pay | Admitting: Hematology and Oncology

## 2019-05-02 ENCOUNTER — Other Ambulatory Visit: Payer: Self-pay | Admitting: Hematology and Oncology

## 2019-05-02 NOTE — Telephone Encounter (Signed)
Re:  Low grade temperature  Patient called regarding low grade temperature today.  She is currently not on chemotherapy.  Last treatment with Rituxan on 04/13/2019.  Patient undergoing pheresis for CarT therapy at [facility].  Central line in neck.  Patient denies any pain or tenderness in neck.  Dressing was changed by husband.  No redness.  Patient unaware of lymphoma causing fever.  Counts normal on 04/20/2019.  Discussed close monitoring.  Patient to call back if temperature increases, chills, or any concerns.   Lequita Asal, MD

## 2019-05-04 ENCOUNTER — Other Ambulatory Visit: Payer: Self-pay

## 2019-05-04 ENCOUNTER — Ambulatory Visit
Admission: RE | Admit: 2019-05-04 | Discharge: 2019-05-04 | Disposition: A | Discharge status: Home / Self Care | Payer: Medicare Other | Source: Ambulatory Visit | Attending: Radiation Oncology | Admitting: Radiation Oncology

## 2019-05-04 ENCOUNTER — Inpatient Hospital Stay: Payer: Medicare Other | Attending: Internal Medicine

## 2019-05-04 ENCOUNTER — Inpatient Hospital Stay: Payer: Medicare Other | Admitting: Internal Medicine

## 2019-05-04 DIAGNOSIS — I10 Essential (primary) hypertension: Secondary | ICD-10-CM | POA: Diagnosis not present

## 2019-05-04 DIAGNOSIS — Z87891 Personal history of nicotine dependence: Secondary | ICD-10-CM | POA: Insufficient documentation

## 2019-05-04 DIAGNOSIS — Z5112 Encounter for antineoplastic immunotherapy: Secondary | ICD-10-CM | POA: Diagnosis not present

## 2019-05-04 DIAGNOSIS — E119 Type 2 diabetes mellitus without complications: Secondary | ICD-10-CM | POA: Insufficient documentation

## 2019-05-04 DIAGNOSIS — Z79899 Other long term (current) drug therapy: Secondary | ICD-10-CM | POA: Diagnosis not present

## 2019-05-04 DIAGNOSIS — R1310 Dysphagia, unspecified: Secondary | ICD-10-CM | POA: Diagnosis not present

## 2019-05-04 DIAGNOSIS — C8333 Diffuse large B-cell lymphoma, intra-abdominal lymph nodes: Secondary | ICD-10-CM | POA: Diagnosis not present

## 2019-05-04 DIAGNOSIS — C8338 Diffuse large B-cell lymphoma, lymph nodes of multiple sites: Secondary | ICD-10-CM | POA: Insufficient documentation

## 2019-05-04 DIAGNOSIS — R110 Nausea: Secondary | ICD-10-CM | POA: Insufficient documentation

## 2019-05-04 DIAGNOSIS — G629 Polyneuropathy, unspecified: Secondary | ICD-10-CM | POA: Diagnosis not present

## 2019-05-04 DIAGNOSIS — M21372 Foot drop, left foot: Secondary | ICD-10-CM | POA: Diagnosis not present

## 2019-05-04 DIAGNOSIS — E7800 Pure hypercholesterolemia, unspecified: Secondary | ICD-10-CM | POA: Diagnosis not present

## 2019-05-04 DIAGNOSIS — Z7984 Long term (current) use of oral hypoglycemic drugs: Secondary | ICD-10-CM | POA: Diagnosis not present

## 2019-05-04 LAB — CBC WITH DIFFERENTIAL/PLATELET
Abs Immature Granulocytes: 0.03 10*3/uL (ref 0.00–0.07)
Basophils Absolute: 0 10*3/uL (ref 0.0–0.1)
Basophils Relative: 1 %
Eosinophils Absolute: 0.2 10*3/uL (ref 0.0–0.5)
Eosinophils Relative: 5 %
HCT: 38.6 % (ref 36.0–46.0)
Hemoglobin: 12.9 g/dL (ref 12.0–15.0)
Immature Granulocytes: 1 %
Lymphocytes Relative: 13 %
Lymphs Abs: 0.6 10*3/uL — ABNORMAL LOW (ref 0.7–4.0)
MCH: 31.1 pg (ref 26.0–34.0)
MCHC: 33.4 g/dL (ref 30.0–36.0)
MCV: 93 fL (ref 80.0–100.0)
Monocytes Absolute: 0.8 10*3/uL (ref 0.1–1.0)
Monocytes Relative: 17 %
Neutro Abs: 3.1 10*3/uL (ref 1.7–7.7)
Neutrophils Relative %: 63 %
Platelets: 169 10*3/uL (ref 150–400)
RBC: 4.15 MIL/uL (ref 3.87–5.11)
RDW: 12.8 % (ref 11.5–15.5)
WBC: 4.8 10*3/uL (ref 4.0–10.5)
nRBC: 0 % (ref 0.0–0.2)

## 2019-05-04 LAB — LACTATE DEHYDROGENASE: LDH: 199 U/L — ABNORMAL HIGH (ref 98–192)

## 2019-05-04 LAB — CREATININE, SERUM
Creatinine, Ser: 0.37 mg/dL — ABNORMAL LOW (ref 0.44–1.00)
GFR calc Af Amer: >60 mL/min (ref >60)
GFR calc non Af Amer: >60 mL/min (ref >60)

## 2019-05-04 NOTE — Progress Notes (Signed)
[place] OFFICE PROGRESS NOTE  Patient Care Team: Rusty Aus, [name]  Cancer Staging No matching staging information was found for the patient.   Oncology History Overview Note  DEC 2017- DLBCL "TRIPLE HIT [myc/ bcl-2/bcl-6 gene rearrangement FISH]" ~10 cm mass RP LN; STAGE II [jan 2018- BMBx-NEG]; Jan 8th R-[facility]OP;   # JAN 29th 2018- DA-R EPOCH x5 ; PET CR; s/p RT [last 03/18/2017]  # FEB-MARCH 2019- RECURRENCE of DLBCL [s/p RP Bx; 4 cm mass inferior to Left Kidney] ; II [facility]; Dr.Grover.   # April 3rd 2019- R-GDP [carbo]; MAY 2019- PET PR; NOV 2019- PR  #August 2020-recurrence bulky left pelvis/retroperitoneal [9-10 cm]; no biopsy. #August 24th-Rituxan weekly cycle #1; prednisone 60 mg a day.   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    05/07/2019 -  Chemotherapy   The patient had riTUXimab (RITUXAN) 700 mg in sodium chloride 0.9 % 250 mL (2.1875 mg/mL) infusion, 375 mg/m2, Intravenous,  Once, 0 of 4 cycles  for chemotherapy treatment.        INTERVAL HISTORY:  [place] 73 y.o.  female pleasant patient above history of recurrent diffuse large B-cell lymphoma is here for follow-up.  The interim patient was evaluated at [facility]; underwent pheresis on 9/9.  Patient started radiation on September 10.  Patient notes to have  increasing size of the lump in the left flank.  She denies any significant pain.  Complains of mild pain; more tolerable.  She complains of nausea.  Denies any vomiting.  Admits to difficulty swallowing mostly to solids.  No difficulty in swallowing liquids.  Review of Systems  Constitutional: Positive for malaise/fatigue. Negative for chills, diaphoresis, fever and weight loss.  HENT: Negative for nosebleeds and sore throat.   Eyes: Negative for double vision.  Respiratory: Negative for cough, hemoptysis, sputum production, shortness of breath and wheezing.   Cardiovascular: Negative for chest pain, palpitations, orthopnea and leg swelling.  Gastrointestinal: Positive for nausea. Negative for abdominal pain, blood in stool, constipation, diarrhea, melena and vomiting.  Genitourinary: Negative for dysuria, frequency and urgency.  Musculoskeletal: Positive for back pain and joint pain.  Skin: Negative.  Negative for itching and rash.  Neurological: Positive for tingling. Negative for focal weakness and weakness.  Endo/Heme/Allergies: Does not bruise/bleed easily.  Psychiatric/Behavioral: Negative for depression. The patient is not nervous/anxious and does not have insomnia.       PAST MEDICAL HISTORY :  Past Medical History:  Diagnosis Date  . Diabetes mellitus without complication ([place])   . Diffuse large B-cell lymphoma ([facility])    Chemo + rad tx's  . Heart murmur   . History of chemotherapy 07/08/2017  . History of gastric ulcer   . History of radiation therapy 07/08/2017  . Hypercholesterolemia   . Hypertension   . ILD (interstitial lung disease) ([place])    8 yrs ago  . Lymphadenopathy     PAST SURGICAL HISTORY :   Past Surgical History:  Procedure Laterality Date  . BREAST BIOPSY Left 2010   neg  . CATARACT EXTRACTION W/PHACO Left 10/16/2017   Procedure: CATARACT EXTRACTION PHACO AND INTRAOCULAR LENS PLACEMENT ([place]) COMPLICATED DIABETIC LEFT;  Surgeon: Leandrew Koyanagi,  MD;  Location: [facility];  Service: Ophthalmology;  Laterality: Left;  IRIS HOOKS Diabetic - oral meds  . HERNIA REPAIR    . PARTIAL HYSTERECTOMY     age 41  . PERIPHERAL VASCULAR CATHETERIZATION N/A 08/22/2016   Procedure: [name];  Surgeon: [name], MD;  Location: [facility];  Service: Cardiovascular;  Laterality: N/A;  . TONSILLECTOMY      FAMILY HISTORY :   Family History  Problem Relation Age of Onset  . Heart disease Mother   . Heart disease Father   . Heart disease Brother     SOCIAL HISTORY:   Social History   Tobacco Use  . Smoking status: Former Smoker    Quit date: 08/21/1958    Years since quitting: 60.7  . Smokeless tobacco: Never Used  Substance Use Topics  . Alcohol use: No  . Drug use: No    ALLERGIES:  is allergic to levaquin [levofloxacin in d5w]; cefuroxime axetil; and doxycycline.  MEDICATIONS:  Current Outpatient Medications  Medication Sig Dispense Refill  . b complex vitamins tablet Take 1 tablet by mouth daily.    Marland Kitchen dicyclomine (BENTYL) 10 MG capsule Take by mouth.    . gabapentin (NEURONTIN) 300 MG capsule Take by mouth.    . latanoprost (XALATAN) 0.005 % ophthalmic solution Place 1 drop into both eyes at bedtime.     . lidocaine-prilocaine (EMLA) cream Apply cream 1 hour before chemotherapy treatment, place small amount of saran wrap over cream to protect clothing. 30 g 1  . Magnesium 400 MG TABS Take 1 tablet by mouth daily.    . metFORMIN (GLUCOPHAGE) 500 MG tablet Take 250 mg by mouth daily with breakfast.     . metoprolol tartrate (LOPRESSOR) 25 MG tablet TAKE 1 TABLET BY MOUTH TWICE DAILY 180 tablet 0  . ondansetron (ZOFRAN) 8 MG tablet One pill every 8 hours as needed for nausea/vomitting. 40 tablet 1  . pantoprazole (PROTONIX) 40 MG tablet TAKE 1 TABLET BY MOUTH TWICE DAILY    . pravastatin (PRAVACHOL) 40 MG tablet Take 1 tablet by mouth daily.    . prochlorperazine (COMPAZINE) 10 MG tablet Take 1  tablet (10 mg total) by mouth every 6 (six) hours as needed for nausea, vomiting or refractory nausea / vomiting. 40 tablet 0  . montelukast (SINGULAIR) 10 MG tablet One a day. (Patient not taking: Reported on 05/01/2019) 30 tablet 11  . traMADol (ULTRAM) 50 MG tablet Take 50 mg by mouth every 6 (six) hours as needed.     No current facility-administered medications for this visit.    Facility-Administered Medications Ordered in Other Visits  Medication Dose Route Frequency Provider Last Rate Last Dose  . 0.9 %  sodium chloride infusion   Intravenous Continuous Cammie Sickle, MD 10 mL/hr at 12/05/16 1030    . 0.9 %  sodium chloride infusion   Intravenous Continuous Cammie Sickle, MD   Stopped at 12/26/16 1011  . 0.9 %  sodium chloride infusion   Intravenous Continuous Charlaine Dalton R, MD 10 mL/hr at 12/27/16 0930    . heparin lock flush 100 unit/mL  500 Units Intravenous Once Charlaine Dalton R, MD      . heparin lock flush 100 unit/mL  500 Units Intravenous Once Charlaine Dalton R, MD      . sodium chloride flush (NS) 0.9 % injection 10 mL  10 mL Intravenous PRN Cammie Sickle, MD   10 mL at 11/06/16 1000  . sodium chloride flush (NS) 0.9 % injection 10 mL  10 mL Intravenous PRN Cammie Sickle, MD   10 mL at 11/07/16 0905  . sodium chloride flush (NS) 0.9 % injection 10 mL  10 mL Intravenous PRN Charlaine Dalton R, MD      . sodium chloride flush (NS) 0.9 % injection 10 mL  10 mL Intravenous PRN Cammie Sickle, MD   10 mL at 12/05/16 1030  . sodium chloride flush (NS) 0.9 % injection 10 mL  10 mL Intravenous PRN Cammie Sickle, MD   10 mL at 12/27/16 0930    PHYSICAL EXAMINATION: ECOG PERFORMANCE STATUS: 1 - Symptomatic but completely ambulatory  BP 127/85 (BP Location: Left Arm, Patient Position: Sitting)   Pulse 85   Temp 98.1 F (36.7 C) (Tympanic)   Ht 5\' 7"  (1.702 m)   Wt 145 lb 9.6 oz (66 kg)   BMI 22.80 kg/m   Filed  Weights   05/04/19 1316  Weight: 145 lb 9.6 oz (66 kg)    Physical Exam  Constitutional: She is oriented to person, place, and time and well-developed, well-nourished, and in no distress.  She is alone.  Walking independently.  HENT:  Head: Normocephalic and atraumatic.  Mouth/Throat: Oropharynx is clear and moist. No oropharyngeal exudate.  Eyes: Pupils are equal, round, and reactive to light.  Neck: Normal range of motion. Neck supple.  Cardiovascular: Normal rate and regular rhythm.  Pulmonary/Chest: No respiratory distress. She has no wheezes.  Positive for crackles bilateral lung fields at the bases.  Abdominal: Soft. Bowel sounds are normal. She exhibits no distension and no mass. There is no abdominal tenderness. There is no rebound and no guarding.  Musculoskeletal: Normal range of motion.        General: No tenderness or edema.     Comments: Approximately 3 to 4 cm mass noted posterior left flank.  Neurological: She is alert and oriented to person, place, and time.  Skin: Skin is warm.  Psychiatric: Affect normal.   LABORATORY DATA:  I have reviewed the data as listed    Component Value Date/Time   NA 138 04/20/2019 0812   K 3.5 04/20/2019 0812   K 4.1 07/01/2014 1429   CL 104 04/20/2019 0812   CO2 27 04/20/2019 0812   GLUCOSE 138 (H) 04/20/2019 0812   BUN 16 04/20/2019 0812   CREATININE 0.37 (L) 05/04/2019 1248   CALCIUM 8.7 (L) 04/20/2019 0812   PROT 6.6 03/31/2019 1010   ALBUMIN 4.0 03/31/2019 1010   AST 42 (H) 03/31/2019 1010   ALT 41 03/31/2019 1010   ALKPHOS 77 03/31/2019 1010   BILITOT 0.5 03/31/2019 1010   GFRNONAA >60 05/04/2019 1248   GFRAA >60 05/04/2019 1248    No results found for: SPEP, UPEP  Lab Results  Component Value Date   WBC 4.8 05/04/2019   NEUTROABS 3.1 05/04/2019   HGB 12.9 05/04/2019   HCT 38.6 05/04/2019   MCV 93.0 05/04/2019   PLT 169 05/04/2019      Chemistry      Component Value Date/Time   NA 138 04/20/2019 0812   K  3.5 04/20/2019 0812   K 4.1 07/01/2014 1429   CL 104 04/20/2019 0812   CO2  27 04/20/2019 0812   BUN 16 04/20/2019 0812   CREATININE 0.37 (L) 05/04/2019 1248      Component Value Date/Time   CALCIUM 8.7 (L) 04/20/2019 0812   ALKPHOS 77 03/31/2019 1010   AST 42 (H) 03/31/2019 1010   ALT 41 03/31/2019 1010   BILITOT 0.5 03/31/2019 1010       RADIOGRAPHIC STUDIES: I have personally reviewed the radiological images as listed and agreed with the findings in the report. No results found.   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 March 30, 2019-significant worsening of the disease in the left pararenal space/retroperitoneal [9-10 centimeters mass]; approximately 3 to 4 cm centimeter satellite nodule in the subcutaneous fat. Currently on RT until [Oct 7th]  # plan to RE-START Rituxan; but SQ; Hold off prednisone [sec to BG].   # Nausea/difficulty swallowing to solids x 10 days- ? RT vs other. Recommend PPI; if not improved then esophagogram.   #Peripheral neuropathy/left foot drop-  STABLE.    # ILD- STABLE.   # DISPOSITION: # SQ Rituxan on 9/17.  # follow up in 9/24 MD; labs- cbc/bmp/LDH;- Dr.B   Orders Placed This Encounter  Procedures  . CBC with Differential    Standing Status:   Future    Standing Expiration Date:   05/03/2020  . Basic metabolic panel    Standing Status:   Future    Standing Expiration Date:   05/03/2020  . Lactate dehydrogenase    Standing Status:   Future    Standing Expiration Date:   05/03/2020   All questions were answered. The patient knows to call the clinic with any problems, questions or concerns.      Cammie Sickle, MD 05/04/2019 4:46 PM

## 2019-05-04 NOTE — Progress Notes (Signed)
OFF PATHWAY REGIMEN - Lymphoma and CLL  No Change  Continue With Treatment as Ordered.   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

## 2019-05-04 NOTE — Assessment & Plan Note (Addendum)
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 March 30, 2019-significant worsening of the disease in the left pararenal space/retroperitoneal [9-10 centimeters mass]; approximately 3 to 4 cm centimeter satellite nodule in the subcutaneous fat. Currently on RT until [Oct 7th]  # plan to RE-START Rituxan; but SQ; Hold off prednisone [sec to BG].   # Nausea/difficulty swallowing to solids x 10 days- ? RT vs other. Recommend PPI; if not improved then esophagogram.   #Peripheral neuropathy/left foot drop-  STABLE.    # ILD- STABLE.   # DISPOSITION: # SQ Rituxan on 9/17.  # follow up in 9/24 MD; labs- cbc/bmp/LDH;- Dr.B

## 2019-05-05 ENCOUNTER — Other Ambulatory Visit: Payer: Self-pay

## 2019-05-05 ENCOUNTER — Ambulatory Visit
Admission: RE | Admit: 2019-05-05 | Discharge: 2019-05-05 | Disposition: A | Discharge status: Home / Self Care | Payer: Medicare Other | Source: Ambulatory Visit | Attending: Radiation Oncology | Admitting: Radiation Oncology

## 2019-05-05 ENCOUNTER — Other Ambulatory Visit: Payer: Self-pay | Admitting: *Deleted

## 2019-05-05 DIAGNOSIS — C8333 Diffuse large B-cell lymphoma, intra-abdominal lymph nodes: Secondary | ICD-10-CM | POA: Diagnosis not present

## 2019-05-05 MED ORDER — ONDANSETRON HCL 8 MG PO TABS: ORAL_TABLET | ORAL | 1 refills | Status: DC

## 2019-05-06 ENCOUNTER — Other Ambulatory Visit: Payer: Self-pay

## 2019-05-06 ENCOUNTER — Ambulatory Visit
Admission: RE | Admit: 2019-05-06 | Discharge: 2019-05-06 | Disposition: A | Discharge status: Home / Self Care | Payer: Medicare Other | Source: Ambulatory Visit | Attending: Radiation Oncology | Admitting: Radiation Oncology

## 2019-05-06 DIAGNOSIS — C8333 Diffuse large B-cell lymphoma, intra-abdominal lymph nodes: Secondary | ICD-10-CM | POA: Diagnosis not present

## 2019-05-07 ENCOUNTER — Ambulatory Visit
Admission: RE | Admit: 2019-05-07 | Discharge: 2019-05-07 | Disposition: A | Discharge status: Home / Self Care | Payer: Medicare Other | Source: Ambulatory Visit | Attending: Radiation Oncology | Admitting: Radiation Oncology

## 2019-05-07 ENCOUNTER — Other Ambulatory Visit: Payer: Self-pay

## 2019-05-07 ENCOUNTER — Other Ambulatory Visit: Payer: Self-pay | Admitting: Internal Medicine

## 2019-05-07 ENCOUNTER — Inpatient Hospital Stay: Payer: Medicare Other

## 2019-05-07 VITALS — BP 131/68 | HR 68 | Temp 98.3°F | Resp 18

## 2019-05-07 DIAGNOSIS — Z5112 Encounter for antineoplastic immunotherapy: Secondary | ICD-10-CM | POA: Diagnosis not present

## 2019-05-07 DIAGNOSIS — C8333 Diffuse large B-cell lymphoma, intra-abdominal lymph nodes: Secondary | ICD-10-CM | POA: Diagnosis not present

## 2019-05-07 MED ORDER — RITUXIMAB-HYALURONIDASE HUMAN 1400-23400 MG -UT/11.7ML ~~LOC~~ SOLN
1400.0000 mg | Freq: Once | SUBCUTANEOUS | Status: AC
  Administered 2019-05-07: 12:00:00 1400 mg via SUBCUTANEOUS
  Filled 2019-05-07: qty 11.7

## 2019-05-07 MED ORDER — DIPHENHYDRAMINE HCL 25 MG PO CAPS
50.0000 mg | ORAL_CAPSULE | Freq: Once | ORAL | Status: AC
  Administered 2019-05-07: 50 mg via ORAL
  Filled 2019-05-07: qty 2

## 2019-05-07 MED ORDER — ACETAMINOPHEN 325 MG PO TABS
650.0000 mg | ORAL_TABLET | Freq: Once | ORAL | Status: AC
  Administered 2019-05-07: 11:00:00 650 mg via ORAL
  Filled 2019-05-07: qty 2

## 2019-05-07 NOTE — Progress Notes (Signed)
1145: Pt tolerated injection well. No s/s of reaction at this time. Pt to be monitored for a minimum of 15 minutes.  1204: injection site, clean dry and intact. No redness, swelling or s/s of injection reaction. Pt denies any concerns or complaints at this time and presents no s/s of reaction. Pt and VS stable at discharge.

## 2019-05-08 ENCOUNTER — Other Ambulatory Visit: Payer: Self-pay

## 2019-05-08 ENCOUNTER — Ambulatory Visit
Admission: RE | Admit: 2019-05-08 | Discharge: 2019-05-08 | Disposition: A | Discharge status: Home / Self Care | Payer: Medicare Other | Source: Ambulatory Visit | Attending: Radiation Oncology | Admitting: Radiation Oncology

## 2019-05-08 DIAGNOSIS — C8333 Diffuse large B-cell lymphoma, intra-abdominal lymph nodes: Secondary | ICD-10-CM | POA: Diagnosis not present

## 2019-05-11 ENCOUNTER — Other Ambulatory Visit: Payer: Self-pay

## 2019-05-11 ENCOUNTER — Ambulatory Visit
Admission: RE | Admit: 2019-05-11 | Discharge: 2019-05-11 | Disposition: A | Discharge status: Home / Self Care | Payer: Medicare Other | Source: Ambulatory Visit | Attending: Radiation Oncology | Admitting: Radiation Oncology

## 2019-05-11 DIAGNOSIS — C8333 Diffuse large B-cell lymphoma, intra-abdominal lymph nodes: Secondary | ICD-10-CM | POA: Diagnosis not present

## 2019-05-12 ENCOUNTER — Ambulatory Visit
Admission: RE | Admit: 2019-05-12 | Discharge: 2019-05-12 | Disposition: A | Discharge status: Home / Self Care | Payer: Medicare Other | Source: Ambulatory Visit | Attending: Radiation Oncology | Admitting: Radiation Oncology

## 2019-05-12 ENCOUNTER — Other Ambulatory Visit: Payer: Self-pay

## 2019-05-12 DIAGNOSIS — C8333 Diffuse large B-cell lymphoma, intra-abdominal lymph nodes: Secondary | ICD-10-CM | POA: Diagnosis not present

## 2019-05-13 ENCOUNTER — Ambulatory Visit
Admission: RE | Admit: 2019-05-13 | Discharge: 2019-05-13 | Disposition: A | Discharge status: Home / Self Care | Payer: Medicare Other | Source: Ambulatory Visit | Attending: Radiation Oncology | Admitting: Radiation Oncology

## 2019-05-13 ENCOUNTER — Other Ambulatory Visit: Payer: Self-pay

## 2019-05-13 DIAGNOSIS — C8333 Diffuse large B-cell lymphoma, intra-abdominal lymph nodes: Secondary | ICD-10-CM | POA: Diagnosis not present

## 2019-05-14 ENCOUNTER — Other Ambulatory Visit: Payer: Self-pay

## 2019-05-14 ENCOUNTER — Inpatient Hospital Stay: Payer: Medicare Other

## 2019-05-14 ENCOUNTER — Ambulatory Visit
Admission: RE | Admit: 2019-05-14 | Discharge: 2019-05-14 | Disposition: A | Discharge status: Home / Self Care | Payer: Medicare Other | Source: Ambulatory Visit | Attending: Radiation Oncology | Admitting: Radiation Oncology

## 2019-05-14 ENCOUNTER — Inpatient Hospital Stay (HOSPITAL_BASED_OUTPATIENT_CLINIC_OR_DEPARTMENT_OTHER): Payer: Medicare Other | Admitting: Internal Medicine

## 2019-05-14 DIAGNOSIS — C8333 Diffuse large B-cell lymphoma, intra-abdominal lymph nodes: Secondary | ICD-10-CM

## 2019-05-14 DIAGNOSIS — Z5112 Encounter for antineoplastic immunotherapy: Secondary | ICD-10-CM | POA: Diagnosis not present

## 2019-05-14 LAB — CBC WITH DIFFERENTIAL/PLATELET
Abs Immature Granulocytes: 0.04 10*3/uL (ref 0.00–0.07)
Basophils Absolute: 0 10*3/uL (ref 0.0–0.1)
Basophils Relative: 1 %
Eosinophils Absolute: 0.2 10*3/uL (ref 0.0–0.5)
Eosinophils Relative: 4 %
HCT: 38.4 % (ref 36.0–46.0)
Hemoglobin: 12.5 g/dL (ref 12.0–15.0)
Immature Granulocytes: 1 %
Lymphocytes Relative: 11 %
Lymphs Abs: 0.5 10*3/uL — ABNORMAL LOW (ref 0.7–4.0)
MCH: 30.8 pg (ref 26.0–34.0)
MCHC: 32.6 g/dL (ref 30.0–36.0)
MCV: 94.6 fL (ref 80.0–100.0)
Monocytes Absolute: 0.7 10*3/uL (ref 0.1–1.0)
Monocytes Relative: 15 %
Neutro Abs: 3.3 10*3/uL (ref 1.7–7.7)
Neutrophils Relative %: 68 %
Platelets: 220 10*3/uL (ref 150–400)
RBC: 4.06 MIL/uL (ref 3.87–5.11)
RDW: 13.2 % (ref 11.5–15.5)
WBC: 4.8 10*3/uL (ref 4.0–10.5)
nRBC: 0 % (ref 0.0–0.2)

## 2019-05-14 LAB — BASIC METABOLIC PANEL
Anion gap: 9 (ref 5–15)
BUN: 14 mg/dL (ref 8–23)
CO2: 27 mmol/L (ref 22–32)
Calcium: 9 mg/dL (ref 8.9–10.3)
Chloride: 105 mmol/L (ref 98–111)
Creatinine, Ser: 0.55 mg/dL (ref 0.44–1.00)
GFR calc Af Amer: >60 mL/min (ref >60)
GFR calc non Af Amer: >60 mL/min (ref >60)
Glucose, Bld: 125 mg/dL — ABNORMAL HIGH (ref 70–99)
Potassium: 3.8 mmol/L (ref 3.5–5.1)
Sodium: 141 mmol/L (ref 135–145)

## 2019-05-14 LAB — LACTATE DEHYDROGENASE: LDH: 151 U/L (ref 98–192)

## 2019-05-14 MED ORDER — GABAPENTIN 300 MG PO CAPS: 300.0000 mg | ORAL_CAPSULE | Freq: Two times a day (BID) | ORAL | 3 refills | Status: DC

## 2019-05-14 NOTE — Progress Notes (Signed)
[place] OFFICE PROGRESS NOTE  Patient Care Team: Rusty Aus, [name]  Cancer Staging No matching staging information was found for the patient.   Oncology History Overview Note  DEC 2017- DLBCL "TRIPLE HIT [myc/ bcl-2/bcl-6 gene rearrangement FISH]" ~10 cm mass RP LN; STAGE II [jan 2018- BMBx-NEG]; Jan 8th R-CHOP;   # JAN 29th 2018- DA-R EPOCH x5 ; PET CR; s/p RT [last 03/18/2017]  # FEB-MARCH 2019- RECURRENCE of DLBCL [s/p RP Bx; 4 cm mass inferior to Left Kidney] ; II [facility]; Dr.Grover.   # April 3rd 2019- R-GDP [carbo]; MAY 2019- PET PR; NOV 2019- PR  #August 2020-recurrence bulky left pelvis/retroperitoneal [9-10 cm]; no biopsy. #August 24th-Rituxan weekly cycle #1; prednisone 60 mg a day.   #August 2020-2D echo ejection fraction 60 to 65%  --------------------------------  # JAN 26th-LP  # Interstitial Lung disease [surveillance] --------------------------------------------------------------------------  DIAGNOSIS: [facility] 2019 ] RECURRENT DLBCL  STAGE:   Recurrent ; GOALS: CURATIVE  CURRENT/MOST RECENT THERAPY: surveillaince   Diffuse large B-cell lymphoma of intra-abdominal lymph nodes ([place])  04/13/2019 - 04/19/2019 Chemotherapy   The patient had riTUXimab-pvvr (RUXIENCE) 700 mg in sodium chloride 0.9 % 250 mL chemo infusion, , Intravenous, Once, 1 of 4 cycles Administration:  (04/13/2019)  for chemotherapy treatment.    05/07/2019 -  Chemotherapy   The patient had riTUXimab-hyaluronidase human (RITUXAN HYCELA) 1400-23400 MG -UT/11.7ML injection SQ 1,400 mg, 1,400 mg, Subcutaneous,  Once, 1 of 4 cycles Administration: 1,400 mg (05/07/2019)  for chemotherapy treatment.        INTERVAL HISTORY:  [place] 73 y.o.  female pleasant patient above history of recurrent diffuse large B-cell lymphoma is here for follow-up.  Patient underwent pheresis on September 9 in anticipation of CART therapy.  Patient is  currently getting radiation; started September 10  The interim patient was evaluated at [facility]; underwent pheresis on 9/9.  Patient started radiation on September 10.  After discussion with [facility] oncology-it was decided to abbreviate the radiation until September 30 again in anticipation of car T therapy. Pt had PET scan yesterday; awaiting results from [facility].   Patient notes her left flank nodule smaller.  Is not hurting as it was before.  Review of Systems  Constitutional: Positive for malaise/fatigue. Negative for chills, diaphoresis, fever and weight loss.  HENT: Negative for nosebleeds and sore throat.   Eyes: Negative for double vision.  Respiratory: Negative for cough, hemoptysis, sputum production, shortness of breath and wheezing.   Cardiovascular: Negative for chest pain, palpitations, orthopnea and leg swelling.  Gastrointestinal: Positive for nausea. Negative for abdominal pain, blood in stool, constipation, diarrhea, melena and vomiting.  Genitourinary: Negative for dysuria, frequency and urgency.  Musculoskeletal: Positive for back pain and joint pain.  Skin: Negative.  Negative for itching and rash.  Neurological: Positive for tingling. Negative for focal weakness and weakness.  Endo/Heme/Allergies: Does not bruise/bleed easily.  Psychiatric/Behavioral: Negative for depression. The patient is not nervous/anxious and does not have insomnia.     PAST MEDICAL HISTORY :  Past Medical History:  Diagnosis Date  . Diabetes mellitus without complication ([place])   . Diffuse large B-cell lymphoma ([facility])    Chemo + rad tx's  . Heart murmur   . History of chemotherapy 07/08/2017  . History of gastric ulcer   . History of radiation therapy 07/08/2017  . Hypercholesterolemia   . Hypertension   . ILD (interstitial lung disease) ([place])    8  yrs ago  . Lymphadenopathy     PAST SURGICAL HISTORY :   Past Surgical History:  Procedure Laterality Date  . BREAST BIOPSY Left 2010   neg  .  CATARACT EXTRACTION W/PHACO Left 10/16/2017   Procedure: CATARACT EXTRACTION PHACO AND INTRAOCULAR LENS PLACEMENT ([place]) COMPLICATED DIABETIC LEFT;  Surgeon: Leandrew Koyanagi, MD;  Location: [facility];  Service: Ophthalmology;  Laterality: Left;  IRIS HOOKS Diabetic - oral meds  . HERNIA REPAIR    . PARTIAL HYSTERECTOMY     age 57  . PERIPHERAL VASCULAR CATHETERIZATION N/A 08/22/2016   Procedure: [name];  Surgeon: [name], MD;  Location: [place] CV [facility];  Service: Cardiovascular;  Laterality: N/A;  . TONSILLECTOMY      FAMILY HISTORY :   Family History  Problem Relation Age of Onset  . Heart disease Mother   . Heart disease Father   . Heart disease Brother     SOCIAL HISTORY:   Social History   Tobacco Use  . Smoking status: Former Smoker    Quit date: 08/21/1958    Years since quitting: 60.7  . Smokeless tobacco: Never Used  Substance Use Topics  . Alcohol use: No  . Drug use: No    ALLERGIES:  is allergic to levaquin [levofloxacin in d5w]; cefuroxime axetil; and doxycycline.  MEDICATIONS:  Current Outpatient Medications  Medication Sig Dispense Refill  . b complex vitamins tablet Take 1 tablet by mouth daily.    Marland Kitchen dicyclomine (BENTYL) 10 MG capsule Take by mouth.    . gabapentin (NEURONTIN) 300 MG capsule Take 1 capsule (300 mg total) by mouth 2 (two) times daily. 60 capsule 3  . latanoprost (XALATAN) 0.005 % ophthalmic solution Place 1 drop into both eyes at bedtime.     . lidocaine-prilocaine (EMLA) cream Apply cream 1 hour before chemotherapy treatment, place small amount of saran wrap over cream to protect clothing. 30 g 1  . Magnesium 400 MG TABS Take 1 tablet by mouth daily.    . metFORMIN (GLUCOPHAGE) 500 MG tablet Take 250 mg by mouth daily with breakfast.     . metoprolol tartrate (LOPRESSOR) 25 MG tablet TAKE 1 TABLET BY MOUTH TWICE DAILY 180 tablet 0  . montelukast (SINGULAIR) 10 MG tablet One a day. (Patient not taking:  Reported on 05/01/2019) 30 tablet 11  . ondansetron (ZOFRAN) 8 MG tablet One pill every 8 hours as needed for nausea/vomitting. 40 tablet 1  . pantoprazole (PROTONIX) 40 MG tablet TAKE 1 TABLET BY MOUTH TWICE DAILY    . pravastatin (PRAVACHOL) 40 MG tablet Take 1 tablet by mouth daily.    . prochlorperazine (COMPAZINE) 10 MG tablet Take 1 tablet (10 mg total) by mouth every 6 (six) hours as needed for nausea, vomiting or refractory nausea / vomiting. 40 tablet 0  . traMADol (ULTRAM) 50 MG tablet Take 50 mg by mouth every 6 (six) hours as needed.     No current facility-administered medications for this visit.    Facility-Administered Medications Ordered in Other Visits  Medication Dose Route Frequency Provider Last Rate Last Dose  . 0.9 %  sodium chloride infusion   Intravenous Continuous Cammie Sickle, MD 10 mL/hr at 12/05/16 1030    . 0.9 %  sodium chloride infusion   Intravenous Continuous Cammie Sickle, MD   Stopped at 12/26/16 1011  . 0.9 %  sodium chloride infusion   Intravenous Continuous Cammie Sickle, MD 10 mL/hr at 12/27/16 0930    .  heparin lock flush 100 unit/mL  500 Units Intravenous Once Charlaine Dalton R, MD      . heparin lock flush 100 unit/mL  500 Units Intravenous Once Charlaine Dalton R, MD      . sodium chloride flush (NS) 0.9 % injection 10 mL  10 mL Intravenous PRN Cammie Sickle, MD   10 mL at 11/06/16 1000  . sodium chloride flush (NS) 0.9 % injection 10 mL  10 mL Intravenous PRN Cammie Sickle, MD   10 mL at 11/07/16 0905  . sodium chloride flush (NS) 0.9 % injection 10 mL  10 mL Intravenous PRN Charlaine Dalton R, MD      . sodium chloride flush (NS) 0.9 % injection 10 mL  10 mL Intravenous PRN Cammie Sickle, MD   10 mL at 12/05/16 1030  . sodium chloride flush (NS) 0.9 % injection 10 mL  10 mL Intravenous PRN Cammie Sickle, MD   10 mL at 12/27/16 0930    PHYSICAL EXAMINATION: ECOG PERFORMANCE STATUS: 1  - Symptomatic but completely ambulatory  BP (!) 127/52 (BP Location: Left Arm)   Pulse 65   Temp 99.8 F (37.7 C) (Tympanic)   Ht 5\' 7"  (1.702 m)   Wt 148 lb (67.1 kg)   BMI 23.18 kg/m   Filed Weights   05/14/19 1044  Weight: 148 lb (67.1 kg)    Physical Exam  Constitutional: She is oriented to person, place, and time and well-developed, well-nourished, and in no distress.  She is alone.  Walking independently.  HENT:  Head: Normocephalic and atraumatic.  Mouth/Throat: Oropharynx is clear and moist. No oropharyngeal exudate.  Eyes: Pupils are equal, round, and reactive to light.  Neck: Normal range of motion. Neck supple.  Cardiovascular: Normal rate and regular rhythm.  Pulmonary/Chest: No respiratory distress. She has no wheezes.  Positive for crackles bilateral lung fields at the bases.  Abdominal: Soft. Bowel sounds are normal. She exhibits no distension and no mass. There is no abdominal tenderness. There is no rebound and no guarding.  Musculoskeletal: Normal range of motion.        General: No tenderness or edema.     Comments: Approximately 2-3 cm mass noted posterior left flank.  Neurological: She is alert and oriented to person, place, and time.  Skin: Skin is warm.  Psychiatric: Affect normal.   LABORATORY DATA:  I have reviewed the data as listed    Component Value Date/Time   NA 141 05/14/2019 1027   K 3.8 05/14/2019 1027   K 4.1 07/01/2014 1429   CL 105 05/14/2019 1027   CO2 27 05/14/2019 1027   GLUCOSE 125 (H) 05/14/2019 1027   BUN 14 05/14/2019 1027   CREATININE 0.55 05/14/2019 1027   CALCIUM 9.0 05/14/2019 1027   PROT 6.6 03/31/2019 1010   ALBUMIN 4.0 03/31/2019 1010   AST 42 (H) 03/31/2019 1010   ALT 41 03/31/2019 1010   ALKPHOS 77 03/31/2019 1010   BILITOT 0.5 03/31/2019 1010   GFRNONAA >60 05/14/2019 1027   GFRAA >60 05/14/2019 1027    No results found for: SPEP, UPEP  Lab Results  Component Value Date   WBC 4.8 05/14/2019   NEUTROABS  3.3 05/14/2019   HGB 12.5 05/14/2019   HCT 38.4 05/14/2019   MCV 94.6 05/14/2019   PLT 220 05/14/2019      Chemistry      Component Value Date/Time   NA 141 05/14/2019 1027   K 3.8 05/14/2019  1027   K 4.1 07/01/2014 1429   CL 105 05/14/2019 1027   CO2 27 05/14/2019 1027   BUN 14 05/14/2019 1027   CREATININE 0.55 05/14/2019 1027      Component Value Date/Time   CALCIUM 9.0 05/14/2019 1027   ALKPHOS 77 03/31/2019 1010   AST 42 (H) 03/31/2019 1010   ALT 41 03/31/2019 1010   BILITOT 0.5 03/31/2019 1010       RADIOGRAPHIC STUDIES: I have personally reviewed the radiological images as listed and agreed with the findings in the report. No results found.   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 March 30, 2019-significant worsening of the disease in the left pararenal space/retroperitoneal [9-10 centimeters mass]; approximately 3 to 4 cm centimeter satellite nodule in the subcutaneous fat. Currently on RT until [sep 30th].  Discussed with Dr. Donella Stade.  #Status post rituximab infusion x2 [08/24 & 09/17].  Hold further Rituxan infusion at this time.  I left a message for Dr. Leilani Able to figure out the logistics of CarT therapy.  # Nausea/difficulty swallowing to solids x 2-4 week- ? RT vs other. on PPI;-stable.  #Peripheral neuropathy/left foot drop-  STABLE   # ILD- stable.   # DISPOSITION:  # follow up in 2 weeks MD; labs- cbc/bmp/LDH;possibleRituxan SQ- Dr.B   No orders of the defined types were placed in this encounter.  All questions were answered. The patient knows to call the clinic with any problems, questions or concerns.      Cammie Sickle, MD 05/14/2019 11:10 AM

## 2019-05-14 NOTE — Addendum Note (Signed)
Addended by: Sandria Bales B on: 05/14/2019 11:17 AM   Modules accepted: Orders

## 2019-05-14 NOTE — Assessment & Plan Note (Addendum)
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 March 30, 2019-significant worsening of the disease in the left pararenal space/retroperitoneal [9-10 centimeters mass]; approximately 3 to 4 cm centimeter satellite nodule in the subcutaneous fat. Currently on RT until [sep 30th].  Discussed with Dr. Donella Stade.  #Status post rituximab infusion x2 [08/24 & 09/17].  Hold further Rituxan infusion at this time.  I left a message for Dr. Leilani Able to figure out the logistics of CarT therapy.  # Nausea/difficulty swallowing to solids x 2-4 week- ? RT vs other. on PPI;-stable.  #Peripheral neuropathy/left foot drop-  STABLE   # ILD- stable.   # DISPOSITION:  # follow up in 2 weeks MD; labs- cbc/bmp/LDH;possibleRituxan SQ- Dr.B

## 2019-05-15 ENCOUNTER — Ambulatory Visit
Admission: RE | Admit: 2019-05-15 | Discharge: 2019-05-15 | Disposition: A | Discharge status: Home / Self Care | Payer: Medicare Other | Source: Ambulatory Visit | Attending: Radiation Oncology | Admitting: Radiation Oncology

## 2019-05-15 ENCOUNTER — Other Ambulatory Visit: Payer: Self-pay

## 2019-05-15 ENCOUNTER — Ambulatory Visit: Payer: Medicare Other

## 2019-05-15 DIAGNOSIS — C8333 Diffuse large B-cell lymphoma, intra-abdominal lymph nodes: Secondary | ICD-10-CM | POA: Diagnosis not present

## 2019-05-18 ENCOUNTER — Ambulatory Visit
Admission: RE | Admit: 2019-05-18 | Discharge: 2019-05-18 | Disposition: A | Discharge status: Home / Self Care | Payer: Medicare Other | Source: Ambulatory Visit | Attending: Radiation Oncology | Admitting: Radiation Oncology

## 2019-05-18 DIAGNOSIS — C8333 Diffuse large B-cell lymphoma, intra-abdominal lymph nodes: Secondary | ICD-10-CM | POA: Diagnosis not present

## 2019-05-19 ENCOUNTER — Other Ambulatory Visit: Payer: Medicare Other

## 2019-05-19 ENCOUNTER — Ambulatory Visit
Admission: RE | Admit: 2019-05-19 | Discharge: 2019-05-19 | Disposition: A | Discharge status: Home / Self Care | Payer: Medicare Other | Source: Ambulatory Visit | Attending: Radiation Oncology | Admitting: Radiation Oncology

## 2019-05-19 ENCOUNTER — Ambulatory Visit: Payer: Medicare Other | Admitting: Internal Medicine

## 2019-05-19 ENCOUNTER — Other Ambulatory Visit: Payer: Self-pay

## 2019-05-19 DIAGNOSIS — C8333 Diffuse large B-cell lymphoma, intra-abdominal lymph nodes: Secondary | ICD-10-CM | POA: Diagnosis not present

## 2019-05-20 ENCOUNTER — Ambulatory Visit: Payer: Medicare Other

## 2019-05-21 ENCOUNTER — Ambulatory Visit: Payer: Medicare Other

## 2019-05-22 ENCOUNTER — Ambulatory Visit: Payer: Medicare Other

## 2019-05-25 ENCOUNTER — Ambulatory Visit: Payer: Medicare Other

## 2019-05-26 ENCOUNTER — Ambulatory Visit: Payer: Medicare Other

## 2019-05-26 ENCOUNTER — Telehealth: Payer: Self-pay | Admitting: Internal Medicine

## 2019-05-26 NOTE — Telephone Encounter (Signed)
Spoke to patient current follow-ups.  Patient awaiting bronchoscopy biopsy for lung mass-on October 8th.  Most likely treatment with Car T therapy in the next 2 weeks or so.  Recommend for now canceling appointments with us/follow-up as planned at [facility].  Call us if any concerns/follow-ups.

## 2019-05-27 ENCOUNTER — Ambulatory Visit: Payer: Medicare Other

## 2019-05-28 ENCOUNTER — Ambulatory Visit: Payer: Medicare Other

## 2019-05-28 ENCOUNTER — Ambulatory Visit: Payer: Medicare Other | Admitting: Internal Medicine

## 2019-05-28 ENCOUNTER — Other Ambulatory Visit: Payer: Medicare Other

## 2019-06-24 ENCOUNTER — Ambulatory Visit: Payer: Medicare Other | Admitting: Radiation Oncology

## 2019-07-20 ENCOUNTER — Telehealth: Payer: Self-pay | Admitting: Internal Medicine

## 2019-07-20 DIAGNOSIS — C8333 Diffuse large B-cell lymphoma, intra-abdominal lymph nodes: Secondary | ICD-10-CM

## 2019-07-20 NOTE — Telephone Encounter (Signed)
Spoke to patient regarding recent treatment [facility].  Clinically doing well.  Patient will follow up with me on December 9-labs CBC CMP LDH.

## 2019-07-21 NOTE — Addendum Note (Signed)
Addended by: Sabino Gasser on: 07/21/2019 08:35 AM   Modules accepted: Orders

## 2019-07-21 NOTE — Telephone Encounter (Signed)
apts already scheduled. Lab orders entered.

## 2019-07-22 ENCOUNTER — Ambulatory Visit: Payer: Medicare Other | Admitting: Radiation Oncology

## 2019-07-29 ENCOUNTER — Other Ambulatory Visit: Payer: Self-pay

## 2019-07-29 ENCOUNTER — Inpatient Hospital Stay: Payer: Medicare Other | Attending: Internal Medicine

## 2019-07-29 ENCOUNTER — Inpatient Hospital Stay: Payer: Medicare Other | Admitting: Internal Medicine

## 2019-07-29 DIAGNOSIS — Z90710 Acquired absence of both cervix and uterus: Secondary | ICD-10-CM | POA: Diagnosis not present

## 2019-07-29 DIAGNOSIS — G629 Polyneuropathy, unspecified: Secondary | ICD-10-CM | POA: Insufficient documentation

## 2019-07-29 DIAGNOSIS — C8338 Diffuse large B-cell lymphoma, lymph nodes of multiple sites: Secondary | ICD-10-CM | POA: Insufficient documentation

## 2019-07-29 DIAGNOSIS — Z923 Personal history of irradiation: Secondary | ICD-10-CM | POA: Insufficient documentation

## 2019-07-29 DIAGNOSIS — Z7984 Long term (current) use of oral hypoglycemic drugs: Secondary | ICD-10-CM | POA: Insufficient documentation

## 2019-07-29 DIAGNOSIS — Z79899 Other long term (current) drug therapy: Secondary | ICD-10-CM | POA: Diagnosis not present

## 2019-07-29 DIAGNOSIS — C8333 Diffuse large B-cell lymphoma, intra-abdominal lymph nodes: Secondary | ICD-10-CM

## 2019-07-29 DIAGNOSIS — M21372 Foot drop, left foot: Secondary | ICD-10-CM | POA: Diagnosis not present

## 2019-07-29 DIAGNOSIS — Z87891 Personal history of nicotine dependence: Secondary | ICD-10-CM | POA: Diagnosis not present

## 2019-07-29 DIAGNOSIS — E119 Type 2 diabetes mellitus without complications: Secondary | ICD-10-CM | POA: Diagnosis not present

## 2019-07-29 DIAGNOSIS — Z8249 Family history of ischemic heart disease and other diseases of the circulatory system: Secondary | ICD-10-CM | POA: Diagnosis not present

## 2019-07-29 DIAGNOSIS — Z9221 Personal history of antineoplastic chemotherapy: Secondary | ICD-10-CM | POA: Diagnosis not present

## 2019-07-29 DIAGNOSIS — E7800 Pure hypercholesterolemia, unspecified: Secondary | ICD-10-CM | POA: Insufficient documentation

## 2019-07-29 DIAGNOSIS — I10 Essential (primary) hypertension: Secondary | ICD-10-CM | POA: Diagnosis not present

## 2019-07-29 LAB — COMPREHENSIVE METABOLIC PANEL
ALT: 12 U/L (ref 0–44)
AST: 15 U/L (ref 15–41)
Albumin: 3.9 g/dL (ref 3.5–5.0)
Alkaline Phosphatase: 46 U/L (ref 38–126)
Anion gap: 8 (ref 5–15)
BUN: 14 mg/dL (ref 8–23)
CO2: 25 mmol/L (ref 22–32)
Calcium: 9 mg/dL (ref 8.9–10.3)
Chloride: 107 mmol/L (ref 98–111)
Creatinine, Ser: 0.6 mg/dL (ref 0.44–1.00)
GFR calc Af Amer: >60 mL/min (ref >60)
GFR calc non Af Amer: >60 mL/min (ref >60)
Glucose, Bld: 129 mg/dL — ABNORMAL HIGH (ref 70–99)
Potassium: 3.8 mmol/L (ref 3.5–5.1)
Sodium: 140 mmol/L (ref 135–145)
Total Bilirubin: 0.4 mg/dL (ref 0.3–1.2)
Total Protein: 6.2 g/dL — ABNORMAL LOW (ref 6.5–8.1)

## 2019-07-29 LAB — CBC WITH DIFFERENTIAL/PLATELET
Abs Immature Granulocytes: 0.01 10*3/uL (ref 0.00–0.07)
Basophils Absolute: 0 10*3/uL (ref 0.0–0.1)
Basophils Relative: 1 %
Eosinophils Absolute: 0.2 10*3/uL (ref 0.0–0.5)
Eosinophils Relative: 4 %
HCT: 36.4 % (ref 36.0–46.0)
Hemoglobin: 11.6 g/dL — ABNORMAL LOW (ref 12.0–15.0)
Immature Granulocytes: 0 %
Lymphocytes Relative: 7 %
Lymphs Abs: 0.3 10*3/uL — ABNORMAL LOW (ref 0.7–4.0)
MCH: 32 pg (ref 26.0–34.0)
MCHC: 31.9 g/dL (ref 30.0–36.0)
MCV: 100.3 fL — ABNORMAL HIGH (ref 80.0–100.0)
Monocytes Absolute: 0.9 10*3/uL (ref 0.1–1.0)
Monocytes Relative: 17 %
Neutro Abs: 3.7 10*3/uL (ref 1.7–7.7)
Neutrophils Relative %: 71 %
Platelets: 194 10*3/uL (ref 150–400)
RBC: 3.63 MIL/uL — ABNORMAL LOW (ref 3.87–5.11)
RDW: 14.4 % (ref 11.5–15.5)
WBC: 5.1 10*3/uL (ref 4.0–10.5)
nRBC: 0 % (ref 0.0–0.2)

## 2019-07-29 LAB — LACTATE DEHYDROGENASE: LDH: 143 U/L (ref 98–192)

## 2019-07-29 NOTE — Assessment & Plan Note (Addendum)
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  Stable.  Reviewed the records from [facility]; discussed with Dr. Thera Flake.  #Continue follow-up with [facility]; January 2020./Awaiting repeat PET.   # Prophylactic antibiotics with Valtrex and Bactrim.  #Peripheral neuropathy/left foot drop-stable.  # ILD-stable  #Port flush [facility].  # DISPOSITION:  # follow up in mid Feb-MD; labs- cbc/bmp/LDH-- Dr.B

## 2019-07-29 NOTE — Progress Notes (Signed)
[facility] OFFICE PROGRESS NOTE  Patient Care Team: Rusty Aus, [name]  Cancer Staging No matching staging information was found for the patient.   Oncology History Overview Note  DEC 2017- DLBCL "TRIPLE HIT [myc/ bcl-2/bcl-6 gene rearrangement FISH]" ~10 cm mass RP LN; STAGE II [jan 2018- BMBx-NEG]; Jan 8th R-[facility]OP;   # JAN 29th 2018- DA-R EPOCH x5 ; PET CR; s/p RT [last 03/18/2017]  # FEB-MARCH 2019- RECURRENCE of DLBCL [s/p RP Bx; 4 cm mass inferior to Left Kidney] ; II [facility]; Dr.Grover.   # April 3rd 2019- R-GDP [carbo]; MAY 2019- PET PR; NOV 2019- PR  #August 2020-recurrence bulky left pelvis/retroperitoneal [9-10 cm]; no biopsy. #August 24th-Rituxan weekly cycle #1; prednisone 60 mg a day.   06/03/2019 - Chemotherapy  BMT OP CAR-T LYMPHODEPLETION FLU/CY + IP AXICABTAGENE CILOLEUCEL (YESCARTA) Fludarabine 30 mg/m2 IV Days 1, 2, 3 Cyclophosphamide 500 mg/m2 IV Days 1, 2, 3 Axicabtagene ciloleucel target dose 2 x 10^6 CAR-positive viable T cells per kg body weight (maximum of 2 x 10^8 CAR-positive viable T cells) on Day 6.  # SEP 2020- 78month PET-[facility]- significant PR.   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    05/07/2019 -  Chemotherapy   The patient had riTUXimab-hyaluronidase human (RITUXAN HYCELA) 1400-23400 MG  -UT/11.7ML injection SQ 1,400 mg, 1,400 mg, Subcutaneous,  Once, 1 of 4 cycles Administration: 1,400 mg (05/07/2019)  for chemotherapy treatment.        INTERVAL HISTORY:  [place] 73 y.o.  female pleasant patient above history of recurrent diffuse large B-cell lymphoma is here for follow-up.  Patient is currently s/p CAR-T therapy at [facility] in October 2020.  Patient is here for follow-up.  Review of Systems  Constitutional: Positive for malaise/fatigue. Negative for chills, diaphoresis, fever and weight loss.  HENT: Negative for nosebleeds and sore throat.   Eyes: Negative for double vision.  Respiratory: Negative for cough, hemoptysis, sputum production, shortness of breath and wheezing.   Cardiovascular: Negative for chest pain, palpitations, orthopnea and leg swelling.  Gastrointestinal: Negative for abdominal pain, blood in stool, constipation, diarrhea, melena and vomiting.  Genitourinary: Negative for dysuria, frequency and urgency.  Musculoskeletal: Positive for joint pain.  Skin: Negative.  Negative for itching and rash.  Neurological: Positive for tingling. Negative for focal weakness and weakness.  Endo/Heme/Allergies: Does not bruise/bleed easily.  Psychiatric/Behavioral: Negative for depression. The patient is not nervous/anxious and does not have insomnia.     PAST MEDICAL HISTORY :  Past Medical History:  Diagnosis Date  . Diabetes mellitus without complication ([place])   . Diffuse large B-cell lymphoma (H[facility])    Chemo + rad tx's  . Heart murmur   . History of chemotherapy 07/08/2017  . History of gastric ulcer   . History of radiation therapy 07/08/2017  . Hypercholesterolemia   . Hypertension   . ILD (interstitial lung disease) ([place])    8 yrs ago  . Lymphadenopathy  PAST SURGICAL HISTORY :   Past Surgical History:  Procedure Laterality Date  . BREAST BIOPSY Left 2010   neg  . CATARACT EXTRACTION W/PHACO Left 10/16/2017   Procedure: CATARACT EXTRACTION  PHACO AND INTRAOCULAR LENS PLACEMENT ([place]) COMPLICATED DIABETIC LEFT;  Surgeon: Leandrew Koyanagi, MD;  Location: [facility];  Service: Ophthalmology;  Laterality: Left;  IRIS HOOKS Diabetic - oral meds  . HERNIA REPAIR    . PARTIAL HYSTERECTOMY     age 64  . PERIPHERAL VASCULAR CATHETERIZATION N/A 08/22/2016   Procedure: [name];  Surgeon: [name], MD;  Location: [facility];  Service: Cardiovascular;  Laterality: N/A;  . TONSILLECTOMY      FAMILY HISTORY :   Family History  Problem Relation Age of Onset  . Heart disease Mother   . Heart disease Father   . Heart disease Brother     SOCIAL HISTORY:   Social History   Tobacco Use  . Smoking status: Former Smoker    Quit date: 08/21/1958    Years since quitting: 60.9  . Smokeless tobacco: Never Used  Substance Use Topics  . Alcohol use: No  . Drug use: No    ALLERGIES:  is allergic to levaquin [levofloxacin in d5w]; cefuroxime axetil; and doxycycline.  MEDICATIONS:  Current Outpatient Medications  Medication Sig Dispense Refill  . b complex vitamins tablet Take 1 tablet by mouth daily.    Marland Kitchen gabapentin (NEURONTIN) 300 MG capsule Take 1 capsule (300 mg total) by mouth 2 (two) times daily. (Patient taking differently: Take 300 mg by mouth 3 (three) times daily. 1 in am and 2 at night) 60 capsule 3  . latanoprost (XALATAN) 0.005 % ophthalmic solution Place 1 drop into both eyes at bedtime.     . lidocaine-prilocaine (EMLA) cream Apply cream 1 hour before chemotherapy treatment, place small amount of saran wrap over cream to protect clothing. 30 g 1  . Magnesium 400 MG TABS Take 1 tablet by mouth daily.    . metoprolol tartrate (LOPRESSOR) 25 MG tablet TAKE 1 TABLET BY MOUTH TWICE DAILY (Patient taking differently: 12.5 mg 2 (two) times daily. (BETA BLOCKER)) 180 tablet 0  . pantoprazole (PROTONIX) 40 MG tablet Take 40 mg by mouth daily.     . prochlorperazine (COMPAZINE) 10 MG tablet Take 1  tablet (10 mg total) by mouth every 6 (six) hours as needed for nausea, vomiting or refractory nausea / vomiting. 40 tablet 0  . sulfamethoxazole-trimethoprim (BACTRIM DS) 800-160 MG tablet Take 1 tablet by mouth 3 (three) times a week.    . traMADol (ULTRAM) 50 MG tablet Take 50 mg by mouth every 6 (six) hours as needed.    . valACYclovir (VALTREX) 500 MG tablet Take 1 tablet by mouth every morning.    . dicyclomine (BENTYL) 10 MG capsule Take by mouth.    . metFORMIN (GLUCOPHAGE) 500 MG tablet Take 250 mg by mouth daily with breakfast.     . montelukast (SINGULAIR) 10 MG tablet One a day. (Patient not taking: Reported on 05/01/2019) 30 tablet 11  . ondansetron (ZOFRAN) 8 MG tablet One pill every 8 hours as needed for nausea/vomitting. (Patient not taking: Reported on 07/29/2019) 40 tablet 1  . pravastatin (PRAVACHOL) 40 MG tablet Take 1 tablet by mouth daily.     No current facility-administered medications for this visit.    Facility-Administered Medications Ordered in Other Visits  Medication Dose Route Frequency Provider Last Rate Last Dose  . 0.9 %  sodium chloride infusion   Intravenous Continuous Cammie Sickle, MD 10 mL/hr at 12/05/16 1030    . 0.9 %  sodium chloride infusion   Intravenous Continuous Cammie Sickle, MD   Stopped at 12/26/16 1011  . 0.9 %  sodium chloride infusion   Intravenous Continuous Charlaine Dalton R, MD 10 mL/hr at 12/27/16 0930    . heparin lock flush 100 unit/mL  500 Units Intravenous Once Charlaine Dalton R, MD      . heparin lock flush 100 unit/mL  500 Units Intravenous Once Charlaine Dalton R, MD      . sodium chloride flush (NS) 0.9 % injection 10 mL  10 mL Intravenous PRN Cammie Sickle, MD   10 mL at 11/06/16 1000  . sodium chloride flush (NS) 0.9 % injection 10 mL  10 mL Intravenous PRN Cammie Sickle, MD   10 mL at 11/07/16 0905  . sodium chloride flush (NS) 0.9 % injection 10 mL  10 mL Intravenous PRN Charlaine Dalton R, MD      . sodium chloride flush (NS) 0.9 % injection 10 mL  10 mL Intravenous PRN Cammie Sickle, MD   10 mL at 12/05/16 1030  . sodium chloride flush (NS) 0.9 % injection 10 mL  10 mL Intravenous PRN Cammie Sickle, MD   10 mL at 12/27/16 0930    PHYSICAL EXAMINATION: ECOG PERFORMANCE STATUS: 1 - Symptomatic but completely ambulatory  BP 113/68 (BP Location: Left Arm, Patient Position: Sitting, Cuff Size: Normal)   Pulse 76   Temp 98.2 F (36.8 C) (Tympanic)   Wt 140 lb (63.5 kg)   BMI 21.93 kg/m   Filed Weights   07/29/19 1002  Weight: 140 lb (63.5 kg)    Physical Exam  Constitutional: She is oriented to person, place, and time and well-developed, well-nourished, and in no distress.  She is alone.  Walking independently.  HENT:  Head: Normocephalic and atraumatic.  Mouth/Throat: Oropharynx is clear and moist. No oropharyngeal exudate.  Eyes: Pupils are equal, round, and reactive to light.  Neck: Normal range of motion. Neck supple.  Cardiovascular: Normal rate and regular rhythm.  Pulmonary/Chest: No respiratory distress. She has no wheezes.  Positive for crackles bilateral lung fields at the bases.  Abdominal: Soft. Bowel sounds are normal. She exhibits no distension and no mass. There is no abdominal tenderness. There is no rebound and no guarding.  Musculoskeletal: Normal range of motion.        General: No tenderness or edema.  Neurological: She is alert and oriented to person, place, and time.  Skin: Skin is warm.  Psychiatric: Affect normal.   LABORATORY DATA:  I have reviewed the data as listed    Component Value Date/Time   NA 140 07/29/2019 0953   K 3.8 07/29/2019 0953   K 4.1 07/01/2014 1429   CL 107 07/29/2019 0953   CO2 25 07/29/2019 0953   GLUCOSE 129 (H) 07/29/2019 0953   BUN 14 07/29/2019 0953   CREATININE 0.60 07/29/2019 0953   CALCIUM 9.0 07/29/2019 0953   PROT 6.2 (L) 07/29/2019 0953   ALBUMIN 3.9 07/29/2019 0953   AST  15 07/29/2019 0953   ALT 12 07/29/2019 0953   ALKPHOS 46 07/29/2019 0953   BILITOT 0.4 07/29/2019 0953   GFRNONAA >60 07/29/2019 0953   GFRAA >60 07/29/2019 0953    No results found for: SPEP, UPEP  Lab Results  Component Value Date   WBC 5.1 07/29/2019  NEUTROABS 3.7 07/29/2019   HGB 11.6 (L) 07/29/2019   HCT 36.4 07/29/2019   MCV 100.3 (H) 07/29/2019   PLT 194 07/29/2019      Chemistry      Component Value Date/Time   NA 140 07/29/2019 0953   K 3.8 07/29/2019 0953   K 4.1 07/01/2014 1429   CL 107 07/29/2019 0953   CO2 25 07/29/2019 0953   BUN 14 07/29/2019 0953   CREATININE 0.60 07/29/2019 0953      Component Value Date/Time   CALCIUM 9.0 07/29/2019 0953   ALKPHOS 46 07/29/2019 0953   AST 15 07/29/2019 0953   ALT 12 07/29/2019 0953   BILITOT 0.4 07/29/2019 0953       RADIOGRAPHIC STUDIES: I have personally reviewed the radiological images as listed and agreed with the findings in the report. No results found.   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  Stable.  Reviewed the records [facility]; discussed with Dr. Thera Flake.  #Continue follow-up with [facility]; January 2020./Awaiting repeat PET.   # Prophylactic antibiotics with Valtrex and Bactrim.  #Peripheral neuropathy/left foot drop-stable.  # ILD-stable  #Port flush [facility].  # DISPOSITION:  # follow up in mid Feb-MD; labs- cbc/bmp/LDH-- Dr.B   Orders Placed This Encounter  Procedures  . CBC with Differential    Standing Status:   Future    Standing Expiration Date:   07/28/2020  . Basic metabolic panel    Standing Status:   Future    Standing Expiration Date:   07/28/2020  . Lactate dehydrogenase    Standing Status:   Future    Standing Expiration Date:   07/28/2020   All questions were answered. The  patient knows to call the clinic with any problems, questions or concerns.      Cammie Sickle, MD 07/29/2019 4:21 PM

## 2019-09-23 ENCOUNTER — Other Ambulatory Visit: Payer: Self-pay | Admitting: Internal Medicine

## 2019-09-30 ENCOUNTER — Inpatient Hospital Stay: Payer: Medicare PPO | Attending: Internal Medicine

## 2019-09-30 ENCOUNTER — Inpatient Hospital Stay: Payer: Medicare PPO | Admitting: Internal Medicine

## 2019-09-30 ENCOUNTER — Other Ambulatory Visit: Payer: Self-pay

## 2019-09-30 DIAGNOSIS — Z79899 Other long term (current) drug therapy: Secondary | ICD-10-CM | POA: Diagnosis not present

## 2019-09-30 DIAGNOSIS — E119 Type 2 diabetes mellitus without complications: Secondary | ICD-10-CM | POA: Diagnosis not present

## 2019-09-30 DIAGNOSIS — Z87891 Personal history of nicotine dependence: Secondary | ICD-10-CM | POA: Insufficient documentation

## 2019-09-30 DIAGNOSIS — M21372 Foot drop, left foot: Secondary | ICD-10-CM | POA: Diagnosis not present

## 2019-09-30 DIAGNOSIS — C8333 Diffuse large B-cell lymphoma, intra-abdominal lymph nodes: Secondary | ICD-10-CM

## 2019-09-30 DIAGNOSIS — G629 Polyneuropathy, unspecified: Secondary | ICD-10-CM | POA: Diagnosis not present

## 2019-09-30 DIAGNOSIS — Z7984 Long term (current) use of oral hypoglycemic drugs: Secondary | ICD-10-CM | POA: Diagnosis not present

## 2019-09-30 DIAGNOSIS — Z8249 Family history of ischemic heart disease and other diseases of the circulatory system: Secondary | ICD-10-CM | POA: Diagnosis not present

## 2019-09-30 DIAGNOSIS — Z9221 Personal history of antineoplastic chemotherapy: Secondary | ICD-10-CM | POA: Diagnosis not present

## 2019-09-30 DIAGNOSIS — I10 Essential (primary) hypertension: Secondary | ICD-10-CM | POA: Insufficient documentation

## 2019-09-30 DIAGNOSIS — C8338 Diffuse large B-cell lymphoma, lymph nodes of multiple sites: Secondary | ICD-10-CM | POA: Diagnosis not present

## 2019-09-30 DIAGNOSIS — Z923 Personal history of irradiation: Secondary | ICD-10-CM | POA: Diagnosis not present

## 2019-09-30 DIAGNOSIS — E7800 Pure hypercholesterolemia, unspecified: Secondary | ICD-10-CM | POA: Insufficient documentation

## 2019-09-30 LAB — CBC WITH DIFFERENTIAL/PLATELET
Abs Immature Granulocytes: 0.01 10*3/uL (ref 0.00–0.07)
Basophils Absolute: 0 10*3/uL (ref 0.0–0.1)
Basophils Relative: 1 %
Eosinophils Absolute: 0.2 10*3/uL (ref 0.0–0.5)
Eosinophils Relative: 5 %
HCT: 34.7 % — ABNORMAL LOW (ref 36.0–46.0)
Hemoglobin: 11.1 g/dL — ABNORMAL LOW (ref 12.0–15.0)
Immature Granulocytes: 0 %
Lymphocytes Relative: 6 %
Lymphs Abs: 0.2 10*3/uL — ABNORMAL LOW (ref 0.7–4.0)
MCH: 31.4 pg (ref 26.0–34.0)
MCHC: 32 g/dL (ref 30.0–36.0)
MCV: 98.3 fL (ref 80.0–100.0)
Monocytes Absolute: 0.7 10*3/uL (ref 0.1–1.0)
Monocytes Relative: 21 %
Neutro Abs: 2.2 10*3/uL (ref 1.7–7.7)
Neutrophils Relative %: 67 %
Platelets: 177 10*3/uL (ref 150–400)
RBC: 3.53 MIL/uL — ABNORMAL LOW (ref 3.87–5.11)
RDW: 12.4 % (ref 11.5–15.5)
WBC: 3.3 10*3/uL — ABNORMAL LOW (ref 4.0–10.5)
nRBC: 0 % (ref 0.0–0.2)

## 2019-09-30 LAB — BASIC METABOLIC PANEL
Anion gap: 9 (ref 5–15)
BUN: 15 mg/dL (ref 8–23)
CO2: 25 mmol/L (ref 22–32)
Calcium: 8.9 mg/dL (ref 8.9–10.3)
Chloride: 105 mmol/L (ref 98–111)
Creatinine, Ser: 0.59 mg/dL (ref 0.44–1.00)
GFR calc Af Amer: >60 mL/min (ref >60)
GFR calc non Af Amer: >60 mL/min (ref >60)
Glucose, Bld: 123 mg/dL — ABNORMAL HIGH (ref 70–99)
Potassium: 4 mmol/L (ref 3.5–5.1)
Sodium: 139 mmol/L (ref 135–145)

## 2019-09-30 LAB — LACTATE DEHYDROGENASE: LDH: 143 U/L (ref 98–192)

## 2019-09-30 NOTE — Assessment & Plan Note (Addendum)
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  #No clinical evidence of progression at this time.  Stable. Awaiting repeat PET in March 21st.  Discussed with Dr. Thera Flake  # Prophylactic antibiotics with Valtrex and Bactrim.  #Peripheral neuropathy/left foot drop-stable  # ILD-stable  # DISPOSITION:  # follow up in 1 st week of April 2021-MD; labs- cbc/bmp/LDH; port flush- Dr.B

## 2019-09-30 NOTE — Progress Notes (Signed)
[place] OFFICE PROGRESS NOTE  Patient Care Team: Rusty Aus, [name]  Cancer Staging No matching staging information was found for the patient.   Oncology History Overview Note  DEC 2017- DLBCL "TRIPLE HIT [myc/ bcl-2/bcl-6 gene rearrangement FISH]" ~10 cm mass RP LN; STAGE II [jan 2018- BMBx-NEG]; Jan 8th R-[facility];   # JAN 29th 2018- DA-R EPOCH x5 ; PET CR; s/p RT [last 03/18/2017]  # FEB-MARCH 2019- RECURRENCE of DLBCL [s/p RP Bx; 4 cm mass inferior to Left Kidney] ; II [facility]; Dr.Grover.   # April 3rd 2019- R-GDP [carbo]; MAY 2019- PET PR; NOV 2019- PR  #August 2020-recurrence bulky left pelvis/retroperitoneal [9-10 cm]; no biopsy. #August 24th-Rituxan weekly cycle #1; prednisone 60 mg a day.   06/03/2019 - Chemotherapy  BMT OP CAR-T LYMPHODEPLETION FLU/CY + IP AXICABTAGENE CILOLEUCEL (YESCARTA) Fludarabine 30 mg/m2 IV Days 1, 2, 3 Cyclophosphamide 500 mg/m2 IV Days 1, 2, 3 Axicabtagene ciloleucel target dose 2 x 10^6 CAR-positive viable T cells per kg body weight (maximum of 2 x 10^8 CAR-positive viable T cells) on Day 6.  # SEP 2020- 65month PET-[facility]- significant PR.   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    05/07/2019 -  Chemotherapy   The patient had riTUXimab-hyaluronidase human (RITUXAN HYCELA) 1400-23400 MG  -UT/11.7ML injection SQ 1,400 mg, 1,400 mg, Subcutaneous,  Once, 1 of 4 cycles Administration: 1,400 mg (05/07/2019)  for chemotherapy treatment.        INTERVAL HISTORY:  NATALIN REINERTSON 74 y.o.  female pleasant patient above history of recurrent diffuse large B-cell lymphoma is here for follow-up patient currently status post car T-cell therapy in mid October 2020.  In the interim patient was evaluated with a PET scan/follow-up with [facility].  She continues on surveillance.  Patient denies any worsening lumps or bumps or pain.  Appetite is fair.  Chronic mild fatigue.  Review of Systems  Constitutional: Positive for malaise/fatigue. Negative for chills, diaphoresis, fever and weight loss.  HENT: Negative for nosebleeds and sore throat.   Eyes: Negative for double vision.  Respiratory: Negative for cough, hemoptysis, sputum production, shortness of breath and wheezing.   Cardiovascular: Negative for chest pain, palpitations, orthopnea and leg swelling.  Gastrointestinal: Negative for abdominal pain, blood in stool, constipation, diarrhea, melena and vomiting.  Genitourinary: Negative for dysuria, frequency and urgency.  Musculoskeletal: Positive for joint pain.  Skin: Negative.  Negative for itching and rash.  Neurological: Positive for tingling. Negative for focal weakness and weakness.  Endo/Heme/Allergies: Does not bruise/bleed easily.  Psychiatric/Behavioral: Negative for depression. The patient is not nervous/anxious and does not have insomnia.     PAST MEDICAL HISTORY :  Past Medical History:  Diagnosis Date  . Diabetes mellitus without complication ([place])   . Diffuse large B-cell lymphoma (HCC)    Chemo + rad tx's  . Heart murmur   . History of chemotherapy 07/08/2017  . History of gastric ulcer   . History of radiation therapy  07/08/2017  . Hypercholesterolemia   . Hypertension   . ILD (interstitial lung disease) ([place])    8 yrs ago  . Lymphadenopathy     PAST SURGICAL  HISTORY :   Past Surgical History:  Procedure Laterality Date  . BREAST BIOPSY Left 2010   neg  . CATARACT EXTRACTION W/PHACO Left 10/16/2017   Procedure: CATARACT EXTRACTION PHACO AND INTRAOCULAR LENS PLACEMENT ([place]) COMPLICATED DIABETIC LEFT;  Surgeon: Leandrew Koyanagi, MD;  Location: [place];  Service: Ophthalmology;  Laterality: Left;  IRIS HOOKS Diabetic - oral meds  . HERNIA REPAIR    . PARTIAL HYSTERECTOMY     age 39  . PERIPHERAL VASCULAR CATHETERIZATION N/A 08/22/2016   Procedure: [name];  Surgeon: [name], MD;  Location: [place] [facility];  Service: Cardiovascular;  Laterality: N/A;  . TONSILLECTOMY      FAMILY HISTORY :   Family History  Problem Relation Age of Onset  . Heart disease Mother   . Heart disease Father   . Heart disease Brother     SOCIAL HISTORY:   Social History   Tobacco Use  . Smoking status: Former Smoker    Quit date: 08/21/1958    Years since quitting: 61.1  . Smokeless tobacco: Never Used  Substance Use Topics  . Alcohol use: No  . Drug use: No    ALLERGIES:  is allergic to levaquin [levofloxacin in d5w]; cefuroxime axetil; and doxycycline.  MEDICATIONS:  Current Outpatient Medications  Medication Sig Dispense Refill  . b complex vitamins tablet Take 1 tablet by mouth daily.    Marland Kitchen dicyclomine (BENTYL) 10 MG capsule Take by mouth.    . gabapentin (NEURONTIN) 300 MG capsule Take 1 capsule (300 mg total) by mouth 2 (two) times daily. (Patient taking differently: Take 300 mg by mouth 3 (three) times daily. 1 in am and 2 at night) 60 capsule 3  . latanoprost (XALATAN) 0.005 % ophthalmic solution Place 1 drop into both eyes at bedtime.     . lidocaine-prilocaine (EMLA) cream Apply cream 1 hour before chemotherapy treatment, place small amount of saran wrap over cream to protect clothing. 30 g 1  . Magnesium 400 MG TABS Take 1 tablet by mouth daily.    . metFORMIN (GLUCOPHAGE) 500 MG tablet Take 250 mg by  mouth daily with breakfast.     . metoprolol tartrate (LOPRESSOR) 25 MG tablet TAKE 1 TABLET BY MOUTH TWICE DAILY (Patient taking differently: 12.5 mg 2 (two) times daily. (BETA BLOCKER)) 180 tablet 0  . montelukast (SINGULAIR) 10 MG tablet One a day. (Patient not taking: Reported on 05/01/2019) 30 tablet 11  . ondansetron (ZOFRAN) 8 MG tablet TAKE 1 TABLET BY MOUTH EVERY 8 HOURS AS NEEDED FOR NAUSEA AND VOMITING 40 tablet 0  . pantoprazole (PROTONIX) 40 MG tablet Take 40 mg by mouth daily.     . pravastatin (PRAVACHOL) 40 MG tablet Take 1 tablet by mouth daily.    . prochlorperazine (COMPAZINE) 10 MG tablet Take 1 tablet (10 mg total) by mouth every 6 (six) hours as needed for nausea, vomiting or refractory nausea / vomiting. 40 tablet 0  . sulfamethoxazole-trimethoprim (BACTRIM DS) 800-160 MG tablet Take 1 tablet by mouth 3 (three) times a week.    . traMADol (ULTRAM) 50 MG tablet Take 50 mg by mouth every 6 (six) hours as needed.    . valACYclovir (VALTREX) 500 MG tablet Take 1 tablet by mouth every morning.     No  current facility-administered medications for this visit.   Facility-Administered Medications Ordered in Other Visits  Medication Dose Route Frequency Provider Last Rate Last Admin  . 0.9 %  sodium chloride infusion   Intravenous Continuous Cammie Sickle, MD 10 mL/hr at 12/05/16 1030 New Bag at 12/05/16 1030  . 0.9 %  sodium chloride infusion   Intravenous Continuous Cammie Sickle, MD   Stopped at 12/26/16 1011  . 0.9 %  sodium chloride infusion   Intravenous Continuous Charlaine Dalton R, MD 10 mL/hr at 12/27/16 0930 New Bag at 12/27/16 0930  . heparin lock flush 100 unit/mL  500 Units Intravenous Once Charlaine Dalton R, MD      . heparin lock flush 100 unit/mL  500 Units Intravenous Once Charlaine Dalton R, MD      . sodium chloride flush (NS) 0.9 % injection 10 mL  10 mL Intravenous PRN Cammie Sickle, MD   10 mL at 11/06/16 1000  . sodium  chloride flush (NS) 0.9 % injection 10 mL  10 mL Intravenous PRN Cammie Sickle, MD   10 mL at 11/07/16 0905  . sodium chloride flush (NS) 0.9 % injection 10 mL  10 mL Intravenous PRN Charlaine Dalton R, MD      . sodium chloride flush (NS) 0.9 % injection 10 mL  10 mL Intravenous PRN Cammie Sickle, MD   10 mL at 12/05/16 1030  . sodium chloride flush (NS) 0.9 % injection 10 mL  10 mL Intravenous PRN Cammie Sickle, MD   10 mL at 12/27/16 0930    PHYSICAL EXAMINATION: ECOG PERFORMANCE STATUS: 1 - Symptomatic but completely ambulatory  BP (!) 114/53 (BP Location: Left Arm, Patient Position: Sitting, Cuff Size: Normal)   Pulse 73   Temp 98.6 F (37 C) (Tympanic)   Wt 142 lb (64.4 kg)   BMI 22.24 kg/m   Filed Weights   09/30/19 1045  Weight: 142 lb (64.4 kg)    Physical Exam  Constitutional: She is oriented to person, place, and time and well-developed, well-nourished, and in no distress.  She is alone.  Walking independently.  HENT:  Head: Normocephalic and atraumatic.  Mouth/Throat: Oropharynx is clear and moist. No oropharyngeal exudate.  Eyes: Pupils are equal, round, and reactive to light.  Cardiovascular: Normal rate and regular rhythm.  Pulmonary/Chest: No respiratory distress. She has no wheezes.  Positive for crackles bilateral lung fields at the bases.  Abdominal: Soft. Bowel sounds are normal. She exhibits no distension and no mass. There is no abdominal tenderness. There is no rebound and no guarding.  Musculoskeletal:        General: No tenderness or edema. Normal range of motion.     Cervical back: Normal range of motion and neck supple.  Neurological: She is alert and oriented to person, place, and time.  Skin: Skin is warm.  Psychiatric: Affect normal.   LABORATORY DATA:  I have reviewed the data as listed    Component Value Date/Time   NA 139 09/30/2019 1013   K 4.0 09/30/2019 1013   K 4.1 07/01/2014 1429   CL 105 09/30/2019 1013    CO2 25 09/30/2019 1013   GLUCOSE 123 (H) 09/30/2019 1013   BUN 15 09/30/2019 1013   CREATININE 0.59 09/30/2019 1013   CALCIUM 8.9 09/30/2019 1013   PROT 6.2 (L) 07/29/2019 0953   ALBUMIN 3.9 07/29/2019 0953   AST 15 07/29/2019 0953   ALT 12 07/29/2019 0953   ALKPHOS 46  07/29/2019 0953   BILITOT 0.4 07/29/2019 0953   GFRNONAA >60 09/30/2019 1013   GFRAA >60 09/30/2019 1013    No results found for: SPEP, UPEP  Lab Results  Component Value Date   WBC 3.3 (L) 09/30/2019   NEUTROABS 2.2 09/30/2019   HGB 11.1 (L) 09/30/2019   HCT 34.7 (L) 09/30/2019   MCV 98.3 09/30/2019   PLT 177 09/30/2019      Chemistry      Component Value Date/Time   NA 139 09/30/2019 1013   K 4.0 09/30/2019 1013   K 4.1 07/01/2014 1429   CL 105 09/30/2019 1013   CO2 25 09/30/2019 1013   BUN 15 09/30/2019 1013   CREATININE 0.59 09/30/2019 1013      Component Value Date/Time   CALCIUM 8.9 09/30/2019 1013   ALKPHOS 46 07/29/2019 0953   AST 15 07/29/2019 0953   ALT 12 07/29/2019 0953   BILITOT 0.4 07/29/2019 0953       RADIOGRAPHIC STUDIES: I have personally reviewed the radiological images as listed and agreed with the findings in the report. No results found.   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  #No clinical evidence of progression at this time.  Stable. Awaiting repeat PET in March 21st.  Discussed with Dr. Thera Flake  # Prophylactic antibiotics with Valtrex and Bactrim.  #Peripheral neuropathy/left foot drop-stable  # ILD-stable  # DISPOSITION:  # follow up in 1 st week of April 2021-MD; labs- cbc/bmp/LDH; port flush- Dr.B   Orders Placed This Encounter  Procedures  . CBC with Differential    Standing  Status:   Future    Standing Expiration Date:   09/29/2020  . Basic metabolic panel    Standing Status:   Future    Standing Expiration Date:   09/29/2020  . Lactate dehydrogenase    Standing Status:   Future    Standing Expiration Date:   09/29/2020   All questions were answered. The patient knows to call the clinic with any problems, questions or concerns.      Cammie Sickle, MD 09/30/2019 1:08 PM

## 2019-11-03 ENCOUNTER — Other Ambulatory Visit: Payer: Self-pay | Admitting: Internal Medicine

## 2019-11-25 ENCOUNTER — Inpatient Hospital Stay: Payer: Medicare PPO

## 2019-11-25 ENCOUNTER — Inpatient Hospital Stay: Payer: Medicare PPO | Admitting: Internal Medicine

## 2019-11-26 ENCOUNTER — Inpatient Hospital Stay: Payer: Medicare PPO | Admitting: Internal Medicine

## 2019-11-26 ENCOUNTER — Encounter: Payer: Self-pay | Admitting: Internal Medicine

## 2019-11-26 ENCOUNTER — Other Ambulatory Visit: Payer: Self-pay

## 2019-11-26 ENCOUNTER — Inpatient Hospital Stay: Payer: Medicare PPO | Attending: Internal Medicine

## 2019-11-26 VITALS — BP 134/51 | HR 67 | Temp 98.7°F | Resp 20 | Wt 146.0 lb

## 2019-11-26 DIAGNOSIS — Z90710 Acquired absence of both cervix and uterus: Secondary | ICD-10-CM | POA: Insufficient documentation

## 2019-11-26 DIAGNOSIS — I10 Essential (primary) hypertension: Secondary | ICD-10-CM | POA: Diagnosis not present

## 2019-11-26 DIAGNOSIS — Z9221 Personal history of antineoplastic chemotherapy: Secondary | ICD-10-CM | POA: Diagnosis not present

## 2019-11-26 DIAGNOSIS — C8338 Diffuse large B-cell lymphoma, lymph nodes of multiple sites: Secondary | ICD-10-CM | POA: Insufficient documentation

## 2019-11-26 DIAGNOSIS — E7800 Pure hypercholesterolemia, unspecified: Secondary | ICD-10-CM | POA: Diagnosis not present

## 2019-11-26 DIAGNOSIS — Z923 Personal history of irradiation: Secondary | ICD-10-CM | POA: Diagnosis not present

## 2019-11-26 DIAGNOSIS — Z79899 Other long term (current) drug therapy: Secondary | ICD-10-CM | POA: Insufficient documentation

## 2019-11-26 DIAGNOSIS — C8333 Diffuse large B-cell lymphoma, intra-abdominal lymph nodes: Secondary | ICD-10-CM

## 2019-11-26 DIAGNOSIS — Z7984 Long term (current) use of oral hypoglycemic drugs: Secondary | ICD-10-CM | POA: Diagnosis not present

## 2019-11-26 DIAGNOSIS — E119 Type 2 diabetes mellitus without complications: Secondary | ICD-10-CM | POA: Diagnosis not present

## 2019-11-26 DIAGNOSIS — Z8249 Family history of ischemic heart disease and other diseases of the circulatory system: Secondary | ICD-10-CM | POA: Diagnosis not present

## 2019-11-26 DIAGNOSIS — Z95828 Presence of other vascular implants and grafts: Secondary | ICD-10-CM

## 2019-11-26 DIAGNOSIS — M21372 Foot drop, left foot: Secondary | ICD-10-CM | POA: Insufficient documentation

## 2019-11-26 DIAGNOSIS — G629 Polyneuropathy, unspecified: Secondary | ICD-10-CM | POA: Diagnosis not present

## 2019-11-26 DIAGNOSIS — Z87891 Personal history of nicotine dependence: Secondary | ICD-10-CM | POA: Insufficient documentation

## 2019-11-26 LAB — BASIC METABOLIC PANEL
Anion gap: 8 (ref 5–15)
BUN: 14 mg/dL (ref 8–23)
CO2: 26 mmol/L (ref 22–32)
Calcium: 8.8 mg/dL — ABNORMAL LOW (ref 8.9–10.3)
Chloride: 107 mmol/L (ref 98–111)
Creatinine, Ser: 0.63 mg/dL (ref 0.44–1.00)
GFR calc Af Amer: >60 mL/min (ref >60)
GFR calc non Af Amer: >60 mL/min (ref >60)
Glucose, Bld: 162 mg/dL — ABNORMAL HIGH (ref 70–99)
Potassium: 3.7 mmol/L (ref 3.5–5.1)
Sodium: 141 mmol/L (ref 135–145)

## 2019-11-26 LAB — CBC WITH DIFFERENTIAL/PLATELET
Abs Immature Granulocytes: 0.01 10*3/uL (ref 0.00–0.07)
Basophils Absolute: 0 10*3/uL (ref 0.0–0.1)
Basophils Relative: 1 %
Eosinophils Absolute: 0.2 10*3/uL (ref 0.0–0.5)
Eosinophils Relative: 5 %
HCT: 33.4 % — ABNORMAL LOW (ref 36.0–46.0)
Hemoglobin: 11.1 g/dL — ABNORMAL LOW (ref 12.0–15.0)
Immature Granulocytes: 0 %
Lymphocytes Relative: 6 %
Lymphs Abs: 0.2 10*3/uL — ABNORMAL LOW (ref 0.7–4.0)
MCH: 31.2 pg (ref 26.0–34.0)
MCHC: 33.2 g/dL (ref 30.0–36.0)
MCV: 93.8 fL (ref 80.0–100.0)
Monocytes Absolute: 0.6 10*3/uL (ref 0.1–1.0)
Monocytes Relative: 16 %
Neutro Abs: 2.6 10*3/uL (ref 1.7–7.7)
Neutrophils Relative %: 72 %
Platelets: 175 10*3/uL (ref 150–400)
RBC: 3.56 MIL/uL — ABNORMAL LOW (ref 3.87–5.11)
RDW: 13.4 % (ref 11.5–15.5)
WBC: 3.6 10*3/uL — ABNORMAL LOW (ref 4.0–10.5)
nRBC: 0 % (ref 0.0–0.2)

## 2019-11-26 LAB — LACTATE DEHYDROGENASE: LDH: 136 U/L (ref 98–192)

## 2019-11-26 MED ORDER — SODIUM CHLORIDE 0.9% FLUSH
10.0000 mL | Freq: Once | INTRAVENOUS | Status: AC
  Administered 2019-11-26: 10 mL via INTRAVENOUS
  Filled 2019-11-26: qty 10

## 2019-11-26 MED ORDER — HEPARIN SOD (PORK) LOCK FLUSH 100 UNIT/ML IV SOLN
500.0000 [IU] | Freq: Once | INTRAVENOUS | Status: AC
  Administered 2019-11-26: 500 [IU] via INTRAVENOUS
  Filled 2019-11-26: qty 5

## 2019-11-26 NOTE — Progress Notes (Signed)
[place] OFFICE PROGRESS NOTE  Patient Care Team: Rusty Aus, [name]  Cancer Staging No matching staging information was found for the patient.   Oncology History Overview Note  DEC 2017- DLBCL "TRIPLE HIT [myc/ bcl-2/bcl-6 gene rearrangement FISH]" ~10 cm mass RP LN; STAGE II [jan 2018- BMBx-NEG]; Jan 8th R-[facility];   # JAN 29th 2018- DA-R EPOCH x5 ; PET CR; s/p RT [last 03/18/2017]  # FEB-MARCH 2019- RECURRENCE of DLBCL [s/p RP Bx; 4 cm mass inferior to Left Kidney] ; II [facility]; Dr.Grover.   # April 3rd 2019- R-GDP [carbo]; MAY 2019- PET PR; NOV 2019- PR  #August 2020-recurrence bulky left pelvis/retroperitoneal [9-10 cm]; no biopsy. #August 24th-Rituxan weekly cycle #1; prednisone 60 mg a day.   06/03/2019 - Chemotherapy  BMT OP CAR-T LYMPHODEPLETION FLU/CY + IP AXICABTAGENE CILOLEUCEL (YESCARTA) Fludarabine 30 mg/m2 IV Days 1, 2, 3 Cyclophosphamide 500 mg/m2 IV Days 1, 2, 3 Axicabtagene ciloleucel target dose 2 x 10^6 CAR-positive viable T cells per kg body weight (maximum of 2 x 10^8 CAR-positive viable T cells) on Day 6.  # SEP 2020- 74month PET-[facility]- significant PR.   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    05/07/2019 -  Chemotherapy   The patient had riTUXimab-hyaluronidase human (RITUXAN HYCELA) 1400-23400 MG  -UT/11.7ML injection SQ 1,400 mg, 1,400 mg, Subcutaneous,  Once, 1 of 4 cycles Administration: 1,400 mg (05/07/2019)  for chemotherapy treatment.        INTERVAL HISTORY:  [place] 74 y.o.  female pleasant patient above history of recurrent diffuse large B-cell lymphoma is here for follow-up patient currently status post car T-cell therapy in mid October 2020.  In the interim patient had a PET scan/follow-up at [facility] March 31.   Denies any new lumps or bumps.  Appetite is good.  Chronic mild fatigue.  Chronic mild nausea.  Review of Systems  Constitutional: Positive for malaise/fatigue. Negative for chills, diaphoresis, fever and weight loss.  HENT: Negative for nosebleeds and sore throat.   Eyes: Negative for double vision.  Respiratory: Negative for cough, hemoptysis, sputum production, shortness of breath and wheezing.   Cardiovascular: Negative for chest pain, palpitations, orthopnea and leg swelling.  Gastrointestinal: Negative for abdominal pain, blood in stool, constipation, diarrhea, melena and vomiting.  Genitourinary: Negative for dysuria, frequency and urgency.  Musculoskeletal: Positive for joint pain.  Skin: Negative.  Negative for itching and rash.  Neurological: Positive for tingling. Negative for focal weakness and weakness.  Endo/Heme/Allergies: Does not bruise/bleed easily.  Psychiatric/Behavioral: Negative for depression. The patient is not nervous/anxious and does not have insomnia.     PAST MEDICAL HISTORY :  Past Medical History:  Diagnosis Date  . Diabetes mellitus without complication ([place])   . Diffuse large B-cell lymphoma (H[facility])    Chemo + rad tx's  . Heart murmur   . History of chemotherapy 07/08/2017  . History of gastric ulcer   . History of radiation therapy 07/08/2017  .  Hypercholesterolemia   . Hypertension   . ILD (interstitial lung disease) ([place])    8 yrs ago  . Lymphadenopathy     PAST SURGICAL HISTORY :   Past Surgical History:   Procedure Laterality Date  . BREAST BIOPSY Left 2010   neg  . CATARACT EXTRACTION W/PHACO Left 10/16/2017   Procedure: CATARACT EXTRACTION PHACO AND INTRAOCULAR LENS PLACEMENT ([place]) COMPLICATED DIABETIC LEFT;  Surgeon: Leandrew Koyanagi, MD;  Location: [place];  Service: Ophthalmology;  Laterality: Left;  IRIS HOOKS Diabetic - oral meds  . HERNIA REPAIR    . PARTIAL HYSTERECTOMY     age 25  . PERIPHERAL VASCULAR CATHETERIZATION N/A 08/22/2016   Procedure: [name];  Surgeon: [name], MD;  Location: [place] [facility];  Service: Cardiovascular;  Laterality: N/A;  . TONSILLECTOMY      FAMILY HISTORY :   Family History  Problem Relation Age of Onset  . Heart disease Mother   . Heart disease Father   . Heart disease Brother     SOCIAL HISTORY:   Social History   Tobacco Use  . Smoking status: Former Smoker    Quit date: 08/21/1958    Years since quitting: 61.3  . Smokeless tobacco: Never Used  Substance Use Topics  . Alcohol use: No  . Drug use: No    ALLERGIES:  is allergic to levaquin [levofloxacin in d5w]; cefuroxime axetil; and doxycycline.  MEDICATIONS:  Current Outpatient Medications  Medication Sig Dispense Refill  . b complex vitamins tablet Take 1 tablet by mouth daily.    Marland Kitchen dicyclomine (BENTYL) 10 MG capsule Take by mouth.    . gabapentin (NEURONTIN) 300 MG capsule Take 1 capsule (300 mg total) by mouth 2 (two) times daily. (Patient taking differently: Take 300 mg by mouth 3 (three) times daily. 1 in am and 2 at night) 60 capsule 3  . latanoprost (XALATAN) 0.005 % ophthalmic solution Place 1 drop into both eyes at bedtime.     . lidocaine-prilocaine (EMLA) cream Apply cream 1 hour before chemotherapy treatment, place small amount of saran wrap over cream to protect clothing. 30 g 1  . Magnesium 400 MG TABS Take 1 tablet by mouth daily.    . metFORMIN (GLUCOPHAGE) 500 MG tablet Take 250 mg by mouth daily with breakfast.     .  metoprolol tartrate (LOPRESSOR) 25 MG tablet TAKE 1 TABLET BY MOUTH TWICE DAILY (Patient taking differently: 12.5 mg 2 (two) times daily. (BETA BLOCKER)) 180 tablet 0  . montelukast (SINGULAIR) 10 MG tablet One a day. (Patient not taking: Reported on 05/01/2019) 30 tablet 11  . ondansetron (ZOFRAN) 8 MG tablet TAKE 1 TABLET BY MOUTH EVERY 8 HOURS AS NEEDED FOR NAUSEA AND VOMITING 40 tablet 3  . pantoprazole (PROTONIX) 40 MG tablet Take 40 mg by mouth daily.     . pravastatin (PRAVACHOL) 40 MG tablet Take 1 tablet by mouth daily.    . prochlorperazine (COMPAZINE) 10 MG tablet Take 1 tablet (10 mg total) by mouth every 6 (six) hours as needed for nausea, vomiting or refractory nausea / vomiting. 40 tablet 0  . sulfamethoxazole-trimethoprim (BACTRIM DS) 800-160 MG tablet Take 1 tablet by mouth 3 (three) times a week.    . traMADol (ULTRAM) 50 MG tablet Take 50 mg by mouth every 6 (six) hours as needed.    . valACYclovir (VALTREX) 500 MG tablet Take 1 tablet by mouth every morning.     No current facility-administered medications  for this visit.   Facility-Administered Medications Ordered in Other Visits  Medication Dose Route Frequency Provider Last Rate Last Admin  . 0.9 %  sodium chloride infusion   Intravenous Continuous Cammie Sickle, MD 10 mL/hr at 12/05/16 1030 New Bag at 12/05/16 1030  . 0.9 %  sodium chloride infusion   Intravenous Continuous Cammie Sickle, MD   Stopped at 12/26/16 1011  . 0.9 %  sodium chloride infusion   Intravenous Continuous Charlaine Dalton R, MD 10 mL/hr at 12/27/16 0930 New Bag at 12/27/16 0930  . heparin lock flush 100 unit/mL  500 Units Intravenous Once Charlaine Dalton R, MD      . heparin lock flush 100 unit/mL  500 Units Intravenous Once Charlaine Dalton R, MD      . sodium chloride flush (NS) 0.9 % injection 10 mL  10 mL Intravenous PRN Cammie Sickle, MD   10 mL at 11/06/16 1000  . sodium chloride flush (NS) 0.9 % injection 10 mL   10 mL Intravenous PRN Cammie Sickle, MD   10 mL at 11/07/16 0905  . sodium chloride flush (NS) 0.9 % injection 10 mL  10 mL Intravenous PRN Charlaine Dalton R, MD      . sodium chloride flush (NS) 0.9 % injection 10 mL  10 mL Intravenous PRN Cammie Sickle, MD   10 mL at 12/05/16 1030  . sodium chloride flush (NS) 0.9 % injection 10 mL  10 mL Intravenous PRN Cammie Sickle, MD   10 mL at 12/27/16 0930    PHYSICAL EXAMINATION: ECOG PERFORMANCE STATUS: 1 - Symptomatic but completely ambulatory  BP (!) 134/51   Pulse 67   Temp 98.7 F (37.1 C) (Tympanic)   Resp 20   Wt 146 lb (66.2 kg)   BMI 22.87 kg/m   Filed Weights   11/26/19 1028  Weight: 146 lb (66.2 kg)    Physical Exam  Constitutional: She is oriented to person, place, and time and well-developed, well-nourished, and in no distress.  She is alone.  Walking independently.  HENT:  Head: Normocephalic and atraumatic.  Mouth/Throat: Oropharynx is clear and moist. No oropharyngeal exudate.  Eyes: Pupils are equal, round, and reactive to light.  Cardiovascular: Normal rate and regular rhythm.  Pulmonary/Chest: No respiratory distress. She has no wheezes.  Positive for crackles bilateral lung fields at the bases.  Abdominal: Soft. Bowel sounds are normal. She exhibits no distension and no mass. There is no abdominal tenderness. There is no rebound and no guarding.  Musculoskeletal:        General: No tenderness or edema. Normal range of motion.     Cervical back: Normal range of motion and neck supple.  Neurological: She is alert and oriented to person, place, and time.  Skin: Skin is warm.  Psychiatric: Affect normal.   LABORATORY DATA:  I have reviewed the data as listed    Component Value Date/Time   NA 141 11/26/2019 0954   K 3.7 11/26/2019 0954   K 4.1 07/01/2014 1429   CL 107 11/26/2019 0954   CO2 26 11/26/2019 0954   GLUCOSE 162 (H) 11/26/2019 0954   BUN 14 11/26/2019 0954   CREATININE  0.63 11/26/2019 0954   CALCIUM 8.8 (L) 11/26/2019 0954   PROT 6.2 (L) 07/29/2019 0953   ALBUMIN 3.9 07/29/2019 0953   AST 15 07/29/2019 0953   ALT 12 07/29/2019 0953   ALKPHOS 46 07/29/2019 0953   BILITOT 0.4 07/29/2019 [id number]  GFRNONAA >60 11/26/2019 0954   GFRAA >60 11/26/2019 0954    No results found for: SPEP, UPEP  Lab Results  Component Value Date   WBC 3.6 (L) 11/26/2019   NEUTROABS 2.6 11/26/2019   HGB 11.1 (L) 11/26/2019   HCT 33.4 (L) 11/26/2019   MCV 93.8 11/26/2019   PLT 175 11/26/2019      Chemistry      Component Value Date/Time   NA 141 11/26/2019 0954   K 3.7 11/26/2019 0954   K 4.1 07/01/2014 1429   CL 107 11/26/2019 0954   CO2 26 11/26/2019 0954   BUN 14 11/26/2019 0954   CREATININE 0.63 11/26/2019 0954      Component Value Date/Time   CALCIUM 8.8 (L) 11/26/2019 0954   ALKPHOS 46 07/29/2019 0953   AST 15 07/29/2019 0953   ALT 12 07/29/2019 0953   BILITOT 0.4 07/29/2019 0953       RADIOGRAPHIC STUDIES: I have personally reviewed the radiological images as listed and agreed with the findings in the report. No results found.   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  Reviewed the PET scan with the patient.  #No clinical evidence of progression at this time.  Stable. Awaiting repeat PET on June 4th.   # Prophylactic antibiotics with Valtrex and Bactrim.  #Peripheral neuropathy/left foot drop- STABLE.   # ILD- STABLE.   # DISPOSITION:  # follow up in 4 month 2021-MD; labs- cbc/bmp/LDH; port flush- Dr.B   Orders Placed This Encounter  Procedures  . CBC with Differential    Standing Status:   Future    Standing Expiration Date:   11/25/2020  .  Basic metabolic panel    Standing Status:   Future    Standing Expiration Date:   11/25/2020  . Lactate dehydrogenase    Standing Status:   Future    Standing Expiration Date:   11/25/2020   All questions were answered. The patient knows to call the clinic with any problems, questions or concerns.      Cammie Sickle, MD 11/26/2019 12:08 PM

## 2019-11-26 NOTE — Assessment & Plan Note (Addendum)
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  Reviewed the PET scan with the patient.  #No clinical evidence of progression at this time.  Stable. Awaiting repeat PET on June 4th.   # Prophylactic antibiotics with Valtrex and Bactrim.  #Peripheral neuropathy/left foot drop- STABLE.   # ILD- STABLE.   # DISPOSITION:  # follow up in 4 month 2021-MD; labs- cbc/bmp/LDH; port flush- Dr.B

## 2020-03-14 ENCOUNTER — Other Ambulatory Visit: Payer: Self-pay

## 2020-03-14 ENCOUNTER — Ambulatory Visit: Payer: Medicare PPO | Admitting: Dermatology

## 2020-03-14 DIAGNOSIS — Z1283 Encounter for screening for malignant neoplasm of skin: Secondary | ICD-10-CM

## 2020-03-14 DIAGNOSIS — I8393 Asymptomatic varicose veins of bilateral lower extremities: Secondary | ICD-10-CM | POA: Diagnosis not present

## 2020-03-14 DIAGNOSIS — D1800 Hemangioma unspecified site: Secondary | ICD-10-CM

## 2020-03-14 DIAGNOSIS — D225 Melanocytic nevi of trunk: Secondary | ICD-10-CM | POA: Diagnosis not present

## 2020-03-14 DIAGNOSIS — L720 Epidermal cyst: Secondary | ICD-10-CM | POA: Diagnosis not present

## 2020-03-14 DIAGNOSIS — D229 Melanocytic nevi, unspecified: Secondary | ICD-10-CM | POA: Diagnosis not present

## 2020-03-14 DIAGNOSIS — D239 Other benign neoplasm of skin, unspecified: Secondary | ICD-10-CM

## 2020-03-14 DIAGNOSIS — D692 Other nonthrombocytopenic purpura: Secondary | ICD-10-CM

## 2020-03-14 DIAGNOSIS — D485 Neoplasm of uncertain behavior of skin: Secondary | ICD-10-CM

## 2020-03-14 DIAGNOSIS — L578 Other skin changes due to chronic exposure to nonionizing radiation: Secondary | ICD-10-CM

## 2020-03-14 DIAGNOSIS — L821 Other seborrheic keratosis: Secondary | ICD-10-CM

## 2020-03-14 DIAGNOSIS — L814 Other melanin hyperpigmentation: Secondary | ICD-10-CM

## 2020-03-14 HISTORY — DX: Other benign neoplasm of skin, unspecified: D23.9

## 2020-03-14 NOTE — Progress Notes (Signed)
New Patient Visit  Subjective  Rose Hill is a 74 y.o. female who presents for the following: Annual Exam (No history of skin cancer - TBSE today). The patient presents for Total-Body Skin Exam (TBSE) for skin cancer screening and mole check.  The following portions of the chart were reviewed this encounter and updated as appropriate:  Tobacco  Allergies  Meds  Problems  Med Hx  Surg Hx  Fam Hx     Review of Systems:  No other skin or systemic complaints except as noted in HPI or Assessment and Plan.  Objective  Well appearing patient in no apparent distress; mood and affect are within normal limits.  A full examination was performed including scalp, head, eyes, ears, nose, lips, neck, chest, axillae, abdomen, back, buttocks, bilateral upper extremities, bilateral lower extremities, hands, feet, fingers, toes, fingernails, and toenails. All findings within normal limits unless otherwise noted below.  Objective  Right Postauricular Area: 0.3 cm Subcutaneous nodule.   Objective  Left inf med scapula: 0.5 cm dark brown macule  Objective  Left lat breast parallel to areola: 1.1 cm irregular brown macule  Objective  Left lat breast: Slightly irregular brown macule.   Assessment & Plan   . Purpura - Violaceous macules and patches - Benign - Related to age, sun damage and/or use of blood thinners - Observe - Can use OTC arnica containing moisturizer such as Dermend Bruise Formula if desired - Call for worsening or other concern.  Lentigines - Scattered tan macules - Discussed due to sun exposure - Benign, observe - Call for any changes  Seborrheic Keratoses - Stuck-on, waxy, tan-brown papules and plaques  - Discussed benign etiology and prognosis. - Observe - Call for any changes  Melanocytic Nevi - Tan-brown and/or pink-flesh-colored symmetric macules and papules - Benign appearing on exam today - Observation - Call clinic for new or changing moles -  Recommend daily use of broad spectrum spf 30+ sunscreen to sun-exposed areas.   Hemangiomas - Red papules - Discussed benign nature - Observe - Call for any changes  Actinic Damage - diffuse scaly erythematous macules with underlying dyspigmentation - Recommend daily broad spectrum sunscreen SPF 30+ to sun-exposed areas, reapply every 2 hours as needed.  - Call for new or changing lesions.  Skin cancer screening performed today.  Epidermal inclusion cyst Right Postauricular Area Benign. Discussed excision.  Asymptomatic varicose veins of both lower extremities Left Lower Leg - Anterior Graduated knee high compression stockings  Neoplasm of uncertain behavior of skin (2) Left inf med scapula  Skin / nail biopsy Type of biopsy: tangential   Informed consent: discussed and consent obtained   Timeout: patient name, date of birth, surgical site, and procedure verified   Procedure prep:  Patient was prepped and draped in usual sterile fashion Prep type:  Isopropyl alcohol Anesthesia: the lesion was anesthetized in a standard fashion   Anesthetic:  1% lidocaine w/ epinephrine 1-100,000 buffered w/ 8.4% NaHCO3 Instrument used: flexible razor blade   Hemostasis achieved with: pressure, aluminum chloride and electrodesiccation   Outcome: patient tolerated procedure well   Post-procedure details: sterile dressing applied and wound care instructions given   Dressing type: bandage and petrolatum    Specimen 1 - Surgical pathology Differential Diagnosis: Nevus vs dysplastic nevus Check Margins: No 0.5 cm dark brown macule  Left lat breast parallel to areola  Epidermal / dermal shaving  Lesion diameter (cm):  1.1 Informed consent: discussed and consent obtained   Timeout:  patient name, date of birth, surgical site, and procedure verified   Procedure prep:  Patient was prepped and draped in usual sterile fashion Prep type:  Isopropyl alcohol Anesthesia: the lesion was  anesthetized in a standard fashion   Anesthetic:  1% lidocaine w/ epinephrine 1-100,000 buffered w/ 8.4% NaHCO3 Instrument used: flexible razor blade   Hemostasis achieved with: pressure, aluminum chloride and electrodesiccation   Outcome: patient tolerated procedure well   Post-procedure details: sterile dressing applied and wound care instructions given   Dressing type: bandage and petrolatum    Specimen 2 - Surgical pathology Differential Diagnosis: Nevus vs dysplastic nevus Check Margins: No 1.1 cm irregular brown macule  Nevus Left lat breast  Pending today's biopsy results, may consider biopsy in the future.  Return in about 1 year (around 03/14/2021) for TBSE.  I, Ashok Cordia, CMA, am acting as scribe for Sarina Ser, MD .  Documentation: I have reviewed the above documentation for accuracy and completeness, and I agree with the above.  Sarina Ser, MD

## 2020-03-14 NOTE — Patient Instructions (Signed)

## 2020-03-15 ENCOUNTER — Encounter: Payer: Self-pay | Admitting: Dermatology

## 2020-03-17 ENCOUNTER — Telehealth: Payer: Self-pay

## 2020-03-17 NOTE — Telephone Encounter (Signed)
Advised patient of results.  

## 2020-03-17 NOTE — Telephone Encounter (Signed)
-----   Message from Ralene Bathe, MD sent at 03/17/2020 10:39 AM EDT ----- 1. Skin , left inf med scapula DYSPLASTIC COMPOUND NEVUS WITH MODERATE ATYPIA, LATERAL MARGIN INVOLVED 2. Skin , left lat breast parallel to areaola DYSPLASTIC COMPOUND NEVUS WITH MODERATE ATYPIA, LIMITED MARGINS FREE  1&2 - both dysplastic Moderate Recheck next visit.  If not scheduled by 1 year, make appt

## 2020-03-28 ENCOUNTER — Inpatient Hospital Stay: Payer: Medicare PPO | Attending: Internal Medicine

## 2020-03-28 ENCOUNTER — Inpatient Hospital Stay: Payer: Medicare PPO | Admitting: Internal Medicine

## 2020-03-28 ENCOUNTER — Other Ambulatory Visit: Payer: Self-pay

## 2020-03-28 ENCOUNTER — Encounter: Payer: Self-pay | Admitting: Internal Medicine

## 2020-03-28 DIAGNOSIS — I10 Essential (primary) hypertension: Secondary | ICD-10-CM | POA: Insufficient documentation

## 2020-03-28 DIAGNOSIS — Z8572 Personal history of non-Hodgkin lymphomas: Secondary | ICD-10-CM | POA: Diagnosis not present

## 2020-03-28 DIAGNOSIS — C8333 Diffuse large B-cell lymphoma, intra-abdominal lymph nodes: Secondary | ICD-10-CM

## 2020-03-28 DIAGNOSIS — Z8249 Family history of ischemic heart disease and other diseases of the circulatory system: Secondary | ICD-10-CM | POA: Insufficient documentation

## 2020-03-28 DIAGNOSIS — Z87891 Personal history of nicotine dependence: Secondary | ICD-10-CM | POA: Insufficient documentation

## 2020-03-28 DIAGNOSIS — E7800 Pure hypercholesterolemia, unspecified: Secondary | ICD-10-CM | POA: Insufficient documentation

## 2020-03-28 DIAGNOSIS — Z9221 Personal history of antineoplastic chemotherapy: Secondary | ICD-10-CM | POA: Insufficient documentation

## 2020-03-28 DIAGNOSIS — E1140 Type 2 diabetes mellitus with diabetic neuropathy, unspecified: Secondary | ICD-10-CM | POA: Diagnosis not present

## 2020-03-28 DIAGNOSIS — Z79899 Other long term (current) drug therapy: Secondary | ICD-10-CM | POA: Diagnosis not present

## 2020-03-28 LAB — CBC WITH DIFFERENTIAL/PLATELET
Abs Immature Granulocytes: 0.02 10*3/uL (ref 0.00–0.07)
Basophils Absolute: 0 10*3/uL (ref 0.0–0.1)
Basophils Relative: 1 %
Eosinophils Absolute: 0.3 10*3/uL (ref 0.0–0.5)
Eosinophils Relative: 7 %
HCT: 33.6 % — ABNORMAL LOW (ref 36.0–46.0)
Hemoglobin: 11.1 g/dL — ABNORMAL LOW (ref 12.0–15.0)
Immature Granulocytes: 1 %
Lymphocytes Relative: 8 %
Lymphs Abs: 0.3 10*3/uL — ABNORMAL LOW (ref 0.7–4.0)
MCH: 30.5 pg (ref 26.0–34.0)
MCHC: 33 g/dL (ref 30.0–36.0)
MCV: 92.3 fL (ref 80.0–100.0)
Monocytes Absolute: 0.6 10*3/uL (ref 0.1–1.0)
Monocytes Relative: 16 %
Neutro Abs: 2.6 10*3/uL (ref 1.7–7.7)
Neutrophils Relative %: 67 %
Platelets: 180 10*3/uL (ref 150–400)
RBC: 3.64 MIL/uL — ABNORMAL LOW (ref 3.87–5.11)
RDW: 13.5 % (ref 11.5–15.5)
WBC: 3.8 10*3/uL — ABNORMAL LOW (ref 4.0–10.5)
nRBC: 0 % (ref 0.0–0.2)

## 2020-03-28 LAB — BASIC METABOLIC PANEL
Anion gap: 8 (ref 5–15)
BUN: 17 mg/dL (ref 8–23)
CO2: 27 mmol/L (ref 22–32)
Calcium: 8.5 mg/dL — ABNORMAL LOW (ref 8.9–10.3)
Chloride: 105 mmol/L (ref 98–111)
Creatinine, Ser: 0.6 mg/dL (ref 0.44–1.00)
GFR calc Af Amer: >60 mL/min (ref >60)
GFR calc non Af Amer: >60 mL/min (ref >60)
Glucose, Bld: 93 mg/dL (ref 70–99)
Potassium: 4 mmol/L (ref 3.5–5.1)
Sodium: 140 mmol/L (ref 135–145)

## 2020-03-28 LAB — LACTATE DEHYDROGENASE: LDH: 158 U/L (ref 98–192)

## 2020-03-28 MED ORDER — HEPARIN SOD (PORK) LOCK FLUSH 100 UNIT/ML IV SOLN
500.0000 [IU] | Freq: Once | INTRAVENOUS | Status: AC
  Administered 2020-03-28: 500 [IU] via INTRAVENOUS
  Filled 2020-03-28: qty 5

## 2020-03-28 MED ORDER — SODIUM CHLORIDE 0.9% FLUSH
10.0000 mL | Freq: Once | INTRAVENOUS | Status: AC
  Administered 2020-03-28: 10 mL via INTRAVENOUS
  Filled 2020-03-28: qty 10

## 2020-03-28 MED ORDER — HEPARIN SOD (PORK) LOCK FLUSH 100 UNIT/ML IV SOLN
INTRAVENOUS | Status: AC
  Filled 2020-03-28: qty 5

## 2020-03-28 NOTE — Assessment & Plan Note (Addendum)
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  Reviewed the PET scan with the patient.  #No clinical evidence of progression at this time.  STABLE; await repeat imaging in September.  # Hypogammaglobiemia-asymptomatic.  Montor for nbow.   # Prophylactic antibiotics with Valtrex and Bactrim.  #Peripheral neuropathy/left foot drop- STABLE>    # ILD-stable.  # DISPOSITION:  # follow up in 3 month 2021-MD; labs- cbc/bmp/LDH; port flush- Dr.B

## 2020-03-28 NOTE — Progress Notes (Signed)
[place] OFFICE PROGRESS NOTE  Patient Care Team: Rusty Aus, [name]  Cancer Staging No matching staging information was found for the patient.   Oncology History Overview Note  DEC 2017- DLBCL "TRIPLE HIT [myc/ bcl-2/bcl-6 gene rearrangement FISH]" ~10 cm mass RP LN; STAGE II [jan 2018- BMBx-NEG]; Jan 8th R-[facility];   # JAN 29th 2018- DA-R EPOCH x5 ; PET CR; s/p RT [last 03/18/2017]  # FEB-MARCH 2019- RECURRENCE of DLBCL [s/p RP Bx; 4 cm mass inferior to Left Kidney] ; II [facility]; Dr.Grover.   # April 3rd 2019- R-[facility] [carbo]; MAY 2019- PET PR; NOV 2019- PR  #August 2020-recurrence bulky left pelvis/retroperitoneal [9-10 cm]; no biopsy. #August 24th-Rituxan weekly cycle #1; prednisone 60 mg a day.   06/03/2019 - Chemotherapy  BMT OP CAR-T LYMPHODEPLETION FLU/CY + IP AXICABTAGENE CILOLEUCEL (YESCARTA) Fludarabine 30 mg/m2 IV Days 1, 2, 3 Cyclophosphamide 500 mg/m2 IV Days 1, 2, 3 Axicabtagene ciloleucel target dose 2 x 10^6 CAR-positive viable T cells per kg body weight (maximum of 2 x 10^8 CAR-positive viable T cells) on Day 6.  # SEP 2020- 68month PET-[facility]- significant PR.   #August 2020-2D echo ejection fraction 60 to 65%  --------------------------------  # JAN 26th-LP  # Interstitial Lung disease [surveillance] --------------------------------------------------------------------------  DIAGNOSIS: [name] 2019 ] RECURRENT DLBCL  STAGE:   Recurrent ; GOALS: CURATIVE  CURRENT/MOST RECENT THERAPY: surveillaince   Diffuse large B-cell lymphoma of intra-abdominal lymph nodes ([place])  04/13/2019 - 04/19/2019 Chemotherapy   The patient had riTUXimab-pvvr (RUXIENCE) 700 mg in sodium chloride 0.9 % 250 mL chemo infusion, , Intravenous, Once, 1 of 4 cycles Administration:  (04/13/2019)  for chemotherapy treatment.    05/07/2019 -  Chemotherapy   The patient had riTUXimab-hyaluronidase human (RITUXAN HYCELA) 1400-23400 MG  -UT/11.7ML injection SQ 1,400 mg, 1,400 mg, Subcutaneous,  Once, 1 of 4 cycles Administration: 1,400 mg (05/07/2019)  for chemotherapy treatment.        INTERVAL HISTORY:  Rose Hill 74 y.o.  female pleasant patient above history of recurrent diffuse large B-cell lymphoma is here for follow-up patient currently status post car T-cell therapy in mid October 2020.  In the interim patient had a PET scan/follow-up at [facility] June 2nd, 2021.  Patient denies any new lumps or bumps.  Appetite is good.  Chronic mild to moderate fatigue.  No nausea vomiting.  No headaches.  Chronic shortness of breath on exertion.   Review of Systems  Constitutional: Positive for malaise/fatigue. Negative for chills, diaphoresis, fever and weight loss.  HENT: Negative for nosebleeds and sore throat.   Eyes: Negative for double vision.  Respiratory: Positive for shortness of breath. Negative for cough, hemoptysis, sputum production and wheezing.   Cardiovascular: Negative for chest pain, palpitations, orthopnea and leg swelling.  Gastrointestinal: Negative for abdominal pain, blood in stool, constipation, diarrhea, melena and vomiting.  Genitourinary: Negative for dysuria, frequency and urgency.  Musculoskeletal: Positive for joint pain.  Skin: Negative.  Negative for itching and rash.  Neurological: Positive for tingling. Negative for focal weakness and weakness.  Endo/Heme/Allergies: Does not bruise/bleed easily.  Psychiatric/Behavioral: Negative for depression. The patient is not nervous/anxious and does not have insomnia.     PAST MEDICAL HISTORY :  Past Medical History:  Diagnosis Date  . Diabetes mellitus without complication ([place])   . Diffuse large B-cell lymphoma ([facility])    Chemo + rad tx's  . Heart murmur   . History of chemotherapy 07/08/2017  .  History of gastric ulcer   . History of radiation therapy 07/08/2017  . Hypercholesterolemia   . Hypertension   . ILD (interstitial lung disease) ([place])     8 yrs ago  . Lymphadenopathy     PAST SURGICAL HISTORY :   Past Surgical History:  Procedure Laterality Date  . BREAST BIOPSY Left 2010   neg  . CATARACT EXTRACTION W/PHACO Left 10/16/2017   Procedure: CATARACT EXTRACTION PHACO AND INTRAOCULAR LENS PLACEMENT ([place]) COMPLICATED DIABETIC LEFT;  Surgeon: Leandrew Koyanagi, MD;  Location: [place];  Service: Ophthalmology;  Laterality: Left;  IRIS HOOKS Diabetic - oral meds  . HERNIA REPAIR    . PARTIAL HYSTERECTOMY     age 89  . PERIPHERAL VASCULAR CATHETERIZATION N/A 08/22/2016   Procedure: [name];  Surgeon: [name], MD;  Location: [place] CV LAB;  Service: Cardiovascular;  Laterality: N/A;  . TONSILLECTOMY      FAMILY HISTORY :   Family History  Problem Relation Age of Onset  . Heart disease Mother   . Heart disease Father   . Heart disease Brother     SOCIAL HISTORY:   Social History   Tobacco Use  . Smoking status: Former Smoker    Quit date: 08/21/1958    Years since quitting: 61.6  . Smokeless tobacco: Never Used  Vaping Use  . Vaping Use: Never used  Substance Use Topics  . Alcohol use: No  . Drug use: No    ALLERGIES:  is allergic to levaquin [levofloxacin in d5w], cefuroxime axetil, and doxycycline.  MEDICATIONS:  Current Outpatient Medications  Medication Sig Dispense Refill  . b complex vitamins tablet Take 1 tablet by mouth daily.    Marland Kitchen gabapentin (NEURONTIN) 300 MG capsule Take 1 capsule (300 mg total) by mouth 2 (two) times daily. (Patient taking differently: Take 300 mg by mouth 3 (three) times daily. 1 in am and 2 at night) 60 capsule 3  . latanoprost (XALATAN) 0.005 % ophthalmic solution Place 1 drop into both eyes at bedtime.     . lidocaine-prilocaine (EMLA) cream Apply cream 1 hour before chemotherapy treatment, place small amount of saran wrap over cream to protect clothing. 30 g 1  . Magnesium 400 MG TABS Take 1 tablet by mouth daily.    . metoprolol  tartrate (LOPRESSOR) 25 MG tablet TAKE 1 TABLET BY MOUTH TWICE DAILY (Patient taking differently: 12.5 mg 2 (two) times daily. (BETA BLOCKER)) 180 tablet 0  . montelukast (SINGULAIR) 10 MG tablet One a day. 30 tablet 11  . pantoprazole (PROTONIX) 40 MG tablet Take 1 tablet by mouth in the morning and at bedtime.    . pravastatin (PRAVACHOL) 40 MG tablet Take 40 mg by mouth daily.    Marland Kitchen sulfamethoxazole-trimethoprim (BACTRIM DS) 800-160 MG tablet Take 1 tablet by mouth 3 (three) times a week.    . valACYclovir (VALTREX) 500 MG tablet Take 1 tablet by mouth every morning.    . magnesium oxide (MAG-OX) 400 MG tablet Take 1 tablet by mouth daily.    . traMADol (ULTRAM) 50 MG tablet Take 50 mg by mouth every 6 (six) hours as needed. (Patient not taking: Reported on 03/28/2020)     No current facility-administered medications for this visit.   Facility-Administered Medications Ordered in Other Visits  Medication Dose Route Frequency Provider Last Rate Last Admin  . 0.9 %  sodium chloride infusion   Intravenous Continuous Cammie Sickle, MD 10 mL/hr at 12/05/16 1030 New  Bag at 12/05/16 1030  . 0.9 %  sodium chloride infusion   Intravenous Continuous Cammie Sickle, MD   Stopped at 12/26/16 1011  . 0.9 %  sodium chloride infusion   Intravenous Continuous Charlaine Dalton R, MD 10 mL/hr at 12/27/16 0930 New Bag at 12/27/16 0930  . heparin lock flush 100 unit/mL  500 Units Intravenous Once Charlaine Dalton R, MD      . heparin lock flush 100 unit/mL  500 Units Intravenous Once Charlaine Dalton R, MD      . sodium chloride flush (NS) 0.9 % injection 10 mL  10 mL Intravenous PRN Cammie Sickle, MD   10 mL at 11/06/16 1000  . sodium chloride flush (NS) 0.9 % injection 10 mL  10 mL Intravenous PRN Cammie Sickle, MD   10 mL at 11/07/16 0905  . sodium chloride flush (NS) 0.9 % injection 10 mL  10 mL Intravenous PRN Charlaine Dalton R, MD      . sodium chloride flush (NS)  0.9 % injection 10 mL  10 mL Intravenous PRN Cammie Sickle, MD   10 mL at 12/05/16 1030  . sodium chloride flush (NS) 0.9 % injection 10 mL  10 mL Intravenous PRN Cammie Sickle, MD   10 mL at 12/27/16 0930    PHYSICAL EXAMINATION: ECOG PERFORMANCE STATUS: 1 - Symptomatic but completely ambulatory  BP (!) 133/55   Pulse 72   Temp 97.7 F (36.5 C) (Tympanic)   Resp 20   There were no vitals filed for this visit.  Physical Exam Constitutional:      Comments: She is alone.  Walking independently.  HENT:     Head: Normocephalic and atraumatic.     Mouth/Throat:     Pharynx: No oropharyngeal exudate.  Eyes:     Pupils: Pupils are equal, round, and reactive to light.  Cardiovascular:     Rate and Rhythm: Normal rate and regular rhythm.  Pulmonary:     Effort: No respiratory distress.     Breath sounds: No wheezing.     Comments: Bilateral crackles at the bases.  No wheeze. Abdominal:     General: Bowel sounds are normal. There is no distension.     Palpations: Abdomen is soft. There is no mass.     Tenderness: There is no abdominal tenderness. There is no guarding or rebound.  Musculoskeletal:        General: No tenderness. Normal range of motion.     Cervical back: Normal range of motion and neck supple.  Skin:    General: Skin is warm.  Neurological:     Mental Status: She is alert and oriented to person, place, and time.  Psychiatric:        Mood and Affect: Affect normal.    LABORATORY DATA:  I have reviewed the data as listed    Component Value Date/Time   NA 140 03/28/2020 1305   K 4.0 03/28/2020 1305   K 4.1 07/01/2014 1429   CL 105 03/28/2020 1305   CO2 27 03/28/2020 1305   GLUCOSE 93 03/28/2020 1305   BUN 17 03/28/2020 1305   CREATININE 0.60 03/28/2020 1305   CALCIUM 8.5 (L) 03/28/2020 1305   PROT 6.2 (L) 07/29/2019 0953   ALBUMIN 3.9 07/29/2019 0953   AST 15 07/29/2019 0953   ALT 12 07/29/2019 0953   ALKPHOS 46 07/29/2019 0953    BILITOT 0.4 07/29/2019 0953   GFRNONAA >60 03/28/2020 1305  GFRAA >60 03/28/2020 1305    No results found for: SPEP, UPEP  Lab Results  Component Value Date   WBC 3.8 (L) 03/28/2020   NEUTROABS 2.6 03/28/2020   HGB 11.1 (L) 03/28/2020   HCT 33.6 (L) 03/28/2020   MCV 92.3 03/28/2020   PLT 180 03/28/2020      Chemistry      Component Value Date/Time   NA 140 03/28/2020 1305   K 4.0 03/28/2020 1305   K 4.1 07/01/2014 1429   CL 105 03/28/2020 1305   CO2 27 03/28/2020 1305   BUN 17 03/28/2020 1305   CREATININE 0.60 03/28/2020 1305      Component Value Date/Time   CALCIUM 8.5 (L) 03/28/2020 1305   ALKPHOS 46 07/29/2019 0953   AST 15 07/29/2019 0953   ALT 12 07/29/2019 0953   BILITOT 0.4 07/29/2019 0953       RADIOGRAPHIC STUDIES: I have personally reviewed the radiological images as listed and agreed with the findings in the report. No results found.   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  Reviewed the PET scan with the patient.  #No clinical evidence of progression at this time.  STABLE; await repeat imaging in September.  # Hypogammaglobiemia-asymptomatic.  Montor for nbow.   # Prophylactic antibiotics with Valtrex and Bactrim.  #Peripheral neuropathy/left foot drop- STABLE>    # ILD-stable.  # DISPOSITION:  # follow up in 3 month 2021-MD; labs- cbc/bmp/LDH; port flush- Dr.B   No orders of the defined types were placed in this encounter.  All questions were answered. The patient knows to call the clinic with any problems, questions or concerns.      Cammie Sickle, MD 03/28/2020 1:57 PM

## 2020-06-27 ENCOUNTER — Other Ambulatory Visit: Payer: Self-pay

## 2020-06-27 DIAGNOSIS — C8333 Diffuse large B-cell lymphoma, intra-abdominal lymph nodes: Secondary | ICD-10-CM

## 2020-06-28 ENCOUNTER — Encounter: Payer: Self-pay | Admitting: Internal Medicine

## 2020-06-28 ENCOUNTER — Inpatient Hospital Stay: Payer: Medicare PPO | Attending: Internal Medicine

## 2020-06-28 ENCOUNTER — Inpatient Hospital Stay: Payer: Medicare PPO | Admitting: Internal Medicine

## 2020-06-28 DIAGNOSIS — Z87891 Personal history of nicotine dependence: Secondary | ICD-10-CM | POA: Insufficient documentation

## 2020-06-28 DIAGNOSIS — C8338 Diffuse large B-cell lymphoma, lymph nodes of multiple sites: Secondary | ICD-10-CM | POA: Diagnosis present

## 2020-06-28 DIAGNOSIS — E7800 Pure hypercholesterolemia, unspecified: Secondary | ICD-10-CM | POA: Diagnosis not present

## 2020-06-28 DIAGNOSIS — C8333 Diffuse large B-cell lymphoma, intra-abdominal lymph nodes: Secondary | ICD-10-CM | POA: Diagnosis not present

## 2020-06-28 DIAGNOSIS — I10 Essential (primary) hypertension: Secondary | ICD-10-CM | POA: Insufficient documentation

## 2020-06-28 DIAGNOSIS — Z8249 Family history of ischemic heart disease and other diseases of the circulatory system: Secondary | ICD-10-CM | POA: Diagnosis not present

## 2020-06-28 DIAGNOSIS — Z9221 Personal history of antineoplastic chemotherapy: Secondary | ICD-10-CM | POA: Insufficient documentation

## 2020-06-28 DIAGNOSIS — Z90710 Acquired absence of both cervix and uterus: Secondary | ICD-10-CM | POA: Diagnosis not present

## 2020-06-28 DIAGNOSIS — E119 Type 2 diabetes mellitus without complications: Secondary | ICD-10-CM | POA: Diagnosis not present

## 2020-06-28 DIAGNOSIS — Z79899 Other long term (current) drug therapy: Secondary | ICD-10-CM | POA: Insufficient documentation

## 2020-06-28 LAB — CBC WITH DIFFERENTIAL/PLATELET
Abs Immature Granulocytes: 0.01 10*3/uL (ref 0.00–0.07)
Basophils Absolute: 0 10*3/uL (ref 0.0–0.1)
Basophils Relative: 0 %
Eosinophils Absolute: 0.2 10*3/uL (ref 0.0–0.5)
Eosinophils Relative: 5 %
HCT: 33.6 % — ABNORMAL LOW (ref 36.0–46.0)
Hemoglobin: 11.2 g/dL — ABNORMAL LOW (ref 12.0–15.0)
Immature Granulocytes: 0 %
Lymphocytes Relative: 9 %
Lymphs Abs: 0.4 10*3/uL — ABNORMAL LOW (ref 0.7–4.0)
MCH: 31.5 pg (ref 26.0–34.0)
MCHC: 33.3 g/dL (ref 30.0–36.0)
MCV: 94.6 fL (ref 80.0–100.0)
Monocytes Absolute: 0.6 10*3/uL (ref 0.1–1.0)
Monocytes Relative: 14 %
Neutro Abs: 3.2 10*3/uL (ref 1.7–7.7)
Neutrophils Relative %: 72 %
Platelets: 181 10*3/uL (ref 150–400)
RBC: 3.55 MIL/uL — ABNORMAL LOW (ref 3.87–5.11)
RDW: 13.7 % (ref 11.5–15.5)
WBC: 4.5 10*3/uL (ref 4.0–10.5)
nRBC: 0 % (ref 0.0–0.2)

## 2020-06-28 LAB — BASIC METABOLIC PANEL
Anion gap: 10 (ref 5–15)
BUN: 16 mg/dL (ref 8–23)
CO2: 27 mmol/L (ref 22–32)
Calcium: 8.8 mg/dL — ABNORMAL LOW (ref 8.9–10.3)
Chloride: 104 mmol/L (ref 98–111)
Creatinine, Ser: 0.84 mg/dL (ref 0.44–1.00)
GFR, Estimated: >60 mL/min (ref >60)
Glucose, Bld: 132 mg/dL — ABNORMAL HIGH (ref 70–99)
Potassium: 4 mmol/L (ref 3.5–5.1)
Sodium: 141 mmol/L (ref 135–145)

## 2020-06-28 LAB — LACTATE DEHYDROGENASE: LDH: 148 U/L (ref 98–192)

## 2020-06-28 MED ORDER — HEPARIN SOD (PORK) LOCK FLUSH 100 UNIT/ML IV SOLN
INTRAVENOUS | Status: AC
  Filled 2020-06-28: qty 5

## 2020-06-28 MED ORDER — SODIUM CHLORIDE 0.9% FLUSH
10.0000 mL | Freq: Once | INTRAVENOUS | Status: AC
  Administered 2020-06-28: 10 mL via INTRAVENOUS
  Filled 2020-06-28: qty 10

## 2020-06-28 MED ORDER — HEPARIN SOD (PORK) LOCK FLUSH 100 UNIT/ML IV SOLN
500.0000 [IU] | Freq: Once | INTRAVENOUS | Status: AC
  Administered 2020-06-28: 500 [IU] via INTRAVENOUS
  Filled 2020-06-28: qty 5

## 2020-06-28 NOTE — Progress Notes (Signed)
[place] OFFICE PROGRESS NOTE  Patient Care Team: Rusty Aus, [name]  Cancer Staging No matching staging information was found for the patient.   Oncology History Overview Note  DEC 2017- DLBCL "TRIPLE HIT [myc/ bcl-2/bcl-6 gene rearrangement FISH]" ~10 cm mass RP LN; STAGE II [jan 2018- BMBx-NEG]; Jan 8th R-CHOP;   # JAN 29th 2018- DA-R EPOCH x5 ; PET CR; s/p RT [last 03/18/2017]  # FEB-MARCH 2019- RECURRENCE of DLBCL [s/p RP Bx; 4 cm mass inferior to Left Kidney] ; II OPINION [facility]; Dr.Grover.   # April 3rd 2019- R-GDP [carbo]; MAY 2019- PET PR; NOV 2019- PR  #August 2020-recurrence bulky left pelvis/retroperitoneal [9-10 cm]; no biopsy. #August 24th-Rituxan weekly cycle #1; prednisone 60 mg a day.   06/03/2019 - Chemotherapy  BMT OP CAR-T LYMPHODEPLETION FLU/CY + IP AXICABTAGENE CILOLEUCEL (YESCARTA) Fludarabine 30 mg/m2 IV Days 1, 2, 3 Cyclophosphamide 500 mg/m2 IV Days 1, 2, 3 Axicabtagene ciloleucel target dose 2 x 10^6 CAR-positive viable T cells per kg body weight (maximum of 2 x 10^8 CAR-positive viable T cells) on Day 6.  # SEP 2020- 37month PET-[facility]- significant PR.   #August 2020-2D echo ejection fraction 60 to 65%  --------------------------------  # JAN 26th-LP  # Interstitial Lung disease [surveillance] --------------------------------------------------------------------------  DIAGNOSIS: 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    05/07/2019 - 05/07/2019 Chemotherapy   The patient had riTUXimab-hyaluronidase human (RITUXAN HYCELA) 1400-23400  MG -UT/11.7ML injection SQ 1,400 mg, 1,400 mg, Subcutaneous,  Once, 1 of 4 cycles Administration: 1,400 mg (05/07/2019)  for chemotherapy treatment.        INTERVAL HISTORY:  [place] 74 y.o.  female pleasant patient above history of recurrent diffuse large B-cell lymphoma is here for follow-up patient currently status post car T-cell therapy in mid October 2020.  In the interim patient had a PET scan/follow-up at [facility]  Sep, 15th, 2021.  Patient denies any abdominal pain or swelling.  Denies any nausea vomiting.  Denies any headaches.  Chronic mild fatigue.  Chronic intermittent nausea.  Otherwise no weight loss.   Review of Systems  Constitutional: Positive for malaise/fatigue. Negative for chills, diaphoresis, fever and weight loss.  HENT: Negative for nosebleeds and sore throat.   Eyes: Negative for double vision.  Respiratory: Positive for shortness of breath. Negative for cough, hemoptysis, sputum production and wheezing.   Cardiovascular: Negative for chest pain, palpitations, orthopnea and leg swelling.  Gastrointestinal: Negative for abdominal pain, blood in stool, constipation, diarrhea, melena and vomiting.  Genitourinary: Negative for dysuria, frequency and urgency.  Musculoskeletal: Positive for joint pain.  Skin: Negative.  Negative for itching and rash.  Neurological: Positive for tingling. Negative for focal weakness and weakness.  Endo/Heme/Allergies: Does not bruise/bleed easily.  Psychiatric/Behavioral: Negative for depression. The patient is not nervous/anxious and does not have insomnia.     PAST MEDICAL HISTORY :  Past Medical History:  Diagnosis Date  . Diabetes mellitus without complication ([place])   . Diffuse large B-cell lymphoma (H[facility])    Chemo + rad tx's  . Heart murmur   . History of chemotherapy 07/08/2017  .  History of gastric ulcer   . History of radiation therapy 07/08/2017  . Hypercholesterolemia   . Hypertension   . ILD (interstitial lung  disease) ([place])    8 yrs ago  . Lymphadenopathy     PAST SURGICAL HISTORY :   Past Surgical History:  Procedure Laterality Date  . BREAST BIOPSY Left 2010   neg  . CATARACT EXTRACTION W/PHACO Left 10/16/2017   Procedure: CATARACT EXTRACTION PHACO AND INTRAOCULAR LENS PLACEMENT ([place]) COMPLICATED DIABETIC LEFT;  Surgeon: Leandrew Koyanagi, MD;  Location: [place];  Service: Ophthalmology;  Laterality: Left;  IRIS HOOKS Diabetic - oral meds  . HERNIA REPAIR    . PARTIAL HYSTERECTOMY     age 74  . PERIPHERAL VASCULAR CATHETERIZATION N/A 08/22/2016   Procedure: [name];  Surgeon: [name], MD;  Location: [place] [facility];  Service: Cardiovascular;  Laterality: N/A;  . TONSILLECTOMY      FAMILY HISTORY :   Family History  Problem Relation Age of Onset  . Heart disease Mother   . Heart disease Father   . Heart disease Brother     SOCIAL HISTORY:   Social History   Tobacco Use  . Smoking status: Former Smoker    Quit date: 08/21/1958    Years since quitting: 61.9  . Smokeless tobacco: Never Used  Vaping Use  . Vaping Use: Never used  Substance Use Topics  . Alcohol use: No  . Drug use: No    ALLERGIES:  is allergic to levaquin [levofloxacin in d5w], cefuroxime axetil, and doxycycline.  MEDICATIONS:  Current Outpatient Medications  Medication Sig Dispense Refill  . b complex vitamins tablet Take 1 tablet by mouth daily.    Marland Kitchen gabapentin (NEURONTIN) 300 MG capsule Take 1 capsule (300 mg total) by mouth 2 (two) times daily. (Patient taking differently: Take 300 mg by mouth 3 (three) times daily. 1 in am and 2 at night) 60 capsule 3  . latanoprost (XALATAN) 0.005 % ophthalmic solution Place 1 drop into both eyes at bedtime.     . lidocaine-prilocaine (EMLA) cream Apply cream 1 hour before chemotherapy treatment, place small amount of saran wrap over cream to protect clothing. 30 g 1  . Magnesium 400 MG TABS Take 1 tablet by mouth daily.    .  magnesium oxide (MAG-OX) 400 MG tablet Take 1 tablet by mouth daily.    . metoprolol tartrate (LOPRESSOR) 25 MG tablet TAKE 1 TABLET BY MOUTH TWICE DAILY (Patient taking differently: 12.5 mg 2 (two) times daily. (BETA BLOCKER)) 180 tablet 0  . pantoprazole (PROTONIX) 40 MG tablet Take 1 tablet by mouth in the morning and at bedtime.    . pravastatin (PRAVACHOL) 40 MG tablet Take 40 mg by mouth daily.    Marland Kitchen sulfamethoxazole-trimethoprim (BACTRIM DS) 800-160 MG tablet Take 1 tablet by mouth 3 (three) times a week.    . valACYclovir (VALTREX) 500 MG tablet Take 1 tablet by mouth every morning.    . montelukast (SINGULAIR) 10 MG tablet One a day. (Patient not taking: Reported on 06/28/2020) 30 tablet 11   No current facility-administered medications for this visit.   Facility-Administered Medications Ordered in Other Visits  Medication Dose Route Frequency Provider Last Rate Last Admin  . 0.9 %  sodium chloride infusion   Intravenous Continuous Cammie Sickle, MD 10 mL/hr at 12/05/16 1030 New Bag at 12/05/16 1030  . 0.9 %  sodium chloride infusion   Intravenous Continuous Cammie Sickle, MD  Stopped at 12/26/16 1011  . 0.9 %  sodium chloride infusion   Intravenous Continuous Charlaine Dalton R, MD 10 mL/hr at 12/27/16 0930 New Bag at 12/27/16 0930  . heparin lock flush 100 unit/mL  500 Units Intravenous Once Charlaine Dalton R, MD      . heparin lock flush 100 unit/mL  500 Units Intravenous Once Charlaine Dalton R, MD      . sodium chloride flush (NS) 0.9 % injection 10 mL  10 mL Intravenous PRN Cammie Sickle, MD   10 mL at 11/06/16 1000  . sodium chloride flush (NS) 0.9 % injection 10 mL  10 mL Intravenous PRN Cammie Sickle, MD   10 mL at 11/07/16 0905  . sodium chloride flush (NS) 0.9 % injection 10 mL  10 mL Intravenous PRN Charlaine Dalton R, MD      . sodium chloride flush (NS) 0.9 % injection 10 mL  10 mL Intravenous PRN Cammie Sickle, MD   10 mL  at 12/05/16 1030  . sodium chloride flush (NS) 0.9 % injection 10 mL  10 mL Intravenous PRN Cammie Sickle, MD   10 mL at 12/27/16 0930    PHYSICAL EXAMINATION: ECOG PERFORMANCE STATUS: 1 - Symptomatic but completely ambulatory  BP 116/63 (BP Location: Left Arm, Patient Position: Sitting, Cuff Size: Normal)   Pulse 73   Temp 98.7 F (37.1 C) (Tympanic)   Resp 16   Ht 5\' 7"  (1.702 m)   Wt 153 lb 12.8 oz (69.8 kg)   SpO2 100%   BMI 24.09 kg/m   Filed Weights   06/28/20 1304  Weight: 153 lb 12.8 oz (69.8 kg)    Physical Exam Constitutional:      Comments: She is alone.  Walking independently.  HENT:     Head: Normocephalic and atraumatic.     Mouth/Throat:     Pharynx: No oropharyngeal exudate.  Eyes:     Pupils: Pupils are equal, round, and reactive to light.  Cardiovascular:     Rate and Rhythm: Normal rate and regular rhythm.  Pulmonary:     Effort: No respiratory distress.     Breath sounds: No wheezing.     Comments: Bilateral crackles at the bases.  No wheeze. Abdominal:     General: Bowel sounds are normal. There is no distension.     Palpations: Abdomen is soft. There is no mass.     Tenderness: There is no abdominal tenderness. There is no guarding or rebound.  Musculoskeletal:        General: No tenderness. Normal range of motion.     Cervical back: Normal range of motion and neck supple.  Skin:    General: Skin is warm.  Neurological:     Mental Status: She is alert and oriented to person, place, and time.  Psychiatric:        Mood and Affect: Affect normal.    LABORATORY DATA:  I have reviewed the data as listed    Component Value Date/Time   NA 141 06/28/2020 1240   K 4.0 06/28/2020 1240   K 4.1 07/01/2014 1429   CL 104 06/28/2020 1240   CO2 27 06/28/2020 1240   GLUCOSE 132 (H) 06/28/2020 1240   BUN 16 06/28/2020 1240   CREATININE 0.84 06/28/2020 1240   CALCIUM 8.8 (L) 06/28/2020 1240   PROT 6.2 (L) 07/29/2019 0953   ALBUMIN 3.9  07/29/2019 0953   AST 15 07/29/2019 0953   ALT 12 07/29/2019  0953   ALKPHOS 46 07/29/2019 0953   BILITOT 0.4 07/29/2019 0953   GFRNONAA >60 06/28/2020 1240   GFRAA >60 03/28/2020 1305    No results found for: SPEP, UPEP  Lab Results  Component Value Date   WBC 4.5 06/28/2020   NEUTROABS 3.2 06/28/2020   HGB 11.2 (L) 06/28/2020   HCT 33.6 (L) 06/28/2020   MCV 94.6 06/28/2020   PLT 181 06/28/2020      Chemistry      Component Value Date/Time   NA 141 06/28/2020 1240   K 4.0 06/28/2020 1240   K 4.1 07/01/2014 1429   CL 104 06/28/2020 1240   CO2 27 06/28/2020 1240   BUN 16 06/28/2020 1240   CREATININE 0.84 06/28/2020 1240      Component Value Date/Time   CALCIUM 8.8 (L) 06/28/2020 1240   ALKPHOS 46 07/29/2019 0953   AST 15 07/29/2019 0953   ALT 12 07/29/2019 0953   BILITOT 0.4 07/29/2019 0953       RADIOGRAPHIC STUDIES: I have personally reviewed the radiological images as listed and agreed with the findings in the report. No results found.   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  Reviewed records from [facility]/summarized above.  # No clinical evidence of progression at this time.  STABLE; await repeat imaging in June 2021.   # Right ankle fracture [s/p Ortho; Dr.Klein]- conservative  # Hypogammaglobiemia-asymptomatic. STABLE;  [place] for now.   # Prophylactic antibiotics with Valtrex and Bactrim.  # Peripheral neuropathy/left foot drop- STABLE.     # ILD- STABLE  # DISPOSITION:  # follow up in 3 month 2021-MD; labs- cbc/bmp/LDH; port flush- Dr.B   Orders Placed This Encounter  Procedures  . CBC with Differential/Platelet    Standing Status:   Future    Standing Expiration Date:   06/28/2021   . Basic metabolic panel    Standing Status:   Future    Standing Expiration Date:   06/28/2021  . Lactate dehydrogenase    Standing Status:   Future    Standing Expiration Date:   06/28/2021   All questions were answered. The patient knows to call the clinic with any problems, questions or concerns.      Cammie Sickle, MD 07/01/2020 7:41 AM

## 2020-06-28 NOTE — Assessment & Plan Note (Addendum)
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  Reviewed records from [facility]/summarized above.  # No clinical evidence of progression at this time.  STABLE; await repeat imaging in June 2021.   # Right ankle fracture [s/p Ortho; Dr.Klein]- conservative  # Hypogammaglobiemia-asymptomatic. STABLE;  [place] for now.   # Prophylactic antibiotics with Valtrex and Bactrim.  # Peripheral neuropathy/left foot drop- STABLE.     # ILD- STABLE  # DISPOSITION:  # follow up in 3 month 2021-MD; labs- cbc/bmp/LDH; port flush- Dr.B

## 2020-09-27 ENCOUNTER — Inpatient Hospital Stay: Payer: Medicare PPO | Admitting: Internal Medicine

## 2020-09-27 ENCOUNTER — Other Ambulatory Visit: Payer: Self-pay

## 2020-09-27 ENCOUNTER — Other Ambulatory Visit: Payer: Self-pay | Admitting: *Deleted

## 2020-09-27 ENCOUNTER — Encounter: Payer: Self-pay | Admitting: Internal Medicine

## 2020-09-27 ENCOUNTER — Inpatient Hospital Stay: Payer: Medicare PPO | Attending: Internal Medicine

## 2020-09-27 VITALS — BP 123/68 | HR 72 | Temp 97.7°F | Resp 18 | Ht 67.0 in | Wt 154.0 lb

## 2020-09-27 DIAGNOSIS — C8333 Diffuse large B-cell lymphoma, intra-abdominal lymph nodes: Secondary | ICD-10-CM

## 2020-09-27 DIAGNOSIS — Z90710 Acquired absence of both cervix and uterus: Secondary | ICD-10-CM | POA: Diagnosis not present

## 2020-09-27 DIAGNOSIS — E7800 Pure hypercholesterolemia, unspecified: Secondary | ICD-10-CM | POA: Insufficient documentation

## 2020-09-27 DIAGNOSIS — I10 Essential (primary) hypertension: Secondary | ICD-10-CM | POA: Diagnosis not present

## 2020-09-27 DIAGNOSIS — Z95828 Presence of other vascular implants and grafts: Secondary | ICD-10-CM

## 2020-09-27 DIAGNOSIS — Z8572 Personal history of non-Hodgkin lymphomas: Secondary | ICD-10-CM | POA: Diagnosis not present

## 2020-09-27 DIAGNOSIS — Z87891 Personal history of nicotine dependence: Secondary | ICD-10-CM | POA: Insufficient documentation

## 2020-09-27 DIAGNOSIS — E119 Type 2 diabetes mellitus without complications: Secondary | ICD-10-CM | POA: Diagnosis not present

## 2020-09-27 DIAGNOSIS — Z8249 Family history of ischemic heart disease and other diseases of the circulatory system: Secondary | ICD-10-CM | POA: Diagnosis not present

## 2020-09-27 DIAGNOSIS — Z79899 Other long term (current) drug therapy: Secondary | ICD-10-CM | POA: Insufficient documentation

## 2020-09-27 LAB — CBC WITH DIFFERENTIAL/PLATELET
Abs Immature Granulocytes: 0.1 10*3/uL — ABNORMAL HIGH (ref 0.00–0.07)
Basophils Absolute: 0 10*3/uL (ref 0.0–0.1)
Basophils Relative: 1 %
Eosinophils Absolute: 0.2 10*3/uL (ref 0.0–0.5)
Eosinophils Relative: 6 %
HCT: 36.8 % (ref 36.0–46.0)
Hemoglobin: 12.2 g/dL (ref 12.0–15.0)
Immature Granulocytes: 3 %
Lymphocytes Relative: 9 %
Lymphs Abs: 0.4 10*3/uL — ABNORMAL LOW (ref 0.7–4.0)
MCH: 30.8 pg (ref 26.0–34.0)
MCHC: 33.2 g/dL (ref 30.0–36.0)
MCV: 92.9 fL (ref 80.0–100.0)
Monocytes Absolute: 0.7 10*3/uL (ref 0.1–1.0)
Monocytes Relative: 19 %
Neutro Abs: 2.4 10*3/uL (ref 1.7–7.7)
Neutrophils Relative %: 62 %
Platelets: 182 10*3/uL (ref 150–400)
RBC: 3.96 MIL/uL (ref 3.87–5.11)
RDW: 13.2 % (ref 11.5–15.5)
WBC: 3.9 10*3/uL — ABNORMAL LOW (ref 4.0–10.5)
nRBC: 0 % (ref 0.0–0.2)

## 2020-09-27 LAB — BASIC METABOLIC PANEL
Anion gap: 9 (ref 5–15)
BUN: 16 mg/dL (ref 8–23)
CO2: 27 mmol/L (ref 22–32)
Calcium: 9 mg/dL (ref 8.9–10.3)
Chloride: 104 mmol/L (ref 98–111)
Creatinine, Ser: 0.59 mg/dL (ref 0.44–1.00)
GFR, Estimated: >60 mL/min (ref >60)
Glucose, Bld: 119 mg/dL — ABNORMAL HIGH (ref 70–99)
Potassium: 3.8 mmol/L (ref 3.5–5.1)
Sodium: 140 mmol/L (ref 135–145)

## 2020-09-27 LAB — LACTATE DEHYDROGENASE: LDH: 193 U/L — ABNORMAL HIGH (ref 98–192)

## 2020-09-27 MED ORDER — MONTELUKAST SODIUM 10 MG PO TABS: 10.0000 mg | ORAL_TABLET | Freq: Every day | ORAL | 11 refills | Status: DC

## 2020-09-27 MED ORDER — HEPARIN SOD (PORK) LOCK FLUSH 100 UNIT/ML IV SOLN
500.0000 [IU] | Freq: Once | INTRAVENOUS | Status: AC
  Administered 2020-09-27: 500 [IU] via INTRAVENOUS
  Filled 2020-09-27: qty 5

## 2020-09-27 MED ORDER — SODIUM CHLORIDE 0.9% FLUSH
10.0000 mL | INTRAVENOUS | Status: AC | PRN
  Administered 2020-09-27: 10 mL via INTRAVENOUS
  Filled 2020-09-27: qty 10

## 2020-09-27 MED ORDER — HEPARIN SOD (PORK) LOCK FLUSH 100 UNIT/ML IV SOLN
INTRAVENOUS | Status: AC
  Filled 2020-09-27: qty 5

## 2020-09-27 NOTE — Progress Notes (Signed)
[facility] OFFICE PROGRESS NOTE  Patient Care Team: Rusty Aus, [name]  Cancer Staging No matching staging information was found for the patient.   Oncology History Overview Note  DEC 2017- DLBCL "TRIPLE HIT [myc/ bcl-2/bcl-6 gene rearrangement FISH]" ~10 cm mass RP LN; STAGE II [jan 2018- BMBx-NEG]; Jan 8th R-[facility];   # JAN 29th 2018- DA-R EPOCH x5 ; PET CR; s/p RT [last 03/18/2017]  # FEB-MARCH 2019- RECURRENCE of DLBCL [s/p RP Bx; 4 cm mass inferior to Left Kidney] ; II [facility]; Dr.Grover.   # April 3rd 2019- R-GDP [carbo]; MAY 2019- PET PR; NOV 2019- PR  #August 2020-recurrence bulky left pelvis/retroperitoneal [9-10 cm]; no biopsy. #August 24th-Rituxan weekly cycle #1; prednisone 60 mg a day.   06/03/2019 - Chemotherapy  BMT OP CAR-T LYMPHODEPLETION FLU/CY + IP AXICABTAGENE CILOLEUCEL (YESCARTA) Fludarabine 30 mg/m2 IV Days 1, 2, 3 Cyclophosphamide 500 mg/m2 IV Days 1, 2, 3 Axicabtagene ciloleucel target dose 2 x 10^6 CAR-positive viable T cells per kg body weight (maximum of 2 x 10^8 CAR-positive viable T cells) on Day 6.  # SEP 2020- 10month PET-[facility]- significant PR.   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    05/07/2019 - 05/07/2019 Chemotherapy   The patient had riTUXimab-hyaluronidase human (RITUXAN HYCELA) 1400-23400  MG -UT/11.7ML injection SQ 1,400 mg, 1,400 mg, Subcutaneous,  Once, 1 of 4 cycles Administration: 1,400 mg (05/07/2019)  for chemotherapy treatment.        INTERVAL HISTORY:  [place] 75 y.o.  female pleasant patient above history of recurrent diffuse large B-cell lymphoma is here for follow-up patient currently status post car T-cell therapy in mid October 2020.  Patient denies abdominal pain.  Denies any abdominal swelling.  No nausea no vomiting.  Continues cutup allergies.  Denies any headaches.  Chronic mild fatigue.  No night sweats.   Review of Systems  Constitutional: Positive for malaise/fatigue. Negative for chills, diaphoresis, fever and weight loss.  HENT: Negative for nosebleeds and sore throat.   Eyes: Negative for double vision.  Respiratory: Positive for shortness of breath. Negative for cough, hemoptysis, sputum production and wheezing.   Cardiovascular: Negative for chest pain, palpitations, orthopnea and leg swelling.  Gastrointestinal: Negative for abdominal pain, blood in stool, constipation, diarrhea, melena and vomiting.  Genitourinary: Negative for dysuria, frequency and urgency.  Musculoskeletal: Positive for joint pain.  Skin: Negative.  Negative for itching and rash.  Neurological: Positive for tingling. Negative for focal weakness and weakness.  Endo/Heme/Allergies: Does not bruise/bleed easily.  Psychiatric/Behavioral: Negative for depression. The patient is not nervous/anxious and does not have insomnia.     PAST MEDICAL HISTORY :  Past Medical History:  Diagnosis Date  . Diabetes mellitus without complication ([place])   . Diffuse large B-cell lymphoma ([facility])    Chemo + rad tx's  . Heart murmur   . History of chemotherapy 07/08/2017  . History of gastric ulcer   . History of radiation therapy 07/08/2017  .  Hypercholesterolemia   . Hypertension   . ILD (interstitial lung disease) ([place])    8 yrs ago  . Lymphadenopathy     PAST SURGICAL HISTORY :    Past Surgical History:  Procedure Laterality Date  . BREAST BIOPSY Left 2010   neg  . CATARACT EXTRACTION W/PHACO Left 10/16/2017   Procedure: CATARACT EXTRACTION PHACO AND INTRAOCULAR LENS PLACEMENT ([place]) COMPLICATED DIABETIC LEFT;  Surgeon: Leandrew Koyanagi, MD;  Location: [facility];  Service: Ophthalmology;  Laterality: Left;  IRIS HOOKS Diabetic - oral meds  . HERNIA REPAIR    . PARTIAL HYSTERECTOMY     age 13  . PERIPHERAL VASCULAR CATHETERIZATION N/A 08/22/2016   Procedure: [name];  Surgeon: [name], MD;  Location: [facility];  Service: Cardiovascular;  Laterality: N/A;  . TONSILLECTOMY      FAMILY HISTORY :   Family History  Problem Relation Age of Onset  . Heart disease Mother   . Heart disease Father   . Heart disease Brother     SOCIAL HISTORY:   Social History   Tobacco Use  . Smoking status: Former Smoker    Quit date: 08/21/1958    Years since quitting: 62.1  . Smokeless tobacco: Never Used  Vaping Use  . Vaping Use: Never used  Substance Use Topics  . Alcohol use: No  . Drug use: No    ALLERGIES:  is allergic to levaquin [levofloxacin in d5w], cefuroxime axetil, and doxycycline.  MEDICATIONS:  Current Outpatient Medications  Medication Sig Dispense Refill  . b complex vitamins tablet Take 1 tablet by mouth daily.    Marland Kitchen gabapentin (NEURONTIN) 300 MG capsule Take 1 capsule (300 mg total) by mouth 2 (two) times daily. (Patient taking differently: Take 300 mg by mouth 3 (three) times daily. 1 in am and 2 at night) 60 capsule 3  . latanoprost (XALATAN) 0.005 % ophthalmic solution Place 1 drop into both eyes at bedtime.     . Magnesium 400 MG TABS Take 1 tablet by mouth daily.    . magnesium oxide (MAG-OX) 400 MG tablet Take 1 tablet by mouth daily.    . metoprolol tartrate (LOPRESSOR) 25 MG tablet TAKE 1 TABLET BY MOUTH TWICE DAILY (Patient taking differently: 12.5 mg 2 (two) times daily. (BETA BLOCKER)) 180 tablet  0  . pantoprazole (PROTONIX) 40 MG tablet Take 1 tablet by mouth in the morning and at bedtime.    . pravastatin (PRAVACHOL) 40 MG tablet Take 40 mg by mouth daily.    Marland Kitchen sulfamethoxazole-trimethoprim (BACTRIM DS) 800-160 MG tablet Take 1 tablet by mouth 3 (three) times a week.    . valACYclovir (VALTREX) 500 MG tablet Take 1 tablet by mouth every morning.    . montelukast (SINGULAIR) 10 MG tablet Take 1 tablet (10 mg total) by mouth at bedtime. One a day. 30 tablet 11   No current facility-administered medications for this visit.   Facility-Administered Medications Ordered in Other Visits  Medication Dose Route Frequency Provider Last Rate Last Admin  . 0.9 %  sodium chloride infusion   Intravenous Continuous Cammie Sickle, MD 10 mL/hr at 12/05/16 1030 New Bag at 12/05/16 1030  . 0.9 %  sodium chloride infusion   Intravenous Continuous Cammie Sickle, MD   Stopped at 12/26/16 1011  . 0.9 %  sodium chloride infusion   Intravenous Continuous Cammie Sickle, MD 10 mL/hr at 12/27/16 0930 New Bag at 12/27/16 0930  . heparin lock  flush 100 unit/mL  500 Units Intravenous Once Charlaine Dalton R, MD      . heparin lock flush 100 unit/mL  500 Units Intravenous Once Charlaine Dalton R, MD      . sodium chloride flush (NS) 0.9 % injection 10 mL  10 mL Intravenous PRN Cammie Sickle, MD   10 mL at 11/06/16 1000  . sodium chloride flush (NS) 0.9 % injection 10 mL  10 mL Intravenous PRN Cammie Sickle, MD   10 mL at 11/07/16 0905  . sodium chloride flush (NS) 0.9 % injection 10 mL  10 mL Intravenous PRN Charlaine Dalton R, MD      . sodium chloride flush (NS) 0.9 % injection 10 mL  10 mL Intravenous PRN Cammie Sickle, MD   10 mL at 12/05/16 1030  . sodium chloride flush (NS) 0.9 % injection 10 mL  10 mL Intravenous PRN Cammie Sickle, MD   10 mL at 12/27/16 0930  . sodium chloride flush (NS) 0.9 % injection 10 mL  10 mL Intravenous PRN Cammie Sickle, MD   10 mL at 09/27/20 1305    PHYSICAL EXAMINATION: ECOG PERFORMANCE STATUS: 1 - Symptomatic but completely ambulatory  BP 123/68   Pulse 72   Temp 97.7 F (36.5 C) (Tympanic)   Resp 18   Ht 5\' 7"  (1.702 m)   Wt 154 lb (69.9 kg)   BMI 24.12 kg/m   Filed Weights   09/27/20 1315  Weight: 154 lb (69.9 kg)    Physical Exam Constitutional:      Comments: She is alone.  Walking independently.  HENT:     Head: Normocephalic and atraumatic.     Mouth/Throat:     Pharynx: No oropharyngeal exudate.  Eyes:     Pupils: Pupils are equal, round, and reactive to light.  Cardiovascular:     Rate and Rhythm: Normal rate and regular rhythm.  Pulmonary:     Effort: No respiratory distress.     Breath sounds: No wheezing.     Comments: Bilateral crackles at the bases.  No wheeze. Abdominal:     General: Bowel sounds are normal. There is no distension.     Palpations: Abdomen is soft. There is no mass.     Tenderness: There is no abdominal tenderness. There is no guarding or rebound.  Musculoskeletal:        General: No tenderness. Normal range of motion.     Cervical back: Normal range of motion and neck supple.  Skin:    General: Skin is warm.  Neurological:     Mental Status: She is alert and oriented to person, place, and time.  Psychiatric:        Mood and Affect: Affect normal.    LABORATORY DATA:  I have reviewed the data as listed    Component Value Date/Time   NA 140 09/27/2020 1253   K 3.8 09/27/2020 1253   K 4.1 07/01/2014 1429   CL 104 09/27/2020 1253   CO2 27 09/27/2020 1253   GLUCOSE 119 (H) 09/27/2020 1253   BUN 16 09/27/2020 1253   CREATININE 0.59 09/27/2020 1253   CALCIUM 9.0 09/27/2020 1253   PROT 6.2 (L) 07/29/2019 0953   ALBUMIN 3.9 07/29/2019 0953   AST 15 07/29/2019 0953   ALT 12 07/29/2019 0953   ALKPHOS 46 07/29/2019 0953   BILITOT 0.4 07/29/2019 0953   GFRNONAA >60 09/27/2020 1253   GFRAA >60 03/28/2020 1305  No results found  for: SPEP, UPEP  Lab Results  Component Value Date   WBC 3.9 (L) 09/27/2020   NEUTROABS 2.4 09/27/2020   HGB 12.2 09/27/2020   HCT 36.8 09/27/2020   MCV 92.9 09/27/2020   PLT 182 09/27/2020      Chemistry      Component Value Date/Time   NA 140 09/27/2020 1253   K 3.8 09/27/2020 1253   K 4.1 07/01/2014 1429   CL 104 09/27/2020 1253   CO2 27 09/27/2020 1253   BUN 16 09/27/2020 1253   CREATININE 0.59 09/27/2020 1253      Component Value Date/Time   CALCIUM 9.0 09/27/2020 1253   ALKPHOS 46 07/29/2019 0953   AST 15 07/29/2019 0953   ALT 12 07/29/2019 0953   BILITOT 0.4 07/29/2019 0953       RADIOGRAPHIC STUDIES: I have personally reviewed the radiological images as listed and agreed with the findings in the report. No results found.   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   # No clinical evidence of progression at this time.  STABLE; await repeat imaging in mid march 2022; at [facility].  LDH elevated at 193 [normal 192]-suspect this is from hemolysis/difficult blood draw from the port-see below.  Clinically doubt if this is recurrent disease.  However await PET scan in March 2022.  # Hypogammaglobiemia-asymptomatic.  Stable [place] for now.   # Prophylactic antibiotics with Valtrex and Bactrim.  # Peripheral neuropathy/left foot drop-STABLE>   # ILD- STABLE.   # Mediport-IV access- flushing but not drawing blood.  Recommend a port study.  And also clot Buster fibrin sheath noted.  # DISPOSITION:  # dye study # follow up in 3 month 2021-MD; labs- cbc/bmp/LDH; port flush- Dr.B   Orders Placed This Encounter  Procedures  . IR CV Line Injection    Standing Status:   Future    Standing Expiration Date:   09/27/2021    Order  Specific Question:   Reason for Exam (SYMPTOM  OR DIAGNOSIS REQUIRED)    Answer:   port malfunction    Order Specific Question:   Preferred Imaging Location?    Answer:   [facility]   All questions were answered. The patient knows to call the clinic with any problems, questions or concerns.      Cammie Sickle, MD 09/27/2020 1:50 PM

## 2020-09-27 NOTE — Assessment & Plan Note (Addendum)
#  Relapsed refractory DLBCL-  Triple hit diffuse large B-cell lymphoma; in OCT 2020- S/p CAR-T cell therapy; SEP 2021 STABLE FDG left iliopsoas/similar soft tissue around left quadratus lumborum- STABLE.   # No clinical evidence of progression at this time.  STABLE; await repeat imaging in mid march 2022; at [facility].  LDH elevated at 193 [normal 192]-suspect this is from hemolysis/difficult blood draw from the port-see below.  Clinically doubt if this is recurrent disease.  However await PET scan in March 2022.  # Hypogammaglobiemia-asymptomatic.  Stable [place] for now.   # Prophylactic antibiotics with Valtrex and Bactrim.  # Peripheral neuropathy/left foot drop-STABLE>   # ILD- STABLE.   # Mediport-IV access- flushing but not drawing blood.  Recommend a port study.  And also clot Buster fibrin sheath noted.  # DISPOSITION:  # dye study # follow up in 3 month 2021-MD; labs- cbc/bmp/LDH; port flush- Dr.B

## 2020-10-06 ENCOUNTER — Other Ambulatory Visit: Payer: Self-pay | Admitting: Internal Medicine

## 2020-10-06 ENCOUNTER — Other Ambulatory Visit: Payer: Self-pay

## 2020-10-06 ENCOUNTER — Ambulatory Visit
Admission: RE | Admit: 2020-10-06 | Discharge: 2020-10-06 | Disposition: A | Discharge status: Home / Self Care | Payer: Medicare PPO | Source: Ambulatory Visit | Attending: Internal Medicine | Admitting: Internal Medicine

## 2020-10-06 DIAGNOSIS — Z95828 Presence of other vascular implants and grafts: Secondary | ICD-10-CM | POA: Diagnosis present

## 2020-10-06 DIAGNOSIS — C8333 Diffuse large B-cell lymphoma, intra-abdominal lymph nodes: Secondary | ICD-10-CM | POA: Diagnosis not present

## 2020-12-27 ENCOUNTER — Inpatient Hospital Stay: Payer: Medicare PPO | Admitting: Internal Medicine

## 2020-12-27 ENCOUNTER — Encounter: Payer: Self-pay | Admitting: Internal Medicine

## 2020-12-27 ENCOUNTER — Inpatient Hospital Stay: Payer: Medicare PPO | Attending: Internal Medicine

## 2020-12-27 DIAGNOSIS — Z9221 Personal history of antineoplastic chemotherapy: Secondary | ICD-10-CM | POA: Diagnosis not present

## 2020-12-27 DIAGNOSIS — Z79899 Other long term (current) drug therapy: Secondary | ICD-10-CM | POA: Insufficient documentation

## 2020-12-27 DIAGNOSIS — C8333 Diffuse large B-cell lymphoma, intra-abdominal lymph nodes: Secondary | ICD-10-CM

## 2020-12-27 DIAGNOSIS — I10 Essential (primary) hypertension: Secondary | ICD-10-CM | POA: Insufficient documentation

## 2020-12-27 DIAGNOSIS — Z923 Personal history of irradiation: Secondary | ICD-10-CM | POA: Insufficient documentation

## 2020-12-27 DIAGNOSIS — Z8572 Personal history of non-Hodgkin lymphomas: Secondary | ICD-10-CM | POA: Diagnosis not present

## 2020-12-27 DIAGNOSIS — Z87891 Personal history of nicotine dependence: Secondary | ICD-10-CM | POA: Insufficient documentation

## 2020-12-27 LAB — COMPREHENSIVE METABOLIC PANEL
ALT: 11 U/L (ref 0–44)
AST: 18 U/L (ref 15–41)
Albumin: 4.1 g/dL (ref 3.5–5.0)
Alkaline Phosphatase: 54 U/L (ref 38–126)
Anion gap: 11 (ref 5–15)
BUN: 11 mg/dL (ref 8–23)
CO2: 25 mmol/L (ref 22–32)
Calcium: 9 mg/dL (ref 8.9–10.3)
Chloride: 103 mmol/L (ref 98–111)
Creatinine, Ser: 0.62 mg/dL (ref 0.44–1.00)
GFR, Estimated: >60 mL/min (ref >60)
Glucose, Bld: 96 mg/dL (ref 70–99)
Potassium: 3.6 mmol/L (ref 3.5–5.1)
Sodium: 139 mmol/L (ref 135–145)
Total Bilirubin: 0.4 mg/dL (ref 0.3–1.2)
Total Protein: 6.5 g/dL (ref 6.5–8.1)

## 2020-12-27 LAB — LACTATE DEHYDROGENASE: LDH: 216 U/L — ABNORMAL HIGH (ref 98–192)

## 2020-12-27 LAB — CBC WITH DIFFERENTIAL/PLATELET
Abs Immature Granulocytes: 0.23 10*3/uL — ABNORMAL HIGH (ref 0.00–0.07)
Basophils Absolute: 0 10*3/uL (ref 0.0–0.1)
Basophils Relative: 1 %
Eosinophils Absolute: 0.3 10*3/uL (ref 0.0–0.5)
Eosinophils Relative: 9 %
HCT: 35.3 % — ABNORMAL LOW (ref 36.0–46.0)
Hemoglobin: 11.7 g/dL — ABNORMAL LOW (ref 12.0–15.0)
Immature Granulocytes: 6 %
Lymphocytes Relative: 14 %
Lymphs Abs: 0.6 10*3/uL — ABNORMAL LOW (ref 0.7–4.0)
MCH: 30.9 pg (ref 26.0–34.0)
MCHC: 33.1 g/dL (ref 30.0–36.0)
MCV: 93.1 fL (ref 80.0–100.0)
Monocytes Absolute: 0.7 10*3/uL (ref 0.1–1.0)
Monocytes Relative: 17 %
Neutro Abs: 2.2 10*3/uL (ref 1.7–7.7)
Neutrophils Relative %: 53 %
Platelets: 177 10*3/uL (ref 150–400)
RBC: 3.79 MIL/uL — ABNORMAL LOW (ref 3.87–5.11)
RDW: 14.4 % (ref 11.5–15.5)
Smear Review: NORMAL
WBC: 4 10*3/uL (ref 4.0–10.5)
nRBC: 0 % (ref 0.0–0.2)

## 2020-12-27 MED ORDER — HEPARIN SOD (PORK) LOCK FLUSH 100 UNIT/ML IV SOLN
500.0000 [IU] | Freq: Once | INTRAVENOUS | Status: AC
  Administered 2020-12-27: 500 [IU] via INTRAVENOUS
  Filled 2020-12-27: qty 5

## 2020-12-27 NOTE — Assessment & Plan Note (Signed)
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 No clinical evidence of progression at this time. MARCH 2022- PET STABLE; awaiting repeat scan in sep 2022.  # Prophylactic antibiotics with Valtrex and Bactrim.  # Peripheral neuropathy/left foot drop-STABLE>   # ILD- STABLE.   # Mediport-IV access-stable; port flush.  # DISPOSITION: # port flush in 3 months  # follow up in 6 month-MD; labs- cbc/bmp/LDH; port flush- Dr.B

## 2020-12-27 NOTE — Progress Notes (Signed)
[facility] OFFICE PROGRESS NOTE  Patient Care Team: Rusty Aus, [name]  Cancer Staging No matching staging information was found for the patient.   Oncology History Overview Note  DEC 2017- DLBCL "TRIPLE HIT [myc/ bcl-2/bcl-6 gene rearrangement FISH]" ~10 cm mass RP LN; STAGE II [jan 2018- BMBx-NEG]; Jan 8th R-[facility];   # JAN 29th 2018- DA-R EPOCH x5 ; PET CR; s/p RT [last 03/18/2017]  # FEB-MARCH 2019- RECURRENCE of DLBCL [s/p RP Bx; 4 cm mass inferior to Left Kidney] ; II [facility]; Dr.Grover.   # April 3rd 2019- R-GDP [carbo]; MAY 2019- PET PR; NOV 2019- PR  #August 2020-recurrence bulky left pelvis/retroperitoneal [9-10 cm]; no biopsy. #August 24th-Rituxan weekly cycle #1; prednisone 60 mg a day.   06/03/2019 - Chemotherapy  BMT OP CAR-T LYMPHODEPLETION FLU/CY + IP AXICABTAGENE CILOLEUCEL (YESCARTA) Fludarabine 30 mg/m2 IV Days 1, 2, 3 Cyclophosphamide 500 mg/m2 IV Days 1, 2, 3 Axicabtagene ciloleucel target dose 2 x 10^6 CAR-positive viable T cells per kg body weight (maximum of 2 x 10^8 CAR-positive viable T cells) on Day 6.  # SEP 2020- 99month PET-[facility]- significant PR.   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    05/07/2019 - 05/07/2019 Chemotherapy   The patient had riTUXimab-hyaluronidase human (RITUXAN HYCELA) 1400-23400  MG -UT/11.7ML injection SQ 1,400 mg, 1,400 mg, Subcutaneous,  Once, 1 of 4 cycles Administration: 1,400 mg (05/07/2019)  for chemotherapy treatment.        INTERVAL HISTORY:  [place] 75 y.o.  female pleasant patient above history of recurrent diffuse large B-cell lymphoma is here for follow-up patient currently status post car T-cell therapy in mid October 2020.  Bladder infection s/p 4 rounds of anti-biotics [Dr.Miller].  Currently resolved.  No nausea no vomiting.  No abdominal pain.  No headaches.  No night sweats.  Review of Systems  Constitutional: Positive for malaise/fatigue. Negative for chills, diaphoresis, fever and weight loss.  HENT: Negative for nosebleeds and sore throat.   Eyes: Negative for double vision.  Respiratory: Positive for shortness of breath. Negative for cough, hemoptysis, sputum production and wheezing.   Cardiovascular: Negative for chest pain, palpitations, orthopnea and leg swelling.  Gastrointestinal: Negative for abdominal pain, blood in stool, constipation, diarrhea, melena and vomiting.  Genitourinary: Negative for dysuria, frequency and urgency.  Musculoskeletal: Positive for joint pain.  Skin: Negative.  Negative for itching and rash.  Neurological: Positive for tingling. Negative for focal weakness and weakness.  Endo/Heme/Allergies: Does not bruise/bleed easily.  Psychiatric/Behavioral: Negative for depression. The patient is not nervous/anxious and does not have insomnia.     PAST MEDICAL HISTORY :  Past Medical History:  Diagnosis Date  . Diabetes mellitus without complication ([place])   . Diffuse large B-cell lymphoma ([facility])    Chemo + rad tx's  . Dysplastic nevus 03/14/2020   left inf med scapula moderate atypia   . Dysplastic nevus 03/14/2020   left lat breast parallel to areaola, moderate atypia   .  Heart murmur   . History of chemotherapy 07/08/2017  . History of gastric ulcer   . History of radiation therapy 07/08/2017  .  Hypercholesterolemia   . Hypertension   . ILD (interstitial lung disease) ([place])    8 yrs ago  . Lymphadenopathy     PAST SURGICAL HISTORY :   Past Surgical History:  Procedure Laterality Date  . BREAST BIOPSY Left 2010   neg  . CATARACT EXTRACTION W/PHACO Left 10/16/2017   Procedure: CATARACT EXTRACTION PHACO AND INTRAOCULAR LENS PLACEMENT ([place]) COMPLICATED DIABETIC LEFT;  Surgeon: Leandrew Koyanagi, MD;  Location: [facility];  Service: Ophthalmology;  Laterality: Left;  IRIS HOOKS Diabetic - oral meds  . HERNIA REPAIR    . PARTIAL HYSTERECTOMY     age 18  . PERIPHERAL VASCULAR CATHETERIZATION N/A 08/22/2016   Procedure: [name];  Surgeon: [name], MD;  Location: [facility];  Service: Cardiovascular;  Laterality: N/A;  . TONSILLECTOMY      FAMILY HISTORY :   Family History  Problem Relation Age of Onset  . Heart disease Mother   . Heart disease Father   . Heart disease Brother     SOCIAL HISTORY:   Social History   Tobacco Use  . Smoking status: Former Smoker    Quit date: 08/21/1958    Years since quitting: 62.3  . Smokeless tobacco: Never Used  Vaping Use  . Vaping Use: Never used  Substance Use Topics  . Alcohol use: No  . Drug use: No    ALLERGIES:  is allergic to levaquin [levofloxacin in d5w], cefuroxime axetil, and doxycycline.  MEDICATIONS:  Current Outpatient Medications  Medication Sig Dispense Refill  . b complex vitamins tablet Take 1 tablet by mouth daily.    Marland Kitchen gabapentin (NEURONTIN) 300 MG capsule Take 1 capsule (300 mg total) by mouth 2 (two) times daily. (Patient taking differently: Take 300 mg by mouth 3 (three) times daily. 1 in am and 2 at night) 60 capsule 3  . Magnesium 400 MG TABS Take 1 tablet by mouth daily.    . magnesium oxide (MAG-OX) 400 MG tablet Take 1 tablet by mouth daily.    . metoprolol tartrate (LOPRESSOR) 25 MG tablet TAKE 1 TABLET BY MOUTH TWICE DAILY (Patient taking differently: 12.5  mg 2 (two) times daily. (BETA BLOCKER)) 180 tablet 0  . montelukast (SINGULAIR) 10 MG tablet Take 1 tablet (10 mg total) by mouth at bedtime. One a day. 30 tablet 11  . ondansetron (ZOFRAN) 8 MG tablet Take by mouth.    . pantoprazole (PROTONIX) 40 MG tablet Take 1 tablet by mouth in the morning and at bedtime.    . potassium chloride (KLOR-CON) 10 MEQ tablet Take by mouth.    . pravastatin (PRAVACHOL) 40 MG tablet Take 40 mg by mouth daily.    . valACYclovir (VALTREX) 500 MG tablet Take 1 tablet by mouth every morning.    . latanoprost (XALATAN) 0.005 % ophthalmic solution Place 1 drop into both eyes at bedtime.      No current facility-administered medications for this visit.   Facility-Administered Medications Ordered in Other Visits  Medication Dose Route Frequency Provider Last Rate Last Admin  . 0.9 %  sodium chloride infusion   Intravenous Continuous Cammie Sickle, MD 10 mL/hr at 12/05/16 1030 New Bag at 12/05/16 1030  . 0.9 %  sodium chloride infusion   Intravenous Continuous Cammie Sickle, MD   Stopped at 12/26/16 1011  .  0.9 %  sodium chloride infusion   Intravenous Continuous Charlaine Dalton R, MD 10 mL/hr at 12/27/16 0930 New Bag at 12/27/16 0930  . heparin lock flush 100 unit/mL  500 Units Intravenous Once Charlaine Dalton R, MD      . heparin lock flush 100 unit/mL  500 Units Intravenous Once Charlaine Dalton R, MD      . sodium chloride flush (NS) 0.9 % injection 10 mL  10 mL Intravenous PRN Cammie Sickle, MD   10 mL at 11/06/16 1000  . sodium chloride flush (NS) 0.9 % injection 10 mL  10 mL Intravenous PRN Cammie Sickle, MD   10 mL at 11/07/16 0905  . sodium chloride flush (NS) 0.9 % injection 10 mL  10 mL Intravenous PRN Charlaine Dalton R, MD      . sodium chloride flush (NS) 0.9 % injection 10 mL  10 mL Intravenous PRN Cammie Sickle, MD   10 mL at 12/05/16 1030  . sodium chloride flush (NS) 0.9 % injection 10 mL  10 mL  Intravenous PRN Cammie Sickle, MD   10 mL at 12/27/16 0930  . sodium chloride flush (NS) 0.9 % injection 10 mL  10 mL Intravenous PRN Cammie Sickle, MD   10 mL at 09/27/20 1305    PHYSICAL EXAMINATION: ECOG PERFORMANCE STATUS: 1 - Symptomatic but completely ambulatory  BP 133/64 (BP Location: Left Arm, Patient Position: Sitting, Cuff Size: Normal)   Pulse 77   Temp 99.2 F (37.3 C) (Tympanic)   Resp 16   Ht 5\' 7"  (1.702 m)   Wt 148 lb (67.1 kg)   SpO2 99%   BMI 23.18 kg/m   Filed Weights   12/27/20 1453  Weight: 148 lb (67.1 kg)    Physical Exam Constitutional:      Comments: She is alone.  Walking independently.  HENT:     Head: Normocephalic and atraumatic.     Mouth/Throat:     Pharynx: No oropharyngeal exudate.  Eyes:     Pupils: Pupils are equal, round, and reactive to light.  Cardiovascular:     Rate and Rhythm: Normal rate and regular rhythm.  Pulmonary:     Effort: No respiratory distress.     Breath sounds: No wheezing.     Comments: Bilateral crackles at the bases.  No wheeze. Abdominal:     General: Bowel sounds are normal. There is no distension.     Palpations: Abdomen is soft. There is no mass.     Tenderness: There is no abdominal tenderness. There is no guarding or rebound.  Musculoskeletal:        General: No tenderness. Normal range of motion.     Cervical back: Normal range of motion and neck supple.  Skin:    General: Skin is warm.  Neurological:     Mental Status: She is alert and oriented to person, place, and time.  Psychiatric:        Mood and Affect: Affect normal.    LABORATORY DATA:  I have reviewed the data as listed    Component Value Date/Time   NA 139 12/27/2020 1428   K 3.6 12/27/2020 1428   K 4.1 07/01/2014 1429   CL 103 12/27/2020 1428   CO2 25 12/27/2020 1428   GLUCOSE 96 12/27/2020 1428   BUN 11 12/27/2020 1428   CREATININE 0.62 12/27/2020 1428   CALCIUM 9.0 12/27/2020 1428   PROT 6.5 12/27/2020 1428    ALBUMIN  4.1 12/27/2020 1428   AST 18 12/27/2020 1428   ALT 11 12/27/2020 1428   ALKPHOS 54 12/27/2020 1428   BILITOT 0.4 12/27/2020 1428   GFRNONAA >60 12/27/2020 1428   GFRAA >60 03/28/2020 1305    No results found for: SPEP, UPEP  Lab Results  Component Value Date   WBC 4.0 12/27/2020   NEUTROABS 2.2 12/27/2020   HGB 11.7 (L) 12/27/2020   HCT 35.3 (L) 12/27/2020   MCV 93.1 12/27/2020   PLT 177 12/27/2020      Chemistry      Component Value Date/Time   NA 139 12/27/2020 1428   K 3.6 12/27/2020 1428   K 4.1 07/01/2014 1429   CL 103 12/27/2020 1428   CO2 25 12/27/2020 1428   BUN 11 12/27/2020 1428   CREATININE 0.62 12/27/2020 1428      Component Value Date/Time   CALCIUM 9.0 12/27/2020 1428   ALKPHOS 54 12/27/2020 1428   AST 18 12/27/2020 1428   ALT 11 12/27/2020 1428   BILITOT 0.4 12/27/2020 1428       RADIOGRAPHIC STUDIES: I have personally reviewed the radiological images as listed and agreed with the findings in the report. No results found.   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 No clinical evidence of progression at this time. MARCH 2022- PET STABLE; awaiting repeat scan in sep 2022.  # Prophylactic antibiotics with Valtrex and Bactrim.  # Peripheral neuropathy/left foot drop-STABLE>   # ILD- STABLE.   # Mediport-IV access-stable; port flush.  # DISPOSITION: # port flush in 3 months  # follow up in 6 month-MD; labs- cbc/bmp/LDH; port flush- Dr.B   No orders of the defined types were placed in this encounter.  All questions were answered. The patient knows to call the clinic with any problems, questions or concerns.      Cammie Sickle, MD 12/27/2020 3:53 PM

## 2020-12-27 NOTE — Progress Notes (Signed)
Getting over a really bad bladder infection that she had since 2/1.

## 2021-03-23 ENCOUNTER — Encounter: Payer: Medicare PPO | Admitting: Dermatology

## 2021-03-28 ENCOUNTER — Inpatient Hospital Stay: Payer: Medicare PPO | Attending: Internal Medicine

## 2021-03-28 DIAGNOSIS — C8333 Diffuse large B-cell lymphoma, intra-abdominal lymph nodes: Secondary | ICD-10-CM | POA: Diagnosis present

## 2021-03-28 DIAGNOSIS — Z452 Encounter for adjustment and management of vascular access device: Secondary | ICD-10-CM | POA: Insufficient documentation

## 2021-03-28 DIAGNOSIS — Z95828 Presence of other vascular implants and grafts: Secondary | ICD-10-CM

## 2021-03-28 MED ORDER — SODIUM CHLORIDE 0.9% FLUSH
10.0000 mL | Freq: Once | INTRAVENOUS | Status: AC
Start: 2021-03-28 — End: 2021-03-28
  Administered 2021-03-28: 10 mL via INTRAVENOUS
  Filled 2021-03-28: qty 10

## 2021-03-28 MED ORDER — HEPARIN SOD (PORK) LOCK FLUSH 100 UNIT/ML IV SOLN
500.0000 [IU] | Freq: Once | INTRAVENOUS | Status: AC
  Administered 2021-03-28: 500 [IU] via INTRAVENOUS
  Filled 2021-03-28: qty 5

## 2021-06-08 ENCOUNTER — Telehealth: Payer: Self-pay | Admitting: *Deleted

## 2021-06-08 DIAGNOSIS — K6389 Other specified diseases of intestine: Secondary | ICD-10-CM

## 2021-06-08 NOTE — Telephone Encounter (Signed)
Per v/o Dr. Rogue Bussing - pt needs to be referred to [facility] regarding colon mass/ need for colonoscopy urgent

## 2021-06-08 NOTE — Telephone Encounter (Signed)
Referral faxed to [facility]

## 2021-06-16 ENCOUNTER — Encounter: Payer: Self-pay | Admitting: Gastroenterology

## 2021-06-19 ENCOUNTER — Ambulatory Visit: Payer: Medicare PPO | Admitting: Anesthesiology

## 2021-06-19 ENCOUNTER — Encounter
Admission: RE | Disposition: A | Discharge status: Home / Self Care | Payer: Self-pay | Source: Home / Self Care | Attending: Gastroenterology

## 2021-06-19 ENCOUNTER — Ambulatory Visit
Admission: RE | Admit: 2021-06-19 | Discharge: 2021-06-19 | Disposition: A | Discharge status: Home / Self Care | Payer: Medicare PPO | Attending: Gastroenterology | Admitting: Gastroenterology

## 2021-06-19 ENCOUNTER — Encounter: Payer: Self-pay | Admitting: Gastroenterology

## 2021-06-19 DIAGNOSIS — Z8572 Personal history of non-Hodgkin lymphomas: Secondary | ICD-10-CM | POA: Insufficient documentation

## 2021-06-19 DIAGNOSIS — Z9889 Other specified postprocedural states: Secondary | ICD-10-CM | POA: Diagnosis not present

## 2021-06-19 DIAGNOSIS — Z9221 Personal history of antineoplastic chemotherapy: Secondary | ICD-10-CM | POA: Diagnosis not present

## 2021-06-19 DIAGNOSIS — D123 Benign neoplasm of transverse colon: Secondary | ICD-10-CM | POA: Diagnosis not present

## 2021-06-19 DIAGNOSIS — K319 Disease of stomach and duodenum, unspecified: Secondary | ICD-10-CM | POA: Insufficient documentation

## 2021-06-19 DIAGNOSIS — Z923 Personal history of irradiation: Secondary | ICD-10-CM | POA: Insufficient documentation

## 2021-06-19 DIAGNOSIS — R197 Diarrhea, unspecified: Secondary | ICD-10-CM | POA: Insufficient documentation

## 2021-06-19 DIAGNOSIS — K2289 Other specified disease of esophagus: Secondary | ICD-10-CM | POA: Diagnosis not present

## 2021-06-19 DIAGNOSIS — R634 Abnormal weight loss: Secondary | ICD-10-CM | POA: Insufficient documentation

## 2021-06-19 DIAGNOSIS — Z8711 Personal history of peptic ulcer disease: Secondary | ICD-10-CM | POA: Insufficient documentation

## 2021-06-19 DIAGNOSIS — Z881 Allergy status to other antibiotic agents status: Secondary | ICD-10-CM | POA: Insufficient documentation

## 2021-06-19 DIAGNOSIS — Z79899 Other long term (current) drug therapy: Secondary | ICD-10-CM | POA: Insufficient documentation

## 2021-06-19 DIAGNOSIS — K5730 Diverticulosis of large intestine without perforation or abscess without bleeding: Secondary | ICD-10-CM | POA: Diagnosis not present

## 2021-06-19 DIAGNOSIS — R948 Abnormal results of function studies of other organs and systems: Secondary | ICD-10-CM | POA: Diagnosis present

## 2021-06-19 DIAGNOSIS — Z87891 Personal history of nicotine dependence: Secondary | ICD-10-CM | POA: Diagnosis not present

## 2021-06-19 HISTORY — PX: COLONOSCOPY: SHX5424

## 2021-06-19 HISTORY — PX: ESOPHAGOGASTRODUODENOSCOPY: SHX5428

## 2021-06-19 SURGERY — COLONOSCOPY
Anesthesia: General

## 2021-06-19 MED ORDER — SODIUM CHLORIDE 0.9 % IV SOLN: INTRAVENOUS | Status: DC

## 2021-06-19 MED ORDER — LIDOCAINE HCL (CARDIAC) PF 100 MG/5ML IV SOSY
PREFILLED_SYRINGE | INTRAVENOUS | Status: DC | PRN
  Administered 2021-06-19: 60 mg via INTRAVENOUS

## 2021-06-19 MED ORDER — PHENYLEPHRINE HCL (PRESSORS) 10 MG/ML IV SOLN
INTRAVENOUS | Status: DC | PRN
  Administered 2021-06-19: 80 ug via INTRAVENOUS

## 2021-06-19 MED ORDER — PROPOFOL 10 MG/ML IV BOLUS
INTRAVENOUS | Status: DC | PRN
  Administered 2021-06-19: 60 mg via INTRAVENOUS

## 2021-06-19 MED ORDER — PROPOFOL 500 MG/50ML IV EMUL
INTRAVENOUS | Status: DC | PRN
  Administered 2021-06-19: 140 ug/kg/min via INTRAVENOUS

## 2021-06-19 MED ORDER — DEXMEDETOMIDINE (PRECEDEX) IN NS 20 MCG/5ML (4 MCG/ML) IV SYRINGE: PREFILLED_SYRINGE | INTRAVENOUS | Status: DC | PRN

## 2021-06-19 NOTE — Anesthesia Preprocedure Evaluation (Signed)
Anesthesia Evaluation  Patient identified by MRN, date of birth, ID band Patient awake    Reviewed: Allergy & Precautions, NPO status , Patient's Chart, lab work & pertinent test results  History of Anesthesia Complications Negative for: history of anesthetic complications  Airway Mallampati: II  TM Distance: >3 FB Neck ROM: Full    Dental no notable dental hx.    Pulmonary neg sleep apnea, neg COPD, former smoker,    breath sounds clear to auscultation- rhonchi (-) wheezing      Cardiovascular hypertension, Pt. on medications (-) CAD, (-) Past MI, (-) Cardiac Stents and (-) CABG  Rhythm:Regular Rate:Normal - Systolic murmurs and - Diastolic murmurs    Neuro/Psych neg Seizures negative neurological ROS  negative psych ROS   GI/Hepatic negative GI ROS, Neg liver ROS,   Endo/Other  diabetes  Renal/GU negative Renal ROS     Musculoskeletal negative musculoskeletal ROS (+)   Abdominal (+) - obese,   Peds  Hematology negative hematology ROS (+)   Anesthesia Other Findings Past Medical History: No date: Diabetes mellitus without complication ([place]) No date: Diffuse large B-cell lymphoma ([place])     Comment:  Chemo + rad tx's 03/14/2020: Dysplastic nevus     Comment:  left inf med scapula moderate atypia  03/14/2020: Dysplastic nevus     Comment:  left lat breast parallel to areaola, moderate atypia  No date: Heart murmur 07/08/2017: History of chemotherapy No date: History of gastric ulcer 07/08/2017: History of radiation therapy No date: Hypercholesterolemia No date: Hypertension No date: ILD (interstitial lung disease) ([place])     Comment:  8 yrs ago No date: Lymphadenopathy   Reproductive/Obstetrics                             Anesthesia Physical Anesthesia Plan  ASA: 2  Anesthesia Plan: General   Post-op Pain Management:    Induction: Intravenous  PONV Risk Score and Plan: 2  and Propofol infusion  Airway Management Planned: Natural Airway  Additional Equipment:   Intra-op Plan:   Post-operative Plan:   Informed Consent: I have reviewed the patients History and Physical, chart, labs and discussed the procedure including the risks, benefits and alternatives for the proposed anesthesia with the patient or authorized representative who has indicated his/her understanding and acceptance.     Dental advisory given  Plan Discussed with: CRNA and Anesthesiologist  Anesthesia Plan Comments:         Anesthesia Quick Evaluation

## 2021-06-19 NOTE — Transfer of Care (Signed)
Immediate Anesthesia Transfer of Care Note  Patient: [place]  Procedure(s) Performed: COLONOSCOPY ESOPHAGOGASTRODUODENOSCOPY (EGD)  Patient Location: PACU  Anesthesia Type:General  Level of Consciousness: awake, alert  and oriented  Airway & Oxygen Therapy: Patient Spontanous Breathing  Post-op Assessment: Report given to RN and Post -op Vital signs reviewed and stable  Post vital signs: Reviewed and stable  Last Vitals:  Vitals Value Taken Time  BP    Temp    Pulse    Resp    SpO2      Last Pain:  Vitals:   06/19/21 1204  TempSrc: Temporal  PainSc: 0-No pain         Complications: No notable events documented.

## 2021-06-19 NOTE — Interval H&P Note (Signed)
History and Physical Interval Note: Preprocedure H&P from 06/19/21  was reviewed and there was no interval change after seeing and examining the patient.  Written consent was obtained from the patient after discussion of risks, benefits, and alternatives. Patient has consented to proceed with Esophagogastroduodenoscopy and Colonoscopy with possible intervention   06/19/2021 12:31 PM  [place]  has presented today for surgery, with the diagnosis of Abnormal PET scan of colon (R94.8) Diarrhea, unspecified type (R19.7).  The various methods of treatment have been discussed with the patient and family. After consideration of risks, benefits and other options for treatment, the patient has consented to  Procedure(s): COLONOSCOPY (N/A) ESOPHAGOGASTRODUODENOSCOPY (EGD) (N/A) as a surgical intervention.  The patient's history has been reviewed, patient examined, no change in status, stable for surgery.  I have reviewed the patient's chart and labs.  Questions were answered to the patient's satisfaction.     Annamaria Helling

## 2021-06-19 NOTE — H&P (Signed)
[facility] Pre-Procedure H&P   Patient ID: Rose Hill is a 75 y.o. female.  Gastroenterology Provider: Annamaria Helling, DO  Referring Provider: Laurine Blazer, PA PCP: Rusty Aus, MD  Date: 06/19/2021  HPI Rose Hill is a 75 y.o. female who presents today for Esophagogastroduodenoscopy and Colonoscopy for diarrhea, abnormal pet imaging.  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 Was evaluated in office for diarrhea and abnormal PET scan demonstrating increased uptake at the splenic flexure with wall thickening. Was due for colonoscopy in 2020, but canceled 2/2 covid pandemic.  Seen in office in October noting 3 weeks of diarrhea up to 4 x per day with urgency and cramping. Rare nocturnal diarrhea. Nausea 2-3 times per week. No blood/melena noted in stool. Had been on abx for uti around time of diarrhea as well.  Reports weight loss 12-13 lbs in 6 months approximately  Denies gerd, dysphagia, odynophagia  Hgb 11.4.  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   left lat breast parallel to areaola, moderate atypia    Heart murmur    History of chemotherapy 07/08/2017   History of gastric ulcer    History of radiation therapy 07/08/2017   Hypercholesterolemia    Hypertension    ILD (interstitial lung disease) ([place])    8 yrs ago   Lymphadenopathy     Past Surgical History:  Procedure Laterality Date   BREAST BIOPSY Left 2010   neg   CATARACT EXTRACTION W/PHACO Left 10/16/2017   Procedure: CATARACT EXTRACTION PHACO AND INTRAOCULAR LENS PLACEMENT ([place]) COMPLICATED DIABETIC LEFT;  Surgeon: Leandrew Koyanagi,  MD;  Location: [facility];  Service: Ophthalmology;  Laterality: Left;  IRIS HOOKS Diabetic - oral meds   HERNIA REPAIR     PARTIAL HYSTERECTOMY     age 40   PERIPHERAL VASCULAR CATHETERIZATION N/A 08/22/2016   Procedure: [name];  Surgeon: [name], MD;  Location: [place] [facility];  Service: Cardiovascular;  Laterality: N/A;   TONSILLECTOMY      Family History No h/o GI disease or malignancy  Review of Systems  Constitutional:  Positive for appetite change (decreased) and unexpected weight change. Negative for activity change, fatigue and fever.  HENT:  Negative for trouble swallowing and voice change.   Respiratory:  Negative for shortness of breath and wheezing.   Cardiovascular:  Negative for chest pain and palpitations.  Gastrointestinal:  Positive for diarrhea. Negative for abdominal distention, abdominal pain, anal bleeding, blood in stool, constipation, nausea, rectal pain and vomiting.  Musculoskeletal:  Negative for arthralgias and myalgias.  Skin:  Negative for color change and pallor.  Neurological:  Negative for dizziness, syncope and weakness.  Psychiatric/Behavioral:  Negative for confusion.   All other systems reviewed and are negative.   Medications Current Facility-Administered Medications on File Prior to Encounter  Medication Dose Route Frequency Provider Last Rate Last Admin   0.9 %  sodium chloride infusion   Intravenous Continuous Cammie Sickle, MD 10 mL/hr at 12/05/16 1030 New Bag at 12/05/16 1030   0.9 %  sodium chloride infusion   Intravenous Continuous Cammie Sickle, MD  Stopped at 12/26/16 1011   0.9 %  sodium chloride infusion   Intravenous Continuous Charlaine Dalton R, MD 10 mL/hr at 12/27/16 0930 New Bag at 12/27/16 0930   heparin lock flush 100 unit/mL  500 Units Intravenous Once Charlaine Dalton R, MD       heparin lock flush 100 unit/mL  500 Units Intravenous Once Charlaine Dalton R, MD        sodium chloride flush (NS) 0.9 % injection 10 mL  10 mL Intravenous PRN Charlaine Dalton R, MD   10 mL at 11/06/16 1000   sodium chloride flush (NS) 0.9 % injection 10 mL  10 mL Intravenous PRN Cammie Sickle, MD   10 mL at 11/07/16 0905   sodium chloride flush (NS) 0.9 % injection 10 mL  10 mL Intravenous PRN Cammie Sickle, MD       sodium chloride flush (NS) 0.9 % injection 10 mL  10 mL Intravenous PRN Charlaine Dalton R, MD   10 mL at 12/05/16 1030   sodium chloride flush (NS) 0.9 % injection 10 mL  10 mL Intravenous PRN Charlaine Dalton R, MD   10 mL at 12/27/16 0930   sodium chloride flush (NS) 0.9 % injection 10 mL  10 mL Intravenous PRN Cammie Sickle, MD   10 mL at 09/27/20 1305   Current Outpatient Medications on File Prior to Encounter  Medication Sig Dispense Refill   hyoscyamine (LEVSIN SL) 0.125 MG SL tablet Place 0.125 mg under the tongue every 4 (four) hours as needed.     Magnesium 400 MG TABS Take 1 tablet by mouth daily.     magnesium oxide (MAG-OX) 400 MG tablet Take 1 tablet by mouth daily.     metoprolol tartrate (LOPRESSOR) 25 MG tablet TAKE 1 TABLET BY MOUTH TWICE DAILY (Patient taking differently: 12.5 mg 2 (two) times daily. (BETA BLOCKER)) 180 tablet 0   ondansetron (ZOFRAN) 8 MG tablet Take by mouth.     pantoprazole (PROTONIX) 40 MG tablet Take 1 tablet by mouth in the morning and at bedtime.     potassium chloride (KLOR-CON) 10 MEQ tablet Take by mouth.     pravastatin (PRAVACHOL) 40 MG tablet Take 40 mg by mouth daily.     valACYclovir (VALTREX) 500 MG tablet Take 1 tablet by mouth every morning.     b complex vitamins tablet Take 1 tablet by mouth daily.     gabapentin (NEURONTIN) 300 MG capsule Take 1 capsule (300 mg total) by mouth 2 (two) times daily. (Patient taking differently: Take 300 mg by mouth 3 (three) times daily. 1 in am and 2 at night) 60 capsule 3   latanoprost (XALATAN) 0.005 % ophthalmic solution Place 1 drop into  both eyes at bedtime.      montelukast (SINGULAIR) 10 MG tablet Take 1 tablet (10 mg total) by mouth at bedtime. One a day. (Patient not taking: Reported on 06/19/2021) 30 tablet 11   prochlorperazine (COMPAZINE) 10 MG tablet Take 10 mg by mouth every 6 (six) hours as needed for nausea or vomiting. (Patient not taking: Reported on 06/19/2021)     triamterene-hydrochlorothiazide (DYAZIDE) 37.5-25 MG capsule Take 1 capsule by mouth daily. (Patient not taking: Reported on 06/19/2021)      Pertinent medications related to GI and procedure were reviewed by me with the patient prior to the procedure   Current Facility-Administered Medications:    0.9 %  sodium chloride infusion, , Intravenous, Continuous, Annamaria Helling, DO  Facility-Administered Medications Ordered in Other Encounters:    0.9 %  sodium chloride infusion, , Intravenous, Continuous, Rogue Bussing, Elisha Headland, MD, Last Rate: 10 mL/hr at 12/05/16 1030, New Bag at 12/05/16 1030   0.9 %  sodium chloride infusion, , Intravenous, Continuous, Brahmanday, Elisha Headland, MD, Stopped at 12/26/16 1011   0.9 %  sodium chloride infusion, , Intravenous, Continuous, Cammie Sickle, MD, Last Rate: 10 mL/hr at 12/27/16 0930, New Bag at 12/27/16 0930   heparin lock flush 100 unit/mL, 500 Units, Intravenous, Once, Charlaine Dalton R, MD   heparin lock flush 100 unit/mL, 500 Units, Intravenous, Once, Charlaine Dalton R, MD   sodium chloride flush (NS) 0.9 % injection 10 mL, 10 mL, Intravenous, PRN, Charlaine Dalton R, MD, 10 mL at 11/06/16 1000   sodium chloride flush (NS) 0.9 % injection 10 mL, 10 mL, Intravenous, PRN, Charlaine Dalton R, MD, 10 mL at 11/07/16 0905   sodium chloride flush (NS) 0.9 % injection 10 mL, 10 mL, Intravenous, PRN, Cammie Sickle, MD   sodium chloride flush (NS) 0.9 % injection 10 mL, 10 mL, Intravenous, PRN, Charlaine Dalton R, MD, 10 mL at 12/05/16 1030   sodium chloride flush (NS) 0.9 % injection  10 mL, 10 mL, Intravenous, PRN, Charlaine Dalton R, MD, 10 mL at 12/27/16 0930   sodium chloride flush (NS) 0.9 % injection 10 mL, 10 mL, Intravenous, PRN, Cammie Sickle, MD, 10 mL at 09/27/20 1305  sodium chloride         Allergies  Allergen Reactions   Levaquin [Levofloxacin In D5w] Palpitations and Other (See Comments)    Burning in lower legs   Cefuroxime Axetil Diarrhea    Other reaction(s): Abdominal Pain   Doxycycline Other (See Comments)    Other reaction(s): Abdominal Pain severe   Allergies were reviewed by me prior to the procedure  Objective    Vitals:   06/19/21 1204  BP: (!) 158/85  Pulse: (!) 103  Resp: 18  Temp: (!) 96 F (35.6 C)  TempSrc: Temporal  SpO2: 98%  Weight: 61.2 kg  Height: 5\' 7"  (1.702 m)     Physical Exam Vitals reviewed.  Constitutional:      General: She is not in acute distress.    Appearance: Normal appearance. She is normal weight. She is not ill-appearing, toxic-appearing or diaphoretic.  HENT:     Head: Normocephalic and atraumatic.     Nose: Nose normal.     Mouth/Throat:     Mouth: Mucous membranes are moist.     Pharynx: Oropharynx is clear.  Eyes:     General: No scleral icterus.    Extraocular Movements: Extraocular movements intact.  Cardiovascular:     Rate and Rhythm: Regular rhythm. Tachycardia present.     Heart sounds: Normal heart sounds. No murmur heard.   No friction rub. No gallop.  Pulmonary:     Effort: Pulmonary effort is normal. No respiratory distress.     Breath sounds: Normal breath sounds. No wheezing, rhonchi or rales.  Abdominal:     General: Abdomen is flat. Bowel sounds are normal. There is no distension.     Palpations: Abdomen is soft.     Tenderness: There is no abdominal tenderness. There is no guarding or rebound.  Musculoskeletal:     Cervical back: Neck supple.     Right lower leg: No edema.     Left lower leg: No edema.  Skin:    General: Skin is warm and  dry.      Coloration: Skin is not jaundiced or pale.  Neurological:     General: No focal deficit present.     Mental Status: She is alert and oriented to person, place, and time. Mental status is at baseline.  Psychiatric:        Mood and Affect: Mood normal.        Behavior: Behavior normal.        Thought Content: Thought content normal.        Judgment: Judgment normal.     Assessment:  Rose Hill is a 75 y.o. female  who presents today for Esophagogastroduodenoscopy and Colonoscopy for diarrhea, abnormal pet scan.  Plan:  Esophagogastroduodenoscopy and Colonoscopy with possible intervention today  Esophagogastroduodenoscopy and colonoscopy with possible biopsy, control of bleeding, polypectomy, and interventions as necessary has been discussed with the patient/patient representative. Informed consent was obtained from the patient/patient representative after explaining the indication, nature, and risks of the procedure including but not limited to death, bleeding, perforation, missed neoplasm/lesions, cardiorespiratory compromise, and reaction to medications. Opportunity for questions was given and appropriate answers were provided. Patient/patient representative has verbalized understanding is amenable to undergoing the procedure.   Annamaria Helling, DO  [facility]  Portions of the record may have been created with voice recognition software. Occasional wrong-word or 'sound-a-like' substitutions may have occurred due to the inherent limitations of voice recognition software.  Read the chart carefully and recognize, using context, where substitutions may have occurred.

## 2021-06-19 NOTE — Op Note (Addendum)
[facility] Patient Name: Rose Hill Procedure Date: 06/19/2021 12:32 PM MRN: [id number] Account #: [id number] Date of Birth: 07/14/1946 Admit Type: Outpatient Age: 75 Room: [facility] ENDO ROOM 2 Gender: Female Note Status: Finalized Instrument Name: Colonoscope [id number] Procedure:             Colonoscopy Indications:           Abnormal PET scan of the GI tract Providers:             Rueben Bash, DO Referring MD:          Rusty Aus, MD (Referring MD) Medicines:             Monitored Anesthesia Care Complications:         No immediate complications. Estimated blood loss:                         Minimal. Procedure:             Pre-Anesthesia Assessment:                        - Prior to the procedure, a History and Physical was                         performed, and patient medications and allergies were                         reviewed. The patient is competent. The risks and                         benefits of the procedure and the sedation options and                         risks were discussed with the patient. All questions                         were answered and informed consent was obtained.                         Patient identification and proposed procedure were                         verified by the physician, the nurse, the anesthetist                         and the technician in the endoscopy suite. Mental                         Status Examination: alert and oriented. Airway                         Examination: normal oropharyngeal airway and neck                         mobility. Respiratory Examination: clear to                         auscultation. CV Examination: RRR, no murmurs, no S3  or S4. Prophylactic Antibiotics: The patient does not                         require prophylactic antibiotics. Prior                         Anticoagulants: The patient has taken no previous                          anticoagulant or antiplatelet agents. ASA Grade                         Assessment: II - A patient with mild systemic disease.                         After reviewing the risks and benefits, the patient                         was deemed in satisfactory condition to undergo the                         procedure. The anesthesia plan was to use monitored                         anesthesia care (MAC). Immediately prior to                         administration of medications, the patient was                         re-assessed for adequacy to receive sedatives. The                         heart rate, respiratory rate, oxygen saturations,                         blood pressure, adequacy of pulmonary ventilation, and                         response to care were monitored throughout the                         procedure. The physical status of the patient was                         re-assessed after the procedure.                        After obtaining informed consent, the colonoscope was                         passed under direct vision. Throughout the procedure,                         the patient's blood pressure, pulse, and oxygen                         saturations were monitored continuously. The  Colonoscope was introduced through the anus and                         advanced to the the cecum, identified by appendiceal                         orifice and ileocecal valve. The colonoscopy was                         performed without difficulty. The patient tolerated                         the procedure well. The quality of the bowel                         preparation was evaluated using the BBPS [facility] Bowel                         Preparation Scale) with scores of: Right Colon = 2                         (minor amount of residual staining, small fragments of                         stool and/or opaque liquid, but mucosa seen well),                          Transverse Colon = 3 (entire mucosa seen well with no                         residual staining, small fragments of stool or opaque                         liquid) and Left Colon = 3 (entire mucosa seen well                         with no residual staining, small fragments of stool or                         opaque liquid). The total BBPS score equals 8. The                         quality of the bowel preparation was excellent. The                         ileocecal valve, appendiceal orifice, and rectum were                         photographed. Findings:      The perianal and digital rectal examinations were normal. Pertinent       negatives include normal sphincter tone.      Many small-mouthed diverticula were found in the left colon. Estimated       blood loss: none.      A 9 to 11 mm polyp was found in the hepatic flexure. The polyp was       sessile. The polyp was removed with a cold  snare. Resection and       retrieval were complete. Removed in two cuts. Forceps used around the       edges to remove any further polypoid appearing tissue      The exam was otherwise without abnormality on direct and retroflexion       views.      No findings noted at splenic flexure as noted on PET CT      Normal mucosa was found in the entire colon. Biopsies for histology were       taken with a cold forceps from the right colon and left colon for       evaluation of microscopic colitis. Estimated blood loss was minimal. Impression:            - Diverticulosis in the left colon.                        - One 9 to 11 mm polyp at the hepatic flexure, removed                         with a cold snare. Resected and retrieved.                        - The examination was otherwise normal on direct and                         retroflexion views.                        - No findings noted at splenic flexure as noted on PET                         CT Recommendation:        - Discharge patient to  home.                        - Resume previous diet.                        - Continue present medications.                        - Await pathology results.                        - Repeat colonoscopy for surveillance based on                         pathology results.                        - Return to referring physician as previously                         scheduled. Procedure Code(s):     --- Professional ---                        [phone], Colonoscopy, flexible; with removal of                         tumor(s), polyp(s), or other lesion(s) by snare  technique Diagnosis Code(s):     --- Professional ---                        K63.5, Polyp of colon                        K57.30, Diverticulosis of large intestine without                         perforation or abscess without bleeding                        R93.3, Abnormal findings on diagnostic imaging of                         other parts of digestive tract CPT copyright 2019 [employer] Association. All rights reserved. The codes documented in this report are preliminary and upon coder review may  be revised to meet current compliance requirements. Attending Participation:      I personally performed the entire procedure. Volney American, DO Annamaria Helling DO, DO 06/19/2021 1:32:05 PM This report has been signed electronically. Number of Addenda: 0 Note Initiated On: 06/19/2021 12:32 PM Scope Withdrawal Time: 0 hours 19 minutes 20 seconds  Total Procedure Duration: 0 hours 34 minutes 21 seconds  Estimated Blood Loss:  Estimated blood loss was minimal.      [facility]

## 2021-06-19 NOTE — Anesthesia Postprocedure Evaluation (Signed)
Anesthesia Post Note  Patient: Rose Hill  Procedure(s) Performed: COLONOSCOPY ESOPHAGOGASTRODUODENOSCOPY (EGD)  Patient location during evaluation: Endoscopy Anesthesia Type: General Level of consciousness: awake and alert and oriented Pain management: pain level controlled Vital Signs Assessment: post-procedure vital signs reviewed and stable Respiratory status: spontaneous breathing, nonlabored ventilation and respiratory function stable Cardiovascular status: blood pressure returned to baseline and stable Postop Assessment: no signs of nausea or vomiting Anesthetic complications: no   No notable events documented.   Last Vitals:  Vitals:   06/19/21 1347 06/19/21 1357  BP: (!) 107/37 120/60  Pulse: 74 72  Resp: 18 14  Temp:    SpO2: 100% 96%    Last Pain:  Vitals:   06/19/21 1357  TempSrc:   PainSc: 0-No pain                 Avaree Gilberti

## 2021-06-19 NOTE — Op Note (Signed)
[facility] Gastroenterology Patient Name: Rose Hill Procedure Date: 06/19/2021 12:33 PM MRN: [id number] Account #: [id number] Date of Birth: December 03, 1945 Admit Type: Outpatient Age: 75 Room: [facility] ENDO ROOM 2 Gender: Female Note Status: Finalized Instrument Name: Upper Endoscope [id number] Procedure:             Upper GI endoscopy Indications:           Diarrhea, Weight loss Providers:             Rueben Bash, DO Referring MD:          Rusty Aus, MD (Referring MD) Medicines:             Monitored Anesthesia Care Complications:         No immediate complications. Estimated blood loss:                         Minimal. Procedure:             Pre-Anesthesia Assessment:                        - Prior to the procedure, a History and Physical was                         performed, and patient medications and allergies were                         reviewed. The patient is competent. The risks and                         benefits of the procedure and the sedation options and                         risks were discussed with the patient. All questions                         were answered and informed consent was obtained.                         Patient identification and proposed procedure were                         verified by the physician, the nurse, the anesthetist                         and the technician in the endoscopy suite. Mental                         Status Examination: alert and oriented. Airway                         Examination: normal oropharyngeal airway and neck                         mobility. Respiratory Examination: clear to                         auscultation. CV Examination: RRR, no murmurs, no S3  or S4. Prophylactic Antibiotics: The patient does not                         require prophylactic antibiotics. Prior                         Anticoagulants: The patient has taken no previous                          anticoagulant or antiplatelet agents. ASA Grade                         Assessment: II - A patient with mild systemic disease.                         After reviewing the risks and benefits, the patient                         was deemed in satisfactory condition to undergo the                         procedure. The anesthesia plan was to use monitored                         anesthesia care (MAC). Immediately prior to                         administration of medications, the patient was                         re-assessed for adequacy to receive sedatives. The                         heart rate, respiratory rate, oxygen saturations,                         blood pressure, adequacy of pulmonary ventilation, and                         response to care were monitored throughout the                         procedure. The physical status of the patient was                         re-assessed after the procedure.                        After obtaining informed consent, the endoscope was                         passed under direct vision. Throughout the procedure,                         the patient's blood pressure, pulse, and oxygen                         saturations were monitored continuously. The Endoscope  was introduced through the mouth, and advanced to the                         second part of duodenum. The upper GI endoscopy was                         accomplished without difficulty. The patient tolerated                         the procedure well. Findings:      The duodenal bulb, first portion of the duodenum and second portion of       the duodenum were normal. Biopsies for histology were taken with a cold       forceps for evaluation of celiac disease. Estimated blood loss was       minimal.      The entire examined stomach was normal. Biopsies were taken with a cold       forceps for Helicobacter pylori testing. Estimated blood loss was        minimal.      Evidence of a Nissen fundoplication was found at the gastroesophageal       junction. The wrap appeared tight. This was traversed. Estimated blood       loss: none.      The Z-line was variable and was found and less than 1cm in length.       Estimated blood loss: none.      Esophagogastric landmarks were identified: the gastroesophageal junction       was found at 35 cm from the incisors.      The exam of the esophagus was otherwise normal. Impression:            - Normal duodenal bulb, first portion of the duodenum                         and second portion of the duodenum. Biopsied.                        - Normal stomach. Biopsied.                        - A Nissen fundoplication was found. The wrap appears                         tight.                        - Z-line variable, and less than 1cm in length.                        - Esophagogastric landmarks identified. Recommendation:        - Discharge patient to home.                        - Resume previous diet.                        - Continue present medications.                        - Await pathology results.                        -  Return to referring physician as previously                         scheduled. Procedure Code(s):     --- Professional ---                        [phone], Esophagogastroduodenoscopy, flexible,                         transoral; with biopsy, single or multiple Diagnosis Code(s):     --- Professional ---                        [phone], Other specified postprocedural states                        K22.8, Other specified diseases of esophagus                        R19.7, Diarrhea, unspecified                        R63.4, Abnormal weight loss CPT copyright 2019 American Medical Association. All rights reserved. The codes documented in this report are preliminary and upon coder review may  be revised to meet current compliance requirements. Attending Participation:      I personally  performed the entire procedure. Volney American, DO Annamaria Helling DO, DO 06/19/2021 1:26:49 PM This report has been signed electronically. Number of Addenda: 0 Note Initiated On: 06/19/2021 12:33 PM Estimated Blood Loss:  Estimated blood loss was minimal.      [facility]

## 2021-06-20 ENCOUNTER — Encounter: Payer: Self-pay | Admitting: Gastroenterology

## 2021-06-21 ENCOUNTER — Other Ambulatory Visit: Payer: Self-pay | Admitting: *Deleted

## 2021-06-21 DIAGNOSIS — C8333 Diffuse large B-cell lymphoma, intra-abdominal lymph nodes: Secondary | ICD-10-CM

## 2021-06-21 LAB — SURGICAL PATHOLOGY

## 2021-06-27 ENCOUNTER — Inpatient Hospital Stay: Payer: Medicare PPO | Attending: Internal Medicine

## 2021-06-27 ENCOUNTER — Inpatient Hospital Stay: Payer: Medicare PPO | Admitting: Internal Medicine

## 2021-06-29 ENCOUNTER — Telehealth: Payer: Self-pay | Admitting: Internal Medicine

## 2021-06-29 NOTE — Telephone Encounter (Signed)
Reviewed the recent colonoscopy June 19, 2021-negative for any findings to corroborate with PET findings/October 2022 at [facility]- "splenic flexure uptake".  Discussed with Dr. Thera Flake.    # C-please schedule follow-up in 3 months- MD; labs- cbc/cmp;LDH- Dr.B.

## 2021-06-30 NOTE — Telephone Encounter (Signed)
Apts have been scheduled

## 2021-07-06 ENCOUNTER — Telehealth: Payer: Self-pay | Admitting: Internal Medicine

## 2021-07-06 NOTE — Telephone Encounter (Signed)
Rose Hill has called and r/s her appt for her

## 2021-07-06 NOTE — Telephone Encounter (Signed)
Pt called to reschedule her a appt.  Please call back at [phone]

## 2021-09-28 ENCOUNTER — Inpatient Hospital Stay: Payer: Medicare PPO | Admitting: Internal Medicine

## 2021-09-28 ENCOUNTER — Inpatient Hospital Stay: Payer: Medicare PPO | Attending: Internal Medicine

## 2021-09-28 ENCOUNTER — Other Ambulatory Visit: Payer: Self-pay

## 2021-09-28 ENCOUNTER — Encounter: Payer: Self-pay | Admitting: Internal Medicine

## 2021-09-28 DIAGNOSIS — M21372 Foot drop, left foot: Secondary | ICD-10-CM | POA: Insufficient documentation

## 2021-09-28 DIAGNOSIS — C8333 Diffuse large B-cell lymphoma, intra-abdominal lymph nodes: Secondary | ICD-10-CM

## 2021-09-28 DIAGNOSIS — D849 Immunodeficiency, unspecified: Secondary | ICD-10-CM | POA: Insufficient documentation

## 2021-09-28 DIAGNOSIS — J849 Interstitial pulmonary disease, unspecified: Secondary | ICD-10-CM | POA: Insufficient documentation

## 2021-09-28 DIAGNOSIS — Z9221 Personal history of antineoplastic chemotherapy: Secondary | ICD-10-CM | POA: Insufficient documentation

## 2021-09-28 DIAGNOSIS — Z79899 Other long term (current) drug therapy: Secondary | ICD-10-CM | POA: Insufficient documentation

## 2021-09-28 DIAGNOSIS — D84821 Immunodeficiency due to drugs: Secondary | ICD-10-CM | POA: Diagnosis not present

## 2021-09-28 DIAGNOSIS — Z8572 Personal history of non-Hodgkin lymphomas: Secondary | ICD-10-CM | POA: Diagnosis present

## 2021-09-28 DIAGNOSIS — T451X5A Adverse effect of antineoplastic and immunosuppressive drugs, initial encounter: Secondary | ICD-10-CM

## 2021-09-28 LAB — BASIC METABOLIC PANEL
Anion gap: 10 (ref 5–15)
BUN: 13 mg/dL (ref 8–23)
CO2: 27 mmol/L (ref 22–32)
Calcium: 8.8 mg/dL — ABNORMAL LOW (ref 8.9–10.3)
Chloride: 105 mmol/L (ref 98–111)
Creatinine, Ser: 0.68 mg/dL (ref 0.44–1.00)
GFR, Estimated: >60 mL/min (ref >60)
Glucose, Bld: 114 mg/dL — ABNORMAL HIGH (ref 70–99)
Potassium: 3.8 mmol/L (ref 3.5–5.1)
Sodium: 142 mmol/L (ref 135–145)

## 2021-09-28 LAB — CBC WITH DIFFERENTIAL/PLATELET
Abs Immature Granulocytes: 0.11 10*3/uL — ABNORMAL HIGH (ref 0.00–0.07)
Basophils Absolute: 0 10*3/uL (ref 0.0–0.1)
Basophils Relative: 1 %
Eosinophils Absolute: 0.3 10*3/uL (ref 0.0–0.5)
Eosinophils Relative: 11 %
HCT: 35.5 % — ABNORMAL LOW (ref 36.0–46.0)
Hemoglobin: 12 g/dL (ref 12.0–15.0)
Immature Granulocytes: 4 %
Lymphocytes Relative: 13 %
Lymphs Abs: 0.3 10*3/uL — ABNORMAL LOW (ref 0.7–4.0)
MCH: 32.3 pg (ref 26.0–34.0)
MCHC: 33.8 g/dL (ref 30.0–36.0)
MCV: 95.4 fL (ref 80.0–100.0)
Monocytes Absolute: 0.6 10*3/uL (ref 0.1–1.0)
Monocytes Relative: 22 %
Neutro Abs: 1.3 10*3/uL — ABNORMAL LOW (ref 1.7–7.7)
Neutrophils Relative %: 49 %
Platelets: 197 10*3/uL (ref 150–400)
RBC: 3.72 MIL/uL — ABNORMAL LOW (ref 3.87–5.11)
RDW: 13.5 % (ref 11.5–15.5)
WBC: 2.6 10*3/uL — ABNORMAL LOW (ref 4.0–10.5)
nRBC: 0 % (ref 0.0–0.2)

## 2021-09-28 LAB — LACTATE DEHYDROGENASE: LDH: 184 U/L (ref 98–192)

## 2021-09-28 MED ORDER — HEPARIN SOD (PORK) LOCK FLUSH 100 UNIT/ML IV SOLN
500.0000 [IU] | Freq: Once | INTRAVENOUS | Status: AC
  Administered 2021-09-28: 500 [IU] via INTRAVENOUS
  Filled 2021-09-28: qty 5

## 2021-09-28 MED ORDER — SODIUM CHLORIDE 0.9% FLUSH
10.0000 mL | Freq: Once | INTRAVENOUS | Status: AC
  Administered 2021-09-28: 10 mL via INTRAVENOUS
  Filled 2021-09-28: qty 10

## 2021-09-28 NOTE — Progress Notes (Signed)
**Rose Hill De-Identified via Obfuscation** Rose [facility]  Patient Care Team: Rose Aus, [name] Rose Sickle, MD as Consulting Physician (Hematology and Oncology) Rose Sella, MD as Consulting Physician (Gastroenterology)   Cancer Staging  No matching staging information was found for the patient.   Oncology History Overview Rose Hill  DEC 2017- DLBCL "TRIPLE HIT [myc/ bcl-2/bcl-6 gene rearrangement FISH]" ~10 cm mass RP LN; STAGE II [jan 2018- BMBx-NEG]; Jan 8th R-[facility];   # JAN 29th 2018- DA-R EPOCH x5 ; PET CR; s/p RT [last 03/18/2017]  # FEB-MARCH 2019- RECURRENCE of DLBCL [s/p RP Bx; 4 cm mass inferior to Left Kidney] ; II OPINION [facility]; Dr.Grover.   # April 3rd 2019- R-GDP [carbo]; MAY 2019- PET PR; NOV 2019- PR  #August 2020-recurrence bulky left pelvis/retroperitoneal [9-10 cm]; no biopsy. #August 24th-Rituxan weekly cycle #1; prednisone 60 mg a day.   06/03/2019 - Chemotherapy  BMT OP CAR-T LYMPHODEPLETION FLU/CY + IP AXICABTAGENE CILOLEUCEL (YESCARTA) Fludarabine 30 mg/m2 IV Days 1, 2, 3 Cyclophosphamide 500 mg/m2 IV Days 1, 2, 3 Axicabtagene ciloleucel target dose 2 x 10^6 CAR-positive viable T cells per kg body weight (maximum of 2 x 10^8 CAR-positive viable T cells) on Day 6.  # SEP 2020- 63month PET-[facility]- significant PR.   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     05/07/2019 - 05/07/2019 Chemotherapy   The patient had riTUXimab-hyaluronidase human (RITUXAN HYCELA) 1400-23400 MG -UT/11.7ML injection SQ 1,400 mg, 1,400 mg, Subcutaneous,  Once, 1 of 4 cycles Administration: 1,400 mg (05/07/2019)   for chemotherapy treatment.         INTERVAL HISTORY: Patient is alone.;  Ambulating independently.  Rose Rose Hill 76 y.o.  female pleasant patient above history of recurrent diffuse large B-cell lymphoma is here for follow-up patient currently status post car T-cell therapy in mid October 2020.  In the interim patient had upper endoscopy and colonoscopy-as a PET scan in October 2022 noted to have GI uptake in the colon.  Patient had multiple episodes of infections including-UTI; respiratory infection and also COVID.  S/p multiple rounds of antibiotics.  No hospitalizations.  Patient has lost about 20 pounds.  No current infections.  Patient denies any nausea vomiting no abdominal pain.  No headaches.  No night sweats.  No worsening back pain.    Review of Systems  Constitutional:  Positive for malaise/fatigue. Negative for chills, diaphoresis, fever and weight loss.  HENT:  Negative for nosebleeds and sore throat.   Eyes:  Negative for double vision.  Respiratory:  Positive for shortness of breath. Negative for cough, hemoptysis, sputum production and wheezing.   Cardiovascular:  Negative for chest pain, palpitations, orthopnea and leg swelling.  Gastrointestinal:  Negative for abdominal pain, blood in stool, constipation, diarrhea, melena and vomiting.  Genitourinary:  Negative for dysuria, frequency and urgency.  Musculoskeletal:  Positive for joint pain.  Skin: Negative.  Negative for itching and rash.  Neurological:  Positive for tingling. Negative for focal  weakness and weakness.  Endo/Heme/Allergies:  Does not bruise/bleed easily.  Psychiatric/Behavioral:  Negative for depression. The patient is not nervous/anxious  and does not have insomnia.    PAST MEDICAL HISTORY :  Past Medical History:  Diagnosis Date   Diabetes mellitus without complication (Rose Rose Hill)    Diffuse large B-cell lymphoma (Rose Rose Hill)    Chemo + rad tx's   Dysplastic nevus 03/14/2020   left inf med scapula moderate atypia    Dysplastic nevus 03/14/2020   left lat breast parallel to areaola, moderate atypia    Heart murmur    History of chemotherapy 07/08/2017   History of gastric ulcer    History of radiation therapy 07/08/2017   Hypercholesterolemia    Hypertension    ILD (interstitial lung disease) (Rose Rose Hill)    8 yrs ago   Lymphadenopathy     PAST SURGICAL HISTORY :   Past Surgical History:  Procedure Laterality Date   BREAST BIOPSY Left 2010   neg   CATARACT EXTRACTION W/PHACO Left 10/16/2017   Procedure: CATARACT EXTRACTION PHACO AND INTRAOCULAR LENS PLACEMENT (Rose Rose [facility]) COMPLICATED DIABETIC LEFT;  Surgeon: Leandrew Koyanagi, MD;  Location: [facility];  Service: Ophthalmology;  Laterality: Left;  IRIS HOOKS Diabetic - oral meds   COLONOSCOPY N/A 06/19/2021   Procedure: COLONOSCOPY;  Surgeon: Annamaria Helling, DO;  Location: [facility];  Service: Gastroenterology;  Laterality: N/A;   ESOPHAGOGASTRODUODENOSCOPY N/A 06/19/2021   Procedure: ESOPHAGOGASTRODUODENOSCOPY (EGD);  Surgeon: Annamaria Helling, DO;  Location: [facility];  Service: Gastroenterology;  Laterality: N/A;   HERNIA REPAIR     PARTIAL HYSTERECTOMY     age 54   PERIPHERAL VASCULAR CATHETERIZATION N/A 08/22/2016   Procedure: [name];  Surgeon: [name], MD;  [facility]: [facility];  Service: Cardiovascular;  Laterality: N/A;   TONSILLECTOMY      FAMILY HISTORY :   Family History  Problem Relation Age of Onset   Heart disease Mother    Heart disease Father    Heart disease Brother     SOCIAL HISTORY:   Social History   Tobacco Use   Smoking status: Former    Types: Cigarettes    Quit date: 08/21/1958     Years since quitting: 63.1   Smokeless tobacco: Never  Vaping Use   Vaping Use: Never used  Substance Use Topics   Alcohol use: No   Drug use: No    ALLERGIES:  is allergic to levaquin [levofloxacin in d5w], cefuroxime axetil, and doxycycline.  MEDICATIONS:  Current Outpatient Medications  Medication Sig Dispense Refill   b complex vitamins tablet Take 1 tablet by mouth daily.     gabapentin (NEURONTIN) 300 MG capsule Take 1 capsule (300 mg total) by mouth 2 (two) times daily. (Patient taking differently: Take 300 mg by mouth 3 (three) times daily. 1 in am and 2 at night) 60 capsule 3   hyoscyamine (LEVSIN SL) 0.125 MG SL tablet Place 0.125 mg under the tongue every 4 (four) hours as needed.     latanoprost (XALATAN) 0.005 % ophthalmic solution Place 1 drop into both eyes at bedtime.      Magnesium 400 MG TABS Take 1 tablet by mouth daily.     magnesium oxide (MAG-OX) 400 MG tablet Take 1 tablet by mouth daily.     metoprolol tartrate (LOPRESSOR) 25 MG tablet TAKE 1 TABLET BY MOUTH TWICE DAILY (Patient taking differently: 12.5 mg 2 (two) times daily. (BETA BLOCKER)) 180 tablet 0   montelukast (  SINGULAIR) 10 MG tablet Take 1 tablet (10 mg total) by mouth at bedtime. One a day. 30 tablet 11   ondansetron (ZOFRAN) 8 MG tablet Take by mouth.     pantoprazole (PROTONIX) 40 MG tablet Take 1 tablet by mouth in the morning and at bedtime.     potassium chloride (KLOR-CON) 10 MEQ tablet Take by mouth.     pravastatin (PRAVACHOL) 40 MG tablet Take 40 mg by mouth daily.     prochlorperazine (COMPAZINE) 10 MG tablet Take 10 mg by mouth every 6 (six) hours as needed for nausea or vomiting.     triamterene-hydrochlorothiazide (DYAZIDE) 37.5-25 MG capsule Take 1 capsule by mouth daily.     valACYclovir (VALTREX) 500 MG tablet Take 1 tablet by mouth every morning.     No current facility-administered medications for this visit.   Facility-Administered Medications Ordered in Other Visits  Medication  Dose Route Frequency Provider Last Rate Last Admin   0.9 %  sodium chloride infusion   Intravenous Continuous Rose Sickle, MD 10 mL/hr at 06/19/21 1230 Continued from Pre-op at 06/19/21 1230   0.9 %  sodium chloride infusion   Intravenous Continuous Rose Sickle, MD   Stopped at 12/26/16 1011   0.9 %  sodium chloride infusion   Intravenous Continuous Charlaine Dalton R, MD 10 mL/hr at 12/27/16 0930 New Bag at 12/27/16 0930   heparin lock flush 100 unit/mL  500 Units Intravenous Once Charlaine Dalton R, MD       heparin lock flush 100 unit/mL  500 Units Intravenous Once Charlaine Dalton R, MD       sodium chloride flush (NS) 0.9 % injection 10 mL  10 mL Intravenous PRN Charlaine Dalton R, MD   10 mL at 11/06/16 1000   sodium chloride flush (NS) 0.9 % injection 10 mL  10 mL Intravenous PRN Rose Sickle, MD   10 mL at 11/07/16 0905   sodium chloride flush (NS) 0.9 % injection 10 mL  10 mL Intravenous PRN Rose Sickle, MD       sodium chloride flush (NS) 0.9 % injection 10 mL  10 mL Intravenous PRN Charlaine Dalton R, MD   10 mL at 12/05/16 1030   sodium chloride flush (NS) 0.9 % injection 10 mL  10 mL Intravenous PRN Charlaine Dalton R, MD   10 mL at 12/27/16 0930   sodium chloride flush (NS) 0.9 % injection 10 mL  10 mL Intravenous PRN Rose Sickle, MD   10 mL at 09/27/20 1305    PHYSICAL EXAMINATION: ECOG PERFORMANCE STATUS: 1 - Symptomatic but completely ambulatory  BP (!) 107/54 (BP Location: Left Arm, Patient Position: Sitting, Cuff Size: Normal)    Pulse 76    Temp 99.6 F (37.6 C) (Tympanic)    Ht 5\' 7"  (1.702 m)    Wt 129 lb 3.2 oz (58.6 kg)    SpO2 98%    BMI 20.24 kg/m   Filed Weights   09/28/21 1045  Weight: 129 lb 3.2 oz (58.6 kg)    Physical Exam Constitutional:      Comments: She is alone.  Walking independently.  HENT:     Head: Normocephalic and atraumatic.     Mouth/Throat:     Pharynx: No oropharyngeal  exudate.  Eyes:     Pupils: Pupils are equal, round, and reactive to light.  Cardiovascular:     Rate and Rhythm: Normal rate and regular rhythm.  Pulmonary:  Effort: No respiratory distress.     Breath sounds: No wheezing.     Comments: Bilateral crackles at the bases.  No wheeze. Abdominal:     General: Bowel sounds are normal. There is no distension.     Palpations: Abdomen is soft. There is no mass.     Tenderness: There is no abdominal tenderness. There is no guarding or rebound.  Musculoskeletal:        General: No tenderness. Normal range of motion.     Cervical back: Normal range of motion and neck supple.  Skin:    General: Skin is warm.  Neurological:     Mental Status: She is alert and oriented to person, place, and time.  Psychiatric:        Mood and Affect: Affect normal.   LABORATORY DATA:  I have reviewed the data as listed    Component Value Date/Time   NA 142 09/28/2021 1027   K 3.8 09/28/2021 1027   K 4.1 07/01/2014 1429   CL 105 09/28/2021 1027   CO2 27 09/28/2021 1027   GLUCOSE 114 (H) 09/28/2021 1027   BUN 13 09/28/2021 1027   CREATININE 0.68 09/28/2021 1027   CALCIUM 8.8 (L) 09/28/2021 1027   PROT 6.5 12/27/2020 1428   ALBUMIN 4.1 12/27/2020 1428   AST 18 12/27/2020 1428   ALT 11 12/27/2020 1428   ALKPHOS 54 12/27/2020 1428   BILITOT 0.4 12/27/2020 1428   GFRNONAA >60 09/28/2021 1027   GFRAA >60 03/28/2020 1305    No results found for: SPEP, UPEP  Hill Results  Component Value Date   WBC 2.6 (L) 09/28/2021   NEUTROABS 1.3 (L) 09/28/2021   HGB 12.0 09/28/2021   HCT 35.5 (L) 09/28/2021   MCV 95.4 09/28/2021   PLT 197 09/28/2021      Chemistry      Component Value Date/Time   NA 142 09/28/2021 1027   K 3.8 09/28/2021 1027   K 4.1 07/01/2014 1429   CL 105 09/28/2021 1027   CO2 27 09/28/2021 1027   BUN 13 09/28/2021 1027   CREATININE 0.68 09/28/2021 1027      Component Value Date/Time   CALCIUM 8.8 (L) 09/28/2021 1027   ALKPHOS  54 12/27/2020 1428   AST 18 12/27/2020 1428   ALT 11 12/27/2020 1428   BILITOT 0.4 12/27/2020 1428       RADIOGRAPHIC STUDIES: I have personally reviewed the radiological images as listed and agreed with the findings in the report. No results found.   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 No clinical evidence of progression at this time.OCT 2022- PET [[facility]]-Stable mildly hypermetabolic soft tissue nodule lateral to the left psoas muscle (CT 156). Resolution of the uptake in the left quadratus lumborum muscle and in the subcutaneous tissue overlying the left gluteal muscle.   #Secondary immunodeficiency -multiple infections [UTI/resipiratory; COVID]-lost 20 pounds-October 2022 [facility] IgG-156.  Recommend immunoglobulin infusions every month [trough goal: > 500].  Discussed the potential reactions to IVIG including infusion reactions; hemolytic reactions headaches etc.  Plan start next week.  #Colonic uptake on PET scan-colonoscopy October 2022-negative; unrelated polyp resected.s/p EGD  # Prophylactic antibiotics with Valtrex and Bactrim.  Please check CD4/CD8 count.  # Peripheral neuropathy/left foot drop-STABLE>   # ILD- STABLE.   # Mediport-IV access-stable; port flush.  # DISPOSITION: #  Start IVIG  infusion in next week. # follow up in 5 weeks--MD; labs- cbc/cmp/LDH;quantitative immunoglobilun/ CD 4/c8 count- port- IVIG infusion- Dr.B   Orders Placed This Encounter  Procedures   T-helper cells CD4/CD8 %    Standing Status:   Future    Standing Expiration Date:   09/28/2022   Immunoglobulins, QN, A/E/G/M    Standing Status:   Future    Standing Expiration Date:   09/28/2022   CBC with Differential/Platelet    Standing Status:   Future    Standing Expiration Date:   09/28/2022   Comprehensive metabolic panel    Standing Status:    Future    Standing Expiration Date:   09/28/2022   Lactate dehydrogenase    Standing Status:   Future    Standing Expiration Date:   09/28/2022   All questions were answered. The patient knows to call the clinic with any problems, questions or concerns.      Rose Sickle, MD 09/28/2021 12:01 PM

## 2021-09-28 NOTE — Progress Notes (Signed)
Pt states she had covid and a lung infection since last visit and has lost a lot of weight due to this.

## 2021-09-28 NOTE — Assessment & Plan Note (Addendum)
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 No clinical evidence of progression at this time.OCT 2022- PET [[facility]]-Stable mildly hypermetabolic soft tissue nodule lateral to the left psoas muscle (CT 156). Resolution of the uptake in the left quadratus lumborum muscle and in the subcutaneous tissue overlying the left gluteal muscle.   #Secondary immunodeficiency -multiple infections [UTI/resipiratory; COVID]-lost 20 pounds-October 2022 [facility] IgG-156.  Recommend immunoglobulin infusions every month [trough goal: > 500].  Discussed the potential reactions to IVIG including infusion reactions; hemolytic reactions headaches etc.  Plan start next week.  #Colonic uptake on PET scan-colonoscopy October 2022-negative; unrelated polyp resected.s/p EGD  # Prophylactic antibiotics with Valtrex and Bactrim.  Please check CD4/CD8 count.  # Peripheral neuropathy/left foot drop-STABLE>   # ILD- STABLE.   # Mediport-IV access-stable; port flush.  # DISPOSITION: #  Start IVIG infusion in next week. # follow up in 5 weeks--MD; labs- cbc/cmp/LDH;quantitative immunoglobilun/ CD 4/c8 count- port- IVIG infusion- Dr.B

## 2021-10-03 ENCOUNTER — Inpatient Hospital Stay: Payer: Medicare PPO

## 2021-10-05 ENCOUNTER — Other Ambulatory Visit: Payer: Self-pay

## 2021-10-05 ENCOUNTER — Inpatient Hospital Stay: Payer: Medicare PPO

## 2021-10-05 VITALS — BP 103/54 | HR 76 | Temp 98.6°F | Resp 18 | Wt 129.3 lb

## 2021-10-05 DIAGNOSIS — C8333 Diffuse large B-cell lymphoma, intra-abdominal lymph nodes: Secondary | ICD-10-CM

## 2021-10-05 DIAGNOSIS — E860 Dehydration: Secondary | ICD-10-CM

## 2021-10-05 DIAGNOSIS — D849 Immunodeficiency, unspecified: Secondary | ICD-10-CM | POA: Diagnosis not present

## 2021-10-05 MED ORDER — ACETAMINOPHEN 325 MG PO TABS
650.0000 mg | ORAL_TABLET | Freq: Once | ORAL | Status: AC
  Administered 2021-10-05: 650 mg via ORAL
  Filled 2021-10-05: qty 2

## 2021-10-05 MED ORDER — MONTELUKAST SODIUM 10 MG PO TABS
10.0000 mg | ORAL_TABLET | Freq: Every day | ORAL | Status: DC
  Administered 2021-10-05: 10 mg via ORAL

## 2021-10-05 MED ORDER — IMMUNE GLOBULIN (HUMAN) 5 GM/50ML IV SOLN
25.0000 g | Freq: Once | INTRAVENOUS | Status: AC
  Administered 2021-10-05: 25 g via INTRAVENOUS
  Filled 2021-10-05: qty 200

## 2021-10-05 MED ORDER — MEPERIDINE HCL 25 MG/ML IJ SOLN
25.0000 mg | Freq: Once | INTRAMUSCULAR | Status: AC
  Administered 2021-10-05: 25 mg via INTRAVENOUS
  Filled 2021-10-05: qty 1

## 2021-10-05 MED ORDER — LORATADINE 10 MG PO TABS: 10.0000 mg | ORAL_TABLET | Freq: Every day | ORAL | Status: DC

## 2021-10-05 MED ORDER — DEXTROSE 5 % IV SOLN
Freq: Once | INTRAVENOUS | Status: AC
  Filled 2021-10-05: qty 250

## 2021-10-05 MED ORDER — HEPARIN SOD (PORK) LOCK FLUSH 100 UNIT/ML IV SOLN
500.0000 [IU] | Freq: Once | INTRAVENOUS | Status: AC
  Administered 2021-10-05: 500 [IU] via INTRAVENOUS
  Filled 2021-10-05: qty 5

## 2021-10-05 MED ORDER — SODIUM CHLORIDE 0.9 % IV SOLN
Freq: Once | INTRAVENOUS | Status: AC
  Filled 2021-10-05: qty 250

## 2021-10-05 MED ORDER — DIPHENHYDRAMINE HCL 25 MG PO CAPS
25.0000 mg | ORAL_CAPSULE | Freq: Once | ORAL | Status: AC
  Administered 2021-10-05: 25 mg via ORAL
  Filled 2021-10-05: qty 1

## 2021-10-05 NOTE — Patient Instructions (Signed)
[facility] CANCER CTR AT [place]  Discharge Instructions: Thank you for choosing [name] to provide your oncology and hematology care.  If you have a lab appointment with the [facility], please go directly to the [facility] and check in at the registration area.  Wear comfortable clothing and clothing appropriate for easy access to any Portacath or PICC line.   We strive to give you quality time with your provider. You may need to reschedule your appointment if you arrive late (15 or more minutes).  Arriving late affects you and other patients whose appointments are after yours.  Also, if you miss three or more appointments without notifying the office, you may be dismissed from the clinic at the providers discretion.      For prescription refill requests, have your pharmacy contact our office and allow 72 hours for refills to be completed.    Today you received the following chemotherapy and/or immunotherapy agents Immunoglobin    To help prevent nausea and vomiting after your treatment, we encourage you to take your nausea medication as directed.  BELOW ARE SYMPTOMS THAT SHOULD BE REPORTED IMMEDIATELY: *FEVER GREATER THAN 100.4 F (38 C) OR HIGHER *CHILLS OR SWEATING *NAUSEA AND VOMITING THAT IS NOT CONTROLLED WITH YOUR NAUSEA MEDICATION *UNUSUAL SHORTNESS OF BREATH *UNUSUAL BRUISING OR BLEEDING *URINARY PROBLEMS (pain or burning when urinating, or frequent urination) *BOWEL PROBLEMS (unusual diarrhea, constipation, pain near the anus) TENDERNESS IN MOUTH AND THROAT WITH OR WITHOUT PRESENCE OF ULCERS (sore throat, sores in mouth, or a toothache) UNUSUAL RASH, SWELLING OR PAIN  UNUSUAL VAGINAL DISCHARGE OR ITCHING   Items with * indicate a potential emergency and should be followed up as soon as possible or go to the Emergency Department if any problems should occur.  Please show the CHEMOTHERAPY ALERT CARD or IMMUNOTHERAPY ALERT CARD at check-in to  the Emergency Department and triage nurse.  Should you have questions after your visit or need to cancel or reschedule your appointment, please contact [facility] CANCER [place] AT [place]  [phone] and follow the prompts.  Office hours are 8:00 a.m. to 4:30 p.m. Monday - Friday. Please note that voicemails left after 4:00 p.m. may not be returned until the following business day.  We are closed weekends and major holidays. You have access to a nurse at all times for urgent questions. Please call the main number to the clinic [phone] and follow the prompts.  For any non-urgent questions, you may also contact your provider using MyChart. We now offer e-Visits for anyone 74 and older to request care online for non-urgent symptoms. For details visit [email or web address].   Also download the MyChart app! Go to the app store, search "MyChart", open the app, select [facility], and log in with your MyChart username and password.  Due to Covid, a mask is required upon entering the hospital/clinic. If you do not have a mask, one will be given to you upon arrival. For doctor visits, patients may have 1 support person aged 80 or older with them. For treatment visits, patients cannot have anyone with them due to current Covid guidelines and our immunocompromised population.

## 2021-10-05 NOTE — Progress Notes (Signed)
Per Dr Rogue Bussing to increase IVIG rate to 480 mg/kg/hr due to diagnosis. RN increased rate per protocol. At 1130 while pt was on max rate, pt expressed to RN that she "felt very cold and is starting to shake." Rigors are present. Vitals stable. IVIG stopped immediately and fluids started.  [name], NP notified and arrived at chairside to assess pt at 59. Per [name], NP to administer Demerol 25 mg IV. ** See flowsheets for vitals** ** See MAR for medication administration**  Per [name], NP to finish NS fluids and wait for rigors to subside completely prior to starting pt back at 240 mg/kg/hr. Per [name], NP to stay at the rate 240 mg/kg/hr for the rest of IVIG infusion. At 1210, pt states that rigors are completely gone and that she feels back to normal. Vitals stable. IVIG restarted at 1217 at the rate of 240 mg/kg/hr.  IVIG stopped at 1300. Pt tolerated the rest of the IVIG infusion well with no signs of complications. Pt observed 30 minutes post infusion per protocol. RN educated pt on the importance of notifying the clinic if any complications occur at home and when to seek emergency care. Pt verbalized understanding and all questions answered at this time. Vitals stable. Pt stable for discharge. Pt discharged via wheelchair to be driven home by husband.   [place] [employer]

## 2021-10-19 ENCOUNTER — Ambulatory Visit: Payer: Medicare PPO

## 2021-10-19 NOTE — Progress Notes (Signed)
Nutrition Assessment ? ? ?Reason for Assessment:  ? ?Patient identified on Malnutrition Screening report for weight loss. ? ? ?ASSESSMENT:  ?76 year old female with b-cell lymphoma.  Past medical history of DM, HTN, HLD.   ?Patient receiving IVIG.  ? ?Spoke with patient via phone and introduce service.  Patient reports that weight loss likely coming from multiple UTI infections, COVID, bacterial lung infection from COVID over the last several months.  Reports that her appetite has been improving over the last 4 days and she has been eating more.  Says that she thinks she has gained about 4 pounds.  Has been trying to eat 3 meals per day. Drinks a shake of almond milk, protein powder, and fruit that husband prepares.  Does not really like ensure/boost type shakes.   ? ?Reports occasional diarrhea. Reports that nuts cause diarrhea and some diary products.  ? ? ?Medications: b complex vit, Mag, zofran, protonix, KCL ? ? ?Labs: reviewed ? ? ?Anthropometrics:  ? ?Height: 67 inches ?Weight: 129 lb 4.8 oz  ?25 lb weight loss in the last year ?BMI: 20 ? ?16% weight loss in the last year, concerning ? ? ? ?NUTRITION DIAGNOSIS: Unintentional weight loss related to multiple infections, COVID as evidenced by 16% weight loss in the last year and decreased intake ? ? ?INTERVENTION:  ?Encouraged patient to try lactaid milk (2% or whole milk) for more calories and protein than almond milk in shake.  Will mail recipes of shakes for patient to try. ?Discussed ways to add calories and protein in diet.  Cautioned patient that adding fat can increase diarrhea. Encouraged patient to monitor foods she is eating and diarrhea ?Will mail High Calorie, High Protein Diet to patient.  ?Contact information mailed ? ? ?MONITORING, EVALUATION, GOAL: weight trends, intake ? ? ?Next Visit: at upcoming April visit ? ?Allani Reber B. [name], RD, LDN ?Registered Dietitian ?[phone] (mobile) ? ? ? ? ? ?

## 2021-11-07 ENCOUNTER — Inpatient Hospital Stay: Payer: Medicare PPO

## 2021-11-07 ENCOUNTER — Other Ambulatory Visit: Payer: Self-pay

## 2021-11-07 ENCOUNTER — Inpatient Hospital Stay: Payer: Medicare PPO | Attending: Internal Medicine | Admitting: Nurse Practitioner

## 2021-11-07 ENCOUNTER — Encounter: Payer: Self-pay | Admitting: Nurse Practitioner

## 2021-11-07 VITALS — BP 123/52 | HR 74 | Temp 98.8°F | Resp 20

## 2021-11-07 VITALS — BP 131/52 | HR 76 | Temp 98.6°F | Ht 67.0 in | Wt 134.0 lb

## 2021-11-07 DIAGNOSIS — C8333 Diffuse large B-cell lymphoma, intra-abdominal lymph nodes: Secondary | ICD-10-CM | POA: Diagnosis not present

## 2021-11-07 DIAGNOSIS — D84821 Immunodeficiency due to drugs: Secondary | ICD-10-CM

## 2021-11-07 DIAGNOSIS — G629 Polyneuropathy, unspecified: Secondary | ICD-10-CM | POA: Insufficient documentation

## 2021-11-07 DIAGNOSIS — Z8572 Personal history of non-Hodgkin lymphomas: Secondary | ICD-10-CM | POA: Insufficient documentation

## 2021-11-07 DIAGNOSIS — R197 Diarrhea, unspecified: Secondary | ICD-10-CM | POA: Insufficient documentation

## 2021-11-07 DIAGNOSIS — I10 Essential (primary) hypertension: Secondary | ICD-10-CM | POA: Insufficient documentation

## 2021-11-07 DIAGNOSIS — J849 Interstitial pulmonary disease, unspecified: Secondary | ICD-10-CM | POA: Insufficient documentation

## 2021-11-07 DIAGNOSIS — M21372 Foot drop, left foot: Secondary | ICD-10-CM | POA: Diagnosis not present

## 2021-11-07 DIAGNOSIS — T451X5A Adverse effect of antineoplastic and immunosuppressive drugs, initial encounter: Secondary | ICD-10-CM

## 2021-11-07 DIAGNOSIS — E860 Dehydration: Secondary | ICD-10-CM

## 2021-11-07 DIAGNOSIS — D849 Immunodeficiency, unspecified: Secondary | ICD-10-CM | POA: Diagnosis present

## 2021-11-07 DIAGNOSIS — E876 Hypokalemia: Secondary | ICD-10-CM | POA: Diagnosis not present

## 2021-11-07 DIAGNOSIS — Z79899 Other long term (current) drug therapy: Secondary | ICD-10-CM

## 2021-11-07 LAB — CBC WITH DIFFERENTIAL/PLATELET
Abs Immature Granulocytes: 0.14 10*3/uL — ABNORMAL HIGH (ref 0.00–0.07)
Basophils Absolute: 0 10*3/uL (ref 0.0–0.1)
Basophils Relative: 1 %
Eosinophils Absolute: 0.3 10*3/uL (ref 0.0–0.5)
Eosinophils Relative: 10 %
HCT: 35.4 % — ABNORMAL LOW (ref 36.0–46.0)
Hemoglobin: 11.8 g/dL — ABNORMAL LOW (ref 12.0–15.0)
Immature Granulocytes: 5 %
Lymphocytes Relative: 12 %
Lymphs Abs: 0.3 10*3/uL — ABNORMAL LOW (ref 0.7–4.0)
MCH: 33.1 pg (ref 26.0–34.0)
MCHC: 33.3 g/dL (ref 30.0–36.0)
MCV: 99.2 fL (ref 80.0–100.0)
Monocytes Absolute: 0.4 10*3/uL (ref 0.1–1.0)
Monocytes Relative: 15 %
Neutro Abs: 1.6 10*3/uL — ABNORMAL LOW (ref 1.7–7.7)
Neutrophils Relative %: 57 %
Platelets: 175 10*3/uL (ref 150–400)
RBC: 3.57 MIL/uL — ABNORMAL LOW (ref 3.87–5.11)
RDW: 14.6 % (ref 11.5–15.5)
WBC: 2.8 10*3/uL — ABNORMAL LOW (ref 4.0–10.5)
nRBC: 0 % (ref 0.0–0.2)

## 2021-11-07 LAB — COMPREHENSIVE METABOLIC PANEL
ALT: 15 U/L (ref 0–44)
AST: 25 U/L (ref 15–41)
Albumin: 3.8 g/dL (ref 3.5–5.0)
Alkaline Phosphatase: 55 U/L (ref 38–126)
Anion gap: 9 (ref 5–15)
BUN: 10 mg/dL (ref 8–23)
CO2: 25 mmol/L (ref 22–32)
Calcium: 8.9 mg/dL (ref 8.9–10.3)
Chloride: 104 mmol/L (ref 98–111)
Creatinine, Ser: 0.64 mg/dL (ref 0.44–1.00)
GFR, Estimated: >60 mL/min (ref >60)
Glucose, Bld: 170 mg/dL — ABNORMAL HIGH (ref 70–99)
Potassium: 3.4 mmol/L — ABNORMAL LOW (ref 3.5–5.1)
Sodium: 138 mmol/L (ref 135–145)
Total Bilirubin: 0.3 mg/dL (ref 0.3–1.2)
Total Protein: 6.3 g/dL — ABNORMAL LOW (ref 6.5–8.1)

## 2021-11-07 LAB — LACTATE DEHYDROGENASE: LDH: 191 U/L (ref 98–192)

## 2021-11-07 MED ORDER — MONTELUKAST SODIUM 10 MG PO TABS
10.0000 mg | ORAL_TABLET | Freq: Every day | ORAL | Status: DC
  Administered 2021-11-07: 10 mg via ORAL

## 2021-11-07 MED ORDER — DEXTROSE 5 % IV SOLN
Freq: Once | INTRAVENOUS | Status: AC
  Filled 2021-11-07: qty 250

## 2021-11-07 MED ORDER — ACETAMINOPHEN 325 MG PO TABS
650.0000 mg | ORAL_TABLET | Freq: Once | ORAL | Status: AC
  Administered 2021-11-07: 650 mg via ORAL
  Filled 2021-11-07: qty 2

## 2021-11-07 MED ORDER — POTASSIUM CHLORIDE CRYS ER 10 MEQ PO TBCR: 10.0000 meq | EXTENDED_RELEASE_TABLET | Freq: Every day | ORAL | 0 refills | Status: DC

## 2021-11-07 MED ORDER — DIPHENHYDRAMINE HCL 25 MG PO CAPS
25.0000 mg | ORAL_CAPSULE | Freq: Once | ORAL | Status: AC
  Administered 2021-11-07: 25 mg via ORAL
  Filled 2021-11-07: qty 1

## 2021-11-07 MED ORDER — IMMUNE GLOBULIN (HUMAN) 5 GM/50ML IV SOLN
25.0000 g | Freq: Once | INTRAVENOUS | Status: AC
  Administered 2021-11-07: 25 g via INTRAVENOUS
  Filled 2021-11-07: qty 200

## 2021-11-07 MED ORDER — HEPARIN SOD (PORK) LOCK FLUSH 100 UNIT/ML IV SOLN
500.0000 [IU] | Freq: Once | INTRAVENOUS | Status: AC | PRN
  Administered 2021-11-07: 500 [IU]
  Filled 2021-11-07: qty 5

## 2021-11-07 NOTE — Progress Notes (Signed)
[facility] ?OFFICE PROGRESS NOTE ? ?Patient Care Team: ?Rusty Aus, [name] ?Cammie Sickle, MD as Consulting Physician (Hematology and Oncology) ?Alice Reichert, Benay Pike, MD as Consulting Physician (Gastroenterology) ? ? Cancer Staging  ?No matching staging information was found for the patient. ? ? ?Oncology History Overview Note  ?DEC 2017- DLBCL "TRIPLE HIT [myc/ bcl-2/bcl-6 gene rearrangement FISH]" ~10 cm mass RP LN; STAGE II [jan 2018- BMBx-NEG]; Jan 8th R-[facility];  ? ?# JAN 29th 2018- DA-R EPOCH x5 ; PET CR; s/p RT [last 03/18/2017] ? ?# FEB-MARCH 2019- RECURRENCE of DLBCL [s/p RP Bx; 4 cm mass inferior to Left Kidney] ; II OPINION [facility]; Dr.Grover.  ? ?# April 3rd 2019- R-GDP [carbo]; MAY 2019- PET PR; NOV 2019- PR ? ?#August 2020-recurrence bulky left pelvis/retroperitoneal [9-10 cm]; no biopsy. ?#August 24th-Rituxan weekly cycle #1; prednisone 60 mg a day.  ? ?06/03/2019 - Chemotherapy  ?BMT OP CAR-T LYMPHODEPLETION FLU/CY + IP AXICABTAGENE CILOLEUCEL (YESCARTA) ?Fludarabine 30 mg/m2 IV Days 1, 2, 3 ?Cyclophosphamide 500 mg/m2 IV Days 1, 2, 3 ?Axicabtagene ciloleucel target dose 2 x 10^6 CAR-positive viable T cells per kg body weight (maximum of 2 x 10^8 CAR-positive viable T cells) on Day 6. ? ?# SEP 2020- 63monthPET-[facility]- significant PR.  ? ?#August 2020-2D echo ejection fraction 60 to 65% ? ?-------------------------------- ? ?# JAN 26th-LP ? ?# Interstitial Lung disease [surveillance] ?--------------------------------------------------------------------------  ?DIAGNOSIS: [[place]2019 ] RECURRENT DLBCL ? ?STAGE:   Recurrent ; GOALS: CURATIVE ? ?CURRENT/MOST RECENT THERAPY: surveillaince ?  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 The patient had riTUXimab-pvvr (RUXIENCE) 700 mg in sodium chloride 0.9 % 250 mL chemo infusion, , Intravenous, Once, 1 of 4 cycles ?Administration:  (04/13/2019) ?  for chemotherapy treatment.  ?  ?05/07/2019 - 05/07/2019 Chemotherapy  ? The patient had riTUXimab-hyaluronidase human (RITUXAN HYCELA) 1400-23400 MG -UT/11.7ML injection SQ 1,400 mg, 1,400 mg, Subcutaneous,  Once, 1 of 4 cycles ?Administration: 1,400 mg (05/07/2019) ? for chemotherapy treatment.  ?  ? ?  ? ?INTERVAL HISTORY: Patient is alone. Ambulating independently. ? ?Rose [place]89y.o.  female pleasant patient above history of recurrent diffuse large B-cell lymphoma is here for follow-up patient currently status post car T-cell therapy in mid October 2020. ? ?In the interim patient had upper endoscopy and colonoscopy-as a PET scan in October 2022 noted to have GI uptake in the colon. ? ?Patient had multiple episodes of infections including-UTI; respiratory infection and also COVID.  S/p multiple rounds of antibiotics.  No hospitalizations.  Patient had lost about 20 pounds. ? ?No current infections. Denies nausea, vomiting, abdominal pain. Has chronic diarrhea which is unchanged. No headaches, night sweats, or back pain.  ? ?Review of Systems  ?Constitutional:  Positive for malaise/fatigue. Negative for chills, diaphoresis, fever and weight loss.  ?HENT:  Negative for nosebleeds and sore throat.   ?Eyes:  Negative for double vision.  ?Respiratory:  Negative for cough, hemoptysis, sputum production, shortness of breath and wheezing.   ?Cardiovascular:  Negative for chest pain, palpitations, orthopnea and leg swelling.  ?Gastrointestinal:  Negative for abdominal pain, blood in stool, constipation, diarrhea, melena and vomiting.  ?Genitourinary:  Negative for dysuria, frequency and urgency.  ?Musculoskeletal:  Negative for joint pain.  ?Skin: Negative.  Negative for itching and rash.  ?Neurological:  Negative for tingling, sensory change, focal weakness, weakness and headaches.  ?Endo/Heme/Allergies:  Does not bruise/bleed easily.  ?  Psychiatric/Behavioral:  Negative for depression. The patient is not  nervous/anxious and does not have insomnia.   ? ? PAST MEDICAL HISTORY :  ?Past Medical History:  ?Diagnosis Date  ? Diabetes mellitus without complication ([place])   ? Diffuse large B-cell lymphoma ([place])   ? Chemo + rad tx's  ? 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 left inf med scapula moderate atypia   ? 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 left lat breast parallel to areaola, moderate atypia   ? Heart murmur   ? History of chemotherapy 07/08/2017  ? History of gastric ulcer   ? History of radiation therapy 07/08/2017  ? Hypercholesterolemia   ? Hypertension   ? ILD (interstitial lung disease) ([place])   ? 8 yrs ago  ? Lymphadenopathy   ? ? ?PAST SURGICAL HISTORY :   ?Past Surgical History:  ?Procedure Laterality Date  ? BREAST BIOPSY Left 2010  ? neg  ? CATARACT EXTRACTION W/PHACO Left 10/16/2017  ? Procedure: CATARACT EXTRACTION PHACO AND INTRAOCULAR LENS PLACEMENT ([place]) COMPLICATED DIABETIC LEFT;  Surgeon: Leandrew Koyanagi, MD;  Location: [facility];  Service: Ophthalmology;  Laterality: Left;  IRIS HOOKS ?Diabetic - oral meds  ? COLONOSCOPY N/A 06/19/2021  ? Procedure: COLONOSCOPY;  Surgeon: Annamaria Helling, DO;  Location[facility] ENDOSCOPY;  Service: Gastroenterology;  Laterality: N/A;  ? ESOPHAGOGASTRODUODENOSCOPY N/A 06/19/2021  ? Procedure: ESOPHAGOGASTRODUODENOSCOPY (EGD);  Surgeon: Annamaria Helling, DO;  Location[facility] ENDOSCOPY;  Service: Gastroenterology;  Laterality: N/A;  ? HERNIA REPAIR    ? PARTIAL HYSTERECTOMY    ? age 39  ? PERIPHERAL VASCULAR CATHETERIZATION N/A 08/22/2016  ? Procedure: Porta Cath Insertion;  Surgeon: [name], MD;  Location: [facility];  Service: Cardiovascular;  Laterality: N/A;  ? TONSILLECTOMY    ? ? ?FAMILY HISTORY :   ?Family History  ?Problem Relation Age of Onset  ? Heart disease Mother   ? Heart disease Father   ? Heart disease Brother   ? ? ?SOCIAL HISTORY:   ?Social History  ? ?Tobacco Use  ? Smoking status: Former  ?  Types: Cigarettes  ?  Quit  date: 08/21/1958  ?  Years since quitting: 63.2  ? Smokeless tobacco: Never  ?Vaping Use  ? Vaping Use: Never used  ?Substance Use Topics  ? Alcohol use: No  ? Drug use: No  ? ? ?ALLERGIES:  is allergic to levaquin [levofloxacin in d5w], cefuroxime axetil, and doxycycline. ? ?MEDICATIONS:  ?Current Outpatient Medications  ?Medication Sig Dispense Refill  ? b complex vitamins tablet Take 1 tablet by mouth daily.    ? gabapentin (NEURONTIN) 300 MG capsule Take 1 capsule (300 mg total) by mouth 2 (two) times daily. (Patient taking differently: Take 300 mg by mouth 3 (three) times daily. 1 in am and 2 at night) 60 capsule 3  ? hyoscyamine (LEVSIN SL) 0.125 MG SL tablet Place 0.125 mg under the tongue every 4 (four) hours as needed.    ? latanoprost (XALATAN) 0.005 % ophthalmic solution Place 1 drop into both eyes at bedtime.     ? Magnesium 400 MG TABS Take 1 tablet by mouth daily.    ? magnesium oxide (MAG-OX) 400 MG tablet Take 1 tablet by mouth daily.    ? metoprolol tartrate (LOPRESSOR) 25 MG tablet TAKE 1 TABLET BY MOUTH TWICE DAILY (Patient taking differently: 12.5 mg 2 (two) times daily. (BETA BLOCKER)) 180 tablet 0  ? montelukast (SINGULAIR) 10 MG tablet Take 1 tablet (10 mg total)  by mouth at bedtime. One a day. 30 tablet 11  ? ondansetron (ZOFRAN) 8 MG tablet Take by mouth.    ? pantoprazole (PROTONIX) 40 MG tablet Take 1 tablet by mouth in the morning and at bedtime.    ? potassium chloride (KLOR-CON) 10 MEQ tablet Take by mouth.    ? pravastatin (PRAVACHOL) 40 MG tablet Take 40 mg by mouth daily.    ? prochlorperazine (COMPAZINE) 10 MG tablet Take 10 mg by mouth every 6 (six) hours as needed for nausea or vomiting.    ? triamterene-hydrochlorothiazide (DYAZIDE) 37.5-25 MG capsule Take 1 capsule by mouth daily.    ? valACYclovir (VALTREX) 500 MG tablet Take 1 tablet by mouth every morning.    ? ?No current facility-administered medications for this visit.  ? ?Facility-Administered Medications Ordered in Other  Visits  ?Medication Dose Route Frequency Provider Last Rate Last Admin  ? 0.9 %  sodium chloride infusion   Intravenous Continuous Cammie Sickle, MD 10 mL/hr at 06/19/21 1230 Continued from Pre-op at 06/19/21 123

## 2021-11-07 NOTE — Progress Notes (Signed)
C/o with the diarrhea, she will not eat when having this. Could this be hereditary? ? ?Feels like she may be dehydrated. ?

## 2021-11-07 NOTE — Patient Instructions (Signed)

## 2021-11-08 LAB — T-HELPER CELLS CD4/CD8 %
% CD 4 Pos. Lymph.: 28.1 % — ABNORMAL LOW (ref 30.8–58.5)
Absolute CD 4 Helper: 112 /uL — ABNORMAL LOW (ref 359–1519)
Basophils Absolute: 0 10*3/uL (ref 0.0–0.2)
Basos: 1 %
CD3+CD4+ Cells/CD3+CD8+ Cells Bld: 0.77 — ABNORMAL LOW (ref 0.92–3.72)
CD3+CD8+ Cells # Bld: 146 /uL (ref 109–897)
CD3+CD8+ Cells NFr Bld: 36.6 % — ABNORMAL HIGH (ref 12.0–35.5)
EOS (ABSOLUTE): 0.3 10*3/uL (ref 0.0–0.4)
Eos: 9 %
Hematocrit: 35.3 % (ref 34.0–46.6)
Hemoglobin: 11.6 g/dL (ref 11.1–15.9)
Immature Grans (Abs): 0.1 10*3/uL (ref 0.0–0.1)
Immature Granulocytes: 3 %
Lymphocytes Absolute: 0.4 10*3/uL — ABNORMAL LOW (ref 0.7–3.1)
Lymphs: 13 %
MCH: 32.3 pg (ref 26.6–33.0)
MCHC: 32.9 g/dL (ref 31.5–35.7)
MCV: 98 fL — ABNORMAL HIGH (ref 79–97)
Monocytes Absolute: 0.4 10*3/uL (ref 0.1–0.9)
Monocytes: 16 %
Neutrophils Absolute: 1.7 10*3/uL (ref 1.4–7.0)
Neutrophils: 58 %
Platelets: 175 10*3/uL (ref 150–450)
RBC: 3.59 x10E6/uL — ABNORMAL LOW (ref 3.77–5.28)
RDW: 14.9 % (ref 11.7–15.4)
WBC: 2.8 10*3/uL — ABNORMAL LOW (ref 3.4–10.8)

## 2021-11-09 LAB — IMMUNOGLOBULINS A/E/G/M, SERUM
IgA: 22 mg/dL — ABNORMAL LOW (ref 64–422)
IgE (Immunoglobulin E), Serum: <2 IU/mL — ABNORMAL LOW (ref 6–495)
IgG (Immunoglobin G), Serum: 412 mg/dL — ABNORMAL LOW (ref 586–1602)
IgM (Immunoglobulin M), Srm: 9 mg/dL — ABNORMAL LOW (ref 26–217)

## 2021-12-17 ENCOUNTER — Other Ambulatory Visit: Payer: Self-pay | Admitting: Nurse Practitioner

## 2021-12-25 ENCOUNTER — Encounter: Payer: Self-pay | Admitting: Internal Medicine

## 2021-12-25 NOTE — Telephone Encounter (Signed)
Comprehensive Metabolic Panel (CMP) ?Order: [id number] ? Ref Range & Units 3 wk ago  ?Potassium 3.6 - 5.1 mmol/L 4.0   ? ?

## 2022-01-16 ENCOUNTER — Other Ambulatory Visit: Payer: Self-pay

## 2022-01-16 DIAGNOSIS — C8333 Diffuse large B-cell lymphoma, intra-abdominal lymph nodes: Secondary | ICD-10-CM

## 2022-01-17 ENCOUNTER — Encounter: Payer: Self-pay | Admitting: Internal Medicine

## 2022-01-17 ENCOUNTER — Inpatient Hospital Stay: Payer: Medicare PPO

## 2022-01-17 ENCOUNTER — Inpatient Hospital Stay: Payer: Medicare PPO | Attending: Internal Medicine | Admitting: Internal Medicine

## 2022-01-17 DIAGNOSIS — Z8572 Personal history of non-Hodgkin lymphomas: Secondary | ICD-10-CM | POA: Diagnosis present

## 2022-01-17 DIAGNOSIS — C8333 Diffuse large B-cell lymphoma, intra-abdominal lymph nodes: Secondary | ICD-10-CM | POA: Diagnosis not present

## 2022-01-17 DIAGNOSIS — Z95828 Presence of other vascular implants and grafts: Secondary | ICD-10-CM

## 2022-01-17 DIAGNOSIS — D849 Immunodeficiency, unspecified: Secondary | ICD-10-CM | POA: Diagnosis present

## 2022-01-17 LAB — CBC WITH DIFFERENTIAL/PLATELET
Abs Immature Granulocytes: 0.25 10*3/uL — ABNORMAL HIGH (ref 0.00–0.07)
Basophils Absolute: 0 10*3/uL (ref 0.0–0.1)
Basophils Relative: 1 %
Eosinophils Absolute: 0.1 10*3/uL (ref 0.0–0.5)
Eosinophils Relative: 5 %
HCT: 35.7 % — ABNORMAL LOW (ref 36.0–46.0)
Hemoglobin: 11.7 g/dL — ABNORMAL LOW (ref 12.0–15.0)
Immature Granulocytes: 8 %
Lymphocytes Relative: 12 %
Lymphs Abs: 0.4 10*3/uL — ABNORMAL LOW (ref 0.7–4.0)
MCH: 32.7 pg (ref 26.0–34.0)
MCHC: 32.8 g/dL (ref 30.0–36.0)
MCV: 99.7 fL (ref 80.0–100.0)
Monocytes Absolute: 0.6 10*3/uL (ref 0.1–1.0)
Monocytes Relative: 19 %
Neutro Abs: 1.8 10*3/uL (ref 1.7–7.7)
Neutrophils Relative %: 55 %
Platelets: 172 10*3/uL (ref 150–400)
RBC: 3.58 MIL/uL — ABNORMAL LOW (ref 3.87–5.11)
RDW: 13.2 % (ref 11.5–15.5)
Smear Review: NORMAL
WBC: 3.1 10*3/uL — ABNORMAL LOW (ref 4.0–10.5)
nRBC: 0 % (ref 0.0–0.2)

## 2022-01-17 LAB — COMPREHENSIVE METABOLIC PANEL
ALT: 24 U/L (ref 0–44)
AST: 24 U/L (ref 15–41)
Albumin: 3.6 g/dL (ref 3.5–5.0)
Alkaline Phosphatase: 58 U/L (ref 38–126)
Anion gap: 8 (ref 5–15)
BUN: 19 mg/dL (ref 8–23)
CO2: 25 mmol/L (ref 22–32)
Calcium: 8.5 mg/dL — ABNORMAL LOW (ref 8.9–10.3)
Chloride: 105 mmol/L (ref 98–111)
Creatinine, Ser: 0.9 mg/dL (ref 0.44–1.00)
GFR, Estimated: >60 mL/min (ref >60)
Glucose, Bld: 105 mg/dL — ABNORMAL HIGH (ref 70–99)
Potassium: 3.8 mmol/L (ref 3.5–5.1)
Sodium: 138 mmol/L (ref 135–145)
Total Bilirubin: 0.3 mg/dL (ref 0.3–1.2)
Total Protein: 6.4 g/dL — ABNORMAL LOW (ref 6.5–8.1)

## 2022-01-17 LAB — LACTATE DEHYDROGENASE: LDH: 200 U/L — ABNORMAL HIGH (ref 98–192)

## 2022-01-17 MED ORDER — SODIUM CHLORIDE 0.9% FLUSH
10.0000 mL | Freq: Once | INTRAVENOUS | Status: AC
  Administered 2022-01-17: 10 mL via INTRAVENOUS
  Filled 2022-01-17: qty 10

## 2022-01-17 MED ORDER — HEPARIN SOD (PORK) LOCK FLUSH 100 UNIT/ML IV SOLN
500.0000 [IU] | Freq: Once | INTRAVENOUS | Status: AC
  Administered 2022-01-17: 500 [IU] via INTRAVENOUS
  Filled 2022-01-17: qty 5

## 2022-01-17 MED ORDER — AMOXICILLIN-POT CLAVULANATE 875-125 MG PO TABS: 1.0000 | ORAL_TABLET | Freq: Two times a day (BID) | ORAL | 0 refills | Status: DC

## 2022-01-17 NOTE — Assessment & Plan Note (Addendum)
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 OCT 2022- PET [[facility]]-Stable mildly hypermetabolic soft tissue nodule lateral to the left psoas muscle. Marland Kitchen Resolution of the uptake in the left quadratus lumborum muscle and in the subcutaneous tissue overlying the left gluteal muscle.   #Clinically stable.  We will plan to follow-up PET scan in about a month.   #Secondary immunodeficiency -multiple infections [UTI/resipiratory; COVID]-lost 20 pounds-October 2022 [facility] IgG-156.  Recommend immunoglobulin infusions every month [trough goal: > 500].  Status post 2 infusions.  Tolerating well- see below.  # Prophylactic antibiotics with Valtrex and Bactrim.  Awaiting  CD4/CD8 count.  # Peripheral neuropathy/left foot drop-STABLE>   # Acute sinusitis-likely secondary to secondary immunodeficiency.  IVIG as above; recommend evaluation with ENT.; start augumentin x 5 days.  Prescription sent.  # ILD- STABLE.   # Mediport-IV access-stable; port flush.  # DISPOSITION: # Referral to ENT [place] re: recurrent sinusitis # proceed with IVIG tomorrow.  # follow up in 4 weeks--MD; labs- cbc/cmp/LDH;quantitative immunoglobilun/ CD 4/CD8 count- port-D-2  IVIG infusion; PET scan prior- Dr.B  Addendum: 6/1-patient noted to have fever of 101; held IVIG infusion/postponed by 1 week.  Finish off antibiotics as ordered.  Move appointments by 1 week.

## 2022-01-17 NOTE — Progress Notes (Unsigned)
[place] OFFICE PROGRESS NOTE  Patient Care Team: Rusty Aus, [name] Cammie Sickle, MD as Consulting Physician (Hematology and Oncology) Efrain Sella, MD as Consulting Physician (Gastroenterology)   Cancer Staging  No matching staging information was found for the patient.   Oncology History Overview Note  DEC 2017- DLBCL "TRIPLE HIT [myc/ bcl-2/bcl-6 gene rearrangement FISH]" ~10 cm mass RP LN; STAGE II [jan 2018- BMBx-NEG]; Jan 8th R-CHOP;   # JAN 29th 2018- DA-R EPOCH x5 ; PET CR; s/p RT [last 03/18/2017]  # FEB-MARCH 2019- RECURRENCE of DLBCL [s/p RP Bx; 4 cm mass inferior to Left Kidney] ; II [facility]; Dr.Grover.   # April 3rd 2019- R-GDP [carbo]; MAY 2019- PET PR; NOV 2019- PR  #August 2020-recurrence bulky left pelvis/retroperitoneal [9-10 cm]; no biopsy. #August 24th-Rituxan weekly cycle #1; prednisone 60 mg a day.   06/03/2019 - Chemotherapy  BMT OP CAR-T LYMPHODEPLETION FLU/CY + IP AXICABTAGENE CILOLEUCEL (YESCARTA) Fludarabine 30 mg/m2 IV Days 1, 2, 3 Cyclophosphamide 500 mg/m2 IV Days 1, 2, 3 Axicabtagene ciloleucel target dose 2 x 10^6 CAR-positive viable T cells per kg body weight (maximum of 2 x 10^8 CAR-positive viable T cells) on Day 6.  # SEP 2020- 69monthPET-[facility]- significant PR.   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     05/07/2019 - 05/07/2019 Chemotherapy   The patient had riTUXimab-hyaluronidase human (RITUXAN HYCELA) 1400-23400 MG -UT/11.7ML injection SQ 1,400 mg, 1,400 mg, Subcutaneous,  Once, 1 of 4 cycles Administration: 1,400 mg (05/07/2019)   for chemotherapy treatment.       INTERVAL HISTORY: Patient is accompanied by husband. she ambulating independently.  [name]89y.[id number].  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  Patient had 2 infusions of IVIG.  Tolerated them fairly well.  However patient complains of nasal pressure bilaterally-left more than right.  Patient denies any nausea vomiting no abdominal pain.  No headaches.  No night sweats.  No worsening back pain.    Review of Systems  Constitutional:  Positive for malaise/fatigue. Negative for chills, diaphoresis, fever and weight loss.  HENT:  Negative for nosebleeds and sore throat.   Eyes:  Negative for double vision.  Respiratory:  Positive for shortness of breath. Negative for cough, hemoptysis, sputum production and wheezing.   Cardiovascular:  Negative for chest pain, palpitations, orthopnea and leg swelling.  Gastrointestinal:  Negative for abdominal pain, blood in stool, constipation, diarrhea, melena and vomiting.  Genitourinary:  Negative for dysuria, frequency and urgency.  Musculoskeletal:  Positive for joint pain.  Skin: Negative.  Negative for itching and rash.  Neurological:  Positive for tingling. Negative for focal weakness and weakness.  Endo/Heme/Allergies:  Does not bruise/bleed easily.  Psychiatric/Behavioral:  Negative for depression. The patient is not nervous/anxious and does not have insomnia.  PAST MEDICAL HISTORY :  Past Medical History:  Diagnosis Date  . Diabetes mellitus without complication  ([place])   . Diffuse large B-cell lymphoma (HCC)    Chemo + rad tx's  . Dysplastic nevus 03/14/2020   left inf med scapula moderate atypia   . Dysplastic nevus 03/14/2020   left lat breast parallel to areaola, moderate atypia   . Heart murmur   . History of chemotherapy 07/08/2017  . History of gastric ulcer   . History of radiation therapy 07/08/2017  . Hypercholesterolemia   . Hypertension   . ILD (interstitial lung disease) ([place])    8 yrs ago  . Lymphadenopathy     PAST SURGICAL HISTORY :   Past Surgical History:  Procedure Laterality Date  . BREAST BIOPSY Left 2010   neg  . CATARACT EXTRACTION W/PHACO Left 10/16/2017   Procedure: CATARACT EXTRACTION PHACO AND INTRAOCULAR LENS PLACEMENT ([place]) COMPLICATED DIABETIC LEFT;  Surgeon: Leandrew Koyanagi, MD;  Location: [place];  Service: Ophthalmology;  Laterality: Left;  IRIS HOOKS Diabetic - oral meds  . COLONOSCOPY N/A 06/19/2021   Procedure: COLONOSCOPY;  Surgeon: Annamaria Helling, DO;  Location[facility] ENDOSCOPY;  Service: Gastroenterology;  Laterality: N/A;  . ESOPHAGOGASTRODUODENOSCOPY N/A 06/19/2021   Procedure: ESOPHAGOGASTRODUODENOSCOPY (EGD);  Surgeon: Annamaria Helling, DO;  Location: [facility] ENDOSCOPY;  Service: Gastroenterology;  Laterality: N/A;  . HERNIA REPAIR    . PARTIAL HYSTERECTOMY     age 49  . PERIPHERAL VASCULAR CATHETERIZATION N/A 08/22/2016   Procedure: [name];  Surgeon: [name], MD;  Location: [place] CV LAB;  Service: Cardiovascular;  Laterality: N/A;  . TONSILLECTOMY      FAMILY HISTORY :   Family History  Problem Relation Age of Onset  . Heart disease Mother   . Heart disease Father   . Heart disease Brother     SOCIAL HISTORY:   Social History   Tobacco Use  . Smoking status: Former    Types: Cigarettes    Quit date: 08/21/1958    Years since quitting: 63.4  . Smokeless tobacco: Never  Vaping Use  . Vaping Use: Never used  Substance Use Topics   . Alcohol use: No  . Drug use: No    ALLERGIES:  is allergic to levaquin [levofloxacin in d5w], cefuroxime axetil, and doxycycline.  MEDICATIONS:  Current Outpatient Medications  Medication Sig Dispense Refill  . acetaminophen (TYLENOL) 325 MG tablet Take by mouth.    Marland Kitchen amoxicillin-clavulanate (AUGMENTIN) 875-125 MG tablet Take 1 tablet by mouth 2 (two) times daily. 10 tablet 0  . b complex vitamins tablet Take 1 tablet by mouth daily.    Marland Kitchen gabapentin (NEURONTIN) 300 MG capsule Take 1 capsule (300 mg total) by mouth 2 (two) times daily. (Patient taking differently: Take 300 mg by mouth 3 (three) times daily. 1 in am and 2 at night) 60 capsule 3  . hyoscyamine (LEVSIN SL) 0.125 MG SL tablet Place 0.125 mg under the tongue every 4 (four) hours as needed.    Marland Kitchen ibuprofen (ADVIL) 200 MG tablet Take by mouth.    . latanoprost (XALATAN) 0.005 % ophthalmic solution Place 1 drop into both eyes at bedtime.     . Magnesium 400 MG TABS Take 1 tablet by mouth daily.    . metoprolol tartrate (LOPRESSOR) 25 MG tablet TAKE 1 TABLET BY MOUTH TWICE DAILY (Patient taking differently: 12.5 mg 2 (two) times daily. (BETA BLOCKER)) 180 tablet 0  . pantoprazole (PROTONIX) 40  MG tablet Take 1 tablet by mouth in the morning and at bedtime.    . potassium chloride (KLOR-CON) 10 MEQ tablet Take 1 tablet by mouth once daily 30 tablet 0  . pravastatin (PRAVACHOL) 40 MG tablet Take 40 mg by mouth daily.    Marland Kitchen sulfamethoxazole-trimethoprim (BACTRIM DS) 800-160 MG tablet Take 1 tablet by mouth 3 (three) times a week.    . valACYclovir (VALTREX) 500 MG tablet Take 1 tablet by mouth daily.    . montelukast (SINGULAIR) 10 MG tablet Take 1 tablet (10 mg total) by mouth at bedtime. One a day. (Patient not taking: Reported on 01/17/2022) 30 tablet 11  . ondansetron (ZOFRAN) 8 MG tablet Take by mouth. (Patient not taking: Reported on 01/17/2022)    . potassium chloride (KLOR-CON) 10 MEQ tablet Take by mouth.    . prochlorperazine  (COMPAZINE) 10 MG tablet Take 10 mg by mouth every 6 (six) hours as needed for nausea or vomiting. (Patient not taking: Reported on 01/17/2022)     No current facility-administered medications for this visit.   Facility-Administered Medications Ordered in Other Visits  Medication Dose Route Frequency Provider Last Rate Last Admin  . 0.9 %  sodium chloride infusion   Intravenous Continuous Cammie Sickle, MD 10 mL/hr at 06/19/21 1230 Continued from Pre-op at 06/19/21 1230  . 0.9 %  sodium chloride infusion   Intravenous Continuous Cammie Sickle, MD   Stopped at 12/26/16 1011  . 0.9 %  sodium chloride infusion   Intravenous Continuous Charlaine Dalton R, MD 10 mL/hr at 12/27/16 0930 New Bag at 12/27/16 0930  . heparin lock flush 100 unit/mL  500 Units Intravenous Once Charlaine Dalton R, MD      . heparin lock flush 100 unit/mL  500 Units Intravenous Once Charlaine Dalton R, MD      . sodium chloride flush (NS) 0.9 % injection 10 mL  10 mL Intravenous PRN Cammie Sickle, MD   10 mL at 11/06/16 1000  . sodium chloride flush (NS) 0.9 % injection 10 mL  10 mL Intravenous PRN Cammie Sickle, MD   10 mL at 11/07/16 0905  . sodium chloride flush (NS) 0.9 % injection 10 mL  10 mL Intravenous PRN Charlaine Dalton R, MD      . sodium chloride flush (NS) 0.9 % injection 10 mL  10 mL Intravenous PRN Cammie Sickle, MD   10 mL at 12/05/16 1030  . sodium chloride flush (NS) 0.9 % injection 10 mL  10 mL Intravenous PRN Cammie Sickle, MD   10 mL at 12/27/16 0930  . sodium chloride flush (NS) 0.9 % injection 10 mL  10 mL Intravenous PRN Cammie Sickle, MD   10 mL at 09/27/20 1305    PHYSICAL EXAMINATION: ECOG PERFORMANCE STATUS: 1 - Symptomatic but completely ambulatory  BP (!) 134/59 (BP Location: Left Arm, Patient Position: Sitting, Cuff Size: Normal)   Pulse 75   Temp 99.7 F (37.6 C) (Tympanic)   Ht '5\' 7"'$  (1.702 m)   Wt 134 lb 6.4 oz (61 kg)    SpO2 98%   BMI 21.05 kg/m   Filed Weights   01/17/22 1035  Weight: 134 lb 6.4 oz (61 kg)   Tenderness left maxillary sinus area  Physical Exam HENT:     Head: Normocephalic and atraumatic.     Mouth/Throat:     Pharynx: No oropharyngeal exudate.  Eyes:     Pupils: Pupils are equal, round,  and reactive to light.  Cardiovascular:     Rate and Rhythm: Normal rate and regular rhythm.  Pulmonary:     Effort: No respiratory distress.     Breath sounds: No wheezing.     Comments: Bilateral crackles at the bases.  No wheeze. Abdominal:     General: Bowel sounds are normal. There is no distension.     Palpations: Abdomen is soft. There is no mass.     Tenderness: There is no abdominal tenderness. There is no guarding or rebound.  Musculoskeletal:        General: No tenderness. Normal range of motion.     Cervical back: Normal range of motion and neck supple.  Skin:    General: Skin is warm.  Neurological:     Mental Status: She is alert and oriented to person, place, and time.  Psychiatric:        Mood and Affect: Affect normal.   LABORATORY DATA:  I have reviewed the data as listed    Component Value Date/Time   NA 138 01/17/2022 1025   K 3.8 01/17/2022 1025   K 4.1 07/01/2014 1429   CL 105 01/17/2022 1025   CO2 25 01/17/2022 1025   GLUCOSE 105 (H) 01/17/2022 1025   BUN 19 01/17/2022 1025   CREATININE 0.90 01/17/2022 1025   CALCIUM 8.5 (L) 01/17/2022 1025   PROT 6.4 (L) 01/17/2022 1025   ALBUMIN 3.6 01/17/2022 1025   AST 24 01/17/2022 1025   ALT 24 01/17/2022 1025   ALKPHOS 58 01/17/2022 1025   BILITOT 0.3 01/17/2022 1025   GFRNONAA >60 01/17/2022 1025   GFRAA >60 03/28/2020 1305    No results found for: SPEP, UPEP  Lab Results  Component Value Date   WBC 3.2 (L) 01/17/2022   WBC 3.1 (L) 01/17/2022   NEUTROABS 2.0 01/17/2022   NEUTROABS 1.8 01/17/2022   HGB 11.8 01/17/2022   HGB 11.7 (L) 01/17/2022   HCT 34.9 01/17/2022   HCT 35.7 (L) 01/17/2022   MCV  96 01/17/2022   MCV 99.7 01/17/2022   PLT 190 01/17/2022   PLT 172 01/17/2022      Chemistry      Component Value Date/Time   NA 138 01/17/2022 1025   K 3.8 01/17/2022 1025   K 4.1 07/01/2014 1429   CL 105 01/17/2022 1025   CO2 25 01/17/2022 1025   BUN 19 01/17/2022 1025   CREATININE 0.90 01/17/2022 1025      Component Value Date/Time   CALCIUM 8.5 (L) 01/17/2022 1025   ALKPHOS 58 01/17/2022 1025   AST 24 01/17/2022 1025   ALT 24 01/17/2022 1025   BILITOT 0.3 01/17/2022 1025       RADIOGRAPHIC STUDIES: I have personally reviewed the radiological images as listed and agreed with the findings in the report. No results found.   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 OCT 2022- PET [[facility]]-Stable mildly hypermetabolic soft tissue nodule lateral to the left psoas muscle. Marland Kitchen Resolution of the uptake in the left quadratus lumborum muscle and in the subcutaneous tissue overlying the left gluteal muscle.   #Clinically stable.  We will plan to follow-up PET scan in about a month.   #Secondary immunodeficiency -multiple infections [UTI/resipiratory; COVID]-lost 20 pounds-October 2022 [facility] IgG-156.  Recommend immunoglobulin infusions every month [trough goal: > 500].  Status post 2 infusions.  Tolerating well- see below.  # Prophylactic antibiotics with Valtrex and Bactrim.  Awaiting  CD4/CD8 count.  # Peripheral neuropathy/left foot drop-STABLE>   # Acute sinusitis-likely secondary to secondary immunodeficiency.  IVIG as above; recommend evaluation with ENT.; start augumentin x 5 days.  Prescription sent.  # ILD- STABLE.   # Mediport-IV access-stable; port flush.  # DISPOSITION: # Referral to ENT [place] re: recurrent sinusitis # proceed with IVIG tomorrow.  # follow up in 4 weeks--MD; labs- cbc/cmp/LDH;quantitative  immunoglobilun/ CD 4/CD8 count- port-D-2  IVIG infusion; PET scan prior- Dr.B  Addendum: 6/1-patient noted to have fever of 101; held IVIG infusion/postponed by 1 week.  Finish off antibiotics as ordered.  Move appointments by 1 week.   Orders Placed This Encounter  Procedures  . NM PET Image Restage (PS) Skull Base to Thigh (F-18 FDG)    Standing Status:   Future    Standing Expiration Date:   01/18/2023    Order Specific Question:   If indicated for the ordered procedure, I authorize the administration of a radiopharmaceutical per Radiology protocol    Answer:   Yes    Order Specific Question:   Preferred imaging location?    Answer:   [facility]  . CBC with Differential/Platelet    Standing Status:   Future    Standing Expiration Date:   01/18/2023  . Comprehensive metabolic panel    Standing Status:   Future    Standing Expiration Date:   01/18/2023  . Lactate dehydrogenase    Standing Status:   Future    Standing Expiration Date:   01/18/2023  . T-helper cells CD4/CD8 %    Standing Status:   Future    Standing Expiration Date:   01/18/2023  . CMV DNA, quantitative, PCR    Standing Status:   Future    Standing Expiration Date:   01/18/2023  . Ambulatory referral to ENT    Referral Priority:   Routine    Referral Type:   Consultation    Referral Reason:   Specialty Services Required    Requested Specialty:   Otolaryngology    Number of Visits Requested:   1   All questions were answered. The patient knows to call the clinic with any problems, questions or concerns.      Cammie Sickle, MD 01/18/2022 2:34 PM

## 2022-01-17 NOTE — Progress Notes (Unsigned)
Pt states she has had sinus/head/chest congestion with cough, body aches x3weeks. Concerned she may have a sinus infection.

## 2022-01-18 ENCOUNTER — Inpatient Hospital Stay: Payer: Medicare PPO

## 2022-01-18 ENCOUNTER — Encounter: Payer: Self-pay | Admitting: Internal Medicine

## 2022-01-18 LAB — T-HELPER CELLS CD4/CD8 %
% CD 4 Pos. Lymph.: 25.3 % — ABNORMAL LOW (ref 30.8–58.5)
Absolute CD 4 Helper: 101 /uL — ABNORMAL LOW (ref 359–1519)
Basophils Absolute: 0 10*3/uL (ref 0.0–0.2)
Basos: 1 %
CD3+CD4+ Cells/CD3+CD8+ Cells Bld: 0.64 — ABNORMAL LOW (ref 0.92–3.72)
CD3+CD8+ Cells # Bld: 159 /uL (ref 109–897)
CD3+CD8+ Cells NFr Bld: 39.8 % — ABNORMAL HIGH (ref 12.0–35.5)
EOS (ABSOLUTE): 0.2 10*3/uL (ref 0.0–0.4)
Eos: 5 %
Hematocrit: 34.9 % (ref 34.0–46.6)
Hemoglobin: 11.8 g/dL (ref 11.1–15.9)
Immature Grans (Abs): 0.1 10*3/uL (ref 0.0–0.1)
Immature Granulocytes: 4 %
Lymphocytes Absolute: 0.4 10*3/uL — ABNORMAL LOW (ref 0.7–3.1)
Lymphs: 12 %
MCH: 32.4 pg (ref 26.6–33.0)
MCHC: 33.8 g/dL (ref 31.5–35.7)
MCV: 96 fL (ref 79–97)
Monocytes Absolute: 0.5 10*3/uL (ref 0.1–0.9)
Monocytes: 16 %
Neutrophils Absolute: 2 10*3/uL (ref 1.4–7.0)
Neutrophils: 62 %
Platelets: 190 10*3/uL (ref 150–450)
RBC: 3.64 x10E6/uL — ABNORMAL LOW (ref 3.77–5.28)
RDW: 13.5 % (ref 11.7–15.4)
WBC: 3.2 10*3/uL — ABNORMAL LOW (ref 3.4–10.8)

## 2022-01-18 NOTE — Progress Notes (Signed)
Pt with temp and sinus infection per Dr Rogue Bussing no treatment today

## 2022-01-20 LAB — IMMUNOGLOBULINS A/E/G/M, SERUM
IgA: 22 mg/dL — ABNORMAL LOW (ref 64–422)
IgE (Immunoglobulin E), Serum: <2 IU/mL — ABNORMAL LOW (ref 6–495)
IgG (Immunoglobin G), Serum: 344 mg/dL — ABNORMAL LOW (ref 586–1602)
IgM (Immunoglobulin M), Srm: 8 mg/dL — ABNORMAL LOW (ref 26–217)

## 2022-01-23 ENCOUNTER — Telehealth: Payer: Self-pay

## 2022-01-23 NOTE — Telephone Encounter (Signed)
Referral to  ent faxed. Will check on appt status later this week.

## 2022-01-24 ENCOUNTER — Other Ambulatory Visit: Payer: Self-pay | Admitting: Internal Medicine

## 2022-01-24 NOTE — Telephone Encounter (Signed)
Component Ref Range & Units 7 d ago (01/17/22) 2 mo ago (11/07/21) 3 mo ago (09/28/21) 1 yr ago (12/27/20) 1 yr ago (09/27/20) 1 yr ago (06/28/20) 1 yr ago (03/28/20)  Potassium 3.5 - 5.1 mmol/L 3.8  3.4 Low   3.8  3.6  3.8  4.0  4.0

## 2022-01-25 ENCOUNTER — Inpatient Hospital Stay: Payer: Medicare PPO | Attending: Internal Medicine

## 2022-01-25 ENCOUNTER — Inpatient Hospital Stay: Payer: Medicare PPO | Attending: Oncology

## 2022-01-25 ENCOUNTER — Other Ambulatory Visit: Payer: Self-pay | Admitting: Internal Medicine

## 2022-01-25 VITALS — BP 117/45 | HR 80 | Temp 99.1°F | Resp 19 | Wt 135.1 lb

## 2022-01-25 DIAGNOSIS — D849 Immunodeficiency, unspecified: Secondary | ICD-10-CM | POA: Insufficient documentation

## 2022-01-25 DIAGNOSIS — E860 Dehydration: Secondary | ICD-10-CM

## 2022-01-25 DIAGNOSIS — C8333 Diffuse large B-cell lymphoma, intra-abdominal lymph nodes: Secondary | ICD-10-CM | POA: Diagnosis present

## 2022-01-25 DIAGNOSIS — Z87891 Personal history of nicotine dependence: Secondary | ICD-10-CM | POA: Insufficient documentation

## 2022-01-25 MED ORDER — METHYLPREDNISOLONE SODIUM SUCC 125 MG IJ SOLR
125.0000 mg | Freq: Once | INTRAMUSCULAR | Status: AC
  Administered 2022-01-25: 125 mg via INTRAVENOUS

## 2022-01-25 MED ORDER — DEXTROSE 5 % IV SOLN
Freq: Once | INTRAVENOUS | Status: AC
  Filled 2022-01-25: qty 250

## 2022-01-25 MED ORDER — ACETAMINOPHEN 325 MG PO TABS
650.0000 mg | ORAL_TABLET | Freq: Once | ORAL | Status: AC
  Administered 2022-01-25: 650 mg via ORAL
  Filled 2022-01-25: qty 2

## 2022-01-25 MED ORDER — HEPARIN SOD (PORK) LOCK FLUSH 100 UNIT/ML IV SOLN
500.0000 [IU] | Freq: Once | INTRAVENOUS | Status: AC | PRN
  Filled 2022-01-25: qty 5

## 2022-01-25 MED ORDER — HEPARIN SOD (PORK) LOCK FLUSH 100 UNIT/ML IV SOLN
INTRAVENOUS | Status: AC
  Administered 2022-01-25: 500 [IU]
  Filled 2022-01-25: qty 5

## 2022-01-25 MED ORDER — SODIUM CHLORIDE 0.9 % IV SOLN
Freq: Once | INTRAVENOUS | Status: AC
  Filled 2022-01-25: qty 250

## 2022-01-25 MED ORDER — DIPHENHYDRAMINE HCL 25 MG PO CAPS
25.0000 mg | ORAL_CAPSULE | Freq: Once | ORAL | Status: AC
  Administered 2022-01-25: 25 mg via ORAL
  Filled 2022-01-25: qty 1

## 2022-01-25 MED ORDER — MONTELUKAST SODIUM 10 MG PO TABS
10.0000 mg | ORAL_TABLET | Freq: Once | ORAL | Status: AC
  Administered 2022-01-25: 10 mg via ORAL
  Filled 2022-01-25: qty 1

## 2022-01-25 MED ORDER — MEPERIDINE HCL 25 MG/ML IJ SOLN
25.0000 mg | Freq: Once | INTRAMUSCULAR | Status: AC
  Administered 2022-01-25: 25 mg via INTRAVENOUS
  Filled 2022-01-25: qty 1

## 2022-01-25 MED ORDER — IMMUNE GLOBULIN (HUMAN) 5 GM/50ML IV SOLN
25.0000 g | Freq: Once | INTRAVENOUS | Status: AC
  Administered 2022-01-25: 25 g via INTRAVENOUS
  Filled 2022-01-25: qty 200

## 2022-01-25 MED ORDER — FAMOTIDINE IN NACL 20-0.9 MG/50ML-% IV SOLN
20.0000 mg | Freq: Once | INTRAVENOUS | Status: AC
  Administered 2022-01-25: 20 mg via INTRAVENOUS

## 2022-01-25 MED ORDER — DIPHENHYDRAMINE HCL 50 MG/ML IJ SOLN
25.0000 mg | Freq: Once | INTRAMUSCULAR | Status: AC
  Administered 2022-01-25: 25 mg via INTRAVENOUS

## 2022-01-25 NOTE — Progress Notes (Signed)
Ordered the PET scan- whole body as per request from ENT.  Please schedule. Thanks GB

## 2022-01-25 NOTE — Telephone Encounter (Signed)
I spoke with [place] from Dr. Milas Hock office. Pt was seen 01/24/22. Dr. Pryor Ochoa would like to know if you can add to the PET scan she is having next week to include the head? Currently it is ordered or the base of the skull to thigh.

## 2022-01-25 NOTE — Progress Notes (Signed)
Nutrition Follow-up:  Patient with history of recurrent b-cell lymphoma secondary immunodeficiency  Patient receiving IVIG  Met with patient during infusion.  Patient reports that her appetite is better.  Eating typically breakfast and lunch but snacks more in the late evening.  Still has issues with diarrhea.  "I may go a week or so and not have diarrhea then have it for several days."     Medications: reviewed  Labs: reviewed  Anthropometrics:   Weight 135 lb 2.3 oz increased 129 lb 4.8 oz on 10/19/21   NUTRITION DIAGNOSIS: Unintentional weight loss improved   INTERVENTION:  Continue current eating pattern as weight increasing.  Encouraged good sources of protein Monitor diarrhea and avoid foods that trigger Contact information provided     NEXT VISIT: no follow-up RD available as needed  Rael Yo B. [name], [place], [place] Registered Dietitian [phone]

## 2022-01-25 NOTE — Progress Notes (Signed)
IVIG started at 0916 and titrated per policy. At 1001 pt was on max rate and at 1040 pt expressed to RN that she "felt very cold, slight chest pain, and is having a headache." IVIG stopped immediately, D5W was used to flush line and then  per protocol 1 L NS started, solumedrol 125 mg IV given, benadryl 25 mg IV given, pepcid 20 mg IVPB given. ** See MAR for administration times.** Vitals were stable ** See flowsheet for vitalsAnder Purpura NP notified.  Evangelyn Crouse NP at chairside at 1055. Rigors noted at this time and pt states "I'm cold in my bones and I am having chills." Per Lorann Tani, NP to administer demerol 25 mg IV at this time. At 1101, pt expressed to RN that she was now having back pain and pain between her shoulder blades. At 1106 back pain resolved but chest pain remains. Per [name] NP, continue fluids while treatment plan is discussed with Dr Rogue Bussing. At 1117 per pt "chest pain has eased". Vitals stable. Zakariyah Freimark NP arrived at chairside at 1155 to discuss rechallenging IVIG. Per pt she does not want to rechallenge IVIG at this time. NP and MD made aware. Per MD pt may be discharged. Pt completed the 1 L of NS prior to discharge, vitals stable at discharge. RN educated pt on the importance of notifying the clinic if any complications occur at home and when to seek emergency care. Pt verbalized understanding. Pt stable at discharge.   Izaia Say [employer]

## 2022-01-25 NOTE — Progress Notes (Signed)
Per [employer] Auth# [id number] valid 01/25/22-02/24/22 for [id number]

## 2022-01-25 NOTE — Telephone Encounter (Signed)
Notified Sandy at ENT order for scan was changed to whole body.

## 2022-01-25 NOTE — Progress Notes (Signed)
Appt resch and pt notified.

## 2022-01-29 ENCOUNTER — Encounter: Payer: Self-pay | Admitting: Internal Medicine

## 2022-02-07 ENCOUNTER — Ambulatory Visit
Admission: RE | Admit: 2022-02-07 | Discharge: 2022-02-07 | Disposition: A | Discharge status: Home / Self Care | Payer: Medicare PPO | Source: Ambulatory Visit | Attending: Internal Medicine | Admitting: Internal Medicine

## 2022-02-07 DIAGNOSIS — C8333 Diffuse large B-cell lymphoma, intra-abdominal lymph nodes: Secondary | ICD-10-CM | POA: Diagnosis present

## 2022-02-07 LAB — GLUCOSE, CAPILLARY: Glucose-Capillary: 94 mg/dL (ref 70–99)

## 2022-02-07 MED ORDER — FLUDEOXYGLUCOSE F - 18 (FDG) INJECTION
7.0000 | Freq: Once | INTRAVENOUS | Status: AC | PRN
  Administered 2022-02-07: 7.65 via INTRAVENOUS

## 2022-02-08 ENCOUNTER — Other Ambulatory Visit: Payer: Medicare PPO

## 2022-02-14 ENCOUNTER — Other Ambulatory Visit: Payer: Medicare PPO

## 2022-02-14 ENCOUNTER — Ambulatory Visit: Payer: Medicare PPO | Admitting: Internal Medicine

## 2022-02-15 ENCOUNTER — Ambulatory Visit: Payer: Medicare PPO

## 2022-02-21 ENCOUNTER — Inpatient Hospital Stay: Payer: Medicare PPO | Attending: Internal Medicine

## 2022-02-21 ENCOUNTER — Inpatient Hospital Stay: Payer: Medicare PPO | Admitting: Internal Medicine

## 2022-02-21 DIAGNOSIS — Z923 Personal history of irradiation: Secondary | ICD-10-CM | POA: Diagnosis not present

## 2022-02-21 DIAGNOSIS — Z9221 Personal history of antineoplastic chemotherapy: Secondary | ICD-10-CM | POA: Diagnosis not present

## 2022-02-21 DIAGNOSIS — Z87891 Personal history of nicotine dependence: Secondary | ICD-10-CM | POA: Diagnosis not present

## 2022-02-21 DIAGNOSIS — C8333 Diffuse large B-cell lymphoma, intra-abdominal lymph nodes: Secondary | ICD-10-CM | POA: Diagnosis not present

## 2022-02-21 DIAGNOSIS — Z8572 Personal history of non-Hodgkin lymphomas: Secondary | ICD-10-CM | POA: Insufficient documentation

## 2022-02-21 DIAGNOSIS — D849 Immunodeficiency, unspecified: Secondary | ICD-10-CM | POA: Diagnosis not present

## 2022-02-21 DIAGNOSIS — Z95828 Presence of other vascular implants and grafts: Secondary | ICD-10-CM

## 2022-02-21 LAB — CBC WITH DIFFERENTIAL/PLATELET
Abs Immature Granulocytes: 0.17 10*3/uL — ABNORMAL HIGH (ref 0.00–0.07)
Basophils Absolute: 0 10*3/uL (ref 0.0–0.1)
Basophils Relative: 1 %
Eosinophils Absolute: 0.3 10*3/uL (ref 0.0–0.5)
Eosinophils Relative: 8 %
HCT: 34.9 % — ABNORMAL LOW (ref 36.0–46.0)
Hemoglobin: 11.6 g/dL — ABNORMAL LOW (ref 12.0–15.0)
Immature Granulocytes: 5 %
Lymphocytes Relative: 11 %
Lymphs Abs: 0.3 10*3/uL — ABNORMAL LOW (ref 0.7–4.0)
MCH: 32.6 pg (ref 26.0–34.0)
MCHC: 33.2 g/dL (ref 30.0–36.0)
MCV: 98 fL (ref 80.0–100.0)
Monocytes Absolute: 0.5 10*3/uL (ref 0.1–1.0)
Monocytes Relative: 14 %
Neutro Abs: 2 10*3/uL (ref 1.7–7.7)
Neutrophils Relative %: 61 %
Platelets: 181 10*3/uL (ref 150–400)
RBC: 3.56 MIL/uL — ABNORMAL LOW (ref 3.87–5.11)
RDW: 13.8 % (ref 11.5–15.5)
WBC: 3.2 10*3/uL — ABNORMAL LOW (ref 4.0–10.5)
nRBC: 0 % (ref 0.0–0.2)

## 2022-02-21 LAB — COMPREHENSIVE METABOLIC PANEL
ALT: 15 U/L (ref 0–44)
AST: 21 U/L (ref 15–41)
Albumin: 3.6 g/dL (ref 3.5–5.0)
Alkaline Phosphatase: 49 U/L (ref 38–126)
Anion gap: 9 (ref 5–15)
BUN: 12 mg/dL (ref 8–23)
CO2: 25 mmol/L (ref 22–32)
Calcium: 8.6 mg/dL — ABNORMAL LOW (ref 8.9–10.3)
Chloride: 107 mmol/L (ref 98–111)
Creatinine, Ser: 0.53 mg/dL (ref 0.44–1.00)
GFR, Estimated: >60 mL/min (ref >60)
Glucose, Bld: 115 mg/dL — ABNORMAL HIGH (ref 70–99)
Potassium: 3.8 mmol/L (ref 3.5–5.1)
Sodium: 141 mmol/L (ref 135–145)
Total Bilirubin: 0.5 mg/dL (ref 0.3–1.2)
Total Protein: 6.2 g/dL — ABNORMAL LOW (ref 6.5–8.1)

## 2022-02-21 LAB — LACTATE DEHYDROGENASE: LDH: 204 U/L — ABNORMAL HIGH (ref 98–192)

## 2022-02-21 MED ORDER — SODIUM CHLORIDE 0.9% FLUSH
10.0000 mL | Freq: Once | INTRAVENOUS | Status: DC
  Filled 2022-02-21: qty 10

## 2022-02-21 MED ORDER — DEXAMETHASONE 4 MG PO TABS: 4.0000 mg | ORAL_TABLET | Freq: Two times a day (BID) | ORAL | 0 refills | Status: DC

## 2022-02-21 MED ORDER — HEPARIN SOD (PORK) LOCK FLUSH 100 UNIT/ML IV SOLN
500.0000 [IU] | Freq: Once | INTRAVENOUS | Status: AC
  Administered 2022-02-21: 500 [IU] via INTRAVENOUS
  Filled 2022-02-21: qty 5

## 2022-02-21 MED ORDER — HEPARIN SOD (PORK) LOCK FLUSH 100 UNIT/ML IV SOLN
500.0000 [IU] | Freq: Once | INTRAVENOUS | Status: DC
  Filled 2022-02-21: qty 5

## 2022-02-21 NOTE — Patient Instructions (Signed)
#   Take steroid as recommended  # also take benadryl 50 mg; and 650 tylenol tonite.

## 2022-02-21 NOTE — Progress Notes (Signed)
Patient has diarrhea that will last 3-4 days with no relief from OTC Imodium.  Trouble sleeping at night and does not take medication.   Occasional difficulty swallowing solids.  New feeling of SOBr for the past 6 weeks.    Patient reports that she does not want to receive further treatment as planned.

## 2022-02-21 NOTE — Assessment & Plan Note (Addendum)
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 No residual mass.  However. Linear metabolic activity associated the anterior RIGHT lower extremity muscle. Favor muscular trauma or strain. Lymphoma to isolated muscle less favored. [Hx of falls/trauma; no obvious swelling noted-suspect artifact].  Stable.  #Secondary immunodeficiency -multiple infections [UTI/resipiratory; COVID]-lost 20 pounds-October 2022 [facility] IgG-156.   [trough goal: > 500].  Status post 2 infusions; however infusion was held June/last month because of "infusion reaction"- [body aches; chills; chest pressure; no significant change in vital signs]-received Demerol steroids Pepcid recommend taking dexamethasone 4 mg twice daily day prior; also take Benadryl 50 mg and also Tylenol 650 night prior.  We will add dexamethasone 10 mg on the day infusion.  Blood glucose post breakfast today was 115.   # Prophylactic antibiotics with Valtrex and Bactrim.  Awaiting  CD4/CD8 count.  # Peripheral neuropathy/left foot drop-STABLE>   # Acute sinusitis-likely secondary to secondary immunodeficiency.  IVIG as above;s/p evaluation with ENT, Dr.vaught.   # ILD- STABLE.   # Mediport-IV access-stable; port flush.   # DISPOSITION: # proceed with IVIG tomorrow.  # follow up in 6 weeks--MD; labs- cbc/cmp/LDH;quantitative immunoglobilun/ CD 4/CD8 count- port-D-2  IVIG infusion;  Dr.B  # I reviewed the blood work- with the patient in detail; also reviewed the imaging independently [as summarized above]; and with the patient in detail.

## 2022-02-21 NOTE — Progress Notes (Signed)
[facility] OFFICE PROGRESS NOTE  Patient Care Team: Rusty Aus, [name] Cammie Sickle, MD as Consulting Physician (Hematology and Oncology) Efrain Sella, MD as Consulting Physician (Gastroenterology)   Cancer Staging  No matching staging information was found for the patient.   Oncology History Overview Note  DEC 2017- DLBCL "TRIPLE HIT [myc/ bcl-2/bcl-6 gene rearrangement FISH]" ~10 cm mass RP LN; STAGE II [jan 2018- BMBx-NEG]; Jan 8th R-[facility];   # JAN 29th 2018- DA-R EPOCH x5 ; PET CR; s/p RT [last 03/18/2017]  # FEB-MARCH 2019- RECURRENCE of DLBCL [s/p RP Bx; 4 cm mass inferior to Left Kidney] ; II [facility]; Dr.Grover.   # April 3rd 2019- R-GDP [carbo]; MAY 2019- PET PR; NOV 2019- PR  #August 2020-recurrence bulky left pelvis/retroperitoneal [9-10 cm]; no biopsy. #August 24th-Rituxan weekly cycle #1; prednisone 60 mg a day.   06/03/2019 - Chemotherapy  BMT OP CAR-T LYMPHODEPLETION FLU/CY + IP AXICABTAGENE CILOLEUCEL (YESCARTA) Fludarabine 30 mg/m2 IV Days 1, 2, 3 Cyclophosphamide 500 mg/m2 IV Days 1, 2, 3 Axicabtagene ciloleucel target dose 2 x 10^6 CAR-positive viable T cells per kg body weight (maximum of 2 x 10^8 CAR-positive viable T cells) on Day 6.  # SEP 2020- 19monthPET-[facility]- significant PR.   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    05/07/2019 - 05/07/2019 Chemotherapy   The patient had riTUXimab-hyaluronidase human (RITUXAN HYCELA) 1400-23400 MG -UT/11.7ML injection SQ 1,400 mg, 1,400 mg, Subcutaneous,  Once, 1 of 4 cycles Administration: 1,400 mg (05/07/2019)  for chemotherapy treatment.      INTERVAL HISTORY: Patient is accompanied by husband. she ambulating independently.  Rose [email or web address].  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  Patient had infusion reaction to IVIG in spite of steroids/Benadryl/Tylenol.  This was discontinued at last visit. Patient had 2 infusions of IVIG.  Patient is nervous about proceeding with further infusions.  No bladder infections.  Feels tired.  Mild diarrhea improved with Imodium.  No night sweats.  No worsening back pain.    Review of Systems  Constitutional:  Positive for malaise/fatigue. Negative for chills, diaphoresis, fever and weight loss.  HENT:  Negative for nosebleeds and sore throat.   Eyes:  Negative for double vision.  Respiratory:  Positive for shortness of breath. Negative for cough, hemoptysis, sputum production and wheezing.   Cardiovascular:  Negative for chest pain, palpitations, orthopnea and leg swelling.  Gastrointestinal:  Negative for abdominal pain, blood in stool, constipation, diarrhea, melena and vomiting.  Genitourinary:  Negative for dysuria, frequency and urgency.  Musculoskeletal:  Positive for joint pain.  Skin: Negative.  Negative for itching and rash.  Neurological:  Positive for tingling. Negative for focal weakness and weakness.  Endo/Heme/Allergies:  Does not bruise/bleed easily.  Psychiatric/Behavioral:  Negative for depression. The patient is not nervous/anxious and  does not have insomnia.     PAST MEDICAL HISTORY :  Past Medical History:  Diagnosis Date  . Diabetes mellitus without complication ([place])   . Diffuse large B-cell lymphoma (HCC)    Chemo + rad tx's  . Dysplastic nevus 03/14/2020   left inf med scapula moderate atypia   . Dysplastic nevus 03/14/2020   left lat breast parallel to areaola, moderate atypia   . Heart murmur   . History of chemotherapy 07/08/2017  . History of gastric ulcer   . History of radiation therapy 07/08/2017  . Hypercholesterolemia   . Hypertension   . ILD (interstitial lung disease) ([place])    8 yrs ago  . Lymphadenopathy     PAST SURGICAL HISTORY :   Past Surgical History:  Procedure Laterality Date  . BREAST BIOPSY Left 2010   neg  . CATARACT EXTRACTION W/PHACO Left 10/16/2017   Procedure: CATARACT EXTRACTION PHACO AND INTRAOCULAR LENS PLACEMENT ([place]) COMPLICATED DIABETIC LEFT;  Surgeon: Leandrew Koyanagi, MD;  Location: [facility];  Service: Ophthalmology;  Laterality: Left;  IRIS HOOKS Diabetic - oral meds  . COLONOSCOPY N/A 06/19/2021   Procedure: COLONOSCOPY;  Surgeon: Annamaria Helling, DO;  Location: [facility] ENDOSCOPY;  Service: Gastroenterology;  Laterality: N/A;  . ESOPHAGOGASTRODUODENOSCOPY N/A 06/19/2021   Procedure: ESOPHAGOGASTRODUODENOSCOPY (EGD);  Surgeon: Annamaria Helling, DO;  Location[facility] ENDOSCOPY;  Service: Gastroenterology;  Laterality: N/A;  . HERNIA REPAIR    . PARTIAL HYSTERECTOMY     age 41  . PERIPHERAL VASCULAR CATHETERIZATION N/A 08/22/2016   Procedure: [name];  Surgeon: [name], MD;  Location: [facility];  Service: Cardiovascular;  Laterality: N/A;  . TONSILLECTOMY      FAMILY HISTORY :   Family History  Problem Relation Age of Onset  . Heart disease Mother   . Heart disease Father   . Heart disease Brother     SOCIAL HISTORY:   Social History   Tobacco Use  . Smoking status: Former    Types: Cigarettes    Quit  date: 08/21/1958    Years since quitting: 63.5  . Smokeless tobacco: Never  Vaping Use  . Vaping Use: Never used  Substance Use Topics  . Alcohol use: No  . Drug use: No    ALLERGIES:  is allergic to levaquin [levofloxacin in d5w], cefuroxime axetil, and doxycycline.  MEDICATIONS:  Current Outpatient Medications  Medication Sig Dispense Refill  . acetaminophen (TYLENOL) 325 MG tablet Take by mouth.    Marland Kitchen b complex vitamins tablet Take 1 tablet by mouth daily.    Marland Kitchen dexamethasone (DECADRON) 4 MG tablet Take 1 tablet (4 mg total) by mouth 2 (two) times daily. For 1 day; prior to infusion. 30 tablet 0  . gabapentin (NEURONTIN) 300 MG capsule Take 1 capsule (300 mg total) by mouth 2 (two) times daily. 60 capsule 3  . hyoscyamine (LEVSIN SL) 0.125 MG SL tablet Place 0.125 mg under the tongue every 4 (four) hours as needed.    Marland Kitchen ibuprofen (ADVIL) 200 MG tablet Take by mouth.    . latanoprost (XALATAN) 0.005 % ophthalmic solution Place 1 drop into both eyes at bedtime.     . Magnesium 400 MG TABS Take 1 tablet by mouth daily.    . metoprolol tartrate (LOPRESSOR) 25 MG tablet TAKE 1 TABLET BY MOUTH TWICE DAILY (Patient taking differently: 12.5 mg 2 (two) times daily. (BETA  BLOCKER)) 180 tablet 0  . pantoprazole (PROTONIX) 40 MG tablet Take 1 tablet by mouth in the morning and at bedtime.    . potassium chloride (KLOR-CON) 10 MEQ tablet Take by mouth.    . potassium chloride (KLOR-CON) 10 MEQ tablet Take 1 tablet by mouth once daily 30 tablet 0  . pravastatin (PRAVACHOL) 40 MG tablet Take 40 mg by mouth daily.    Marland Kitchen sulfamethoxazole-trimethoprim (BACTRIM DS) 800-160 MG tablet Take 1 tablet by mouth 3 (three) times a week.    . valACYclovir (VALTREX) 500 MG tablet Take 1 tablet by mouth daily.    Marland Kitchen amoxicillin-clavulanate (AUGMENTIN) 875-125 MG tablet Take 1 tablet by mouth 2 (two) times daily. (Patient not taking: Reported on 02/21/2022) 10 tablet 0  . montelukast (SINGULAIR) 10 MG tablet Take 1  tablet (10 mg total) by mouth at bedtime. One a day. (Patient not taking: Reported on 01/17/2022) 30 tablet 11  . ondansetron (ZOFRAN) 8 MG tablet Take by mouth. (Patient not taking: Reported on 01/17/2022)    . prochlorperazine (COMPAZINE) 10 MG tablet Take 10 mg by mouth every 6 (six) hours as needed for nausea or vomiting. (Patient not taking: Reported on 01/17/2022)     No current facility-administered medications for this visit.   Facility-Administered Medications Ordered in Other Visits  Medication Dose Route Frequency Provider Last Rate Last Admin  . 0.9 %  sodium chloride infusion   Intravenous Continuous Cammie Sickle, MD 10 mL/hr at 06/19/21 1230 Continued from Pre-op at 06/19/21 1230  . 0.9 %  sodium chloride infusion   Intravenous Continuous Cammie Sickle, MD   Stopped at 12/26/16 1011  . 0.9 %  sodium chloride infusion   Intravenous Continuous Cammie Sickle, MD 10 mL/hr at 12/27/16 0930 New Bag at 12/27/16 0930  . heparin lock flush 100 unit/mL  500 Units Intravenous Once Charlaine Dalton R, MD      . heparin lock flush 100 unit/mL  500 Units Intravenous Once Charlaine Dalton R, MD      . heparin lock flush 100 unit/mL  500 Units Intravenous Once Charlaine Dalton R, MD      . sodium chloride flush (NS) 0.9 % injection 10 mL  10 mL Intravenous PRN Cammie Sickle, MD   10 mL at 11/06/16 1000  . sodium chloride flush (NS) 0.9 % injection 10 mL  10 mL Intravenous PRN Cammie Sickle, MD   10 mL at 11/07/16 0905  . sodium chloride flush (NS) 0.9 % injection 10 mL  10 mL Intravenous PRN Charlaine Dalton R, MD      . sodium chloride flush (NS) 0.9 % injection 10 mL  10 mL Intravenous PRN Cammie Sickle, MD   10 mL at 12/05/16 1030  . sodium chloride flush (NS) 0.9 % injection 10 mL  10 mL Intravenous PRN Cammie Sickle, MD   10 mL at 12/27/16 0930  . sodium chloride flush (NS) 0.9 % injection 10 mL  10 mL Intravenous PRN Cammie Sickle, MD   10 mL at 09/27/20 1305  . sodium chloride flush (NS) 0.9 % injection 10 mL  10 mL Intravenous Once Cammie Sickle, MD        PHYSICAL EXAMINATION: ECOG PERFORMANCE STATUS: 1 - Symptomatic but completely ambulatory  BP (!) 141/70 (BP Location: Left Arm, Patient Position: Sitting)   Pulse 65   Temp 98.5 F (36.9 C) (Tympanic)   Resp 18   Ht '5\' 7"'$  (  1.702 m)   Wt 134 lb 6.4 oz (61 kg)   SpO2 99%   BMI 21.05 kg/m   Filed Weights   02/21/22 1000  Weight: 134 lb 6.4 oz (61 kg)   Tenderness left maxillary sinus area  Physical Exam HENT:     Head: Normocephalic and atraumatic.     Mouth/Throat:     Pharynx: No oropharyngeal exudate.  Eyes:     Pupils: Pupils are equal, round, and reactive to light.  Cardiovascular:     Rate and Rhythm: Normal rate and regular rhythm.  Pulmonary:     Effort: No respiratory distress.     Breath sounds: No wheezing.     Comments: Bilateral crackles at the bases.  No wheeze. Abdominal:     General: Bowel sounds are normal. There is no distension.     Palpations: Abdomen is soft. There is no mass.     Tenderness: There is no abdominal tenderness. There is no guarding or rebound.  Musculoskeletal:        General: No tenderness. Normal range of motion.     Cervical back: Normal range of motion and neck supple.  Skin:    General: Skin is warm.  Neurological:     Mental Status: She is alert and oriented to person, place, and time.  Psychiatric:        Mood and Affect: Affect normal.   LABORATORY DATA:  I have reviewed the data as listed    Component Value Date/Time   NA 141 02/21/2022 0911   K 3.8 02/21/2022 0911   K 4.1 07/01/2014 1429   CL 107 02/21/2022 0911   CO2 25 02/21/2022 0911   GLUCOSE 115 (H) 02/21/2022 0911   BUN 12 02/21/2022 0911   CREATININE 0.53 02/21/2022 0911   CALCIUM 8.6 (L) 02/21/2022 0911   PROT 6.2 (L) 02/21/2022 0911   ALBUMIN 3.6 02/21/2022 0911   AST 21 02/21/2022 0911   ALT 15  02/21/2022 0911   ALKPHOS 49 02/21/2022 0911   BILITOT 0.5 02/21/2022 0911   GFRNONAA >60 02/21/2022 0911   GFRAA >60 03/28/2020 1305    No results found for: "SPEP", "UPEP"  Lab Results  Component Value Date   WBC 3.2 (L) 02/21/2022   NEUTROABS 2.0 02/21/2022   HGB 11.6 (L) 02/21/2022   HCT 34.9 (L) 02/21/2022   MCV 98.0 02/21/2022   PLT 181 02/21/2022      Chemistry      Component Value Date/Time   NA 141 02/21/2022 0911   K 3.8 02/21/2022 0911   K 4.1 07/01/2014 1429   CL 107 02/21/2022 0911   CO2 25 02/21/2022 0911   BUN 12 02/21/2022 0911   CREATININE 0.53 02/21/2022 0911      Component Value Date/Time   CALCIUM 8.6 (L) 02/21/2022 0911   ALKPHOS 49 02/21/2022 0911   AST 21 02/21/2022 0911   ALT 15 02/21/2022 0911   BILITOT 0.5 02/21/2022 0911       RADIOGRAPHIC STUDIES: I have personally reviewed the radiological images as listed and agreed with the findings in the report. No results found.   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 No residual mass.  However. Linear metabolic activity associated the anterior RIGHT lower extremity muscle. Favor muscular trauma  or strain. Lymphoma to isolated muscle less favored. [Hx of falls/trauma; no obvious swelling noted-suspect artifact].  Stable.  #Secondary immunodeficiency -multiple infections [UTI/resipiratory; COVID]-lost 20 pounds-October 2022 [facility] IgG-156.   [trough goal: > 500].  Status post 2 infusions; however infusion was held June/last month because of "infusion reaction"- [body aches; chills; chest pressure; no significant change in vital signs]-received Demerol steroids Pepcid recommend taking dexamethasone 4 mg twice daily day prior; also take  Benadryl 50 mg and also Tylenol 650 night prior.  We will add dexamethasone 10 mg on the day infusion.  Blood glucose post breakfast today was 115.   # Prophylactic antibiotics with Valtrex and Bactrim.  Awaiting  CD4/CD8 count.  # Peripheral neuropathy/left foot drop-STABLE>   # Acute sinusitis-likely secondary to secondary immunodeficiency.  IVIG as above;s/p evaluation with ENT, Dr.vaught.   # ILD- STABLE.   # Mediport-IV access-stable; port flush.   # DISPOSITION: # proceed with IVIG tomorrow.  # follow up in 6 weeks--MD; labs- cbc/cmp/LDH;quantitative immunoglobilun/ CD 4/CD8 count- port-D-2  IVIG infusion;  Dr.B  # I reviewed the blood work- with the patient in detail; also reviewed the imaging independently [as summarized above]; and with the patient in detail.      Orders Placed This Encounter  Procedures  . CBC with Differential/Platelet    Standing Status:   Future    Standing Expiration Date:   02/22/2023  . Comprehensive metabolic panel    Standing Status:   Future    Standing Expiration Date:   02/22/2023  . Lactate dehydrogenase    Standing Status:   Future    Standing Expiration Date:   02/22/2023  . CMV DNA, quantitative, PCR    Standing Status:   Future    Standing Expiration Date:   02/22/2023  . T-helper cells CD4/CD8 %    Standing Status:   Future    Standing Expiration Date:   02/22/2023   All questions were answered. The patient knows to call the clinic with any problems, questions or concerns.      Cammie Sickle, MD 02/21/2022 12:29 PM

## 2022-02-22 ENCOUNTER — Inpatient Hospital Stay: Payer: Medicare PPO

## 2022-02-22 VITALS — BP 132/53 | HR 60 | Temp 97.2°F | Resp 18

## 2022-02-22 DIAGNOSIS — E860 Dehydration: Secondary | ICD-10-CM

## 2022-02-22 DIAGNOSIS — D849 Immunodeficiency, unspecified: Secondary | ICD-10-CM | POA: Diagnosis not present

## 2022-02-22 DIAGNOSIS — C8333 Diffuse large B-cell lymphoma, intra-abdominal lymph nodes: Secondary | ICD-10-CM

## 2022-02-22 LAB — T-HELPER CELLS CD4/CD8 %
% CD 4 Pos. Lymph.: 25.7 % — ABNORMAL LOW (ref 30.8–58.5)
Absolute CD 4 Helper: 77 /uL — ABNORMAL LOW (ref 359–1519)
Basophils Absolute: 0 10*3/uL (ref 0.0–0.2)
Basos: 1 %
CD3+CD4+ Cells/CD3+CD8+ Cells Bld: 0.73 — ABNORMAL LOW (ref 0.92–3.72)
CD3+CD8+ Cells # Bld: 106 /uL — ABNORMAL LOW (ref 109–897)
CD3+CD8+ Cells NFr Bld: 35.4 % (ref 12.0–35.5)
EOS (ABSOLUTE): 0.2 10*3/uL (ref 0.0–0.4)
Eos: 8 %
Hematocrit: 34.5 % (ref 34.0–46.6)
Hemoglobin: 11.6 g/dL (ref 11.1–15.9)
Immature Grans (Abs): 0.1 10*3/uL (ref 0.0–0.1)
Immature Granulocytes: 3 %
Lymphocytes Absolute: 0.3 10*3/uL — ABNORMAL LOW (ref 0.7–3.1)
Lymphs: 11 %
MCH: 32 pg (ref 26.6–33.0)
MCHC: 33.6 g/dL (ref 31.5–35.7)
MCV: 95 fL (ref 79–97)
Monocytes Absolute: 0.4 10*3/uL (ref 0.1–0.9)
Monocytes: 15 %
Neutrophils Absolute: 1.9 10*3/uL (ref 1.4–7.0)
Neutrophils: 62 %
Platelets: 197 10*3/uL (ref 150–450)
RBC: 3.63 x10E6/uL — ABNORMAL LOW (ref 3.77–5.28)
RDW: 13.8 % (ref 11.7–15.4)
WBC: 3 10*3/uL — ABNORMAL LOW (ref 3.4–10.8)

## 2022-02-22 MED ORDER — IMMUNE GLOBULIN (HUMAN) 5 GM/50ML IV SOLN
25.0000 g | Freq: Once | INTRAVENOUS | Status: AC
  Administered 2022-02-22: 25 g via INTRAVENOUS
  Filled 2022-02-22: qty 200

## 2022-02-22 MED ORDER — SODIUM CHLORIDE 0.9% FLUSH
10.0000 mL | Freq: Once | INTRAVENOUS | Status: AC | PRN
  Administered 2022-02-22: 10 mL
  Filled 2022-02-22: qty 10

## 2022-02-22 MED ORDER — DIPHENHYDRAMINE HCL 25 MG PO CAPS
50.0000 mg | ORAL_CAPSULE | Freq: Once | ORAL | Status: AC
  Administered 2022-02-22: 50 mg via ORAL
  Filled 2022-02-22: qty 2

## 2022-02-22 MED ORDER — ACETAMINOPHEN 325 MG PO TABS
650.0000 mg | ORAL_TABLET | Freq: Once | ORAL | Status: AC
  Administered 2022-02-22: 650 mg via ORAL
  Filled 2022-02-22: qty 2

## 2022-02-22 MED ORDER — DEXTROSE 5 % IV SOLN
Freq: Once | INTRAVENOUS | Status: AC
  Filled 2022-02-22: qty 250

## 2022-02-22 MED ORDER — MONTELUKAST SODIUM 10 MG PO TABS
10.0000 mg | ORAL_TABLET | Freq: Once | ORAL | Status: AC
  Administered 2022-02-22: 10 mg via ORAL
  Filled 2022-02-22: qty 1

## 2022-02-22 MED ORDER — HEPARIN SOD (PORK) LOCK FLUSH 100 UNIT/ML IV SOLN
250.0000 [IU] | Freq: Once | INTRAVENOUS | Status: AC | PRN
  Administered 2022-02-22: 500 [IU]
  Filled 2022-02-22: qty 5

## 2022-02-22 MED ORDER — SODIUM CHLORIDE 0.9 % IV SOLN
10.0000 mg | Freq: Once | INTRAVENOUS | Status: AC
  Administered 2022-02-22: 10 mg via INTRAVENOUS
  Filled 2022-02-22: qty 1

## 2022-02-22 NOTE — Patient Instructions (Signed)
[facility] CANCER CTR AT [place]  Discharge Instructions: Thank you for choosing [facility] to provide your oncology and hematology care.  If you have a lab appointment with the [facility], please go directly to the [facility] and check in at the registration area.  Wear comfortable clothing and clothing appropriate for easy access to any Portacath or PICC line.   We strive to give you quality time with your provider. You may need to reschedule your appointment if you arrive late (15 or more minutes).  Arriving late affects you and other patients whose appointments are after yours.  Also, if you miss three or more appointments without notifying the office, you may be dismissed from the clinic at the provider's discretion.      For prescription refill requests, have your pharmacy contact our office and allow 72 hours for refills to be completed.    Today you received the following chemotherapy and/or immunotherapy agents IVIG      To help prevent nausea and vomiting after your treatment, we encourage you to take your nausea medication as directed.  BELOW ARE SYMPTOMS THAT SHOULD BE REPORTED IMMEDIATELY: *FEVER GREATER THAN 100.4 F (38 C) OR HIGHER *CHILLS OR SWEATING *NAUSEA AND VOMITING THAT IS NOT CONTROLLED WITH YOUR NAUSEA MEDICATION *UNUSUAL SHORTNESS OF BREATH *UNUSUAL BRUISING OR BLEEDING *URINARY PROBLEMS (pain or burning when urinating, or frequent urination) *BOWEL PROBLEMS (unusual diarrhea, constipation, pain near the anus) TENDERNESS IN MOUTH AND THROAT WITH OR WITHOUT PRESENCE OF ULCERS (sore throat, sores in mouth, or a toothache) UNUSUAL RASH, SWELLING OR PAIN  UNUSUAL VAGINAL DISCHARGE OR ITCHING   Items with * indicate a potential emergency and should be followed up as soon as possible or go to the Emergency Department if any problems should occur.  Please show the CHEMOTHERAPY ALERT CARD or IMMUNOTHERAPY ALERT CARD at check-in to the  Emergency Department and triage nurse.  Should you have questions after your visit or need to cancel or reschedule your appointment, please contact [facility] CANCER [place] AT [place]  [phone] and follow the prompts.  Office hours are 8:00 a.m. to 4:30 p.m. Monday - Friday. Please note that voicemails left after 4:00 p.m. may not be returned until the following business day.  We are closed weekends and major holidays. You have access to a nurse at all times for urgent questions. Please call the main number to the clinic [phone] and follow the prompts.  For any non-urgent questions, you may also contact your provider using MyChart. We now offer e-Visits for anyone 58 and older to request care online for non-urgent symptoms. For details visit [email or web address].   Also download the MyChart app! Go to the app store, search "MyChart", open the app, select [facility], and log in with your MyChart username and password.  Masks are optional in the cancer centers. If you would like for your care team to wear a mask while they are taking care of you, please let them know. For doctor visits, patients may have with them one support person who is at least 74 years old. At this time, visitors are not allowed in the infusion area.   Immune Globulin Injection What is this medication? IMMUNE GLOBULIN (im MUNE  GLOB yoo lin) helps to prevent or reduce the severity of certain infections in patients who are at risk. This medicine is collected from the pooled blood of many donors. It is used to treat immune system problems, thrombocytopenia, and Kawasaki syndrome. This  medicine may be used for other purposes; ask your health care provider or pharmacist if you have questions. COMMON BRAND NAME(S): ASCENIV, Baygam, BIVIGAM, Carimune, Carimune NF, cutaquig, Cuvitru, Flebogamma, Flebogamma DIF, GamaSTAN, GamaSTAN S/D, Gamimune N, Gammagard, Gammagard S/D, Gammaked, Gammaplex, Gammar-P IV, Gamunex,  Gamunex-C, Hizentra, Iveegam, Iveegam EN, Octagam, Panglobulin, Panglobulin NF, panzyga, Polygam S/D, Privigen, Sandoglobulin, Venoglobulin-S, Vigam, Vivaglobulin, Xembify What should I tell my care team before I take this medication? They need to know if you have any of these conditions: diabetes extremely low or no immune antibodies in the blood heart disease history of blood clots hyperprolinemia infection in the blood, sepsis kidney disease recently received or scheduled to receive a vaccination an unusual or allergic reaction to human immune globulin, albumin, maltose, sucrose, other medicines, foods, dyes, or preservatives pregnant or trying to get pregnant breast-feeding How should I use this medication? This medicine is for injection into a muscle or infusion into a vein or skin. It is usually given by a health care professional in a hospital or clinic setting. In rare cases, some brands of this medicine might be given at home. You will be taught how to give this medicine. Use exactly as directed. Take your medicine at regular intervals. Do not take your medicine more often than directed. Talk to your pediatrician regarding the use of this medicine in children. While this drug may be prescribed for selected conditions, precautions do apply. Overdosage: If you think you have taken too much of this medicine contact a poison control center or emergency room at once. NOTE: This medicine is only for you. Do not share this medicine with others. What if I miss a dose? It is important not to miss your dose. Call your doctor or health care professional if you are unable to keep an appointment. If you give yourself the medicine and you miss a dose, take it as soon as you can. If it is almost time for your next dose, take only that dose. Do not take double or extra doses. What may interact with this medication? aspirin and aspirin-like medicines cisplatin cyclosporine medicines for infection  like acyclovir, adefovir, amphotericin B, bacitracin, cidofovir, foscarnet, ganciclovir, gentamicin, pentamidine, vancomycin NSAIDS, medicines for pain and inflammation, like ibuprofen or naproxen pamidronate vaccines zoledronic acid This list may not describe all possible interactions. Give your health care provider a list of all the medicines, herbs, non-prescription drugs, or dietary supplements you use. Also tell them if you smoke, drink alcohol, or use illegal drugs. Some items may interact with your medicine. What should I watch for while using this medication? Your condition will be monitored carefully while you are receiving this medicine. This medicine is made from pooled blood donations of many different people. It may be possible to pass an infection in this medicine. However, the donors are screened for infections and all products are tested for HIV and hepatitis. The medicine is treated to kill most or all bacteria and viruses. Talk to your doctor about the risks and benefits of this medicine. Do not have vaccinations for at least 14 days before, or until at least 3 months after receiving this medicine. What side effects may I notice from receiving this medication? Side effects that you should report to your doctor or health care professional as soon as possible: allergic reactions like skin rash, itching or hives, swelling of the face, lips, or tongue blue colored lips or skin breathing problems chest pain or tightness fever signs and symptoms of aseptic meningitis  such as stiff neck; sensitivity to light; headache; drowsiness; fever; nausea; vomiting; rash signs and symptoms of a blood clot such as chest pain; shortness of breath; pain, swelling, or warmth in the leg signs and symptoms of hemolytic anemia such as fast heartbeat; tiredness; dark yellow or brown urine; or yellowing of the eyes or skin signs and symptoms of kidney injury like trouble passing urine or change in the  amount of urine sudden weight gain swelling of the ankles, feet, hands Side effects that usually do not require medical attention (report to your doctor or health care professional if they continue or are bothersome): diarrhea flushing headache increased sweating joint pain muscle cramps muscle pain nausea pain, redness, or irritation at site where injected tiredness This list may not describe all possible side effects. Call your doctor for medical advice about side effects. You may report side effects to FDA at 1-800-FDA-1088. Where should I keep my medication? Keep out of the reach of children. This drug is usually given in a hospital or clinic and will not be stored at home. In rare cases, some brands of this medicine may be given at home. If you are using this medicine at home, you will be instructed on how to store this medicine. Throw away any unused medicine after the expiration date on the label. NOTE: This sheet is a summary. It may not cover all possible information. If you have questions about this medicine, talk to your doctor, pharmacist, or health care provider.  2023 Elsevier/Gold Standard (2019-03-11 00:00:00)

## 2022-02-23 LAB — CMV DNA, QUANTITATIVE, PCR
CMV DNA Quant: NEGATIVE IU/mL
Log10 CMV Qn DNA Pl: UNDETERMINED log10 IU/mL

## 2022-02-28 ENCOUNTER — Other Ambulatory Visit: Payer: Self-pay | Admitting: Internal Medicine

## 2022-02-28 NOTE — Telephone Encounter (Signed)
Component Ref Range & Units 7 d ago (02/21/22) 1 mo ago (01/17/22) 3 mo ago (11/07/21) 5 mo ago (09/28/21) 1 yr ago (12/27/20) 1 yr ago (09/27/20) 1 yr ago (06/28/20)  Potassium 3.5 - 5.1 mmol/L 3.8  3.8  3.4 Low   3.8  3.6  3.8  4.0

## 2022-04-02 ENCOUNTER — Other Ambulatory Visit: Payer: Self-pay | Admitting: Internal Medicine

## 2022-04-03 NOTE — Telephone Encounter (Signed)
Component Ref Range & Units 1 mo ago (02/21/22) 2 mo ago (01/17/22) 4 mo ago (11/07/21) 6 mo ago (09/28/21) 1 yr ago (12/27/20) 1 yr ago (09/27/20) 1 yr ago (06/28/20)  Potassium 3.5 - 5.1 mmol/L 3.8  3.8  3.4 Low   3.8  3.6  3.8  4.0

## 2022-04-04 ENCOUNTER — Inpatient Hospital Stay: Payer: Medicare PPO | Attending: Internal Medicine

## 2022-04-04 ENCOUNTER — Inpatient Hospital Stay (HOSPITAL_BASED_OUTPATIENT_CLINIC_OR_DEPARTMENT_OTHER): Payer: Medicare PPO | Admitting: Internal Medicine

## 2022-04-04 ENCOUNTER — Encounter: Payer: Self-pay | Admitting: Internal Medicine

## 2022-04-04 VITALS — BP 116/70 | HR 80 | Temp 99.6°F | Resp 16 | Ht 67.0 in | Wt 135.4 lb

## 2022-04-04 DIAGNOSIS — C8333 Diffuse large B-cell lymphoma, intra-abdominal lymph nodes: Secondary | ICD-10-CM

## 2022-04-04 DIAGNOSIS — Z79899 Other long term (current) drug therapy: Secondary | ICD-10-CM | POA: Insufficient documentation

## 2022-04-04 DIAGNOSIS — Z8572 Personal history of non-Hodgkin lymphomas: Secondary | ICD-10-CM | POA: Insufficient documentation

## 2022-04-04 DIAGNOSIS — Z87891 Personal history of nicotine dependence: Secondary | ICD-10-CM | POA: Insufficient documentation

## 2022-04-04 DIAGNOSIS — Z923 Personal history of irradiation: Secondary | ICD-10-CM | POA: Diagnosis not present

## 2022-04-04 DIAGNOSIS — Z9221 Personal history of antineoplastic chemotherapy: Secondary | ICD-10-CM | POA: Diagnosis not present

## 2022-04-04 DIAGNOSIS — D849 Immunodeficiency, unspecified: Secondary | ICD-10-CM | POA: Diagnosis present

## 2022-04-04 LAB — CBC WITH DIFFERENTIAL/PLATELET
Abs Immature Granulocytes: 0.17 10*3/uL — ABNORMAL HIGH (ref 0.00–0.07)
Basophils Absolute: 0 10*3/uL (ref 0.0–0.1)
Basophils Relative: 1 %
Eosinophils Absolute: 0.2 10*3/uL (ref 0.0–0.5)
Eosinophils Relative: 7 %
HCT: 34.5 % — ABNORMAL LOW (ref 36.0–46.0)
Hemoglobin: 11.4 g/dL — ABNORMAL LOW (ref 12.0–15.0)
Immature Granulocytes: 6 %
Lymphocytes Relative: 12 %
Lymphs Abs: 0.3 10*3/uL — ABNORMAL LOW (ref 0.7–4.0)
MCH: 32.3 pg (ref 26.0–34.0)
MCHC: 33 g/dL (ref 30.0–36.0)
MCV: 97.7 fL (ref 80.0–100.0)
Monocytes Absolute: 0.4 10*3/uL (ref 0.1–1.0)
Monocytes Relative: 15 %
Neutro Abs: 1.6 10*3/uL — ABNORMAL LOW (ref 1.7–7.7)
Neutrophils Relative %: 59 %
Platelets: 163 10*3/uL (ref 150–400)
RBC: 3.53 MIL/uL — ABNORMAL LOW (ref 3.87–5.11)
RDW: 13.4 % (ref 11.5–15.5)
Smear Review: NORMAL
WBC: 2.7 10*3/uL — ABNORMAL LOW (ref 4.0–10.5)
nRBC: 0 % (ref 0.0–0.2)

## 2022-04-04 LAB — LACTATE DEHYDROGENASE: LDH: 213 U/L — ABNORMAL HIGH (ref 98–192)

## 2022-04-04 LAB — COMPREHENSIVE METABOLIC PANEL
ALT: 17 U/L (ref 0–44)
AST: 29 U/L (ref 15–41)
Albumin: 3.7 g/dL (ref 3.5–5.0)
Alkaline Phosphatase: 57 U/L (ref 38–126)
Anion gap: 7 (ref 5–15)
BUN: 18 mg/dL (ref 8–23)
CO2: 26 mmol/L (ref 22–32)
Calcium: 8.6 mg/dL — ABNORMAL LOW (ref 8.9–10.3)
Chloride: 106 mmol/L (ref 98–111)
Creatinine, Ser: 0.62 mg/dL (ref 0.44–1.00)
GFR, Estimated: >60 mL/min (ref >60)
Glucose, Bld: 136 mg/dL — ABNORMAL HIGH (ref 70–99)
Potassium: 3.8 mmol/L (ref 3.5–5.1)
Sodium: 139 mmol/L (ref 135–145)
Total Bilirubin: 0.5 mg/dL (ref 0.3–1.2)
Total Protein: 6.5 g/dL (ref 6.5–8.1)

## 2022-04-04 NOTE — Assessment & Plan Note (Signed)
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 No residual mass.  However. Linear metabolic activity associated the anterior RIGHT lower extremity muscle. Favor muscular trauma or strain. Lymphoma to isolated muscle less favored. [Hx of falls/trauma; no obvious swelling noted-suspect artifact].- STABLE.   # Secondary immunodeficiency -multiple infections [UTI/resipiratory; COVID]-lost 20 pounds-October 2022 [facility] IgG-156.   [trough goal: > 500].  Status post 2 infusions- Proceed with #3 tomorrow.   # Prophylactic antibiotics with Valtrex and Bactrim.  Awaiting  CD4/CD8 count.  # Peripheral neuropathy/left foot drop-STABLE>   # Acute sinusitis-likely secondary to secondary immunodeficiency.  IVIG as above;s/p evaluation with ENT, Dr.vaught.   # ILD- STABLE.   # Mediport-IV access-stable; port flush.   # DISPOSITION: # proceed with IVIG tomorrow.  # follow up in 6 weeks- MD;port-D-2  IVIG infusion;  1 WEEK labs- cbc/cmp/LDH;quantitative immunoglobilun/ CD 4/CD8 count-  Dr.B

## 2022-04-04 NOTE — Progress Notes (Signed)
[place] OFFICE PROGRESS NOTE  Patient Care Team: Rusty Aus, [name] Cammie Sickle, MD as Consulting Physician (Hematology and Oncology) Efrain Sella, MD as Consulting Physician (Gastroenterology)   Cancer Staging  No matching staging information was found for the patient.   Oncology History Overview Note  DEC 2017- DLBCL "TRIPLE HIT [myc/ bcl-2/bcl-6 gene rearrangement FISH]" ~10 cm mass RP LN; STAGE II [jan 2018- BMBx-NEG]; Jan 8th R-[facility];   # JAN 29th 2018- DA-R EPOCH x5 ; PET CR; s/p RT [last 03/18/2017]  # FEB-MARCH 2019- RECURRENCE of DLBCL [s/p RP Bx; 4 cm mass inferior to Left Kidney] ; II [facility]; Dr.Grover.   # April 3rd 2019- R-GDP [carbo]; MAY 2019- PET PR; NOV 2019- PR  #August 2020-recurrence bulky left pelvis/retroperitoneal [9-10 cm]; no biopsy. #August 24th-Rituxan weekly cycle #1; prednisone 60 mg a day.   06/03/2019 - Chemotherapy  BMT OP CAR-T LYMPHODEPLETION FLU/CY + IP AXICABTAGENE CILOLEUCEL (YESCARTA) Fludarabine 30 mg/m2 IV Days 1, 2, 3 Cyclophosphamide 500 mg/m2 IV Days 1, 2, 3 Axicabtagene ciloleucel target dose 2 x 10^6 CAR-positive viable T cells per kg body weight (maximum of 2 x 10^8 CAR-positive viable T cells) on Day 6.  # SEP 2020- 66monthPET-[facility]- significant PR.   #August 2020-2D echo ejection fraction 60 to 65%  --------------------------------  # JAN 26th-LP  # Interstitial Lung disease [surveillance] --------------------------------------------------------------------------  DIAGNOSIS: [[facility]2019 ] RECURRENT DLBCL  STAGE:   Recurrent ; GOALS: CURATIVE  CURRENT/MOST RECENT THERAPY: surveillaince   Diffuse large B-cell lymphoma of intra-abdominal lymph nodes ([place]  04/13/2019 - 04/13/2019 Chemotherapy   The patient had riTUXimab-pvvr (RUXIENCE) 700 mg in sodium chloride 0.9 % 250 mL chemo infusion, , Intravenous, Once, 1 of 4 cycles Administration:  (04/13/2019)   for chemotherapy treatment.    05/07/2019 - 05/07/2019 Chemotherapy   The patient had riTUXimab-hyaluronidase human (RITUXAN HYCELA) 1400-23400 MG -UT/11.7ML injection SQ 1,400 mg, 1,400 mg, Subcutaneous,  Once, 1 of 4 cycles Administration: 1,400 mg (05/07/2019)  for chemotherapy treatment.      INTERVAL HISTORY: Patient is accompanied by husband. she ambulating independently.  [name].[id number].  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  After the initial mild to moderate infusion reactions with IVIG.  Patient has been able to tolerate IV Ig infusions better.  Denies any new lumps or bumps.  No bladder infections.  Feels tired.  Mild diarrhea improved with Imodium.  No night sweats.  No worsening back pain.    Review of Systems  Constitutional:  Positive for malaise/fatigue. Negative for chills, diaphoresis, fever and weight loss.  HENT:  Negative for nosebleeds and sore throat.   Eyes:  Negative for double vision.  Respiratory:  Positive for shortness of breath. Negative for cough, hemoptysis, sputum production and wheezing.   Cardiovascular:  Negative for chest pain, palpitations, orthopnea and leg swelling.  Gastrointestinal:  Negative for abdominal pain, blood in stool, constipation, diarrhea, melena and vomiting.  Genitourinary:  Negative for dysuria, frequency and urgency.  Musculoskeletal:  Positive for joint pain.  Skin: Negative.  Negative for itching and rash.  Neurological:  Positive for tingling. Negative for focal weakness and weakness.  Endo/Heme/Allergies:  Does not bruise/bleed easily.  Psychiatric/Behavioral:  Negative for depression. The patient is not nervous/anxious and does  not have insomnia.     PAST MEDICAL HISTORY :  Past Medical History:  Diagnosis Date    Diabetes mellitus without complication ([place])    Diffuse large B-cell lymphoma ([place])    Chemo + rad tx's   Dysplastic nevus 03/14/2020   left inf med scapula moderate atypia    Dysplastic nevus 03/14/2020   left lat breast parallel to areaola, moderate atypia    Heart murmur    History of chemotherapy 07/08/2017   History of gastric ulcer    History of radiation therapy 07/08/2017   Hypercholesterolemia    Hypertension    ILD (interstitial lung disease) ([place])    8 yrs ago   Lymphadenopathy     PAST SURGICAL HISTORY :   Past Surgical History:  Procedure Laterality Date   BREAST BIOPSY Left 2010   neg   CATARACT EXTRACTION W/PHACO Left 10/16/2017   Procedure: CATARACT EXTRACTION PHACO AND INTRAOCULAR LENS PLACEMENT ([place]) COMPLICATED DIABETIC LEFT;  Surgeon: Leandrew Koyanagi, MD;  Location: [facility];  Service: Ophthalmology;  Laterality: Left;  IRIS HOOKS Diabetic - oral meds   COLONOSCOPY N/A 06/19/2021   Procedure: COLONOSCOPY;  Surgeon: Annamaria Helling, DO;  Location[facility]COPY;  Service: Gastroenterology;  Laterality: N/A;   ESOPHAGOGASTRODUODENOSCOPY N/A 06/19/2021   Procedure: ESOPHAGOGASTRODUODENOSCOPY (EGD);  Surgeon: Annamaria Helling, DO;  Location: [facility] ENDOSCOPY;  Service: Gastroenterology;  Laterality: N/A;   HERNIA REPAIR     PARTIAL HYSTERECTOMY     age 39   PERIPHERAL VASCULAR CATHETERIZATION N/A 08/22/2016   Procedure: [name];  Surgeon: [name], MD;  Location: [place] CV LAB;  Service: Cardiovascular;  Laterality: N/A;   TONSILLECTOMY      FAMILY HISTORY :   Family History  Problem Relation Age of Onset   Heart disease Mother    Heart disease Father    Heart disease Brother     SOCIAL HISTORY:   Social History   Tobacco Use   Smoking status: Former    Types: Cigarettes    Quit date: 08/21/1958    Years since quitting: 63.6   Smokeless tobacco: Never  Vaping Use   Vaping Use: Never used   Substance Use Topics   Alcohol use: No   Drug use: No    ALLERGIES:  is allergic to levaquin [levofloxacin in d5w], cefuroxime axetil, and doxycycline.  MEDICATIONS:  Current Outpatient Medications  Medication Sig Dispense Refill   acetaminophen (TYLENOL) 325 MG tablet Take by mouth.     b complex vitamins tablet Take 1 tablet by mouth daily.     dexamethasone (DECADRON) 4 MG tablet Take 1 tablet (4 mg total) by mouth 2 (two) times daily. For 1 day; prior to infusion. 30 tablet 0   gabapentin (NEURONTIN) 300 MG capsule Take 1 capsule (300 mg total) by mouth 2 (two) times daily. 60 capsule 3   hyoscyamine (LEVSIN SL) 0.125 MG SL tablet Place 0.125 mg under the tongue every 4 (four) hours as needed.     ibuprofen (ADVIL) 200 MG tablet Take by mouth.     latanoprost (XALATAN) 0.005 % ophthalmic solution Place 1 drop into both eyes at bedtime.      metoprolol tartrate (LOPRESSOR) 25 MG tablet TAKE 1 TABLET BY MOUTH TWICE DAILY (Patient taking differently: 12.5 mg 2 (two) times daily. (BETA BLOCKER)) 180 tablet 0   pantoprazole (PROTONIX) 40 MG tablet Take 1 tablet by mouth in the morning and at bedtime.  potassium chloride (KLOR-CON) 10 MEQ tablet Take 1 tablet by mouth once daily 30 tablet 0   pravastatin (PRAVACHOL) 40 MG tablet Take 40 mg by mouth daily.     sulfamethoxazole-trimethoprim (BACTRIM DS) 800-160 MG tablet Take 1 tablet by mouth 3 (three) times a week.     valACYclovir (VALTREX) 500 MG tablet Take 1 tablet by mouth daily.     amoxicillin-clavulanate (AUGMENTIN) 875-125 MG tablet Take 1 tablet by mouth 2 (two) times daily. (Patient not taking: Reported on 02/21/2022) 10 tablet 0   Magnesium 400 MG TABS Take 1 tablet by mouth daily. (Patient not taking: Reported on 04/04/2022)     montelukast (SINGULAIR) 10 MG tablet Take 1 tablet (10 mg total) by mouth at bedtime. One a day. (Patient not taking: Reported on 01/17/2022) 30 tablet 11   ondansetron (ZOFRAN) 8 MG tablet Take by  mouth. (Patient not taking: Reported on 01/17/2022)     potassium chloride (KLOR-CON) 10 MEQ tablet Take by mouth.     prochlorperazine (COMPAZINE) 10 MG tablet Take 10 mg by mouth every 6 (six) hours as needed for nausea or vomiting. (Patient not taking: Reported on 01/17/2022)     No current facility-administered medications for this visit.   Facility-Administered Medications Ordered in Other Visits  Medication Dose Route Frequency Provider Last Rate Last Admin   0.9 %  sodium chloride infusion   Intravenous Continuous Cammie Sickle, MD 10 mL/hr at 06/19/21 1230 Continued from Pre-op at 06/19/21 1230   0.9 %  sodium chloride infusion   Intravenous Continuous Cammie Sickle, MD   Stopped at 12/26/16 1011   0.9 %  sodium chloride infusion   Intravenous Continuous Charlaine Dalton R, MD 10 mL/hr at 12/27/16 0930 New Bag at 12/27/16 0930   heparin lock flush 100 unit/mL  500 Units Intravenous Once Charlaine Dalton R, MD       heparin lock flush 100 unit/mL  500 Units Intravenous Once Charlaine Dalton R, MD       sodium chloride flush (NS) 0.9 % injection 10 mL  10 mL Intravenous PRN Charlaine Dalton R, MD   10 mL at 11/06/16 1000   sodium chloride flush (NS) 0.9 % injection 10 mL  10 mL Intravenous PRN Cammie Sickle, MD   10 mL at 11/07/16 0905   sodium chloride flush (NS) 0.9 % injection 10 mL  10 mL Intravenous PRN Cammie Sickle, MD       sodium chloride flush (NS) 0.9 % injection 10 mL  10 mL Intravenous PRN Charlaine Dalton R, MD   10 mL at 12/05/16 1030   sodium chloride flush (NS) 0.9 % injection 10 mL  10 mL Intravenous PRN Charlaine Dalton R, MD   10 mL at 12/27/16 0930   sodium chloride flush (NS) 0.9 % injection 10 mL  10 mL Intravenous PRN Cammie Sickle, MD   10 mL at 09/27/20 1305    PHYSICAL EXAMINATION: ECOG PERFORMANCE STATUS: 1 - Symptomatic but completely ambulatory  BP 116/70 (BP Location: Left Arm, Patient Position:  Sitting)   Pulse 80   Temp 99.6 F (37.6 C) (Tympanic)   Resp 16   Ht '5\' 7"'$  (1.702 m)   Wt 135 lb 6.4 oz (61.4 kg)   BMI 21.21 kg/m   Filed Weights   04/04/22 1000  Weight: 135 lb 6.4 oz (61.4 kg)   Tenderness left maxillary sinus area  Physical Exam HENT:     Head: Normocephalic and atraumatic.  Mouth/Throat:     Pharynx: No oropharyngeal exudate.  Eyes:     Pupils: Pupils are equal, round, and reactive to light.  Cardiovascular:     Rate and Rhythm: Normal rate and regular rhythm.  Pulmonary:     Effort: No respiratory distress.     Breath sounds: No wheezing.     Comments: Bilateral crackles at the bases.  No wheeze. Abdominal:     General: Bowel sounds are normal. There is no distension.     Palpations: Abdomen is soft. There is no mass.     Tenderness: There is no abdominal tenderness. There is no guarding or rebound.  Musculoskeletal:        General: No tenderness. Normal range of motion.     Cervical back: Normal range of motion and neck supple.  Skin:    General: Skin is warm.  Neurological:     Mental Status: She is alert and oriented to person, place, and time.  Psychiatric:        Mood and Affect: Affect normal.    LABORATORY DATA:  I have reviewed the data as listed    Component Value Date/Time   NA 139 04/04/2022 0912   K 3.8 04/04/2022 0912   K 4.1 07/01/2014 1429   CL 106 04/04/2022 0912   CO2 26 04/04/2022 0912   GLUCOSE 136 (H) 04/04/2022 0912   BUN 18 04/04/2022 0912   CREATININE 0.62 04/04/2022 0912   CALCIUM 8.6 (L) 04/04/2022 0912   PROT 6.5 04/04/2022 0912   ALBUMIN 3.7 04/04/2022 0912   AST 29 04/04/2022 0912   ALT 17 04/04/2022 0912   ALKPHOS 57 04/04/2022 0912   BILITOT 0.5 04/04/2022 0912   GFRNONAA >60 04/04/2022 0912   GFRAA >60 03/28/2020 1305    No results found for: "SPEP", "UPEP"  Lab Results  Component Value Date   WBC 2.7 (L) 04/04/2022   NEUTROABS 1.6 (L) 04/04/2022   HGB 11.4 (L) 04/04/2022   HCT 34.5 (L)  04/04/2022   MCV 97.7 04/04/2022   PLT 163 04/04/2022      Chemistry      Component Value Date/Time   NA 139 04/04/2022 0912   K 3.8 04/04/2022 0912   K 4.1 07/01/2014 1429   CL 106 04/04/2022 0912   CO2 26 04/04/2022 0912   BUN 18 04/04/2022 0912   CREATININE 0.62 04/04/2022 0912      Component Value Date/Time   CALCIUM 8.6 (L) 04/04/2022 0912   ALKPHOS 57 04/04/2022 0912   AST 29 04/04/2022 0912   ALT 17 04/04/2022 0912   BILITOT 0.5 04/04/2022 0912       RADIOGRAPHIC STUDIES: I have personally reviewed the radiological images as listed and agreed with the findings in the report. No results found.   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 No residual mass.  However. Linear metabolic activity associated the anterior RIGHT lower extremity muscle. Favor muscular trauma or strain. Lymphoma to isolated muscle less favored. [Hx of falls/trauma; no obvious swelling noted-suspect artifact].- STABLE.   # Secondary immunodeficiency -multiple infections [UTI/resipiratory; COVID]-lost 20 pounds-October 2022 [facility] IgG-156.   [trough goal: > 500].  Status post 2 infusions- Proceed with #3 tomorrow.   # Prophylactic antibiotics with Valtrex and Bactrim.  Awaiting  CD4/CD8 count.  # Peripheral neuropathy/left foot drop-STABLE>   # Acute sinusitis-likely secondary to secondary immunodeficiency.  IVIG as above;s/p evaluation with ENT, Dr.vaught.   # ILD- STABLE.   # Mediport-IV access-stable; port flush.   # DISPOSITION: # proceed with IVIG tomorrow.  # follow up in 6 weeks- MD;port-D-2  IVIG infusion;  1 WEEK labs- cbc/cmp/LDH;quantitative immunoglobilun/ CD 4/CD8 count-  Dr.B       Orders Placed This Encounter   Procedures   CBC with Differential/Platelet    Standing Status:   Future    Standing Expiration Date:   04/05/2023   Comprehensive metabolic panel    Standing Status:   Future    Standing Expiration Date:   04/05/2023   Lactate dehydrogenase    Standing Status:   Future    Standing Expiration Date:   04/05/2023   Immunoglobulins, QN, A/E/G/M    Standing Status:   Future    Standing Expiration Date:   04/05/2023   T-helper cells CD4/CD8 %    Standing Status:   Future    Standing Expiration Date:   04/05/2023   All questions were answered. The patient knows to call the clinic with any problems, questions or concerns.      Cammie Sickle, MD 04/04/2022 5:20 PM

## 2022-04-04 NOTE — Progress Notes (Signed)
Still having episodes of diarrhea but not as bad.

## 2022-04-05 ENCOUNTER — Inpatient Hospital Stay: Payer: Medicare PPO

## 2022-04-05 VITALS — BP 112/54 | HR 69 | Temp 97.2°F | Resp 16

## 2022-04-05 DIAGNOSIS — E860 Dehydration: Secondary | ICD-10-CM

## 2022-04-05 DIAGNOSIS — D849 Immunodeficiency, unspecified: Secondary | ICD-10-CM | POA: Diagnosis not present

## 2022-04-05 DIAGNOSIS — C8333 Diffuse large B-cell lymphoma, intra-abdominal lymph nodes: Secondary | ICD-10-CM

## 2022-04-05 MED ORDER — DEXTROSE 5 % IV SOLN
Freq: Once | INTRAVENOUS | Status: AC
  Filled 2022-04-05: qty 250

## 2022-04-05 MED ORDER — ACETAMINOPHEN 325 MG PO TABS
650.0000 mg | ORAL_TABLET | Freq: Once | ORAL | Status: AC
  Administered 2022-04-05: 650 mg via ORAL
  Filled 2022-04-05: qty 2

## 2022-04-05 MED ORDER — SODIUM CHLORIDE 0.9 % IV SOLN
10.0000 mg | Freq: Once | INTRAVENOUS | Status: AC
  Administered 2022-04-05: 10 mg via INTRAVENOUS
  Filled 2022-04-05: qty 10

## 2022-04-05 MED ORDER — HEPARIN SOD (PORK) LOCK FLUSH 100 UNIT/ML IV SOLN
500.0000 [IU] | Freq: Once | INTRAVENOUS | Status: AC | PRN
  Administered 2022-04-05: 500 [IU]
  Filled 2022-04-05: qty 5

## 2022-04-05 MED ORDER — IMMUNE GLOBULIN (HUMAN) 5 GM/50ML IV SOLN
25.0000 g | Freq: Once | INTRAVENOUS | Status: AC
  Administered 2022-04-05: 25 g via INTRAVENOUS
  Filled 2022-04-05: qty 200

## 2022-04-05 MED ORDER — DIPHENHYDRAMINE HCL 25 MG PO CAPS
50.0000 mg | ORAL_CAPSULE | Freq: Once | ORAL | Status: AC
  Administered 2022-04-05: 50 mg via ORAL
  Filled 2022-04-05: qty 2

## 2022-04-05 MED ORDER — SODIUM CHLORIDE 0.9% FLUSH
10.0000 mL | Freq: Once | INTRAVENOUS | Status: DC | PRN
  Filled 2022-04-05: qty 10

## 2022-04-05 MED ORDER — MONTELUKAST SODIUM 10 MG PO TABS
10.0000 mg | ORAL_TABLET | Freq: Once | ORAL | Status: AC
  Administered 2022-04-05: 10 mg via ORAL
  Filled 2022-04-05: qty 1

## 2022-04-06 LAB — T-HELPER CELLS CD4/CD8 %
% CD 4 Pos. Lymph.: 42.8 % (ref 30.8–58.5)
Absolute CD 4 Helper: 128 /uL — ABNORMAL LOW (ref 359–1519)
Basophils Absolute: 0 10*3/uL (ref 0.0–0.2)
Basos: 0 %
CD3+CD4+ Cells/CD3+CD8+ Cells Bld: 0.91 — ABNORMAL LOW (ref 0.92–3.72)
CD3+CD8+ Cells # Bld: 141 /uL (ref 109–897)
CD3+CD8+ Cells NFr Bld: 47 % — ABNORMAL HIGH (ref 12.0–35.5)
EOS (ABSOLUTE): 0.2 10*3/uL (ref 0.0–0.4)
Eos: 6 %
Hematocrit: 31.9 % — ABNORMAL LOW (ref 34.0–46.6)
Hemoglobin: 10.7 g/dL — ABNORMAL LOW (ref 11.1–15.9)
Lymphocytes Absolute: 0.3 10*3/uL — ABNORMAL LOW (ref 0.7–3.1)
Lymphs: 11 %
MCH: 32.1 pg (ref 26.6–33.0)
MCHC: 33.5 g/dL (ref 31.5–35.7)
MCV: 96 fL (ref 79–97)
Monocytes Absolute: 0.2 10*3/uL (ref 0.1–0.9)
Monocytes: 8 %
Neutrophils Absolute: 2 10*3/uL (ref 1.4–7.0)
Neutrophils: 75 %
Platelets: 170 10*3/uL (ref 150–450)
RBC: 3.33 x10E6/uL — ABNORMAL LOW (ref 3.77–5.28)
RDW: 13.5 % (ref 11.7–15.4)
WBC: 2.7 10*3/uL — ABNORMAL LOW (ref 3.4–10.8)

## 2022-04-06 LAB — CMV DNA, QUANTITATIVE, PCR
CMV DNA Quant: NEGATIVE IU/mL
Log10 CMV Qn DNA Pl: UNDETERMINED log10 IU/mL

## 2022-05-01 ENCOUNTER — Other Ambulatory Visit: Payer: Self-pay | Admitting: Internal Medicine

## 2022-05-01 NOTE — Telephone Encounter (Signed)
Component Ref Range & Units 3 wk ago (04/04/22) 2 mo ago (02/21/22) 3 mo ago (01/17/22) 5 mo ago (11/07/21) 7 mo ago (09/28/21) 1 yr ago (12/27/20) 1 yr ago (09/27/20)  Potassium 3.5 - 5.1 mmol/L 3.8  3.8  3.8  3.4 Low

## 2022-05-09 ENCOUNTER — Inpatient Hospital Stay: Payer: Medicare PPO | Attending: Internal Medicine

## 2022-05-09 DIAGNOSIS — Z7952 Long term (current) use of systemic steroids: Secondary | ICD-10-CM | POA: Insufficient documentation

## 2022-05-09 DIAGNOSIS — Z79899 Other long term (current) drug therapy: Secondary | ICD-10-CM | POA: Diagnosis not present

## 2022-05-09 DIAGNOSIS — D849 Immunodeficiency, unspecified: Secondary | ICD-10-CM | POA: Insufficient documentation

## 2022-05-09 DIAGNOSIS — Z79624 Long term (current) use of inhibitors of nucleotide synthesis: Secondary | ICD-10-CM | POA: Insufficient documentation

## 2022-05-09 DIAGNOSIS — Z8572 Personal history of non-Hodgkin lymphomas: Secondary | ICD-10-CM | POA: Diagnosis present

## 2022-05-09 DIAGNOSIS — D649 Anemia, unspecified: Secondary | ICD-10-CM | POA: Diagnosis not present

## 2022-05-09 DIAGNOSIS — C8333 Diffuse large B-cell lymphoma, intra-abdominal lymph nodes: Secondary | ICD-10-CM

## 2022-05-09 DIAGNOSIS — Z95828 Presence of other vascular implants and grafts: Secondary | ICD-10-CM

## 2022-05-09 DIAGNOSIS — Z923 Personal history of irradiation: Secondary | ICD-10-CM | POA: Insufficient documentation

## 2022-05-09 LAB — CBC WITH DIFFERENTIAL/PLATELET
Abs Immature Granulocytes: 0.15 10*3/uL — ABNORMAL HIGH (ref 0.00–0.07)
Basophils Absolute: 0 10*3/uL (ref 0.0–0.1)
Basophils Relative: 1 %
Eosinophils Absolute: 0.2 10*3/uL (ref 0.0–0.5)
Eosinophils Relative: 10 %
HCT: 33 % — ABNORMAL LOW (ref 36.0–46.0)
Hemoglobin: 11.1 g/dL — ABNORMAL LOW (ref 12.0–15.0)
Immature Granulocytes: 6 %
Lymphocytes Relative: 14 %
Lymphs Abs: 0.3 10*3/uL — ABNORMAL LOW (ref 0.7–4.0)
MCH: 31.8 pg (ref 26.0–34.0)
MCHC: 33.6 g/dL (ref 30.0–36.0)
MCV: 94.6 fL (ref 80.0–100.0)
Monocytes Absolute: 0.5 10*3/uL (ref 0.1–1.0)
Monocytes Relative: 19 %
Neutro Abs: 1.3 10*3/uL — ABNORMAL LOW (ref 1.7–7.7)
Neutrophils Relative %: 50 %
Platelets: 170 10*3/uL (ref 150–400)
RBC: 3.49 MIL/uL — ABNORMAL LOW (ref 3.87–5.11)
RDW: 12.9 % (ref 11.5–15.5)
Smear Review: NORMAL
WBC: 2.5 10*3/uL — ABNORMAL LOW (ref 4.0–10.5)
nRBC: 0 % (ref 0.0–0.2)

## 2022-05-09 LAB — COMPREHENSIVE METABOLIC PANEL
ALT: 17 U/L (ref 0–44)
AST: 25 U/L (ref 15–41)
Albumin: 3.5 g/dL (ref 3.5–5.0)
Alkaline Phosphatase: 55 U/L (ref 38–126)
Anion gap: 5 (ref 5–15)
BUN: 21 mg/dL (ref 8–23)
CO2: 25 mmol/L (ref 22–32)
Calcium: 8.7 mg/dL — ABNORMAL LOW (ref 8.9–10.3)
Chloride: 109 mmol/L (ref 98–111)
Creatinine, Ser: 0.9 mg/dL (ref 0.44–1.00)
GFR, Estimated: >60 mL/min (ref >60)
Glucose, Bld: 88 mg/dL (ref 70–99)
Potassium: 3.9 mmol/L (ref 3.5–5.1)
Sodium: 139 mmol/L (ref 135–145)
Total Bilirubin: 0.2 mg/dL — ABNORMAL LOW (ref 0.3–1.2)
Total Protein: 6.4 g/dL — ABNORMAL LOW (ref 6.5–8.1)

## 2022-05-09 LAB — LACTATE DEHYDROGENASE: LDH: 194 U/L — ABNORMAL HIGH (ref 98–192)

## 2022-05-09 MED ORDER — HEPARIN SOD (PORK) LOCK FLUSH 100 UNIT/ML IV SOLN
500.0000 [IU] | Freq: Once | INTRAVENOUS | Status: AC
  Administered 2022-05-09: 500 [IU] via INTRAVENOUS
  Filled 2022-05-09: qty 5

## 2022-05-09 MED ORDER — SODIUM CHLORIDE 0.9% FLUSH
10.0000 mL | Freq: Once | INTRAVENOUS | Status: AC
  Administered 2022-05-09: 10 mL via INTRAVENOUS
  Filled 2022-05-09: qty 10

## 2022-05-10 LAB — T-HELPER CELLS CD4/CD8 %
% CD 4 Pos. Lymph.: 32.1 % (ref 30.8–58.5)
Absolute CD 4 Helper: 96 /uL — ABNORMAL LOW (ref 359–1519)
Basophils Absolute: 0 10*3/uL (ref 0.0–0.2)
Basos: 1 %
CD3+CD4+ Cells/CD3+CD8+ Cells Bld: 0.83 — ABNORMAL LOW (ref 0.92–3.72)
CD3+CD8+ Cells # Bld: 116 /uL (ref 109–897)
CD3+CD8+ Cells NFr Bld: 38.8 % — ABNORMAL HIGH (ref 12.0–35.5)
EOS (ABSOLUTE): 0.3 10*3/uL (ref 0.0–0.4)
Eos: 11 %
Hematocrit: 29.5 % — ABNORMAL LOW (ref 34.0–46.6)
Hemoglobin: 9.9 g/dL — ABNORMAL LOW (ref 11.1–15.9)
Immature Grans (Abs): 0.1 10*3/uL (ref 0.0–0.1)
Immature Granulocytes: 4 %
Lymphocytes Absolute: 0.3 10*3/uL — ABNORMAL LOW (ref 0.7–3.1)
Lymphs: 14 %
MCH: 31.6 pg (ref 26.6–33.0)
MCHC: 33.6 g/dL (ref 31.5–35.7)
MCV: 94 fL (ref 79–97)
Monocytes Absolute: 0.5 10*3/uL (ref 0.1–0.9)
Monocytes: 20 %
Neutrophils Absolute: 1.3 10*3/uL — ABNORMAL LOW (ref 1.4–7.0)
Neutrophils: 50 %
Platelets: 180 10*3/uL (ref 150–450)
RBC: 3.13 x10E6/uL — ABNORMAL LOW (ref 3.77–5.28)
RDW: 13.1 % (ref 11.7–15.4)
WBC: 2.5 10*3/uL — ABNORMAL LOW (ref 3.4–10.8)

## 2022-05-11 LAB — IMMUNOGLOBULINS A/E/G/M, SERUM
IgA: 22 mg/dL — ABNORMAL LOW (ref 64–422)
IgE (Immunoglobulin E), Serum: <2 IU/mL — ABNORMAL LOW (ref 6–495)
IgG (Immunoglobin G), Serum: 514 mg/dL — ABNORMAL LOW (ref 586–1602)
IgM (Immunoglobulin M), Srm: 5 mg/dL — ABNORMAL LOW (ref 26–217)

## 2022-05-15 ENCOUNTER — Other Ambulatory Visit: Payer: Self-pay | Admitting: *Deleted

## 2022-05-15 DIAGNOSIS — C8333 Diffuse large B-cell lymphoma, intra-abdominal lymph nodes: Secondary | ICD-10-CM

## 2022-05-16 ENCOUNTER — Inpatient Hospital Stay: Payer: Medicare PPO | Admitting: Internal Medicine

## 2022-05-16 ENCOUNTER — Encounter: Payer: Self-pay | Admitting: Internal Medicine

## 2022-05-16 VITALS — BP 123/70 | HR 72 | Temp 99.4°F | Resp 16 | Ht 67.0 in | Wt 137.6 lb

## 2022-05-16 DIAGNOSIS — D849 Immunodeficiency, unspecified: Secondary | ICD-10-CM | POA: Diagnosis not present

## 2022-05-16 DIAGNOSIS — C8333 Diffuse large B-cell lymphoma, intra-abdominal lymph nodes: Secondary | ICD-10-CM

## 2022-05-16 NOTE — Progress Notes (Signed)
Diarrhea stable and well controlled with OTC meds.  Occasional dyspnea on exertion.  Neuropathy is stable on the Gabapentin.

## 2022-05-16 NOTE — Progress Notes (Signed)
[facility] OFFICE PROGRESS NOTE  Patient Care Team: Rose Aus, [name] Rose Sickle, MD as Consulting Physician (Hematology and Oncology) Rose Sella, MD as Consulting Physician (Gastroenterology)   Cancer Staging  No matching staging information was found for the patient.   Oncology History Overview Note  DEC 2017- DLBCL "TRIPLE HIT [myc/ bcl-2/bcl-6 gene rearrangement FISH]" ~10 cm mass RP LN; STAGE II [jan 2018- BMBx-NEG]; Jan 8th R-[facility];   # JAN 29th 2018- DA-R EPOCH x5 ; PET CR; s/p RT [last 03/18/2017]  # FEB-MARCH 2019- RECURRENCE of DLBCL [s/p RP Bx; 4 cm mass inferior to Left Kidney] ; II [facility]; Dr.Grover.   # April 3rd 2019- R-GDP [carbo]; MAY 2019- PET PR; NOV 2019- PR  #August 2020-recurrence bulky left pelvis/retroperitoneal [9-10 cm]; no biopsy. #August 24th-Rituxan weekly cycle #1; prednisone 60 mg a day.   06/03/2019 - Chemotherapy  BMT OP CAR-T LYMPHODEPLETION FLU/CY + IP AXICABTAGENE CILOLEUCEL (YESCARTA) Fludarabine 30 mg/m2 IV Days 1, 2, 3 Cyclophosphamide 500 mg/m2 IV Days 1, 2, 3 Axicabtagene ciloleucel target dose 2 x 10^6 CAR-positive viable T cells per kg body weight (maximum of 2 x 10^8 CAR-positive viable T cells) on Day 6.  # SEP 2020- 80monthPET-[facility]- significant PR.   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    05/07/2019 - 05/07/2019 Chemotherapy   The patient had riTUXimab-hyaluronidase human (RITUXAN HYCELA) 1400-23400 MG -UT/11.7ML injection SQ 1,400 mg, 1,400 mg, Subcutaneous,  Once, 1 of 4 cycles Administration: 1,400 mg (05/07/2019)  for chemotherapy treatment.      INTERVAL HISTORY: Patient is accompanied by husband. [facility] ambulating independently.  Rose [email or web address].  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  Chronic mild diarrhea not any worse. Occasional dyspnea on exertion. Neuropathy is stable on the Gabapentin.  Patient has not had any further UTIs.  Continues to have intermittent sinus/allergies.  Denies any new lumps or bumps. No night sweats.  No worsening back pain.    Review of Systems  Constitutional:  Positive for malaise/fatigue. Negative for chills, diaphoresis, fever and weight loss.  HENT:  Negative for nosebleeds and sore throat.   Eyes:  Negative for double vision.  Respiratory:  Positive for shortness of breath. Negative for cough, hemoptysis, sputum production and wheezing.   Cardiovascular:  Negative for chest pain, palpitations, orthopnea and leg swelling.  Gastrointestinal:  Negative for abdominal pain, blood in stool, constipation, diarrhea, melena and vomiting.  Genitourinary:  Negative for dysuria, frequency and urgency.  Musculoskeletal:  Positive for joint pain.  Skin: Negative.  Negative for itching and rash.  Neurological:  Positive for tingling. Negative for focal weakness and weakness.  Endo/Heme/Allergies:  Does not bruise/bleed easily.  Psychiatric/Behavioral:  Negative for depression. The patient is not nervous/anxious and does not have insomnia.  PAST MEDICAL HISTORY :  Past Medical History:  Diagnosis Date    Diabetes mellitus without complication ([place])    Diffuse large B-cell lymphoma ([place])    Chemo + rad tx's   Dysplastic nevus 03/14/2020   left inf med scapula moderate atypia    Dysplastic nevus 03/14/2020   left lat breast parallel to areaola, moderate atypia    Heart murmur    History of chemotherapy 07/08/2017   History of gastric ulcer    History of radiation therapy 07/08/2017   Hypercholesterolemia    Hypertension    ILD (interstitial lung disease) ([place])    8 yrs ago   Lymphadenopathy     PAST SURGICAL HISTORY :   Past Surgical History:  Procedure Laterality Date   BREAST BIOPSY Left 2010   neg   CATARACT EXTRACTION W/PHACO Left 10/16/2017   Procedure: CATARACT EXTRACTION PHACO AND INTRAOCULAR LENS PLACEMENT ([place]) COMPLICATED DIABETIC LEFT;  Surgeon: Leandrew Koyanagi, MD;  Location: [place];  Service: Ophthalmology;  Laterality: Left;  IRIS HOOKS Diabetic - oral meds   COLONOSCOPY N/A 06/19/2021   Procedure: COLONOSCOPY;  Surgeon: Annamaria Helling, DO;  [facility] ENDOSCOPY;  Service: Gastroenterology;  Laterality: N/A;   ESOPHAGOGASTRODUODENOSCOPY N/A 06/19/2021   Procedure: ESOPHAGOGASTRODUODENOSCOPY (EGD);  Surgeon: Annamaria Helling, DO;  Location: [facility] ENDOSCOPY;  Service: Gastroenterology;  Laterality: N/A;   HERNIA REPAIR     PARTIAL HYSTERECTOMY     age 27   PERIPHERAL VASCULAR CATHETERIZATION N/A 08/22/2016   Procedure: [name];  Surgeon: [name], MD;  Location: [place] CV LAB;  Service: Cardiovascular;  Laterality: N/A;   TONSILLECTOMY      FAMILY HISTORY :   Family History  Problem Relation Age of Onset   Heart disease Mother    Heart disease Father    Heart disease Brother     SOCIAL HISTORY:   Social History   Tobacco Use   Smoking status: Former    Types: Cigarettes    Quit date: 08/21/1958    Years since quitting: 63.7   Smokeless tobacco: Never  Vaping Use   Vaping Use: Never used   Substance Use Topics   Alcohol use: No   Drug use: No    ALLERGIES:  is allergic to levaquin [levofloxacin in d5w], cefuroxime axetil, and doxycycline.  MEDICATIONS:  Current Outpatient Medications  Medication Sig Dispense Refill   acetaminophen (TYLENOL) 325 MG tablet Take by mouth.     b complex vitamins tablet Take 1 tablet by mouth daily.     dexamethasone (DECADRON) 4 MG tablet Take 1 tablet (4 mg total) by mouth 2 (two) times daily. For 1 day; prior to infusion. 30 tablet 0   gabapentin (NEURONTIN) 300 MG capsule Take 1 capsule (300 mg total) by mouth 2 (two) times daily. 60 capsule 3   hyoscyamine (LEVSIN SL) 0.125 MG SL tablet Place 0.125 mg under the tongue every 4 (four) hours as needed.     ibuprofen (ADVIL) 200 MG tablet Take by mouth.     latanoprost (XALATAN) 0.005 % ophthalmic solution Place 1 drop into both eyes at bedtime.      metoprolol tartrate (LOPRESSOR) 25 MG tablet TAKE 1 TABLET BY MOUTH TWICE DAILY (Patient taking differently: 12.5 mg 2 (two) times daily. (BETA BLOCKER)) 180 tablet 0   pantoprazole (PROTONIX) 40 MG tablet Take 1 tablet by mouth in the morning and at bedtime.     potassium chloride (KLOR-CON) 10 MEQ tablet Take  1 tablet by mouth once daily 30 tablet 0   pravastatin (PRAVACHOL) 40 MG tablet Take 40 mg by mouth daily.     sulfamethoxazole-trimethoprim (BACTRIM DS) 800-160 MG tablet Take 1 tablet by mouth 3 (three) times a week.     valACYclovir (VALTREX) 500 MG tablet Take 1 tablet by mouth daily.     amoxicillin-clavulanate (AUGMENTIN) 875-125 MG tablet Take 1 tablet by mouth 2 (two) times daily. (Patient not taking: Reported on 02/21/2022) 10 tablet 0   Magnesium 400 MG TABS Take 1 tablet by mouth daily. (Patient not taking: Reported on 04/04/2022)     montelukast (SINGULAIR) 10 MG tablet Take 1 tablet (10 mg total) by mouth at bedtime. One a day. (Patient not taking: Reported on 01/17/2022) 30 tablet 11   ondansetron (ZOFRAN) 8 MG tablet Take by  mouth. (Patient not taking: Reported on 01/17/2022)     potassium chloride (KLOR-CON) 10 MEQ tablet Take by mouth.     prochlorperazine (COMPAZINE) 10 MG tablet Take 10 mg by mouth every 6 (six) hours as needed for nausea or vomiting. (Patient not taking: Reported on 01/17/2022)     No current facility-administered medications for this visit.   Facility-Administered Medications Ordered in Other Visits  Medication Dose Route Frequency Provider Last Rate Last Admin   0.9 %  sodium chloride infusion   Intravenous Continuous Charlaine Dalton R, MD 10 mL/hr at 06/19/21 1230 Continued from Pre-op at 06/19/21 1230   0.9 %  sodium chloride infusion   Intravenous Continuous Rose Sickle, MD   Stopped at 12/26/16 1011   0.9 %  sodium chloride infusion   Intravenous Continuous Charlaine Dalton R, MD 10 mL/hr at 12/27/16 0930 New Bag at 12/27/16 0930   heparin lock flush 100 unit/mL  500 Units Intravenous Once Charlaine Dalton R, MD       heparin lock flush 100 unit/mL  500 Units Intravenous Once Charlaine Dalton R, MD       sodium chloride flush (NS) 0.9 % injection 10 mL  10 mL Intravenous PRN Charlaine Dalton R, MD   10 mL at 11/06/16 1000   sodium chloride flush (NS) 0.9 % injection 10 mL  10 mL Intravenous PRN Rose Sickle, MD   10 mL at 11/07/16 0905   sodium chloride flush (NS) 0.9 % injection 10 mL  10 mL Intravenous PRN Rose Sickle, MD       sodium chloride flush (NS) 0.9 % injection 10 mL  10 mL Intravenous PRN Charlaine Dalton R, MD   10 mL at 12/05/16 1030   sodium chloride flush (NS) 0.9 % injection 10 mL  10 mL Intravenous PRN Charlaine Dalton R, MD   10 mL at 12/27/16 0930   sodium chloride flush (NS) 0.9 % injection 10 mL  10 mL Intravenous PRN Rose Sickle, MD   10 mL at 09/27/20 1305    PHYSICAL EXAMINATION: ECOG PERFORMANCE STATUS: 1 - Symptomatic but completely ambulatory  BP 123/70 (BP Location: Left Arm, Patient Position:  Sitting)   Pulse 72   Temp 99.4 F (37.4 C) (Tympanic)   Resp 16   Ht '5\' 7"'$  (1.702 m)   Wt 137 lb 9.6 oz (62.4 kg)   SpO2 98%   BMI 21.55 kg/m   Filed Weights   05/16/22 0900  Weight: 137 lb 9.6 oz (62.4 kg)   Tenderness left maxillary sinus area  Physical Exam HENT:     Head: Normocephalic and atraumatic.  Mouth/Throat:     Pharynx: No oropharyngeal exudate.  Eyes:     Pupils: Pupils are equal, round, and reactive to light.  Cardiovascular:     Rate and Rhythm: Normal rate and regular rhythm.  Pulmonary:     Effort: No respiratory distress.     Breath sounds: No wheezing.     Comments: Bilateral crackles at the bases.  No wheeze. Abdominal:     General: Bowel sounds are normal. There is no distension.     Palpations: Abdomen is soft. There is no mass.     Tenderness: There is no abdominal tenderness. There is no guarding or rebound.  Musculoskeletal:        General: No tenderness. Normal range of motion.     Cervical back: Normal range of motion and neck supple.  Skin:    General: Skin is warm.  Neurological:     Mental Status: [facility] is alert and oriented to person, place, and time.  Psychiatric:        Mood and Affect: Affect normal.    LABORATORY DATA:  I have reviewed the data as listed    Component Value Date/Time   NA 139 05/09/2022 1011   K 3.9 05/09/2022 1011   K 4.1 07/01/2014 1429   CL 109 05/09/2022 1011   CO2 25 05/09/2022 1011   GLUCOSE 88 05/09/2022 1011   BUN 21 05/09/2022 1011   CREATININE 0.90 05/09/2022 1011   CALCIUM 8.7 (L) 05/09/2022 1011   PROT 6.4 (L) 05/09/2022 1011   ALBUMIN 3.5 05/09/2022 1011   AST 25 05/09/2022 1011   ALT 17 05/09/2022 1011   ALKPHOS 55 05/09/2022 1011   BILITOT 0.2 (L) 05/09/2022 1011   GFRNONAA >60 05/09/2022 1011   GFRAA >60 03/28/2020 1305    No results found for: "SPEP", "UPEP"  Lab Results  Component Value Date   WBC 2.5 (L) 05/09/2022   WBC 2.5 (L) 05/09/2022   NEUTROABS 1.3 (L) 05/09/2022    NEUTROABS 1.3 (L) 05/09/2022   HGB 9.9 (L) 05/09/2022   HGB 11.1 (L) 05/09/2022   HCT 29.5 (L) 05/09/2022   HCT 33.0 (L) 05/09/2022   MCV 94 05/09/2022   MCV 94.6 05/09/2022   PLT 180 05/09/2022   PLT 170 05/09/2022      Chemistry      Component Value Date/Time   NA 139 05/09/2022 1011   K 3.9 05/09/2022 1011   K 4.1 07/01/2014 1429   CL 109 05/09/2022 1011   CO2 25 05/09/2022 1011   BUN 21 05/09/2022 1011   CREATININE 0.90 05/09/2022 1011      Component Value Date/Time   CALCIUM 8.7 (L) 05/09/2022 1011   ALKPHOS 55 05/09/2022 1011   AST 25 05/09/2022 1011   ALT 17 05/09/2022 1011   BILITOT 0.2 (L) 05/09/2022 1011       RADIOGRAPHIC STUDIES: I have personally reviewed the radiological images as listed and agreed with the findings in the report. No results found.   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 No residual mass.  However. Linear metabolic activity associated the anterior RIGHT lower extremity muscle. Favor muscular trauma or strain. Lymphoma to isolated muscle less favored. [Hx of falls/trauma; no obvious swelling noted-suspect artifact].- STABLE. Will order surveillance imaging in  approximately 2 months; ordered today.  # Secondary immunodeficiency -multiple infections [UTI/resipiratory; COVID]-lost 20 pounds-October 2022 [facility] IgG-156.  [trough goal: > 500].  Status post 3 infusions; SEP 2023- IVIG- 514- HOLD further IVIG infusion for now. Will repeat labs in 2 months.  # Mild neutropenia/ Anemia-Hb 9.9-likely secondary to CAR-T therapy; no concern for any progressive disease.  Check iron studies;ferritin-; B12; folate at next visit.   # Prophylactic antibiotics with Valtrex and Bactrim [until  CD 4 count > 200]. Repeat  CD4/CD8 count.  # Peripheral neuropathy/left foot drop-STABLE>   # Acute sinusitis-likely secondary to secondary immunodeficiency.  IVIG as above;s/p evaluation with ENT, Dr.vaught.   # ILD- STABLE.   # Mediport-IV access-stable; port flush.   # DISPOSITION: # cancel the IVIG infusion tomorrow.   # follow up 1st or second week of dec 2023- MD;port-D-2  IVIG infusion;  1 WEEK labs- cbc/cmp/LDH;iron studies;ferritin; X51; folic aicd; quantitative immunoglobilun/ CD 4/CD8 count; PET scan Prior--  Dr.B        Orders Placed This Encounter  Procedures   NM PET Image Restage (PS) Skull Base to Thigh (F-18 FDG)    Standing Status:   Future    Standing Expiration Date:   05/17/2023    Order Specific Question:   If indicated for the ordered procedure, I authorize the administration of a radiopharmaceutical per Radiology protocol    Answer:   Yes    Order Specific Question:   Preferred imaging location?    Answer:   [facility]   Iron and TIBC    Standing Status:   Future    Standing Expiration Date:   05/17/2023   Ferritin    Standing Status:   Future    Standing Expiration Date:   05/17/2023   Folate    Standing Status:   Future    Standing Expiration Date:   05/17/2023   Vitamin B12    Standing Status:   Future    Standing Expiration Date:   05/17/2023   All questions were answered. The patient knows to call the clinic with any problems, questions or concerns.      Rose Sickle, MD 05/16/2022 10:22 AM

## 2022-05-16 NOTE — Assessment & Plan Note (Signed)
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 No residual mass.  However. Linear metabolic activity associated the anterior RIGHT lower extremity muscle. Favor muscular trauma or strain. Lymphoma to isolated muscle less favored. [Hx of falls/trauma; no obvious swelling noted-suspect artifact].- STABLE. Will order surveillance imaging in approximately 2 months; ordered today.  # Secondary immunodeficiency -multiple infections [UTI/resipiratory; COVID]-lost 20 pounds-October 2022 [facility] IgG-156.  [trough goal: > 500].  Status post 3 infusions; SEP 2023- IVIG- 514- HOLD further IVIG infusion for now. Will repeat labs in 2 months.  # Mild neutropenia/ Anemia-Hb 9.9-likely secondary to CAR-T therapy; no concern for any progressive disease.  Check iron studies;ferritin-; B12; folate at next visit.   # Prophylactic antibiotics with Valtrex and Bactrim [until CD 4 count > 200]. Repeat  CD4/CD8 count.  # Peripheral neuropathy/left foot drop-STABLE>   # Acute sinusitis-likely secondary to secondary immunodeficiency.  IVIG as above;s/p evaluation with ENT, Dr.vaught.   # ILD- STABLE.   # Mediport-IV access-stable; port flush.   # DISPOSITION: # cancel the IVIG infusion tomorrow.   # follow up 1st or second week of dec 2023- MD;port-D-2  IVIG infusion;  1 WEEK labs- cbc/cmp/LDH;iron studies;ferritin; R71; folic aicd; quantitative immunoglobilun/ CD 4/CD8 count; PET scan Prior--  Dr.B

## 2022-05-17 ENCOUNTER — Inpatient Hospital Stay: Payer: Medicare PPO

## 2022-05-28 ENCOUNTER — Other Ambulatory Visit: Payer: Self-pay | Admitting: Internal Medicine

## 2022-05-28 NOTE — Telephone Encounter (Signed)
Component Ref Range & Units 2 wk ago 1 mo ago 3 mo ago 4 mo ago 6 mo ago 8 mo ago 1 yr ago  Potassium 3.5 - 5.1 mmol/L 3.9  3.8  3.8  3.8  3.4 Low   3.8  3.6

## 2022-05-29 ENCOUNTER — Encounter: Payer: Self-pay | Admitting: Internal Medicine

## 2022-06-11 DIAGNOSIS — Z9481 Bone marrow transplant status: Secondary | ICD-10-CM | POA: Insufficient documentation

## 2022-07-10 ENCOUNTER — Other Ambulatory Visit: Payer: Self-pay | Admitting: *Deleted

## 2022-07-18 ENCOUNTER — Other Ambulatory Visit: Payer: Self-pay | Admitting: *Deleted

## 2022-07-24 ENCOUNTER — Inpatient Hospital Stay: Payer: Medicare PPO | Attending: Internal Medicine

## 2022-07-24 ENCOUNTER — Ambulatory Visit
Admission: RE | Admit: 2022-07-24 | Discharge: 2022-07-24 | Disposition: A | Discharge status: Home / Self Care | Payer: Medicare PPO | Source: Ambulatory Visit | Attending: Internal Medicine | Admitting: Internal Medicine

## 2022-07-24 DIAGNOSIS — C8333 Diffuse large B-cell lymphoma, intra-abdominal lymph nodes: Secondary | ICD-10-CM

## 2022-07-24 DIAGNOSIS — Z79899 Other long term (current) drug therapy: Secondary | ICD-10-CM | POA: Insufficient documentation

## 2022-07-24 DIAGNOSIS — Z79624 Long term (current) use of inhibitors of nucleotide synthesis: Secondary | ICD-10-CM | POA: Insufficient documentation

## 2022-07-24 DIAGNOSIS — Z7952 Long term (current) use of systemic steroids: Secondary | ICD-10-CM | POA: Insufficient documentation

## 2022-07-24 DIAGNOSIS — J984 Other disorders of lung: Secondary | ICD-10-CM | POA: Diagnosis present

## 2022-07-24 DIAGNOSIS — D649 Anemia, unspecified: Secondary | ICD-10-CM | POA: Diagnosis not present

## 2022-07-24 DIAGNOSIS — J849 Interstitial pulmonary disease, unspecified: Secondary | ICD-10-CM | POA: Insufficient documentation

## 2022-07-24 DIAGNOSIS — Z8572 Personal history of non-Hodgkin lymphomas: Secondary | ICD-10-CM | POA: Diagnosis present

## 2022-07-24 DIAGNOSIS — Z87440 Personal history of urinary (tract) infections: Secondary | ICD-10-CM | POA: Insufficient documentation

## 2022-07-24 DIAGNOSIS — Z923 Personal history of irradiation: Secondary | ICD-10-CM | POA: Diagnosis not present

## 2022-07-24 DIAGNOSIS — M21372 Foot drop, left foot: Secondary | ICD-10-CM | POA: Diagnosis not present

## 2022-07-24 DIAGNOSIS — D849 Immunodeficiency, unspecified: Secondary | ICD-10-CM | POA: Insufficient documentation

## 2022-07-24 DIAGNOSIS — Z87891 Personal history of nicotine dependence: Secondary | ICD-10-CM | POA: Insufficient documentation

## 2022-07-24 LAB — COMPREHENSIVE METABOLIC PANEL
ALT: 15 U/L (ref 0–44)
AST: 21 U/L (ref 15–41)
Albumin: 3.8 g/dL (ref 3.5–5.0)
Alkaline Phosphatase: 59 U/L (ref 38–126)
Anion gap: 10 (ref 5–15)
BUN: 15 mg/dL (ref 8–23)
CO2: 25 mmol/L (ref 22–32)
Calcium: 8.9 mg/dL (ref 8.9–10.3)
Chloride: 106 mmol/L (ref 98–111)
Creatinine, Ser: 0.62 mg/dL (ref 0.44–1.00)
GFR, Estimated: >60 mL/min (ref >60)
Glucose, Bld: 112 mg/dL — ABNORMAL HIGH (ref 70–99)
Potassium: 3.5 mmol/L (ref 3.5–5.1)
Sodium: 141 mmol/L (ref 135–145)
Total Bilirubin: 0.5 mg/dL (ref 0.3–1.2)
Total Protein: 6.4 g/dL — ABNORMAL LOW (ref 6.5–8.1)

## 2022-07-24 LAB — CBC WITH DIFFERENTIAL/PLATELET
Abs Immature Granulocytes: 0.18 10*3/uL — ABNORMAL HIGH (ref 0.00–0.07)
Basophils Absolute: 0 10*3/uL (ref 0.0–0.1)
Basophils Relative: 1 %
Eosinophils Absolute: 0.2 10*3/uL (ref 0.0–0.5)
Eosinophils Relative: 8 %
HCT: 36.2 % (ref 36.0–46.0)
Hemoglobin: 11.7 g/dL — ABNORMAL LOW (ref 12.0–15.0)
Immature Granulocytes: 6 %
Lymphocytes Relative: 9 %
Lymphs Abs: 0.3 10*3/uL — ABNORMAL LOW (ref 0.7–4.0)
MCH: 29.2 pg (ref 26.0–34.0)
MCHC: 32.3 g/dL (ref 30.0–36.0)
MCV: 90.3 fL (ref 80.0–100.0)
Monocytes Absolute: 0.4 10*3/uL (ref 0.1–1.0)
Monocytes Relative: 15 %
Neutro Abs: 1.8 10*3/uL (ref 1.7–7.7)
Neutrophils Relative %: 61 %
Platelets: 167 10*3/uL (ref 150–400)
RBC: 4.01 MIL/uL (ref 3.87–5.11)
RDW: 13.4 % (ref 11.5–15.5)
Smear Review: ADEQUATE
WBC: 2.9 10*3/uL — ABNORMAL LOW (ref 4.0–10.5)
nRBC: 0 % (ref 0.0–0.2)

## 2022-07-24 LAB — VITAMIN B12: Vitamin B-12: 370 pg/mL (ref 180–914)

## 2022-07-24 LAB — FERRITIN: Ferritin: 16 ng/mL (ref 11–307)

## 2022-07-24 LAB — LACTATE DEHYDROGENASE: LDH: 217 U/L — ABNORMAL HIGH (ref 98–192)

## 2022-07-24 LAB — IRON AND TIBC
Iron: 73 ug/dL (ref 28–170)
Saturation Ratios: 20 % (ref 10.4–31.8)
TIBC: 367 ug/dL (ref 250–450)
UIBC: 294 ug/dL

## 2022-07-24 LAB — GLUCOSE, CAPILLARY: Glucose-Capillary: 112 mg/dL — ABNORMAL HIGH (ref 70–99)

## 2022-07-24 LAB — FOLATE: Folate: 17.4 ng/mL (ref >5.9)

## 2022-07-24 MED ORDER — FLUDEOXYGLUCOSE F - 18 (FDG) INJECTION
7.1000 | Freq: Once | INTRAVENOUS | Status: AC
  Administered 2022-07-24: 7.76 via INTRAVENOUS

## 2022-07-25 LAB — T-HELPER CELLS CD4/CD8 %
% CD 4 Pos. Lymph.: 31.5 % (ref 30.8–58.5)
Absolute CD 4 Helper: 95 /uL — ABNORMAL LOW (ref 359–1519)
Basophils Absolute: 0 10*3/uL (ref 0.0–0.2)
Basos: 1 %
CD3+CD4+ Cells/CD3+CD8+ Cells Bld: 0.83 — ABNORMAL LOW (ref 0.92–3.72)
CD3+CD8+ Cells # Bld: 114 /uL (ref 109–897)
CD3+CD8+ Cells NFr Bld: 38.1 % — ABNORMAL HIGH (ref 12.0–35.5)
EOS (ABSOLUTE): 0.2 10*3/uL (ref 0.0–0.4)
Eos: 7 %
Hematocrit: 34.7 % (ref 34.0–46.6)
Hemoglobin: 11.5 g/dL (ref 11.1–15.9)
Immature Grans (Abs): 0.2 10*3/uL — ABNORMAL HIGH (ref 0.0–0.1)
Immature Granulocytes: 5 %
Lymphocytes Absolute: 0.3 10*3/uL — ABNORMAL LOW (ref 0.7–3.1)
Lymphs: 9 %
MCH: 30.2 pg (ref 26.6–33.0)
MCHC: 33.1 g/dL (ref 31.5–35.7)
MCV: 91 fL (ref 79–97)
Monocytes Absolute: 0.4 10*3/uL (ref 0.1–0.9)
Monocytes: 15 %
Neutrophils Absolute: 1.8 10*3/uL (ref 1.4–7.0)
Neutrophils: 63 %
Platelets: 192 10*3/uL (ref 150–450)
RBC: 3.81 x10E6/uL (ref 3.77–5.28)
RDW: 13.9 % (ref 11.7–15.4)
WBC: 2.8 10*3/uL — ABNORMAL LOW (ref 3.4–10.8)

## 2022-07-28 LAB — IMMUNOGLOBULINS A/E/G/M, SERUM
IgA: 14 mg/dL — ABNORMAL LOW (ref 64–422)
IgE (Immunoglobulin E), Serum: <2 IU/mL — ABNORMAL LOW (ref 6–495)
IgG (Immunoglobin G), Serum: 260 mg/dL — ABNORMAL LOW (ref 586–1602)
IgM (Immunoglobulin M), Srm: 11 mg/dL — ABNORMAL LOW (ref 26–217)

## 2022-07-30 ENCOUNTER — Other Ambulatory Visit: Payer: Self-pay | Admitting: *Deleted

## 2022-07-30 DIAGNOSIS — C8333 Diffuse large B-cell lymphoma, intra-abdominal lymph nodes: Secondary | ICD-10-CM

## 2022-07-31 ENCOUNTER — Encounter: Payer: Self-pay | Admitting: Internal Medicine

## 2022-07-31 ENCOUNTER — Inpatient Hospital Stay: Payer: Medicare PPO | Admitting: Internal Medicine

## 2022-07-31 ENCOUNTER — Other Ambulatory Visit: Payer: Self-pay

## 2022-07-31 ENCOUNTER — Inpatient Hospital Stay: Payer: Medicare PPO

## 2022-07-31 VITALS — BP 130/83 | HR 93 | Temp 99.2°F | Resp 16 | Ht 67.0 in | Wt 138.7 lb

## 2022-07-31 DIAGNOSIS — C8333 Diffuse large B-cell lymphoma, intra-abdominal lymph nodes: Secondary | ICD-10-CM

## 2022-07-31 DIAGNOSIS — R300 Dysuria: Secondary | ICD-10-CM

## 2022-07-31 DIAGNOSIS — D849 Immunodeficiency, unspecified: Secondary | ICD-10-CM | POA: Diagnosis not present

## 2022-07-31 LAB — URINALYSIS, COMPLETE (UACMP) WITH MICROSCOPIC
Bilirubin Urine: NEGATIVE
Glucose, UA: NEGATIVE mg/dL
Ketones, ur: NEGATIVE mg/dL
Nitrite: NEGATIVE
Protein, ur: 100 mg/dL — AB
RBC / HPF: >50 RBC/hpf — ABNORMAL HIGH (ref 0–5)
Specific Gravity, Urine: 1.015 (ref 1.005–1.030)
WBC, UA: >50 WBC/hpf — ABNORMAL HIGH (ref 0–5)
pH: 5 (ref 5.0–8.0)

## 2022-07-31 MED ORDER — SULFAMETHOXAZOLE-TRIMETHOPRIM 800-160 MG PO TABS: 1.0000 | ORAL_TABLET | ORAL | 3 refills | Status: DC

## 2022-07-31 MED ORDER — POTASSIUM CHLORIDE ER 10 MEQ PO TBCR: 10.0000 meq | EXTENDED_RELEASE_TABLET | Freq: Every day | ORAL | 3 refills | Status: DC

## 2022-07-31 MED ORDER — VALACYCLOVIR HCL 500 MG PO TABS: 500.0000 mg | ORAL_TABLET | Freq: Every day | ORAL | 3 refills | Status: DC

## 2022-07-31 NOTE — Progress Notes (Signed)
[place] OFFICE PROGRESS NOTE  Patient Care Team: Rusty Aus, [name] Cammie Sickle, MD as Consulting Physician (Hematology and Oncology) Efrain Sella, MD as Consulting Physician (Gastroenterology)   Cancer Staging  No matching staging information was found for the patient.   Oncology History Overview Note  DEC 2017- DLBCL "TRIPLE HIT [myc/ bcl-2/bcl-6 gene rearrangement FISH]" ~10 cm mass RP LN; STAGE II [jan 2018- BMBx-NEG]; Jan 8th R-[facility];   # JAN 29th 2018- DA-R EPOCH x5 ; PET CR; s/p RT [last 03/18/2017]  # FEB-MARCH 2019- RECURRENCE of DLBCL [s/p RP Bx; 4 cm mass inferior to Left Kidney] ; II [facility]; Dr.Grover.   # April 3rd 2019- R-GDP [carbo]; MAY 2019- PET PR; NOV 2019- PR  #August 2020-recurrence bulky left pelvis/retroperitoneal [9-10 cm]; no biopsy. #August 24th-Rituxan weekly cycle #1; prednisone 60 mg a day.   06/03/2019 - Chemotherapy  BMT OP CAR-T LYMPHODEPLETION FLU/CY + IP AXICABTAGENE CILOLEUCEL (YESCARTA) Fludarabine 30 mg/m2 IV Days 1, 2, 3 Cyclophosphamide 500 mg/m2 IV Days 1, 2, 3 Axicabtagene ciloleucel target dose 2 x 10^6 CAR-positive viable T cells per kg body weight (maximum of 2 x 10^8 CAR-positive viable T cells) on Day 6.  # SEP 2020- 62monthPET-[facility]- significant PR.   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    05/07/2019 - 05/07/2019 Chemotherapy   The patient had riTUXimab-hyaluronidase human (RITUXAN HYCELA) 1400-23400 MG -UT/11.7ML injection SQ 1,400 mg, 1,400 mg, Subcutaneous,  Once, 1 of 4 cycles Administration: 1,400 mg (05/07/2019)  for chemotherapy treatment.      INTERVAL HISTORY: Patient is accompanied by husband. She ambulating independently.  Rose [id number].  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  Patient reports dysuria for 3 weeks with blood tinge started last night. Not worked up.    Difficulty breathing with productive cough and congestion for 3-4 weeks.  History of ILD.   Diarrhea episodes getting more frequent with occasion relief with OTC med.   Patient out of her prophylactic Bactrim DS, Potassium 10 meq, Valtrex for a couple of  months.    Neuropathy is stable on the Gabapentin.  Denies any new lumps or bumps. No night sweats.  No worsening back pain.    Review of Systems  Constitutional:  Positive for malaise/fatigue. Negative for chills, diaphoresis, fever and weight loss.  HENT:  Negative for nosebleeds and sore throat.   Eyes:  Negative for double vision.  Respiratory:  Positive for shortness of breath. Negative for cough, hemoptysis, sputum production and wheezing.   Cardiovascular:  Negative for chest pain, palpitations, orthopnea and leg swelling.  Gastrointestinal:  Negative for abdominal pain, blood in stool, constipation, diarrhea, melena and vomiting.  Genitourinary:  Negative for dysuria, frequency and urgency.  Musculoskeletal:  Positive for  joint pain.  Skin: Negative.  Negative for itching and rash.  Neurological:  Positive for tingling. Negative for focal weakness and weakness.   Endo/Heme/Allergies:  Does not bruise/bleed easily.  Psychiatric/Behavioral:  Negative for depression. The patient is not nervous/anxious and does not have insomnia.     PAST MEDICAL HISTORY :  Past Medical History:  Diagnosis Date   Diabetes mellitus without complication ([place])    Diffuse large B-cell lymphoma ([place])    Chemo + rad tx's   Dysplastic nevus 03/14/2020   left inf med scapula moderate atypia    Dysplastic nevus 03/14/2020   left lat breast parallel to areaola, moderate atypia    Heart murmur    History of chemotherapy 07/08/2017   History of gastric ulcer    History of radiation therapy 07/08/2017   Hypercholesterolemia    Hypertension    ILD (interstitial lung disease) ([place])    8 yrs ago   Lymphadenopathy     PAST SURGICAL HISTORY :   Past Surgical History:  Procedure Laterality Date   BREAST BIOPSY Left 2010   neg   CATARACT EXTRACTION W/PHACO Left 10/16/2017   Procedure: CATARACT EXTRACTION PHACO AND INTRAOCULAR LENS PLACEMENT ([place]) COMPLICATED DIABETIC LEFT;  Surgeon: Leandrew Koyanagi, MD;  Location: [facility];  Service: Ophthalmology;  Laterality: Left;  IRIS HOOKS Diabetic - oral meds   COLONOSCOPY N/A 06/19/2021   Procedure: COLONOSCOPY;  Surgeon: Annamaria Helling, DO;  Location: [facility] ENDOSCOPY;  Service: Gastroenterology;  Laterality: N/A;   ESOPHAGOGASTRODUODENOSCOPY N/A 06/19/2021   Procedure: ESOPHAGOGASTRODUODENOSCOPY (EGD);  Surgeon: Annamaria Helling, DO;  Location: [facility] ENDOSCOPY;  Service: Gastroenterology;  Laterality: N/A;   HERNIA REPAIR     PARTIAL HYSTERECTOMY     age 87   PERIPHERAL VASCULAR CATHETERIZATION N/A 08/22/2016   Procedure: [name];  Surgeon: [name], MD;  Location: [facility] CV LAB;  Service: Cardiovascular;  Laterality: N/A;   TONSILLECTOMY      FAMILY HISTORY :   Family History  Problem Relation Age of Onset   Heart disease Mother    Heart disease Father    Heart disease  Brother     SOCIAL HISTORY:   Social History   Tobacco Use   Smoking status: Former    Types: Cigarettes    Quit date: 08/21/1958    Years since quitting: 63.9   Smokeless tobacco: Never  Vaping Use   Vaping Use: Never used  Substance Use Topics   Alcohol use: No   Drug use: No    ALLERGIES:  is allergic to levaquin [levofloxacin in d5w], cefuroxime axetil, and doxycycline.  MEDICATIONS:  Current Outpatient Medications  Medication Sig Dispense Refill   b complex vitamins tablet Take 1 tablet by mouth daily.     dexamethasone (DECADRON) 4 MG tablet Take 1 tablet (4 mg total) by mouth 2 (two) times daily. For 1 day; prior to infusion. 30 tablet 0   gabapentin (NEURONTIN) 300 MG capsule Take 1 capsule (300 mg total) by mouth 2 (two) times daily. 60 capsule 3   hyoscyamine (LEVSIN SL) 0.125 MG SL tablet Place 0.125 mg under the tongue every 4 (four) hours as needed.     latanoprost (XALATAN) 0.005 % ophthalmic solution Place 1 drop into both eyes at bedtime.      Magnesium 400 MG TABS Take 1 tablet by mouth daily.     metoprolol tartrate (LOPRESSOR) 25 MG tablet TAKE 1 TABLET BY MOUTH TWICE DAILY (Patient taking differently:  12.5 mg 2 (two) times daily. (BETA BLOCKER)) 180 tablet 0   pantoprazole (PROTONIX) 40 MG tablet Take 1 tablet by mouth in the morning and at bedtime.     pravastatin (PRAVACHOL) 40 MG tablet Take 40 mg by mouth daily.     acetaminophen (TYLENOL) 325 MG tablet Take by mouth. (Patient not taking: Reported on 07/31/2022)     amoxicillin-clavulanate (AUGMENTIN) 875-125 MG tablet Take 1 tablet by mouth 2 (two) times daily. (Patient not taking: Reported on 02/21/2022) 10 tablet 0   ibuprofen (ADVIL) 200 MG tablet Take by mouth. (Patient not taking: Reported on 07/31/2022)     montelukast (SINGULAIR) 10 MG tablet Take 1 tablet (10 mg total) by mouth at bedtime. One a day. (Patient not taking: Reported on 07/31/2022) 30 tablet 11   ondansetron (ZOFRAN) 8 MG tablet Take by  mouth. (Patient not taking: Reported on 01/17/2022)     potassium chloride (KLOR-CON) 10 MEQ tablet Take 1 tablet (10 mEq total) by mouth daily. 30 tablet 3   prochlorperazine (COMPAZINE) 10 MG tablet Take 10 mg by mouth every 6 (six) hours as needed for nausea or vomiting. (Patient not taking: Reported on 01/17/2022)     [START ON 08/01/2022] sulfamethoxazole-trimethoprim (BACTRIM DS) 800-160 MG tablet Take 1 tablet by mouth 3 (three) times a week. 12 tablet 3   valACYclovir (VALTREX) 500 MG tablet Take 1 tablet (500 mg total) by mouth daily. 30 tablet 3   No current facility-administered medications for this visit.   Facility-Administered Medications Ordered in Other Visits  Medication Dose Route Frequency Provider Last Rate Last Admin   0.9 %  sodium chloride infusion   Intravenous Continuous Cammie Sickle, MD 10 mL/hr at 06/19/21 1230 Continued from Pre-op at 06/19/21 1230   0.9 %  sodium chloride infusion   Intravenous Continuous Cammie Sickle, MD   Stopped at 12/26/16 1011   0.9 %  sodium chloride infusion   Intravenous Continuous Charlaine Dalton R, MD 10 mL/hr at 12/27/16 0930 New Bag at 12/27/16 0930   heparin lock flush 100 unit/mL  500 Units Intravenous Once Charlaine Dalton R, MD       heparin lock flush 100 unit/mL  500 Units Intravenous Once Charlaine Dalton R, MD       sodium chloride flush (NS) 0.9 % injection 10 mL  10 mL Intravenous PRN Charlaine Dalton R, MD   10 mL at 11/06/16 1000   sodium chloride flush (NS) 0.9 % injection 10 mL  10 mL Intravenous PRN Cammie Sickle, MD   10 mL at 11/07/16 0905   sodium chloride flush (NS) 0.9 % injection 10 mL  10 mL Intravenous PRN Cammie Sickle, MD       sodium chloride flush (NS) 0.9 % injection 10 mL  10 mL Intravenous PRN Charlaine Dalton R, MD   10 mL at 12/05/16 1030   sodium chloride flush (NS) 0.9 % injection 10 mL  10 mL Intravenous PRN Charlaine Dalton R, MD   10 mL at 12/27/16 0930    sodium chloride flush (NS) 0.9 % injection 10 mL  10 mL Intravenous PRN Cammie Sickle, MD   10 mL at 09/27/20 1305    PHYSICAL EXAMINATION: ECOG PERFORMANCE STATUS: 1 - Symptomatic but completely ambulatory  BP 130/83 (BP Location: Left Arm, Patient Position: Sitting)   Pulse 93   Temp 99.2 F (37.3 C) (Tympanic)   Resp 16   Ht '5\' 7"'$  (1.702 m)   Wt  138 lb 11.2 oz (62.9 kg)   SpO2 99%   BMI 21.72 kg/m   Filed Weights   07/31/22 0900  Weight: 138 lb 11.2 oz (62.9 kg)   Tenderness left maxillary sinus area  Physical Exam HENT:     Head: Normocephalic and atraumatic.     Mouth/Throat:     Pharynx: No oropharyngeal exudate.  Eyes:     Pupils: Pupils are equal, round, and reactive to light.  Cardiovascular:     Rate and Rhythm: Normal rate and regular rhythm.  Pulmonary:     Effort: No respiratory distress.     Breath sounds: No wheezing.     Comments: Bilateral crackles at the bases.  No wheeze. Abdominal:     General: Bowel sounds are normal. There is no distension.     Palpations: Abdomen is soft. There is no mass.     Tenderness: There is no abdominal tenderness. There is no guarding or rebound.  Musculoskeletal:        General: No tenderness. Normal range of motion.     Cervical back: Normal range of motion and neck supple.  Skin:    General: Skin is warm.  Neurological:     Mental Status: She is alert and oriented to person, place, and time.  Psychiatric:        Mood and Affect: Affect normal.    LABORATORY DATA:  I have reviewed the data as listed    Component Value Date/Time   NA 141 07/24/2022 0812   K 3.5 07/24/2022 0812   K 4.1 07/01/2014 1429   CL 106 07/24/2022 0812   CO2 25 07/24/2022 0812   GLUCOSE 112 (H) 07/24/2022 0812   BUN 15 07/24/2022 0812   CREATININE 0.62 07/24/2022 0812   CALCIUM 8.9 07/24/2022 0812   PROT 6.4 (L) 07/24/2022 0812   ALBUMIN 3.8 07/24/2022 0812   AST 21 07/24/2022 0812   ALT 15 07/24/2022 0812   ALKPHOS  59 07/24/2022 0812   BILITOT 0.5 07/24/2022 0812   GFRNONAA >60 07/24/2022 0812   GFRAA >60 03/28/2020 1305    No results found for: "SPEP", "UPEP"  Lab Results  Component Value Date   WBC 2.8 (L) 07/24/2022   WBC 2.9 (L) 07/24/2022   NEUTROABS 1.8 07/24/2022   NEUTROABS 1.8 07/24/2022   HGB 11.5 07/24/2022   HGB 11.7 (L) 07/24/2022   HCT 34.7 07/24/2022   HCT 36.2 07/24/2022   MCV 91 07/24/2022   MCV 90.3 07/24/2022   PLT 192 07/24/2022   PLT 167 07/24/2022      Chemistry      Component Value Date/Time   NA 141 07/24/2022 0812   K 3.5 07/24/2022 0812   K 4.1 07/01/2014 1429   CL 106 07/24/2022 0812   CO2 25 07/24/2022 0812   BUN 15 07/24/2022 0812   CREATININE 0.62 07/24/2022 0812      Component Value Date/Time   CALCIUM 8.9 07/24/2022 0812   ALKPHOS 59 07/24/2022 0812   AST 21 07/24/2022 0812   ALT 15 07/24/2022 0812   BILITOT 0.5 07/24/2022 0812       RADIOGRAPHIC STUDIES: I have personally reviewed the radiological images as listed and agreed with the findings in the report. No results found.   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 PET DEC 2023- Stable PET-CT findings. No recurrent  lymphoma is identified ([place] 1).   # Secondary immunodeficiency -multiple infections [UTI/resipiratory; COVID] s/p IVIG x3-  DEC 2023-IgG-260  [trough goal: >400- 500]. Proceed with IVIG infusion monthly x3. Pre-meds: Continue Dex; Tylenol; benadryl 50 night pror.  Will also proceed with UA; culture given her dysuria.  Also also refilled prophylactic antibiotics.  # Mild neutropenia/ Anemia-Hb 11.8 -likely secondary to CAR-T therapy; no concern for any progressive disease. DEC 2023-ferritin 16 saturation 20-follow-up Venofer.  # Prophylactic antibiotics with Valtrex and Bactrim [until CD 4 count > 200]. Repeat  CD4/CD8  count.- Refilled antibiotics.  See above.  # Peripheral neuropathy/left foot drop-STABLE>   # ILD- STABLE.   # Mediport-IV access-stable; port flush.   #Incidental findings on Imaging  PET ,DEC 2023: Atherosclerosis; interstitial lung disease -I reviewed/discussed/counseled the patient.   # DISPOSITION: # UA and urine culture today # IVIG as planned tomorrow # follow up in 1 month MD; port/ labs- cbc/cmp; LDH--D-2  IVIG infusion;  Dr.B         Orders Placed This Encounter  Procedures   Urine Culture    Standing Status:   Future    Standing Expiration Date:   08/01/2023   Urinalysis, Complete w Microscopic    Standing Status:   Future    Standing Expiration Date:   08/01/2023   All questions were answered. The patient knows to call the clinic with any problems, questions or concerns.      Cammie Sickle, MD 07/31/2022 10:09 AM

## 2022-07-31 NOTE — Progress Notes (Signed)
Patient reports dysuria for 3 weeks with blood tinge started last night.    Difficulty breathing with productive cough and congestion for 3-4 weeks.    Diarrhea episodes getting more frequent with occasion relief with OTC med.  Out of Bacrtrim DS, Potassium 10 meq, Valtrex for a couple of  months.  May have been previously prescribed by [facility] and pharmacy hasn't gotten a response on refill request not sure if she is to continue.

## 2022-07-31 NOTE — Assessment & Plan Note (Addendum)
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 PET DEC 2023- Stable PET-CT findings. No recurrent lymphoma is identified ([place] 1).   # Secondary immunodeficiency -multiple infections [UTI/resipiratory; COVID] s/p IVIG x3-  DEC 2023-IgG-260  [trough goal: >400- 500]. Proceed with IVIG infusion monthly x3. Pre-meds: Continue Dex; Tylenol; benadryl 50 night pror.  Will also proceed with UA; culture given her dysuria.  Also also refilled prophylactic antibiotics.  # Mild neutropenia/ Anemia-Hb 11.8 -likely secondary to CAR-T therapy; no concern for any progressive disease. DEC 2023-ferritin 16 saturation 20-follow-up Venofer.  # Prophylactic antibiotics with Valtrex and Bactrim [until CD 4 count > 200]. Repeat  CD4/CD8 count.- Refilled antibiotics.  See above.  # Peripheral neuropathy/left foot drop-STABLE>   # ILD- STABLE.   # Mediport-IV access-stable; port flush.   #Incidental findings on Imaging  PET ,DEC 2023: Atherosclerosis; interstitial lung disease -I reviewed/discussed/counseled the patient.   # I reviewed the blood work- with the patient in detail; also reviewed the imaging independently [as summarized above]; and with the patient in detail.    # DISPOSITION: # UA and urine culture today # IVIG as planned tomorrow # follow up in 1 month MD; port/ labs- cbc/cmp; LDH--D-2  IVIG infusion;  Dr.B

## 2022-08-01 ENCOUNTER — Inpatient Hospital Stay: Payer: Medicare PPO

## 2022-08-01 ENCOUNTER — Telehealth: Payer: Self-pay | Admitting: Internal Medicine

## 2022-08-01 VITALS — BP 128/57 | HR 62 | Temp 98.2°F | Resp 18

## 2022-08-01 DIAGNOSIS — C8333 Diffuse large B-cell lymphoma, intra-abdominal lymph nodes: Secondary | ICD-10-CM

## 2022-08-01 DIAGNOSIS — D849 Immunodeficiency, unspecified: Secondary | ICD-10-CM | POA: Diagnosis not present

## 2022-08-01 DIAGNOSIS — E860 Dehydration: Secondary | ICD-10-CM

## 2022-08-01 MED ORDER — SODIUM CHLORIDE 0.9% FLUSH
10.0000 mL | Freq: Once | INTRAVENOUS | Status: AC | PRN
  Administered 2022-08-01: 10 mL
  Filled 2022-08-01: qty 10

## 2022-08-01 MED ORDER — ACETAMINOPHEN 325 MG PO TABS
650.0000 mg | ORAL_TABLET | Freq: Once | ORAL | Status: AC
  Administered 2022-08-01: 650 mg via ORAL
  Filled 2022-08-01: qty 2

## 2022-08-01 MED ORDER — HEPARIN SOD (PORK) LOCK FLUSH 100 UNIT/ML IV SOLN
500.0000 [IU] | Freq: Once | INTRAVENOUS | Status: AC | PRN
  Administered 2022-08-01: 500 [IU]
  Filled 2022-08-01: qty 5

## 2022-08-01 MED ORDER — MONTELUKAST SODIUM 10 MG PO TABS
10.0000 mg | ORAL_TABLET | Freq: Once | ORAL | Status: AC
  Administered 2022-08-01: 10 mg via ORAL
  Filled 2022-08-01: qty 1

## 2022-08-01 MED ORDER — SODIUM CHLORIDE 0.9 % IV SOLN
10.0000 mg | Freq: Once | INTRAVENOUS | Status: AC
  Administered 2022-08-01: 10 mg via INTRAVENOUS
  Filled 2022-08-01: qty 10

## 2022-08-01 MED ORDER — ALTEPLASE 2 MG IJ SOLR
2.0000 mg | Freq: Once | INTRAMUSCULAR | Status: DC | PRN
  Filled 2022-08-01: qty 2

## 2022-08-01 MED ORDER — SODIUM CHLORIDE 0.9% FLUSH
3.0000 mL | Freq: Once | INTRAVENOUS | Status: DC | PRN
  Filled 2022-08-01: qty 3

## 2022-08-01 MED ORDER — DEXTROSE 5 % IV SOLN
Freq: Once | INTRAVENOUS | Status: AC
  Filled 2022-08-01: qty 250

## 2022-08-01 MED ORDER — EPINEPHRINE 0.3 MG/0.3ML IJ SOAJ: 0.3000 mg | Freq: Once | INTRAMUSCULAR | Status: DC | PRN

## 2022-08-01 MED ORDER — ALBUTEROL SULFATE (2.5 MG/3ML) 0.083% IN NEBU
2.5000 mg | INHALATION_SOLUTION | Freq: Once | RESPIRATORY_TRACT | Status: DC | PRN
  Filled 2022-08-01: qty 3

## 2022-08-01 MED ORDER — SODIUM CHLORIDE 0.9% FLUSH
10.0000 mL | INTRAVENOUS | Status: DC | PRN
  Filled 2022-08-01: qty 10

## 2022-08-01 MED ORDER — FAMOTIDINE IN NACL 20-0.9 MG/50ML-% IV SOLN: 20.0000 mg | Freq: Once | INTRAVENOUS | Status: DC | PRN

## 2022-08-01 MED ORDER — SODIUM CHLORIDE 0.9 % IV SOLN
Freq: Once | INTRAVENOUS | Status: DC | PRN
  Filled 2022-08-01: qty 250

## 2022-08-01 MED ORDER — HEPARIN SOD (PORK) LOCK FLUSH 100 UNIT/ML IV SOLN
250.0000 [IU] | Freq: Once | INTRAVENOUS | Status: DC | PRN
  Filled 2022-08-01: qty 5

## 2022-08-01 MED ORDER — DIPHENHYDRAMINE HCL 25 MG PO CAPS
50.0000 mg | ORAL_CAPSULE | Freq: Once | ORAL | Status: AC
  Administered 2022-08-01: 50 mg via ORAL
  Filled 2022-08-01: qty 2

## 2022-08-01 MED ORDER — DIPHENHYDRAMINE HCL 50 MG/ML IJ SOLN: 50.0000 mg | Freq: Once | INTRAMUSCULAR | Status: DC | PRN

## 2022-08-01 MED ORDER — IMMUNE GLOBULIN (HUMAN) 5 GM/50ML IV SOLN
25.0000 g | Freq: Once | INTRAVENOUS | Status: AC
  Administered 2022-08-01: 25 g via INTRAVENOUS
  Filled 2022-08-01: qty 200

## 2022-08-01 MED ORDER — METHYLPREDNISOLONE SODIUM SUCC 125 MG IJ SOLR: 125.0000 mg | Freq: Once | INTRAMUSCULAR | Status: DC | PRN

## 2022-08-01 NOTE — Patient Instructions (Signed)

## 2022-08-01 NOTE — Telephone Encounter (Signed)
Informed patient that she has possible bladder infection.  Recommend taking Bactrim twice a day daily [instead of 3 times a week]; await culture results for further recommendations.  Start Valtrex as recommended.  GB

## 2022-08-02 ENCOUNTER — Telehealth: Payer: Self-pay | Admitting: *Deleted

## 2022-08-02 ENCOUNTER — Other Ambulatory Visit: Payer: Self-pay | Admitting: Internal Medicine

## 2022-08-02 ENCOUNTER — Other Ambulatory Visit: Payer: Self-pay | Admitting: *Deleted

## 2022-08-02 LAB — URINE CULTURE: Culture: 80000 — AB

## 2022-08-02 MED ORDER — SULFAMETHOXAZOLE-TRIMETHOPRIM 800-160 MG PO TABS: 1.0000 | ORAL_TABLET | Freq: Two times a day (BID) | ORAL | 0 refills | Status: DC

## 2022-08-02 NOTE — Telephone Encounter (Signed)
Called the patient about her bactrim DS. She was starting to use it for  1 pil on monday, wed and Friday to keep her from getting infection.  But she got UTI yest. And started taking 1 pill twice a day. Dr. Rogue Bussing told her to go ahead and take the bactrim she already has and then take 1 pill bid for 7 days and then go back to mon. Wed, and Friday after the 7 days if bactrim for the UTI.   She understands after she takes the bid twice a day for 7 days and then go back to taking it 1 a day on mon, wed and friday

## 2022-08-07 ENCOUNTER — Other Ambulatory Visit: Payer: Self-pay | Admitting: Internal Medicine

## 2022-08-08 MED ORDER — SULFAMETHOXAZOLE-TRIMETHOPRIM 800-160 MG PO TABS: 1.0000 | ORAL_TABLET | Freq: Two times a day (BID) | ORAL | 0 refills | Status: DC

## 2022-08-19 ENCOUNTER — Other Ambulatory Visit: Payer: Self-pay | Admitting: Internal Medicine

## 2022-08-27 ENCOUNTER — Other Ambulatory Visit: Payer: Self-pay | Admitting: Internal Medicine

## 2022-08-28 ENCOUNTER — Encounter: Payer: Self-pay | Admitting: Internal Medicine

## 2022-08-28 ENCOUNTER — Inpatient Hospital Stay: Payer: Medicare PPO | Attending: Internal Medicine

## 2022-08-28 ENCOUNTER — Inpatient Hospital Stay: Payer: Medicare PPO | Admitting: Internal Medicine

## 2022-08-28 VITALS — Temp 99.3°F | Resp 18 | Wt 138.0 lb

## 2022-08-28 DIAGNOSIS — Z79624 Long term (current) use of inhibitors of nucleotide synthesis: Secondary | ICD-10-CM | POA: Diagnosis not present

## 2022-08-28 DIAGNOSIS — D849 Immunodeficiency, unspecified: Secondary | ICD-10-CM | POA: Insufficient documentation

## 2022-08-28 DIAGNOSIS — Z79899 Other long term (current) drug therapy: Secondary | ICD-10-CM | POA: Insufficient documentation

## 2022-08-28 DIAGNOSIS — J849 Interstitial pulmonary disease, unspecified: Secondary | ICD-10-CM | POA: Diagnosis not present

## 2022-08-28 DIAGNOSIS — Z9221 Personal history of antineoplastic chemotherapy: Secondary | ICD-10-CM | POA: Diagnosis not present

## 2022-08-28 DIAGNOSIS — Z87891 Personal history of nicotine dependence: Secondary | ICD-10-CM | POA: Diagnosis not present

## 2022-08-28 DIAGNOSIS — Z95828 Presence of other vascular implants and grafts: Secondary | ICD-10-CM

## 2022-08-28 DIAGNOSIS — Z923 Personal history of irradiation: Secondary | ICD-10-CM | POA: Insufficient documentation

## 2022-08-28 DIAGNOSIS — G629 Polyneuropathy, unspecified: Secondary | ICD-10-CM | POA: Insufficient documentation

## 2022-08-28 DIAGNOSIS — Z8572 Personal history of non-Hodgkin lymphomas: Secondary | ICD-10-CM | POA: Insufficient documentation

## 2022-08-28 DIAGNOSIS — C8333 Diffuse large B-cell lymphoma, intra-abdominal lymph nodes: Secondary | ICD-10-CM

## 2022-08-28 DIAGNOSIS — Z7952 Long term (current) use of systemic steroids: Secondary | ICD-10-CM | POA: Insufficient documentation

## 2022-08-28 LAB — COMPREHENSIVE METABOLIC PANEL
ALT: 16 U/L (ref 0–44)
AST: 29 U/L (ref 15–41)
Albumin: 3.6 g/dL (ref 3.5–5.0)
Alkaline Phosphatase: 56 U/L (ref 38–126)
Anion gap: 9 (ref 5–15)
BUN: 14 mg/dL (ref 8–23)
CO2: 23 mmol/L (ref 22–32)
Calcium: 8.3 mg/dL — ABNORMAL LOW (ref 8.9–10.3)
Chloride: 107 mmol/L (ref 98–111)
Creatinine, Ser: 0.85 mg/dL (ref 0.44–1.00)
GFR, Estimated: >60 mL/min (ref >60)
Glucose, Bld: 215 mg/dL — ABNORMAL HIGH (ref 70–99)
Potassium: 3.6 mmol/L (ref 3.5–5.1)
Sodium: 139 mmol/L (ref 135–145)
Total Bilirubin: 0.4 mg/dL (ref 0.3–1.2)
Total Protein: 6.1 g/dL — ABNORMAL LOW (ref 6.5–8.1)

## 2022-08-28 LAB — CBC WITH DIFFERENTIAL/PLATELET
Abs Immature Granulocytes: 0.08 10*3/uL — ABNORMAL HIGH (ref 0.00–0.07)
Basophils Absolute: 0 10*3/uL (ref 0.0–0.1)
Basophils Relative: 1 %
Eosinophils Absolute: 0.1 10*3/uL (ref 0.0–0.5)
Eosinophils Relative: 7 %
HCT: 33.4 % — ABNORMAL LOW (ref 36.0–46.0)
Hemoglobin: 11.1 g/dL — ABNORMAL LOW (ref 12.0–15.0)
Immature Granulocytes: 4 %
Lymphocytes Relative: 10 %
Lymphs Abs: 0.2 10*3/uL — ABNORMAL LOW (ref 0.7–4.0)
MCH: 30.2 pg (ref 26.0–34.0)
MCHC: 33.2 g/dL (ref 30.0–36.0)
MCV: 90.8 fL (ref 80.0–100.0)
Monocytes Absolute: 0.2 10*3/uL (ref 0.1–1.0)
Monocytes Relative: 7 %
Neutro Abs: 1.5 10*3/uL — ABNORMAL LOW (ref 1.7–7.7)
Neutrophils Relative %: 71 %
Platelets: 163 10*3/uL (ref 150–400)
RBC: 3.68 MIL/uL — ABNORMAL LOW (ref 3.87–5.11)
RDW: 15.3 % (ref 11.5–15.5)
WBC: 2.2 10*3/uL — ABNORMAL LOW (ref 4.0–10.5)
nRBC: 0 % (ref 0.0–0.2)

## 2022-08-28 LAB — LACTATE DEHYDROGENASE: LDH: 196 U/L — ABNORMAL HIGH (ref 98–192)

## 2022-08-28 MED ORDER — HEPARIN SOD (PORK) LOCK FLUSH 100 UNIT/ML IV SOLN
500.0000 [IU] | Freq: Once | INTRAVENOUS | Status: AC
  Administered 2022-08-28: 500 [IU] via INTRAVENOUS
  Filled 2022-08-28: qty 5

## 2022-08-28 NOTE — Progress Notes (Signed)
[facility] OFFICE PROGRESS NOTE  Patient Care Team: Rusty Aus, [name] Cammie Sickle, MD as Consulting Physician (Hematology and Oncology) Efrain Sella, MD as Consulting Physician (Gastroenterology)   Cancer Staging  No matching staging information was found for the patient.   Oncology History Overview Note  DEC 2017- DLBCL "TRIPLE HIT [myc/ bcl-2/bcl-6 gene rearrangement FISH]" ~10 cm mass RP LN; STAGE II [jan 2018- BMBx-NEG]; Jan 8th R-[facility];   # JAN 29th 2018- DA-R EPOCH x5 ; PET CR; s/p RT [last 03/18/2017]  # FEB-MARCH 2019- RECURRENCE of DLBCL [s/p RP Bx; 4 cm mass inferior to Left Kidney] ; II [facility]; Dr.Grover.   # April 3rd 2019- R-GDP [carbo]; MAY 2019- PET PR; NOV 2019- PR  #August 2020-recurrence bulky left pelvis/retroperitoneal [9-10 cm]; no biopsy. #August 24th-Rituxan weekly cycle #1; prednisone 60 mg a day.   06/03/2019 - Chemotherapy  BMT OP CAR-T LYMPHODEPLETION FLU/CY + IP AXICABTAGENE CILOLEUCEL (YESCARTA) Fludarabine 30 mg/m2 IV Days 1, 2, 3 Cyclophosphamide 500 mg/m2 IV Days 1, 2, 3 Axicabtagene ciloleucel target dose 2 x 10^6 CAR-positive viable T cells per kg body weight (maximum of 2 x 10^8 CAR-positive viable T cells) on Day 6.  # SEP 2020- 51monthPET-[facility]- significant PR.   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    05/07/2019 - 05/07/2019 Chemotherapy   The patient had riTUXimab-hyaluronidase human (RITUXAN HYCELA) 1400-23400 MG -UT/11.7ML injection SQ 1,400 mg, 1,400 mg, Subcutaneous,  Once, 1 of 4 cycles Administration: 1,400 mg (05/07/2019)  for chemotherapy treatment.      INTERVAL HISTORY: Patient is accompanied by husband. She ambulating independently.  [name].[id number].  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  Patient here today for follow up and treatment consideration regarding lymphoma. Patient reports some shortness of breath with exertion, O2 sats 99% on room air in clinic.  This is chronic.  Not any worse.  No worsening cough.  Last visit patient was treated with Bactrim for UTI.  Symptoms resolved.  Chronic diarrhea episodes getting more frequent with occasion relief with OTC med.    Neuropathy is stable on the Gabapentin.  Denies any new lumps or bumps. No night sweats.  No worsening back pain.    Review of Systems  Constitutional:  Positive for malaise/fatigue. Negative for chills, diaphoresis, fever and weight loss.  HENT:  Negative for nosebleeds and sore throat.   Eyes:  Negative for double vision.  Respiratory:  Positive for shortness of breath. Negative for cough, hemoptysis, sputum production and wheezing.   Cardiovascular:  Negative for chest pain, palpitations, orthopnea and leg swelling.  Gastrointestinal:  Negative for abdominal pain, blood in stool, constipation, diarrhea, melena and vomiting.  Genitourinary:  Negative for dysuria, frequency and urgency.  Musculoskeletal:  Positive for joint pain.  Skin:  Negative.  Negative for itching and rash.  Neurological:  Positive for tingling. Negative for focal weakness and weakness.   Endo/Heme/Allergies:  Does not bruise/bleed easily.  Psychiatric/Behavioral:  Negative for depression. The patient is not nervous/anxious and does not have insomnia.     PAST MEDICAL HISTORY :  Past Medical History:  Diagnosis Date   Diabetes mellitus without complication ([place])    Diffuse large B-cell lymphoma ([place])    Chemo + rad tx's   Dysplastic nevus 03/14/2020   left inf med scapula moderate atypia    Dysplastic nevus 03/14/2020   left lat breast parallel to areaola, moderate atypia    Heart murmur    History of chemotherapy 07/08/2017   History of gastric ulcer    History of radiation therapy 07/08/2017   Hypercholesterolemia    Hypertension    ILD (interstitial lung disease) ([place])    8 yrs ago   Lymphadenopathy     PAST SURGICAL HISTORY :   Past Surgical History:  Procedure Laterality Date   BREAST BIOPSY Left 2010   neg   CATARACT EXTRACTION W/PHACO Left 10/16/2017   Procedure: CATARACT EXTRACTION PHACO AND INTRAOCULAR LENS PLACEMENT ([place]) COMPLICATED DIABETIC LEFT;  Surgeon: Leandrew Koyanagi, MD;  Location: [facility];  Service: Ophthalmology;  Laterality: Left;  IRIS HOOKS Diabetic - oral meds   COLONOSCOPY N/A 06/19/2021   Procedure: COLONOSCOPY;  Surgeon: Annamaria Helling, DO;  Location: [facility] ENDOSCOPY;  Service: Gastroenterology;  Laterality: N/A;   ESOPHAGOGASTRODUODENOSCOPY N/A 06/19/2021   Procedure: ESOPHAGOGASTRODUODENOSCOPY (EGD);  Surgeon: Annamaria Helling, DO;  Location: [facility] ENDOSCOPY;  Service: Gastroenterology;  Laterality: N/A;   HERNIA REPAIR     PARTIAL HYSTERECTOMY     age 58   PERIPHERAL VASCULAR CATHETERIZATION N/A 08/22/2016   Procedure: [name];  Surgeon: [name], MD;  Location: [place] [facility] CV LAB;  Service: Cardiovascular;  Laterality: N/A;   TONSILLECTOMY      FAMILY HISTORY :   Family History  Problem Relation Age of Onset   Heart disease Mother    Heart disease Father    Heart disease  Brother     SOCIAL HISTORY:   Social History   Tobacco Use   Smoking status: Former    Types: Cigarettes    Quit date: 08/21/1958    Years since quitting: 64.0   Smokeless tobacco: Never  Vaping Use   Vaping Use: Never used  Substance Use Topics   Alcohol use: No   Drug use: No    ALLERGIES:  is allergic to levaquin [levofloxacin in d5w], cefuroxime axetil, and doxycycline.  MEDICATIONS:  Current Outpatient Medications  Medication Sig Dispense Refill   acetaminophen (TYLENOL) 325 MG tablet Take by mouth.     amoxicillin-clavulanate (AUGMENTIN) 875-125 MG tablet Take 1 tablet by mouth 2 (two) times daily. 10 tablet 0   b complex vitamins tablet Take 1 tablet by mouth daily.     dexamethasone (DECADRON) 4 MG tablet Take 1 tablet (4 mg total) by mouth 2 (two) times daily. For 1 day; prior to infusion. 30 tablet 0   gabapentin (NEURONTIN) 300 MG capsule Take 1 capsule (300 mg total) by mouth 2 (two) times daily. 60 capsule 3   hyoscyamine (LEVSIN SL) 0.125 MG SL tablet Place 0.125 mg under the tongue every 4 (four) hours as needed.     ibuprofen (ADVIL) 200 MG tablet Take by mouth.     latanoprost (XALATAN) 0.005 % ophthalmic solution Place 1 drop into  both eyes at bedtime.      Magnesium 400 MG TABS Take 1 tablet by mouth daily.     metoprolol tartrate (LOPRESSOR) 25 MG tablet TAKE 1 TABLET BY MOUTH TWICE DAILY (Patient taking differently: 12.5 mg 2 (two) times daily. (BETA BLOCKER)) 180 tablet 0   montelukast (SINGULAIR) 10 MG tablet Take 1 tablet (10 mg total) by mouth at bedtime. One a day. 30 tablet 11   ondansetron (ZOFRAN) 8 MG tablet Take by mouth.     pantoprazole (PROTONIX) 40 MG tablet Take 1 tablet by mouth in the morning and at bedtime.     potassium chloride (KLOR-CON) 10 MEQ tablet Take 1 tablet (10 mEq total) by mouth daily. 30 tablet 3   pravastatin (PRAVACHOL) 40 MG tablet Take 40 mg by mouth daily.     prochlorperazine (COMPAZINE) 10 MG tablet Take 10 mg by mouth  every 6 (six) hours as needed for nausea or vomiting.     sulfamethoxazole-trimethoprim (BACTRIM DS) 800-160 MG tablet Take 1 tablet by mouth twice daily 12 tablet 0   valACYclovir (VALTREX) 500 MG tablet Take 1 tablet (500 mg total) by mouth daily. 30 tablet 3   No current facility-administered medications for this visit.   Facility-Administered Medications Ordered in Other Visits  Medication Dose Route Frequency Provider Last Rate Last Admin   0.9 %  sodium chloride infusion   Intravenous Continuous Charlaine Dalton R, MD 10 mL/hr at 06/19/21 1230 Continued from Pre-op at 06/19/21 1230   0.9 %  sodium chloride infusion   Intravenous Continuous Cammie Sickle, MD   Stopped at 12/26/16 1011   0.9 %  sodium chloride infusion   Intravenous Continuous Charlaine Dalton R, MD 10 mL/hr at 12/27/16 0930 New Bag at 12/27/16 0930   heparin lock flush 100 unit/mL  500 Units Intravenous Once Charlaine Dalton R, MD       heparin lock flush 100 unit/mL  500 Units Intravenous Once Charlaine Dalton R, MD       sodium chloride flush (NS) 0.9 % injection 10 mL  10 mL Intravenous PRN Charlaine Dalton R, MD   10 mL at 11/06/16 1000   sodium chloride flush (NS) 0.9 % injection 10 mL  10 mL Intravenous PRN Cammie Sickle, MD   10 mL at 11/07/16 0905   sodium chloride flush (NS) 0.9 % injection 10 mL  10 mL Intravenous PRN Cammie Sickle, MD       sodium chloride flush (NS) 0.9 % injection 10 mL  10 mL Intravenous PRN Charlaine Dalton R, MD   10 mL at 12/05/16 1030   sodium chloride flush (NS) 0.9 % injection 10 mL  10 mL Intravenous PRN Charlaine Dalton R, MD   10 mL at 12/27/16 0930   sodium chloride flush (NS) 0.9 % injection 10 mL  10 mL Intravenous PRN Cammie Sickle, MD   10 mL at 09/27/20 1305    PHYSICAL EXAMINATION: ECOG PERFORMANCE STATUS: 1 - Symptomatic but completely ambulatory  Temp 99.3 F (37.4 C) (Tympanic)   Resp 18   Wt 138 lb (62.6 kg)   BMI  21.61 kg/m   Filed Weights   08/28/22 1016  Weight: 138 lb (62.6 kg)   Tenderness left maxillary sinus area  Physical Exam HENT:     Head: Normocephalic and atraumatic.     Mouth/Throat:     Pharynx: No oropharyngeal exudate.  Eyes:     Pupils: Pupils are equal, round, and reactive  to light.  Cardiovascular:     Rate and Rhythm: Normal rate and regular rhythm.  Pulmonary:     Effort: No respiratory distress.     Breath sounds: No wheezing.     Comments: Bilateral crackles at the bases.  No wheeze. Abdominal:     General: Bowel sounds are normal. There is no distension.     Palpations: Abdomen is soft. There is no mass.     Tenderness: There is no abdominal tenderness. There is no guarding or rebound.  Musculoskeletal:        General: No tenderness. Normal range of motion.     Cervical back: Normal range of motion and neck supple.  Skin:    General: Skin is warm.  Neurological:     Mental Status: She is alert and oriented to person, place, and time.  Psychiatric:        Mood and Affect: Affect normal.    LABORATORY DATA:  I have reviewed the data as listed    Component Value Date/Time   NA 139 08/28/2022 0945   K 3.6 08/28/2022 0945   K 4.1 07/01/2014 1429   CL 107 08/28/2022 0945   CO2 23 08/28/2022 0945   GLUCOSE 215 (H) 08/28/2022 0945   BUN 14 08/28/2022 0945   CREATININE 0.85 08/28/2022 0945   CALCIUM 8.3 (L) 08/28/2022 0945   PROT 6.1 (L) 08/28/2022 0945   ALBUMIN 3.6 08/28/2022 0945   AST 29 08/28/2022 0945   ALT 16 08/28/2022 0945   ALKPHOS 56 08/28/2022 0945   BILITOT 0.4 08/28/2022 0945   GFRNONAA >60 08/28/2022 0945   GFRAA >60 03/28/2020 1305    No results found for: "SPEP", "UPEP"  Lab Results  Component Value Date   WBC 2.2 (L) 08/28/2022   NEUTROABS 1.5 (L) 08/28/2022   HGB 11.1 (L) 08/28/2022   HCT 33.4 (L) 08/28/2022   MCV 90.8 08/28/2022   PLT 163 08/28/2022      Chemistry      Component Value Date/Time   NA 139 08/28/2022  0945   K 3.6 08/28/2022 0945   K 4.1 07/01/2014 1429   CL 107 08/28/2022 0945   CO2 23 08/28/2022 0945   BUN 14 08/28/2022 0945   CREATININE 0.85 08/28/2022 0945      Component Value Date/Time   CALCIUM 8.3 (L) 08/28/2022 0945   ALKPHOS 56 08/28/2022 0945   AST 29 08/28/2022 0945   ALT 16 08/28/2022 0945   BILITOT 0.4 08/28/2022 0945       RADIOGRAPHIC STUDIES: I have personally reviewed the radiological images as listed and agreed with the findings in the report. No results found.   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 PET DEC 2023- Stable PET-CT findings. No recurrent lymphoma is identified [ [place] 1]   # Secondary immunodeficiency -multiple infections [UTI/resipiratory; COVID] s/p IVIG x3-  DEC 2023-IgG-260  [trough goal: >400- 500]. Proceed with IVIG infusion monthly x3. Pre-meds: Continue Dex; Tylenol; benadryl 50 night pror.  Continue prophylactic antibiotics- Bactrim M/W/F; valtrex 500 mg/day.   # Mild neutropenia/ Anemia-Hb 11.8 -likely secondary to CAR-T therapy; no concern for any progressive disease. DEC 2023-ferritin 16 saturation 20- STABLE.   # Prophylactic antibiotics with Valtrex and Bactrim [until CD 4 count > 200]. Repeat  CD4/CD8 count.- Refilled antibiotics.  prophylactic antibiotics- Bactrim M/W/F; valtrex 500 mg/day.   #  Peripheral neuropathy/left foot drop-STABLE>   # ILD- STABLE.   # Mediport-IV access-stable; port flush.   # DISPOSITION: # IVIG as planned tomorrow # follow up in 1 month MD; port/ labs- cbc/cmp; LDH--D-2  IVIG infusion;  Dr.B         Orders Placed This Encounter  Procedures   CBC with Differential/Platelet    Standing Status:   Future    Standing Expiration Date:   08/28/2023   Comprehensive metabolic panel    Standing Status:   Future    Standing Expiration Date:    08/28/2023   Lactate dehydrogenase    Standing Status:   Future    Standing Expiration Date:   08/29/2023   All questions were answered. The patient knows to call the clinic with any problems, questions or concerns.      Cammie Sickle, MD 08/28/2022 1:14 PM

## 2022-08-28 NOTE — Assessment & Plan Note (Addendum)
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 PET DEC 2023- Stable PET-CT findings. No recurrent lymphoma is identified [ [place] 1].  Monitor follow-up.  # Secondary immunodeficiency -multiple infections [UTI/resipiratory; COVID] s/p IVIG x3-  DEC 2023-IgG-260  [trough goal: >400- 500]. Proceed with IVIG infusion monthly x3. Pre-meds: Continue Dex; Tylenol; benadryl 50 night pror.  Continue prophylactic antibiotics- Bactrim M/W/F; valtrex 500 mg/day.   # Mild neutropenia/ Anemia-Hb 11.8 -likely secondary to CAR-T therapy; no concern for any progressive disease. DEC 2023-ferritin 16 saturation 20- STABLE.   # Prophylactic antibiotics with Valtrex and Bactrim [until CD 4 count > 200]. Repeat  CD4/CD8 count.- Refilled antibiotics.  prophylactic antibiotics- Bactrim M/W/F; valtrex 500 mg/day.   # Peripheral neuropathy/left foot drop-STABLE>   # ILD- STABLE.   # Mediport-IV access-stable; port flush.   # DISPOSITION: # IVIG as planned tomorrow # follow up in 1 month MD; port/ labs- cbc/cmp; LDH--D-2  IVIG infusion;  Dr.B

## 2022-08-28 NOTE — Progress Notes (Signed)
Patient here today for follow up and treatment consideration regarding lymphoma. Patient reports some shortness of breath with exertion, O2 sats 99% on room air in clinic. Patient denies other concerns today.

## 2022-08-29 ENCOUNTER — Inpatient Hospital Stay: Payer: Medicare PPO

## 2022-08-29 VITALS — BP 133/79 | HR 66 | Temp 97.6°F | Resp 18

## 2022-08-29 DIAGNOSIS — C8333 Diffuse large B-cell lymphoma, intra-abdominal lymph nodes: Secondary | ICD-10-CM

## 2022-08-29 DIAGNOSIS — D849 Immunodeficiency, unspecified: Secondary | ICD-10-CM | POA: Diagnosis not present

## 2022-08-29 DIAGNOSIS — E860 Dehydration: Secondary | ICD-10-CM

## 2022-08-29 LAB — T-HELPER CELLS CD4/CD8 %
% CD 4 Pos. Lymph.: 17.4 % — ABNORMAL LOW (ref 30.8–58.5)
Absolute CD 4 Helper: 35 /uL — ABNORMAL LOW (ref 359–1519)
Basophils Absolute: 0 10*3/uL (ref 0.0–0.2)
Basos: 1 %
CD3+CD4+ Cells/CD3+CD8+ Cells Bld: 0.67 — ABNORMAL LOW (ref 0.92–3.72)
CD3+CD8+ Cells # Bld: 52 /uL — ABNORMAL LOW (ref 109–897)
CD3+CD8+ Cells NFr Bld: 26.1 % (ref 12.0–35.5)
EOS (ABSOLUTE): 0.1 10*3/uL (ref 0.0–0.4)
Eos: 6 %
Hematocrit: 33.5 % — ABNORMAL LOW (ref 34.0–46.6)
Hemoglobin: 11.1 g/dL (ref 11.1–15.9)
Immature Grans (Abs): 0 10*3/uL (ref 0.0–0.1)
Immature Granulocytes: 1 %
Lymphocytes Absolute: 0.2 10*3/uL — ABNORMAL LOW (ref 0.7–3.1)
Lymphs: 10 %
MCH: 30.2 pg (ref 26.6–33.0)
MCHC: 33.1 g/dL (ref 31.5–35.7)
MCV: 91 fL (ref 79–97)
Monocytes Absolute: 0.1 10*3/uL (ref 0.1–0.9)
Monocytes: 6 %
Neutrophils Absolute: 1.6 10*3/uL (ref 1.4–7.0)
Neutrophils: 76 %
Platelets: 169 10*3/uL (ref 150–450)
RBC: 3.68 x10E6/uL — ABNORMAL LOW (ref 3.77–5.28)
RDW: 14.6 % (ref 11.7–15.4)
WBC: 2.1 10*3/uL — ABNORMAL LOW (ref 3.4–10.8)

## 2022-08-29 MED ORDER — SODIUM CHLORIDE 0.9 % IV SOLN
10.0000 mg | Freq: Once | INTRAVENOUS | Status: AC
  Administered 2022-08-29: 10 mg via INTRAVENOUS
  Filled 2022-08-29: qty 10

## 2022-08-29 MED ORDER — MONTELUKAST SODIUM 10 MG PO TABS
10.0000 mg | ORAL_TABLET | Freq: Once | ORAL | Status: AC
  Administered 2022-08-29: 10 mg via ORAL
  Filled 2022-08-29: qty 1

## 2022-08-29 MED ORDER — HEPARIN SOD (PORK) LOCK FLUSH 100 UNIT/ML IV SOLN
500.0000 [IU] | Freq: Once | INTRAVENOUS | Status: AC | PRN
  Administered 2022-08-29: 500 [IU]
  Filled 2022-08-29: qty 5

## 2022-08-29 MED ORDER — DIPHENHYDRAMINE HCL 25 MG PO CAPS
50.0000 mg | ORAL_CAPSULE | Freq: Once | ORAL | Status: AC
  Administered 2022-08-29: 50 mg via ORAL
  Filled 2022-08-29: qty 2

## 2022-08-29 MED ORDER — SODIUM CHLORIDE 0.9% FLUSH
10.0000 mL | Freq: Once | INTRAVENOUS | Status: AC | PRN
  Administered 2022-08-29: 10 mL
  Filled 2022-08-29: qty 10

## 2022-08-29 MED ORDER — ACETAMINOPHEN 325 MG PO TABS
650.0000 mg | ORAL_TABLET | Freq: Once | ORAL | Status: AC
  Administered 2022-08-29: 650 mg via ORAL
  Filled 2022-08-29: qty 2

## 2022-08-29 MED ORDER — IMMUNE GLOBULIN (HUMAN) 5 GM/50ML IV SOLN
25.0000 g | Freq: Once | INTRAVENOUS | Status: AC
  Administered 2022-08-29: 25 g via INTRAVENOUS
  Filled 2022-08-29: qty 200

## 2022-08-29 MED ORDER — DEXTROSE 5 % IV SOLN
Freq: Once | INTRAVENOUS | Status: AC
  Filled 2022-08-29: qty 250

## 2022-08-29 NOTE — Patient Instructions (Signed)

## 2022-08-30 LAB — IMMUNOGLOBULINS A/E/G/M, SERUM
IgA: 17 mg/dL — ABNORMAL LOW (ref 64–422)
IgE (Immunoglobulin E), Serum: <2 IU/mL — ABNORMAL LOW (ref 6–495)
IgG (Immunoglobin G), Serum: 466 mg/dL — ABNORMAL LOW (ref 586–1602)
IgM (Immunoglobulin M), Srm: 7 mg/dL — ABNORMAL LOW (ref 26–217)

## 2022-09-27 ENCOUNTER — Inpatient Hospital Stay: Payer: Medicare PPO | Attending: Internal Medicine

## 2022-09-27 ENCOUNTER — Inpatient Hospital Stay: Payer: Medicare PPO | Admitting: Internal Medicine

## 2022-09-27 ENCOUNTER — Encounter: Payer: Self-pay | Admitting: Internal Medicine

## 2022-09-27 DIAGNOSIS — C8333 Diffuse large B-cell lymphoma, intra-abdominal lymph nodes: Secondary | ICD-10-CM

## 2022-09-27 DIAGNOSIS — D849 Immunodeficiency, unspecified: Secondary | ICD-10-CM | POA: Insufficient documentation

## 2022-09-27 DIAGNOSIS — Z79624 Long term (current) use of inhibitors of nucleotide synthesis: Secondary | ICD-10-CM | POA: Diagnosis not present

## 2022-09-27 DIAGNOSIS — Z8572 Personal history of non-Hodgkin lymphomas: Secondary | ICD-10-CM | POA: Diagnosis present

## 2022-09-27 DIAGNOSIS — I10 Essential (primary) hypertension: Secondary | ICD-10-CM | POA: Diagnosis not present

## 2022-09-27 DIAGNOSIS — R0602 Shortness of breath: Secondary | ICD-10-CM | POA: Insufficient documentation

## 2022-09-27 DIAGNOSIS — Z79899 Other long term (current) drug therapy: Secondary | ICD-10-CM | POA: Insufficient documentation

## 2022-09-27 DIAGNOSIS — G629 Polyneuropathy, unspecified: Secondary | ICD-10-CM | POA: Insufficient documentation

## 2022-09-27 DIAGNOSIS — J849 Interstitial pulmonary disease, unspecified: Secondary | ICD-10-CM | POA: Diagnosis not present

## 2022-09-27 DIAGNOSIS — Z87891 Personal history of nicotine dependence: Secondary | ICD-10-CM | POA: Insufficient documentation

## 2022-09-27 DIAGNOSIS — E119 Type 2 diabetes mellitus without complications: Secondary | ICD-10-CM | POA: Insufficient documentation

## 2022-09-27 DIAGNOSIS — Z923 Personal history of irradiation: Secondary | ICD-10-CM | POA: Insufficient documentation

## 2022-09-27 DIAGNOSIS — D649 Anemia, unspecified: Secondary | ICD-10-CM | POA: Diagnosis not present

## 2022-09-27 LAB — COMPREHENSIVE METABOLIC PANEL
ALT: 17 U/L (ref 0–44)
AST: 29 U/L (ref 15–41)
Albumin: 3.7 g/dL (ref 3.5–5.0)
Alkaline Phosphatase: 53 U/L (ref 38–126)
Anion gap: 9 (ref 5–15)
BUN: 17 mg/dL (ref 8–23)
CO2: 24 mmol/L (ref 22–32)
Calcium: 9 mg/dL (ref 8.9–10.3)
Chloride: 106 mmol/L (ref 98–111)
Creatinine, Ser: 0.82 mg/dL (ref 0.44–1.00)
GFR, Estimated: >60 mL/min (ref >60)
Glucose, Bld: 107 mg/dL — ABNORMAL HIGH (ref 70–99)
Potassium: 3.8 mmol/L (ref 3.5–5.1)
Sodium: 139 mmol/L (ref 135–145)
Total Bilirubin: 0.4 mg/dL (ref 0.3–1.2)
Total Protein: 6.6 g/dL (ref 6.5–8.1)

## 2022-09-27 LAB — CBC WITH DIFFERENTIAL/PLATELET
Abs Immature Granulocytes: 0.12 10*3/uL — ABNORMAL HIGH (ref 0.00–0.07)
Basophils Absolute: 0 10*3/uL (ref 0.0–0.1)
Basophils Relative: 1 %
Eosinophils Absolute: 0.3 10*3/uL (ref 0.0–0.5)
Eosinophils Relative: 11 %
HCT: 33.4 % — ABNORMAL LOW (ref 36.0–46.0)
Hemoglobin: 10.8 g/dL — ABNORMAL LOW (ref 12.0–15.0)
Immature Granulocytes: 4 %
Lymphocytes Relative: 14 %
Lymphs Abs: 0.4 10*3/uL — ABNORMAL LOW (ref 0.7–4.0)
MCH: 29.7 pg (ref 26.0–34.0)
MCHC: 32.3 g/dL (ref 30.0–36.0)
MCV: 91.8 fL (ref 80.0–100.0)
Monocytes Absolute: 0.5 10*3/uL (ref 0.1–1.0)
Monocytes Relative: 18 %
Neutro Abs: 1.5 10*3/uL — ABNORMAL LOW (ref 1.7–7.7)
Neutrophils Relative %: 52 %
Platelets: 169 10*3/uL (ref 150–400)
RBC: 3.64 MIL/uL — ABNORMAL LOW (ref 3.87–5.11)
RDW: 15.2 % (ref 11.5–15.5)
WBC: 2.9 10*3/uL — ABNORMAL LOW (ref 4.0–10.5)
nRBC: 0 % (ref 0.0–0.2)

## 2022-09-27 LAB — LACTATE DEHYDROGENASE: LDH: 197 U/L — ABNORMAL HIGH (ref 98–192)

## 2022-09-27 MED ORDER — HEPARIN SOD (PORK) LOCK FLUSH 100 UNIT/ML IV SOLN
500.0000 [IU] | Freq: Once | INTRAVENOUS | Status: AC
  Administered 2022-09-27: 500 [IU] via INTRAVENOUS
  Filled 2022-09-27: qty 5

## 2022-09-27 MED ORDER — SODIUM CHLORIDE 0.9% FLUSH
10.0000 mL | INTRAVENOUS | Status: DC | PRN
  Administered 2022-09-27: 10 mL via INTRAVENOUS
  Filled 2022-09-27: qty 10

## 2022-09-27 NOTE — Patient Instructions (Signed)

## 2022-09-27 NOTE — Progress Notes (Signed)
[place] OFFICE PROGRESS NOTE  Patient Care Team: Rusty Aus, [name] Cammie Sickle, MD as Consulting Physician (Hematology and Oncology) Efrain Sella, MD as Consulting Physician (Gastroenterology)   Cancer Staging  No matching staging information was found for the patient.   Oncology History Overview Note  DEC 2017- DLBCL "TRIPLE HIT [myc/ bcl-2/bcl-6 gene rearrangement FISH]" ~10 cm mass RP LN; STAGE II [jan 2018- BMBx-NEG]; Jan 8th R-[facility];   # JAN 29th 2018- DA-R EPOCH x5 ; PET CR; s/p RT [last 03/18/2017]  # FEB-MARCH 2019- RECURRENCE of DLBCL [s/p RP Bx; 4 cm mass inferior to Left Kidney] ; II [facility]; Dr.Grover.   # April 3rd 2019- R-GDP [carbo]; MAY 2019- PET PR; NOV 2019- PR  #August 2020-recurrence bulky left pelvis/retroperitoneal [9-10 cm]; no biopsy. #August 24th-Rituxan weekly cycle #1; prednisone 60 mg a day.   06/03/2019 - Chemotherapy  BMT OP CAR-T LYMPHODEPLETION FLU/CY + IP AXICABTAGENE CILOLEUCEL (YESCARTA) Fludarabine 30 mg/m2 IV Days 1, 2, 3 Cyclophosphamide 500 mg/m2 IV Days 1, 2, 3 Axicabtagene ciloleucel target dose 2 x 10^6 CAR-positive viable T cells per kg body weight (maximum of 2 x 10^8 CAR-positive viable T cells) on Day 6.  # SEP 2020- 75monthPET-[facility]- significant PR.   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    05/07/2019 - 05/07/2019 Chemotherapy   The patient had riTUXimab-hyaluronidase human (RITUXAN HYCELA) 1400-23400 MG -UT/11.7ML injection SQ 1,400 mg, 1,400 mg, Subcutaneous,  Once, 1 of 4 cycles Administration: 1,400 mg (05/07/2019)  for chemotherapy treatment.      INTERVAL HISTORY: Patient is accompanied by husband. She ambulating independently.  Rose [id number].[id number].   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  Patient denied any further infections UTIs or pneumonias.  No hospitalizations.  Pt here for Treatment. Has chronic diarrhea; relief with OTC med.  Appetite is getting better. Does feel tired a lot. She denies any obvious side effects from her treatment.   Patient reports some shortness of breath with exertion, This is chronic.  Not any worse.  No worsening cough.  Neuropathy is stable on the Gabapentin.  Denies any new lumps or bumps. No night sweats.  No worsening back pain.    Review of Systems  Constitutional:  Positive for malaise/fatigue. Negative for chills, diaphoresis, fever and weight loss.  HENT:  Negative for nosebleeds and sore throat.   Eyes:  Negative for double vision.  Respiratory:  Positive for shortness of breath. Negative for cough, hemoptysis, sputum production and wheezing.   Cardiovascular:  Negative for chest pain, palpitations, orthopnea and leg swelling.  Gastrointestinal:  Negative for abdominal pain, blood in stool, constipation, diarrhea, melena and vomiting.  Genitourinary:  Negative for dysuria, frequency and urgency.  Musculoskeletal:  Positive for joint pain.  Skin: Negative.  Negative for  itching and rash.  Neurological:  Positive for tingling. Negative for focal weakness and weakness.  Endo/Heme/Allergies:   Does not bruise/bleed easily.  Psychiatric/Behavioral:  Negative for depression. The patient is not nervous/anxious and does not have insomnia.     PAST MEDICAL HISTORY :  Past Medical History:  Diagnosis Date   Diabetes mellitus without complication ([place])    Diffuse large B-cell lymphoma ([place])    Chemo + rad tx's   Dysplastic nevus 03/14/2020   left inf med scapula moderate atypia    Dysplastic nevus 03/14/2020   left lat breast parallel to areaola, moderate atypia    Heart murmur    History of chemotherapy 07/08/2017   History of gastric ulcer    History of radiation therapy 07/08/2017   Hypercholesterolemia    Hypertension    ILD (interstitial lung disease) ([place])    8 yrs ago   Lymphadenopathy     PAST SURGICAL HISTORY :   Past Surgical History:  Procedure Laterality Date   BREAST BIOPSY Left 2010   neg   CATARACT EXTRACTION W/PHACO Left 10/16/2017   Procedure: CATARACT EXTRACTION PHACO AND INTRAOCULAR LENS PLACEMENT ([place]) COMPLICATED DIABETIC LEFT;  Surgeon: Leandrew Koyanagi, MD;  Location: [place];  Service: Ophthalmology;  Laterality: Left;  IRIS HOOKS Diabetic - oral meds   COLONOSCOPY N/A 06/19/2021   Procedure: COLONOSCOPY;  Surgeon: Annamaria Helling, DO;  Location: [facility] ENDOSCOPY;  Service: Gastroenterology;  Laterality: N/A;   ESOPHAGOGASTRODUODENOSCOPY N/A 06/19/2021   Procedure: ESOPHAGOGASTRODUODENOSCOPY (EGD);  Surgeon: Annamaria Helling, DO;  [facility] ENDOSCOPY;  Service: Gastroenterology;  Laterality: N/A;   HERNIA REPAIR     PARTIAL HYSTERECTOMY     age 77   PERIPHERAL VASCULAR CATHETERIZATION N/A 08/22/2016   Procedure: [name];  Surgeon: [name], MD;  Location: [facility];  Service: Cardiovascular;  Laterality: N/A;   TONSILLECTOMY      FAMILY HISTORY :   Family History  Problem Relation Age of Onset   Heart disease Mother    Heart disease Father    Heart disease Brother     SOCIAL  HISTORY:   Social History   Tobacco Use   Smoking status: Former    Types: Cigarettes    Quit date: 08/21/1958    Years since quitting: 64.1   Smokeless tobacco: Never  Vaping Use   Vaping Use: Never used  Substance Use Topics   Alcohol use: No   Drug use: No    ALLERGIES:  is allergic to levaquin [levofloxacin in d5w], cefuroxime axetil, and doxycycline.  MEDICATIONS:  Current Outpatient Medications  Medication Sig Dispense Refill   acetaminophen (TYLENOL) 325 MG tablet Take by mouth.     b complex vitamins tablet Take 1 tablet by mouth daily.     dexamethasone (DECADRON) 4 MG tablet Take 1 tablet (4 mg total) by mouth 2 (two) times daily. For 1 day; prior to infusion. 30 tablet 0   gabapentin (NEURONTIN) 300 MG capsule Take 1 capsule (300 mg total) by mouth 2 (two) times daily. 60 capsule 3   hyoscyamine (LEVSIN SL) 0.125 MG SL tablet Place 0.125 mg under the tongue every 4 (four) hours as needed.     ibuprofen (ADVIL) 200 MG tablet Take by mouth.     latanoprost (XALATAN) 0.005 % ophthalmic solution Place 1 drop into both eyes at bedtime.      Magnesium 400 MG TABS Take 1 tablet by mouth daily.  metoprolol tartrate (LOPRESSOR) 25 MG tablet TAKE 1 TABLET BY MOUTH TWICE DAILY (Patient taking differently: 12.5 mg 2 (two) times daily. (BETA BLOCKER)) 180 tablet 0   montelukast (SINGULAIR) 10 MG tablet Take 1 tablet (10 mg total) by mouth at bedtime. One a day. 30 tablet 11   ondansetron (ZOFRAN) 8 MG tablet Take by mouth.     pantoprazole (PROTONIX) 40 MG tablet Take 1 tablet by mouth in the morning and at bedtime.     potassium chloride (KLOR-CON) 10 MEQ tablet Take 1 tablet (10 mEq total) by mouth daily. 30 tablet 3   pravastatin (PRAVACHOL) 40 MG tablet Take 40 mg by mouth daily.     prochlorperazine (COMPAZINE) 10 MG tablet Take 10 mg by mouth every 6 (six) hours as needed for nausea or vomiting.     sulfamethoxazole-trimethoprim (BACTRIM DS) 800-160 MG tablet Take 1 tablet by  mouth 2 (two) times daily. Takes on Mondays and weds     triamterene-hydrochlorothiazide (DYAZIDE) 37.5-25 MG capsule Take by mouth daily.     valACYclovir (VALTREX) 500 MG tablet Take 1 tablet (500 mg total) by mouth daily. 30 tablet 3   No current facility-administered medications for this visit.   Facility-Administered Medications Ordered in Other Visits  Medication Dose Route Frequency Provider Last Rate Last Admin   0.9 %  sodium chloride infusion   Intravenous Continuous Charlaine Dalton R, MD 10 mL/hr at 06/19/21 1230 Continued from Pre-op at 06/19/21 1230   0.9 %  sodium chloride infusion   Intravenous Continuous Cammie Sickle, MD   Stopped at 12/26/16 1011   0.9 %  sodium chloride infusion   Intravenous Continuous Charlaine Dalton R, MD 10 mL/hr at 12/27/16 0930 New Bag at 12/27/16 0930   heparin lock flush 100 unit/mL  500 Units Intravenous Once Charlaine Dalton R, MD       heparin lock flush 100 unit/mL  500 Units Intravenous Once Charlaine Dalton R, MD       sodium chloride flush (NS) 0.9 % injection 10 mL  10 mL Intravenous PRN Charlaine Dalton R, MD   10 mL at 11/06/16 1000   sodium chloride flush (NS) 0.9 % injection 10 mL  10 mL Intravenous PRN Cammie Sickle, MD   10 mL at 11/07/16 0905   sodium chloride flush (NS) 0.9 % injection 10 mL  10 mL Intravenous PRN Cammie Sickle, MD       sodium chloride flush (NS) 0.9 % injection 10 mL  10 mL Intravenous PRN Charlaine Dalton R, MD   10 mL at 12/05/16 1030   sodium chloride flush (NS) 0.9 % injection 10 mL  10 mL Intravenous PRN Charlaine Dalton R, MD   10 mL at 12/27/16 0930   sodium chloride flush (NS) 0.9 % injection 10 mL  10 mL Intravenous PRN Cammie Sickle, MD   10 mL at 09/27/20 1305    PHYSICAL EXAMINATION: ECOG PERFORMANCE STATUS: 1 - Symptomatic but completely ambulatory  There were no vitals taken for this visit.  There were no vitals filed for this  visit.  Tenderness left maxillary sinus area  Physical Exam HENT:     Head: Normocephalic and atraumatic.     Mouth/Throat:     Pharynx: No oropharyngeal exudate.  Eyes:     Pupils: Pupils are equal, round, and reactive to light.  Cardiovascular:     Rate and Rhythm: Normal rate and regular rhythm.  Pulmonary:     Effort: No  respiratory distress.     Breath sounds: No wheezing.     Comments: Bilateral crackles at the bases.  No wheeze. Abdominal:     General: Bowel sounds are normal. There is no distension.     Palpations: Abdomen is soft. There is no mass.     Tenderness: There is no abdominal tenderness. There is no guarding or rebound.  Musculoskeletal:        General: No tenderness. Normal range of motion.     Cervical back: Normal range of motion and neck supple.  Skin:    General: Skin is warm.  Neurological:     Mental Status: She is alert and oriented to person, place, and time.  Psychiatric:        Mood and Affect: Affect normal.    LABORATORY DATA:  I have reviewed the data as listed    Component Value Date/Time   NA 139 09/27/2022 0903   K 3.8 09/27/2022 0903   K 4.1 07/01/2014 1429   CL 106 09/27/2022 0903   CO2 24 09/27/2022 0903   GLUCOSE 107 (H) 09/27/2022 0903   BUN 17 09/27/2022 0903   CREATININE 0.82 09/27/2022 0903   CALCIUM 9.0 09/27/2022 0903   PROT 6.6 09/27/2022 0903   ALBUMIN 3.7 09/27/2022 0903   AST 29 09/27/2022 0903   ALT 17 09/27/2022 0903   ALKPHOS 53 09/27/2022 0903   BILITOT 0.4 09/27/2022 0903   GFRNONAA >60 09/27/2022 0903   GFRAA >60 03/28/2020 1305    No results found for: "SPEP", "UPEP"  Lab Results  Component Value Date   WBC 2.9 (L) 09/27/2022   NEUTROABS 1.5 (L) 09/27/2022   HGB 10.8 (L) 09/27/2022   HCT 33.4 (L) 09/27/2022   MCV 91.8 09/27/2022   PLT 169 09/27/2022      Chemistry      Component Value Date/Time   NA 139 09/27/2022 0903   K 3.8 09/27/2022 0903   K 4.1 07/01/2014 1429   CL 106 09/27/2022  0903   CO2 24 09/27/2022 0903   BUN 17 09/27/2022 0903   CREATININE 0.82 09/27/2022 0903      Component Value Date/Time   CALCIUM 9.0 09/27/2022 0903   ALKPHOS 53 09/27/2022 0903   AST 29 09/27/2022 0903   ALT 17 09/27/2022 0903   BILITOT 0.4 09/27/2022 0903       RADIOGRAPHIC STUDIES: I have personally reviewed the radiological images as listed and agreed with the findings in the report. No results found.   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 PET DEC 2023- Stable PET-CT findings. No recurrent lymphoma is identified [ [place] 1].  Monitor follow-up. stable  # Secondary immunodeficiency -multiple infections [UTI/resipiratory; COVID] s/p IVIG x3-  DEC 2023-IgG-260  [trough goal: >400- 500].  JAN 2024- IgG- 466- Proceed with IVIG infusion monthly x2 more. Pre-meds: Continue Dex; Tylenol; benadryl 50 night pror.  Continue prophylactic antibiotics- Bactrim M/W/F; valtrex 500 mg/day- see below.  stable  # Mild neutropenia ANC 1.5 / Anemia-Hb 10-11-likely secondary to CAR-T therapy; no concern for any progressive disease. DEC 2023-ferritin 16 saturation 20- stable  # Prophylactic antibiotics with Valtrex and Bactrim [until CD 4 count > 200]. Repeat  CD4- JAN 2024-35- Refilled antibiotics.  prophylactic antibiotics- Bactrim M/W/F; valtrex 500 mg/day.   # Peripheral neuropathy/left foot drop-stable  # ILD- stable  # Mediport-IV  access-stable; port flush. stable  # DISPOSITION: # IVIG as planned tomorrow # follow up in 1 month MD; port/ labs- cbc/cmp; LDH--D-2  IVIG infusion- Dr.B      Orders Placed This Encounter  Procedures   Lactate dehydrogenase    Standing Status:   Future    Standing Expiration Date:   09/28/2023   CBC with Differential    Standing Status:   Future    Standing Expiration Date:   09/27/2023    Comprehensive metabolic panel    Standing Status:   Future    Standing Expiration Date:   09/27/2023   Lactate dehydrogenase    Standing Status:   Future    Standing Expiration Date:   09/27/2023   All questions were answered. The patient knows to call the clinic with any problems, questions or concerns.      Cammie Sickle, MD 09/27/2022 10:58 AM

## 2022-09-27 NOTE — Assessment & Plan Note (Addendum)
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 PET DEC 2023- Stable PET-CT findings. No recurrent lymphoma is identified [ [place] 1].  Monitor follow-up. stable  # Secondary immunodeficiency -multiple infections [UTI/resipiratory; COVID] s/p IVIG x3-  DEC 2023-IgG-260  [trough goal: >400- 500].  JAN 2024- IgG- 466- Proceed with IVIG infusion monthly x2 more. Pre-meds: Continue Dex; Tylenol; benadryl 50 night pror.  Continue prophylactic antibiotics- Bactrim M/W/F; valtrex 500 mg/day- see below.  stable  # Mild neutropenia ANC 1.5 / Anemia-Hb 10-11-likely secondary to CAR-T therapy; no concern for any progressive disease. DEC 2023-ferritin 16 saturation 20- stable  # Prophylactic antibiotics with Valtrex and Bactrim [until CD 4 count > 200]. Repeat  CD4- JAN 2024-35- Refilled antibiotics.  prophylactic antibiotics- Bactrim M/W/F; valtrex 500 mg/day.   # Peripheral neuropathy/left foot drop-stable  # ILD- stable  # Mediport-IV access-stable; port flush. stable  # DISPOSITION: # IVIG as planned tomorrow # follow up in 1 month MD; port/ labs- cbc/cmp; LDH--D-2  IVIG infusion- Dr.B

## 2022-09-27 NOTE — Progress Notes (Signed)
Pt here for Treatment. Has chronic diarrhea. Appetite is getting better. Does feel tired a lot. She denies any obvious side effects from her treatment.

## 2022-09-28 ENCOUNTER — Inpatient Hospital Stay: Payer: Medicare PPO

## 2022-09-28 VITALS — BP 153/76 | HR 69 | Temp 96.5°F | Resp 18 | Ht 67.0 in | Wt 139.6 lb

## 2022-09-28 DIAGNOSIS — C8333 Diffuse large B-cell lymphoma, intra-abdominal lymph nodes: Secondary | ICD-10-CM

## 2022-09-28 DIAGNOSIS — E860 Dehydration: Secondary | ICD-10-CM

## 2022-09-28 DIAGNOSIS — D849 Immunodeficiency, unspecified: Secondary | ICD-10-CM | POA: Diagnosis not present

## 2022-09-28 MED ORDER — SODIUM CHLORIDE 0.9 % IV SOLN
10.0000 mg | Freq: Once | INTRAVENOUS | Status: AC
  Administered 2022-09-28: 10 mg via INTRAVENOUS
  Filled 2022-09-28: qty 10

## 2022-09-28 MED ORDER — HEPARIN SOD (PORK) LOCK FLUSH 100 UNIT/ML IV SOLN
500.0000 [IU] | Freq: Once | INTRAVENOUS | Status: AC | PRN
  Administered 2022-09-28: 500 [IU]
  Filled 2022-09-28: qty 5

## 2022-09-28 MED ORDER — SODIUM CHLORIDE 0.9 % IV SOLN
Freq: Once | INTRAVENOUS | Status: DC
  Filled 2022-09-28: qty 250

## 2022-09-28 MED ORDER — ACETAMINOPHEN 325 MG PO TABS
650.0000 mg | ORAL_TABLET | Freq: Once | ORAL | Status: AC
  Administered 2022-09-28: 650 mg via ORAL
  Filled 2022-09-28: qty 2

## 2022-09-28 MED ORDER — DIPHENHYDRAMINE HCL 25 MG PO CAPS
50.0000 mg | ORAL_CAPSULE | Freq: Once | ORAL | Status: AC
  Administered 2022-09-28: 50 mg via ORAL
  Filled 2022-09-28: qty 2

## 2022-09-28 MED ORDER — IMMUNE GLOBULIN (HUMAN) 5 GM/50ML IV SOLN
25.0000 g | Freq: Once | INTRAVENOUS | Status: AC
  Administered 2022-09-28: 25 g via INTRAVENOUS
  Filled 2022-09-28: qty 200

## 2022-09-28 MED ORDER — MONTELUKAST SODIUM 10 MG PO TABS
10.0000 mg | ORAL_TABLET | Freq: Once | ORAL | Status: AC
  Administered 2022-09-28: 10 mg via ORAL
  Filled 2022-09-28: qty 1

## 2022-09-28 MED ORDER — DEXTROSE 5 % IV SOLN
Freq: Once | INTRAVENOUS | Status: AC
  Filled 2022-09-28: qty 250

## 2022-09-28 NOTE — Patient Instructions (Signed)
[place] [facility]  Discharge Instructions: Thank you for choosing [facility] to provide your oncology and hematology care.  If you have a lab appointment with the [facility], please go directly to the [facility] and check in at the registration area.  Wear comfortable clothing and clothing appropriate for easy access to any Portacath or PICC line.   We strive to give you quality time with your provider. You may need to reschedule your appointment if you arrive late (15 or more minutes).  Arriving late affects you and other patients whose appointments are after yours.  Also, if you miss three or more appointments without notifying the office, you may be dismissed from the clinic at the provider's discretion.      For prescription refill requests, have your pharmacy contact our office and allow 72 hours for refills to be completed.    Today you received the following chemotherapy and/or immunotherapy agents IVIG      To help prevent nausea and vomiting after your treatment, we encourage you to take your nausea medication as directed.  BELOW ARE SYMPTOMS THAT SHOULD BE REPORTED IMMEDIATELY: *FEVER GREATER THAN 100.4 F (38 C) OR HIGHER *CHILLS OR SWEATING *NAUSEA AND VOMITING THAT IS NOT CONTROLLED WITH YOUR NAUSEA MEDICATION *UNUSUAL SHORTNESS OF BREATH *UNUSUAL BRUISING OR BLEEDING *URINARY PROBLEMS (pain or burning when urinating, or frequent urination) *BOWEL PROBLEMS (unusual diarrhea, constipation, pain near the anus) TENDERNESS IN MOUTH AND THROAT WITH OR WITHOUT PRESENCE OF ULCERS (sore throat, sores in mouth, or a toothache) UNUSUAL RASH, SWELLING OR PAIN  UNUSUAL VAGINAL DISCHARGE OR ITCHING   Items with * indicate a potential emergency and should be followed up as soon as possible or go to the Emergency Department if any problems should occur.  Please show the CHEMOTHERAPY ALERT CARD or IMMUNOTHERAPY ALERT CARD at check-in to the  Emergency Department and triage nurse.  Should you have questions after your visit or need to cancel or reschedule your appointment, please contact [facility]  [phone] and follow the prompts.  Office hours are 8:00 a.m. to 4:30 p.m. Monday - Friday. Please note that voicemails left after 4:00 p.m. may not be returned until the following business day.  We are closed weekends and major holidays. You have access to a nurse at all times for urgent questions. Please call the main number to the clinic [phone] and follow the prompts.  For any non-urgent questions, you may also contact your provider using MyChart. We now offer e-Visits for anyone 28 and older to request care online for non-urgent symptoms. For details visit [email or web address].   Also download the MyChart app! Go to the app store, search "MyChart", open the app, select [facility], and log in with your MyChart username and password. . Immune Globulin Injection What is this medication? IMMUNE GLOBULIN (im MUNE  GLOB yoo lin) helps to prevent or reduce the severity of certain infections in patients who are at risk. This medicine is collected from the pooled blood of many donors. It is used to treat immune system problems, thrombocytopenia, and Kawasaki syndrome. This medicine may be used for other purposes; ask your health care provider or pharmacist if you have questions. This medicine may be used for other purposes; ask your health care provider or pharmacist if you have questions. COMMON BRAND NAME(S): ASCENIV, Baygam, BIVIGAM, Carimune, Carimune NF, cutaquig, Cuvitru, Flebogamma, Flebogamma DIF, GamaSTAN, GamaSTAN S/D, Gamimune N, Gammagard, Gammagard S/D,  Gammaked, Gammaplex, Gammar-P IV, Gamunex, Gamunex-C, Hizentra, Iveegam, Iveegam EN, Octagam, Panglobulin, Panglobulin NF, panzyga, Polygam S/D, Privigen, Sandoglobulin, Venoglobulin-S, Vigam, Vivaglobulin, Xembify What should I tell my care  team before I take this medication? They need to know if you have any of these conditions: diabetes extremely low or no immune antibodies in the blood heart disease history of blood clots hyperprolinemia infection in the blood, sepsis kidney disease recently received or scheduled to receive a vaccination an unusual or allergic reaction to human immune globulin, albumin, maltose, sucrose, other medicines, foods, dyes, or preservatives pregnant or trying to get pregnant breast-feeding How should I use this medication? This medicine is for injection into a muscle or infusion into a vein or skin. It is usually given by a health care professional in a hospital or clinic setting. In rare cases, some brands of this medicine might be given at home. You will be taught how to give this medicine. Use exactly as directed. Take your medicine at regular intervals. Do not take your medicine more often than directed. Talk to your pediatrician regarding the use of this medicine in children. While this drug may be prescribed for selected conditions, precautions do apply. Overdosage: If you think you have taken too much of this medicine contact a poison control center or emergency room at once. NOTE: This medicine is only for you. Do not share this medicine with others. Overdosage: If you think you have taken too much of this medicine contact a poison control center or emergency room at once. NOTE: This medicine is only for you. Do not share this medicine with others. What if I miss a dose? It is important not to miss your dose. Call your doctor or health care professional if you are unable to keep an appointment. If you give yourself the medicine and you miss a dose, take it as soon as you can. If it is almost time for your next dose, take only that dose. Do not take double or extra doses. What may interact with this medication? aspirin and aspirin-like medicines cisplatin cyclosporine medicines for  infection like acyclovir, adefovir, amphotericin B, bacitracin, cidofovir, foscarnet, ganciclovir, gentamicin, pentamidine, vancomycin NSAIDS, medicines for pain and inflammation, like ibuprofen or naproxen pamidronate vaccines zoledronic acid This list may not describe all possible interactions. Give your health care provider a list of all the medicines, herbs, non-prescription drugs, or dietary supplements you use. Also tell them if you smoke, drink alcohol, or use illegal drugs. Some items may interact with your medicine. This list may not describe all possible interactions. Give your health care provider a list of all the medicines, herbs, non-prescription drugs, or dietary supplements you use. Also tell them if you smoke, drink alcohol, or use illegal drugs. Some items may interact with your medicine. What should I watch for while using this medication? Your condition will be monitored carefully while you are receiving this medicine. This medicine is made from pooled blood donations of many different people. It may be possible to pass an infection in this medicine. However, the donors are screened for infections and all products are tested for HIV and hepatitis. The medicine is treated to kill most or all bacteria and viruses. Talk to your doctor about the risks and benefits of this medicine. Do not have vaccinations for at least 14 days before, or until at least 3 months after receiving this medicine. What side effects may I notice from receiving this medication? Side effects that you should report to your  doctor or health care professional as soon as possible: allergic reactions like skin rash, itching or hives, swelling of the face, lips, or tongue blue colored lips or skin breathing problems chest pain or tightness fever signs and symptoms of aseptic meningitis such as stiff neck; sensitivity to light; headache; drowsiness; fever; nausea; vomiting; rash signs and symptoms of a blood clot  such as chest pain; shortness of breath; pain, swelling, or warmth in the leg signs and symptoms of hemolytic anemia such as fast heartbeat; tiredness; dark yellow or brown urine; or yellowing of the eyes or skin signs and symptoms of kidney injury like trouble passing urine or change in the amount of urine sudden weight gain swelling of the ankles, feet, hands Side effects that usually do not require medical attention (report to your doctor or health care professional if they continue or are bothersome): diarrhea flushing headache increased sweating joint pain muscle cramps muscle pain nausea pain, redness, or irritation at site where injected tiredness This list may not describe all possible side effects. Call your doctor for medical advice about side effects. You may report side effects to FDA at 1-800-FDA-1088. This list may not describe all possible side effects. Call your doctor for medical advice about side effects. You may report side effects to FDA at 1-800-FDA-1088. Where should I keep my medication? Keep out of the reach of children. This drug is usually given in a hospital or clinic and will not be stored at home. In rare cases, some brands of this medicine may be given at home. If you are using this medicine at home, you will be instructed on how to store this medicine. Throw away any unused medicine after the expiration date on the label. NOTE: This sheet is a summary. It may not cover all possible information. If you have questions about this medicine, talk to your doctor, pharmacist, or health care provider.  2023 Elsevier/Gold Standard (2008-10-28 00:00:00)

## 2022-10-26 ENCOUNTER — Inpatient Hospital Stay: Payer: Medicare PPO | Admitting: Internal Medicine

## 2022-10-26 ENCOUNTER — Encounter: Payer: Self-pay | Admitting: Internal Medicine

## 2022-10-26 ENCOUNTER — Inpatient Hospital Stay: Payer: Medicare PPO | Attending: Internal Medicine

## 2022-10-26 VITALS — BP 124/70 | HR 73 | Temp 98.4°F | Resp 16 | Wt 141.4 lb

## 2022-10-26 DIAGNOSIS — G629 Polyneuropathy, unspecified: Secondary | ICD-10-CM | POA: Diagnosis not present

## 2022-10-26 DIAGNOSIS — Z87891 Personal history of nicotine dependence: Secondary | ICD-10-CM | POA: Insufficient documentation

## 2022-10-26 DIAGNOSIS — I10 Essential (primary) hypertension: Secondary | ICD-10-CM | POA: Diagnosis not present

## 2022-10-26 DIAGNOSIS — E119 Type 2 diabetes mellitus without complications: Secondary | ICD-10-CM | POA: Insufficient documentation

## 2022-10-26 DIAGNOSIS — R197 Diarrhea, unspecified: Secondary | ICD-10-CM | POA: Insufficient documentation

## 2022-10-26 DIAGNOSIS — Z79624 Long term (current) use of inhibitors of nucleotide synthesis: Secondary | ICD-10-CM | POA: Diagnosis not present

## 2022-10-26 DIAGNOSIS — Z7952 Long term (current) use of systemic steroids: Secondary | ICD-10-CM | POA: Diagnosis not present

## 2022-10-26 DIAGNOSIS — Z8572 Personal history of non-Hodgkin lymphomas: Secondary | ICD-10-CM | POA: Diagnosis present

## 2022-10-26 DIAGNOSIS — D849 Immunodeficiency, unspecified: Secondary | ICD-10-CM | POA: Diagnosis present

## 2022-10-26 DIAGNOSIS — Z95828 Presence of other vascular implants and grafts: Secondary | ICD-10-CM

## 2022-10-26 DIAGNOSIS — C8333 Diffuse large B-cell lymphoma, intra-abdominal lymph nodes: Secondary | ICD-10-CM

## 2022-10-26 DIAGNOSIS — R0602 Shortness of breath: Secondary | ICD-10-CM | POA: Diagnosis not present

## 2022-10-26 DIAGNOSIS — Z79899 Other long term (current) drug therapy: Secondary | ICD-10-CM | POA: Insufficient documentation

## 2022-10-26 LAB — COMPREHENSIVE METABOLIC PANEL
ALT: 19 U/L (ref 0–44)
AST: 31 U/L (ref 15–41)
Albumin: 3.8 g/dL (ref 3.5–5.0)
Alkaline Phosphatase: 52 U/L (ref 38–126)
Anion gap: 8 (ref 5–15)
BUN: 14 mg/dL (ref 8–23)
CO2: 24 mmol/L (ref 22–32)
Calcium: 8.5 mg/dL — ABNORMAL LOW (ref 8.9–10.3)
Chloride: 106 mmol/L (ref 98–111)
Creatinine, Ser: 0.65 mg/dL (ref 0.44–1.00)
GFR, Estimated: >60 mL/min (ref >60)
Glucose, Bld: 106 mg/dL — ABNORMAL HIGH (ref 70–99)
Potassium: 3.8 mmol/L (ref 3.5–5.1)
Sodium: 138 mmol/L (ref 135–145)
Total Bilirubin: 0.3 mg/dL (ref 0.3–1.2)
Total Protein: 6.5 g/dL (ref 6.5–8.1)

## 2022-10-26 LAB — CBC WITH DIFFERENTIAL/PLATELET
Abs Immature Granulocytes: 0.07 10*3/uL (ref 0.00–0.07)
Basophils Absolute: 0 10*3/uL (ref 0.0–0.1)
Basophils Relative: 1 %
Eosinophils Absolute: 0.3 10*3/uL (ref 0.0–0.5)
Eosinophils Relative: 10 %
HCT: 32.5 % — ABNORMAL LOW (ref 36.0–46.0)
Hemoglobin: 10.5 g/dL — ABNORMAL LOW (ref 12.0–15.0)
Immature Granulocytes: 2 %
Lymphocytes Relative: 16 %
Lymphs Abs: 0.5 10*3/uL — ABNORMAL LOW (ref 0.7–4.0)
MCH: 29.7 pg (ref 26.0–34.0)
MCHC: 32.3 g/dL (ref 30.0–36.0)
MCV: 91.8 fL (ref 80.0–100.0)
Monocytes Absolute: 0.4 10*3/uL (ref 0.1–1.0)
Monocytes Relative: 13 %
Neutro Abs: 1.8 10*3/uL (ref 1.7–7.7)
Neutrophils Relative %: 58 %
Platelets: 159 10*3/uL (ref 150–400)
RBC: 3.54 MIL/uL — ABNORMAL LOW (ref 3.87–5.11)
RDW: 14.8 % (ref 11.5–15.5)
WBC: 3 10*3/uL — ABNORMAL LOW (ref 4.0–10.5)
nRBC: 0 % (ref 0.0–0.2)

## 2022-10-26 LAB — LACTATE DEHYDROGENASE: LDH: 222 U/L — ABNORMAL HIGH (ref 98–192)

## 2022-10-26 MED ORDER — HEPARIN SOD (PORK) LOCK FLUSH 100 UNIT/ML IV SOLN
500.0000 [IU] | Freq: Once | INTRAVENOUS | Status: AC
  Administered 2022-10-26: 500 [IU] via INTRAVENOUS
  Filled 2022-10-26: qty 5

## 2022-10-26 MED ORDER — SODIUM CHLORIDE 0.9% FLUSH
10.0000 mL | Freq: Once | INTRAVENOUS | Status: AC
  Administered 2022-10-26: 10 mL via INTRAVENOUS
  Filled 2022-10-26: qty 10

## 2022-10-26 MED ORDER — MONTELUKAST SODIUM 10 MG PO TABS: 10.0000 mg | ORAL_TABLET | Freq: Every day | ORAL | 11 refills | Status: DC

## 2022-10-26 NOTE — Assessment & Plan Note (Addendum)
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 PET DEC 2023- Stable PET-CT findings. No recurrent lymphoma is identified [ [place] 1].  Monitor clinical basis.  Stable  # Secondary immunodeficiency -multiple infections [UTI/resipiratory; COVID] s/p IVIG x3-  DEC 2023-IgG-260  [trough goal: >400- 500].  JAN 2024- IgG- 466- Proceed with IVIG infusion monthly x 1 more. Pre-meds: Continue Dex; Tylenol; benadryl 50 night pror.  Also recommend Tylenol; benadryl 50 night  x3 days after infusion.  Continue prophylactic antibiotics- Bactrim M/W/F; valtrex 500 mg/day- see below.  Stable   # Skin rash postinfusion-no evidence of any anaphylactic reaction.  Also recommend Tylenol; benadryl 50 night  x3 days after infusion- see above.   # Mild neutropenia ANC 1.5 / Anemia-Hb 10-11-likely secondary to CAR-T therapy; no concern for any progressive disease. DEC 2023-ferritin 16 saturation 20- Recommend gentle iron [iron biglycinate; 28 mg ] 1 pill a day.  This pill is unlikely to cause stomach upset or cause constipation.   # Prophylactic antibiotics with Valtrex and Bactrim [until CD 4 count > 200]. Repeat  CD4- JAN 2024-35- Refilled antibiotics.  prophylactic antibiotics- Bactrim M/W/F; valtrex 500 mg/day.  stable  # Peripheral neuropathy/left foot drop- stable  # ILD-  stable  # Mediport-IV access-stable; port flush. stable  # DISPOSITION: # IVIG as planned on Monday- # follow up in 2  month MD;NO Infusion-  1 week prior- port/ labs- cbc/cmp; LDH; iron studies; ferritin; quantitative immunoglobulin- - Dr.B

## 2022-10-26 NOTE — Progress Notes (Signed)
[facility] OFFICE PROGRESS NOTE  Patient Care Team: Rusty Aus, [name] Cammie Sickle, MD as Consulting Physician (Hematology and Oncology) Efrain Sella, MD as Consulting Physician (Gastroenterology)   Cancer Staging  No matching staging information was found for the patient.   Oncology History Overview Note  DEC 2017- DLBCL "TRIPLE HIT [myc/ bcl-2/bcl-6 gene rearrangement FISH]" ~10 cm mass RP LN; STAGE II [jan 2018- BMBx-NEG]; Jan 8th R-[facility];   # JAN 29th 2018- DA-R EPOCH x5 ; PET CR; s/p RT [last 03/18/2017]  # FEB-MARCH 2019- RECURRENCE of DLBCL [s/p RP Bx; 4 cm mass inferior to Left Kidney] ; II [facility]; Dr.Grover.   # April 3rd 2019- R-GDP [carbo]; MAY 2019- PET PR; NOV 2019- PR  #August 2020-recurrence bulky left pelvis/retroperitoneal [9-10 cm]; no biopsy. #August 24th-Rituxan weekly cycle #1; prednisone 60 mg a day.   06/03/2019 - Chemotherapy  BMT OP CAR-T LYMPHODEPLETION FLU/CY + IP AXICABTAGENE CILOLEUCEL (YESCARTA) Fludarabine 30 mg/m2 IV Days 1, 2, 3 Cyclophosphamide 500 mg/m2 IV Days 1, 2, 3 Axicabtagene ciloleucel target dose 2 x 10^6 CAR-positive viable T cells per kg body weight (maximum of 2 x 10^8 CAR-positive viable T cells) on Day 6.  # SEP 2020- 67monthPET-[facility]- significant PR.   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    05/07/2019 - 05/07/2019 Chemotherapy   The patient had riTUXimab-hyaluronidase human (RITUXAN HYCELA) 1400-23400 MG -UT/11.7ML injection SQ 1,400 mg, 1,400 mg, Subcutaneous,  Once, 1 of 4 cycles Administration: 1,400 mg (05/07/2019)  for chemotherapy treatment.      INTERVAL HISTORY: Patient is accompanied by husband. She ambulating independently.  [name].[id number].  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  Patient denied any further infections UTIs or pneumonias.  No hospitalizations.  Patient noted to have rash on her face and also upper chest after infusion last time.  Last for 3 days.  Denies any throat swelling or difficulty swallowing or difficulty breathing.  Patient has chronic mild diarrhea not any worse. Appetite is getting better.  Chronic mild shortness of breath not any worse.  Neuropathy is stable on the Gabapentin.  Denies any new lumps or bumps. No night sweats.  No worsening back pain.    Review of Systems  Constitutional:  Positive for malaise/fatigue. Negative for chills, diaphoresis, fever and weight loss.  HENT:  Negative for nosebleeds and sore throat.   Eyes:  Negative for double vision.  Respiratory:  Positive for shortness of breath. Negative for cough, hemoptysis, sputum production and wheezing.   Cardiovascular:  Negative for chest pain, palpitations, orthopnea and leg swelling.  Gastrointestinal:  Negative for abdominal pain, blood in stool, constipation, diarrhea, melena and vomiting.  Genitourinary:  Negative for dysuria, frequency and urgency.  Musculoskeletal:  Positive for joint pain.  Skin: Negative.  Negative for itching and rash.  Neurological:  Positive for tingling. Negative for focal weakness and weakness.   Endo/Heme/Allergies:  Does not bruise/bleed easily.  Psychiatric/Behavioral:  Negative for depression. The patient is not nervous/anxious and does not have insomnia.     PAST MEDICAL HISTORY :  Past Medical History:  Diagnosis Date   Diabetes mellitus without complication ([place])    Diffuse large B-cell lymphoma ([place])    Chemo + rad tx's   Dysplastic nevus 03/14/2020   left inf med scapula moderate atypia    Dysplastic nevus 03/14/2020   left lat breast parallel to areaola, moderate atypia    Heart murmur    History of chemotherapy 07/08/2017   History of gastric ulcer    History of radiation therapy 07/08/2017   Hypercholesterolemia    Hypertension    ILD (interstitial lung disease) ([place])    8 yrs ago   Lymphadenopathy     PAST SURGICAL HISTORY :   Past Surgical History:  Procedure Laterality Date   BREAST BIOPSY Left 2010   neg   CATARACT EXTRACTION W/PHACO Left 10/16/2017   Procedure: CATARACT EXTRACTION PHACO AND INTRAOCULAR LENS PLACEMENT ([place]) COMPLICATED DIABETIC LEFT;  Surgeon: Leandrew Koyanagi, MD;  Location: [facility];  Service: Ophthalmology;  Laterality: Left;  IRIS HOOKS Diabetic - oral meds   COLONOSCOPY N/A 06/19/2021   Procedure: COLONOSCOPY;  Surgeon: Annamaria Helling, DO;  Location: [facility] ENDOSCOPY;  Service: Gastroenterology;  Laterality: N/A;   ESOPHAGOGASTRODUODENOSCOPY N/A 06/19/2021   Procedure: ESOPHAGOGASTRODUODENOSCOPY (EGD);  Surgeon: Annamaria Helling, DO;  Location: [facility] ENDOSCOPY;  Service: Gastroenterology;  Laterality: N/A;   HERNIA REPAIR     PARTIAL HYSTERECTOMY     age 77   PERIPHERAL VASCULAR CATHETERIZATION N/A 08/22/2016   Procedure: [name];  Surgeon: [name], MD;  Location: [facility] CV LAB;  Service: Cardiovascular;  Laterality: N/A;   TONSILLECTOMY      FAMILY HISTORY :   Family History  Problem Relation Age of Onset   Heart disease Mother    Heart disease Father    Heart disease  Brother     SOCIAL HISTORY:   Social History   Tobacco Use   Smoking status: Former    Types: Cigarettes    Quit date: 08/21/1958    Years since quitting: 64.2   Smokeless tobacco: Never  Vaping Use   Vaping Use: Never used  Substance Use Topics   Alcohol use: No   Drug use: No    ALLERGIES:  is allergic to levaquin [levofloxacin in d5w], cefuroxime axetil, and doxycycline.  MEDICATIONS:  Current Outpatient Medications  Medication Sig Dispense Refill   acetaminophen (TYLENOL) 325 MG tablet Take by mouth.     b complex vitamins tablet Take 1 tablet by mouth daily.     dexamethasone (DECADRON) 4 MG tablet Take 1 tablet (4 mg total) by mouth 2 (two) times daily. For 1 day; prior to infusion. 30 tablet 0   gabapentin (NEURONTIN) 300 MG capsule Take 1 capsule (300 mg total) by mouth 2 (two) times daily. 60 capsule 3   hyoscyamine (LEVSIN SL) 0.125 MG SL tablet Place 0.125 mg under the tongue every 4 (four) hours as needed.     ibuprofen (ADVIL) 200 MG tablet Take by mouth.     latanoprost (XALATAN) 0.005 % ophthalmic solution Place 1 drop into both eyes at bedtime.      Magnesium 400 MG TABS Take 1 tablet by mouth daily.  metoprolol tartrate (LOPRESSOR) 25 MG tablet TAKE 1 TABLET BY MOUTH TWICE DAILY (Patient taking differently: 12.5 mg 2 (two) times daily. (BETA BLOCKER)) 180 tablet 0   potassium chloride (KLOR-CON) 10 MEQ tablet Take 1 tablet (10 mEq total) by mouth daily. 30 tablet 3   pravastatin (PRAVACHOL) 40 MG tablet Take 40 mg by mouth daily.     sulfamethoxazole-trimethoprim (BACTRIM DS) 800-160 MG tablet Take 1 tablet by mouth 2 (two) times daily. Takes on Mondays and weds     triamterene-hydrochlorothiazide (DYAZIDE) 37.5-25 MG capsule Take by mouth daily.     valACYclovir (VALTREX) 500 MG tablet Take 1 tablet (500 mg total) by mouth daily. 30 tablet 3   montelukast (SINGULAIR) 10 MG tablet Take 1 tablet (10 mg total) by mouth at bedtime. One a day. 30 tablet 11    ondansetron (ZOFRAN) 8 MG tablet Take by mouth. (Patient not taking: Reported on 10/26/2022)     pantoprazole (PROTONIX) 40 MG tablet Take 1 tablet by mouth in the morning and at bedtime. (Patient not taking: Reported on 10/26/2022)     prochlorperazine (COMPAZINE) 10 MG tablet Take 10 mg by mouth every 6 (six) hours as needed for nausea or vomiting. (Patient not taking: Reported on 10/26/2022)     No current facility-administered medications for this visit.   Facility-Administered Medications Ordered in Other Visits  Medication Dose Route Frequency Provider Last Rate Last Admin   0.9 %  sodium chloride infusion   Intravenous Continuous Cammie Sickle, MD 10 mL/hr at 06/19/21 1230 Continued from Pre-op at 06/19/21 1230   0.9 %  sodium chloride infusion   Intravenous Continuous Cammie Sickle, MD   Stopped at 12/26/16 1011   0.9 %  sodium chloride infusion   Intravenous Continuous Charlaine Dalton R, MD 10 mL/hr at 12/27/16 0930 New Bag at 12/27/16 0930   heparin lock flush 100 unit/mL  500 Units Intravenous Once Charlaine Dalton R, MD       heparin lock flush 100 unit/mL  500 Units Intravenous Once Charlaine Dalton R, MD       sodium chloride flush (NS) 0.9 % injection 10 mL  10 mL Intravenous PRN Charlaine Dalton R, MD   10 mL at 11/06/16 1000   sodium chloride flush (NS) 0.9 % injection 10 mL  10 mL Intravenous PRN Cammie Sickle, MD   10 mL at 11/07/16 0905   sodium chloride flush (NS) 0.9 % injection 10 mL  10 mL Intravenous PRN Cammie Sickle, MD       sodium chloride flush (NS) 0.9 % injection 10 mL  10 mL Intravenous PRN Charlaine Dalton R, MD   10 mL at 12/05/16 1030   sodium chloride flush (NS) 0.9 % injection 10 mL  10 mL Intravenous PRN Charlaine Dalton R, MD   10 mL at 12/27/16 0930   sodium chloride flush (NS) 0.9 % injection 10 mL  10 mL Intravenous PRN Cammie Sickle, MD   10 mL at 09/27/20 1305    PHYSICAL EXAMINATION: ECOG  PERFORMANCE STATUS: 1 - Symptomatic but completely ambulatory  BP 124/70 (BP Location: Left Arm, Patient Position: Sitting)   Pulse 73   Temp 98.4 F (36.9 C)   Resp 16   Wt 141 lb 6.4 oz (64.1 kg)   SpO2 99%   BMI 22.15 kg/m   Filed Weights   10/26/22 1113  Weight: 141 lb 6.4 oz (64.1 kg)    Tenderness left maxillary sinus area  Physical Exam HENT:     Head: Normocephalic and atraumatic.     Mouth/Throat:     Pharynx: No oropharyngeal exudate.  Eyes:     Pupils: Pupils are equal, round, and reactive to light.  Cardiovascular:     Rate and Rhythm: Normal rate and regular rhythm.  Pulmonary:     Effort: No respiratory distress.     Breath sounds: No wheezing.     Comments: Bilateral crackles at the bases.  No wheeze. Abdominal:     General: Bowel sounds are normal. There is no distension.     Palpations: Abdomen is soft. There is no mass.     Tenderness: There is no abdominal tenderness. There is no guarding or rebound.  Musculoskeletal:        General: No tenderness. Normal range of motion.     Cervical back: Normal range of motion and neck supple.  Skin:    General: Skin is warm.  Neurological:     Mental Status: She is alert and oriented to person, place, and time.  Psychiatric:        Mood and Affect: Affect normal.    LABORATORY DATA:  I have reviewed the data as listed    Component Value Date/Time   NA 138 10/26/2022 1047   K 3.8 10/26/2022 1047   K 4.1 07/01/2014 1429   CL 106 10/26/2022 1047   CO2 24 10/26/2022 1047   GLUCOSE 106 (H) 10/26/2022 1047   BUN 14 10/26/2022 1047   CREATININE 0.65 10/26/2022 1047   CALCIUM 8.5 (L) 10/26/2022 1047   PROT 6.5 10/26/2022 1047   ALBUMIN 3.8 10/26/2022 1047   AST 31 10/26/2022 1047   ALT 19 10/26/2022 1047   ALKPHOS 52 10/26/2022 1047   BILITOT 0.3 10/26/2022 1047   GFRNONAA >60 10/26/2022 1047   GFRAA >60 03/28/2020 1305    No results found for: "SPEP", "UPEP"  Lab Results  Component Value Date    WBC 3.0 (L) 10/26/2022   NEUTROABS 1.8 10/26/2022   HGB 10.5 (L) 10/26/2022   HCT 32.5 (L) 10/26/2022   MCV 91.8 10/26/2022   PLT 159 10/26/2022      Chemistry      Component Value Date/Time   NA 138 10/26/2022 1047   K 3.8 10/26/2022 1047   K 4.1 07/01/2014 1429   CL 106 10/26/2022 1047   CO2 24 10/26/2022 1047   BUN 14 10/26/2022 1047   CREATININE 0.65 10/26/2022 1047      Component Value Date/Time   CALCIUM 8.5 (L) 10/26/2022 1047   ALKPHOS 52 10/26/2022 1047   AST 31 10/26/2022 1047   ALT 19 10/26/2022 1047   BILITOT 0.3 10/26/2022 1047       RADIOGRAPHIC STUDIES: I have personally reviewed the radiological images as listed and agreed with the findings in the report. No results found.   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 PET DEC 2023- Stable PET-CT findings. No recurrent lymphoma is identified [ [place] 1].  Monitor clinical basis.  Stable  # Secondary immunodeficiency -multiple infections [UTI/resipiratory; COVID] s/p IVIG x3-  DEC 2023-IgG-260  [trough goal: >400- 500].  JAN 2024- IgG- 466- Proceed with IVIG infusion monthly x 1 more. Pre-meds: Continue Dex; Tylenol; benadryl 50 night pror.  Also recommend Tylenol; benadryl 50 night  x3 days after infusion.  Continue prophylactic antibiotics-  Bactrim M/W/F; valtrex 500 mg/day- see below.  Stable   # Skin rash postinfusion-no evidence of any anaphylactic reaction.  Also recommend Tylenol; benadryl 50 night  x3 days after infusion- see above.   # Mild neutropenia ANC 1.5 / Anemia-Hb 10-11-likely secondary to CAR-T therapy; no concern for any progressive disease. DEC 2023-ferritin 16 saturation 20- Recommend gentle iron [iron biglycinate; 28 mg ] 1 pill a day.  This pill is unlikely to cause stomach upset or cause constipation.   # Prophylactic antibiotics with  Valtrex and Bactrim [until CD 4 count > 200]. Repeat  CD4- JAN 2024-35- Refilled antibiotics.  prophylactic antibiotics- Bactrim M/W/F; valtrex 500 mg/day.  stable  # Peripheral neuropathy/left foot drop- stable  # ILD-  stable  # Mediport-IV access-stable; port flush. stable  # DISPOSITION: # IVIG as planned on Monday- # follow up in 2  month MD;NO Infusion-  1 week prior- port/ labs- cbc/cmp; LDH; iron studies; ferritin; quantitative immunoglobulin- - Dr.B      Orders Placed This Encounter  Procedures   CMP ([facility] only)    Standing Status:   Future    Standing Expiration Date:   10/26/2023   CBC with Differential ([facility]cer Center Only)    Standing Status:   Future    Standing Expiration Date:   10/16/2023   Lactate dehydrogenase    Standing Status:   Future    Standing Expiration Date:   10/26/2023   Iron and TIBC    Standing Status:   Future    Standing Expiration Date:   10/26/2023   Ferritin    Standing Status:   Future    Standing Expiration Date:   10/26/2023   Immunoglobulins, QN, A/E/G/M    Standing Status:   Future    Standing Expiration Date:   10/26/2023   All questions were answered. The patient knows to call the clinic with any problems, questions or concerns.      Cammie Sickle, MD 10/26/2022 1:06 PM

## 2022-10-26 NOTE — Progress Notes (Signed)
Intermittent abdominal pain. Cramping type pain. Chronic diarrhea , having to live with out. Takes imodium or lomotil as needed. Pt has a fair appetite. Energy level is low. Occ lightheadedness. Denies night sweats or fevers. Has not noticed any swollen lymph nodes. IVIG is scheduled for Monday.

## 2022-10-26 NOTE — Patient Instructions (Addendum)
#   Recommend Tylenol 650 mg; benadryl 50 night  x 3 days after infusion.   # Recommend gentle iron [iron biglycinate; 28 mg ] 1 pill a day.  This pill is unlikely to cause stomach upset or cause constipation.

## 2022-10-29 ENCOUNTER — Inpatient Hospital Stay: Payer: Medicare PPO

## 2022-10-29 VITALS — BP 152/62 | HR 65 | Temp 97.9°F | Resp 16 | Wt 141.5 lb

## 2022-10-29 DIAGNOSIS — E860 Dehydration: Secondary | ICD-10-CM

## 2022-10-29 DIAGNOSIS — D849 Immunodeficiency, unspecified: Secondary | ICD-10-CM | POA: Diagnosis not present

## 2022-10-29 DIAGNOSIS — C8333 Diffuse large B-cell lymphoma, intra-abdominal lymph nodes: Secondary | ICD-10-CM

## 2022-10-29 MED ORDER — HEPARIN SOD (PORK) LOCK FLUSH 100 UNIT/ML IV SOLN
500.0000 [IU] | Freq: Once | INTRAVENOUS | Status: AC | PRN
  Administered 2022-10-29: 500 [IU]
  Filled 2022-10-29: qty 5

## 2022-10-29 MED ORDER — SODIUM CHLORIDE 0.9 % IV SOLN
10.0000 mg | Freq: Once | INTRAVENOUS | Status: AC
  Administered 2022-10-29: 10 mg via INTRAVENOUS
  Filled 2022-10-29: qty 1

## 2022-10-29 MED ORDER — IMMUNE GLOBULIN (HUMAN) 5 GM/100ML IV SOLN
25.0000 g | Freq: Once | INTRAVENOUS | Status: AC
  Administered 2022-10-29: 25 g via INTRAVENOUS
  Filled 2022-10-29: qty 200

## 2022-10-29 MED ORDER — DIPHENHYDRAMINE HCL 25 MG PO CAPS
50.0000 mg | ORAL_CAPSULE | Freq: Once | ORAL | Status: AC
  Administered 2022-10-29: 50 mg via ORAL
  Filled 2022-10-29: qty 2

## 2022-10-29 MED ORDER — DEXTROSE 5 % IV SOLN
Freq: Once | INTRAVENOUS | Status: AC
  Filled 2022-10-29: qty 250

## 2022-10-29 MED ORDER — MONTELUKAST SODIUM 10 MG PO TABS
10.0000 mg | ORAL_TABLET | Freq: Once | ORAL | Status: AC
  Administered 2022-10-29: 10 mg via ORAL
  Filled 2022-10-29: qty 1

## 2022-10-29 MED ORDER — IMMUNE GLOBULIN (HUMAN) 5 GM/100ML IV SOLN
25.0000 g | Freq: Once | INTRAVENOUS | Status: DC
  Filled 2022-10-29: qty 100

## 2022-10-29 MED ORDER — ACETAMINOPHEN 325 MG PO TABS
650.0000 mg | ORAL_TABLET | Freq: Once | ORAL | Status: AC
  Administered 2022-10-29: 650 mg via ORAL
  Filled 2022-10-29: qty 2

## 2022-12-09 ENCOUNTER — Other Ambulatory Visit: Payer: Self-pay | Admitting: Internal Medicine

## 2022-12-11 ENCOUNTER — Encounter: Payer: Self-pay | Admitting: Internal Medicine

## 2022-12-19 ENCOUNTER — Inpatient Hospital Stay: Payer: Medicare PPO

## 2022-12-24 ENCOUNTER — Inpatient Hospital Stay: Payer: Medicare PPO | Attending: Internal Medicine

## 2022-12-24 DIAGNOSIS — D709 Neutropenia, unspecified: Secondary | ICD-10-CM | POA: Diagnosis not present

## 2022-12-24 DIAGNOSIS — D649 Anemia, unspecified: Secondary | ICD-10-CM | POA: Insufficient documentation

## 2022-12-24 DIAGNOSIS — D849 Immunodeficiency, unspecified: Secondary | ICD-10-CM | POA: Diagnosis present

## 2022-12-24 DIAGNOSIS — R197 Diarrhea, unspecified: Secondary | ICD-10-CM | POA: Diagnosis not present

## 2022-12-24 DIAGNOSIS — Z8572 Personal history of non-Hodgkin lymphomas: Secondary | ICD-10-CM | POA: Insufficient documentation

## 2022-12-24 DIAGNOSIS — Z79899 Other long term (current) drug therapy: Secondary | ICD-10-CM | POA: Diagnosis not present

## 2022-12-24 DIAGNOSIS — Z79624 Long term (current) use of inhibitors of nucleotide synthesis: Secondary | ICD-10-CM | POA: Insufficient documentation

## 2022-12-24 DIAGNOSIS — R0602 Shortness of breath: Secondary | ICD-10-CM | POA: Insufficient documentation

## 2022-12-24 DIAGNOSIS — C8333 Diffuse large B-cell lymphoma, intra-abdominal lymph nodes: Secondary | ICD-10-CM

## 2022-12-24 DIAGNOSIS — G629 Polyneuropathy, unspecified: Secondary | ICD-10-CM | POA: Diagnosis not present

## 2022-12-24 DIAGNOSIS — Z87440 Personal history of urinary (tract) infections: Secondary | ICD-10-CM | POA: Insufficient documentation

## 2022-12-24 DIAGNOSIS — Z7952 Long term (current) use of systemic steroids: Secondary | ICD-10-CM | POA: Insufficient documentation

## 2022-12-24 DIAGNOSIS — Z87891 Personal history of nicotine dependence: Secondary | ICD-10-CM | POA: Insufficient documentation

## 2022-12-24 DIAGNOSIS — Z95828 Presence of other vascular implants and grafts: Secondary | ICD-10-CM

## 2022-12-24 LAB — CBC WITH DIFFERENTIAL (CANCER CENTER ONLY)
Abs Immature Granulocytes: 0.05 10*3/uL (ref 0.00–0.07)
Basophils Absolute: 0 10*3/uL (ref 0.0–0.1)
Basophils Relative: 1 %
Eosinophils Absolute: 0.2 10*3/uL (ref 0.0–0.5)
Eosinophils Relative: 7 %
HCT: 34.1 % — ABNORMAL LOW (ref 36.0–46.0)
Hemoglobin: 10.9 g/dL — ABNORMAL LOW (ref 12.0–15.0)
Immature Granulocytes: 2 %
Lymphocytes Relative: 11 %
Lymphs Abs: 0.3 10*3/uL — ABNORMAL LOW (ref 0.7–4.0)
MCH: 29.1 pg (ref 26.0–34.0)
MCHC: 32 g/dL (ref 30.0–36.0)
MCV: 90.9 fL (ref 80.0–100.0)
Monocytes Absolute: 0.4 10*3/uL (ref 0.1–1.0)
Monocytes Relative: 15 %
Neutro Abs: 1.8 10*3/uL (ref 1.7–7.7)
Neutrophils Relative %: 64 %
Platelet Count: 198 10*3/uL (ref 150–400)
RBC: 3.75 MIL/uL — ABNORMAL LOW (ref 3.87–5.11)
RDW: 14.5 % (ref 11.5–15.5)
WBC Count: 2.8 10*3/uL — ABNORMAL LOW (ref 4.0–10.5)
nRBC: 0 % (ref 0.0–0.2)

## 2022-12-24 LAB — CMP (CANCER CENTER ONLY)
ALT: 20 U/L (ref 0–44)
AST: 35 U/L (ref 15–41)
Albumin: 3.7 g/dL (ref 3.5–5.0)
Alkaline Phosphatase: 53 U/L (ref 38–126)
Anion gap: 9 (ref 5–15)
BUN: 12 mg/dL (ref 8–23)
CO2: 24 mmol/L (ref 22–32)
Calcium: 8.6 mg/dL — ABNORMAL LOW (ref 8.9–10.3)
Chloride: 105 mmol/L (ref 98–111)
Creatinine: 0.66 mg/dL (ref 0.44–1.00)
GFR, Estimated: >60 mL/min (ref >60)
Glucose, Bld: 160 mg/dL — ABNORMAL HIGH (ref 70–99)
Potassium: 3.5 mmol/L (ref 3.5–5.1)
Sodium: 138 mmol/L (ref 135–145)
Total Bilirubin: 0.3 mg/dL (ref 0.3–1.2)
Total Protein: 6.2 g/dL — ABNORMAL LOW (ref 6.5–8.1)

## 2022-12-24 LAB — IRON AND TIBC
Iron: 58 ug/dL (ref 28–170)
Saturation Ratios: 15 % (ref 10.4–31.8)
TIBC: 393 ug/dL (ref 250–450)
UIBC: 335 ug/dL

## 2022-12-24 LAB — FERRITIN: Ferritin: 14 ng/mL (ref 11–307)

## 2022-12-24 LAB — LACTATE DEHYDROGENASE: LDH: 203 U/L — ABNORMAL HIGH (ref 98–192)

## 2022-12-24 MED ORDER — SODIUM CHLORIDE 0.9% FLUSH
10.0000 mL | Freq: Once | INTRAVENOUS | Status: AC
  Administered 2022-12-24: 10 mL via INTRAVENOUS
  Filled 2022-12-24: qty 10

## 2022-12-24 MED ORDER — HEPARIN SOD (PORK) LOCK FLUSH 100 UNIT/ML IV SOLN
500.0000 [IU] | Freq: Once | INTRAVENOUS | Status: AC
  Administered 2022-12-24: 500 [IU] via INTRAVENOUS
  Filled 2022-12-24: qty 5

## 2022-12-26 ENCOUNTER — Inpatient Hospital Stay: Payer: Medicare PPO | Admitting: Internal Medicine

## 2022-12-28 LAB — IMMUNOGLOBULINS A/E/G/M, SERUM
IgA: 19 mg/dL — ABNORMAL LOW (ref 64–422)
IgE (Immunoglobulin E), Serum: <2 IU/mL — ABNORMAL LOW (ref 6–495)
IgG (Immunoglobin G), Serum: 431 mg/dL — ABNORMAL LOW (ref 586–1602)
IgM (Immunoglobulin M), Srm: <5 mg/dL — ABNORMAL LOW (ref 26–217)

## 2023-01-02 ENCOUNTER — Inpatient Hospital Stay: Payer: Medicare PPO | Admitting: Internal Medicine

## 2023-01-02 ENCOUNTER — Encounter: Payer: Self-pay | Admitting: Internal Medicine

## 2023-01-02 VITALS — BP 133/60 | HR 66 | Temp 98.6°F | Ht 67.0 in | Wt 136.4 lb

## 2023-01-02 DIAGNOSIS — C8333 Diffuse large B-cell lymphoma, intra-abdominal lymph nodes: Secondary | ICD-10-CM

## 2023-01-02 DIAGNOSIS — D849 Immunodeficiency, unspecified: Secondary | ICD-10-CM | POA: Diagnosis not present

## 2023-01-02 NOTE — Assessment & Plan Note (Signed)
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 PET DEC 2023- Stable PET-CT findings. No recurrent lymphoma is identified [ [place] 1].  Monitor clinical basis.  Stable  # Secondary immunodeficiency -multiple infections [UTI/resipiratory; COVID] s/p IVIG x3-  DEC 2023-IgG-260  [trough goal: >400- 500].  JAN 2024- IgG- 466- Proceed with IVIG infusion monthly x 1 more. Pre-meds: Continue Dex; Tylenol; benadryl 50 night pror.  Also recommend Tylenol; benadryl 50 night  x3 days after infusion.  Continue prophylactic antibiotics- Bactrim M/W/F; valtrex 500 mg/day- see below.  Stable   # Skin rash postinfusion-no evidence of any anaphylactic reaction.  Also recommend Tylenol; benadryl 50 night  x3 days after infusion- see above.   # Mild neutropenia ANC 1.8 / Anemia-Hb 10-11-likely secondary to CAR-T therapy; no concern for any progressive disease. DEC 2023-ferritin 16 saturation 20-  continue gentle iron [iron biglycinate; 28 mg ] 1 pill a day three times a week [diarrhea]  # Prophylactic antibiotics with Valtrex and Bactrim [until CD 4 count > 200]. Repeat  CD4- JAN 2024-35- Refilled antibiotics.  prophylactic antibiotics- Bactrim M/W/F; valtrex 500 mg/day.  stable  # Peripheral neuropathy/left foot drop- stable  # ILD-  stable  # Mild Hypocalcemia- check vit D levels-  # Mediport-IV access-stable; port flush. stable  # DISPOSITION: # follow up in 2  month MD; 1 week prior- port/ labs- cbc/cmp; LDH;quantitative immunoglobulin; vit D 25-OH level; D-2 Possible IVIG infusion-- Dr.B

## 2023-01-02 NOTE — Progress Notes (Signed)
[facility] OFFICE PROGRESS NOTE  Patient Care Team: Danella Penton, [name] Earna Coder, MD as Consulting Physician (Hematology and Oncology) Stanton Kidney, MD as Consulting Physician (Gastroenterology)   Cancer Staging  No matching staging information was found for the patient.   Oncology History Overview Note  DEC 2017- DLBCL "TRIPLE HIT [myc/ bcl-2/bcl-6 gene rearrangement FISH]" ~10 cm mass RP LN; STAGE II [jan 2018- BMBx-NEG]; Jan 8th R-[facility];   # JAN 29th 2018- DA-R EPOCH x5 ; PET CR; s/p RT [last 03/18/2017]  # FEB-MARCH 2019- RECURRENCE of DLBCL [s/p RP Bx; 4 cm mass inferior to Left Kidney] ; II [facility]; Dr.Grover.   # April 3rd 2019- R-GDP [carbo]; MAY 2019- PET PR; NOV 2019- PR  #August 2020-recurrence bulky left pelvis/retroperitoneal [9-10 cm]; no biopsy. #August 24th-Rituxan weekly cycle #1; prednisone 60 mg a day.   06/03/2019 - Chemotherapy  BMT OP CAR-T LYMPHODEPLETION FLU/CY + IP AXICABTAGENE CILOLEUCEL (YESCARTA) Fludarabine 30 mg/m2 IV Days 1, 2, 3 Cyclophosphamide 500 mg/m2 IV Days 1, 2, 3 Axicabtagene ciloleucel target dose 2 x 10^6 CAR-positive viable T cells per kg body weight (maximum of 2 x 10^8 CAR-positive viable T cells) on Day 6.  # SEP 2020- 48month PET-[facility]- significant PR.   #August 2020-2D echo ejection fraction 60 to 65%  --------------------------------  # JAN 26th-LP  # Interstitial Lung disease [surveillance] --------------------------------------------------------------------------  DIAGNOSIS: [MARCH 2019 ] RECURRENT DLBCL  STAGE:   Recurrent ; GOALS: CURATIVE  CURRENT/MOST RECENT THERAPY: surveillaince   Diffuse large B-cell lymphoma of intra-abdominal lymph nodes (HCC)  04/13/2019 - 04/13/2019 Chemotherapy   The patient had riTUXimab-pvvr (RUXIENCE) 700 mg in sodium chloride 0.9 % 250 mL chemo infusion, , Intravenous, Once, 1 of 4 cycles Administration:  (04/13/2019)   for chemotherapy treatment.    05/07/2019 - 05/07/2019 Chemotherapy   The patient had riTUXimab-hyaluronidase human (RITUXAN HYCELA) 1400-23400 MG -UT/11.7ML injection SQ 1,400 mg, 1,400 mg, Subcutaneous,  Once, 1 of 4 cycles Administration: 1,400 mg (05/07/2019)  for chemotherapy treatment.      INTERVAL HISTORY: Patient is accompanied by husband. She ambulating independently.  [place] 77 y.o.  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  Patient had UTI 2 weeks ago- s/p Anti-biotics x7 days.  Patient denied any pneumonias.  No hospitalizations.  Patient has chronic mild diarrhea not any worse.  Stopped taking iron pills because of diarrhea.  Appetite is getting better.  Chronic mild shortness of breath not any worse.  Neuropathy is stable on the Gabapentin.  Denies any new lumps or bumps. No night sweats.  No worsening back pain.    Review of Systems  Constitutional:  Positive for malaise/fatigue. Negative for chills, diaphoresis, fever and weight loss.  HENT:  Negative for nosebleeds and sore throat.   Eyes:  Negative for double vision.  Respiratory:  Positive for shortness of breath. Negative for cough, hemoptysis, sputum production and wheezing.   Cardiovascular:  Negative for chest pain, palpitations, orthopnea and leg swelling.  Gastrointestinal:  Negative for abdominal pain, blood in stool, constipation, diarrhea, melena and vomiting.  Genitourinary:  Negative for dysuria, frequency and urgency.  Musculoskeletal:  Positive for joint pain.  Skin: Negative.  Negative for itching and rash.  Neurological:  Positive for tingling. Negative for focal weakness and  weakness.  Endo/Heme/Allergies:  Does not bruise/bleed easily.  Psychiatric/Behavioral:  Negative for depression. The  patient is not nervous/anxious and does not have insomnia.     PAST MEDICAL HISTORY :  Past Medical History:  Diagnosis Date   Diabetes mellitus without complication (HCC)    Diffuse large B-cell lymphoma (HCC)    Chemo + rad tx's   Dysplastic nevus 03/14/2020   left inf med scapula moderate atypia    Dysplastic nevus 03/14/2020   left lat breast parallel to areaola, moderate atypia    Heart murmur    History of chemotherapy 07/08/2017   History of gastric ulcer    History of radiation therapy 07/08/2017   Hypercholesterolemia    Hypertension    ILD (interstitial lung disease) (HCC)    8 yrs ago   Lymphadenopathy     PAST SURGICAL HISTORY :   Past Surgical History:  Procedure Laterality Date   BREAST BIOPSY Left 2010   neg   CATARACT EXTRACTION W/PHACO Left 10/16/2017   Procedure: CATARACT EXTRACTION PHACO AND INTRAOCULAR LENS PLACEMENT (IOC) COMPLICATED DIABETIC LEFT;  Surgeon: Lockie Mola, MD;  Location: [facility];  Service: Ophthalmology;  Laterality: Left;  IRIS HOOKS Diabetic - oral meds   COLONOSCOPY N/A 06/19/2021   Procedure: COLONOSCOPY;  Surgeon: Jaynie Collins, DO;  Location: [facility] ENDOSCOPY;  Service: Gastroenterology;  Laterality: N/A;   ESOPHAGOGASTRODUODENOSCOPY N/A 06/19/2021   Procedure: ESOPHAGOGASTRODUODENOSCOPY (EGD);  Surgeon: Jaynie Collins, DO;  Location: [facility] ENDOSCOPY;  Service: Gastroenterology;  Laterality: N/A;   HERNIA REPAIR     PARTIAL HYSTERECTOMY     age 87   PERIPHERAL VASCULAR CATHETERIZATION N/A 08/22/2016   Procedure: Shelda Pal Cath Insertion;  Surgeon: Renford Dills, MD;  Location: [facility] INVASIVE CV LAB;  Service: Cardiovascular;  Laterality: N/A;   TONSILLECTOMY      FAMILY HISTORY :   Family History  Problem Relation Age of Onset   Heart disease Mother    Heart disease Father    Heart disease Brother     SOCIAL HISTORY:   Social History   Tobacco Use   Smoking status: Former    Types:  Cigarettes    Quit date: 08/21/1958    Years since quitting: 64.4   Smokeless tobacco: Never  Vaping Use   Vaping Use: Never used  Substance Use Topics   Alcohol use: No   Drug use: No    ALLERGIES:  is allergic to levaquin [levofloxacin in d5w], cefuroxime axetil, and doxycycline.  MEDICATIONS:  Current Outpatient Medications  Medication Sig Dispense Refill   acetaminophen (TYLENOL) 325 MG tablet Take by mouth.     b complex vitamins tablet Take 1 tablet by mouth daily.     buPROPion (WELLBUTRIN XL) 150 MG 24 hr tablet Take 150 mg by mouth at bedtime.     dexamethasone (DECADRON) 4 MG tablet Take 1 tablet (4 mg total) by mouth 2 (two) times daily. For 1 day; prior to infusion. 30 tablet 0   gabapentin (NEURONTIN) 300 MG capsule Take 1 capsule (300 mg total) by mouth 2 (two) times daily. (Patient taking differently: Take 300 mg by mouth daily.) 60 capsule 3   ibuprofen (ADVIL) 200 MG tablet Take by mouth.     latanoprost (XALATAN) 0.005 % ophthalmic solution Place 1 drop into both eyes at bedtime.      Magnesium 400 MG TABS Take 1 tablet by mouth daily.     metoprolol tartrate (LOPRESSOR) 25 MG tablet TAKE 1 TABLET  BY MOUTH TWICE DAILY (Patient taking differently: 12.5 mg 2 (two) times daily. (BETA BLOCKER)) 180 tablet 0   montelukast (SINGULAIR) 10 MG tablet Take 1 tablet (10 mg total) by mouth at bedtime. One a day. 30 tablet 11   pantoprazole (PROTONIX) 40 MG tablet Take 1 tablet by mouth in the morning and at bedtime.     pravastatin (PRAVACHOL) 40 MG tablet Take 20 mg by mouth daily.     sulfamethoxazole-trimethoprim (BACTRIM DS) 800-160 MG tablet Take 1 tablet by mouth 3 (three) times a week. Takes on Mondays, weds, and fridays     valACYclovir (VALTREX) 500 MG tablet Take 1 tablet by mouth once daily 30 tablet 0   ondansetron (ZOFRAN) 8 MG tablet Take by mouth. (Patient not taking: Reported on 10/26/2022)     prochlorperazine (COMPAZINE) 10 MG tablet Take 10 mg by mouth every 6  (six) hours as needed for nausea or vomiting. (Patient not taking: Reported on 10/26/2022)     No current facility-administered medications for this visit.   Facility-Administered Medications Ordered in Other Visits  Medication Dose Route Frequency Provider Last Rate Last Admin   0.9 %  sodium chloride infusion   Intravenous Continuous Earna Coder, MD 10 mL/hr at 06/19/21 1230 Continued from Pre-op at 06/19/21 1230   0.9 %  sodium chloride infusion   Intravenous Continuous Earna Coder, MD   Stopped at 12/26/16 1011   0.9 %  sodium chloride infusion   Intravenous Continuous Louretta Shorten R, MD 10 mL/hr at 12/27/16 0930 New Bag at 12/27/16 0930   heparin lock flush 100 unit/mL  500 Units Intravenous Once Louretta Shorten R, MD       heparin lock flush 100 unit/mL  500 Units Intravenous Once Louretta Shorten R, MD       sodium chloride flush (NS) 0.9 % injection 10 mL  10 mL Intravenous PRN Louretta Shorten R, MD   10 mL at 11/06/16 1000   sodium chloride flush (NS) 0.9 % injection 10 mL  10 mL Intravenous PRN Earna Coder, MD   10 mL at 11/07/16 0905   sodium chloride flush (NS) 0.9 % injection 10 mL  10 mL Intravenous PRN Earna Coder, MD       sodium chloride flush (NS) 0.9 % injection 10 mL  10 mL Intravenous PRN Louretta Shorten R, MD   10 mL at 12/05/16 1030   sodium chloride flush (NS) 0.9 % injection 10 mL  10 mL Intravenous PRN Louretta Shorten R, MD   10 mL at 12/27/16 0930   sodium chloride flush (NS) 0.9 % injection 10 mL  10 mL Intravenous PRN Earna Coder, MD   10 mL at 09/27/20 1305    PHYSICAL EXAMINATION: ECOG PERFORMANCE STATUS: 1 - Symptomatic but completely ambulatory  BP 133/60 (BP Location: Left Arm, Patient Position: Sitting, Cuff Size: Normal)   Pulse 66   Temp 98.6 F (37 C) (Tympanic)   Ht 5\' 7"  (1.702 m)   Wt 136 lb 6.4 oz (61.9 kg)   SpO2 98%   BMI 21.36 kg/m   Filed Weights   01/02/23 1005   Weight: 136 lb 6.4 oz (61.9 kg)    Tenderness left maxillary sinus area  Physical Exam HENT:     Head: Normocephalic and atraumatic.     Mouth/Throat:     Pharynx: No oropharyngeal exudate.  Eyes:     Pupils: Pupils are equal, round, and reactive to light.  Cardiovascular:  Rate and Rhythm: Normal rate and regular rhythm.  Pulmonary:     Effort: No respiratory distress.     Breath sounds: No wheezing.     Comments: Bilateral crackles at the bases.  No wheeze. Abdominal:     General: Bowel sounds are normal. There is no distension.     Palpations: Abdomen is soft. There is no mass.     Tenderness: There is no abdominal tenderness. There is no guarding or rebound.  Musculoskeletal:        General: No tenderness. Normal range of motion.     Cervical back: Normal range of motion and neck supple.  Skin:    General: Skin is warm.  Neurological:     Mental Status: She is alert and oriented to person, place, and time.  Psychiatric:        Mood and Affect: Affect normal.    LABORATORY DATA:  I have reviewed the data as listed    Component Value Date/Time   NA 138 12/24/2022 1018   K 3.5 12/24/2022 1018   K 4.1 07/01/2014 1429   CL 105 12/24/2022 1018   CO2 24 12/24/2022 1018   GLUCOSE 160 (H) 12/24/2022 1018   BUN 12 12/24/2022 1018   CREATININE 0.66 12/24/2022 1018   CALCIUM 8.6 (L) 12/24/2022 1018   PROT 6.2 (L) 12/24/2022 1018   ALBUMIN 3.7 12/24/2022 1018   AST 35 12/24/2022 1018   ALT 20 12/24/2022 1018   ALKPHOS 53 12/24/2022 1018   BILITOT 0.3 12/24/2022 1018   GFRNONAA >60 12/24/2022 1018   GFRAA >60 03/28/2020 1305    No results found for: "SPEP", "UPEP"  Lab Results  Component Value Date   WBC 2.8 (L) 12/24/2022   NEUTROABS 1.8 12/24/2022   HGB 10.9 (L) 12/24/2022   HCT 34.1 (L) 12/24/2022   MCV 90.9 12/24/2022   PLT 198 12/24/2022      Chemistry      Component Value Date/Time   NA 138 12/24/2022 1018   K 3.5 12/24/2022 1018   K 4.1  07/01/2014 1429   CL 105 12/24/2022 1018   CO2 24 12/24/2022 1018   BUN 12 12/24/2022 1018   CREATININE 0.66 12/24/2022 1018      Component Value Date/Time   CALCIUM 8.6 (L) 12/24/2022 1018   ALKPHOS 53 12/24/2022 1018   AST 35 12/24/2022 1018   ALT 20 12/24/2022 1018   BILITOT 0.3 12/24/2022 1018       RADIOGRAPHIC STUDIES: I have personally reviewed the radiological images as listed and agreed with the findings in the report. No results found.   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 PET DEC 2023- Stable PET-CT findings. No recurrent lymphoma is identified [ [place] 1].  Monitor clinical basis.  Stable  # Secondary immunodeficiency -multiple infections [UTI/resipiratory; COVID] s/p IVIG x3-  DEC 2023-IgG-260  [trough goal: >400- 500].  JAN 2024- IgG- 466- Proceed with IVIG infusion monthly x 1 more. Pre-meds: Continue Dex; Tylenol; benadryl 50 night pror.  Also recommend Tylenol; benadryl 50 night  x3 days after infusion.  Continue prophylactic antibiotics- Bactrim M/W/F; valtrex 500 mg/day- see below.  Stable   # Skin rash postinfusion-no evidence of any anaphylactic reaction.  Also recommend Tylenol; benadryl 50 night  x3 days after infusion- see above.   # Mild neutropenia ANC 1.8 / Anemia-Hb 10-11-likely  secondary to CAR-T therapy; no concern for any progressive disease. DEC 2023-ferritin 16 saturation 20-  continue gentle iron [iron biglycinate; 28 mg ] 1 pill a day three times a week [diarrhea]  # Prophylactic antibiotics with Valtrex and Bactrim [until CD 4 count > 200]. Repeat  CD4- JAN 2024-35- Refilled antibiotics.  prophylactic antibiotics- Bactrim M/W/F; valtrex 500 mg/day.  stable  # Peripheral neuropathy/left foot drop- stable  # ILD-  stable  # Mild Hypocalcemia- check vit D levels-  # Mediport-IV  access-stable; port flush. stable  # DISPOSITION: # follow up in 2  month MD; 1 week prior- port/ labs- cbc/cmp; LDH;quantitative immunoglobulin; vit D 25-OH level; D-2 Possible IVIG infusion-- Dr.B    No orders of the defined types were placed in this encounter.  All questions were answered. The patient knows to call the clinic with any problems, questions or concerns.      Earna Coder, MD 01/02/2023 10:31 AM

## 2023-01-02 NOTE — Progress Notes (Signed)
No concerns today 

## 2023-01-05 ENCOUNTER — Other Ambulatory Visit: Payer: Self-pay | Admitting: Internal Medicine

## 2023-01-07 ENCOUNTER — Encounter: Payer: Self-pay | Admitting: Internal Medicine

## 2023-02-18 ENCOUNTER — Inpatient Hospital Stay: Payer: Medicare PPO | Attending: Internal Medicine

## 2023-02-18 DIAGNOSIS — D649 Anemia, unspecified: Secondary | ICD-10-CM | POA: Diagnosis present

## 2023-02-18 DIAGNOSIS — J849 Interstitial pulmonary disease, unspecified: Secondary | ICD-10-CM | POA: Insufficient documentation

## 2023-02-18 DIAGNOSIS — R350 Frequency of micturition: Secondary | ICD-10-CM | POA: Insufficient documentation

## 2023-02-18 DIAGNOSIS — Z7952 Long term (current) use of systemic steroids: Secondary | ICD-10-CM | POA: Diagnosis not present

## 2023-02-18 DIAGNOSIS — Z8572 Personal history of non-Hodgkin lymphomas: Secondary | ICD-10-CM | POA: Insufficient documentation

## 2023-02-18 DIAGNOSIS — D849 Immunodeficiency, unspecified: Secondary | ICD-10-CM | POA: Insufficient documentation

## 2023-02-18 DIAGNOSIS — G629 Polyneuropathy, unspecified: Secondary | ICD-10-CM | POA: Diagnosis not present

## 2023-02-18 DIAGNOSIS — Z79899 Other long term (current) drug therapy: Secondary | ICD-10-CM | POA: Diagnosis not present

## 2023-02-18 DIAGNOSIS — C8333 Diffuse large B-cell lymphoma, intra-abdominal lymph nodes: Secondary | ICD-10-CM

## 2023-02-18 DIAGNOSIS — M2550 Pain in unspecified joint: Secondary | ICD-10-CM | POA: Diagnosis not present

## 2023-02-18 DIAGNOSIS — R197 Diarrhea, unspecified: Secondary | ICD-10-CM | POA: Insufficient documentation

## 2023-02-18 DIAGNOSIS — Z923 Personal history of irradiation: Secondary | ICD-10-CM | POA: Insufficient documentation

## 2023-02-18 DIAGNOSIS — Z87891 Personal history of nicotine dependence: Secondary | ICD-10-CM | POA: Diagnosis not present

## 2023-02-18 LAB — CBC WITH DIFFERENTIAL (CANCER CENTER ONLY)
Abs Immature Granulocytes: 0.06 10*3/uL (ref 0.00–0.07)
Basophils Absolute: 0 10*3/uL (ref 0.0–0.1)
Basophils Relative: 1 %
Eosinophils Absolute: 0.3 10*3/uL (ref 0.0–0.5)
Eosinophils Relative: 8 %
HCT: 34.6 % — ABNORMAL LOW (ref 36.0–46.0)
Hemoglobin: 11.2 g/dL — ABNORMAL LOW (ref 12.0–15.0)
Immature Granulocytes: 2 %
Lymphocytes Relative: 9 %
Lymphs Abs: 0.3 10*3/uL — ABNORMAL LOW (ref 0.7–4.0)
MCH: 29.3 pg (ref 26.0–34.0)
MCHC: 32.4 g/dL (ref 30.0–36.0)
MCV: 90.6 fL (ref 80.0–100.0)
Monocytes Absolute: 0.5 10*3/uL (ref 0.1–1.0)
Monocytes Relative: 15 %
Neutro Abs: 2.1 10*3/uL (ref 1.7–7.7)
Neutrophils Relative %: 65 %
Platelet Count: 195 10*3/uL (ref 150–400)
RBC: 3.82 MIL/uL — ABNORMAL LOW (ref 3.87–5.11)
RDW: 15 % (ref 11.5–15.5)
WBC Count: 3.2 10*3/uL — ABNORMAL LOW (ref 4.0–10.5)
nRBC: 0 % (ref 0.0–0.2)

## 2023-02-18 LAB — CMP (CANCER CENTER ONLY)
ALT: 15 U/L (ref 0–44)
AST: 22 U/L (ref 15–41)
Albumin: 3.9 g/dL (ref 3.5–5.0)
Alkaline Phosphatase: 60 U/L (ref 38–126)
Anion gap: 8 (ref 5–15)
BUN: 11 mg/dL (ref 8–23)
CO2: 26 mmol/L (ref 22–32)
Calcium: 8.8 mg/dL — ABNORMAL LOW (ref 8.9–10.3)
Chloride: 104 mmol/L (ref 98–111)
Creatinine: 0.65 mg/dL (ref 0.44–1.00)
GFR, Estimated: >60 mL/min (ref >60)
Glucose, Bld: 119 mg/dL — ABNORMAL HIGH (ref 70–99)
Potassium: 3.8 mmol/L (ref 3.5–5.1)
Sodium: 138 mmol/L (ref 135–145)
Total Bilirubin: 0.5 mg/dL (ref 0.3–1.2)
Total Protein: 6.5 g/dL (ref 6.5–8.1)

## 2023-02-18 LAB — LACTATE DEHYDROGENASE: LDH: 179 U/L (ref 98–192)

## 2023-02-18 LAB — VITAMIN D 25 HYDROXY (VIT D DEFICIENCY, FRACTURES): Vit D, 25-Hydroxy: 55.17 ng/mL (ref 30–100)

## 2023-02-19 LAB — T-HELPER CELLS CD4/CD8 %
% CD 4 Pos. Lymph.: 37.5 % (ref 30.8–58.5)
Absolute CD 4 Helper: 113 /uL — ABNORMAL LOW (ref 359–1519)
Basophils Absolute: 0 10*3/uL (ref 0.0–0.2)
Basos: 1 %
CD3+CD4+ Cells/CD3+CD8+ Cells Bld: 0.94 (ref 0.92–3.72)
CD3+CD8+ Cells # Bld: 120 /uL (ref 109–897)
CD3+CD8+ Cells NFr Bld: 39.9 % — ABNORMAL HIGH (ref 12.0–35.5)
EOS (ABSOLUTE): 0.2 10*3/uL (ref 0.0–0.4)
Eos: 8 %
Hematocrit: 35.4 % (ref 34.0–46.6)
Hemoglobin: 11.1 g/dL (ref 11.1–15.9)
Immature Grans (Abs): 0.1 10*3/uL (ref 0.0–0.1)
Immature Granulocytes: 3 %
Lymphocytes Absolute: 0.3 10*3/uL — ABNORMAL LOW (ref 0.7–3.1)
Lymphs: 10 %
MCH: 28.5 pg (ref 26.6–33.0)
MCHC: 31.4 g/dL — ABNORMAL LOW (ref 31.5–35.7)
MCV: 91 fL (ref 79–97)
Monocytes Absolute: 0.5 10*3/uL (ref 0.1–0.9)
Monocytes: 15 %
Neutrophils Absolute: 2 10*3/uL (ref 1.4–7.0)
Neutrophils: 63 %
Platelets: 206 10*3/uL (ref 150–450)
RBC: 3.89 x10E6/uL (ref 3.77–5.28)
RDW: 15.1 % (ref 11.7–15.4)
WBC: 3.1 10*3/uL — ABNORMAL LOW (ref 3.4–10.8)

## 2023-02-22 LAB — IMMUNOGLOBULINS A/E/G/M, SERUM
IgA: 18 mg/dL — ABNORMAL LOW (ref 64–422)
IgE (Immunoglobulin E), Serum: <2 IU/mL — ABNORMAL LOW (ref 6–495)
IgG (Immunoglobin G), Serum: 280 mg/dL — ABNORMAL LOW (ref 586–1602)
IgM (Immunoglobulin M), Srm: <5 mg/dL — ABNORMAL LOW (ref 26–217)

## 2023-03-04 ENCOUNTER — Inpatient Hospital Stay: Payer: Medicare PPO

## 2023-03-04 ENCOUNTER — Encounter: Payer: Self-pay | Admitting: Internal Medicine

## 2023-03-04 ENCOUNTER — Inpatient Hospital Stay: Payer: Medicare PPO | Admitting: Internal Medicine

## 2023-03-04 VITALS — BP 130/51 | HR 63 | Temp 97.5°F | Resp 16 | Wt 135.5 lb

## 2023-03-04 VITALS — BP 146/58 | HR 70 | Temp 97.0°F | Ht 67.0 in | Wt 135.8 lb

## 2023-03-04 DIAGNOSIS — E860 Dehydration: Secondary | ICD-10-CM

## 2023-03-04 DIAGNOSIS — R350 Frequency of micturition: Secondary | ICD-10-CM

## 2023-03-04 DIAGNOSIS — C8333 Diffuse large B-cell lymphoma, intra-abdominal lymph nodes: Secondary | ICD-10-CM

## 2023-03-04 DIAGNOSIS — D849 Immunodeficiency, unspecified: Secondary | ICD-10-CM | POA: Diagnosis not present

## 2023-03-04 LAB — CBC WITH DIFFERENTIAL (CANCER CENTER ONLY)
Abs Immature Granulocytes: 0.1 10*3/uL — ABNORMAL HIGH (ref 0.00–0.07)
Basophils Absolute: 0 10*3/uL (ref 0.0–0.1)
Basophils Relative: 0 %
Eosinophils Absolute: 0 10*3/uL (ref 0.0–0.5)
Eosinophils Relative: 0 %
HCT: 32.8 % — ABNORMAL LOW (ref 36.0–46.0)
Hemoglobin: 10.7 g/dL — ABNORMAL LOW (ref 12.0–15.0)
Lymphocytes Relative: 11 %
Lymphs Abs: 0.4 10*3/uL — ABNORMAL LOW (ref 0.7–4.0)
MCH: 29 pg (ref 26.0–34.0)
MCHC: 32.6 g/dL (ref 30.0–36.0)
MCV: 88.9 fL (ref 80.0–100.0)
Metamyelocytes Relative: 3 %
Monocytes Absolute: 0.1 10*3/uL (ref 0.1–1.0)
Monocytes Relative: 3 %
Neutro Abs: 3.2 10*3/uL (ref 1.7–7.7)
Neutrophils Relative %: 83 %
Platelet Count: 235 10*3/uL (ref 150–400)
RBC: 3.69 MIL/uL — ABNORMAL LOW (ref 3.87–5.11)
RDW: 14.6 % (ref 11.5–15.5)
Smear Review: NORMAL
WBC Count: 3.9 10*3/uL — ABNORMAL LOW (ref 4.0–10.5)
nRBC: 0 % (ref 0.0–0.2)

## 2023-03-04 LAB — URINALYSIS, COMPLETE (UACMP) WITH MICROSCOPIC
Bilirubin Urine: NEGATIVE
Glucose, UA: NEGATIVE mg/dL
Ketones, ur: NEGATIVE mg/dL
Nitrite: POSITIVE — AB
Protein, ur: NEGATIVE mg/dL
Specific Gravity, Urine: 1.006 (ref 1.005–1.030)
WBC, UA: >50 WBC/hpf (ref 0–5)
pH: 6 (ref 5.0–8.0)

## 2023-03-04 LAB — CMP (CANCER CENTER ONLY)
ALT: 14 U/L (ref 0–44)
AST: 21 U/L (ref 15–41)
Albumin: 3.7 g/dL (ref 3.5–5.0)
Alkaline Phosphatase: 56 U/L (ref 38–126)
Anion gap: 8 (ref 5–15)
BUN: 16 mg/dL (ref 8–23)
CO2: 23 mmol/L (ref 22–32)
Calcium: 8.6 mg/dL — ABNORMAL LOW (ref 8.9–10.3)
Chloride: 105 mmol/L (ref 98–111)
Creatinine: 0.69 mg/dL (ref 0.44–1.00)
GFR, Estimated: >60 mL/min (ref >60)
Glucose, Bld: 201 mg/dL — ABNORMAL HIGH (ref 70–99)
Potassium: 3.1 mmol/L — ABNORMAL LOW (ref 3.5–5.1)
Sodium: 136 mmol/L (ref 135–145)
Total Bilirubin: <0.1 mg/dL — ABNORMAL LOW (ref 0.3–1.2)
Total Protein: 6.3 g/dL — ABNORMAL LOW (ref 6.5–8.1)

## 2023-03-04 MED ORDER — DEXTROSE 5 % IV SOLN
Freq: Once | INTRAVENOUS | Status: AC
  Filled 2023-03-04: qty 250

## 2023-03-04 MED ORDER — ACETAMINOPHEN 325 MG PO TABS
650.0000 mg | ORAL_TABLET | Freq: Once | ORAL | Status: AC
  Administered 2023-03-04: 650 mg via ORAL
  Filled 2023-03-04: qty 2

## 2023-03-04 MED ORDER — MONTELUKAST SODIUM 10 MG PO TABS
10.0000 mg | ORAL_TABLET | Freq: Once | ORAL | Status: AC
  Administered 2023-03-04: 10 mg via ORAL
  Filled 2023-03-04: qty 1

## 2023-03-04 MED ORDER — DIPHENHYDRAMINE HCL 25 MG PO CAPS
50.0000 mg | ORAL_CAPSULE | Freq: Once | ORAL | Status: AC
  Administered 2023-03-04: 50 mg via ORAL
  Filled 2023-03-04: qty 2

## 2023-03-04 MED ORDER — SODIUM CHLORIDE 0.9 % IV SOLN
10.0000 mg | Freq: Once | INTRAVENOUS | Status: AC
  Administered 2023-03-04: 10 mg via INTRAVENOUS
  Filled 2023-03-04: qty 10

## 2023-03-04 MED ORDER — HEPARIN SOD (PORK) LOCK FLUSH 100 UNIT/ML IV SOLN
500.0000 [IU] | Freq: Once | INTRAVENOUS | Status: AC | PRN
  Administered 2023-03-04: 500 [IU]
  Filled 2023-03-04: qty 5

## 2023-03-04 MED ORDER — IMMUNE GLOBULIN (HUMAN) 10 GM/100ML IV SOLN
400.0000 mg/kg | Freq: Once | INTRAVENOUS | Status: AC
  Administered 2023-03-04: 25 g via INTRAVENOUS
  Filled 2023-03-04: qty 250

## 2023-03-04 NOTE — Progress Notes (Signed)
[facility] OFFICE PROGRESS NOTE  Patient Care Team: Danella Penton, [name] Earna Coder, MD as Consulting Physician (Hematology and Oncology) Stanton Kidney, MD as Consulting Physician (Gastroenterology)   Cancer Staging  No matching staging information was found for the patient.   Oncology History Overview Note  DEC 2017- DLBCL "TRIPLE HIT [myc/ bcl-2/bcl-6 gene rearrangement FISH]" ~10 cm mass RP LN; STAGE II [jan 2018- BMBx-NEG]; Jan 8th R-[facility]OP;   # JAN 29th 2018- DA-R EPOCH x5 ; PET CR; s/p RT [last 03/18/2017]  # FEB-MARCH 2019- RECURRENCE of DLBCL [s/p RP Bx; 4 cm mass inferior to Left Kidney] ; II [facility]; Dr.Grover.   # April 3rd 2019- R-GDP [carbo]; MAY 2019- PET PR; NOV 2019- PR  #August 2020-recurrence bulky left pelvis/retroperitoneal [9-10 cm]; no biopsy. #August 24th-Rituxan weekly cycle #1; prednisone 60 mg a day.   06/03/2019 - Chemotherapy  BMT OP CAR-T LYMPHODEPLETION FLU/CY + IP AXICABTAGENE CILOLEUCEL (YESCARTA) Fludarabine 30 mg/m2 IV Days 1, 2, 3 Cyclophosphamide 500 mg/m2 IV Days 1, 2, 3 Axicabtagene ciloleucel target dose 2 x 10^6 CAR-positive viable T cells per kg body weight (maximum of 2 x 10^8 CAR-positive viable T cells) on Day 6.  # SEP 2020- 38month PET-[facility]- significant PR.   #August 2020-2D echo ejection fraction 60 to 65%  --------------------------------  # JAN 26th-LP  # Interstitial Lung disease [surveillance] --------------------------------------------------------------------------  DIAGNOSIS: [MARCH 2019 ] RECURRENT DLBCL  STAGE:   Recurrent ; GOALS: CURATIVE  CURRENT/MOST RECENT THERAPY: surveillaince   Diffuse large B-cell lymphoma of intra-abdominal lymph nodes (HCC)  04/13/2019 - 04/13/2019 Chemotherapy   The patient had riTUXimab-pvvr (RUXIENCE) 700 mg in sodium chloride 0.9 % 250 mL chemo infusion, , Intravenous, Once, 1 of 4 cycles Administration:  (04/13/2019)   for chemotherapy treatment.    05/07/2019 - 05/07/2019 Chemotherapy   The patient had riTUXimab-hyaluronidase human (RITUXAN HYCELA) 1400-23400 MG -UT/11.7ML injection SQ 1,400 mg, 1,400 mg, Subcutaneous,  Once, 1 of 4 cycles Administration: 1,400 mg (05/07/2019)  for chemotherapy treatment.     INTERVAL HISTORY: Patient is alone. She ambulating independently.  [place] 77 y.o.  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  Patient complains of increased frequency of urination. No pain. No fevers or chills or back pain.   Patient denied any pneumonias.  No hospitalizations.  Patient has chronic mild diarrhea not any worse.  Stopped taking iron pills because of diarrhea.  Appetite is getting better.  Chronic mild shortness of breath not any worse.  Neuropathy is stable on the Gabapentin.  Denies any new lumps or bumps. No night sweats.  Review of Systems  Constitutional:  Positive for malaise/fatigue. Negative for chills, diaphoresis, fever and weight loss.  HENT:  Negative for nosebleeds and sore throat.   Eyes:  Negative for double vision.  Respiratory:  Positive for shortness of breath. Negative for cough, hemoptysis, sputum production and wheezing.   Cardiovascular:  Negative for chest pain, palpitations, orthopnea and leg swelling.  Gastrointestinal:  Negative for abdominal pain, blood in stool, constipation, diarrhea, melena and vomiting.  Genitourinary:  Negative for dysuria, frequency and urgency.  Musculoskeletal:  Positive for joint pain.  Skin: Negative.  Negative for itching and rash.  Neurological:  Positive for tingling. Negative for focal weakness and weakness.  Endo/Heme/Allergies:  Does not bruise/bleed easily.  Psychiatric/Behavioral:  Negative for depression. The  patient is not nervous/anxious and does not have insomnia.     PAST MEDICAL HISTORY :  Past Medical History:  Diagnosis Date   Diabetes mellitus without complication (HCC)    Diffuse large B-cell lymphoma (HCC)    Chemo + rad tx's   Dysplastic nevus 03/14/2020   left inf med scapula moderate atypia    Dysplastic nevus 03/14/2020   left lat breast parallel to areaola, moderate atypia    Heart murmur    History of chemotherapy 07/08/2017   History of gastric ulcer    History of radiation therapy 07/08/2017   Hypercholesterolemia    Hypertension    ILD (interstitial lung disease) (HCC)    8 yrs ago   Lymphadenopathy     PAST SURGICAL HISTORY :   Past Surgical History:  Procedure Laterality Date   BREAST BIOPSY Left 2010   neg   CATARACT EXTRACTION W/PHACO Left 10/16/2017   Procedure: CATARACT EXTRACTION PHACO AND INTRAOCULAR LENS PLACEMENT (IOC) COMPLICATED DIABETIC LEFT;  Surgeon: Lockie Mola, MD;  Location: [facility];  Service: Ophthalmology;  Laterality: Left;  IRIS HOOKS Diabetic - oral meds   COLONOSCOPY N/A 06/19/2021   Procedure: COLONOSCOPY;  Surgeon: Jaynie Collins, DO;  Location: [facility];  Service: Gastroenterology;  Laterality: N/A;   ESOPHAGOGASTRODUODENOSCOPY N/A 06/19/2021   Procedure: ESOPHAGOGASTRODUODENOSCOPY (EGD);  Surgeon: Jaynie Collins, DO;  Location: [facility];  Service: Gastroenterology;  Laterality: N/A;   HERNIA REPAIR     PARTIAL HYSTERECTOMY     age 46   PERIPHERAL VASCULAR CATHETERIZATION N/A 08/22/2016   Procedure: Shelda Pal Cath Insertion;  Surgeon: Renford Dills, MD;  Location: [facility] INVASIVE CV LAB;  Service: Cardiovascular;  Laterality: N/A;   TONSILLECTOMY      FAMILY HISTORY :   Family History  Problem Relation Age of Onset   Heart disease Mother    Heart disease Father    Heart disease Brother     SOCIAL HISTORY:   Social History   Tobacco Use   Smoking status: Former    Current  packs/day: 0.00    Types: Cigarettes    Quit date: 08/21/1958    Years since quitting: 64.5   Smokeless tobacco: Never  Vaping Use   Vaping status: Never Used  Substance Use Topics   Alcohol use: No   Drug use: No    ALLERGIES:  is allergic to levaquin [levofloxacin in d5w], cefuroxime axetil, and doxycycline.  MEDICATIONS:  Current Outpatient Medications  Medication Sig Dispense Refill   acetaminophen (TYLENOL) 325 MG tablet Take by mouth.     b complex vitamins tablet Take 1 tablet by mouth daily.     buPROPion (WELLBUTRIN XL) 150 MG 24 hr tablet Take 150 mg by mouth at bedtime.     dexamethasone (DECADRON) 4 MG tablet Take 1 tablet (4 mg total) by mouth 2 (two) times daily. For 1 day; prior to infusion. 30 tablet 0   gabapentin (NEURONTIN) 300 MG capsule Take 1 capsule (300 mg total) by mouth 2 (two) times daily. (Patient taking differently: Take 300 mg by mouth daily.) 60 capsule 3   ibuprofen (ADVIL) 200 MG tablet Take by mouth.     latanoprost (XALATAN) 0.005 % ophthalmic solution Place 1 drop into both eyes at bedtime.      Magnesium 400 MG TABS Take 1 tablet by mouth daily.     metoprolol tartrate (LOPRESSOR) 25 MG tablet TAKE  1 TABLET BY MOUTH TWICE DAILY (Patient taking differently: 12.5 mg 2 (two) times daily. (BETA BLOCKER)) 180 tablet 0   montelukast (SINGULAIR) 10 MG tablet Take 1 tablet (10 mg total) by mouth at bedtime. One a day. 30 tablet 11   ondansetron (ZOFRAN) 8 MG tablet Take by mouth.     pantoprazole (PROTONIX) 40 MG tablet Take 1 tablet by mouth in the morning and at bedtime.     pravastatin (PRAVACHOL) 40 MG tablet Take 20 mg by mouth daily.     prochlorperazine (COMPAZINE) 10 MG tablet Take 10 mg by mouth every 6 (six) hours as needed for nausea or vomiting.     sulfamethoxazole-trimethoprim (BACTRIM DS) 800-160 MG tablet Take 1 tablet by mouth 3 (three) times a week. Takes on Mondays, weds, and fridays     valACYclovir (VALTREX) 500 MG tablet Take 1 tablet  by mouth once daily 30 tablet 0   No current facility-administered medications for this visit.   Facility-Administered Medications Ordered in Other Visits  Medication Dose Route Frequency Provider Last Rate Last Admin   0.9 %  sodium chloride infusion   Intravenous Continuous Earna Coder, MD 10 mL/hr at 06/19/21 1230 Continued from Pre-op at 06/19/21 1230   0.9 %  sodium chloride infusion   Intravenous Continuous Earna Coder, MD   Stopped at 12/26/16 1011   0.9 %  sodium chloride infusion   Intravenous Continuous Earna Coder, MD 10 mL/hr at 12/27/16 0930 New Bag at 12/27/16 0930   heparin lock flush 100 unit/mL  500 Units Intravenous Once Louretta Shorten R, MD       heparin lock flush 100 unit/mL  500 Units Intravenous Once Earna Coder, MD       Immune Globulin 10% (PRIVIGEN) IV infusion 25 g  400 mg/kg (Ideal) Intravenous Once Earna Coder, MD       sodium chloride flush (NS) 0.9 % injection 10 mL  10 mL Intravenous PRN Louretta Shorten R, MD   10 mL at 11/06/16 1000   sodium chloride flush (NS) 0.9 % injection 10 mL  10 mL Intravenous PRN Earna Coder, MD   10 mL at 11/07/16 0905   sodium chloride flush (NS) 0.9 % injection 10 mL  10 mL Intravenous PRN Earna Coder, MD       sodium chloride flush (NS) 0.9 % injection 10 mL  10 mL Intravenous PRN Louretta Shorten R, MD   10 mL at 12/05/16 1030   sodium chloride flush (NS) 0.9 % injection 10 mL  10 mL Intravenous PRN Louretta Shorten R, MD   10 mL at 12/27/16 0930   sodium chloride flush (NS) 0.9 % injection 10 mL  10 mL Intravenous PRN Earna Coder, MD   10 mL at 09/27/20 1305    PHYSICAL EXAMINATION: ECOG PERFORMANCE STATUS: 1 - Symptomatic but completely ambulatory  BP (!) 146/58 (BP Location: Left Arm, Patient Position: Sitting, Cuff Size: Normal)   Pulse 70   Temp (!) 97 F (36.1 C) (Tympanic)   Ht 5\' 7"  (1.702 m)   Wt 135 lb 12.8 oz (61.6 kg)    SpO2 98%   BMI 21.27 kg/m   Filed Weights   03/04/23 0836  Weight: 135 lb 12.8 oz (61.6 kg)     Tenderness left maxillary sinus area  Physical Exam HENT:     Head: Normocephalic and atraumatic.     Mouth/Throat:     Pharynx: No oropharyngeal exudate.  Eyes:     Pupils: Pupils are equal, round, and reactive to light.  Cardiovascular:     Rate and Rhythm: Normal rate and regular rhythm.  Pulmonary:     Effort: No respiratory distress.     Breath sounds: No wheezing.     Comments: Bilateral crackles at the bases.  No wheeze. Abdominal:     General: Bowel sounds are normal. There is no distension.     Palpations: Abdomen is soft. There is no mass.     Tenderness: There is no abdominal tenderness. There is no guarding or rebound.  Musculoskeletal:        General: No tenderness. Normal range of motion.     Cervical back: Normal range of motion and neck supple.  Skin:    General: Skin is warm.  Neurological:     Mental Status: She is alert and oriented to person, place, and time.  Psychiatric:        Mood and Affect: Affect normal.    LABORATORY DATA:  I have reviewed the data as listed    Component Value Date/Time   NA 136 03/04/2023 0829   K 3.1 (L) 03/04/2023 0829   K 4.1 07/01/2014 1429   CL 105 03/04/2023 0829   CO2 23 03/04/2023 0829   GLUCOSE 201 (H) 03/04/2023 0829   BUN 16 03/04/2023 0829   CREATININE 0.69 03/04/2023 0829   CALCIUM 8.6 (L) 03/04/2023 0829   PROT 6.3 (L) 03/04/2023 0829   ALBUMIN 3.7 03/04/2023 0829   AST 21 03/04/2023 0829   ALT 14 03/04/2023 0829   ALKPHOS 56 03/04/2023 0829   BILITOT <0.1 (L) 03/04/2023 0829   GFRNONAA >60 03/04/2023 0829   GFRAA >60 03/28/2020 1305    No results found for: "SPEP", "UPEP"  Lab Results  Component Value Date   WBC 3.9 (L) 03/04/2023   NEUTROABS 3.2 03/04/2023   HGB 10.7 (L) 03/04/2023   HCT 32.8 (L) 03/04/2023   MCV 88.9 03/04/2023   PLT 235 03/04/2023      Chemistry      Component Value  Date/Time   NA 136 03/04/2023 0829   K 3.1 (L) 03/04/2023 0829   K 4.1 07/01/2014 1429   CL 105 03/04/2023 0829   CO2 23 03/04/2023 0829   BUN 16 03/04/2023 0829   CREATININE 0.69 03/04/2023 0829      Component Value Date/Time   CALCIUM 8.6 (L) 03/04/2023 0829   ALKPHOS 56 03/04/2023 0829   AST 21 03/04/2023 0829   ALT 14 03/04/2023 0829   BILITOT <0.1 (L) 03/04/2023 0829       RADIOGRAPHIC STUDIES: I have personally reviewed the radiological images as listed and agreed with the findings in the report. No results found.   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 PET DEC 2023- Stable PET-CT findings. No recurrent lymphoma is identified [ Deauville 1].  Monitor clinical basis.  Stable   # Secondary immunodeficiency -multiple infections [UTI/resipiratory; COVID] s/p IVIG x3-  DEC 2023-IgG-260  [trough goal: >400- 500].   Pre-meds: Continue Dex; Tylenol; benadryl 50 night pror.  Also recommend Tylenol; benadryl 50 night  x3 days after infusion.  Continue prophylactic antibiotics- Bactrim M/W/F; valtrex 500 mg/day- see below.  Stable   # Skin rash postinfusion-no evidence of any anaphylactic reaction.  Also recommend Tylenol; benadryl 50 night  x3 days  after infusion- see above.   # Mild Leucopenia/ intremittent neutropenia/lymphopenia- anemia-Hb 10-11-likely secondary to CAR-T therapy; no concern for any progressive disease. MAY 2024--ferritin 14 saturation-15%-  poor tolerance to gentle iron [iron biglycinate; 28 mg ]- recommend IV Venofer.   # Prophylactic antibiotics with Valtrex and Bactrim [until CD 4 count > 200]. Repeat  CD4- JULY 2024- 113-Refilled antibiotics.  prophylactic antibiotics- Bactrim M/W/F; valtrex 500 mg/day.   # increased frequency of urination. No pain. No fevrs or chills or back pain- check UA/ culture  #  Peripheral neuropathy/left foot drop- stable  # ILD-  stable  # Mild Hypocalcemia- MAY 2024 vit D levels-55- stable.   # Mediport-IV access-stable; port flush. stable  # DISPOSITION: # check UA/ culture today.  # IVIG infusion today # IV venofer weekly x2- start next week.  # follow up in 1 month MD; port/ labs- cbc/cmp; Possible IVIG infusion-- Dr.B    Orders Placed This Encounter  Procedures   CBC with Differential (Cancer Center Only)    Standing Status:   Future    Standing Expiration Date:   03/03/2024   CMP (Cancer Center only)    Standing Status:   Future    Standing Expiration Date:   03/03/2024   All questions were answered. The patient knows to call the clinic with any problems, questions or concerns.      Earna Coder, MD 03/04/2023 10:09 AM

## 2023-03-04 NOTE — Addendum Note (Signed)
Addended by: Clydia Llano on: 03/04/2023 10:13 AM   Modules accepted: Orders

## 2023-03-04 NOTE — Progress Notes (Signed)
No concerns today 

## 2023-03-04 NOTE — Assessment & Plan Note (Addendum)
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 PET DEC 2023- Stable PET-CT findings. No recurrent lymphoma is identified [ Deauville 1].  Monitor clinical basis.  Stable   # Secondary immunodeficiency -multiple infections [UTI/resipiratory; COVID] s/p IVIG x3-  DEC 2023-IgG-260  [trough goal: >400- 500].   Pre-meds: Continue Dex; Tylenol; benadryl 50 night pror.  Also recommend Tylenol; benadryl 50 night  x3 days after infusion.  Continue prophylactic antibiotics- Bactrim M/W/F; valtrex 500 mg/day- see below.  Stable   # Skin rash postinfusion-no evidence of any anaphylactic reaction.  Also recommend Tylenol; benadryl 50 night  x3 days after infusion- see above.   # Mild Leucopenia/ intremittent neutropenia/lymphopenia- anemia-Hb 10-11-likely secondary to CAR-T therapy; no concern for any progressive disease. MAY 2024--ferritin 14 saturation-15%-  poor tolerance to gentle iron [iron biglycinate; 28 mg ]- recommend IV Venofer.   # Prophylactic antibiotics with Valtrex and Bactrim [until CD 4 count > 200]. Repeat  CD4- JULY 2024- 113-Refilled antibiotics.  prophylactic antibiotics- Bactrim M/W/F; valtrex 500 mg/day.   # increased frequency of urination. No pain. No fevrs or chills or back pain- check UA/ culture  # Peripheral neuropathy/left foot drop- stable  # ILD-  stable  # Mild Hypocalcemia- MAY 2024 vit D levels-55- stable.   # Mediport-IV access-stable; port flush. stable  # DISPOSITION: # check UA/ culture today.  # IVIG infusion today # IV venofer weekly x2- start next week.  # follow up in 1 month MD; port/ labs- cbc/cmp; Possible IVIG infusion-- Dr.B

## 2023-03-05 ENCOUNTER — Other Ambulatory Visit: Payer: Self-pay | Admitting: Internal Medicine

## 2023-03-05 LAB — URINE CULTURE

## 2023-03-05 MED ORDER — CIPROFLOXACIN HCL 500 MG PO TABS: 500.0000 mg | ORAL_TABLET | Freq: Two times a day (BID) | ORAL | 0 refills | Status: DC

## 2023-03-05 NOTE — Progress Notes (Signed)
A/M- Please inform pt that UA is suggestive of infection. Awaiting further results at this time- would like to start pt on ciprofloxacin. Script sent.  Thanks GB

## 2023-03-06 LAB — URINE CULTURE: Culture: >=100000 — AB

## 2023-03-08 MED FILL — Iron Sucrose Inj 20 MG/ML (Fe Equiv): INTRAVENOUS | Qty: 10 | Status: AC

## 2023-03-11 ENCOUNTER — Inpatient Hospital Stay: Payer: Medicare PPO

## 2023-03-11 VITALS — BP 129/53 | HR 81 | Temp 99.2°F | Resp 17

## 2023-03-11 DIAGNOSIS — E860 Dehydration: Secondary | ICD-10-CM

## 2023-03-11 DIAGNOSIS — C8333 Diffuse large B-cell lymphoma, intra-abdominal lymph nodes: Secondary | ICD-10-CM

## 2023-03-11 DIAGNOSIS — D849 Immunodeficiency, unspecified: Secondary | ICD-10-CM | POA: Diagnosis not present

## 2023-03-11 MED ORDER — SODIUM CHLORIDE 0.9 % IV SOLN
200.0000 mg | Freq: Once | INTRAVENOUS | Status: AC
  Administered 2023-03-11: 200 mg via INTRAVENOUS
  Filled 2023-03-11: qty 10

## 2023-03-11 MED ORDER — SODIUM CHLORIDE 0.9 % IV SOLN
Freq: Once | INTRAVENOUS | Status: AC
  Filled 2023-03-11: qty 250

## 2023-03-11 MED ORDER — HEPARIN SOD (PORK) LOCK FLUSH 100 UNIT/ML IV SOLN
500.0000 [IU] | Freq: Once | INTRAVENOUS | Status: AC | PRN
  Administered 2023-03-11: 500 [IU]
  Filled 2023-03-11: qty 5

## 2023-03-18 ENCOUNTER — Inpatient Hospital Stay: Payer: Medicare PPO

## 2023-03-18 VITALS — BP 130/60 | HR 64 | Temp 98.0°F | Resp 18

## 2023-03-18 DIAGNOSIS — D849 Immunodeficiency, unspecified: Secondary | ICD-10-CM | POA: Diagnosis not present

## 2023-03-18 DIAGNOSIS — E860 Dehydration: Secondary | ICD-10-CM

## 2023-03-18 DIAGNOSIS — C8333 Diffuse large B-cell lymphoma, intra-abdominal lymph nodes: Secondary | ICD-10-CM

## 2023-03-18 MED ORDER — SODIUM CHLORIDE 0.9 % IV SOLN
Freq: Once | INTRAVENOUS | Status: AC
  Filled 2023-03-18: qty 250

## 2023-03-18 MED ORDER — HEPARIN SOD (PORK) LOCK FLUSH 100 UNIT/ML IV SOLN
500.0000 [IU] | Freq: Once | INTRAVENOUS | Status: AC | PRN
  Administered 2023-03-18: 500 [IU]
  Filled 2023-03-18: qty 5

## 2023-03-18 MED ORDER — SODIUM CHLORIDE 0.9 % IV SOLN
200.0000 mg | Freq: Once | INTRAVENOUS | Status: AC
  Administered 2023-03-18: 200 mg via INTRAVENOUS
  Filled 2023-03-18: qty 200

## 2023-03-18 MED ORDER — SODIUM CHLORIDE 0.9% FLUSH
10.0000 mL | Freq: Once | INTRAVENOUS | Status: AC | PRN
  Administered 2023-03-18: 10 mL
  Filled 2023-03-18: qty 10

## 2023-03-18 NOTE — Patient Instructions (Signed)
Iron Sucrose Injection What is this medication? IRON SUCROSE (EYE ern SOO krose) treats low levels of iron (iron deficiency anemia) in people with kidney disease. Iron is a mineral that plays an important role in making red blood cells, which carry oxygen from your lungs to the rest of your body. This medicine may be used for other purposes; ask your health care provider or pharmacist if you have questions. COMMON BRAND NAME(S): Venofer What should I tell my care team before I take this medication? They need to know if you have any of these conditions: Anemia not caused by low iron levels Heart disease High levels of iron in the blood Kidney disease Liver disease An unusual or allergic reaction to iron, other medications, foods, dyes, or preservatives Pregnant or trying to get pregnant Breastfeeding How should I use this medication? This medication is for infusion into a vein. It is given in a hospital or clinic setting. Talk to your care team about the use of this medication in children. While this medication may be prescribed for children as young as 2 years for selected conditions, precautions do apply. Overdosage: If you think you have taken too much of this medicine contact a poison control center or emergency room at once. NOTE: This medicine is only for you. Do not share this medicine with others. What if I miss a dose? Keep appointments for follow-up doses. It is important not to miss your dose. Call your care team if you are unable to keep an appointment. What may interact with this medication? Do not take this medication with any of the following: Deferoxamine Dimercaprol Other iron products This medication may also interact with the following: Chloramphenicol Deferasirox This list may not describe all possible interactions. Give your health care provider a list of all the medicines, herbs, non-prescription drugs, or dietary supplements you use. Also tell them if you smoke,  drink alcohol, or use illegal drugs. Some items may interact with your medicine. What should I watch for while using this medication? Visit your care team regularly. Tell your care team if your symptoms do not start to get better or if they get worse. You may need blood work done while you are taking this medication. You may need to follow a special diet. Talk to your care team. Foods that contain iron include: whole grains/cereals, dried fruits, beans, or peas, leafy green vegetables, and organ meats (liver, kidney). What side effects may I notice from receiving this medication? Side effects that you should report to your care team as soon as possible: Allergic reactions--skin rash, itching, hives, swelling of the face, lips, tongue, or throat Low blood pressure--dizziness, feeling faint or lightheaded, blurry vision Shortness of breath Side effects that usually do not require medical attention (report to your care team if they continue or are bothersome): Flushing Headache Joint pain Muscle pain Nausea Pain, redness, or irritation at injection site This list may not describe all possible side effects. Call your doctor for medical advice about side effects. You may report side effects to FDA at 1-800-FDA-1088. Where should I keep my medication? This medication is given in a hospital or clinic. It will not be stored at home. NOTE: This sheet is a summary. It may not cover all possible information. If you have questions about this medicine, talk to your doctor, pharmacist, or health care provider.  2024 Elsevier/Gold Standard (2023-01-11 00:00:00)

## 2023-04-04 ENCOUNTER — Inpatient Hospital Stay: Payer: Medicare PPO | Admitting: Internal Medicine

## 2023-04-04 ENCOUNTER — Encounter: Payer: Self-pay | Admitting: Internal Medicine

## 2023-04-04 ENCOUNTER — Inpatient Hospital Stay: Payer: Medicare PPO | Attending: Internal Medicine

## 2023-04-04 DIAGNOSIS — Z7952 Long term (current) use of systemic steroids: Secondary | ICD-10-CM | POA: Insufficient documentation

## 2023-04-04 DIAGNOSIS — D649 Anemia, unspecified: Secondary | ICD-10-CM | POA: Insufficient documentation

## 2023-04-04 DIAGNOSIS — C8333 Diffuse large B-cell lymphoma, intra-abdominal lymph nodes: Secondary | ICD-10-CM | POA: Diagnosis not present

## 2023-04-04 DIAGNOSIS — D849 Immunodeficiency, unspecified: Secondary | ICD-10-CM | POA: Diagnosis present

## 2023-04-04 DIAGNOSIS — Z9221 Personal history of antineoplastic chemotherapy: Secondary | ICD-10-CM | POA: Insufficient documentation

## 2023-04-04 DIAGNOSIS — Z79899 Other long term (current) drug therapy: Secondary | ICD-10-CM | POA: Diagnosis not present

## 2023-04-04 DIAGNOSIS — Z8572 Personal history of non-Hodgkin lymphomas: Secondary | ICD-10-CM | POA: Insufficient documentation

## 2023-04-04 DIAGNOSIS — Z923 Personal history of irradiation: Secondary | ICD-10-CM | POA: Diagnosis not present

## 2023-04-04 DIAGNOSIS — Z87891 Personal history of nicotine dependence: Secondary | ICD-10-CM | POA: Diagnosis not present

## 2023-04-04 DIAGNOSIS — Z79624 Long term (current) use of inhibitors of nucleotide synthesis: Secondary | ICD-10-CM | POA: Diagnosis not present

## 2023-04-04 LAB — CMP (CANCER CENTER ONLY)
ALT: 17 U/L (ref 0–44)
AST: 26 U/L (ref 15–41)
Albumin: 4 g/dL (ref 3.5–5.0)
Alkaline Phosphatase: 54 U/L (ref 38–126)
Anion gap: 7 (ref 5–15)
BUN: 14 mg/dL (ref 8–23)
CO2: 25 mmol/L (ref 22–32)
Calcium: 9 mg/dL (ref 8.9–10.3)
Chloride: 105 mmol/L (ref 98–111)
Creatinine: 0.73 mg/dL (ref 0.44–1.00)
GFR, Estimated: >60 mL/min (ref >60)
Glucose, Bld: 101 mg/dL — ABNORMAL HIGH (ref 70–99)
Potassium: 3.9 mmol/L (ref 3.5–5.1)
Sodium: 137 mmol/L (ref 135–145)
Total Bilirubin: 0.4 mg/dL (ref 0.3–1.2)
Total Protein: 6.4 g/dL — ABNORMAL LOW (ref 6.5–8.1)

## 2023-04-04 LAB — CBC WITH DIFFERENTIAL (CANCER CENTER ONLY)
Abs Immature Granulocytes: 0.29 10*3/uL — ABNORMAL HIGH (ref 0.00–0.07)
Basophils Absolute: 0.1 10*3/uL (ref 0.0–0.1)
Basophils Relative: 1 %
Eosinophils Absolute: 0.4 10*3/uL (ref 0.0–0.5)
Eosinophils Relative: 8 %
HCT: 35.3 % — ABNORMAL LOW (ref 36.0–46.0)
Hemoglobin: 11.4 g/dL — ABNORMAL LOW (ref 12.0–15.0)
Immature Granulocytes: 6 %
Lymphocytes Relative: 10 %
Lymphs Abs: 0.5 10*3/uL — ABNORMAL LOW (ref 0.7–4.0)
MCH: 30 pg (ref 26.0–34.0)
MCHC: 32.3 g/dL (ref 30.0–36.0)
MCV: 92.9 fL (ref 80.0–100.0)
Monocytes Absolute: 0.7 10*3/uL (ref 0.1–1.0)
Monocytes Relative: 14 %
Neutro Abs: 2.8 10*3/uL (ref 1.7–7.7)
Neutrophils Relative %: 61 %
Platelet Count: 170 10*3/uL (ref 150–400)
RBC: 3.8 MIL/uL — ABNORMAL LOW (ref 3.87–5.11)
RDW: 16.7 % — ABNORMAL HIGH (ref 11.5–15.5)
Smear Review: NORMAL
WBC Count: 4.6 10*3/uL (ref 4.0–10.5)
nRBC: 0 % (ref 0.0–0.2)

## 2023-04-04 MED ORDER — VALACYCLOVIR HCL 500 MG PO TABS: 500.0000 mg | ORAL_TABLET | Freq: Every day | ORAL | 1 refills | Status: DC

## 2023-04-04 NOTE — Assessment & Plan Note (Addendum)
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 PET DEC 2023- Stable PET-CT findings. No recurrent lymphoma is identified [ Deauville 1].  Monitor clinical basis. Stable.   # Secondary immunodeficiency -multiple infections [UTI/resipiratory; COVID] s/p IVIG x3- JULY 2024-IgG-228  [trough goal: >400- 500].   Pre-meds: Continue Dex; Tylenol; benadryl 50 night pror.  Also recommend Tylenol; benadryl 50 night  x3 days after infusion.  Stable   # Skin rash postinfusion-no evidence of any anaphylactic reaction.  Also recommend Tylenol; benadryl 50 night  x3 days after infusion- see above.   # Mild Leucopenia/ intremittent neutropenia/lymphopenia- anemia-Hb 10-11-likely secondary to CAR-T therapy; no concern for any progressive disease. MAY 2024--ferritin 14 saturation-15%-  poor tolerance to gentle iron [iron biglycinate; 28 mg ]-s/p IV Venofer.x2   # Prophylactic antibiotics with Valtrex and Bactrim [until CD 4 count > 200]. Repeat  CD4- JULY 2024- 113-Refilled antibiotics.  prophylactic antibiotics- STOP Bactrim M/W/F; will refill valtrex 500 mg/day. Discussed re: shingles vaccine.   # Peripheral neuropathy/left foot drop-  stable  # ILD-  stable  # Mild Hypocalcemia- MAY 2024 vit D levels-55- stable.   # Mediport-IV access-stable; port flush.  stable  # DISPOSITION: # IVIG infusion tomorrow # follow up in 1 month MD; port/ labs- cbc/cmp; iron studies; ferritin;possible venofer; D-2 Possible IVIG infusion-- Dr.B

## 2023-04-04 NOTE — Progress Notes (Signed)
[facility] OFFICE PROGRESS NOTE  Patient Care Team: Danella Penton, [name] Earna Coder, MD as Consulting Physician (Hematology and Oncology) Stanton Kidney, MD as Consulting Physician (Gastroenterology)   Cancer Staging  No matching staging information was found for the patient.   Oncology History Overview Note  DEC 2017- DLBCL "TRIPLE HIT [myc/ bcl-2/bcl-6 gene rearrangement FISH]" ~10 cm mass RP LN; STAGE II [jan 2018- BMBx-NEG]; Jan 8th R-[facility];   # JAN 29th 2018- DA-R EPOCH x5 ; PET CR; s/p RT [last 03/18/2017]  # FEB-MARCH 2019- RECURRENCE of DLBCL [s/p RP Bx; 4 cm mass inferior to Left Kidney] ; II [facility]; Dr.Grover.   # April 3rd 2019- R-GDP [carbo]; MAY 2019- PET PR; NOV 2019- PR  #August 2020-recurrence bulky left pelvis/retroperitoneal [9-10 cm]; no biopsy. #August 24th-Rituxan weekly cycle #1; prednisone 60 mg a day.   06/03/2019 - Chemotherapy  BMT OP CAR-T LYMPHODEPLETION FLU/CY + IP AXICABTAGENE CILOLEUCEL (YESCARTA) Fludarabine 30 mg/m2 IV Days 1, 2, 3 Cyclophosphamide 500 mg/m2 IV Days 1, 2, 3 Axicabtagene ciloleucel target dose 2 x 10^6 CAR-positive viable T cells per kg body weight (maximum of 2 x 10^8 CAR-positive viable T cells) on Day 6.  # SEP 2020- 16month PET-[facility]- significant PR.   #August 2020-2D echo ejection fraction 60 to 65%  --------------------------------  # JAN 26th-LP  # Interstitial Lung disease [surveillance] --------------------------------------------------------------------------  DIAGNOSIS: [MARCH 2019 ] RECURRENT DLBCL  STAGE:   Recurrent ; GOALS: CURATIVE  CURRENT/MOST RECENT THERAPY: surveillaince   Diffuse large B-cell lymphoma of intra-abdominal lymph nodes (HCC)  04/13/2019 - 04/13/2019 Chemotherapy   The patient had riTUXimab-pvvr (RUXIENCE) 700 mg in sodium chloride 0.9 % 250 mL chemo infusion, , Intravenous, Once, 1 of 4 cycles Administration:  (04/13/2019)   for chemotherapy treatment.    05/07/2019 - 05/07/2019 Chemotherapy   The patient had riTUXimab-hyaluronidase human (RITUXAN HYCELA) 1400-23400 MG -UT/11.7ML injection SQ 1,400 mg, 1,400 mg, Subcutaneous,  Once, 1 of 4 cycles Administration: 1,400 mg (05/07/2019)  for chemotherapy treatment.     INTERVAL HISTORY: Patient is with husband. She ambulating independently.  [place] 77 y.o.  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  In the interim patient had UTI s/p ciprofloxacin.  Patient does have trouble sleeping, not taking a sleep aid.   Has had some nausea, took rx 2 times since last visit.   Has SOB with exertion.  Patient denied any pneumonias.  No hospitalizations.  Neuropathy is stable on the Gabapentin.  Denies any new lumps or bumps. No night sweats.  Review of Systems  Constitutional:  Positive for malaise/fatigue. Negative for chills, diaphoresis, fever and weight loss.  HENT:  Negative for nosebleeds and sore throat.   Eyes:  Negative for double vision.  Respiratory:  Positive for shortness of breath. Negative for cough, hemoptysis, sputum production and wheezing.   Cardiovascular:  Negative for chest pain, palpitations, orthopnea and leg swelling.  Gastrointestinal:  Negative for abdominal pain, blood in stool, constipation, diarrhea, melena and vomiting.  Genitourinary:  Negative for dysuria, frequency and urgency.  Musculoskeletal:  Positive for joint pain.  Skin: Negative.  Negative for itching and rash.  Neurological:  Positive for tingling. Negative for focal weakness and weakness.  Endo/Heme/Allergies:  Does not bruise/bleed easily.  Psychiatric/Behavioral:  Negative  for depression. The patient is not nervous/anxious and does not have insomnia.     PAST  MEDICAL HISTORY :  Past Medical History:  Diagnosis Date   Diabetes mellitus without complication (HCC)    Diffuse large B-cell lymphoma (HCC)    Chemo + rad tx's   Dysplastic nevus 03/14/2020   left inf med scapula moderate atypia    Dysplastic nevus 03/14/2020   left lat breast parallel to areaola, moderate atypia    Heart murmur    History of chemotherapy 07/08/2017   History of gastric ulcer    History of radiation therapy 07/08/2017   Hypercholesterolemia    Hypertension    ILD (interstitial lung disease) (HCC)    8 yrs ago   Lymphadenopathy     PAST SURGICAL HISTORY :   Past Surgical History:  Procedure Laterality Date   BREAST BIOPSY Left 2010   neg   CATARACT EXTRACTION W/PHACO Left 10/16/2017   Procedure: CATARACT EXTRACTION PHACO AND INTRAOCULAR LENS PLACEMENT (IOC) COMPLICATED DIABETIC LEFT;  Surgeon: Lockie Mola, MD;  Location: [facility];  Service: Ophthalmology;  Laterality: Left;  IRIS HOOKS Diabetic - oral meds   COLONOSCOPY N/A 06/19/2021   Procedure: COLONOSCOPY;  Surgeon: Jaynie Collins, DO;  Location: [facility] END[facility];  Service: Gastroenterology;  Laterality: N/A;   ESOPHAGOGASTRODUODENOSCOPY N/A 06/19/2021   Procedure: ESOPHAGOGASTRODUODENOSCOPY (EGD);  Surgeon: Jaynie Collins, DO;  Location: [facility] ENDOSCOPY;  Service: Gastroenterology;  Laterality: N/A;   HERNIA REPAIR     PARTIAL HYSTERECTOMY     age 66   PERIPHERAL VASCULAR CATHETERIZATION N/A 08/22/2016   Procedure: Shelda Pal Cath Insertion;  Surgeon: Renford Dills, MD;  Location: [facility] INVASIVE CV LAB;  Service: Cardiovascular;  Laterality: N/A;   TONSILLECTOMY      FAMILY HISTORY :   Family History  Problem Relation Age of Onset   Heart disease Mother    Heart disease Father    Heart disease Brother     SOCIAL HISTORY:   Social History   Tobacco Use   Smoking status: Former    Current packs/day: 0.00    Types: Cigarettes    Quit date: 08/21/1958    Years since  quitting: 64.6   Smokeless tobacco: Never  Vaping Use   Vaping status: Never Used  Substance Use Topics   Alcohol use: No   Drug use: No    ALLERGIES:  is allergic to levaquin [levofloxacin in d5w], cefuroxime axetil, and doxycycline.  MEDICATIONS:  Current Outpatient Medications  Medication Sig Dispense Refill   acetaminophen (TYLENOL) 325 MG tablet Take by mouth.     b complex vitamins tablet Take 1 tablet by mouth daily.     buPROPion (WELLBUTRIN XL) 150 MG 24 hr tablet Take 150 mg by mouth at bedtime.     dexamethasone (DECADRON) 4 MG tablet Take 1 tablet (4 mg total) by mouth 2 (two) times daily. For 1 day; prior to infusion. 30 tablet 0   gabapentin (NEURONTIN) 300 MG capsule Take 1 capsule (300 mg total) by mouth 2 (two) times daily. (Patient taking differently: Take 300 mg by mouth daily.) 60 capsule 3   ibuprofen (ADVIL) 200 MG tablet Take by mouth.     latanoprost (XALATAN) 0.005 % ophthalmic solution Place 1 drop into both eyes at bedtime.      Magnesium 400 MG TABS Take 1 tablet by mouth daily.     metoprolol tartrate (LOPRESSOR) 25 MG tablet TAKE 1 TABLET BY MOUTH TWICE DAILY (Patient taking  differently: 12.5 mg 2 (two) times daily. (BETA BLOCKER)) 180 tablet 0   montelukast (SINGULAIR) 10 MG tablet Take 1 tablet (10 mg total) by mouth at bedtime. One a day. 30 tablet 11   ondansetron (ZOFRAN) 8 MG tablet Take by mouth.     pantoprazole (PROTONIX) 40 MG tablet Take 1 tablet by mouth in the morning and at bedtime.     pravastatin (PRAVACHOL) 40 MG tablet Take 20 mg by mouth daily.     prochlorperazine (COMPAZINE) 10 MG tablet Take 10 mg by mouth every 6 (six) hours as needed for nausea or vomiting.     valACYclovir (VALTREX) 500 MG tablet Take 1 tablet (500 mg total) by mouth daily. 90 tablet 1   No current facility-administered medications for this visit.   Facility-Administered Medications Ordered in Other Visits  Medication Dose Route Frequency Provider Last Rate Last  Admin   0.9 %  sodium chloride infusion   Intravenous Continuous Earna Coder, MD 10 mL/hr at 06/19/21 1230 Continued from Pre-op at 06/19/21 1230   0.9 %  sodium chloride infusion   Intravenous Continuous Earna Coder, MD   Stopped at 12/26/16 1011   0.9 %  sodium chloride infusion   Intravenous Continuous Louretta Shorten R, MD 10 mL/hr at 12/27/16 0930 New Bag at 12/27/16 0930   heparin lock flush 100 unit/mL  500 Units Intravenous Once Louretta Shorten R, MD       heparin lock flush 100 unit/mL  500 Units Intravenous Once Louretta Shorten R, MD       sodium chloride flush (NS) 0.9 % injection 10 mL  10 mL Intravenous PRN Louretta Shorten R, MD   10 mL at 11/06/16 1000   sodium chloride flush (NS) 0.9 % injection 10 mL  10 mL Intravenous PRN Earna Coder, MD   10 mL at 11/07/16 0905   sodium chloride flush (NS) 0.9 % injection 10 mL  10 mL Intravenous PRN Earna Coder, MD       sodium chloride flush (NS) 0.9 % injection 10 mL  10 mL Intravenous PRN Louretta Shorten R, MD   10 mL at 12/05/16 1030   sodium chloride flush (NS) 0.9 % injection 10 mL  10 mL Intravenous PRN Louretta Shorten R, MD   10 mL at 12/27/16 0930   sodium chloride flush (NS) 0.9 % injection 10 mL  10 mL Intravenous PRN Earna Coder, MD   10 mL at 09/27/20 1305    PHYSICAL EXAMINATION: ECOG PERFORMANCE STATUS: 1 - Symptomatic but completely ambulatory  BP (!) 144/57 (BP Location: Right Arm, Patient Position: Sitting, Cuff Size: Normal)   Pulse 70   Temp 98.7 F (37.1 C) (Tympanic)   Ht 5\' 7"  (1.702 m)   Wt 137 lb (62.1 kg)   SpO2 95%   BMI 21.46 kg/m   Filed Weights   04/04/23 0938  Weight: 137 lb (62.1 kg)     Tenderness left maxillary sinus area  Physical Exam HENT:     Head: Normocephalic and atraumatic.     Mouth/Throat:     Pharynx: No oropharyngeal exudate.  Eyes:     Pupils: Pupils are equal, round, and reactive to light.   Cardiovascular:     Rate and Rhythm: Normal rate and regular rhythm.  Pulmonary:     Effort: No respiratory distress.     Breath sounds: No wheezing.     Comments: Bilateral crackles at the bases.  No wheeze. Abdominal:  General: Bowel sounds are normal. There is no distension.     Palpations: Abdomen is soft. There is no mass.     Tenderness: There is no abdominal tenderness. There is no guarding or rebound.  Musculoskeletal:        General: No tenderness. Normal range of motion.     Cervical back: Normal range of motion and neck supple.  Skin:    General: Skin is warm.  Neurological:     Mental Status: She is alert and oriented to person, place, and time.  Psychiatric:        Mood and Affect: Affect normal.    LABORATORY DATA:  I have reviewed the data as listed    Component Value Date/Time   NA 137 04/04/2023 0943   K 3.9 04/04/2023 0943   K 4.1 07/01/2014 1429   CL 105 04/04/2023 0943   CO2 25 04/04/2023 0943   GLUCOSE 101 (H) 04/04/2023 0943   BUN 14 04/04/2023 0943   CREATININE 0.73 04/04/2023 0943   CALCIUM 9.0 04/04/2023 0943   PROT 6.4 (L) 04/04/2023 0943   ALBUMIN 4.0 04/04/2023 0943   AST 26 04/04/2023 0943   ALT 17 04/04/2023 0943   ALKPHOS 54 04/04/2023 0943   BILITOT 0.4 04/04/2023 0943   GFRNONAA >60 04/04/2023 0943   GFRAA >60 03/28/2020 1305    No results found for: "SPEP", "UPEP"  Lab Results  Component Value Date   WBC 4.6 04/04/2023   NEUTROABS 2.8 04/04/2023   HGB 11.4 (L) 04/04/2023   HCT 35.3 (L) 04/04/2023   MCV 92.9 04/04/2023   PLT 170 04/04/2023      Chemistry      Component Value Date/Time   NA 137 04/04/2023 0943   K 3.9 04/04/2023 0943   K 4.1 07/01/2014 1429   CL 105 04/04/2023 0943   CO2 25 04/04/2023 0943   BUN 14 04/04/2023 0943   CREATININE 0.73 04/04/2023 0943      Component Value Date/Time   CALCIUM 9.0 04/04/2023 0943   ALKPHOS 54 04/04/2023 0943   AST 26 04/04/2023 0943   ALT 17 04/04/2023 0943    BILITOT 0.4 04/04/2023 0943       RADIOGRAPHIC STUDIES: I have personally reviewed the radiological images as listed and agreed with the findings in the report. No results found.   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 PET DEC 2023- Stable PET-CT findings. No recurrent lymphoma is identified [ [place] 1].  Monitor clinical basis. Stable.   # Secondary immunodeficiency -multiple infections [UTI/resipiratory; COVID] s/p IVIG x3- JULY 2024-IgG-228  [trough goal: >400- 500].   Pre-meds: Continue Dex; Tylenol; benadryl 50 night pror.  Also recommend Tylenol; benadryl 50 night  x3 days after infusion.  Stable   # Skin rash postinfusion-no evidence of any anaphylactic reaction.  Also recommend Tylenol; benadryl 50 night  x3 days after infusion- see above.   # Mild Leucopenia/ intremittent neutropenia/lymphopenia- anemia-Hb 10-11-likely secondary to CAR-T therapy; no concern for any progressive disease. MAY 2024--ferritin 14 saturation-15%-  poor tolerance to gentle iron [iron biglycinate; 28 mg ]-s/p IV Venofer.x2   # Prophylactic antibiotics with Valtrex and Bactrim [until CD 4 count > 200]. Repeat  CD4- JULY 2024- 113-Refilled antibiotics.  prophylactic antibiotics- STOP Bactrim M/W/F; will refill valtrex 500 mg/day.   # Peripheral neuropathy/left foot drop-  stable  #  ILD-  stable  # Mild Hypocalcemia- MAY 2024 vit D levels-55- stable.   # Mediport-IV access-stable; port flush.  stable  # DISPOSITION: # IVIG infusion tomorrow # follow up in 1 month MD; port/ labs- cbc/cmp; iron studies; ferritin; D-2 Possible IVIG infusion-- Dr.B     No orders of the defined types were placed in this encounter.  All questions were answered. The patient knows to call the clinic with any problems, questions or concerns.      Earna Coder, MD 04/04/2023 10:40 AM

## 2023-04-04 NOTE — Progress Notes (Signed)
Does have trouble sleeping, not taking a sleep aid.  Has had some nausea, took rx 2 times since last visit.  Has SOB with exertion.

## 2023-04-05 ENCOUNTER — Inpatient Hospital Stay: Payer: Medicare PPO

## 2023-04-05 VITALS — BP 128/54 | HR 53 | Temp 97.5°F | Resp 16

## 2023-04-05 DIAGNOSIS — C8333 Diffuse large B-cell lymphoma, intra-abdominal lymph nodes: Secondary | ICD-10-CM

## 2023-04-05 DIAGNOSIS — E860 Dehydration: Secondary | ICD-10-CM

## 2023-04-05 DIAGNOSIS — D849 Immunodeficiency, unspecified: Secondary | ICD-10-CM | POA: Diagnosis not present

## 2023-04-05 MED ORDER — ACETAMINOPHEN 325 MG PO TABS
650.0000 mg | ORAL_TABLET | Freq: Once | ORAL | Status: AC
  Administered 2023-04-05: 650 mg via ORAL
  Filled 2023-04-05: qty 2

## 2023-04-05 MED ORDER — IMMUNE GLOBULIN (HUMAN) 10 GM/100ML IV SOLN
400.0000 mg/kg | Freq: Once | INTRAVENOUS | Status: AC
  Administered 2023-04-05: 25 g via INTRAVENOUS
  Filled 2023-04-05: qty 250

## 2023-04-05 MED ORDER — SODIUM CHLORIDE 0.9 % IV SOLN
10.0000 mg | Freq: Once | INTRAVENOUS | Status: AC
  Administered 2023-04-05: 10 mg via INTRAVENOUS
  Filled 2023-04-05: qty 10

## 2023-04-05 MED ORDER — HEPARIN SOD (PORK) LOCK FLUSH 100 UNIT/ML IV SOLN
500.0000 [IU] | Freq: Once | INTRAVENOUS | Status: AC | PRN
  Administered 2023-04-05: 500 [IU]
  Filled 2023-04-05: qty 5

## 2023-04-05 MED ORDER — SODIUM CHLORIDE 0.9% FLUSH
10.0000 mL | INTRAVENOUS | Status: DC | PRN
  Administered 2023-04-05: 10 mL
  Filled 2023-04-05: qty 10

## 2023-04-05 MED ORDER — DEXTROSE 5 % IV SOLN
Freq: Once | INTRAVENOUS | Status: AC
  Filled 2023-04-05: qty 250

## 2023-04-05 MED ORDER — MONTELUKAST SODIUM 10 MG PO TABS: 10.0000 mg | ORAL_TABLET | Freq: Once | ORAL | Status: DC

## 2023-04-05 MED ORDER — DIPHENHYDRAMINE HCL 25 MG PO CAPS
50.0000 mg | ORAL_CAPSULE | Freq: Once | ORAL | Status: AC
  Administered 2023-04-05: 25 mg via ORAL
  Filled 2023-04-05: qty 2

## 2023-04-05 NOTE — Patient Instructions (Signed)

## 2023-05-01 ENCOUNTER — Inpatient Hospital Stay: Payer: Medicare PPO

## 2023-05-01 ENCOUNTER — Encounter: Payer: Self-pay | Admitting: Medical Oncology

## 2023-05-01 ENCOUNTER — Inpatient Hospital Stay: Payer: Medicare PPO | Attending: Internal Medicine

## 2023-05-01 ENCOUNTER — Inpatient Hospital Stay: Payer: Medicare PPO | Admitting: Medical Oncology

## 2023-05-01 VITALS — BP 142/51 | HR 67 | Temp 97.9°F | Wt 137.8 lb

## 2023-05-01 VITALS — BP 137/61 | HR 66

## 2023-05-01 DIAGNOSIS — Z8572 Personal history of non-Hodgkin lymphomas: Secondary | ICD-10-CM | POA: Insufficient documentation

## 2023-05-01 DIAGNOSIS — C8333 Diffuse large B-cell lymphoma, intra-abdominal lymph nodes: Secondary | ICD-10-CM

## 2023-05-01 DIAGNOSIS — Z87891 Personal history of nicotine dependence: Secondary | ICD-10-CM | POA: Diagnosis not present

## 2023-05-01 DIAGNOSIS — J849 Interstitial pulmonary disease, unspecified: Secondary | ICD-10-CM | POA: Diagnosis not present

## 2023-05-01 DIAGNOSIS — Z79624 Long term (current) use of inhibitors of nucleotide synthesis: Secondary | ICD-10-CM | POA: Diagnosis not present

## 2023-05-01 DIAGNOSIS — D84821 Immunodeficiency due to drugs: Secondary | ICD-10-CM

## 2023-05-01 DIAGNOSIS — Z79899 Other long term (current) drug therapy: Secondary | ICD-10-CM | POA: Insufficient documentation

## 2023-05-01 DIAGNOSIS — K6389 Other specified diseases of intestine: Secondary | ICD-10-CM | POA: Diagnosis not present

## 2023-05-01 DIAGNOSIS — R590 Localized enlarged lymph nodes: Secondary | ICD-10-CM | POA: Diagnosis not present

## 2023-05-01 DIAGNOSIS — D849 Immunodeficiency, unspecified: Secondary | ICD-10-CM | POA: Diagnosis present

## 2023-05-01 DIAGNOSIS — D649 Anemia, unspecified: Secondary | ICD-10-CM | POA: Diagnosis present

## 2023-05-01 DIAGNOSIS — E860 Dehydration: Secondary | ICD-10-CM

## 2023-05-01 DIAGNOSIS — T451X5A Adverse effect of antineoplastic and immunosuppressive drugs, initial encounter: Secondary | ICD-10-CM

## 2023-05-01 LAB — CMP (CANCER CENTER ONLY)
ALT: 19 U/L (ref 0–44)
AST: 30 U/L (ref 15–41)
Albumin: 3.7 g/dL (ref 3.5–5.0)
Alkaline Phosphatase: 51 U/L (ref 38–126)
Anion gap: 7 (ref 5–15)
BUN: 14 mg/dL (ref 8–23)
CO2: 25 mmol/L (ref 22–32)
Calcium: 8.7 mg/dL — ABNORMAL LOW (ref 8.9–10.3)
Chloride: 105 mmol/L (ref 98–111)
Creatinine: 0.65 mg/dL (ref 0.44–1.00)
GFR, Estimated: >60 mL/min (ref >60)
Glucose, Bld: 100 mg/dL — ABNORMAL HIGH (ref 70–99)
Potassium: 3.9 mmol/L (ref 3.5–5.1)
Sodium: 137 mmol/L (ref 135–145)
Total Bilirubin: 0.3 mg/dL (ref 0.3–1.2)
Total Protein: 6.5 g/dL (ref 6.5–8.1)

## 2023-05-01 LAB — CBC WITH DIFFERENTIAL (CANCER CENTER ONLY)
Abs Immature Granulocytes: 0.09 10*3/uL — ABNORMAL HIGH (ref 0.00–0.07)
Basophils Absolute: 0.1 10*3/uL (ref 0.0–0.1)
Basophils Relative: 1 %
Eosinophils Absolute: 0.3 10*3/uL (ref 0.0–0.5)
Eosinophils Relative: 8 %
HCT: 35.7 % — ABNORMAL LOW (ref 36.0–46.0)
Hemoglobin: 11.8 g/dL — ABNORMAL LOW (ref 12.0–15.0)
Immature Granulocytes: 2 %
Lymphocytes Relative: 12 %
Lymphs Abs: 0.5 10*3/uL — ABNORMAL LOW (ref 0.7–4.0)
MCH: 31.1 pg (ref 26.0–34.0)
MCHC: 33.1 g/dL (ref 30.0–36.0)
MCV: 94.2 fL (ref 80.0–100.0)
Monocytes Absolute: 0.6 10*3/uL (ref 0.1–1.0)
Monocytes Relative: 14 %
Neutro Abs: 2.6 10*3/uL (ref 1.7–7.7)
Neutrophils Relative %: 63 %
Platelet Count: 186 10*3/uL (ref 150–400)
RBC: 3.79 MIL/uL — ABNORMAL LOW (ref 3.87–5.11)
RDW: 16 % — ABNORMAL HIGH (ref 11.5–15.5)
WBC Count: 4.1 10*3/uL (ref 4.0–10.5)
nRBC: 0 % (ref 0.0–0.2)

## 2023-05-01 LAB — IRON AND TIBC
Iron: 82 ug/dL (ref 28–170)
Saturation Ratios: 25 % (ref 10.4–31.8)
TIBC: 333 ug/dL (ref 250–450)
UIBC: 251 ug/dL

## 2023-05-01 LAB — FERRITIN: Ferritin: 89 ng/mL (ref 11–307)

## 2023-05-01 MED ORDER — SODIUM CHLORIDE 0.9% FLUSH
10.0000 mL | Freq: Once | INTRAVENOUS | Status: AC
  Administered 2023-05-01: 10 mL via INTRAVENOUS
  Filled 2023-05-01: qty 10

## 2023-05-01 MED ORDER — HEPARIN SOD (PORK) LOCK FLUSH 100 UNIT/ML IV SOLN
500.0000 [IU] | Freq: Once | INTRAVENOUS | Status: AC
  Administered 2023-05-01: 500 [IU] via INTRAVENOUS
  Filled 2023-05-01: qty 5

## 2023-05-01 MED ORDER — HEPARIN SOD (PORK) LOCK FLUSH 100 UNIT/ML IV SOLN
500.0000 [IU] | Freq: Once | INTRAVENOUS | Status: DC | PRN
  Filled 2023-05-01: qty 5

## 2023-05-01 MED ORDER — SODIUM CHLORIDE 0.9 % IV SOLN
Freq: Once | INTRAVENOUS | Status: AC
  Filled 2023-05-01: qty 250

## 2023-05-01 MED ORDER — SODIUM CHLORIDE 0.9 % IV SOLN
200.0000 mg | Freq: Once | INTRAVENOUS | Status: AC
  Administered 2023-05-01: 200 mg via INTRAVENOUS
  Filled 2023-05-01: qty 200

## 2023-05-01 NOTE — Patient Instructions (Signed)
Iron Sucrose Injection What is this medication? IRON SUCROSE (EYE ern SOO krose) treats low levels of iron (iron deficiency anemia) in people with kidney disease. Iron is a mineral that plays an important role in making red blood cells, which carry oxygen from your lungs to the rest of your body. This medicine may be used for other purposes; ask your health care provider or pharmacist if you have questions. COMMON BRAND NAME(S): Venofer What should I tell my care team before I take this medication? They need to know if you have any of these conditions: Anemia not caused by low iron levels Heart disease High levels of iron in the blood Kidney disease Liver disease An unusual or allergic reaction to iron, other medications, foods, dyes, or preservatives Pregnant or trying to get pregnant Breastfeeding How should I use this medication? This medication is for infusion into a vein. It is given in a hospital or clinic setting. Talk to your care team about the use of this medication in children. While this medication may be prescribed for children as young as 2 years for selected conditions, precautions do apply. Overdosage: If you think you have taken too much of this medicine contact a poison control center or emergency room at once. NOTE: This medicine is only for you. Do not share this medicine with others. What if I miss a dose? Keep appointments for follow-up doses. It is important not to miss your dose. Call your care team if you are unable to keep an appointment. What may interact with this medication? Do not take this medication with any of the following: Deferoxamine Dimercaprol Other iron products This medication may also interact with the following: Chloramphenicol Deferasirox This list may not describe all possible interactions. Give your health care provider a list of all the medicines, herbs, non-prescription drugs, or dietary supplements you use. Also tell them if you smoke,  drink alcohol, or use illegal drugs. Some items may interact with your medicine. What should I watch for while using this medication? Visit your care team regularly. Tell your care team if your symptoms do not start to get better or if they get worse. You may need blood work done while you are taking this medication. You may need to follow a special diet. Talk to your care team. Foods that contain iron include: whole grains/cereals, dried fruits, beans, or peas, leafy green vegetables, and organ meats (liver, kidney). What side effects may I notice from receiving this medication? Side effects that you should report to your care team as soon as possible: Allergic reactions--skin rash, itching, hives, swelling of the face, lips, tongue, or throat Low blood pressure--dizziness, feeling faint or lightheaded, blurry vision Shortness of breath Side effects that usually do not require medical attention (report to your care team if they continue or are bothersome): Flushing Headache Joint pain Muscle pain Nausea Pain, redness, or irritation at injection site This list may not describe all possible side effects. Call your doctor for medical advice about side effects. You may report side effects to FDA at 1-800-FDA-1088. Where should I keep my medication? This medication is given in a hospital or clinic. It will not be stored at home. NOTE: This sheet is a summary. It may not cover all possible information. If you have questions about this medicine, talk to your doctor, pharmacist, or health care provider.  2024 Elsevier/Gold Standard (2023-01-11 00:00:00)

## 2023-05-01 NOTE — Progress Notes (Signed)
[facility] OFFICE PROGRESS NOTE  Patient Care Team: Danella Penton, [name] Earna Coder, MD as Consulting Physician (Hematology and Oncology) Stanton Kidney, MD as Consulting Physician (Gastroenterology)   Cancer Staging  No matching staging information was found for the patient.   Oncology History Overview Note  DEC 2017- DLBCL "TRIPLE HIT [myc/ bcl-2/bcl-6 gene rearrangement FISH]" ~10 cm mass RP LN; STAGE II [jan 2018- BMBx-NEG]; Jan 8th R-[facility]OP;   # JAN 29th 2018- DA-R EPOCH x5 ; PET CR; s/p RT [last 03/18/2017]  # FEB-MARCH 2019- RECURRENCE of DLBCL [s/p RP Bx; 4 cm mass inferior to Left Kidney] ; II [facility]; Dr.Grover.   # April 3rd 2019- R-GDP [carbo]; MAY 2019- PET PR; NOV 2019- PR  #August 2020-recurrence bulky left pelvis/retroperitoneal [9-10 cm]; no biopsy. #August 24th-Rituxan weekly cycle #1; prednisone 60 mg a day.   06/03/2019 - Chemotherapy  BMT OP CAR-T LYMPHODEPLETION FLU/CY + IP AXICABTAGENE CILOLEUCEL (YESCARTA) Fludarabine 30 mg/m2 IV Days 1, 2, 3 Cyclophosphamide 500 mg/m2 IV Days 1, 2, 3 Axicabtagene ciloleucel target dose 2 x 10^6 CAR-positive viable T cells per kg body weight (maximum of 2 x 10^8 CAR-positive viable T cells) on Day 6.  # SEP 2020- 53month PET-[facility]- significant PR.   #August 2020-2D echo ejection fraction 60 to 65%  --------------------------------  # JAN 26th-LP  # Interstitial Lung disease [surveillance] --------------------------------------------------------------------------  DIAGNOSIS: [MARCH 2019 ] RECURRENT DLBCL  STAGE:   Recurrent ; GOALS: CURATIVE  CURRENT/MOST RECENT THERAPY: surveillaince   Diffuse large B-cell lymphoma of intra-abdominal lymph nodes (HCC)  04/13/2019 - 04/13/2019 Chemotherapy   The patient had riTUXimab-pvvr (RUXIENCE) 700 mg in sodium chloride 0.9 % 250 mL chemo infusion, , Intravenous, Once, 1 of 4 cycles Administration:  (04/13/2019)   for chemotherapy treatment.    05/07/2019 - 05/07/2019 Chemotherapy   The patient had riTUXimab-hyaluronidase human (RITUXAN HYCELA) 1400-23400 MG -UT/11.7ML injection SQ 1,400 mg, 1,400 mg, Subcutaneous,  Once, 1 of 4 cycles Administration: 1,400 mg (05/07/2019)  for chemotherapy treatment.     INTERVAL HISTORY: Patient is with husband. She ambulating independently.  [place] 77 y.o.  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  Has felt well since her last visit. No unintentional weight loss, new fatigue, night sweats, breast changes, adenopathy.    Has SOB with exertion which is chronic and unchanged.   Patient denied any pneumonias.  No hospitalizations, infections, fevers or illnesses  Neuropathy is stable on the Gabapentin.  Denies any new lumps or bumps. No night sweats.  Review of Systems  Constitutional:  Positive for malaise/fatigue. Negative for chills, diaphoresis, fever and weight loss.  HENT:  Negative for nosebleeds and sore throat.   Eyes:  Negative for double vision.  Respiratory:  Positive for shortness of breath. Negative for cough, hemoptysis, sputum production and wheezing.   Cardiovascular:  Negative for chest pain, palpitations, orthopnea and leg swelling.  Gastrointestinal:  Negative for abdominal pain, blood in stool, constipation, diarrhea, melena and vomiting.  Genitourinary:  Negative for dysuria, frequency and urgency.  Musculoskeletal:  Positive for joint pain.  Skin: Negative.  Negative for itching and rash.  Neurological:  Positive for tingling. Negative for focal weakness and weakness.  Endo/Heme/Allergies:  Does not bruise/bleed easily.  Psychiatric/Behavioral:  Negative for depression. The  patient is not nervous/anxious and does not have insomnia.      PAST MEDICAL HISTORY :  Past Medical History:  Diagnosis Date   Diabetes mellitus without complication (HCC)    Diffuse large B-cell lymphoma (HCC)    Chemo + rad tx's   Dysplastic nevus 03/14/2020   left inf med scapula moderate atypia    Dysplastic nevus 03/14/2020   left lat breast parallel to areaola, moderate atypia    Heart murmur    History of chemotherapy 07/08/2017   History of gastric ulcer    History of radiation therapy 07/08/2017   Hypercholesterolemia    Hypertension    ILD (interstitial lung disease) (HCC)    8 yrs ago   Lymphadenopathy     PAST SURGICAL HISTORY :   Past Surgical History:  Procedure Laterality Date   BREAST BIOPSY Left 2010   neg   CATARACT EXTRACTION W/PHACO Left 10/16/2017   Procedure: CATARACT EXTRACTION PHACO AND INTRAOCULAR LENS PLACEMENT (IOC) COMPLICATED DIABETIC LEFT;  Surgeon: Lockie Mola, MD;  Location: [facility];  Service: Ophthalmology;  Laterality: Left;  IRIS HOOKS Diabetic - oral meds   COLONOSCOPY N/A 06/19/2021   Procedure: COLONOSCOPY;  Surgeon: Jaynie Collins, DO;  Location: [facility] ENDOSCOPY;  Service: Gastroenterology;  Laterality: N/A;   ESOPHAGOGASTRODUODENOSCOPY N/A 06/19/2021   Procedure: ESOPHAGOGASTRODUODENOSCOPY (EGD);  Surgeon: Jaynie Collins, DO;  Location: [facility] ENDOSCOPY;  Service: Gastroenterology;  Laterality: N/A;   HERNIA REPAIR     PARTIAL HYSTERECTOMY     age 6   PERIPHERAL VASCULAR CATHETERIZATION N/A 08/22/2016   Procedure: Shelda Pal Cath Insertion;  Surgeon: Renford Dills, MD;  Location: [facility] INVASIVE CV LAB;  Service: Cardiovascular;  Laterality: N/A;   TONSILLECTOMY      FAMILY HISTORY :   Family History  Problem Relation Age of Onset   Heart disease Mother    Heart disease Father    Heart disease Brother     SOCIAL HISTORY:   Social History   Tobacco Use   Smoking status: Former    Current packs/day: 0.00    Types: Cigarettes    Quit date: 08/21/1958     Years since quitting: 64.7   Smokeless tobacco: Never  Vaping Use   Vaping status: Never Used  Substance Use Topics   Alcohol use: No   Drug use: No    ALLERGIES:  is allergic to levaquin [levofloxacin in d5w], cefuroxime axetil, and doxycycline.  MEDICATIONS:  Current Outpatient Medications  Medication Sig Dispense Refill   acetaminophen (TYLENOL) 325 MG tablet Take by mouth.     b complex vitamins tablet Take 1 tablet by mouth daily.     buPROPion (WELLBUTRIN XL) 150 MG 24 hr tablet Take 150 mg by mouth at bedtime.     dexamethasone (DECADRON) 4 MG tablet Take 1 tablet (4 mg total) by mouth 2 (two) times daily. For 1 day; prior to infusion. 30 tablet 0   gabapentin (NEURONTIN) 300 MG capsule Take 1 capsule (300 mg total) by mouth 2 (two) times daily. (Patient taking differently: Take 300 mg by mouth daily.) 60 capsule 3   ibuprofen (ADVIL) 200 MG tablet Take by mouth.     latanoprost (XALATAN) 0.005 % ophthalmic solution Place 1 drop into both eyes at bedtime.      Magnesium 400 MG TABS Take 1 tablet by mouth daily.     metoprolol tartrate (LOPRESSOR) 25 MG tablet TAKE 1 TABLET BY MOUTH TWICE DAILY (Patient taking differently: 12.5 mg  2 (two) times daily. (BETA BLOCKER)) 180 tablet 0   montelukast (SINGULAIR) 10 MG tablet Take 1 tablet (10 mg total) by mouth at bedtime. One a day. 30 tablet 11   ondansetron (ZOFRAN) 8 MG tablet Take by mouth.     pantoprazole (PROTONIX) 40 MG tablet Take 1 tablet by mouth in the morning and at bedtime.     pravastatin (PRAVACHOL) 40 MG tablet Take 20 mg by mouth daily.     prochlorperazine (COMPAZINE) 10 MG tablet Take 10 mg by mouth every 6 (six) hours as needed for nausea or vomiting.     valACYclovir (VALTREX) 500 MG tablet Take 1 tablet (500 mg total) by mouth daily. 90 tablet 1   No current facility-administered medications for this visit.   Facility-Administered Medications Ordered in Other Visits  Medication Dose Route Frequency Provider  Last Rate Last Admin   0.9 %  sodium chloride infusion   Intravenous Continuous Earna Coder, MD 10 mL/hr at 06/19/21 1230 Continued from Pre-op at 06/19/21 1230   0.9 %  sodium chloride infusion   Intravenous Continuous Earna Coder, MD   Stopped at 12/26/16 1011   0.9 %  sodium chloride infusion   Intravenous Continuous Louretta Shorten R, MD 10 mL/hr at 12/27/16 0930 New Bag at 12/27/16 0930   heparin lock flush 100 unit/mL  500 Units Intravenous Once Louretta Shorten R, MD       heparin lock flush 100 unit/mL  500 Units Intravenous Once Louretta Shorten R, MD       heparin lock flush 100 unit/mL  500 Units Intracatheter Once PRN Earna Coder, MD       sodium chloride flush (NS) 0.9 % injection 10 mL  10 mL Intravenous PRN Louretta Shorten R, MD   10 mL at 11/06/16 1000   sodium chloride flush (NS) 0.9 % injection 10 mL  10 mL Intravenous PRN Earna Coder, MD   10 mL at 11/07/16 0905   sodium chloride flush (NS) 0.9 % injection 10 mL  10 mL Intravenous PRN Earna Coder, MD       sodium chloride flush (NS) 0.9 % injection 10 mL  10 mL Intravenous PRN Louretta Shorten R, MD   10 mL at 12/05/16 1030   sodium chloride flush (NS) 0.9 % injection 10 mL  10 mL Intravenous PRN Louretta Shorten R, MD   10 mL at 12/27/16 0930   sodium chloride flush (NS) 0.9 % injection 10 mL  10 mL Intravenous PRN Earna Coder, MD   10 mL at 09/27/20 1305    PHYSICAL EXAMINATION: ECOG PERFORMANCE STATUS: 1 - Symptomatic but completely ambulatory  BP (!) 142/51 (BP Location: Left Arm, Patient Position: Sitting)   Pulse 67   Temp 97.9 F (36.6 C) (Tympanic)   Wt 137 lb 12.8 oz (62.5 kg)   SpO2 95%   BMI 21.58 kg/m   Filed Weights   05/01/23 1115  Weight: 137 lb 12.8 oz (62.5 kg)   Physical Exam HENT:     Head: Normocephalic and atraumatic.     Mouth/Throat:     Pharynx: No oropharyngeal exudate.  Eyes:     Pupils: Pupils are equal,  round, and reactive to light.  Cardiovascular:     Rate and Rhythm: Normal rate and regular rhythm.  Pulmonary:     Effort: No respiratory distress.     Breath sounds: No wheezing.  Abdominal:     General: Bowel sounds are  normal. There is no distension.     Palpations: Abdomen is soft. There is no mass.     Tenderness: There is no abdominal tenderness. There is no guarding or rebound.  Musculoskeletal:        General: No tenderness. Normal range of motion.     Cervical back: Normal range of motion and neck supple.  Lymphadenopathy:     Head:     Right side of head: No submental, submandibular, tonsillar, preauricular, posterior auricular or occipital adenopathy.     Left side of head: No submental, submandibular, tonsillar, preauricular, posterior auricular or occipital adenopathy.     Cervical: No cervical adenopathy.     Right cervical: No superficial, deep or posterior cervical adenopathy.    Left cervical: No superficial, deep or posterior cervical adenopathy.     Upper Body:     Right upper body: Axillary adenopathy present. No supraclavicular or pectoral adenopathy.     Left upper body: No supraclavicular, axillary or pectoral adenopathy.     Comments: Fullness and mild tenderness of right axillary lymph nodes compared to the left.   Skin:    General: Skin is warm.  Neurological:     Mental Status: She is alert and oriented to person, place, and time.  Psychiatric:        Mood and Affect: Affect normal.    LABORATORY DATA:  I have reviewed the data as listed    Component Value Date/Time   NA 137 05/01/2023 1102   K 3.9 05/01/2023 1102   K 4.1 07/01/2014 1429   CL 105 05/01/2023 1102   CO2 25 05/01/2023 1102   GLUCOSE 100 (H) 05/01/2023 1102   BUN 14 05/01/2023 1102   CREATININE 0.65 05/01/2023 1102   CALCIUM 8.7 (L) 05/01/2023 1102   PROT 6.5 05/01/2023 1102   ALBUMIN 3.7 05/01/2023 1102   AST 30 05/01/2023 1102   ALT 19 05/01/2023 1102   ALKPHOS 51 05/01/2023  1102   BILITOT 0.3 05/01/2023 1102   GFRNONAA >60 05/01/2023 1102   GFRAA >60 03/28/2020 1305    No results found for: "SPEP", "UPEP"  Lab Results  Component Value Date   WBC 4.1 05/01/2023   NEUTROABS 2.6 05/01/2023   HGB 11.8 (L) 05/01/2023   HCT 35.7 (L) 05/01/2023   MCV 94.2 05/01/2023   PLT 186 05/01/2023      Chemistry      Component Value Date/Time   NA 137 05/01/2023 1102   K 3.9 05/01/2023 1102   K 4.1 07/01/2014 1429   CL 105 05/01/2023 1102   CO2 25 05/01/2023 1102   BUN 14 05/01/2023 1102   CREATININE 0.65 05/01/2023 1102      Component Value Date/Time   CALCIUM 8.7 (L) 05/01/2023 1102   ALKPHOS 51 05/01/2023 1102   AST 30 05/01/2023 1102   ALT 19 05/01/2023 1102   BILITOT 0.3 05/01/2023 1102     ASSESSMENT & PLAN:  Diffuse large B-cell lymphoma of intra-abdominal lymph nodes (HCC) #Relapsed refractory DLBCL-  Triple hit diffuse large B-cell lymphoma; in OCT 2020- S/p CAR-T cell therapy. PET DEC 2023- Stable PET-CT findings. No recurrent lymphoma is identified [ Deauville 1]. Today however she does have some fullness and tenderness of her right axilla without recent illness, immunizations. I have recommended repeat imaging which is agreeable by patient and MD.    # Secondary immunodeficiency -multiple infections [UTI/resipiratory; COVID] s/p IVIG x3- JULY 2024-IgG-228  [trough goal: >400- 500].   Pre-meds: Continue  Dex; Tylenol; benadryl 50 night pror.  Also recommend Tylenol; benadryl 50 night  x3 days after infusion.  Reviewed with primary oncologist today who is agreeable to IVIG treatment tomorrow. Will repeat labs at next follow up; happy to hear that she has not had any recent infections.    # Skin rash postinfusion-no evidence of any anaphylactic reaction.  Also recommend Tylenol; benadryl 50 night  x3 days after infusion- see above.    # Mild Leukopenia/ intremittent neutropenia/lymphopenia- anemia-Hb 10-11-likely secondary to CAR-T therapy; no concern  for any progressive disease. MAY 2024--ferritin 14 saturation-15%-  poor tolerance to gentle iron [iron biglycinate; 28 mg ]-s/p IV Venofer.x2 She wishes to have Venofer today; Hgb 11.8 iron studies pending.    # Prophylactic antibiotics with Valtrex and Bactrim [until CD 4 count > 200]. Repeat  CD4- JULY 2024- 113-Refilled antibiotics.  prophylactic antibiotics- STOP Bactrim M/W/F; valtrex 500 mg/day.    # Peripheral neuropathy/left foot drop-  stable   # ILD-  stable   # Mild Hypocalcemia- Today calcium is 8.7. MAY 2024 vit D levels-55- Continue supplementation.    # Mediport-IV access-stable; port flush.  stable  DISPOSITION: Venofer today IVIG infusion tomorrow PET scan order placed  Follow up in 1 month MD to go over PET results(given prolonged turn around imaging time); port/ labs- cbc/cmp; iron studies; ferritin; D-2 Possible IVIG infusion-- [place]   Orders Placed This Encounter  Procedures   NM PET Image Restag (PS) Skull Base To Thigh    Standing Status:   Future    Standing Expiration Date:   04/30/2024    Order Specific Question:   If indicated for the ordered procedure, I authorize the administration of a radiopharmaceutical per Radiology protocol    Answer:   Yes    Order Specific Question:   Preferred imaging location?    Answer:   [facility]   All questions were answered. The patient knows to call the clinic with any problems, questions or concerns.      Rushie Chestnut, PA-C 05/01/2023 3:37 PM

## 2023-05-02 ENCOUNTER — Inpatient Hospital Stay: Payer: Medicare PPO

## 2023-05-02 VITALS — BP 134/51 | HR 62 | Temp 97.8°F | Resp 18

## 2023-05-02 DIAGNOSIS — D849 Immunodeficiency, unspecified: Secondary | ICD-10-CM | POA: Diagnosis not present

## 2023-05-02 DIAGNOSIS — C8333 Diffuse large B-cell lymphoma, intra-abdominal lymph nodes: Secondary | ICD-10-CM

## 2023-05-02 DIAGNOSIS — E860 Dehydration: Secondary | ICD-10-CM

## 2023-05-02 MED ORDER — IMMUNE GLOBULIN (HUMAN) 10 GM/100ML IV SOLN
400.0000 mg/kg | Freq: Once | INTRAVENOUS | Status: AC
  Administered 2023-05-02: 25 g via INTRAVENOUS
  Filled 2023-05-02: qty 250

## 2023-05-02 MED ORDER — DEXTROSE 5 % IV SOLN
Freq: Once | INTRAVENOUS | Status: AC
  Filled 2023-05-02: qty 250

## 2023-05-02 MED ORDER — ACETAMINOPHEN 325 MG PO TABS
650.0000 mg | ORAL_TABLET | Freq: Once | ORAL | Status: AC
  Administered 2023-05-02: 650 mg via ORAL
  Filled 2023-05-02: qty 2

## 2023-05-02 MED ORDER — MONTELUKAST SODIUM 10 MG PO TABS
10.0000 mg | ORAL_TABLET | Freq: Once | ORAL | Status: AC
  Administered 2023-05-02: 10 mg via ORAL
  Filled 2023-05-02: qty 1

## 2023-05-02 MED ORDER — DIPHENHYDRAMINE HCL 25 MG PO CAPS
50.0000 mg | ORAL_CAPSULE | Freq: Once | ORAL | Status: AC
  Administered 2023-05-02: 50 mg via ORAL
  Filled 2023-05-02: qty 2

## 2023-05-02 MED ORDER — HEPARIN SOD (PORK) LOCK FLUSH 100 UNIT/ML IV SOLN
250.0000 [IU] | Freq: Once | INTRAVENOUS | Status: AC | PRN
  Administered 2023-05-02: 500 [IU]
  Filled 2023-05-02: qty 5

## 2023-05-02 MED ORDER — SODIUM CHLORIDE 0.9 % IV SOLN
10.0000 mg | Freq: Once | INTRAVENOUS | Status: AC
  Administered 2023-05-02: 10 mg via INTRAVENOUS
  Filled 2023-05-02: qty 10

## 2023-05-02 NOTE — Patient Instructions (Signed)

## 2023-05-06 ENCOUNTER — Telehealth: Payer: Self-pay | Admitting: *Deleted

## 2023-05-06 ENCOUNTER — Inpatient Hospital Stay: Payer: Medicare PPO

## 2023-05-06 DIAGNOSIS — D849 Immunodeficiency, unspecified: Secondary | ICD-10-CM | POA: Diagnosis not present

## 2023-05-06 DIAGNOSIS — R300 Dysuria: Secondary | ICD-10-CM

## 2023-05-06 LAB — URINALYSIS, COMPLETE (UACMP) WITH MICROSCOPIC
Bacteria, UA: NONE SEEN
Bilirubin Urine: NEGATIVE
Glucose, UA: NEGATIVE mg/dL
Ketones, ur: NEGATIVE mg/dL
Nitrite: NEGATIVE
Protein, ur: NEGATIVE mg/dL
Specific Gravity, Urine: 1.01 (ref 1.005–1.030)
WBC, UA: >50 WBC/hpf (ref 0–5)
pH: 5 (ref 5.0–8.0)

## 2023-05-06 NOTE — Telephone Encounter (Signed)
I spoke with Dr. Garen Grams to schedule a lab only- u/a and urine culture. I called and spoke with patient. She can come at 230 today for a lab only.  orders entered.

## 2023-05-06 NOTE — Telephone Encounter (Signed)
Message from husband stating that patient needs antibiotics and to please call back. I called back and patient answered call and after confirming her name and dob, I asked her why she felt that she needed antibiotics. She reported that she feels that she has another UTI and she feels terrible. I asked her symptoms and she statest hat she has burning with urination and pelvic pain and that it is not getting any better. Please advise

## 2023-05-07 ENCOUNTER — Telehealth: Payer: Self-pay | Admitting: *Deleted

## 2023-05-07 LAB — URINE CULTURE

## 2023-05-07 MED ORDER — CIPROFLOXACIN HCL 500 MG PO TABS: 500.0000 mg | ORAL_TABLET | Freq: Two times a day (BID) | ORAL | 0 refills | Status: DC

## 2023-05-07 NOTE — Telephone Encounter (Signed)
I reached out to [place] to discuss the u/a results and plan for starting cipro. Pt stated that she wasn't on any bactrim, in fact, she does not have any bactrim at home. She understands that we are still waiting on the final culture results. If her culture shows resistance with cipro, we will call her back and let her know. Otherwise, pt instructed to start Cipro and increase po fluid intake. She gave verbal understanding of the plan of care. She thanked me for calling her.

## 2023-05-07 NOTE — Telephone Encounter (Signed)
Dr. Donneta Romberg- please review the U/A. The patient's culture is still pending.  Moderate hgb urine dipstick Leukocyte-Moderate.

## 2023-05-07 NOTE — Telephone Encounter (Signed)
Heather- Please inform patient that I have sent a prescription for ciprofloxacin-for bladder infection.  Recommend stop Bactrim-   Follow-up as planned. GB

## 2023-05-13 ENCOUNTER — Ambulatory Visit
Admission: RE | Admit: 2023-05-13 | Discharge: 2023-05-13 | Disposition: A | Discharge status: Home / Self Care | Payer: Medicare PPO | Source: Ambulatory Visit | Attending: Medical Oncology | Admitting: Medical Oncology

## 2023-05-13 DIAGNOSIS — R590 Localized enlarged lymph nodes: Secondary | ICD-10-CM | POA: Diagnosis not present

## 2023-05-13 DIAGNOSIS — Z79899 Other long term (current) drug therapy: Secondary | ICD-10-CM | POA: Diagnosis not present

## 2023-05-13 DIAGNOSIS — I2510 Atherosclerotic heart disease of native coronary artery without angina pectoris: Secondary | ICD-10-CM | POA: Insufficient documentation

## 2023-05-13 DIAGNOSIS — D84821 Immunodeficiency due to drugs: Secondary | ICD-10-CM | POA: Diagnosis not present

## 2023-05-13 DIAGNOSIS — K6389 Other specified diseases of intestine: Secondary | ICD-10-CM | POA: Insufficient documentation

## 2023-05-13 DIAGNOSIS — T451X5A Adverse effect of antineoplastic and immunosuppressive drugs, initial encounter: Secondary | ICD-10-CM | POA: Insufficient documentation

## 2023-05-13 DIAGNOSIS — I700 Atherosclerosis of aorta: Secondary | ICD-10-CM | POA: Diagnosis not present

## 2023-05-13 DIAGNOSIS — C8333 Diffuse large B-cell lymphoma, intra-abdominal lymph nodes: Secondary | ICD-10-CM | POA: Insufficient documentation

## 2023-05-13 DIAGNOSIS — M79602 Pain in left arm: Secondary | ICD-10-CM | POA: Diagnosis not present

## 2023-05-13 DIAGNOSIS — K449 Diaphragmatic hernia without obstruction or gangrene: Secondary | ICD-10-CM | POA: Insufficient documentation

## 2023-05-13 DIAGNOSIS — J8410 Pulmonary fibrosis, unspecified: Secondary | ICD-10-CM | POA: Diagnosis not present

## 2023-05-13 LAB — GLUCOSE, CAPILLARY: Glucose-Capillary: 103 mg/dL — ABNORMAL HIGH (ref 70–99)

## 2023-05-13 MED ORDER — FLUDEOXYGLUCOSE F - 18 (FDG) INJECTION
7.1000 | Freq: Once | INTRAVENOUS | Status: AC | PRN
  Administered 2023-05-13: 7.74 via INTRAVENOUS

## 2023-05-28 ENCOUNTER — Other Ambulatory Visit: Payer: Self-pay | Admitting: *Deleted

## 2023-05-28 DIAGNOSIS — C8333 Diffuse large B-cell lymphoma, intra-abdominal lymph nodes: Secondary | ICD-10-CM

## 2023-05-29 ENCOUNTER — Encounter: Payer: Self-pay | Admitting: Internal Medicine

## 2023-05-29 ENCOUNTER — Inpatient Hospital Stay: Payer: Medicare PPO | Attending: Internal Medicine

## 2023-05-29 ENCOUNTER — Inpatient Hospital Stay: Payer: Medicare PPO | Admitting: Internal Medicine

## 2023-05-29 VITALS — BP 147/67 | HR 70 | Temp 98.9°F | Resp 17 | Wt 141.0 lb

## 2023-05-29 DIAGNOSIS — D649 Anemia, unspecified: Secondary | ICD-10-CM | POA: Diagnosis present

## 2023-05-29 DIAGNOSIS — Z8572 Personal history of non-Hodgkin lymphomas: Secondary | ICD-10-CM | POA: Insufficient documentation

## 2023-05-29 DIAGNOSIS — Z95828 Presence of other vascular implants and grafts: Secondary | ICD-10-CM

## 2023-05-29 DIAGNOSIS — D849 Immunodeficiency, unspecified: Secondary | ICD-10-CM | POA: Insufficient documentation

## 2023-05-29 DIAGNOSIS — C8333 Diffuse large B-cell lymphoma, intra-abdominal lymph nodes: Secondary | ICD-10-CM

## 2023-05-29 LAB — CMP (CANCER CENTER ONLY)
ALT: 17 U/L (ref 0–44)
AST: 25 U/L (ref 15–41)
Albumin: 3.6 g/dL (ref 3.5–5.0)
Alkaline Phosphatase: 49 U/L (ref 38–126)
Anion gap: 6 (ref 5–15)
BUN: 15 mg/dL (ref 8–23)
CO2: 27 mmol/L (ref 22–32)
Calcium: 8.6 mg/dL — ABNORMAL LOW (ref 8.9–10.3)
Chloride: 107 mmol/L (ref 98–111)
Creatinine: 0.76 mg/dL (ref 0.44–1.00)
GFR, Estimated: >60 mL/min (ref >60)
Glucose, Bld: 100 mg/dL — ABNORMAL HIGH (ref 70–99)
Potassium: 3.6 mmol/L (ref 3.5–5.1)
Sodium: 140 mmol/L (ref 135–145)
Total Bilirubin: 0.5 mg/dL (ref 0.3–1.2)
Total Protein: 6.3 g/dL — ABNORMAL LOW (ref 6.5–8.1)

## 2023-05-29 LAB — CBC WITH DIFFERENTIAL (CANCER CENTER ONLY)
Abs Immature Granulocytes: 0.31 10*3/uL — ABNORMAL HIGH (ref 0.00–0.07)
Basophils Absolute: 0 10*3/uL (ref 0.0–0.1)
Basophils Relative: 1 %
Eosinophils Absolute: 0.3 10*3/uL (ref 0.0–0.5)
Eosinophils Relative: 9 %
HCT: 34.9 % — ABNORMAL LOW (ref 36.0–46.0)
Hemoglobin: 11.7 g/dL — ABNORMAL LOW (ref 12.0–15.0)
Immature Granulocytes: 9 %
Lymphocytes Relative: 10 %
Lymphs Abs: 0.4 10*3/uL — ABNORMAL LOW (ref 0.7–4.0)
MCH: 32.2 pg (ref 26.0–34.0)
MCHC: 33.5 g/dL (ref 30.0–36.0)
MCV: 96.1 fL (ref 80.0–100.0)
Monocytes Absolute: 0.5 10*3/uL (ref 0.1–1.0)
Monocytes Relative: 14 %
Neutro Abs: 2 10*3/uL (ref 1.7–7.7)
Neutrophils Relative %: 57 %
Platelet Count: 148 10*3/uL — ABNORMAL LOW (ref 150–400)
RBC: 3.63 MIL/uL — ABNORMAL LOW (ref 3.87–5.11)
RDW: 15 % (ref 11.5–15.5)
Smear Review: NORMAL
WBC Count: 3.5 10*3/uL — ABNORMAL LOW (ref 4.0–10.5)
nRBC: 0 % (ref 0.0–0.2)

## 2023-05-29 LAB — FERRITIN: Ferritin: 112 ng/mL (ref 11–307)

## 2023-05-29 LAB — IRON AND TIBC
Iron: 94 ug/dL (ref 28–170)
Saturation Ratios: 30 % (ref 10.4–31.8)
TIBC: 312 ug/dL (ref 250–450)
UIBC: 218 ug/dL

## 2023-05-29 MED ORDER — HEPARIN SOD (PORK) LOCK FLUSH 100 UNIT/ML IV SOLN
500.0000 [IU] | Freq: Once | INTRAVENOUS | Status: AC
  Administered 2023-05-29: 500 [IU] via INTRAVENOUS
  Filled 2023-05-29: qty 5

## 2023-05-29 MED ORDER — SODIUM CHLORIDE 0.9% FLUSH
10.0000 mL | Freq: Once | INTRAVENOUS | Status: AC
  Administered 2023-05-29: 10 mL via INTRAVENOUS
  Filled 2023-05-29: qty 10

## 2023-05-29 NOTE — Assessment & Plan Note (Signed)
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 PET DEC 2023- Stable PET-CT findings. No recurrent lymphoma is identified [ Deauville 1].  PET scan OCT 4th, 2024- NED.  Monitor clinical basis.  # Secondary immunodeficiency -multiple infections [UTI/resipiratory; COVID] s/p IVIG x3- JULY 2024-IgG-228  [trough goal: >400- 500].   Pre-meds: Continue Dex; Tylenol; benadryl 50 night pror.  Also recommend Tylenol; benadryl 50 night  x3 days after infusion. # Recurrent bladder infection:- continue IVIG   # Skin rash post infusion-no evidence of any anaphylactic reaction.  Also recommend Tylenol; benadryl 50 night  x3 days after infusion- see above.   # Mild Leucopenia/ intremittent neutropenia/lymphopenia- anemia-Hb 10-11-likely secondary to CAR-T therapy; no concern for any progressive disease. MAY 2024--ferritin 14 saturation-15%-  poor tolerance to gentle iron [iron biglycinate; 28 mg ]-s/p IV Venofer.x2   # Prophylactic antibiotics with Valtrex- until CD 4 count > 200]. Repeat  CD4- JULY 2024- 113-Refilled antibiotics.  prophylactic antibiotics- STOP Bactrim M/W/F [given resistant UTIs]; will refill valtrex 500 mg/day. Discussed re: shingles vaccine.   # Peripheral neuropathy/left foot drop-  stable  # ILD-  stable; defer to PCP   # Mild Hypocalcemia- MAY 2024 vit D levels-55- stable.   # Mediport-IV access-stable; port flush.  Stable  #Incidental findings on Imaging  {PET-CT , 2024: Moderate hiatal hernia; Aortic atherosclerosis; Coronary artery Calcification I reviewed/discussed/counseled the patient.   # DISPOSITION: # IVIG infusion tomorrow; NO venofer- # follow up in 2  month MD; port/ labs- cbc/cmp; iron studies; ferritin; possible venofer; D-2 Possible IVIG infusion-- Dr.B  # I reviewed the blood work- with the patient in detail; also reviewed the imaging independently [as summarized above]; and with the patient in detail.

## 2023-05-29 NOTE — Progress Notes (Signed)
[facility] OFFICE PROGRESS NOTE  Patient Care Team: Danella Penton, [name] Earna Coder, MD as Consulting Physician (Hematology and Oncology) Stanton Kidney, MD as Consulting Physician (Gastroenterology)   Cancer Staging  No matching staging information was found for the patient.   Oncology History Overview Note  DEC 2017- DLBCL "TRIPLE HIT [myc/ bcl-2/bcl-6 gene rearrangement FISH]" ~10 cm mass RP LN; STAGE II [jan 2018- BMBx-NEG]; Jan 8th R-[facility]OP;   # JAN 29th 2018- DA-R EPOCH x5 ; PET CR; s/p RT [last 03/18/2017]  # FEB-MARCH 2019- RECURRENCE of DLBCL [s/p RP Bx; 4 cm mass inferior to Left Kidney] ; II [facility]; Dr.Grover.   # April 3rd 2019- R-GDP [carbo]; MAY 2019- PET PR; NOV 2019- PR  #August 2020-recurrence bulky left pelvis/retroperitoneal [9-10 cm]; no biopsy. #August 24th-Rituxan weekly cycle #1; prednisone 60 mg a day.   06/03/2019 - Chemotherapy  BMT OP CAR-T LYMPHODEPLETION FLU/CY + IP AXICABTAGENE CILOLEUCEL (YESCARTA) Fludarabine 30 mg/m2 IV Days 1, 2, 3 Cyclophosphamide 500 mg/m2 IV Days 1, 2, 3 Axicabtagene ciloleucel target dose 2 x 10^6 CAR-positive viable T cells per kg body weight (maximum of 2 x 10^8 CAR-positive viable T cells) on Day 6.  # SEP 2020- 82month PET-[facility]- significant PR.   #August 2020-2D echo ejection fraction 60 to 65%  --------------------------------  # JAN 26th-LP  # Interstitial Lung disease [surveillance] --------------------------------------------------------------------------  DIAGNOSIS: [MARCH 2019 ] RECURRENT DLBCL  STAGE:   Recurrent ; GOALS: CURATIVE  CURRENT/MOST RECENT THERAPY: surveillaince   Diffuse large B-cell lymphoma of intra-abdominal lymph nodes (HCC)  04/13/2019 - 04/13/2019 Chemotherapy   The patient had riTUXimab-pvvr (RUXIENCE) 700 mg in sodium chloride 0.9 % 250 mL chemo infusion, , Intravenous, Once, 1 of 4 cycles Administration:  (04/13/2019)   for chemotherapy treatment.    05/07/2019 - 05/07/2019 Chemotherapy   The patient had riTUXimab-hyaluronidase human (RITUXAN HYCELA) 1400-23400 MG -UT/11.7ML injection SQ 1,400 mg, 1,400 mg, Subcutaneous,  Once, 1 of 4 cycles Administration: 1,400 mg (05/07/2019)  for chemotherapy treatment.     INTERVAL HISTORY: Patient is with husband. She ambulating independently.  [place] 77 y.o.  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   Denies any night sweats or fevers. No swollen lymph nodes. Appetite is improved. Energy is not very good. Has lightheadedness a lot. Denies any pain. Intermittent nausea.   In the interim patient had UTI s/p ciprofloxacin. Pt still feels she has continued to have intermittent policy pelvic discomfort and urine is a little darker in color    Has SOB with exertion.  Patient denied any pneumonias.  No hospitalizations.  Neuropathy is stable on the Gabapentin.  Denies any new lumps or bumps. No night sweats.  Review of Systems  Constitutional:  Positive for malaise/fatigue. Negative for chills, diaphoresis, fever and weight loss.  HENT:  Negative for nosebleeds and sore throat.   Eyes:  Negative for double vision.  Respiratory:  Positive for shortness of breath. Negative for cough, hemoptysis, sputum production and wheezing.   Cardiovascular:  Negative for chest pain, palpitations, orthopnea and leg swelling.  Gastrointestinal:  Negative for abdominal pain, blood in stool, constipation, diarrhea, melena and vomiting.  Genitourinary:  Negative for dysuria, frequency and urgency.  Musculoskeletal:  Positive for  joint pain.  Skin: Negative.  Negative for itching and rash.  Neurological:  Positive for tingling. Negative for focal  weakness and weakness.  Endo/Heme/Allergies:  Does not bruise/bleed easily.  Psychiatric/Behavioral:  Negative for depression. The patient is not nervous/anxious and does not have insomnia.     PAST MEDICAL HISTORY :  Past Medical History:  Diagnosis Date   Diabetes mellitus without complication (HCC)    Diffuse large B-cell lymphoma (HCC)    Chemo + rad tx's   Dysplastic nevus 03/14/2020   left inf med scapula moderate atypia    Dysplastic nevus 03/14/2020   left lat breast parallel to areaola, moderate atypia    Heart murmur    History of chemotherapy 07/08/2017   History of gastric ulcer    History of radiation therapy 07/08/2017   Hypercholesterolemia    Hypertension    ILD (interstitial lung disease) (HCC)    8 yrs ago   Lymphadenopathy     PAST SURGICAL HISTORY :   Past Surgical History:  Procedure Laterality Date   BREAST BIOPSY Left 2010   neg   CATARACT EXTRACTION W/PHACO Left 10/16/2017   Procedure: CATARACT EXTRACTION PHACO AND INTRAOCULAR LENS PLACEMENT (IOC) COMPLICATED DIABETIC LEFT;  Surgeon: Lockie Mola, MD;  Location: [facility];  Service: Ophthalmology;  Laterality: Left;  IRIS HOOKS Diabetic - oral meds   COLONOSCOPY N/A 06/19/2021   Procedure: COLONOSCOPY;  Surgeon: Jaynie Collins, DO;  Location: [facility] END[facility];  Service: Gastroenterology;  Laterality: N/A;   ESOPHAGOGASTRODUODENOSCOPY N/A 06/19/2021   Procedure: ESOPHAGOGASTRODUODENOSCOPY (EGD);  Surgeon: Jaynie Collins, DO;  Location: [facility] ENDOSCOPY;  Service: Gastroenterology;  Laterality: N/A;   HERNIA REPAIR     PARTIAL HYSTERECTOMY     age 54   PERIPHERAL VASCULAR CATHETERIZATION N/A 08/22/2016   Procedure: Shelda Pal Cath Insertion;  Surgeon: Renford Dills, MD;  Location: [facility] INVASIVE CV LAB;  Service: Cardiovascular;  Laterality: N/A;   TONSILLECTOMY      FAMILY HISTORY :   Family History  Problem Relation Age of Onset   Heart disease Mother    Heart disease  Father    Heart disease Brother     SOCIAL HISTORY:   Social History   Tobacco Use   Smoking status: Former    Current packs/day: 0.00    Types: Cigarettes    Quit date: 08/21/1958    Years since quitting: 64.8   Smokeless tobacco: Never  Vaping Use   Vaping status: Never Used  Substance Use Topics   Alcohol use: No   Drug use: No    ALLERGIES:  is allergic to levaquin [levofloxacin in d5w], cefuroxime axetil, and doxycycline.  MEDICATIONS:  Current Outpatient Medications  Medication Sig Dispense Refill   acetaminophen (TYLENOL) 325 MG tablet Take by mouth.     b complex vitamins tablet Take 1 tablet by mouth daily.     buPROPion (WELLBUTRIN XL) 150 MG 24 hr tablet Take 150 mg by mouth at bedtime.     gabapentin (NEURONTIN) 300 MG capsule Take 1 capsule (300 mg total) by mouth 2 (two) times daily. (Patient taking differently: Take 300 mg by mouth daily.) 60 capsule 3   ibuprofen (ADVIL) 200 MG tablet Take by mouth.     latanoprost (XALATAN) 0.005 % ophthalmic solution Place 1 drop into both eyes at bedtime.      metoprolol tartrate (LOPRESSOR) 25 MG tablet TAKE 1 TABLET BY MOUTH TWICE DAILY (Patient taking differently: 12.5 mg 2 (two) times daily. (BETA  BLOCKER)) 180 tablet 0   montelukast (SINGULAIR) 10 MG tablet Take 1 tablet (10 mg total) by mouth at bedtime. One a day. 30 tablet 11   ondansetron (ZOFRAN) 8 MG tablet Take by mouth.     pantoprazole (PROTONIX) 40 MG tablet Take 1 tablet by mouth in the morning and at bedtime.     pravastatin (PRAVACHOL) 40 MG tablet Take 20 mg by mouth daily.     prochlorperazine (COMPAZINE) 10 MG tablet Take 10 mg by mouth every 6 (six) hours as needed for nausea or vomiting.     valACYclovir (VALTREX) 500 MG tablet Take 1 tablet (500 mg total) by mouth daily. 90 tablet 1   dexamethasone (DECADRON) 4 MG tablet Take 1 tablet (4 mg total) by mouth 2 (two) times daily. For 1 day; prior to infusion. 30 tablet 0   Magnesium 400 MG TABS Take 1  tablet by mouth daily. (Patient not taking: Reported on 05/29/2023)     No current facility-administered medications for this visit.   Facility-Administered Medications Ordered in Other Visits  Medication Dose Route Frequency Provider Last Rate Last Admin   0.9 %  sodium chloride infusion   Intravenous Continuous Earna Coder, MD 10 mL/hr at 06/19/21 1230 Continued from Pre-op at 06/19/21 1230   0.9 %  sodium chloride infusion   Intravenous Continuous Earna Coder, MD   Stopped at 12/26/16 1011   0.9 %  sodium chloride infusion   Intravenous Continuous Louretta Shorten R, MD 10 mL/hr at 12/27/16 0930 New Bag at 12/27/16 0930   heparin lock flush 100 unit/mL  500 Units Intravenous Once Louretta Shorten R, MD       heparin lock flush 100 unit/mL  500 Units Intravenous Once Louretta Shorten R, MD       sodium chloride flush (NS) 0.9 % injection 10 mL  10 mL Intravenous PRN Louretta Shorten R, MD   10 mL at 11/06/16 1000   sodium chloride flush (NS) 0.9 % injection 10 mL  10 mL Intravenous PRN Earna Coder, MD   10 mL at 11/07/16 0905   sodium chloride flush (NS) 0.9 % injection 10 mL  10 mL Intravenous PRN Earna Coder, MD       sodium chloride flush (NS) 0.9 % injection 10 mL  10 mL Intravenous PRN Louretta Shorten R, MD   10 mL at 12/05/16 1030   sodium chloride flush (NS) 0.9 % injection 10 mL  10 mL Intravenous PRN Louretta Shorten R, MD   10 mL at 12/27/16 0930   sodium chloride flush (NS) 0.9 % injection 10 mL  10 mL Intravenous PRN Earna Coder, MD   10 mL at 09/27/20 1305    PHYSICAL EXAMINATION: ECOG PERFORMANCE STATUS: 1 - Symptomatic but completely ambulatory  BP (!) 147/67   Pulse 70   Temp 98.9 F (37.2 C) (Tympanic)   Resp 17   Wt 141 lb (64 kg)   SpO2 98%   BMI 22.08 kg/m   Filed Weights   05/29/23 1022  Weight: 141 lb (64 kg)     Tenderness left maxillary sinus area  Physical Exam HENT:     Head:  Normocephalic and atraumatic.     Mouth/Throat:     Pharynx: No oropharyngeal exudate.  Eyes:     Pupils: Pupils are equal, round, and reactive to light.  Cardiovascular:     Rate and Rhythm: Normal rate and regular rhythm.  Pulmonary:  Effort: No respiratory distress.     Breath sounds: No wheezing.     Comments: Bilateral crackles at the bases.  No wheeze. Abdominal:     General: Bowel sounds are normal. There is no distension.     Palpations: Abdomen is soft. There is no mass.     Tenderness: There is no abdominal tenderness. There is no guarding or rebound.  Musculoskeletal:        General: No tenderness. Normal range of motion.     Cervical back: Normal range of motion and neck supple.  Skin:    General: Skin is warm.  Neurological:     Mental Status: She is alert and oriented to person, place, and time.  Psychiatric:        Mood and Affect: Affect normal.    LABORATORY DATA:  I have reviewed the data as listed    Component Value Date/Time   NA 140 05/29/2023 0955   K 3.6 05/29/2023 0955   K 4.1 07/01/2014 1429   CL 107 05/29/2023 0955   CO2 27 05/29/2023 0955   GLUCOSE 100 (H) 05/29/2023 0955   BUN 15 05/29/2023 0955   CREATININE 0.76 05/29/2023 0955   CALCIUM 8.6 (L) 05/29/2023 0955   PROT 6.3 (L) 05/29/2023 0955   ALBUMIN 3.6 05/29/2023 0955   AST 25 05/29/2023 0955   ALT 17 05/29/2023 0955   ALKPHOS 49 05/29/2023 0955   BILITOT 0.5 05/29/2023 0955   GFRNONAA >60 05/29/2023 0955   GFRAA >60 03/28/2020 1305    No results found for: "SPEP", "UPEP"  Lab Results  Component Value Date   WBC 3.5 (L) 05/29/2023   NEUTROABS PENDING 05/29/2023   HGB 11.7 (L) 05/29/2023   HCT 34.9 (L) 05/29/2023   MCV 96.1 05/29/2023   PLT 148 (L) 05/29/2023      Chemistry      Component Value Date/Time   NA 140 05/29/2023 0955   K 3.6 05/29/2023 0955   K 4.1 07/01/2014 1429   CL 107 05/29/2023 0955   CO2 27 05/29/2023 0955   BUN 15 05/29/2023 0955   CREATININE  0.76 05/29/2023 0955      Component Value Date/Time   CALCIUM 8.6 (L) 05/29/2023 0955   ALKPHOS 49 05/29/2023 0955   AST 25 05/29/2023 0955   ALT 17 05/29/2023 0955   BILITOT 0.5 05/29/2023 0955       RADIOGRAPHIC STUDIES: I have personally reviewed the radiological images as listed and agreed with the findings in the report. No results found.   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 PET DEC 2023- Stable PET-CT findings. No recurrent lymphoma is identified [ [place] 1].  PET scan OCT 4th, 2024- NED.  Monitor clinical basis.  # Secondary immunodeficiency -multiple infections [UTI/resipiratory; COVID] s/p IVIG x3- JULY 2024-IgG-228  [trough goal: >400- 500].   Pre-meds: Continue Dex; Tylenol; benadryl 50 night pror.  Also recommend Tylenol; benadryl 50 night  x3 days after infusion. # Recurrent bladder infection:- continue IVIG   # Skin rash post infusion-no evidence of any anaphylactic reaction.  Also recommend Tylenol; benadryl 50 night  x3 days after infusion- see above.   # Mild Leucopenia/ intremittent neutropenia/lymphopenia- anemia-Hb 10-11-likely secondary to CAR-T therapy; no concern for any progressive disease. MAY 2024--ferritin 14 saturation-15%-  poor tolerance to gentle iron [iron biglycinate; 28 mg ]-s/p IV Venofer.x2   #  Prophylactic antibiotics with Valtrex- until CD 4 count > 200]. Repeat  CD4- JULY 2024- 113-Refilled antibiotics.  prophylactic antibiotics- STOP Bactrim M/W/F [given resistant UTIs]; will refill valtrex 500 mg/day. Discussed re: shingles vaccine.   # Peripheral neuropathy/left foot drop-  stable  # ILD-  stable; defer to PCP   # Mild Hypocalcemia- MAY 2024 vit D levels-55- stable.   # Mediport-IV access-stable; port flush.  Stable  #Incidental findings on Imaging  {PET-CT , 2024: Moderate  hiatal hernia; Aortic atherosclerosis; Coronary artery Calcification I reviewed/discussed/counseled the patient.   # DISPOSITION: # IVIG infusion tomorrow; NO venofer- # follow up in 2  month MD; port/ labs- cbc/cmp; iron studies; ferritin; possible venofer; D-2 Possible IVIG infusion-- Dr.B  # I reviewed the blood work- with the patient in detail; also reviewed the imaging independently [as summarized above]; and with the patient in detail.       No orders of the defined types were placed in this encounter.  All questions were answered. The patient knows to call the clinic with any problems, questions or concerns.      Earna Coder, MD 05/29/2023 10:47 AM

## 2023-05-29 NOTE — Addendum Note (Signed)
Addended by: Darrold Span A on: 05/29/2023 11:19 AM   Modules accepted: Orders

## 2023-05-29 NOTE — Progress Notes (Signed)
Denies any night sweats or fevers. No swollen lymph nodes. Appetite is improved. Energy is not very good. Has lightheadedness a lot. Denies any pain. Intermittent nausea. Pt still feels she has lingering UTI with pelvic discomfort and urine is a little darker in color.

## 2023-05-30 ENCOUNTER — Inpatient Hospital Stay: Payer: Medicare PPO

## 2023-05-30 VITALS — BP 128/58 | HR 65 | Temp 98.2°F | Resp 18

## 2023-05-30 DIAGNOSIS — C8333 Diffuse large B-cell lymphoma, intra-abdominal lymph nodes: Secondary | ICD-10-CM

## 2023-05-30 DIAGNOSIS — D849 Immunodeficiency, unspecified: Secondary | ICD-10-CM | POA: Diagnosis not present

## 2023-05-30 DIAGNOSIS — Z95828 Presence of other vascular implants and grafts: Secondary | ICD-10-CM

## 2023-05-30 DIAGNOSIS — E860 Dehydration: Secondary | ICD-10-CM

## 2023-05-30 MED ORDER — SODIUM CHLORIDE 0.9 % IV SOLN
10.0000 mg | Freq: Once | INTRAVENOUS | Status: AC
  Administered 2023-05-30: 10 mg via INTRAVENOUS
  Filled 2023-05-30: qty 10

## 2023-05-30 MED ORDER — HEPARIN SOD (PORK) LOCK FLUSH 100 UNIT/ML IV SOLN
500.0000 [IU] | Freq: Once | INTRAVENOUS | Status: AC
  Administered 2023-05-30: 500 [IU] via INTRAVENOUS
  Filled 2023-05-30: qty 5

## 2023-05-30 MED ORDER — MONTELUKAST SODIUM 10 MG PO TABS
10.0000 mg | ORAL_TABLET | Freq: Once | ORAL | Status: AC
  Administered 2023-05-30: 10 mg via ORAL
  Filled 2023-05-30: qty 1

## 2023-05-30 MED ORDER — ACETAMINOPHEN 325 MG PO TABS
650.0000 mg | ORAL_TABLET | Freq: Once | ORAL | Status: AC
  Administered 2023-05-30: 650 mg via ORAL
  Filled 2023-05-30: qty 2

## 2023-05-30 MED ORDER — DEXTROSE 5 % IV SOLN
Freq: Once | INTRAVENOUS | Status: AC
  Filled 2023-05-30: qty 250

## 2023-05-30 MED ORDER — IMMUNE GLOBULIN (HUMAN) 10 GM/100ML IV SOLN
400.0000 mg/kg | Freq: Once | INTRAVENOUS | Status: AC
  Administered 2023-05-30: 25 g via INTRAVENOUS
  Filled 2023-05-30: qty 250

## 2023-05-30 MED ORDER — DIPHENHYDRAMINE HCL 25 MG PO CAPS
50.0000 mg | ORAL_CAPSULE | Freq: Once | ORAL | Status: AC
  Administered 2023-05-30: 50 mg via ORAL
  Filled 2023-05-30: qty 2

## 2023-05-30 NOTE — Patient Instructions (Signed)
[facility] [facility]  Discharge Instructions: Thank you for choosing [facility] to provide your oncology and hematology care.  If you have a lab appointment with the Cancer Center, please go directly to the Cancer Center and check in at the registration area.  Wear comfortable clothing and clothing appropriate for easy access to any Portacath or PICC line.   We strive to give you quality time with your provider. You may need to reschedule your appointment if you arrive late (15 or more minutes).  Arriving late affects you and other patients whose appointments are after yours.  Also, if you miss three or more appointments without notifying the office, you may be dismissed from the clinic at the provider's discretion.      For prescription refill requests, have your pharmacy contact our office and allow 72 hours for refills to be completed.    Today you received the following chemotherapy and/or immunotherapy agents IVIG      To help prevent nausea and vomiting after your treatment, we encourage you to take your nausea medication as directed.  BELOW ARE SYMPTOMS THAT SHOULD BE REPORTED IMMEDIATELY: *FEVER GREATER THAN 100.4 F (38 C) OR HIGHER *CHILLS OR SWEATING *NAUSEA AND VOMITING THAT IS NOT CONTROLLED WITH YOUR NAUSEA MEDICATION *UNUSUAL SHORTNESS OF BREATH *UNUSUAL BRUISING OR BLEEDING *URINARY PROBLEMS (pain or burning when urinating, or frequent urination) *BOWEL PROBLEMS (unusual diarrhea, constipation, pain near the anus) TENDERNESS IN MOUTH AND THROAT WITH OR WITHOUT PRESENCE OF ULCERS (sore throat, sores in mouth, or a toothache) UNUSUAL RASH, SWELLING OR PAIN  UNUSUAL VAGINAL DISCHARGE OR ITCHING   Items with * indicate a potential emergency and should be followed up as soon as possible or go to the Emergency Department if any problems should occur.  Please show the CHEMOTHERAPY ALERT CARD or IMMUNOTHERAPY ALERT CARD at check-in to the  Emergency Department and triage nurse.  Should you have questions after your visit or need to cancel or reschedule your appointment, please contact [place] [facility]  [phone] and follow the prompts.  Office hours are 8:00 a.m. to 4:30 p.m. Monday - Friday. Please note that voicemails left after 4:00 p.m. may not be returned until the following business day.  We are closed weekends and major holidays. You have access to a nurse at all times for urgent questions. Please call the main number to the clinic [phone] and follow the prompts.  For any non-urgent questions, you may also contact your provider using MyChart. We now offer e-Visits for anyone 65 and older to request care online for non-urgent symptoms. For details visit [email or web address].   Also download the MyChart app! Go to the app store, search "MyChart", open the app, select [place], and log in with your MyChart username and password.   Immune Globulin Injection What is this medication? IMMUNE GLOBULIN (im MUNE  GLOB yoo lin) helps to prevent or reduce the severity of certain infections in patients who are at risk. This medicine is collected from the pooled blood of many donors. It is used to treat immune system problems, thrombocytopenia, and Kawasaki syndrome. This medicine may be used for other purposes; ask your health care provider or pharmacist if you have questions. This medicine may be used for other purposes; ask your health care provider or pharmacist if you have questions. COMMON BRAND NAME(S): ASCENIV, Baygam, BIVIGAM, Carimune, Carimune NF, cutaquig, Cuvitru, Flebogamma, Flebogamma DIF, GamaSTAN, GamaSTAN S/D, Gamimune N, Gammagard, Gammagard  S/D, Gammaked, Gammaplex, Gammar-P IV, Gamunex, Gamunex-C, Hizentra, Iveegam, Iveegam EN, Octagam, Panglobulin, Panglobulin NF, panzyga, Polygam S/D, Privigen, Sandoglobulin, Venoglobulin-S, Vigam, Vivaglobulin, Xembify What should I tell my  care team before I take this medication? They need to know if you have any of these conditions: diabetes extremely low or no immune antibodies in the blood heart disease history of blood clots hyperprolinemia infection in the blood, sepsis kidney disease recently received or scheduled to receive a vaccination an unusual or allergic reaction to human immune globulin, albumin, maltose, sucrose, other medicines, foods, dyes, or preservatives pregnant or trying to get pregnant breast-feeding How should I use this medication? This medicine is for injection into a muscle or infusion into a vein or skin. It is usually given by a health care professional in a hospital or clinic setting. In rare cases, some brands of this medicine might be given at home. You will be taught how to give this medicine. Use exactly as directed. Take your medicine at regular intervals. Do not take your medicine more often than directed. Talk to your pediatrician regarding the use of this medicine in children. While this drug may be prescribed for selected conditions, precautions do apply. Overdosage: If you think you have taken too much of this medicine contact a poison control center or emergency room at once. NOTE: This medicine is only for you. Do not share this medicine with others. Overdosage: If you think you have taken too much of this medicine contact a poison control center or emergency room at once. NOTE: This medicine is only for you. Do not share this medicine with others. What if I miss a dose? It is important not to miss your dose. Call your doctor or health care professional if you are unable to keep an appointment. If you give yourself the medicine and you miss a dose, take it as soon as you can. If it is almost time for your next dose, take only that dose. Do not take double or extra doses. What may interact with this medication? aspirin and aspirin-like medicines cisplatin cyclosporine medicines for  infection like acyclovir, adefovir, amphotericin B, bacitracin, cidofovir, foscarnet, ganciclovir, gentamicin, pentamidine, vancomycin NSAIDS, medicines for pain and inflammation, like ibuprofen or naproxen pamidronate vaccines zoledronic acid This list may not describe all possible interactions. Give your health care provider a list of all the medicines, herbs, non-prescription drugs, or dietary supplements you use. Also tell them if you smoke, drink alcohol, or use illegal drugs. Some items may interact with your medicine. This list may not describe all possible interactions. Give your health care provider a list of all the medicines, herbs, non-prescription drugs, or dietary supplements you use. Also tell them if you smoke, drink alcohol, or use illegal drugs. Some items may interact with your medicine. What should I watch for while using this medication? Your condition will be monitored carefully while you are receiving this medicine. This medicine is made from pooled blood donations of many different people. It may be possible to pass an infection in this medicine. However, the donors are screened for infections and all products are tested for HIV and hepatitis. The medicine is treated to kill most or all bacteria and viruses. Talk to your doctor about the risks and benefits of this medicine. Do not have vaccinations for at least 14 days before, or until at least 3 months after receiving this medicine. What side effects may I notice from receiving this medication? Side effects that you should report to  your doctor or health care professional as soon as possible: allergic reactions like skin rash, itching or hives, swelling of the face, lips, or tongue blue colored lips or skin breathing problems chest pain or tightness fever signs and symptoms of aseptic meningitis such as stiff neck; sensitivity to light; headache; drowsiness; fever; nausea; vomiting; rash signs and symptoms of a blood clot  such as chest pain; shortness of breath; pain, swelling, or warmth in the leg signs and symptoms of hemolytic anemia such as fast heartbeat; tiredness; dark yellow or brown urine; or yellowing of the eyes or skin signs and symptoms of kidney injury like trouble passing urine or change in the amount of urine sudden weight gain swelling of the ankles, feet, hands Side effects that usually do not require medical attention (report to your doctor or health care professional if they continue or are bothersome): diarrhea flushing headache increased sweating joint pain muscle cramps muscle pain nausea pain, redness, or irritation at site where injected tiredness This list may not describe all possible side effects. Call your doctor for medical advice about side effects. You may report side effects to FDA at 1-800-FDA-1088. This list may not describe all possible side effects. Call your doctor for medical advice about side effects. You may report side effects to FDA at 1-800-FDA-1088. Where should I keep my medication? Keep out of the reach of children. This drug is usually given in a hospital or clinic and will not be stored at home. In rare cases, some brands of this medicine may be given at home. If you are using this medicine at home, you will be instructed on how to store this medicine. Throw away any unused medicine after the expiration date on the label. NOTE: This sheet is a summary. It may not cover all possible information. If you have questions about this medicine, talk to your doctor, pharmacist, or health care provider.  2024 Elsevier/Gold Standard (2019-03-11 00:00:00)

## 2023-06-02 LAB — IMMUNOGLOBULINS A/E/G/M, SERUM
IgA: 19 mg/dL — ABNORMAL LOW (ref 64–422)
IgE (Immunoglobulin E), Serum: <2 [IU]/mL — ABNORMAL LOW (ref 6–495)
IgG (Immunoglobin G), Serum: 588 mg/dL (ref 586–1602)
IgM (Immunoglobulin M), Srm: <5 mg/dL — ABNORMAL LOW (ref 26–217)

## 2023-06-21 ENCOUNTER — Other Ambulatory Visit: Payer: Self-pay | Admitting: Internal Medicine

## 2023-06-21 DIAGNOSIS — C8303 Small cell B-cell lymphoma, intra-abdominal lymph nodes: Secondary | ICD-10-CM

## 2023-06-21 DIAGNOSIS — I700 Atherosclerosis of aorta: Secondary | ICD-10-CM

## 2023-06-24 ENCOUNTER — Ambulatory Visit
Admission: RE | Admit: 2023-06-24 | Discharge: 2023-06-24 | Disposition: A | Discharge status: Home / Self Care | Payer: Medicare PPO | Source: Ambulatory Visit | Attending: Internal Medicine | Admitting: Internal Medicine

## 2023-06-24 DIAGNOSIS — C8303 Small cell B-cell lymphoma, intra-abdominal lymph nodes: Secondary | ICD-10-CM | POA: Insufficient documentation

## 2023-06-24 DIAGNOSIS — I700 Atherosclerosis of aorta: Secondary | ICD-10-CM | POA: Insufficient documentation

## 2023-07-29 ENCOUNTER — Inpatient Hospital Stay: Payer: Medicare PPO | Attending: Internal Medicine

## 2023-07-29 ENCOUNTER — Inpatient Hospital Stay: Payer: Medicare PPO

## 2023-07-29 ENCOUNTER — Inpatient Hospital Stay: Payer: Medicare PPO | Admitting: Internal Medicine

## 2023-07-29 ENCOUNTER — Encounter: Payer: Self-pay | Admitting: Internal Medicine

## 2023-07-29 VITALS — BP 131/69 | HR 73 | Temp 98.8°F | Resp 17 | Wt 139.8 lb

## 2023-07-29 DIAGNOSIS — C8333 Diffuse large B-cell lymphoma, intra-abdominal lymph nodes: Secondary | ICD-10-CM

## 2023-07-29 DIAGNOSIS — Z79624 Long term (current) use of inhibitors of nucleotide synthesis: Secondary | ICD-10-CM | POA: Diagnosis not present

## 2023-07-29 DIAGNOSIS — D649 Anemia, unspecified: Secondary | ICD-10-CM

## 2023-07-29 DIAGNOSIS — D849 Immunodeficiency, unspecified: Secondary | ICD-10-CM | POA: Diagnosis present

## 2023-07-29 DIAGNOSIS — Z7952 Long term (current) use of systemic steroids: Secondary | ICD-10-CM | POA: Insufficient documentation

## 2023-07-29 DIAGNOSIS — Z8572 Personal history of non-Hodgkin lymphomas: Secondary | ICD-10-CM | POA: Insufficient documentation

## 2023-07-29 DIAGNOSIS — Z79899 Other long term (current) drug therapy: Secondary | ICD-10-CM | POA: Diagnosis not present

## 2023-07-29 DIAGNOSIS — Z87891 Personal history of nicotine dependence: Secondary | ICD-10-CM | POA: Diagnosis not present

## 2023-07-29 LAB — CBC WITH DIFFERENTIAL (CANCER CENTER ONLY)
Abs Immature Granulocytes: 0.08 10*3/uL — ABNORMAL HIGH (ref 0.00–0.07)
Basophils Absolute: 0.1 10*3/uL (ref 0.0–0.1)
Basophils Relative: 1 %
Eosinophils Absolute: 0.4 10*3/uL (ref 0.0–0.5)
Eosinophils Relative: 10 %
HCT: 39.5 % (ref 36.0–46.0)
Hemoglobin: 13 g/dL (ref 12.0–15.0)
Immature Granulocytes: 2 %
Lymphocytes Relative: 13 %
Lymphs Abs: 0.5 10*3/uL — ABNORMAL LOW (ref 0.7–4.0)
MCH: 32.5 pg (ref 26.0–34.0)
MCHC: 32.9 g/dL (ref 30.0–36.0)
MCV: 98.8 fL (ref 80.0–100.0)
Monocytes Absolute: 0.5 10*3/uL (ref 0.1–1.0)
Monocytes Relative: 11 %
Neutro Abs: 2.6 10*3/uL (ref 1.7–7.7)
Neutrophils Relative %: 63 %
Platelet Count: 196 10*3/uL (ref 150–400)
RBC: 4 MIL/uL (ref 3.87–5.11)
RDW: 12.9 % (ref 11.5–15.5)
WBC Count: 4.2 10*3/uL (ref 4.0–10.5)
nRBC: 0 % (ref 0.0–0.2)

## 2023-07-29 LAB — CMP (CANCER CENTER ONLY)
ALT: 26 U/L (ref 0–44)
AST: 32 U/L (ref 15–41)
Albumin: 4.1 g/dL (ref 3.5–5.0)
Alkaline Phosphatase: 63 U/L (ref 38–126)
Anion gap: 11 (ref 5–15)
BUN: 16 mg/dL (ref 8–23)
CO2: 26 mmol/L (ref 22–32)
Calcium: 9.2 mg/dL (ref 8.9–10.3)
Chloride: 103 mmol/L (ref 98–111)
Creatinine: 0.55 mg/dL (ref 0.44–1.00)
GFR, Estimated: >60 mL/min (ref >60)
Glucose, Bld: 101 mg/dL — ABNORMAL HIGH (ref 70–99)
Potassium: 4.2 mmol/L (ref 3.5–5.1)
Sodium: 140 mmol/L (ref 135–145)
Total Bilirubin: 0.6 mg/dL (ref <1.2)
Total Protein: 6.8 g/dL (ref 6.5–8.1)

## 2023-07-29 LAB — FERRITIN: Ferritin: 53 ng/mL (ref 11–307)

## 2023-07-29 LAB — IRON AND TIBC
Iron: 72 ug/dL (ref 28–170)
Saturation Ratios: 20 % (ref 10.4–31.8)
TIBC: 356 ug/dL (ref 250–450)
UIBC: 284 ug/dL

## 2023-07-29 MED ORDER — ALTEPLASE 2 MG IJ SOLR
2.0000 mg | Freq: Once | INTRAMUSCULAR | Status: AC
Start: 2023-07-29 — End: 2023-07-29
  Administered 2023-07-29: 2 mg
  Filled 2023-07-29: qty 2

## 2023-07-29 MED ORDER — HEPARIN SOD (PORK) LOCK FLUSH 100 UNIT/ML IV SOLN
500.0000 [IU] | Freq: Once | INTRAVENOUS | Status: AC
  Administered 2023-07-29: 500 [IU] via INTRAVENOUS
  Filled 2023-07-29: qty 5

## 2023-07-29 NOTE — Progress Notes (Unsigned)
No venofer needed today. Checked for blood return prior to deaccessing needle. Excellent blood return noted post cath flo.

## 2023-07-29 NOTE — Progress Notes (Signed)
[name]  Patient Care Team: Danella Penton, [name] Earna Coder, MD as Consulting Physician (Hematology and Oncology) Stanton Kidney, MD as Consulting Physician (Gastroenterology)   Cancer Staging  No matching staging information was found for the patient.   Oncology History Overview Note  DEC 2017- DLBCL "TRIPLE HIT [myc/ bcl-2/bcl-6 gene rearrangement FISH]" ~10 cm mass RP LN; STAGE II [jan 2018- BMBx-NEG]; Jan 8th R-[facility]OP;   # JAN 29th 2018- DA-R EPOCH x5 ; PET CR; s/p RT [last 03/18/2017]  # FEB-MARCH 2019- RECURRENCE of DLBCL [s/p RP Bx; 4 cm mass inferior to Left Kidney] ; II [facility]; Dr.Grover.   # April 3rd 2019- R-GDP [carbo]; MAY 2019- PET PR; NOV 2019- PR  #August 2020-recurrence bulky left pelvis/retroperitoneal [9-10 cm]; no biopsy. #August 24th-Rituxan weekly cycle #1; prednisone 60 mg a day.   06/03/2019 - Chemotherapy  BMT OP CAR-T LYMPHODEPLETION FLU/CY + IP AXICABTAGENE CILOLEUCEL (YESCARTA) Fludarabine 30 mg/m2 IV Days 1, 2, 3 Cyclophosphamide 500 mg/m2 IV Days 1, 2, 3 Axicabtagene ciloleucel target dose 2 x 10^6 CAR-positive viable T cells per kg body weight (maximum of 2 x 10^8 CAR-positive viable T cells) on Day 6.  # SEP 2020- 79month PET-[facility]- significant PR.   #August 2020-2D echo ejection fraction 60 to 65%  --------------------------------  # JAN 26th-LP  # Interstitial Lung disease [surveillance] --------------------------------------------------------------------------  DIAGNOSIS: [MARCH 2019 ] RECURRENT DLBCL  STAGE:   Recurrent ; GOALS: CURATIVE  CURRENT/MOST RECENT THERAPY: surveillaince   Diffuse large B-cell lymphoma of intra-abdominal lymph nodes (HCC)  04/13/2019 - 04/13/2019 Chemotherapy   The patient had riTUXimab-pvvr (RUXIENCE) 700 mg in sodium chloride 0.9 % 250 mL chemo infusion, , Intravenous, Once, 1 of 4 cycles Administration:  (04/13/2019)   for chemotherapy treatment.    05/07/2019 - 05/07/2019 Chemotherapy   The patient had riTUXimab-hyaluronidase human (RITUXAN HYCELA) 1400-23400 MG -UT/11.7ML injection SQ 1,400 mg, 1,400 mg, Subcutaneous,  Once, 1 of 4 cycles Administration: 1,400 mg (05/07/2019)  for chemotherapy treatment.     INTERVAL HISTORY: Patient is with husband. She ambulating independently.  [place] 77 y.o.  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  Pt denies fevers or night sweats. No swollen lymph nodes. Does have fatigue. Takes imodium for diarrhea with IBS. Occ achiness of joints. Mild cough and dyspnea. Does have some dizziness. No visible blood in stool  Recently diagnosed with UTI currently on antibiotics.   Has SOB with exertion.  Patient denied any pneumonias.  No hospitalizations.  Neuropathy is stable on the Gabapentin.  Denies any new lumps or bumps. No night sweats.  Review of Systems  Constitutional:  Positive for malaise/fatigue. Negative for chills, diaphoresis, fever and weight loss.  HENT:  Negative for nosebleeds and sore throat.   Eyes:  Negative for double vision.  Respiratory:  Positive for shortness of breath. Negative for cough, hemoptysis, sputum production and wheezing.   Cardiovascular:  Negative for chest pain, palpitations, orthopnea and leg swelling.  Gastrointestinal:  Negative for abdominal pain, blood in stool, constipation, diarrhea, melena and vomiting.  Genitourinary:  Negative for dysuria, frequency and urgency.  Musculoskeletal:  Positive for joint pain.  Skin: Negative.  Negative for itching and rash.  Neurological:  Positive for tingling. Negative for focal weakness and  weakness.  Endo/Heme/Allergies:  Does not bruise/bleed easily.  Psychiatric/Behavioral:   Negative for depression. The patient is not nervous/anxious and does not have insomnia.     PAST MEDICAL HISTORY :  Past Medical History:  Diagnosis Date   Diabetes mellitus without complication (HCC)    Diffuse large B-cell lymphoma (HCC)    Chemo + rad tx's   Dysplastic nevus 03/14/2020   left inf med scapula moderate atypia    Dysplastic nevus 03/14/2020   left lat breast parallel to areaola, moderate atypia    Heart murmur    History of chemotherapy 07/08/2017   History of gastric ulcer    History of radiation therapy 07/08/2017   Hypercholesterolemia    Hypertension    ILD (interstitial lung disease) (HCC)    8 yrs ago   Lymphadenopathy     PAST SURGICAL HISTORY :   Past Surgical History:  Procedure Laterality Date   BREAST BIOPSY Left 2010   neg   CATARACT EXTRACTION W/PHACO Left 10/16/2017   Procedure: CATARACT EXTRACTION PHACO AND INTRAOCULAR LENS PLACEMENT (IOC) COMPLICATED DIABETIC LEFT;  Surgeon: Lockie Mola, MD;  Location: [facility];  Service: Ophthalmology;  Laterality: Left;  IRIS HOOKS Diabetic - oral meds   COLONOSCOPY N/A 06/19/2021   Procedure: COLONOSCOPY;  Surgeon: Jaynie Collins, DO;  Location: [facility] [place] END[facility];  Service: Gastroenterology;  Laterality: N/A;   ESOPHAGOGASTRODUODENOSCOPY N/A 06/19/2021   Procedure: ESOPHAGOGASTRODUODENOSCOPY (EGD);  Surgeon: Jaynie Collins, DO;  Location: [facility] ENDOSCOPY;  Service: Gastroenterology;  Laterality: N/A;   HERNIA REPAIR     PARTIAL HYSTERECTOMY     age 63   PERIPHERAL VASCULAR CATHETERIZATION N/A 08/22/2016   Procedure: Shelda Pal Cath Insertion;  Surgeon: Renford Dills, MD;  Location: [facility] INVASIVE CV LAB;  Service: Cardiovascular;  Laterality: N/A;   TONSILLECTOMY      FAMILY HISTORY :   Family History  Problem Relation Age of Onset   Heart disease Mother    Heart disease Father    Heart disease Brother     SOCIAL HISTORY:   Social History   Tobacco Use   Smoking  status: Former    Current packs/day: 0.00    Types: Cigarettes    Quit date: 08/21/1958    Years since quitting: 64.9   Smokeless tobacco: Never  Vaping Use   Vaping status: Never Used  Substance Use Topics   Alcohol use: No   Drug use: No    ALLERGIES:  is allergic to levaquin [levofloxacin in d5w], cefuroxime axetil, and doxycycline.  MEDICATIONS:  Current Outpatient Medications  Medication Sig Dispense Refill   b complex vitamins tablet Take 1 tablet by mouth daily.     buPROPion (WELLBUTRIN XL) 150 MG 24 hr tablet Take 150 mg by mouth at bedtime.     dexamethasone (DECADRON) 4 MG tablet Take 1 tablet (4 mg total) by mouth 2 (two) times daily. For 1 day; prior to infusion. 30 tablet 0   gabapentin (NEURONTIN) 300 MG capsule Take 1 capsule (300 mg total) by mouth 2 (two) times daily. (Patient taking differently: Take 300 mg by mouth daily.) 60 capsule 3   ibuprofen (ADVIL) 200 MG tablet Take by mouth.     latanoprost (XALATAN) 0.005 % ophthalmic solution Place 1 drop into both eyes at bedtime.      Magnesium 400 MG TABS Take 1 tablet by mouth daily.     metoprolol tartrate (LOPRESSOR) 25 MG tablet TAKE 1 TABLET BY MOUTH TWICE DAILY (Patient taking  differently: 12.5 mg 2 (two) times daily. (BETA BLOCKER)) 180 tablet 0   montelukast (SINGULAIR) 10 MG tablet Take 1 tablet (10 mg total) by mouth at bedtime. One a day. 30 tablet 11   pantoprazole (PROTONIX) 40 MG tablet Take 1 tablet by mouth in the morning and at bedtime.     rosuvastatin (CRESTOR) 10 MG tablet Take 1 tablet by mouth daily.     valACYclovir (VALTREX) 500 MG tablet Take 1 tablet (500 mg total) by mouth daily. 90 tablet 1   acetaminophen (TYLENOL) 325 MG tablet Take by mouth.     ondansetron (ZOFRAN) 8 MG tablet Take by mouth. (Patient not taking: Reported on 07/29/2023)     prochlorperazine (COMPAZINE) 10 MG tablet Take 10 mg by mouth every 6 (six) hours as needed for nausea or vomiting. (Patient not taking: Reported on  07/29/2023)     No current facility-administered medications for this visit.   Facility-Administered Medications Ordered in Other Visits  Medication Dose Route Frequency Provider Last Rate Last Admin   0.9 %  sodium chloride infusion   Intravenous Continuous Earna Coder, MD 10 mL/hr at 06/19/21 1230 Continued from Pre-op at 06/19/21 1230   0.9 %  sodium chloride infusion   Intravenous Continuous Earna Coder, MD   Stopped at 12/26/16 1011   0.9 %  sodium chloride infusion   Intravenous Continuous Louretta Shorten R, MD 10 mL/hr at 12/27/16 0930 New Bag at 12/27/16 0930   heparin lock flush 100 unit/mL  500 Units Intravenous Once Louretta Shorten R, MD       heparin lock flush 100 unit/mL  500 Units Intravenous Once Louretta Shorten R, MD       sodium chloride flush (NS) 0.9 % injection 10 mL  10 mL Intravenous PRN Louretta Shorten R, MD   10 mL at 11/06/16 1000   sodium chloride flush (NS) 0.9 % injection 10 mL  10 mL Intravenous PRN Earna Coder, MD   10 mL at 11/07/16 0905   sodium chloride flush (NS) 0.9 % injection 10 mL  10 mL Intravenous PRN Earna Coder, MD       sodium chloride flush (NS) 0.9 % injection 10 mL  10 mL Intravenous PRN Louretta Shorten R, MD   10 mL at 12/05/16 1030   sodium chloride flush (NS) 0.9 % injection 10 mL  10 mL Intravenous PRN Louretta Shorten R, MD   10 mL at 12/27/16 0930   sodium chloride flush (NS) 0.9 % injection 10 mL  10 mL Intravenous PRN Earna Coder, MD   10 mL at 09/27/20 1305    PHYSICAL EXAMINATION: ECOG PERFORMANCE STATUS: 1 - Symptomatic but completely ambulatory  BP 131/69   Pulse 73   Temp 98.8 F (37.1 C) (Tympanic)   Resp 17   Wt 139 lb 12.8 oz (63.4 kg)   SpO2 100%   BMI 21.90 kg/m   Filed Weights   07/29/23 1503  Weight: 139 lb 12.8 oz (63.4 kg)      Tenderness left maxillary sinus area  Physical Exam HENT:     Head: Normocephalic and atraumatic.      Mouth/Throat:     Pharynx: No oropharyngeal exudate.  Eyes:     Pupils: Pupils are equal, round, and reactive to light.  Cardiovascular:     Rate and Rhythm: Normal rate and regular rhythm.  Pulmonary:     Effort: No respiratory distress.     Breath sounds: No  wheezing.     Comments: Bilateral crackles at the bases.  No wheeze. Abdominal:     General: Bowel sounds are normal. There is no distension.     Palpations: Abdomen is soft. There is no mass.     Tenderness: There is no abdominal tenderness. There is no guarding or rebound.  Musculoskeletal:        General: No tenderness. Normal range of motion.     Cervical back: Normal range of motion and neck supple.  Skin:    General: Skin is warm.  Neurological:     Mental Status: She is alert and oriented to person, place, and time.  Psychiatric:        Mood and Affect: Affect normal.    LABORATORY DATA:  I have reviewed the data as listed    Component Value Date/Time   NA 140 07/29/2023 1422   K 4.2 07/29/2023 1422   K 4.1 07/01/2014 1429   CL 103 07/29/2023 1422   CO2 26 07/29/2023 1422   GLUCOSE 101 (H) 07/29/2023 1422   BUN 16 07/29/2023 1422   CREATININE 0.55 07/29/2023 1422   CALCIUM 9.2 07/29/2023 1422   PROT 6.8 07/29/2023 1422   ALBUMIN 4.1 07/29/2023 1422   AST 32 07/29/2023 1422   ALT 26 07/29/2023 1422   ALKPHOS 63 07/29/2023 1422   BILITOT 0.6 07/29/2023 1422   GFRNONAA >60 07/29/2023 1422   GFRAA >60 03/28/2020 1305    No results found for: "SPEP", "UPEP"  Lab Results  Component Value Date   WBC 4.2 07/29/2023   NEUTROABS 2.6 07/29/2023   HGB 13.0 07/29/2023   HCT 39.5 07/29/2023   MCV 98.8 07/29/2023   PLT 196 07/29/2023      Chemistry      Component Value Date/Time   NA 140 07/29/2023 1422   K 4.2 07/29/2023 1422   K 4.1 07/01/2014 1429   CL 103 07/29/2023 1422   CO2 26 07/29/2023 1422   BUN 16 07/29/2023 1422   CREATININE 0.55 07/29/2023 1422      Component Value Date/Time   CALCIUM  9.2 07/29/2023 1422   ALKPHOS 63 07/29/2023 1422   AST 32 07/29/2023 1422   ALT 26 07/29/2023 1422   BILITOT 0.6 07/29/2023 1422       RADIOGRAPHIC STUDIES: I have personally reviewed the radiological images as listed and agreed with the findings in the report. No results found.   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 PET DEC 2023- Stable PET-CT findings. No recurrent lymphoma is identified [ Deauville 1].  PET scan OCT 4th, 2024- NED.  Monitor clinical basis. Stable.   # Secondary immunodeficiency -multiple infections [UTI/resipiratory; COVID] s/p IVIG x3- JULY 2024-IgG-228  [trough goal: >400- 500].   Pre-meds: Continue Dex; Tylenol; benadryl 50 night pror.  Also recommend Tylenol; benadryl 50 night  x3 days after infusion.   # Recurrent bladder infection:- HOLD IVIG today- OCT 2024- IgG levels > 500; will recheck in 3 months.   # Skin rash post infusion-no evidence of any anaphylactic reaction.  Also recommend Tylenol; benadryl 50 night  x3 days after infusion- see above.   # Mild Leucopenia/ intremittent neutropenia/lymphopenia- anemia-Hb 10-11-likely secondary to CAR-T therapy; no concern for any progressive disease. MAY 2024--ferritin 14 saturation-15%-  poor tolerance to gentle iron [iron biglycinate; 28 mg ]-s/p IV Venofer.x2   #  Prophylactic antibiotics with Valtrex- until CD 4 count > 200]. Repeat  CD4- JULY 2024- 113-Refilled antibiotics.  prophylactic antibiotics- STOP Bactrim M/W/F [given resistant UTIs]; will refill valtrex 500 mg/day. Discussed re: shingles vaccine.   # Peripheral neuropathy/left foot drop-  stable  # ILD-  stable; defer to PCP   # Mild Hypocalcemia- MAY 2024 vit D levels-55- stable.   # Mediport-IV access-no blood return- s/p TPA-  # DISPOSITION: # cancel IVIG infusion tomorrow; NO venofer;  de-access # follow up in 3  month MD;possible venofer;  port/ 1 week PRIOR- labs- quantitative immunoglobulin; cbc/cmp; iron studies; ferritin;  D-2 Possible IVIG infusion-- Dr.B      No orders of the defined types were placed in this encounter.  All questions were answered. The patient knows to call the clinic with any problems, questions or concerns.      Earna Coder, MD 07/29/2023 3:39 PM

## 2023-07-29 NOTE — Progress Notes (Signed)
Pt denies fevers or night sweats. No swollen lymph nodes. Does have fatigue. Tales imodium for diarrhea with IBS. Occ achiness of joints. Mild cough and dyspnea. Does have some dizziness. No visible blood in stools.

## 2023-07-29 NOTE — Assessment & Plan Note (Addendum)
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 PET DEC 2023- Stable PET-CT findings. No recurrent lymphoma is identified [ Deauville 1].  PET scan OCT 4th, 2024- NED.  Monitor clinical basis. Stable.   # Secondary immunodeficiency -multiple infections [UTI/resipiratory; COVID] s/p IVIG x3- JULY 2024-IgG-228  [trough goal: >400- 500].   Pre-meds: Continue Dex; Tylenol; benadryl 50 night pror.  Also recommend Tylenol; benadryl 50 night  x3 days after infusion.   # Recurrent bladder infection:- HOLD IVIG today- OCT 2024- IgG levels > 500; will recheck in 3 months.   # Skin rash post infusion-no evidence of any anaphylactic reaction.  Also recommend Tylenol; benadryl 50 night  x3 days after infusion- see above.   # Mild Leucopenia/ intremittent neutropenia/lymphopenia- anemia-Hb 10-11-likely secondary to CAR-T therapy; no concern for any progressive disease. MAY 2024--ferritin 14 saturation-15%-  poor tolerance to gentle iron [iron biglycinate; 28 mg ]-s/p IV Venofer.x2   # Prophylactic antibiotics with Valtrex- until CD 4 count > 200]. Repeat  CD4- JULY 2024- 113-Refilled antibiotics.  prophylactic antibiotics- STOP Bactrim M/W/F [given resistant UTIs]; will refill valtrex 500 mg/day. Discussed re: shingles vaccine.   # Peripheral neuropathy/left foot drop-  stable  # ILD-  stable; defer to PCP   # Mild Hypocalcemia- MAY 2024 vit D levels-55- stable.   # Mediport-IV access-no blood return- s/p TPA-  # DISPOSITION: # cancel IVIG infusion tomorrow; NO venofer; de-access # follow up in 3  month MD;possible venofer;  port/ 1 week PRIOR- labs- quantitative immunoglobulin; cbc/cmp; iron studies; ferritin;  D-2 Possible IVIG infusion-- Dr.B

## 2023-07-29 NOTE — Addendum Note (Signed)
Addended by: Darrold Span A on: 07/29/2023 03:52 PM   Modules accepted: Orders

## 2023-07-30 ENCOUNTER — Inpatient Hospital Stay: Payer: Medicare PPO

## 2023-10-01 ENCOUNTER — Other Ambulatory Visit: Payer: Self-pay | Admitting: Internal Medicine

## 2023-10-02 ENCOUNTER — Encounter: Payer: Self-pay | Admitting: Internal Medicine

## 2023-10-21 ENCOUNTER — Inpatient Hospital Stay: Payer: Medicare PPO | Attending: Internal Medicine

## 2023-10-21 DIAGNOSIS — Z8572 Personal history of non-Hodgkin lymphomas: Secondary | ICD-10-CM | POA: Insufficient documentation

## 2023-10-21 DIAGNOSIS — Z452 Encounter for adjustment and management of vascular access device: Secondary | ICD-10-CM | POA: Diagnosis present

## 2023-10-21 DIAGNOSIS — Z7952 Long term (current) use of systemic steroids: Secondary | ICD-10-CM | POA: Diagnosis not present

## 2023-10-21 DIAGNOSIS — Z9481 Bone marrow transplant status: Secondary | ICD-10-CM | POA: Insufficient documentation

## 2023-10-21 DIAGNOSIS — Z9221 Personal history of antineoplastic chemotherapy: Secondary | ICD-10-CM | POA: Diagnosis not present

## 2023-10-21 DIAGNOSIS — Z79624 Long term (current) use of inhibitors of nucleotide synthesis: Secondary | ICD-10-CM | POA: Insufficient documentation

## 2023-10-21 DIAGNOSIS — Z79899 Other long term (current) drug therapy: Secondary | ICD-10-CM | POA: Insufficient documentation

## 2023-10-21 DIAGNOSIS — Z95828 Presence of other vascular implants and grafts: Secondary | ICD-10-CM

## 2023-10-21 DIAGNOSIS — C8333 Diffuse large B-cell lymphoma, intra-abdominal lymph nodes: Secondary | ICD-10-CM

## 2023-10-21 DIAGNOSIS — D849 Immunodeficiency, unspecified: Secondary | ICD-10-CM | POA: Diagnosis present

## 2023-10-21 DIAGNOSIS — Z87891 Personal history of nicotine dependence: Secondary | ICD-10-CM | POA: Insufficient documentation

## 2023-10-21 DIAGNOSIS — D649 Anemia, unspecified: Secondary | ICD-10-CM | POA: Insufficient documentation

## 2023-10-21 LAB — CMP (CANCER CENTER ONLY)
ALT: 11 U/L (ref 0–44)
AST: 15 U/L (ref 15–41)
Albumin: 3 g/dL — ABNORMAL LOW (ref 3.5–5.0)
Alkaline Phosphatase: 63 U/L (ref 38–126)
Anion gap: 9 (ref 5–15)
BUN: 8 mg/dL (ref 8–23)
CO2: 27 mmol/L (ref 22–32)
Calcium: 8.4 mg/dL — ABNORMAL LOW (ref 8.9–10.3)
Chloride: 102 mmol/L (ref 98–111)
Creatinine: 0.6 mg/dL (ref 0.44–1.00)
GFR, Estimated: >60 mL/min (ref >60)
Glucose, Bld: 121 mg/dL — ABNORMAL HIGH (ref 70–99)
Potassium: 2.9 mmol/L — ABNORMAL LOW (ref 3.5–5.1)
Sodium: 138 mmol/L (ref 135–145)
Total Bilirubin: 0.7 mg/dL (ref 0.0–1.2)
Total Protein: 5.7 g/dL — ABNORMAL LOW (ref 6.5–8.1)

## 2023-10-21 LAB — CBC (CANCER CENTER ONLY)
HCT: 34.1 % — ABNORMAL LOW (ref 36.0–46.0)
Hemoglobin: 11.5 g/dL — ABNORMAL LOW (ref 12.0–15.0)
MCH: 32.1 pg (ref 26.0–34.0)
MCHC: 33.7 g/dL (ref 30.0–36.0)
MCV: 95.3 fL (ref 80.0–100.0)
Platelet Count: 214 10*3/uL (ref 150–400)
RBC: 3.58 MIL/uL — ABNORMAL LOW (ref 3.87–5.11)
RDW: 13.5 % (ref 11.5–15.5)
WBC Count: 4.1 10*3/uL (ref 4.0–10.5)
nRBC: 0 % (ref 0.0–0.2)

## 2023-10-21 LAB — FERRITIN: Ferritin: 142 ng/mL (ref 11–307)

## 2023-10-21 LAB — IRON AND TIBC
Iron: 45 ug/dL (ref 28–170)
Saturation Ratios: 18 % (ref 10.4–31.8)
TIBC: 248 ug/dL — ABNORMAL LOW (ref 250–450)
UIBC: 203 ug/dL

## 2023-10-21 MED ORDER — SODIUM CHLORIDE 0.9% FLUSH
10.0000 mL | Freq: Once | INTRAVENOUS | Status: AC
Start: 2023-10-21 — End: 2023-10-21
  Administered 2023-10-21: 10 mL via INTRAVENOUS
  Filled 2023-10-21: qty 10

## 2023-10-21 MED ORDER — HEPARIN SOD (PORK) LOCK FLUSH 100 UNIT/ML IV SOLN
500.0000 [IU] | Freq: Once | INTRAVENOUS | Status: AC
  Administered 2023-10-21: 500 [IU] via INTRAVENOUS
  Filled 2023-10-21: qty 5

## 2023-10-25 LAB — IMMUNOGLOBULINS A/E/G/M, SERUM
IgA: 18 mg/dL — ABNORMAL LOW (ref 64–422)
IgE (Immunoglobulin E), Serum: <2 [IU]/mL — ABNORMAL LOW (ref 6–495)
IgG (Immunoglobin G), Serum: 200 mg/dL — ABNORMAL LOW (ref 586–1602)
IgM (Immunoglobulin M), Srm: <5 mg/dL — ABNORMAL LOW (ref 26–217)

## 2023-10-28 ENCOUNTER — Inpatient Hospital Stay: Payer: Medicare PPO

## 2023-10-28 ENCOUNTER — Encounter: Payer: Self-pay | Admitting: Internal Medicine

## 2023-10-28 ENCOUNTER — Inpatient Hospital Stay: Payer: Medicare PPO | Admitting: Internal Medicine

## 2023-10-28 VITALS — BP 132/56 | HR 90 | Temp 99.4°F | Resp 24 | Ht 67.0 in | Wt 130.8 lb

## 2023-10-28 VITALS — BP 127/56 | HR 86 | Resp 16

## 2023-10-28 DIAGNOSIS — R300 Dysuria: Secondary | ICD-10-CM

## 2023-10-28 DIAGNOSIS — C8333 Diffuse large B-cell lymphoma, intra-abdominal lymph nodes: Secondary | ICD-10-CM

## 2023-10-28 DIAGNOSIS — Z452 Encounter for adjustment and management of vascular access device: Secondary | ICD-10-CM | POA: Diagnosis not present

## 2023-10-28 DIAGNOSIS — E860 Dehydration: Secondary | ICD-10-CM

## 2023-10-28 LAB — URINALYSIS, COMPLETE (UACMP) WITH MICROSCOPIC
Bilirubin Urine: NEGATIVE
Glucose, UA: NEGATIVE mg/dL
Ketones, ur: NEGATIVE mg/dL
Nitrite: POSITIVE — AB
Protein, ur: 100 mg/dL — AB
RBC / HPF: >50 RBC/hpf (ref 0–5)
Specific Gravity, Urine: 1.018 (ref 1.005–1.030)
WBC, UA: >50 WBC/hpf (ref 0–5)
pH: 6 (ref 5.0–8.0)

## 2023-10-28 MED ORDER — IRON SUCROSE 20 MG/ML IV SOLN
200.0000 mg | Freq: Once | INTRAVENOUS | Status: AC
  Administered 2023-10-28: 200 mg via INTRAVENOUS
  Filled 2023-10-28: qty 10

## 2023-10-28 MED ORDER — POTASSIUM CHLORIDE CRYS ER 20 MEQ PO TBCR: EXTENDED_RELEASE_TABLET | ORAL | 2 refills | Status: DC

## 2023-10-28 MED ORDER — HEPARIN SOD (PORK) LOCK FLUSH 100 UNIT/ML IV SOLN
500.0000 [IU] | Freq: Once | INTRAVENOUS | Status: AC | PRN
  Administered 2023-10-28: 500 [IU]
  Filled 2023-10-28: qty 5

## 2023-10-28 NOTE — Progress Notes (Signed)
 Fell yesterday, only bruises.   She thought she was on a rx for anxiety but can't remember name.  No Appetite, ensure 1-2 per day. Having some nausea.  Cardiac scoring 06/24/23.  Burning with urination x2 days.

## 2023-10-28 NOTE — Patient Instructions (Signed)

## 2023-10-28 NOTE — Assessment & Plan Note (Addendum)
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 PET DEC 2023- Stable PET-CT findings. No recurrent lymphoma is identified [ Deauville 1].  PET scan OCT 4th, 2024- NED.  Monitor clinical basis. Stable.   # Secondary immunodeficiency -multiple infections [UTI/resipiratory; COVID] s/p IVIG x3- JULY 2024-IgG-228  [trough goal: >400- 500].   Pre-meds: Continue Dex; Tylenol; benadryl 50 night pror.  Also recommend Tylenol; benadryl 50 night  x3 days after infusion.   # Recurrent bladder infection:- secondary to Immunodef- rule out other causes.  Proceed with IVIG- will refer to Urology re: recurrent UTIs. Check UA/culture today.    # Skin rash post infusion-no evidence of any anaphylactic reaction.  Also recommend Tylenol; benadryl 50 night  x3 days after infusion- see above.   # Mild Leucopenia/ intremittent neutropenia/lymphopenia- anemia-Hb 10-11-likely secondary to CAR-T therapy; no concern for any progressive disease. MAY 2024--ferritin 14 saturation-15%-  poor tolerance to gentle iron [iron biglycinate; 28 mg ]-s/p IV Venofer- proceed with venofer.   # Prophylactic antibiotics with Valtrex- until CD 4 count > 200]. Repeat  CD4- JULY 2024- 113-Refilled antibiotics.  prophylactic antibiotics- STOP Bactrim M/W/F [given resistant UTIs]; will refill valtrex 500 mg/day. Discussed re: shingles vaccine.   # Peripheral neuropathy/left foot drop-  stable  # ILD-  defer to PCP -  stable  # Mild Hypocalcemia- MAY 2024 vit D levels-55- stable.   # Mediport-IV access-no blood return- s/p TPA-  # DISPOSITION: # UA and culture today # venofer today # IVIG tomorrow as planned # IVIG monthly x 2 more. # follow up in 3  month MD;possible venofer;  port/ 1 week PRIOR- labs- quantitative immunoglobulin; cbc/cmp; iron studies; ferritin;  D-2 Possible IVIG infusion-- Dr.B

## 2023-10-28 NOTE — Progress Notes (Signed)
 [facility] OFFICE PROGRESS NOTE  Patient Care Team: Danella Penton, [name] Earna Coder, MD as Consulting Physician (Hematology and Oncology) Stanton Kidney, MD as Consulting Physician (Gastroenterology)   Cancer Staging  No matching staging information was found for the patient.   Oncology History Overview Note  DEC 2017- DLBCL "TRIPLE HIT [myc/ bcl-2/bcl-6 gene rearrangement FISH]" ~10 cm mass RP LN; STAGE II [jan 2018- BMBx-NEG]; Jan 8th R-[facility]OP;   # JAN 29th 2018- DA-R EPOCH x5 ; PET CR; s/p RT [last 03/18/2017]  # FEB-MARCH 2019- RECURRENCE of DLBCL [s/p RP Bx; 4 cm mass inferior to Left Kidney] ; II [facility]; Dr.Grover.   # April 3rd 2019- R-GDP [carbo]; MAY 2019- PET PR; NOV 2019- PR  #August 2020-recurrence bulky left pelvis/retroperitoneal [9-10 cm]; no biopsy. #August 24th-Rituxan weekly cycle #1; prednisone 60 mg a day.   06/03/2019 - Chemotherapy  BMT OP CAR-T LYMPHODEPLETION FLU/CY + IP AXICABTAGENE CILOLEUCEL (YESCARTA) Fludarabine 30 mg/m2 IV Days 1, 2, 3 Cyclophosphamide 500 mg/m2 IV Days 1, 2, 3 Axicabtagene ciloleucel target dose 2 x 10^6 CAR-positive viable T cells per kg body weight (maximum of 2 x 10^8 CAR-positive viable T cells) on Day 6.  # SEP 2020- 42month PET-[facility]- significant PR.   #August 2020-2D echo ejection fraction 60 to 65%  --------------------------------  # JAN 26th-LP  # Interstitial Lung disease [surveillance] --------------------------------------------------------------------------  DIAGNOSIS: [MARCH 2019 ] RECURRENT DLBCL  STAGE:   Recurrent ; GOALS: CURATIVE  CURRENT/MOST RECENT THERAPY: surveillaince   Diffuse large B-cell lymphoma of intra-abdominal lymph nodes (HCC)  04/13/2019 - 04/13/2019 Chemotherapy   The patient had riTUXimab-pvvr (RUXIENCE) 700 mg in sodium chloride 0.9 % 250 mL chemo infusion, , Intravenous, Once, 1 of 4 cycles Administration:  (04/13/2019)   for chemotherapy treatment.    05/07/2019 - 05/07/2019 Chemotherapy   The patient had riTUXimab-hyaluronidase human (RITUXAN HYCELA) 1400-23400 MG -UT/11.7ML injection SQ 1,400 mg, 1,400 mg, Subcutaneous,  Once, 1 of 4 cycles Administration: 1,400 mg (05/07/2019)  for chemotherapy treatment.     INTERVAL HISTORY: Patient is with husband. She ambulating independently.  Dahlia Client 78 y.o.  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  [place] yesterday [place] church parking lot, only bruises. No falls.    Burning with urination x2 days. Pt denies fevers or night sweats. No swollen lymph nodes. Does have fatigue.  No night sweats. Takes imodium for diarrhea with IBS. No visible blood in stool.   Has SOB with exertion.  Patient denied any pneumonias.  No hospitalizations. Neuropathy is stable on the Gabapentin.  Review of Systems  Constitutional:  Positive for malaise/fatigue. Negative for chills, diaphoresis, fever and weight loss.  HENT:  Negative for nosebleeds and sore throat.   Eyes:  Negative for double vision.  Respiratory:  Positive for shortness of breath. Negative for cough, hemoptysis, sputum production and wheezing.   Cardiovascular:  Negative for chest pain, palpitations, orthopnea and leg swelling.  Gastrointestinal:  Negative for abdominal pain, blood in stool, constipation, diarrhea, melena and vomiting.  Genitourinary:  Negative for dysuria, frequency and urgency.  Musculoskeletal:  Positive for joint pain.  Skin: Negative.  Negative for itching and rash.  Neurological:  Positive for tingling. Negative for focal weakness and weakness.  Endo/Heme/Allergies:  Does not bruise/bleed easily.  Psychiatric/Behavioral:  Negative for depression. The patient is not nervous/anxious and does  not have insomnia.     PAST MEDICAL HISTORY :  Past Medical History:  Diagnosis Date   Diabetes mellitus without complication (HCC)    Diffuse large B-cell lymphoma (HCC)    Chemo + rad tx's   Dysplastic nevus 03/14/2020   left inf med scapula moderate atypia    Dysplastic nevus 03/14/2020   left lat breast parallel to areaola, moderate atypia    Heart murmur    History of chemotherapy 07/08/2017   History of gastric ulcer    History of radiation therapy 07/08/2017   Hypercholesterolemia    Hypertension    ILD (interstitial lung disease) (HCC)    8 yrs ago   Lymphadenopathy     PAST SURGICAL HISTORY :   Past Surgical History:  Procedure Laterality Date   BREAST BIOPSY Left 2010   neg   CATARACT EXTRACTION W/PHACO Left 10/16/2017   Procedure: CATARACT EXTRACTION PHACO AND INTRAOCULAR LENS PLACEMENT (IOC) COMPLICATED DIABETIC LEFT;  Surgeon: Lockie Mola, MD;  Location[facility];  Service: Ophthalmology;  Laterality: Left;  IRIS HOOKS Diabetic - oral meds   COLONOSCOPY N/A 06/19/2021   Procedure: COLONOSCOPY;  Surgeon: Jaynie Collins, DO;  Location: [facility];  Service: Gastroenterology;  Laterality: N/A;   ESOPHAGOGASTRODUODENOSCOPY N/A 06/19/2021   Procedure: ESOPHAGOGASTRODUODENOSCOPY (EGD);  Surgeon: Jaynie Collins, DO;  Location: [facility] ENDOSCOPY;  Service: Gastroenterology;  Laterality: N/A;   HERNIA REPAIR     PARTIAL HYSTERECTOMY     age 54   PERIPHERAL VASCULAR CATHETERIZATION N/A 08/22/2016   Procedure: Shelda Pal Cath Insertion;  Surgeon: Renford Dills, MD;  Location: [facility] INVASIVE CV LAB;  Service: Cardiovascular;  Laterality: N/A;   TONSILLECTOMY      FAMILY HISTORY :   Family History  Problem Relation Age of Onset   Heart disease Mother    Heart disease Father    Heart disease Brother     SOCIAL HISTORY:   Social History   Tobacco Use   Smoking status: Former    Current packs/day: 0.00    Types: Cigarettes    Quit  date: 08/21/1958    Years since quitting: 65.2   Smokeless tobacco: Never  Vaping Use   Vaping status: Never Used  Substance Use Topics   Alcohol use: No   Drug use: No    ALLERGIES:  is allergic to levaquin [levofloxacin in d5w], cefuroxime axetil, and doxycycline.  MEDICATIONS:  Current Outpatient Medications  Medication Sig Dispense Refill   acetaminophen (TYLENOL) 325 MG tablet Take by mouth.     b complex vitamins tablet Take 1 tablet by mouth daily.     buPROPion (WELLBUTRIN XL) 150 MG 24 hr tablet Take 150 mg by mouth at bedtime.     dexamethasone (DECADRON) 4 MG tablet Take 1 tablet (4 mg total) by mouth 2 (two) times daily. For 1 day; prior to infusion. 30 tablet 0   gabapentin (NEURONTIN) 300 MG capsule Take 1 capsule (300 mg total) by mouth 2 (two) times daily. (Patient taking differently: Take 300 mg by mouth daily.) 60 capsule 3   ibuprofen (ADVIL) 200 MG tablet Take by mouth.     latanoprost (XALATAN) 0.005 % ophthalmic solution Place 1 drop into both eyes at bedtime.      metoprolol tartrate (LOPRESSOR) 25 MG tablet TAKE 1 TABLET BY MOUTH TWICE DAILY (Patient taking differently: 12.5 mg 2 (two) times daily. (BETA BLOCKER)) 180 tablet  0   montelukast (SINGULAIR) 10 MG tablet TAKE 1 TABLET BY MOUTH AT BEDTIME 90 tablet 0   ondansetron (ZOFRAN) 8 MG tablet Take by mouth.     pantoprazole (PROTONIX) 40 MG tablet Take 1 tablet by mouth in the morning and at bedtime.     potassium chloride SA (KLOR-CON M) 20 MEQ tablet 1 pill twice a day x 1 week; and then one a day. 60 tablet 2   prochlorperazine (COMPAZINE) 10 MG tablet Take 10 mg by mouth every 6 (six) hours as needed for nausea or vomiting.     rosuvastatin (CRESTOR) 10 MG tablet Take 1 tablet by mouth daily.     valACYclovir (VALTREX) 500 MG tablet Take 1 tablet (500 mg total) by mouth daily. 90 tablet 1   No current facility-administered medications for this visit.   Facility-Administered Medications Ordered in Other  Visits  Medication Dose Route Frequency Provider Last Rate Last Admin   0.9 %  sodium chloride infusion   Intravenous Continuous Earna Coder, MD 10 mL/hr at 06/19/21 1230 Continued from Pre-op at 06/19/21 1230   0.9 %  sodium chloride infusion   Intravenous Continuous Earna Coder, MD   Stopped at 12/26/16 1011   0.9 %  sodium chloride infusion   Intravenous Continuous Louretta Shorten R, MD 10 mL/hr at 12/27/16 0930 New Bag at 12/27/16 0930   heparin lock flush 100 unit/mL  500 Units Intravenous Once Louretta Shorten R, MD       heparin lock flush 100 unit/mL  500 Units Intravenous Once Louretta Shorten R, MD       sodium chloride flush (NS) 0.9 % injection 10 mL  10 mL Intravenous PRN Louretta Shorten R, MD   10 mL at 11/06/16 1000   sodium chloride flush (NS) 0.9 % injection 10 mL  10 mL Intravenous PRN Earna Coder, MD   10 mL at 11/07/16 0905   sodium chloride flush (NS) 0.9 % injection 10 mL  10 mL Intravenous PRN Earna Coder, MD       sodium chloride flush (NS) 0.9 % injection 10 mL  10 mL Intravenous PRN Louretta Shorten R, MD   10 mL at 12/05/16 1030   sodium chloride flush (NS) 0.9 % injection 10 mL  10 mL Intravenous PRN Louretta Shorten R, MD   10 mL at 12/27/16 0930   sodium chloride flush (NS) 0.9 % injection 10 mL  10 mL Intravenous PRN Earna Coder, MD   10 mL at 09/27/20 1305    PHYSICAL EXAMINATION: ECOG PERFORMANCE STATUS: 1 - Symptomatic but completely ambulatory  BP (!) 132/56 (BP Location: Left Arm, Patient Position: Sitting, Cuff Size: Normal)   Pulse 90   Temp 99.4 F (37.4 C)   Resp (!) 24   Ht 5\' 7"  (1.702 m)   Wt 130 lb 12.8 oz (59.3 kg)   SpO2 100%   BMI 20.49 kg/m   Filed Weights   10/28/23 1418  Weight: 130 lb 12.8 oz (59.3 kg)      Tenderness left maxillary sinus area  Physical Exam HENT:     Head: Normocephalic and atraumatic.     Mouth/Throat:     Pharynx: No oropharyngeal  exudate.  Eyes:     Pupils: Pupils are equal, round, and reactive to light.  Cardiovascular:     Rate and Rhythm: Normal rate and regular rhythm.  Pulmonary:     Effort: No respiratory distress.  Breath sounds: No wheezing.     Comments: Bilateral crackles at the bases.  No wheeze. Abdominal:     General: Bowel sounds are normal. There is no distension.     Palpations: Abdomen is soft. There is no mass.     Tenderness: There is no abdominal tenderness. There is no guarding or rebound.  Musculoskeletal:        General: No tenderness. Normal range of motion.     Cervical back: Normal range of motion and neck supple.  Skin:    General: Skin is warm.  Neurological:     Mental Status: She is alert and oriented to person, place, and time.  Psychiatric:        Mood and Affect: Affect normal.    LABORATORY DATA:  I have reviewed the data as listed    Component Value Date/Time   NA 138 10/21/2023 1402   K 2.9 (L) 10/21/2023 1402   K 4.1 07/01/2014 1429   CL 102 10/21/2023 1402   CO2 27 10/21/2023 1402   GLUCOSE 121 (H) 10/21/2023 1402   BUN 8 10/21/2023 1402   CREATININE 0.60 10/21/2023 1402   CALCIUM 8.4 (L) 10/21/2023 1402   PROT 5.7 (L) 10/21/2023 1402   ALBUMIN 3.0 (L) 10/21/2023 1402   AST 15 10/21/2023 1402   ALT 11 10/21/2023 1402   ALKPHOS 63 10/21/2023 1402   BILITOT 0.7 10/21/2023 1402   GFRNONAA >60 10/21/2023 1402   GFRAA >60 03/28/2020 1305    No results found for: "SPEP", "UPEP"  Lab Results  Component Value Date   WBC 4.1 10/21/2023   NEUTROABS 2.6 07/29/2023   HGB 11.5 (L) 10/21/2023   HCT 34.1 (L) 10/21/2023   MCV 95.3 10/21/2023   PLT 214 10/21/2023      Chemistry      Component Value Date/Time   NA 138 10/21/2023 1402   K 2.9 (L) 10/21/2023 1402   K 4.1 07/01/2014 1429   CL 102 10/21/2023 1402   CO2 27 10/21/2023 1402   BUN 8 10/21/2023 1402   CREATININE 0.60 10/21/2023 1402      Component Value Date/Time   CALCIUM 8.4 (L) 10/21/2023  1402   ALKPHOS 63 10/21/2023 1402   AST 15 10/21/2023 1402   ALT 11 10/21/2023 1402   BILITOT 0.7 10/21/2023 1402       RADIOGRAPHIC STUDIES: I have personally reviewed the radiological images as listed and agreed with the findings in the report. No results found.   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 PET DEC 2023- Stable PET-CT findings. No recurrent lymphoma is identified [ [place] 1].  PET scan OCT 4th, 2024- NED.  Monitor clinical basis. Stable.   # Secondary immunodeficiency -multiple infections [UTI/resipiratory; COVID] s/p IVIG x3- JULY 2024-IgG-228  [trough goal: >400- 500].   Pre-meds: Continue Dex; Tylenol; benadryl 50 night pror.  Also recommend Tylenol; benadryl 50 night  x3 days after infusion.   # Recurrent bladder infection:- secondary to Immunodef- rule out other causes.  Proceed with IVIG- will refer to Urology re: recurrent UTIs. Check UA/culture today.    # Skin rash post infusion-no evidence of any anaphylactic reaction.  Also recommend Tylenol; benadryl 50 night  x3 days after infusion- see above.   # Mild Leucopenia/ intremittent neutropenia/lymphopenia- anemia-Hb 10-11-likely secondary to CAR-T therapy; no concern for any progressive disease.  MAY 2024--ferritin 14 saturation-15%-  poor tolerance to gentle iron [iron biglycinate; 28 mg ]-s/p IV Venofer- proceed with venofer.   # Prophylactic antibiotics with Valtrex- until CD 4 count > 200]. Repeat  CD4- JULY 2024- 113-Refilled antibiotics.  prophylactic antibiotics- STOP Bactrim M/W/F [given resistant UTIs]; will refill valtrex 500 mg/day. Discussed re: shingles vaccine.   # Peripheral neuropathy/left foot drop-  stable  # ILD-  defer to PCP -  stable  # Mild Hypocalcemia- MAY 2024 vit D levels-55- stable.   # Mediport-IV access-no blood return- s/p  TPA-  # DISPOSITION: # UA and culture today # venofer today # IVIG tomorrow as planned # IVIG monthly x 2 more. # follow up in 3  month MD;possible venofer;  port/ 1 week PRIOR- labs- quantitative immunoglobulin; cbc/cmp; iron studies; ferritin;  D-2 Possible IVIG infusion-- Dr.B      Orders Placed This Encounter  Procedures   Urine Culture    Standing Status:   Future    Number of Occurrences:   1    Expected Date:   10/28/2023    Expiration Date:   10/27/2024   Urinalysis, Complete w Microscopic    Standing Status:   Future    Number of Occurrences:   1    Expected Date:   10/28/2023    Expiration Date:   10/27/2024   Immunoglobulins, QN, A/E/G/M    Standing Status:   Future    Expected Date:   01/28/2024    Expiration Date:   10/27/2024   Iron and TIBC    Standing Status:   Future    Expected Date:   01/28/2024    Expiration Date:   10/27/2024   Ferritin    Standing Status:   Future    Expected Date:   01/28/2024    Expiration Date:   10/27/2024   CBC with Differential (Cancer Center Only)    Standing Status:   Future    Expected Date:   01/28/2024    Expiration Date:   10/27/2024   CMP (Cancer Center only)    Standing Status:   Future    Expected Date:   01/28/2024    Expiration Date:   10/27/2024   Ambulatory referral to Urology    Referral Priority:   Routine    Referral Type:   Consultation    Referral Reason:   Specialty Services Required    Requested Specialty:   Urology    Number of Visits Requested:   1   All questions were answered. The patient knows to call the clinic with any problems, questions or concerns.      Earna Coder, MD 10/28/2023 3:39 PM

## 2023-10-29 ENCOUNTER — Inpatient Hospital Stay: Payer: Medicare PPO

## 2023-10-29 VITALS — BP 113/54 | HR 81 | Temp 99.1°F | Resp 16

## 2023-10-29 DIAGNOSIS — Z452 Encounter for adjustment and management of vascular access device: Secondary | ICD-10-CM | POA: Diagnosis not present

## 2023-10-29 DIAGNOSIS — E860 Dehydration: Secondary | ICD-10-CM

## 2023-10-29 DIAGNOSIS — C8333 Diffuse large B-cell lymphoma, intra-abdominal lymph nodes: Secondary | ICD-10-CM

## 2023-10-29 MED ORDER — DEXTROSE 5 % IV SOLN
Freq: Once | INTRAVENOUS | Status: AC
  Filled 2023-10-29: qty 250

## 2023-10-29 MED ORDER — DIPHENHYDRAMINE HCL 25 MG PO CAPS
50.0000 mg | ORAL_CAPSULE | Freq: Once | ORAL | Status: AC
Start: 2023-10-29 — End: 2023-10-29
  Administered 2023-10-29: 50 mg via ORAL
  Filled 2023-10-29: qty 2

## 2023-10-29 MED ORDER — HEPARIN SOD (PORK) LOCK FLUSH 100 UNIT/ML IV SOLN
500.0000 [IU] | Freq: Once | INTRAVENOUS | Status: DC | PRN
  Filled 2023-10-29: qty 5

## 2023-10-29 MED ORDER — ACETAMINOPHEN 325 MG PO TABS
650.0000 mg | ORAL_TABLET | Freq: Once | ORAL | Status: AC
  Administered 2023-10-29: 650 mg via ORAL
  Filled 2023-10-29: qty 2

## 2023-10-29 MED ORDER — MONTELUKAST SODIUM 10 MG PO TABS
10.0000 mg | ORAL_TABLET | Freq: Once | ORAL | Status: AC
  Administered 2023-10-29: 10 mg via ORAL
  Filled 2023-10-29: qty 1

## 2023-10-29 MED ORDER — IMMUNE GLOBULIN (HUMAN) 10 GM/100ML IV SOLN
400.0000 mg/kg | Freq: Once | INTRAVENOUS | Status: AC
  Administered 2023-10-29: 25 g via INTRAVENOUS
  Filled 2023-10-29: qty 50

## 2023-10-29 MED ORDER — DEXAMETHASONE SODIUM PHOSPHATE 10 MG/ML IJ SOLN
10.0000 mg | Freq: Once | INTRAMUSCULAR | Status: AC
  Administered 2023-10-29: 10 mg via INTRAVENOUS
  Filled 2023-10-29: qty 1

## 2023-10-30 ENCOUNTER — Telehealth: Payer: Self-pay | Admitting: Internal Medicine

## 2023-10-30 MED ORDER — CIPROFLOXACIN HCL 500 MG PO TABS: 500.0000 mg | ORAL_TABLET | Freq: Two times a day (BID) | ORAL | 0 refills | Status: DC

## 2023-10-30 NOTE — Telephone Encounter (Signed)
 Because of bladder infection -please inform patient that I have called in a prescription for ciprofloxacin 5 mg twice daily for 7 days-start ASAP  Thanks GB

## 2023-10-31 LAB — URINE CULTURE: Culture: >=100000 — AB

## 2023-11-06 ENCOUNTER — Other Ambulatory Visit: Payer: Self-pay | Admitting: Internal Medicine

## 2023-11-18 ENCOUNTER — Inpatient Hospital Stay (HOSPITAL_BASED_OUTPATIENT_CLINIC_OR_DEPARTMENT_OTHER): Admitting: Hospice and Palliative Medicine

## 2023-11-18 ENCOUNTER — Other Ambulatory Visit: Payer: Self-pay

## 2023-11-18 ENCOUNTER — Inpatient Hospital Stay

## 2023-11-18 ENCOUNTER — Ambulatory Visit
Admission: RE | Admit: 2023-11-18 | Discharge: 2023-11-18 | Disposition: A | Discharge status: Home / Self Care | Source: Ambulatory Visit | Attending: Hospice and Palliative Medicine | Admitting: Hospice and Palliative Medicine

## 2023-11-18 ENCOUNTER — Telehealth: Payer: Self-pay | Admitting: *Deleted

## 2023-11-18 ENCOUNTER — Ambulatory Visit

## 2023-11-18 VITALS — BP 119/78 | HR 106 | Temp 99.4°F | Resp 16 | Wt 121.6 lb

## 2023-11-18 DIAGNOSIS — C8333 Diffuse large B-cell lymphoma, intra-abdominal lymph nodes: Secondary | ICD-10-CM

## 2023-11-18 DIAGNOSIS — R059 Cough, unspecified: Secondary | ICD-10-CM | POA: Insufficient documentation

## 2023-11-18 DIAGNOSIS — R350 Frequency of micturition: Secondary | ICD-10-CM | POA: Diagnosis not present

## 2023-11-18 DIAGNOSIS — R300 Dysuria: Secondary | ICD-10-CM

## 2023-11-18 DIAGNOSIS — N390 Urinary tract infection, site not specified: Secondary | ICD-10-CM

## 2023-11-18 DIAGNOSIS — R600 Localized edema: Secondary | ICD-10-CM | POA: Insufficient documentation

## 2023-11-18 DIAGNOSIS — Z452 Encounter for adjustment and management of vascular access device: Secondary | ICD-10-CM | POA: Diagnosis not present

## 2023-11-18 LAB — CBC WITH DIFFERENTIAL/PLATELET
Abs Immature Granulocytes: 0.26 10*3/uL — ABNORMAL HIGH (ref 0.00–0.07)
Basophils Absolute: 0 10*3/uL (ref 0.0–0.1)
Basophils Relative: 1 %
Eosinophils Absolute: 0.2 10*3/uL (ref 0.0–0.5)
Eosinophils Relative: 7 %
HCT: 35.4 % — ABNORMAL LOW (ref 36.0–46.0)
Hemoglobin: 11.7 g/dL — ABNORMAL LOW (ref 12.0–15.0)
Immature Granulocytes: 9 %
Lymphocytes Relative: 8 %
Lymphs Abs: 0.2 10*3/uL — ABNORMAL LOW (ref 0.7–4.0)
MCH: 30.5 pg (ref 26.0–34.0)
MCHC: 33.1 g/dL (ref 30.0–36.0)
MCV: 92.2 fL (ref 80.0–100.0)
Monocytes Absolute: 0.4 10*3/uL (ref 0.1–1.0)
Monocytes Relative: 16 %
Neutro Abs: 1.7 10*3/uL (ref 1.7–7.7)
Neutrophils Relative %: 59 %
Platelets: 213 10*3/uL (ref 150–400)
RBC: 3.84 MIL/uL — ABNORMAL LOW (ref 3.87–5.11)
RDW: 13.7 % (ref 11.5–15.5)
Smear Review: NORMAL
WBC: 2.8 10*3/uL — ABNORMAL LOW (ref 4.0–10.5)
nRBC: 0 % (ref 0.0–0.2)

## 2023-11-18 LAB — COMPREHENSIVE METABOLIC PANEL WITH GFR
ALT: 12 U/L (ref 0–44)
AST: 20 U/L (ref 15–41)
Albumin: 2.9 g/dL — ABNORMAL LOW (ref 3.5–5.0)
Alkaline Phosphatase: 66 U/L (ref 38–126)
Anion gap: 12 (ref 5–15)
BUN: 8 mg/dL (ref 8–23)
CO2: 21 mmol/L — ABNORMAL LOW (ref 22–32)
Calcium: 8.4 mg/dL — ABNORMAL LOW (ref 8.9–10.3)
Chloride: 101 mmol/L (ref 98–111)
Creatinine, Ser: 0.53 mg/dL (ref 0.44–1.00)
GFR, Estimated: >60 mL/min (ref >60)
Glucose, Bld: 151 mg/dL — ABNORMAL HIGH (ref 70–99)
Potassium: 3.3 mmol/L — ABNORMAL LOW (ref 3.5–5.1)
Sodium: 134 mmol/L — ABNORMAL LOW (ref 135–145)
Total Bilirubin: 0.7 mg/dL (ref 0.0–1.2)
Total Protein: 6.3 g/dL — ABNORMAL LOW (ref 6.5–8.1)

## 2023-11-18 MED ORDER — HEPARIN SOD (PORK) LOCK FLUSH 100 UNIT/ML IV SOLN
500.0000 [IU] | Freq: Once | INTRAVENOUS | Status: AC
  Administered 2023-11-18: 500 [IU] via INTRAVENOUS
  Filled 2023-11-18: qty 5

## 2023-11-18 MED ORDER — CIPROFLOXACIN HCL 500 MG PO TABS: 500.0000 mg | ORAL_TABLET | Freq: Two times a day (BID) | ORAL | 0 refills | Status: DC

## 2023-11-18 MED ORDER — FLUCONAZOLE 100 MG PO TABS: 100.0000 mg | ORAL_TABLET | Freq: Every day | ORAL | 0 refills | Status: DC

## 2023-11-18 NOTE — Telephone Encounter (Signed)
 Per the husband he feels that she started going down since 1 week before she got her last treatment. He wants MD to call him,

## 2023-11-18 NOTE — Progress Notes (Signed)
 Patient concern with decreasing appetite with 9 lb wt loss in last 20 days with increasing nausea no use of prescribed medication, needing refill.  Does take Megace and every day and Reglan bid for 2 weeks with no improvement of appetite.  Increasing weakness making it more difficult to ambulate.  Right ankle swelling with redness and painful spots since fall about 3 weeks ago    Recent UTI and finished abx about 10 days ago.

## 2023-11-18 NOTE — Telephone Encounter (Signed)
 Spoke to [place] and she needs cbc/d and met c and pt. Will come at 11 am for labs and then see Josh to see what they can do for the pt. Christianne Borrow called the husband to tell him 11 am for labs and then see NP

## 2023-11-18 NOTE — Progress Notes (Signed)
 Symptom Management Clinic [facility] at [facility] [place] [phone] [phone]  Patient Care Team: Danella Penton, [name] Earna Coder, MD as Consulting Physician (Hematology and Oncology) Stanton Kidney, MD as Consulting Physician (Gastroenterology)   NAME OF PATIENT: Rose Hill  [id number]  November 30, 1945   DATE OF VISIT: 11/18/23  REASON FOR CONSULT: Rose Hill is a 78 y.o. 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    Patient recently completed course of Cipro 500 mg twice daily x 7 days.  Has subsequently had return of dysuria and urinary frequency.  Also has occasional cough and respiratory congestion.  Denies fever or chills.  Reports poor oral intake and continued weight loss.  Has occasional nausea but no vomiting, constipation, or diarrhea  Patient had a fall several weeks ago and has subsequently had right lower extremity edema.   Patient offers no further specific complaints today.   PAST MEDICAL HISTORY: Past Medical History:  Diagnosis Date   Diabetes mellitus without complication (HCC)    Diffuse large B-cell lymphoma (HCC)    Chemo + rad tx's   Dysplastic nevus 03/14/2020   left inf med scapula moderate atypia    Dysplastic nevus 03/14/2020   left lat breast parallel to areaola, moderate atypia    Heart murmur    History of chemotherapy 07/08/2017   History of gastric ulcer    History of radiation therapy 07/08/2017   Hypercholesterolemia    Hypertension    ILD (interstitial lung disease) (HCC)    8 yrs ago   Lymphadenopathy     PAST SURGICAL HISTORY:  Past Surgical History:  Procedure Laterality Date   BREAST BIOPSY Left 2010   neg   CATARACT EXTRACTION  W/PHACO Left 10/16/2017   Procedure: CATARACT EXTRACTION PHACO AND INTRAOCULAR LENS PLACEMENT (IOC) COMPLICATED DIABETIC LEFT;  Surgeon: Lockie Mola, MD;  Location: [facility];  Service: Ophthalmology;  Laterality: Left;  IRIS HOOKS Diabetic - oral meds   COLONOSCOPY N/A 06/19/2021   Procedure: COLONOSCOPY;  Surgeon: Jaynie Collins, DO;  Location: [facility] ENDOSCOPY;  Service: Gastroenterology;  Laterality: N/A;   ESOPHAGOGASTRODUODENOSCOPY N/A 06/19/2021   Procedure: ESOPHAGOGASTRODUODENOSCOPY (EGD);  Surgeon: Jaynie Collins, DO;  Location: [facility] ENDOSCOPY;  Service: Gastroenterology;  Laterality: N/A;   HERNIA REPAIR     PARTIAL HYSTERECTOMY     age 86   PERIPHERAL VASCULAR CATHETERIZATION N/A 08/22/2016   Procedure: Shelda Pal Cath Insertion;  Surgeon: Renford Dills, MD;  Location: [facility] INVASIVE CV LAB;  Service: Cardiovascular;  Laterality: N/A;   TONSILLECTOMY      HEMATOLOGY/ONCOLOGY HISTORY:  Oncology History Overview Note  DEC 2017- DLBCL "TRIPLE HIT [myc/ bcl-2/bcl-6 gene rearrangement FISH]" ~10 cm mass RP LN; STAGE II [jan 2018- BMBx-NEG]; Jan 8th R-[facility]OP;   # JAN 29th 2018- DA-R EPOCH x5 ; PET CR; s/p RT [last 03/18/2017]  # FEB-MARCH 2019- RECURRENCE of DLBCL [s/p RP Bx; 4 cm mass inferior to Left Kidney] ; II OPINION [facility]; Dr.Grover.   # April 3rd 2019- R-GDP [carbo]; MAY 2019- PET PR; NOV 2019- PR  #August 2020-recurrence bulky left pelvis/retroperitoneal [9-10 cm]; no biopsy. #August 24th-Rituxan weekly cycle #1; prednisone 60 mg a day.   06/03/2019 - Chemotherapy  BMT OP CAR-T LYMPHODEPLETION FLU/CY + IP AXICABTAGENE CILOLEUCEL (YESCARTA)  Fludarabine 30 mg/m2 IV Days 1, 2, 3 Cyclophosphamide 500 mg/m2 IV Days 1, 2, 3 Axicabtagene ciloleucel target dose 2 x 10^6 CAR-positive viable T cells per kg body weight (maximum of 2 x 10^8 CAR-positive viable T cells) on Day 6.  # SEP 2020- 88month PET-[facility]- significant PR.   #August 2020-2D echo ejection  fraction 60 to 65%  --------------------------------  # JAN 26th-LP  # Interstitial Lung disease [surveillance] --------------------------------------------------------------------------  DIAGNOSIS: [MARCH 2019 ] RECURRENT DLBCL  STAGE:   Recurrent ; GOALS: CURATIVE  CURRENT/MOST RECENT THERAPY: surveillaince   Diffuse large B-cell lymphoma of intra-abdominal lymph nodes (HCC)  04/13/2019 - 04/13/2019 Chemotherapy   The patient had riTUXimab-pvvr (RUXIENCE) 700 mg in sodium chloride 0.9 % 250 mL chemo infusion, , Intravenous, Once, 1 of 4 cycles Administration:  (04/13/2019)  for chemotherapy treatment.    05/07/2019 - 05/07/2019 Chemotherapy   The patient had riTUXimab-hyaluronidase human (RITUXAN HYCELA) 1400-23400 MG -UT/11.7ML injection SQ 1,400 mg, 1,400 mg, Subcutaneous,  Once, 1 of 4 cycles Administration: 1,400 mg (05/07/2019)  for chemotherapy treatment.      ALLERGIES:  is allergic to levaquin [levofloxacin in d5w], cefuroxime axetil, and doxycycline.  MEDICATIONS:  Current Outpatient Medications  Medication Sig Dispense Refill   acetaminophen (TYLENOL) 325 MG tablet Take by mouth.     b complex vitamins tablet Take 1 tablet by mouth daily.     buPROPion (WELLBUTRIN XL) 150 MG 24 hr tablet Take 150 mg by mouth at bedtime.     ciprofloxacin (CIPRO) 500 MG tablet Take 1 tablet (500 mg total) by mouth 2 (two) times daily. 14 tablet 0   dexamethasone (DECADRON) 4 MG tablet Take 1 tablet (4 mg total) by mouth 2 (two) times daily. For 1 day; prior to infusion. 30 tablet 0   gabapentin (NEURONTIN) 300 MG capsule Take 1 capsule (300 mg total) by mouth 2 (two) times daily. (Patient taking differently: Take 300 mg by mouth daily.) 60 capsule 3   ibuprofen (ADVIL) 200 MG tablet Take by mouth.     latanoprost (XALATAN) 0.005 % ophthalmic solution Place 1 drop into both eyes at bedtime.      metoprolol tartrate (LOPRESSOR) 25 MG tablet TAKE 1 TABLET BY MOUTH TWICE DAILY (Patient taking  differently: 12.5 mg 2 (two) times daily. (BETA BLOCKER)) 180 tablet 0   montelukast (SINGULAIR) 10 MG tablet TAKE 1 TABLET BY MOUTH AT BEDTIME 90 tablet 0   ondansetron (ZOFRAN) 8 MG tablet Take by mouth.     pantoprazole (PROTONIX) 40 MG tablet Take 1 tablet by mouth in the morning and at bedtime.     potassium chloride SA (KLOR-CON M) 20 MEQ tablet 1 pill twice a day x 1 week; and then one a day. 60 tablet 2   prochlorperazine (COMPAZINE) 10 MG tablet Take 10 mg by mouth every 6 (six) hours as needed for nausea or vomiting.     rosuvastatin (CRESTOR) 10 MG tablet Take 1 tablet by mouth daily.     valACYclovir (VALTREX) 500 MG tablet Take 1 tablet by mouth once daily 30 tablet 0   No current facility-administered medications for this visit.   Facility-Administered Medications Ordered in Other Visits  Medication Dose Route Frequency Provider Last Rate Last Admin   0.9 %  sodium chloride infusion   Intravenous Continuous Louretta Shorten R, MD 10 mL/hr at 06/19/21 1230 Continued from Pre-op at 06/19/21 1230   0.9 %  sodium chloride infusion   Intravenous Continuous Earna Coder, MD  Stopped at 12/26/16 1011   0.9 %  sodium chloride infusion   Intravenous Continuous Louretta Shorten R, MD 10 mL/hr at 12/27/16 0930 New Bag at 12/27/16 0930   heparin lock flush 100 unit/mL  500 Units Intravenous Once Louretta Shorten R, MD       heparin lock flush 100 unit/mL  500 Units Intravenous Once Louretta Shorten R, MD       sodium chloride flush (NS) 0.9 % injection 10 mL  10 mL Intravenous PRN Louretta Shorten R, MD   10 mL at 11/06/16 1000   sodium chloride flush (NS) 0.9 % injection 10 mL  10 mL Intravenous PRN Earna Coder, MD   10 mL at 11/07/16 0905   sodium chloride flush (NS) 0.9 % injection 10 mL  10 mL Intravenous PRN Earna Coder, MD       sodium chloride flush (NS) 0.9 % injection 10 mL  10 mL Intravenous PRN Louretta Shorten R, MD   10 mL at  12/05/16 1030   sodium chloride flush (NS) 0.9 % injection 10 mL  10 mL Intravenous PRN Louretta Shorten R, MD   10 mL at 12/27/16 0930   sodium chloride flush (NS) 0.9 % injection 10 mL  10 mL Intravenous PRN Earna Coder, MD   10 mL at 09/27/20 1305    VITAL SIGNS: There were no vitals taken for this visit. There were no vitals filed for this visit.  Estimated body mass index is 20.49 kg/m as calculated from the following:   Height as of 10/28/23: 5\' 7"  (1.702 m).   Weight as of 10/28/23: 130 lb 12.8 oz (59.3 kg).  LABS: CBC:    Component Value Date/Time   WBC 4.1 10/21/2023 1402   WBC 3.0 (L) 10/26/2022 1047   HGB 11.5 (L) 10/21/2023 1402   HGB 11.1 02/18/2023 0801   HCT 34.1 (L) 10/21/2023 1402   HCT 35.4 02/18/2023 0801   PLT 214 10/21/2023 1402   PLT 206 02/18/2023 0801   MCV 95.3 10/21/2023 1402   MCV 91 02/18/2023 0801   NEUTROABS 2.6 07/29/2023 1422   NEUTROABS 2.0 02/18/2023 0801   LYMPHSABS 0.5 (L) 07/29/2023 1422   LYMPHSABS 0.3 (L) 02/18/2023 0801   MONOABS 0.5 07/29/2023 1422   EOSABS 0.4 07/29/2023 1422   EOSABS 0.2 02/18/2023 0801   BASOSABS 0.1 07/29/2023 1422   BASOSABS 0.0 02/18/2023 0801   Comprehensive Metabolic Panel:    Component Value Date/Time   NA 138 10/21/2023 1402   K 2.9 (L) 10/21/2023 1402   K 4.1 07/01/2014 1429   CL 102 10/21/2023 1402   CO2 27 10/21/2023 1402   BUN 8 10/21/2023 1402   CREATININE 0.60 10/21/2023 1402   GLUCOSE 121 (H) 10/21/2023 1402   CALCIUM 8.4 (L) 10/21/2023 1402   AST 15 10/21/2023 1402   ALT 11 10/21/2023 1402   ALKPHOS 63 10/21/2023 1402   BILITOT 0.7 10/21/2023 1402   PROT 5.7 (L) 10/21/2023 1402   ALBUMIN 3.0 (L) 10/21/2023 1402    RADIOGRAPHIC STUDIES: No results found.  PERFORMANCE STATUS (ECOG) : 2 - Symptomatic, <50% confined to bed  Review of Systems Unless otherwise noted, a complete review of systems is negative.  Physical Exam General: NAD Cardiovascular: regular rate and  rhythm Pulmonary: clear ant fields Abdomen: soft, nontender, + bowel sounds GU: no suprapubic tenderness Extremities: no edema, no joint deformities Skin: no rashes Neurological: Weakness but otherwise nonfocal  IMPRESSION/PLAN:  DLBCL -on surveillance  Secondary immunodeficiency with recurrent infections -symptoms concerning for recurrent infection today.  Discussed with Dr. Donneta Romberg and will check UA/culture/chest x-ray/blood cultures and also send referral to ID.  Will restart patient on Cipro x 7 days.  Lower extremity edema -will check Dopplers to rule out DVT  Thrush -fluconazole 100 mg daily x 14 days  RTC in 1 week   Patient expressed understanding and was in agreement with this plan. She also understands that She can call clinic at any time with any questions, concerns, or complaints.   Thank you for allowing me to participate in the care of this very pleasant patient.   Time Total: 25 minutes  Visit consisted of counseling and education dealing with the complex and emotionally intense issues of symptom management in the setting of serious illness.Greater than 50%  of this time was spent counseling and coordinating care related to the above assessment and plan.  Signed by: Laurette Schimke, PhD, NP-C

## 2023-11-19 ENCOUNTER — Other Ambulatory Visit: Payer: Self-pay | Admitting: Hospice and Palliative Medicine

## 2023-11-19 ENCOUNTER — Ambulatory Visit
Admission: RE | Admit: 2023-11-19 | Discharge: 2023-11-19 | Disposition: A | Discharge status: Home / Self Care | Source: Ambulatory Visit | Attending: Hospice and Palliative Medicine | Admitting: Hospice and Palliative Medicine

## 2023-11-19 ENCOUNTER — Other Ambulatory Visit: Payer: Self-pay

## 2023-11-19 ENCOUNTER — Ambulatory Visit
Admission: RE | Admit: 2023-11-19 | Discharge: 2023-11-19 | Disposition: A | Discharge status: Home / Self Care | Source: Ambulatory Visit | Attending: Hospice and Palliative Medicine

## 2023-11-19 DIAGNOSIS — C8333 Diffuse large B-cell lymphoma, intra-abdominal lymph nodes: Secondary | ICD-10-CM | POA: Diagnosis present

## 2023-11-19 DIAGNOSIS — R350 Frequency of micturition: Secondary | ICD-10-CM

## 2023-11-19 DIAGNOSIS — R059 Cough, unspecified: Secondary | ICD-10-CM | POA: Diagnosis present

## 2023-11-19 DIAGNOSIS — R600 Localized edema: Secondary | ICD-10-CM | POA: Diagnosis present

## 2023-11-19 DIAGNOSIS — R300 Dysuria: Secondary | ICD-10-CM

## 2023-11-19 LAB — URINALYSIS, COMPLETE (UACMP) WITH MICROSCOPIC
Bilirubin Urine: NEGATIVE
Glucose, UA: NEGATIVE mg/dL
Ketones, ur: 5 mg/dL — AB
Nitrite: NEGATIVE
Protein, ur: NEGATIVE mg/dL
RBC / HPF: 0 RBC/hpf (ref 0–5)
Specific Gravity, Urine: 1.009 (ref 1.005–1.030)
pH: 6 (ref 5.0–8.0)

## 2023-11-19 LAB — CMV DNA, QUANTITATIVE, PCR
CMV DNA Quant: NEGATIVE [IU]/mL
Log10 CMV Qn DNA Pl: UNDETERMINED {Log_IU}/mL

## 2023-11-19 DEATH — deceased

## 2023-11-21 ENCOUNTER — Ambulatory Visit: Admitting: Infectious Diseases

## 2023-11-21 ENCOUNTER — Other Ambulatory Visit: Payer: Self-pay

## 2023-11-21 ENCOUNTER — Encounter: Payer: Self-pay | Admitting: Emergency Medicine

## 2023-11-21 ENCOUNTER — Encounter: Payer: Self-pay | Admitting: Internal Medicine

## 2023-11-21 ENCOUNTER — Emergency Department

## 2023-11-21 ENCOUNTER — Emergency Department
Admission: EM | Admit: 2023-11-21 | Discharge: 2023-11-21 | Disposition: A | Discharge status: Home / Self Care | Attending: Emergency Medicine | Admitting: Emergency Medicine

## 2023-11-21 DIAGNOSIS — U071 COVID-19: Secondary | ICD-10-CM | POA: Diagnosis not present

## 2023-11-21 DIAGNOSIS — Z8572 Personal history of non-Hodgkin lymphomas: Secondary | ICD-10-CM | POA: Insufficient documentation

## 2023-11-21 DIAGNOSIS — R55 Syncope and collapse: Secondary | ICD-10-CM | POA: Insufficient documentation

## 2023-11-21 DIAGNOSIS — E861 Hypovolemia: Secondary | ICD-10-CM | POA: Diagnosis not present

## 2023-11-21 DIAGNOSIS — D72819 Decreased white blood cell count, unspecified: Secondary | ICD-10-CM | POA: Diagnosis not present

## 2023-11-21 DIAGNOSIS — R634 Abnormal weight loss: Secondary | ICD-10-CM | POA: Diagnosis not present

## 2023-11-21 DIAGNOSIS — R531 Weakness: Secondary | ICD-10-CM | POA: Diagnosis not present

## 2023-11-21 DIAGNOSIS — R5381 Other malaise: Secondary | ICD-10-CM | POA: Diagnosis present

## 2023-11-21 LAB — CBC
HCT: 36.6 % (ref 36.0–46.0)
Hemoglobin: 12.2 g/dL (ref 12.0–15.0)
MCH: 30.9 pg (ref 26.0–34.0)
MCHC: 33.3 g/dL (ref 30.0–36.0)
MCV: 92.7 fL (ref 80.0–100.0)
Platelets: 263 10*3/uL (ref 150–400)
RBC: 3.95 MIL/uL (ref 3.87–5.11)
RDW: 13.6 % (ref 11.5–15.5)
WBC: 2.9 10*3/uL — ABNORMAL LOW (ref 4.0–10.5)
nRBC: 0 % (ref 0.0–0.2)

## 2023-11-21 LAB — BASIC METABOLIC PANEL WITH GFR
Anion gap: 9 (ref 5–15)
BUN: 8 mg/dL (ref 8–23)
CO2: 24 mmol/L (ref 22–32)
Calcium: 8.9 mg/dL (ref 8.9–10.3)
Chloride: 105 mmol/L (ref 98–111)
Creatinine, Ser: 0.47 mg/dL (ref 0.44–1.00)
GFR, Estimated: >60 mL/min (ref >60)
Glucose, Bld: 132 mg/dL — ABNORMAL HIGH (ref 70–99)
Potassium: 3.7 mmol/L (ref 3.5–5.1)
Sodium: 138 mmol/L (ref 135–145)

## 2023-11-21 LAB — RESP PANEL BY RT-PCR (RSV, FLU A&B, COVID)  RVPGX2
Influenza A by PCR: NEGATIVE
Influenza B by PCR: NEGATIVE
Resp Syncytial Virus by PCR: NEGATIVE
SARS Coronavirus 2 by RT PCR: POSITIVE — AB

## 2023-11-21 LAB — LACTIC ACID, PLASMA: Lactic Acid, Venous: 1.2 mmol/L (ref 0.5–1.9)

## 2023-11-21 MED ORDER — LIDOCAINE VISCOUS HCL 2 % MT SOLN: 15.0000 mL | OROMUCOSAL | 1 refills | Status: DC | PRN

## 2023-11-21 MED ORDER — LIDOCAINE VISCOUS HCL 2 % MT SOLN
15.0000 mL | Freq: Once | OROMUCOSAL | Status: AC
  Administered 2023-11-21: 15 mL via OROMUCOSAL
  Filled 2023-11-21: qty 15

## 2023-11-21 MED ORDER — LACTATED RINGERS IV BOLUS
1000.0000 mL | Freq: Once | INTRAVENOUS | Status: AC
  Administered 2023-11-21: 1000 mL via INTRAVENOUS

## 2023-11-21 NOTE — ED Provider Notes (Signed)
 [facility] Provider Note    Event Date/Time   First MD Initiated Contact with Patient 11/21/23 1141     (approximate)   History   No chief complaint on file.   HPI  Rose Hill is a 78 y.o. female who presents to the ED for evaluation of No chief complaint on file.   I review a cancer center clinic visit from 4 days ago.  Diffuse large B-cell lymphoma.  She is complaining of similar symptoms then and I review the workup sent from the clinic:  UA with large leukocytes and sent for a culture that is in process and not resulted, 2 blood cultures without growth.  She was started on a course of ciprofloxacin to treat UTI, Diflucan for oral thrush and right lower extremity venous study rules out DVT  Patient presents to the ED alongside her husband for evaluation of progressive decline and generalized weakness on a subacute timeframe since her last cancer treatment.  Patient reports 10 days 15 pound weight loss in the past month.  Generalized weakness, poor appetite, malaise without focal symptoms or particular event such as falling, syncope.  Reports oral pain.  She reports compliance with her ciprofloxacin and Diflucan.   Physical Exam   Triage Vital Signs: ED Triage Vitals  Encounter Vitals Group     BP 11/21/23 1034 103/63     Systolic BP Percentile --      Diastolic BP Percentile --      Pulse Rate 11/21/23 1034 97     Resp 11/21/23 1034 18     Temp 11/21/23 1034 98.5 F (36.9 C)     Temp Source 11/21/23 1034 Oral     SpO2 11/21/23 1034 96 %     Weight 11/21/23 1030 121 lb (54.9 kg)     Height 11/21/23 1030 5\' 7"  (1.702 m)     Head Circumference --      Peak Flow --      Pain Score 11/21/23 1030 9     Pain Loc --      Pain Education --      Exclude from Growth Chart --     Most recent vital signs: Vitals:   11/21/23 1200 11/21/23 1300  BP:    Pulse: (!) 103 86  Resp: (!) 27 (!) 21  Temp:    SpO2: 99% 97%    General: Awake, no  distress.  Frail and thin, chronically ill-appearing CV:  Good peripheral perfusion.  Resp:  Normal effort.  Abd:  No distention.  Soft MSK:  No deformity noted.  No signs of trauma Neuro:  No focal deficits appreciated. Other:     ED Results / Procedures / Treatments   Labs (all labs ordered are listed, but only abnormal results are displayed) Labs Reviewed  RESP PANEL BY RT-PCR (RSV, FLU A&B, COVID)  [id number] - Abnormal; Notable for the following components:      Result Value   SARS Coronavirus 2 by RT PCR POSITIVE (*)    All other components within normal limits  BASIC METABOLIC PANEL WITH GFR - Abnormal; Notable for the following components:   Glucose, Bld 132 (*)    All other components within normal limits  CBC - Abnormal; Notable for the following components:   WBC 2.9 (*)    All other components within normal limits  LACTIC ACID, PLASMA  URINALYSIS, ROUTINE W REFLEX MICROSCOPIC  CBG MONITORING, ED    EKG Sinus tachycardia with  a rate of 101 bpm.  Normal axis and intervals.  No clear signs of acute ischemia  RADIOLOGY CXR interpreted by me without evidence of acute cardiopulmonary pathology.  Official radiology report(s): DG Chest 2 View Result Date: 11/21/2023 CLINICAL DATA:  Weakness for 3 weeks.  History of lymphoma. EXAM: CHEST - 2 VIEW COMPARISON:  November 19, 2023. FINDINGS: The heart size and mediastinal contours are within normal limits. Right internal jugular Port-A-Cath is unchanged. Stable mild reticular densities are noted in the lungs most consistent with chronic interstitial lung disease or scarring. No definite acute pulmonary abnormality is noted. The visualized skeletal structures are unremarkable. IMPRESSION: Stable chronic findings as noted above. No definite acute abnormality seen. Electronically Signed   By: Lupita Raider M.D.   On: 11/21/2023 12:58    PROCEDURES and INTERVENTIONS:  Procedures  Medications  lactated ringers bolus 1,000 mL (0 mLs  Intravenous Stopped 11/21/23 1330)  lidocaine (XYLOCAINE) 2 % viscous mouth solution 15 mL (15 mLs Mouth/Throat Given 11/21/23 1248)     IMPRESSION / MDM / ASSESSMENT AND PLAN / ED COURSE  I reviewed the triage vital signs and the nursing notes.  Differential diagnosis includes, but is not limited to, deconditioning, protein calorie malnutrition, AKI or dehydration, UTI, sepsis or bacteremia, viral syndrome  {Patient presents with symptoms of an acute illness or injury that is potentially life-threatening.  The phone the patient presents with subacute weakness with signs of hypovolemia, poor intake from oral ulcers and COVID-positive, ultimately suitable for trial of outpatient management.  Her blood work is reassuring, mild leukopenia, normal metabolic panel, clear CXR.  We send her urine for a culture but she is already on ciprofloxacin and had a negative culture as an outpatient a few days ago.  As below, I offered admission to the patient but she declines, feeling better after oral lidocaine mouthwash and IV fluids.  We discussed care at home and return precautions.  Clinical Course as of 11/21/23 1518  Thu Nov 21, 2023  1451 Reassessed.  She reports feeling much better after the fluids and viscous lidocaine.  Tolerating p.o. intake.  We discussed COVID-positive and plan of care.  She would like to go home instead of observation admission.  She seems to have perked up a little bit and is eager to do so.  We discussed care at home and ED return precautions. [DS]    Clinical Course User Index [DS] Delton Prairie, MD     FINAL CLINICAL IMPRESSION(S) / ED DIAGNOSES   Final diagnoses:  COVID-19     Rx / DC Orders   ED Discharge Orders          Ordered    lidocaine (XYLOCAINE) 2 % solution  As needed        11/21/23 1453             Note:  This document was prepared using Dragon voice recognition software and may include unintentional dictation errors.   Delton Prairie,  MD 11/21/23 [phone]

## 2023-11-21 NOTE — Discharge Instructions (Signed)
 Use Tylenol for pain and fevers.  Up to 1000 mg per dose, up to 4 times per day.  Do not take more than 4000 mg of Tylenol/acetaminophen within 24 hours..  Use the lidocaine solution up to 2-3 times per day as needed for your mouth sores to help eat and drink.  Follow-up with the cancer center  Return to the ED as needed for any worsening symptoms despite these measures.

## 2023-11-21 NOTE — ED Triage Notes (Signed)
 Pt via POV from home. Pt c/o weakness and loss of appetite for the past 3 weeks. Report painful "sores" in her mouth. States that she has a hx of lymphoma but denies any current treatment. Pt is A&Ox4 and NAD, ambulatory to triage.

## 2023-11-23 LAB — CULTURE, BLOOD (ROUTINE X 2)
Culture: NO GROWTH
Culture: NO GROWTH
Special Requests: ADEQUATE
Special Requests: ADEQUATE

## 2023-11-25 ENCOUNTER — Telehealth: Payer: Self-pay

## 2023-11-25 ENCOUNTER — Encounter: Admitting: Hospice and Palliative Medicine

## 2023-11-25 ENCOUNTER — Other Ambulatory Visit

## 2023-11-25 LAB — URINE CULTURE

## 2023-11-25 MED ORDER — NITROFURANTOIN MONOHYD MACRO 100 MG PO CAPS: 100.0000 mg | ORAL_CAPSULE | Freq: Two times a day (BID) | ORAL | 0 refills | Status: DC

## 2023-11-25 MED ORDER — NYSTATIN 100000 UNIT/ML MT SUSP: 5.0000 mL | Freq: Four times a day (QID) | OROMUCOSAL | 1 refills | Status: DC

## 2023-11-25 NOTE — Telephone Encounter (Signed)
 Error

## 2023-11-25 NOTE — Telephone Encounter (Signed)
 Urine culture resulted today.  Positive for STAPHYLOCOCCUS EPIDERMIDIS which is Resistant to Cipro that was prescribed by Josh at last visit.    Called patient to assess symptoms.  Denies specific symptoms but low grade fever and is unsure if related to other sickness.  Josh would like patient to stop Cipro (has 1 does left) and start Macrobid 100mg  BID x 7 days.  Mr. Govan states that she has not taken Diflucan prescribed by Josh at last visit because pharmacist advised she not take at same time as Cipro.  Josh also recommends Nystatin mouthwash.  Patient's husband is aware of plan.

## 2023-11-27 ENCOUNTER — Telehealth: Payer: Self-pay | Admitting: *Deleted

## 2023-11-27 NOTE — Telephone Encounter (Signed)
 The patient has covid and we will cancel the appt this week , Josh called in nystatin and macrobid. She is very tired  no energy, slight cough sometimes but the fatigue is the biggest thing. She would like to have her appts mailed for her.

## 2023-11-28 ENCOUNTER — Inpatient Hospital Stay

## 2023-11-28 ENCOUNTER — Inpatient Hospital Stay: Admitting: Hospice and Palliative Medicine

## 2023-11-28 ENCOUNTER — Inpatient Hospital Stay: Attending: Internal Medicine

## 2023-11-29 ENCOUNTER — Emergency Department

## 2023-11-29 ENCOUNTER — Encounter: Payer: Self-pay | Admitting: Emergency Medicine

## 2023-11-29 ENCOUNTER — Other Ambulatory Visit: Payer: Self-pay

## 2023-11-29 ENCOUNTER — Inpatient Hospital Stay
Admission: EM | Admit: 2023-11-29 | Discharge: 2023-12-03 | DRG: 640 | Disposition: A | Discharge status: Home Health | Attending: Internal Medicine | Admitting: Internal Medicine

## 2023-11-29 DIAGNOSIS — E43 Unspecified severe protein-calorie malnutrition: Secondary | ICD-10-CM | POA: Diagnosis present

## 2023-11-29 DIAGNOSIS — T451X5A Adverse effect of antineoplastic and immunosuppressive drugs, initial encounter: Secondary | ICD-10-CM | POA: Diagnosis present

## 2023-11-29 DIAGNOSIS — B370 Candidal stomatitis: Secondary | ICD-10-CM | POA: Diagnosis present

## 2023-11-29 DIAGNOSIS — Z8249 Family history of ischemic heart disease and other diseases of the circulatory system: Secondary | ICD-10-CM

## 2023-11-29 DIAGNOSIS — I119 Hypertensive heart disease without heart failure: Secondary | ICD-10-CM | POA: Diagnosis present

## 2023-11-29 DIAGNOSIS — N3090 Cystitis, unspecified without hematuria: Secondary | ICD-10-CM | POA: Diagnosis present

## 2023-11-29 DIAGNOSIS — Z7969 Long term (current) use of other immunomodulators and immunosuppressants: Secondary | ICD-10-CM | POA: Diagnosis present

## 2023-11-29 DIAGNOSIS — Z888 Allergy status to other drugs, medicaments and biological substances status: Secondary | ICD-10-CM

## 2023-11-29 DIAGNOSIS — Z9889 Other specified postprocedural states: Secondary | ICD-10-CM | POA: Insufficient documentation

## 2023-11-29 DIAGNOSIS — D72819 Decreased white blood cell count, unspecified: Secondary | ICD-10-CM

## 2023-11-29 DIAGNOSIS — R64 Cachexia: Secondary | ICD-10-CM | POA: Diagnosis present

## 2023-11-29 DIAGNOSIS — R1310 Dysphagia, unspecified: Secondary | ICD-10-CM

## 2023-11-29 DIAGNOSIS — R6251 Failure to thrive (child): Secondary | ICD-10-CM | POA: Diagnosis present

## 2023-11-29 DIAGNOSIS — Z923 Personal history of irradiation: Secondary | ICD-10-CM

## 2023-11-29 DIAGNOSIS — Z79899 Other long term (current) drug therapy: Secondary | ICD-10-CM

## 2023-11-29 DIAGNOSIS — R54 Age-related physical debility: Secondary | ICD-10-CM | POA: Diagnosis present

## 2023-11-29 DIAGNOSIS — N390 Urinary tract infection, site not specified: Secondary | ICD-10-CM

## 2023-11-29 DIAGNOSIS — U071 COVID-19: Principal | ICD-10-CM | POA: Diagnosis present

## 2023-11-29 DIAGNOSIS — C8333 Diffuse large B-cell lymphoma, intra-abdominal lymph nodes: Secondary | ICD-10-CM | POA: Diagnosis present

## 2023-11-29 DIAGNOSIS — J939 Pneumothorax, unspecified: Secondary | ICD-10-CM | POA: Diagnosis present

## 2023-11-29 DIAGNOSIS — J982 Interstitial emphysema: Secondary | ICD-10-CM | POA: Diagnosis present

## 2023-11-29 DIAGNOSIS — E7800 Pure hypercholesterolemia, unspecified: Secondary | ICD-10-CM | POA: Diagnosis present

## 2023-11-29 DIAGNOSIS — R627 Adult failure to thrive: Principal | ICD-10-CM | POA: Diagnosis present

## 2023-11-29 DIAGNOSIS — E119 Type 2 diabetes mellitus without complications: Secondary | ICD-10-CM | POA: Diagnosis present

## 2023-11-29 DIAGNOSIS — I10 Essential (primary) hypertension: Secondary | ICD-10-CM | POA: Diagnosis present

## 2023-11-29 DIAGNOSIS — Z6820 Body mass index (BMI) 20.0-20.9, adult: Secondary | ICD-10-CM

## 2023-11-29 DIAGNOSIS — Z87891 Personal history of nicotine dependence: Secondary | ICD-10-CM

## 2023-11-29 DIAGNOSIS — D709 Neutropenia, unspecified: Secondary | ICD-10-CM | POA: Diagnosis present

## 2023-11-29 DIAGNOSIS — Z881 Allergy status to other antibiotic agents status: Secondary | ICD-10-CM

## 2023-11-29 DIAGNOSIS — Z87440 Personal history of urinary (tract) infections: Secondary | ICD-10-CM

## 2023-11-29 DIAGNOSIS — Z1623 Resistance to quinolones and fluoroquinolones: Secondary | ICD-10-CM | POA: Diagnosis present

## 2023-11-29 DIAGNOSIS — D84821 Immunodeficiency due to drugs: Secondary | ICD-10-CM | POA: Diagnosis present

## 2023-11-29 DIAGNOSIS — J849 Interstitial pulmonary disease, unspecified: Secondary | ICD-10-CM | POA: Diagnosis present

## 2023-11-29 LAB — URINALYSIS, ROUTINE W REFLEX MICROSCOPIC
Bilirubin Urine: NEGATIVE
Glucose, UA: NEGATIVE mg/dL
Ketones, ur: 20 mg/dL — AB
Leukocytes,Ua: NEGATIVE
Nitrite: NEGATIVE
Protein, ur: 30 mg/dL — AB
Specific Gravity, Urine: 1.021 (ref 1.005–1.030)
pH: 5 (ref 5.0–8.0)

## 2023-11-29 LAB — CBC WITH DIFFERENTIAL/PLATELET
Abs Immature Granulocytes: 0.31 10*3/uL — ABNORMAL HIGH (ref 0.00–0.07)
Basophils Absolute: 0.1 10*3/uL (ref 0.0–0.1)
Basophils Relative: 2 %
Eosinophils Absolute: 0.4 10*3/uL (ref 0.0–0.5)
Eosinophils Relative: 11 %
HCT: 41.7 % (ref 36.0–46.0)
Hemoglobin: 13.7 g/dL (ref 12.0–15.0)
Immature Granulocytes: 9 %
Lymphocytes Relative: 6 %
Lymphs Abs: 0.2 10*3/uL — ABNORMAL LOW (ref 0.7–4.0)
MCH: 30.6 pg (ref 26.0–34.0)
MCHC: 32.9 g/dL (ref 30.0–36.0)
MCV: 93.3 fL (ref 80.0–100.0)
Monocytes Absolute: 0.5 10*3/uL (ref 0.1–1.0)
Monocytes Relative: 15 %
Neutro Abs: 2 10*3/uL (ref 1.7–7.7)
Neutrophils Relative %: 57 %
Platelets: 262 10*3/uL (ref 150–400)
RBC: 4.47 MIL/uL (ref 3.87–5.11)
RDW: 13.7 % (ref 11.5–15.5)
WBC: 3.4 10*3/uL — ABNORMAL LOW (ref 4.0–10.5)
nRBC: 0 % (ref 0.0–0.2)

## 2023-11-29 LAB — BASIC METABOLIC PANEL WITH GFR
Anion gap: 13 (ref 5–15)
BUN: 8 mg/dL (ref 8–23)
CO2: 23 mmol/L (ref 22–32)
Calcium: 9.6 mg/dL (ref 8.9–10.3)
Chloride: 102 mmol/L (ref 98–111)
Creatinine, Ser: 0.43 mg/dL — ABNORMAL LOW (ref 0.44–1.00)
GFR, Estimated: >60 mL/min (ref >60)
Glucose, Bld: 123 mg/dL — ABNORMAL HIGH (ref 70–99)
Potassium: 4.2 mmol/L (ref 3.5–5.1)
Sodium: 138 mmol/L (ref 135–145)

## 2023-11-29 MED ORDER — MAGIC MOUTHWASH
15.0000 mL | Freq: Once | ORAL | Status: AC
  Administered 2023-11-29: 15 mL via ORAL
  Filled 2023-11-29: qty 15

## 2023-11-29 MED ORDER — IOHEXOL 300 MG/ML  SOLN
75.0000 mL | Freq: Once | INTRAMUSCULAR | Status: AC | PRN
  Administered 2023-11-29: 75 mL via INTRAVENOUS

## 2023-11-29 MED ORDER — SODIUM CHLORIDE 0.9 % IV SOLN
500.0000 mg | Freq: Once | INTRAVENOUS | Status: AC
  Administered 2023-11-29: 500 mg via INTRAVENOUS
  Filled 2023-11-29: qty 5

## 2023-11-29 MED ORDER — SODIUM CHLORIDE 0.9 % IV BOLUS
1000.0000 mL | Freq: Once | INTRAVENOUS | Status: AC
  Administered 2023-11-29: 1000 mL via INTRAVENOUS

## 2023-11-29 MED ORDER — SODIUM CHLORIDE 0.9 % IV SOLN
2.0000 g | Freq: Once | INTRAVENOUS | Status: AC
  Administered 2023-11-29: 2 g via INTRAVENOUS
  Filled 2023-11-29: qty 20

## 2023-11-29 MED ORDER — IOHEXOL 300 MG/ML  SOLN
100.0000 mL | Freq: Once | INTRAMUSCULAR | Status: AC | PRN
  Administered 2023-11-29: 100 mL via INTRAVENOUS

## 2023-11-29 NOTE — ED Provider Notes (Signed)
 [facility] Provider Note    Event Date/Time   First MD Initiated Contact with Patient 11/29/23 1950     (approximate)   History   Urinary Frequency   HPI EMMACLAIRE SWITALA is a 78 y.o. female with history of HTN, DM2, HLD, diffuse large B-cell lymphoma presenting today for weakness.  Patient was seen 8 days ago for weakness and weight loss.  Found to have COVID but otherwise was found to be safe for discharge.  Since then she is no longer able to ambulate due to shortness of breath and weakness.  Not tolerating any p.o. secondary to intermittent nausea and mouth sores.  Denied any specific abdominal pain.  Denied any dysuria but notes she is also not able to make any urine over the past several days.  No constipation or diarrhea.  No chest pain.  Chart review: Patient seen in the ED 8 days ago for weakness and weight loss.  She was being treated for a UTI with ciprofloxacin at that time.  She was positive for COVID-19 but otherwise was reportedly suitable for outpatient management.     Physical Exam   Triage Vital Signs: ED Triage Vitals  Encounter Vitals Group     BP 11/29/23 1921 138/82     Systolic BP Percentile --      Diastolic BP Percentile --      Pulse Rate 11/29/23 1921 (!) 109     Resp 11/29/23 1921 16     Temp 11/29/23 1921 98.4 F (36.9 C)     Temp Source 11/29/23 1921 Oral     SpO2 11/29/23 1921 97 %     Weight 11/29/23 1919 120 lb (54.4 kg)     Height --      Head Circumference --      Peak Flow --      Pain Score --      Pain Loc --      Pain Education --      Exclude from Growth Chart --     Most recent vital signs: Vitals:   11/29/23 2000 11/29/23 2030  BP: (!) 141/73 129/64  Pulse: (!) 123 (!) 109  Resp: (!) 21 (!) 22  Temp:    SpO2: 91% 98%   Physical Exam: I have reviewed the vital signs and nursing notes. General: Awake, alert, no acute distress.  Fatigued appearing. Head:  Atraumatic, normocephalic.   ENT:  EOM  intact, PERRL. Oral mucosa is pink and moist with no lesions. Neck: Neck is supple with full range of motion, No meningeal signs. Cardiovascular:  RRR, No murmurs. Peripheral pulses palpable and equal bilaterally. Respiratory:  Symmetrical chest wall expansion.  No rhonchi, rales, or wheezes.  Good air movement throughout.  No use of accessory muscles.   Musculoskeletal:  No cyanosis or edema. Moving extremities with full ROM Abdomen:  Soft, tender to palpation right lower quadrant, nondistended. Neuro:  GCS 15, moving all four extremities, interacting appropriately. Speech clear. Psych:  Calm, appropriate.   Skin:  Warm, dry, no rash.    ED Results / Procedures / Treatments   Labs (all labs ordered are listed, but only abnormal results are displayed) Labs Reviewed  URINALYSIS, ROUTINE W REFLEX MICROSCOPIC - Abnormal; Notable for the following components:      Result Value   Color, Urine AMBER (*)    APPearance HAZY (*)    Hgb urine dipstick SMALL (*)    Ketones, ur 20 (*)  Protein, ur 30 (*)    Bacteria, UA RARE (*)    All other components within normal limits  CBC WITH DIFFERENTIAL/PLATELET - Abnormal; Notable for the following components:   WBC 3.4 (*)    Lymphs Abs 0.2 (*)    Abs Immature Granulocytes 0.31 (*)    All other components within normal limits  BASIC METABOLIC PANEL WITH GFR - Abnormal; Notable for the following components:   Glucose, Bld 123 (*)    Creatinine, Ser 0.43 (*)    All other components within normal limits     EKG    RADIOLOGY Independently interpreted CT abdomen/pelvis with no acute pathology.  Independently interpreted CT chest with what appears to be a very small intra fissure pneumothorax.   PROCEDURES:  Critical Care performed: No  Procedures   MEDICATIONS ORDERED IN ED: Medications  cefTRIAXone (ROCEPHIN) 2 g in sodium chloride 0.9 % 100 mL IVPB (2 g Intravenous New Bag/Given 11/29/23 2315)  azithromycin (ZITHROMAX) 500 mg in  sodium chloride 0.9 % 250 mL IVPB (has no administration in time range)  sodium chloride 0.9 % bolus 1,000 mL (1,000 mLs Intravenous New Bag/Given 11/29/23 2050)  magic mouthwash (15 mLs Oral Given 11/29/23 2125)  iohexol (OMNIPAQUE) 300 MG/ML solution 100 mL (100 mLs Intravenous Contrast Given 11/29/23 2148)  iohexol (OMNIPAQUE) 300 MG/ML solution 75 mL (75 mLs Intravenous Contrast Given 11/29/23 2230)     IMPRESSION / MDM / ASSESSMENT AND PLAN / ED COURSE  I reviewed the triage vital signs and the nursing notes.                              Differential diagnosis includes, but is not limited to, weakness secondary to COVID, dehydration, electrolyte abnormality, appendicitis, colitis, enteritis  Patient's presentation is most consistent with acute presentation with potential threat to life or bodily function.  Patient is a 78 year old female presenting today for profound weakness and dehydration.  Essentially not taking any p.o. intake for several days in the setting of known COVID.  Unable to ambulate due to shortness of breath and weakness.  She is very fatigued appearing with slight tachycardia on exam otherwise vital signs stable.  Does have notable tenderness in the right lower quadrant which she was not aware of so we will get CT imaging to rule out acute intra-abdominal infection.  Was given 1 L fluids as well as Magic mouthwash to help with her mouth sores.  Laboratory workup shows leukopenia but no anemia.  BMP largely reassuring.  UA without obvious infection.  CT abdomen/pelvis shows no acute pathology.  Initial x-ray with concern for possible developing right-sided pneumonia.  Follow-up CT chest ordered given concerns for possible pneumomediastinum seen on CT abdomen/pelvis.  CT chest shows what appears to be almost an intra fissure small pneumothorax.  Certainly nothing that can be a tenable to a chest tube and she has no hypoxia, tachypnea, or chest pain..  Patient was started on  ceftriaxone azithromycin to treat potential secondary bacterial pneumonia.  Patient admitted to hospitalist for ongoing care.  She otherwise appears stable outside of her slight tachycardia.  No hypoxia or tachypnea.  The patient is on the cardiac monitor to evaluate for evidence of arrhythmia and/or significant heart rate changes. Clinical Course as of 11/29/23 2321  Fri Nov 29, 2023  2227 Radiology - pneumomediastinum. Definitely can be present with covid and coughing [DW]    Clinical Course User Index [DW]  Janith Lima, MD     FINAL CLINICAL IMPRESSION(S) / ED DIAGNOSES   Final diagnoses:  COVID-19  Failure to thrive in adult  Pneumothorax, unspecified type     Rx / DC Orders   ED Discharge Orders     None        Note:  This document was prepared using Dragon voice recognition software and may include unintentional dictation errors.   Janith Lima, MD 11/29/23 2322

## 2023-11-29 NOTE — Assessment & Plan Note (Addendum)
 On admission. Frailty/physical deconditioning with ambulatory dysfunction. Poor oral intake Suspect secondary to recent acute infections including UTI, COVID, oral candidiasis as well as nutritional related to poor oral intake PT and nutritional consult Supportive care Full liquid diet for now Continue Megace  11-30-2023 FTT due to odynophagia and mouth sores. change to mechanical soft diet. Treat her oral thrush with IV diflucan, oral nystatin swish and spit.

## 2023-11-29 NOTE — Assessment & Plan Note (Addendum)
 On admission. Supportive care. Outside of therapeutic window of antiviral therapy. Patient is not hypoxic  11-30-2023 continue supportive care

## 2023-11-29 NOTE — ED Notes (Signed)
 Pt currently on Macrobid for recent dx UTI

## 2023-11-29 NOTE — ED Triage Notes (Signed)
 Pt arrives from home via ems c/c unresolved Dx UTI with home PO ABT therapy continued pt aox4 not on home O2 arrives on 2L via [place] reports dysuria, mouth sores, and ankle sores pt aox4 EKG completed @ bs

## 2023-11-29 NOTE — Assessment & Plan Note (Addendum)
 On admission. Long-term current use of IVIG. Continue prophylactic Valtrex  11-30-2023 chronic.

## 2023-11-29 NOTE — ED Notes (Signed)
 Pt cannot stand for any length of time although muscle strength is moderate pt reports feeling weak pt hx CA

## 2023-11-30 ENCOUNTER — Observation Stay

## 2023-11-30 ENCOUNTER — Encounter: Payer: Self-pay | Admitting: Internal Medicine

## 2023-11-30 DIAGNOSIS — J982 Interstitial emphysema: Secondary | ICD-10-CM

## 2023-11-30 DIAGNOSIS — Z888 Allergy status to other drugs, medicaments and biological substances status: Secondary | ICD-10-CM | POA: Diagnosis not present

## 2023-11-30 DIAGNOSIS — Z881 Allergy status to other antibiotic agents status: Secondary | ICD-10-CM | POA: Diagnosis not present

## 2023-11-30 DIAGNOSIS — I119 Hypertensive heart disease without heart failure: Secondary | ICD-10-CM | POA: Diagnosis present

## 2023-11-30 DIAGNOSIS — R6251 Failure to thrive (child): Secondary | ICD-10-CM

## 2023-11-30 DIAGNOSIS — Z9889 Other specified postprocedural states: Secondary | ICD-10-CM | POA: Insufficient documentation

## 2023-11-30 DIAGNOSIS — E119 Type 2 diabetes mellitus without complications: Secondary | ICD-10-CM | POA: Diagnosis present

## 2023-11-30 DIAGNOSIS — Z87891 Personal history of nicotine dependence: Secondary | ICD-10-CM | POA: Diagnosis not present

## 2023-11-30 DIAGNOSIS — Z6820 Body mass index (BMI) 20.0-20.9, adult: Secondary | ICD-10-CM | POA: Diagnosis not present

## 2023-11-30 DIAGNOSIS — R1310 Dysphagia, unspecified: Secondary | ICD-10-CM | POA: Diagnosis not present

## 2023-11-30 DIAGNOSIS — Z1623 Resistance to quinolones and fluoroquinolones: Secondary | ICD-10-CM | POA: Diagnosis present

## 2023-11-30 DIAGNOSIS — D709 Neutropenia, unspecified: Secondary | ICD-10-CM | POA: Diagnosis present

## 2023-11-30 DIAGNOSIS — N3090 Cystitis, unspecified without hematuria: Secondary | ICD-10-CM | POA: Diagnosis present

## 2023-11-30 DIAGNOSIS — C8333 Diffuse large B-cell lymphoma, intra-abdominal lymph nodes: Secondary | ICD-10-CM | POA: Diagnosis present

## 2023-11-30 DIAGNOSIS — B370 Candidal stomatitis: Secondary | ICD-10-CM | POA: Diagnosis present

## 2023-11-30 DIAGNOSIS — E7800 Pure hypercholesterolemia, unspecified: Secondary | ICD-10-CM | POA: Diagnosis present

## 2023-11-30 DIAGNOSIS — Z923 Personal history of irradiation: Secondary | ICD-10-CM | POA: Diagnosis not present

## 2023-11-30 DIAGNOSIS — U071 COVID-19: Secondary | ICD-10-CM

## 2023-11-30 DIAGNOSIS — R627 Adult failure to thrive: Secondary | ICD-10-CM

## 2023-11-30 DIAGNOSIS — T451X5A Adverse effect of antineoplastic and immunosuppressive drugs, initial encounter: Secondary | ICD-10-CM | POA: Diagnosis present

## 2023-11-30 DIAGNOSIS — R54 Age-related physical debility: Secondary | ICD-10-CM | POA: Diagnosis present

## 2023-11-30 DIAGNOSIS — Z79899 Other long term (current) drug therapy: Secondary | ICD-10-CM | POA: Diagnosis not present

## 2023-11-30 DIAGNOSIS — Z8249 Family history of ischemic heart disease and other diseases of the circulatory system: Secondary | ICD-10-CM | POA: Diagnosis not present

## 2023-11-30 DIAGNOSIS — D72819 Decreased white blood cell count, unspecified: Secondary | ICD-10-CM

## 2023-11-30 DIAGNOSIS — Z87440 Personal history of urinary (tract) infections: Secondary | ICD-10-CM | POA: Diagnosis not present

## 2023-11-30 DIAGNOSIS — J939 Pneumothorax, unspecified: Secondary | ICD-10-CM

## 2023-11-30 DIAGNOSIS — R64 Cachexia: Secondary | ICD-10-CM | POA: Diagnosis present

## 2023-11-30 DIAGNOSIS — E43 Unspecified severe protein-calorie malnutrition: Secondary | ICD-10-CM | POA: Diagnosis present

## 2023-11-30 DIAGNOSIS — I10 Essential (primary) hypertension: Secondary | ICD-10-CM

## 2023-11-30 MED ORDER — ENSURE MAX PROTEIN PO LIQD
11.0000 [oz_av] | Freq: Two times a day (BID) | ORAL | Status: DC
  Administered 2023-11-30 – 2023-12-02 (×5): 11 [oz_av] via ORAL
  Filled 2023-11-30: qty 330

## 2023-11-30 MED ORDER — ACETAMINOPHEN 325 MG PO TABS: 650.0000 mg | ORAL_TABLET | Freq: Four times a day (QID) | ORAL | Status: DC | PRN

## 2023-11-30 MED ORDER — ONDANSETRON HCL 4 MG/2ML IJ SOLN: 4.0000 mg | Freq: Four times a day (QID) | INTRAMUSCULAR | Status: DC | PRN

## 2023-11-30 MED ORDER — ACETAMINOPHEN 650 MG RE SUPP
650.0000 mg | Freq: Four times a day (QID) | RECTAL | Status: DC | PRN
Start: 2023-11-30 — End: 2023-12-03

## 2023-11-30 MED ORDER — ROSUVASTATIN CALCIUM 10 MG PO TABS: 10.0000 mg | ORAL_TABLET | Freq: Every day | ORAL | Status: DC

## 2023-11-30 MED ORDER — FLUCONAZOLE IN SODIUM CHLORIDE 200-0.9 MG/100ML-% IV SOLN: 200.0000 mg | Freq: Once | INTRAVENOUS | Status: DC

## 2023-11-30 MED ORDER — METOPROLOL TARTRATE 25 MG PO TABS
12.5000 mg | ORAL_TABLET | Freq: Two times a day (BID) | ORAL | Status: DC
  Administered 2023-11-30 – 2023-12-03 (×7): 12.5 mg via ORAL
  Filled 2023-11-30 (×7): qty 1

## 2023-11-30 MED ORDER — FLUCONAZOLE 100MG IVPB: 100.0000 mg | INTRAVENOUS | Status: DC

## 2023-11-30 MED ORDER — BUPROPION HCL ER (XL) 150 MG PO TB24: 150.0000 mg | ORAL_TABLET | Freq: Every day | ORAL | Status: DC

## 2023-11-30 MED ORDER — MAGIC MOUTHWASH W/LIDOCAINE
15.0000 mL | Freq: Four times a day (QID) | ORAL | Status: DC
  Administered 2023-11-30 – 2023-12-03 (×12): 15 mL via ORAL
  Filled 2023-11-30 (×16): qty 15

## 2023-11-30 MED ORDER — ENOXAPARIN SODIUM 40 MG/0.4ML IJ SOSY
40.0000 mg | PREFILLED_SYRINGE | INTRAMUSCULAR | Status: DC
  Administered 2023-11-30 – 2023-12-03 (×4): 40 mg via SUBCUTANEOUS
  Filled 2023-11-30 (×4): qty 0.4

## 2023-11-30 MED ORDER — FLUCONAZOLE IN SODIUM CHLORIDE 200-0.9 MG/100ML-% IV SOLN
200.0000 mg | INTRAVENOUS | Status: DC
Start: 2023-11-30 — End: 2023-12-03
  Administered 2023-11-30 – 2023-12-03 (×4): 200 mg via INTRAVENOUS
  Filled 2023-11-30 (×4): qty 100

## 2023-11-30 MED ORDER — PIPERACILLIN-TAZOBACTAM 3.375 G IVPB
3.3750 g | Freq: Three times a day (TID) | INTRAVENOUS | Status: DC
  Filled 2023-11-30: qty 50

## 2023-11-30 MED ORDER — NITROFURANTOIN MONOHYD MACRO 100 MG PO CAPS
100.0000 mg | ORAL_CAPSULE | Freq: Two times a day (BID) | ORAL | Status: DC
  Administered 2023-11-30 – 2023-12-03 (×7): 100 mg via ORAL
  Filled 2023-11-30 (×7): qty 1

## 2023-11-30 MED ORDER — HYDROCODONE-ACETAMINOPHEN 5-325 MG PO TABS: 1.0000 | ORAL_TABLET | ORAL | Status: DC | PRN

## 2023-11-30 MED ORDER — NYSTATIN 100000 UNIT/ML MT SUSP
5.0000 mL | Freq: Four times a day (QID) | OROMUCOSAL | Status: DC
Start: 2023-11-30 — End: 2023-11-30

## 2023-11-30 MED ORDER — VALACYCLOVIR HCL 500 MG PO TABS
500.0000 mg | ORAL_TABLET | Freq: Every day | ORAL | Status: DC
Start: 2023-11-30 — End: 2023-12-03
  Administered 2023-11-30 – 2023-12-03 (×4): 500 mg via ORAL
  Filled 2023-11-30 (×4): qty 1

## 2023-11-30 MED ORDER — MEGESTROL ACETATE 20 MG PO TABS
20.0000 mg | ORAL_TABLET | Freq: Every day | ORAL | Status: DC
Start: 2023-11-30 — End: 2023-12-03
  Administered 2023-11-30 – 2023-12-03 (×4): 20 mg via ORAL
  Filled 2023-11-30 (×4): qty 1

## 2023-11-30 MED ORDER — FLUCONAZOLE 100 MG PO TABS
100.0000 mg | ORAL_TABLET | Freq: Every day | ORAL | Status: DC
Start: 2023-11-30 — End: 2023-11-30

## 2023-11-30 MED ORDER — MORPHINE SULFATE (PF) 2 MG/ML IV SOLN: 2.0000 mg | INTRAVENOUS | Status: DC | PRN

## 2023-11-30 MED ORDER — LIDOCAINE VISCOUS HCL 2 % MT SOLN: 15.0000 mL | OROMUCOSAL | Status: DC | PRN

## 2023-11-30 MED ORDER — KETOROLAC TROMETHAMINE 15 MG/ML IJ SOLN: 15.0000 mg | Freq: Once | INTRAMUSCULAR | Status: DC | PRN

## 2023-11-30 MED ORDER — ONDANSETRON HCL 4 MG PO TABS: 4.0000 mg | ORAL_TABLET | Freq: Four times a day (QID) | ORAL | Status: DC | PRN

## 2023-11-30 NOTE — Assessment & Plan Note (Addendum)
 11-30-2023 chronic. Likely the cause of her pneumothorax

## 2023-11-30 NOTE — Assessment & Plan Note (Deleted)
 CPAP nightly

## 2023-11-30 NOTE — Assessment & Plan Note (Addendum)
 On admission. Small pneumothorax 5%. Per radiology/EDP conversation that possibly related to rupture of small bleb. Patient clinically stable-monitor for signs of decompensation  11-30-2023 esophageal perforation ruled out this morning with noncontrasted chest CT after discussion with radiologist.  Patient was initially placed on prophylactic Zosyn pending her CT chest.  Since she does not have a esophageal perforation, can stop the IV Zosyn.  More likely her pneumomediastinum is from her pneumothorax.  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  Her pneumothorax has already decreased in size since yesterday.

## 2023-11-30 NOTE — Assessment & Plan Note (Addendum)
 On admission. Long-term current use of IVIG. Continue prophylactic Valtrex  11-30-2023 chronic. Received dose in March 2023 in heme/onc clinic.

## 2023-11-30 NOTE — Assessment & Plan Note (Addendum)
 On admission. Secondary immunodeficiency .Recurrent UTIs (Klebsiella and Proteus 3/10). Oral thrush 11/18/2023 on IVIG infusion and prophylactic Valtrex, Urinalysis unremarkable Continue oral fluconazole for 14 days (3/31-4/13/25)  11-30-2023 chronic.  She is status post CAR-T therapy several years ago.  Followed by oncology at [facility] oncology

## 2023-11-30 NOTE — Evaluation (Addendum)
 Clinical/Bedside Swallow Evaluation Patient Details  Name: Rose Hill MRN: [id number] Date of Birth: October 04, 1945  Today's Date: 11/30/2023 Time: SLP Start Time (ACUTE ONLY): 1055 SLP Stop Time (ACUTE ONLY): 1115 SLP Time Calculation (min) (ACUTE ONLY): 20 min  Past Medical History:  Past Medical History:  Diagnosis Date   Diabetes mellitus without complication (HCC)    Diffuse large B-cell lymphoma (HCC)    Chemo + rad tx's   Dysplastic nevus 03/14/2020   left inf med scapula moderate atypia    Dysplastic nevus 03/14/2020   left lat breast parallel to areaola, moderate atypia    Heart murmur    History of chemotherapy 07/08/2017   History of gastric ulcer    History of radiation therapy 07/08/2017   Hypercholesterolemia    Hypertension    ILD (interstitial lung disease) (HCC)    8 yrs ago   Lymphadenopathy    Past Surgical History:  Past Surgical History:  Procedure Laterality Date   BREAST BIOPSY Left 2010   neg   CATARACT EXTRACTION W/PHACO Left 10/16/2017   Procedure: CATARACT EXTRACTION PHACO AND INTRAOCULAR LENS PLACEMENT (IOC) COMPLICATED DIABETIC LEFT;  Surgeon: Annell Kidney, MD;  Location: [facility];  Service: Ophthalmology;  Laterality: Left;  IRIS HOOKS Diabetic - oral meds   COLONOSCOPY N/A 06/19/2021   Procedure: COLONOSCOPY;  Surgeon: Quintin Buckle, DO;  Location: [facility] END[facility];  Service: Gastroenterology;  Laterality: N/A;   ESOPHAGOGASTRODUODENOSCOPY N/A 06/19/2021   Procedure: ESOPHAGOGASTRODUODENOSCOPY (EGD);  Surgeon: Quintin Buckle, DO;  Location: [facility] ENDOSCOPY;  Service: Gastroenterology;  Laterality: N/A;   HERNIA REPAIR     PARTIAL HYSTERECTOMY     age 39   PERIPHERAL VASCULAR CATHETERIZATION N/A 08/22/2016   Procedure: [place] Stade Cath Insertion;  Surgeon: Jackquelyn Mass, MD;  Location: [facility] INVASIVE CV LAB;  Service: Cardiovascular;  Laterality: N/A;   TONSILLECTOMY     HPI:  Rose Hill is a 78 y.o. 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  Patient has had a several week history of weakness, poor oral intake and weight loss in the setting of her acute infections and oral thrush.  Her weakness worsened since her COVID diagnosis to where she is too weak to ambulate. As such presented to [facility] ED on 11/29/2023 for evaluation and treatment.   Chest CT on 11/30/2023 revealed 1. Extensive pneumomediastinum again noted, similar to prior. Patient was given p.o. water-soluble contrast immediately prior to scanning. There is no contrast extravasation from the esophagus to suggest esophageal leak. 2. Sequelae of Nissen fundoplication with hiatal hernia including the wrapped portion of the stomach and distal esophagus. 3. Small left apical pneumothorax seen previously has decreased in the interval. 4. Subpleural reticulation in both lungs with basilar predominant diffuse interstitial thickening suggests underlying pulmonary fibrosis.   Chrat reports limited PO intake d/t odynophagia preceeding COVID infection. Pt with mouth sores for several weeks.      Assessment / Plan / Recommendation  Clinical Impression  Pt presented with magic mouthwash on bedside table, reporting 8 out of 10 pain with liquid diet tray present. She reports consuming "some" of the Ensure with increased pain. She also appeared unaware that  the magic mouthwash was present on her table. Education provided for pt to swish and spit before consuming PO intake to help with pain control while she eat/drink. With SLP assistance, pt was able to do so and reported a decrease in  pain to 6 out of 10. During this time, she consumed several sips of Ensure without any overt s/s of aspiration or dysphagia. She declined trials of food textures. Will leave on current diet with requests for jello as pt states she doesn't like broth and is not able to "drink but just a little bit of Ensure" d/t increased running bowels. Will also reach out to RD for additional supplements. ST will sign off as pt doesn't have any s/s of oropharyngeal dysphagia.  This Clinical research associate revisited with pt to provide encouragement and education on need to increase PO intake in the setting of recent decline and hospitalization. She reported "that is what he tells me" pointing to her husband.   SLP Visit Diagnosis: Dysphagia, unspecified (R13.10)    Aspiration Risk  No limitations    Diet Recommendation Dysphagia 3 (Mech soft);Thin liquid    Liquid Administration via: Straw Medication Administration: Crushed with puree (with pudding) Supervision: Patient able to self feed Compensations: Minimize environmental distractions;Slow rate;Small sips/bites Postural Changes: Seated upright at 90 degrees    Other  Recommendations Oral Care Recommendations: Oral care BID    Recommendations for follow up therapy are one component of a multi-disciplinary discharge planning process, led by the attending physician.  Recommendations may be updated based on patient status, additional functional criteria and insurance authorization.  Follow up Recommendations No SLP follow up      Assistance Recommended at Discharge  N/A  Functional Status Assessment Patient has not had a recent decline in their functional status  Frequency and Duration   N/A         Prognosis   N/A     Swallow Study   General Date of Onset: 11/29/23 HPI: Rose Hill is a 78 y.o. 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  Patient has had a several week history of weakness, poor oral intake and weight loss in the setting of her acute infections and oral thrush.  Her weakness worsened since her COVID diagnosis to where she is too weak to ambulate. As such presented to [facility] ED on 11/29/2023 for evaluation and treatment. Chest CT on 11/30/2023 revealed 1. Extensive pneumomediastinum again noted, similar to prior. Patient was given p.o. water-soluble contrast immediately prior to scanning. There is no contrast extravasation from the esophagus to suggest esophageal leak. 2. Sequelae of Nissen fundoplication with hiatal hernia including the wrapped portion of the stomach and distal esophagus. 3. Small left apical pneumothorax seen previously has decreased in the interval. 4. Subpleural reticulation in both lungs with basilar predominant diffuse interstitial thickening suggests underlying pulmonary fibrosis. Type of Study: Bedside Swallow Evaluation Previous Swallow Assessment: none on chart Diet Prior to this Study: Dysphagia 3 (mechanical soft);Thin liquids (Level 0) Temperature Spikes Noted: No Respiratory Status: Room air History of Recent Intubation: No Behavior/Cognition: Alert;Cooperative;Pleasant mood Oral Care Completed by SLP: Recent completion by staff Oral Cavity - Dentition: Adequate natural dentition Vision:  Functional for self-feeding Self-Feeding Abilities: Able to feed self Patient Positioning: Upright in bed Baseline Vocal Quality: Normal Volitional Cough: Strong Volitional Swallow: Able to elicit    Oral/Motor/Sensory Function Overall Oral Motor/Sensory Function: Within functional limits   Ice Chips Ice chips: Not tested   Thin Liquid Thin  Liquid: Within functional limits Presentation: Straw    Nectar Thick Nectar Thick Liquid: Not tested   Honey Thick Honey Thick Liquid: Not tested   Puree Puree: Not tested Other Comments: pt declined   Solid     Solid: Not tested Other Comments: pt declined     Christne Platts B. Garlin Junker, M.S., CCC-SLP, Tree surgeon Certified Brain Injury Specialist [facility]  [facility] Office [phone] Ascom [phone] Fax [phone]

## 2023-11-30 NOTE — Progress Notes (Signed)
 PROGRESS NOTE    Rose Hill  OVF:[id number] DOB: [birth year] DOA: 11/29/2023 PCP: Sari Cunning, MD  Subjective: Pt seen and examined. Met with husband at bedside. Pt has been SOB for several weeks. Her odynophagia has been persistent for several weeks. This proceeds her covid infection. Has had mouth sores for several weeks.  Only drinks a little bit of water. Not eating any solids due to odynophagia.  Discussed with radiologist this AM. Other Causes of pt's pneumomediastinum such as esophageal perforation as not been ruled out yet. Radiologist suggested non-contrast chest CT with pt drinking oral Gastrografin immediately before scanning.   Hospital Course: HPI: Rose Hill is a 78 y.o. 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  Patient has had a several week history of weakness, poor oral intake and weight loss in the setting of her acute infections and oral thrush.  Her weakness worsened since her COVID diagnosis to where she is too weak to ambulate. .  She denies cough, fever or chills, vomiting or diarrhea.  She has decreased urinary output without dysuria. ED course and data review: Tachycardic at 109 with otherwise normal vitals CBC with leukopenia of 3.4, otherwise unremarkable BMP unremarkable Urinalysis with rare bacteria EKG, please reviewed and interpreted showing sinus tachycardia at 110 CT abdomen and pelvis with contrast showing a partially visualized pneumomediastinum.  Follow-up CT chest with contrast shows a very small left pneumothorax 5% at apex with moderate to severe diffuse  pneumomediastinum, stable pulmonary fibrosis and other nonacute findings   The ED provider spoke with radiologist to discuss findings.  Informed that pneumomediastinum possibly related to prior coughing.  No intervention recommended for 5% pneumothorax   Patient was treated with an NS bolus, started on Rocephin and azithromycin for possible pneumonia (not shown on chest CT)   Hospitalist consulted for admission.   Significant Events: Admitted 11/29/2023 for adult FTT   Significant Labs: Na 138, K 4.2, CO2 of 23, BUN 8, Scr 0.43, glu 123 UA hazy, LE neg, nitrite Neg WBC 3.4, HgB 13.7, Plt 262 Covid Positive  Significant Imaging Studies: CXR Question developing airspace opacity along the right peripheral mid lung zone. Lucency at the left apex with no definite pneumothorax. Pneumomediastinum noted on CT chest not well visualized. CT abd/pelvis Partially visualized pneumomediastinum. Recommend CT chest with intravenous contrast for further evaluation. 2.   At least small volume sliding and paraesophageal hernia. 3. Decompressed urinary bladder with limited evaluation. Given findings, correlate with urinalysis for cystitis. 4.  Aortic Atherosclerosis  CT chest Very small left pneumothorax in the apex (5%). 2. Moderate to severe diffuse pneumomediastinum. 3. Stable pulmonary fibrosis.  No focal pneumonia. 4. Moderate cardiomegaly. 5. Moderate-sized hiatal hernia. 6. Aortic atherosclerosis. Pelvic XR Limited study due to contrast material on clothing or diaper. No visible fracture, and no fracture seen on earlier CT.  Antibiotic Therapy: Anti-infectives (From admission, onward)    Start     Dose/Rate Route Frequency Ordered Stop   11/30/23 1000  fluconazole (DIFLUCAN) tablet 100 mg        100 mg Oral Daily 11/30/23 0011 12/01/23 0959   11/30/23 1000  valACYclovir (VALTREX) tablet 500 mg  500 mg Oral Daily 11/30/23 0011     11/29/23 2230  cefTRIAXone (ROCEPHIN) 2 g in sodium chloride  0.9 % 100 mL IVPB        2 g 200 mL/hr over 30 Minutes Intravenous  Once 11/29/23 2222 11/29/23 2351   11/29/23 2230  azithromycin (ZITHROMAX) 500 mg in sodium chloride 0.9 % 250 mL IVPB        500 mg 250 mL/hr over 60 Minutes Intravenous  Once 11/29/23 2222 11/30/23 0332       Procedures:   Consultants:     Assessment and Plan: * Failure to thrive (child) On admission. Frailty/physical deconditioning with ambulatory dysfunction. Poor oral intake Suspect secondary to recent acute infections including UTI, COVID, oral candidiasis as well as nutritional related to poor oral intake PT and nutritional consult Supportive care Full liquid diet for now Continue Megace  11-30-2023 FTT due to odynophagia and mouth sores. change to mechanical soft diet. Treat her oral thrush with IV diflucan, oral nystatin swish and spit.  COVID-19 virus infection On admission. Supportive care. Outside of therapeutic window of antiviral therapy. Patient is not hypoxic  11-30-2023 continue supportive care  Pneumothorax on left 11-30-2023 esophageal perforation ruled out this morning with noncontrasted chest CT after discussion with radiologist.  Patient was initially placed on prophylactic Zosyn pending her CT chest.  Since she does not have a esophageal perforation, can stop the IV Zosyn.  More likely her pneumomediastinum is from her pneumothorax.  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  Her pneumothorax has already decreased in size since yesterday.   Odynophagia 11-30-2023 patient has a prior history of Nissen fundoplication.  I think her mouth pain and odynophagia is due to candidiasis.  But will also get speech therapy to see her in case she needs a modified barium swallow or fees.  Pneumomediastinum (HCC) On admission. Small pneumothorax 5%. Per radiology/EDP conversation that possibly related to rupture of small bleb. Patient clinically stable-monitor for signs of  decompensation  11-30-2023 esophageal perforation ruled out this morning with noncontrasted chest CT after discussion with radiologist.  Patient was initially placed on prophylactic Zosyn pending her CT chest.  Since she does not have a esophageal perforation, can stop the IV Zosyn.  More likely her pneumomediastinum is from her pneumothorax.  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  Her pneumothorax has already decreased in size since yesterday.  Oral candidiasis On admission. Continue fluconazole until 4/13. Continue nystatin swish and spit. Continue viscous lidocaine as needed. Full liquid diet  11-30-2023 changed to IV Diflucan given her oral candidiasis and odynophagia.  Continue with Magic mouthwash with lidocaine swish, gargle, spit 4 times a day.  Leukopenia On admission. 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 On Venofer infusions from oncology  11-30-2023 chronic.  Recurrent UTI 11-30-2023 last urine cx grew staph epi and aerococcus. Pt given po cipro. Staph epi resistant to cipro. Will give po macrodantin. Pt cannot take doxycycline due to allergy.  Long-term current use of intravenous immunoglobulin (IVIG) On admission. Long-term current use of IVIG. Continue prophylactic Valtrex  11-30-2023 chronic. Received dose in March 2023 in heme/onc clinic.  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 Long-term current use of IVIG. Continue prophylactic Valtrex  11-30-2023 chronic.  Interstitial lung disease (HCC) 11-30-2023 chronic. Likely the cause of her pneumothorax  Diffuse large B-cell lymphoma of intra-abdominal lymph nodes (HCC) On admission. Secondary immunodeficiency .Recurrent UTIs (Klebsiella and Proteus 3/10). Oral thrush 11/18/2023 on IVIG infusion and prophylactic  Valtrex, Urinalysis unremarkable Continue oral fluconazole for 14 days (3/31-4/13/25)  11-30-2023 chronic.  She is status post CAR-T therapy  several years ago.  Followed by oncology at [facility] oncology  Essential hypertension On admission. Continue metoprolol  11-30-2023 hold orders placed on lopressor.  S/P Nissen fundoplication (without gastrostomy tube) procedure 11-30-2023 noted on EGD in 05-2021.  DVT prophylaxis: enoxaparin (LOVENOX) injection 40 mg Start: 11/30/23 0800    Code Status: Full Code Family Communication: discussed with pt and husband at bedside Disposition Plan: return home Reason for continuing need for hospitalization: on IV diflucan, ST consulted for odynophagia. Monitoring pneumomediastinum and pneumothorax. Change to inpatient status.  Objective: Vitals:   11/29/23 2330 11/30/23 0000 11/30/23 0223 11/30/23 0910  BP: 118/61 (!) 122/55  (!) 133/54  Pulse: 92 81  86  Resp: 19 19  18   Temp:   98.5 F (36.9 C) 98.1 F (36.7 C)  TempSrc:   Oral Oral  SpO2: 99% 100%  99%  Weight:      Height:        Intake/Output Summary (Last 24 hours) at 11/30/2023 0959 Last data filed at 11/29/2023 2351 Gross per 24 hour  Intake 100 ml  Output --  Net 100 ml   Filed Weights   11/29/23 1919  Weight: 54.4 kg   Examination:  Physical Exam Vitals and nursing note reviewed.  Constitutional:      General: She is not in acute distress.    Appearance: She is not toxic-appearing or diaphoretic.     Comments: Chronically ill appearing, cachetic female  HENT:     Head: Normocephalic and atraumatic.     Nose: Nose normal.  Eyes:     General: No scleral icterus. Cardiovascular:     Rate and Rhythm: Normal rate and regular rhythm.  Pulmonary:     Effort: Pulmonary effort is normal.     Breath sounds: Normal breath sounds.     Comments: Dry rales at bases No SQ crepitus/emphysema Abdominal:     General: Bowel sounds are normal. There is no distension.     Palpations: Abdomen is soft.     Tenderness: There is no abdominal tenderness.  Musculoskeletal:     Right lower leg: No edema.     Left lower leg: No  edema.     Comments: Right ant port-a-cath  Skin:    General: Skin is warm and dry.     Capillary Refill: Capillary refill takes less than 2 seconds.  Neurological:     General: No focal deficit present.     Mental Status: She is alert and oriented to person, place, and time.    Data Reviewed: I have personally reviewed following labs and imaging studies  CBC: Recent Labs  Lab 11/29/23 1932  WBC 3.4*  NEUTROABS 2.0  HGB 13.7  HCT 41.7  MCV 93.3  PLT 262   Basic Metabolic Panel: Recent Labs  Lab 11/29/23 1932  NA 138  K 4.2  CL 102  CO2 23  GLUCOSE 123*  BUN 8  CREATININE 0.43*  CALCIUM 9.6   GFR: Estimated Creatinine Clearance: 51.4 mL/min (A) (by C-G formula based on SCr of 0.43 mg/dL (L)).  Recent Results (from the past 240 hours)  Resp panel by RT-PCR (RSV, Flu A&B, Covid) Anterior Nasal Swab     Status: Abnormal   Collection Time: 11/21/23 12:51 PM   Specimen: Anterior Nasal Swab  Result Value Ref Range Status   SARS Coronavirus 2 by RT PCR  POSITIVE (A) NEGATIVE Final    Comment: (NOTE) SARS-CoV-2 target nucleic acids are DETECTED.  The SARS-CoV-2 RNA is generally detectable in upper respiratory specimens during the acute phase of infection. Positive results are indicative of the presence of the identified virus, but do not rule out bacterial infection or co-infection with other pathogens not detected by the test. Clinical correlation with patient history and other diagnostic information is necessary to determine patient infection status. The expected result is Negative.  Fact Sheet for Patients: BloggerCourse.com  Fact Sheet for Healthcare Providers: SeriousBroker.it  This test is not yet approved or cleared by the [place]  FDA and  has been authorized for detection and/or diagnosis of SARS-CoV-2 by FDA under an Emergency Use Authorization (EUA).  This EUA will remain in effect (meaning this  test can be used) for the duration of  the COVID-19 declaration under Section 564(b)(1) of the A ct, 21 U.S.C. section 360bbb-3(b)(1), unless the authorization is terminated or revoked sooner.     Influenza A by PCR NEGATIVE NEGATIVE Final   Influenza B by PCR NEGATIVE NEGATIVE Final    Comment: (NOTE) The Xpert Xpress SARS-CoV-2/FLU/RSV plus assay is intended as an aid in the diagnosis of influenza from Nasopharyngeal swab specimens and should not be used as a sole basis for treatment. Nasal washings and aspirates are unacceptable for Xpert Xpress SARS-CoV-2/FLU/RSV testing.  Fact Sheet for Patients: BloggerCourse.com  Fact Sheet for Healthcare Providers: SeriousBroker.it  This test is not yet approved or cleared by the [place]  FDA and has been authorized for detection and/or diagnosis of SARS-CoV-2 by FDA under an Emergency Use Authorization (EUA). This EUA will remain in effect (meaning this test can be used) for the duration of the COVID-19 declaration under Section 564(b)(1) of the Act, 21 U.S.C. section 360bbb-3(b)(1), unless the authorization is terminated or revoked.     Resp Syncytial Virus by PCR NEGATIVE NEGATIVE Final    Comment: (NOTE) Fact Sheet for Patients: BloggerCourse.com  Fact Sheet for Healthcare Providers: SeriousBroker.it  This test is not yet approved or cleared by the [place]  FDA and has been authorized for detection and/or diagnosis of SARS-CoV-2 by FDA under an Emergency Use Authorization (EUA). This EUA will remain in effect (meaning this test can be used) for the duration of the COVID-19 declaration under Section 564(b)(1) of the Act, 21 U.S.C. section 360bbb-3(b)(1), unless the authorization is terminated or revoked.  Performed at [facility], [address], [place], [place] [address]      Radiology Studies: CT  CHEST WO CONTRAST Result Date: 11/30/2023 CLINICAL DATA:  Pneumomediastinum. Evaluate for esophageal perforation. EXAM: CT CHEST WITHOUT CONTRAST TECHNIQUE: Multidetector CT imaging of the chest was performed following the standard protocol without IV contrast. RADIATION DOSE REDUCTION: This exam was performed according to the departmental dose-optimization program which includes automated exposure control, adjustment of the mA and/or kV according to patient size and/or use of iterative reconstruction technique. COMPARISON:  Chest CT 11/29/2023. FINDINGS: Cardiovascular: The heart size is normal. No substantial pericardial effusion. Coronary artery calcification is evident. Mild atherosclerotic calcification is noted in the wall of the thoracic aorta. Right Port-A-Cath tip is positioned in the mid SVC. Mediastinum/Nodes: No mediastinal lymphadenopathy. No evidence for gross hilar lymphadenopathy although assessment is limited by the lack of intravenous contrast on the current study. Extensive pneumomediastinum again noted, similar to prior. Patient was given p.o. water-soluble contrast immediately prior to scanning. There is no contrast extravasation from the esophagus to suggest esophageal leak. Sequelae  of Nissen fundoplication evident with hiatal hernia including the wrapped portion of the stomach and distal esophagus. No fluid within the mediastinum. There is no axillary lymphadenopathy. Lungs/Pleura: The small left apical pneumothorax seen previously has decreased in the interval. Subpleural reticulation in both lungs with basilar predominant diffuse interstitial thickening suggests underlying pulmonary fibrosis. No dense consolidative airspace disease. No pleural effusion. Upper Abdomen: No acute findings within the visualized upper abdomen. Musculoskeletal: No worrisome lytic or sclerotic osseous abnormality. IMPRESSION: 1. Extensive pneumomediastinum again noted, similar to prior. Patient was given p.o.  water-soluble contrast immediately prior to scanning. There is no contrast extravasation from the esophagus to suggest esophageal leak. 2. Sequelae of Nissen fundoplication with hiatal hernia including the wrapped portion of the stomach and distal esophagus. 3. Small left apical pneumothorax seen previously has decreased in the interval. 4. Subpleural reticulation in both lungs with basilar predominant diffuse interstitial thickening suggests underlying pulmonary fibrosis. 5.  Aortic Atherosclerois (ICD10-170.0) Electronically Signed   By: Donnal Fusi M.D.   On: 11/30/2023 09:06   DG HIP UNILAT WITH PELVIS 2-3 VIEWS RIGHT Result Date: 11/30/2023 CLINICAL DATA:  Fall, right hip pain EXAM: DG HIP (WITH OR WITHOUT PELVIS) 2-3V RIGHT COMPARISON:  None Available. FINDINGS: Contrast material noted within the urinary bladder from earlier contrasted study. Contrast is now at external to the patient, possibly within clothing or diaper. This causes artifact over the bony structures of the lower pelvis, limiting study. No visible fracture, and no fracture seen on earlier CT. No subluxation or dislocation. IMPRESSION: Limited study due to contrast material on clothing or diaper. No visible fracture, and no fracture seen on earlier CT. Electronically Signed   By: Janeece Mechanic M.D.   On: 11/30/2023 02:17   CT Chest W Contrast Result Date: 11/29/2023 CLINICAL DATA:  Recent COVID with possible pneumonia. EXAM: CT CHEST WITH CONTRAST TECHNIQUE: Multidetector CT imaging of the chest was performed during intravenous contrast administration. RADIATION DOSE REDUCTION: This exam was performed according to the departmental dose-optimization program which includes automated exposure control, adjustment of the mA and/or kV according to patient size and/or use of iterative reconstruction technique. CONTRAST:  75mL OMNIPAQUE IOHEXOL 300 MG/ML  SOLN COMPARISON:  Chest x-ray 11/29/2023.  Head CT 12/11/2022. FINDINGS: Cardiovascular: The  heart is moderately enlarged. There is no pericardial effusion. Aorta is normal in size. There are atherosclerotic calcifications of the aorta. Right chest port catheter tip ends in the mid SVC. Mediastinum/Nodes: There is moderate severe diffuse pneumomediastinum. Visualized thyroid gland is within normal limits. No enlarged lymph nodes are identified. There is a moderate-sized hiatal hernia similar to prior. Lungs/Pleura: Peripheral fibrotic changes are seen throughout both lungs in the lower lobe predominance. There is no focal pneumonia or pleural effusion. There is a very small left pneumothorax in the apex (5%). Trachea and central airways are patent. Upper Abdomen: There is an 11 mm hypodensity in the spleen, possibly a complex cyst or hemangioma. This is unchanged from prior. Musculoskeletal: No chest wall abnormality. No acute or significant osseous findings. IMPRESSION: 1. Very small left pneumothorax in the apex (5%). 2. Moderate to severe diffuse pneumomediastinum. 3. Stable pulmonary fibrosis.  No focal pneumonia. 4. Moderate cardiomegaly. 5. Moderate-sized hiatal hernia. 6. Aortic atherosclerosis. Electronically Signed   By: Tyron Gallon M.D.   On: 11/29/2023 23:30   DG Chest Portable 1 View Addendum Date: 11/29/2023 ADDENDUM REPORT: 11/29/2023 22:29 ADDENDUM: Lucency at the left apex with no definite pneumothorax. Pneumomediastinum noted on CT chest not well  visualized. Electronically Signed   By: Morgane  Naveau M.D.   On: 11/29/2023 22:29   Result Date: 11/29/2023 CLINICAL DATA:  Shortness of breath, COVID diagnosis, evaluating for secondary pneumonia EXAM: PORTABLE CHEST 1 VIEW COMPARISON:  Chest x-ray 11/19/2023 FINDINGS: Right chest wall dialysis catheter with tip overlying the expected region of the distal superior vena cava. The heart and mediastinal contours are within normal limits. Question developing airspace opacity along the right peripheral mid lung zone. Right lung surgical  pulmonary sutures. Chronic coarsened interstitial markings with no overt pulmonary edema. No pleural effusion. No pneumothorax. No acute osseous abnormality. IMPRESSION: Question developing airspace opacity along the right peripheral mid lung zone. Electronically Signed: By: Morgane  Naveau M.D. On: 11/29/2023 22:12   CT ABDOMEN PELVIS W CONTRAST Addendum Date: 11/29/2023 ADDENDUM REPORT: 11/29/2023 22:29 ADDENDUM: These results were called by telephone at the time of interpretation on 11/29/2023 at 10:29 pm to provider DAVID WELLS , who verbally acknowledged these results. Electronically Signed   By: Morgane  Naveau M.D.   On: 11/29/2023 22:29   Result Date: 11/29/2023 CLINICAL DATA:  [place] right lower quadrant pain associated with nausea and dehydration. Evaluate for appendicitis or other acute intra-abdominal infection EXAM: CT ABDOMEN AND PELVIS WITH CONTRAST TECHNIQUE: Multidetector CT imaging of the abdomen and pelvis was performed using the standard protocol following bolus administration of intravenous contrast. RADIATION DOSE REDUCTION: This exam was performed according to the departmental dose-optimization program which includes automated exposure control, adjustment of the mA and/or kV according to patient size and/or use of iterative reconstruction technique. CONTRAST:  100mL OMNIPAQUE IOHEXOL 300 MG/ML  SOLN COMPARISON:  None Available. FINDINGS: Lower chest: Emphysematous changes. Pneumomediastinum. At least small volume sliding and paraesophageal hernia. Hepatobiliary: No focal liver abnormality. No gallstones, gallbladder wall thickening, or pericholecystic fluid. No biliary dilatation. Pancreas: Diffusely atrophic. No focal lesion. Otherwise normal pancreatic contour. No surrounding inflammatory changes. No main pancreatic ductal dilatation. Spleen: Normal in size.  Fluid density lesions likely cysts. Adrenals/Urinary Tract: No adrenal nodule bilaterally. Bilateral kidneys enhance symmetrically.  Atrophy of the left inferior renal pole. No hydronephrosis. No hydroureter. The urinary bladder is decompressed with urinary bladder wall thickening and mucosal hyperemia. Foci of gas within the urinary bladder lumen. On delayed imaging, there is no urothelial wall thickening and there are no filling defects in the opacified portions of the bilateral collecting systems or ureters. Stomach/Bowel: Stomach is within normal limits. No evidence of bowel wall thickening or dilatation. Appendix appears normal. Vascular/Lymphatic: No abdominal aorta or iliac aneurysm. Moderate atherosclerotic plaque of the aorta and its branches. No abdominal, pelvic, or inguinal lymphadenopathy. Reproductive: Status post hysterectomy. No adnexal masses. Other: No intraperitoneal free fluid. No intraperitoneal free gas. No organized fluid collection. Musculoskeletal: No abdominal wall hernia or abnormality. No suspicious lytic or blastic osseous lesions. No acute displaced fracture. IMPRESSION: 1. Partially visualized pneumomediastinum. Recommend CT chest with intravenous contrast for further evaluation. 2.   At least small volume sliding and paraesophageal hernia. 3. Decompressed urinary bladder with limited evaluation. Given findings, correlate with urinalysis for cystitis. 4.  Aortic Atherosclerosis (ICD10-I70.0). Electronically Signed: By: Morgane  Naveau M.D. On: 11/29/2023 22:19    Scheduled Meds:  enoxaparin (LOVENOX) injection  40 mg Subcutaneous Q24H   magic mouthwash w/lidocaine  15 mL Oral QID   megestrol  20 mg Oral Daily   metoprolol tartrate  12.5 mg Oral BID   nitrofurantoin (macrocrystal-monohydrate)  100 mg Oral Q12H   valACYclovir  500 mg Oral Daily  Continuous Infusions:  fluconazole (DIFLUCAN) IV 200 mg (11/30/23 0953)     LOS: 0 days   Time spent: 50 minutes  Unk Garb, DO  [facility]  11/30/2023, 9:59 AM

## 2023-11-30 NOTE — H&P (Signed)
 History and Physical    Patient: Rose Hill DOB: Sep 18, 1945 DOA: 11/29/2023 DOS: the patient was seen and examined on 11/30/2023 PCP: Sari Cunning, MD  Patient coming from: Home  Chief Complaint:  Chief Complaint  Patient presents with   Urinary Frequency    HPI: Rose Hill is a 78 y.o. 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  Patient has had a several week history of weakness, poor oral intake and weight loss in the setting of her acute infections and oral thrush.  Her weakness worsened since her COVID diagnosis to where she is too weak to ambulate. .  She denies cough, fever or chills, vomiting or diarrhea.  She has decreased urinary output without dysuria. ED course and data review: Tachycardic at 109 with otherwise normal vitals CBC with leukopenia of 3.4, otherwise unremarkable BMP unremarkable Urinalysis with rare bacteria EKG, please reviewed and interpreted showing sinus tachycardia at 110 CT abdomen and pelvis with contrast showing a partially visualized pneumomediastinum.  Follow-up CT chest with contrast shows a very small left pneumothorax 5% at apex with moderate to severe diffuse pneumomediastinum, stable pulmonary fibrosis and other nonacute findings  The ED provider spoke with radiologist to discuss findings.  Informed that pneumomediastinum possibly related to prior coughing.  No intervention recommended for 5% pneumothorax  Patient was treated with an NS bolus, started on Rocephin and azithromycin for possible pneumonia (not shown on chest CT)  Hospitalist consulted  for admission.   Review of Systems: As mentioned in the history of present illness. All other systems reviewed and are negative.  Past Medical History:  Diagnosis Date   Diabetes mellitus without complication (HCC)    Diffuse large B-cell lymphoma (HCC)    Chemo + rad tx's   Dysplastic nevus 03/14/2020   left inf med scapula moderate atypia    Dysplastic nevus 03/14/2020   left lat breast parallel to areaola, moderate atypia    Heart murmur    History of chemotherapy 07/08/2017   History of gastric ulcer    History of radiation therapy 07/08/2017   Hypercholesterolemia    Hypertension    ILD (interstitial lung disease) (HCC)    8 yrs ago   Lymphadenopathy    Past Surgical History:  Procedure Laterality Date   BREAST BIOPSY Left 2010   neg   CATARACT EXTRACTION W/PHACO Left 10/16/2017   Procedure: CATARACT EXTRACTION PHACO AND INTRAOCULAR LENS PLACEMENT (IOC) COMPLICATED DIABETIC LEFT;  Surgeon: Annell Kidney, MD;  Location: [facility];  Service: Ophthalmology;  Laterality: Left;  IRIS HOOKS Diabetic - oral meds   COLONOSCOPY N/A 06/19/2021   Procedure: COLONOSCOPY;  Surgeon: Quintin Buckle, DO;  Location: [facility] END[facility];  Service: Gastroenterology;  Laterality: N/A;   ESOPHAGOGASTRODUODENOSCOPY N/A 06/19/2021   Procedure: ESOPHAGOGASTRODUODENOSCOPY (EGD);  Surgeon: Quintin Buckle, DO;  Location[facility] END[facility];  Service: Gastroenterology;  Laterality: N/A;   HERNIA REPAIR     PARTIAL HYSTERECTOMY     age 5   PERIPHERAL VASCULAR CATHETERIZATION N/A 08/22/2016   Procedure: Melville Stade Cath Insertion;  Surgeon: Jackquelyn Mass, MD;  Location: [facility] INVASIVE CV LAB;  Service: Cardiovascular;  Laterality: N/A;   TONSILLECTOMY  Social History:  reports that she quit smoking about 65 years ago. Her smoking use included cigarettes. She has never used smokeless tobacco. She reports that she does not drink alcohol and does not use drugs.  Allergies   Allergen Reactions   Levaquin [Levofloxacin In D5w] Palpitations and Other (See Comments)    Burning in lower legs   Cefuroxime Axetil Diarrhea    Other reaction(s): Abdominal Pain   Doxycycline Other (See Comments)    Other reaction(s): Abdominal Pain severe    Family History  Problem Relation Age of Onset   Heart disease Mother    Heart disease Father    Heart disease Brother     Prior to Admission medications   Medication Sig Start Date End Date Taking? Authorizing Provider  acetaminophen (TYLENOL) 325 MG tablet Take by mouth.    [provider]  b complex vitamins tablet Take 1 tablet by mouth daily.    [provider]  buPROPion (WELLBUTRIN XL) 150 MG 24 hr tablet Take 150 mg by mouth at bedtime. 12/18/22 12/18/23  [provider]  ciprofloxacin (CIPRO) 500 MG tablet Take 1 tablet (500 mg total) by mouth 2 (two) times daily. 11/18/23   Borders, Joshua R, NP  dexamethasone (DECADRON) 4 MG tablet Take 1 tablet (4 mg total) by mouth 2 (two) times daily. For 1 day; prior to infusion. 02/21/22   Brahmanday, Govinda R, MD  fluconazole (DIFLUCAN) 100 MG tablet Take 1 tablet (100 mg total) by mouth daily. 11/18/23   Borders, Carlene Che, NP  gabapentin (NEURONTIN) 300 MG capsule Take 1 capsule (300 mg total) by mouth 2 (two) times daily. Patient taking differently: Take 300 mg by mouth daily. 05/14/19   Brahmanday, Govinda R, MD  ibuprofen (ADVIL) 200 MG tablet Take by mouth.    [provider]  latanoprost (XALATAN) 0.005 % ophthalmic solution Place 1 drop into both eyes at bedtime.  01/21/16   [provider]  lidocaine (XYLOCAINE) 2 % solution Use as directed 15 mLs in the mouth or throat as needed for mouth pain. 11/21/23   Arline Bennett, MD  megestrol (MEGACE) 20 MG tablet Take 20 mg by mouth daily. 11/08/23 11/07/24  [provider]  metoCLOPramide (REGLAN) 5 MG tablet Take 5 mg by mouth in the morning and at bedtime. 11/08/23   [provider]  metoprolol tartrate (LOPRESSOR) 25 MG tablet TAKE 1 TABLET BY MOUTH TWICE DAILY Patient taking differently: 12.5 mg 2 (two) times daily. (BETA BLOCKER) 08/25/18   Brahmanday, Govinda R, MD  nitrofurantoin, macrocrystal-monohydrate, (MACROBID) 100 MG capsule Take 1 capsule (100 mg total) by mouth 2 (two) times daily. 11/25/23   Borders, Carlene Che, NP  nystatin (MYCOSTATIN) 100000 UNIT/ML suspension Take 5 mLs (500,000 Units total) by mouth 4 (four) times daily. 11/25/23   Borders, Carlene Che, NP  ondansetron (ZOFRAN) 8 MG tablet Take by mouth. Patient not taking: Reported on 11/18/2023 12/08/19   [provider]  pantoprazole (PROTONIX) 40 MG tablet Take 1 tablet by mouth in the morning and at bedtime. 11/06/19   [provider]  potassium chloride SA (KLOR-CON M) 20 MEQ tablet 1 pill twice a day x 1 week; and then one a day. 10/28/23   Brahmanday, Govinda R, MD  prochlorperazine (COMPAZINE) 10 MG tablet Take 10 mg by mouth every 6 (six) hours as needed for nausea or vomiting. Patient not taking: Reported on 11/18/2023    [provider]  rosuvastatin (CRESTOR) 10 MG tablet  Take 1 tablet by mouth daily. 06/25/23 06/24/24  [provider]  valACYclovir (VALTREX) 500 MG tablet Take 1 tablet by mouth once daily 11/06/23   Gwyn Leos, MD    Physical Exam: Vitals:   11/29/23 1945 11/29/23 2000 11/29/23 2030 11/29/23 2056  BP:  (!) 141/73 129/64   Pulse: (!) 109 (!) 123 (!) 109   Resp: (!) 21 (!) 21 (!) 22   Temp:      TempSrc:      SpO2: 98% 91% 98%   Weight:      Height:    5\' 4"  (1.626 m)   Physical Exam Vitals and nursing note reviewed.  Constitutional:      General: She is not in acute distress.    Comments: Frail-appearing elderly female, husband at bedside  HENT:     Head: Normocephalic and atraumatic.  Cardiovascular:     Rate and Rhythm: Regular rhythm. Tachycardia present.     Heart sounds: Normal heart sounds.  Pulmonary:      Effort: Tachypnea present.     Breath sounds: Normal breath sounds.  Abdominal:     Palpations: Abdomen is soft.     Tenderness: There is no abdominal tenderness.  Neurological:     Mental Status: Mental status is at baseline.     Labs on Admission: I have personally reviewed following labs and imaging studies  CBC: Recent Labs  Lab 11/29/23 1932  WBC 3.4*  NEUTROABS 2.0  HGB 13.7  HCT 41.7  MCV 93.3  PLT 262   Basic Metabolic Panel: Recent Labs  Lab 11/29/23 1932  NA 138  K 4.2  CL 102  CO2 23  GLUCOSE 123*  BUN 8  CREATININE 0.43*  CALCIUM 9.6   GFR: Estimated Creatinine Clearance: 51.4 mL/min (A) (by C-G formula based on SCr of 0.43 mg/dL (L)). Liver Function Tests: No results for input(s): "AST", "ALT", "ALKPHOS", "BILITOT", "PROT", "ALBUMIN" in the last 168 hours. No results for input(s): "LIPASE", "AMYLASE" in the last 168 hours. No results for input(s): "AMMONIA" in the last 168 hours. Coagulation Profile: No results for input(s): "INR", "PROTIME" in the last 168 hours. Cardiac Enzymes: No results for input(s): "CKTOTAL", "CKMB", "CKMBINDEX", "TROPONINI" in the last 168 hours. BNP (last 3 results) No results for input(s): "PROBNP" in the last 8760 hours. HbA1C: No results for input(s): "HGBA1C" in the last 72 hours. CBG: No results for input(s): "GLUCAP" in the last 168 hours. Lipid Profile: No results for input(s): "CHOL", "HDL", "LDLCALC", "TRIG", "CHOLHDL", "LDLDIRECT" in the last 72 hours. Thyroid Function Tests: No results for input(s): "TSH", "T4TOTAL", "FREET4", "T3FREE", "THYROIDAB" in the last 72 hours. Anemia Panel: No results for input(s): "VITAMINB12", "FOLATE", "FERRITIN", "TIBC", "IRON", "RETICCTPCT" in the last 72 hours. Urine analysis:    Component Value Date/Time   COLORURINE AMBER (A) 11/29/2023 2135   APPEARANCEUR HAZY (A) 11/29/2023 2135   LABSPEC 1.021 11/29/2023 2135   PHURINE 5.0 11/29/2023 2135   GLUCOSEU NEGATIVE  11/29/2023 2135   HGBUR SMALL (A) 11/29/2023 2135   BILIRUBINUR NEGATIVE 11/29/2023 2135   KETONESUR 20 (A) 11/29/2023 2135   PROTEINUR 30 (A) 11/29/2023 2135   NITRITE NEGATIVE 11/29/2023 2135   LEUKOCYTESUR NEGATIVE 11/29/2023 2135    Radiological Exams on Admission: CT Chest W Contrast Result Date: 11/29/2023 CLINICAL DATA:  Recent COVID with possible pneumonia. EXAM: CT CHEST WITH CONTRAST TECHNIQUE: Multidetector CT imaging of the chest was performed during intravenous contrast administration. RADIATION DOSE REDUCTION: This exam was performed  according to the departmental dose-optimization program which includes automated exposure control, adjustment of the mA and/or kV according to patient size and/or use of iterative reconstruction technique. CONTRAST:  75mL OMNIPAQUE IOHEXOL 300 MG/ML  SOLN COMPARISON:  Chest x-ray 11/29/2023.  Head CT 12/11/2022. FINDINGS: Cardiovascular: The heart is moderately enlarged. There is no pericardial effusion. Aorta is normal in size. There are atherosclerotic calcifications of the aorta. Right chest port catheter tip ends in the mid SVC. Mediastinum/Nodes: There is moderate severe diffuse pneumomediastinum. Visualized thyroid gland is within normal limits. No enlarged lymph nodes are identified. There is a moderate-sized hiatal hernia similar to prior. Lungs/Pleura: Peripheral fibrotic changes are seen throughout both lungs in the lower lobe predominance. There is no focal pneumonia or pleural effusion. There is a very small left pneumothorax in the apex (5%). Trachea and central airways are patent. Upper Abdomen: There is an 11 mm hypodensity in the spleen, possibly a complex cyst or hemangioma. This is unchanged from prior. Musculoskeletal: No chest wall abnormality. No acute or significant osseous findings. IMPRESSION: 1. Very small left pneumothorax in the apex (5%). 2. Moderate to severe diffuse pneumomediastinum. 3. Stable pulmonary fibrosis.  No focal  pneumonia. 4. Moderate cardiomegaly. 5. Moderate-sized hiatal hernia. 6. Aortic atherosclerosis. Electronically Signed   By: Tyron Gallon M.D.   On: 11/29/2023 23:30   DG Chest Portable 1 View Addendum Date: 11/29/2023 ADDENDUM REPORT: 11/29/2023 22:29 ADDENDUM: Lucency at the left apex with no definite pneumothorax. Pneumomediastinum noted on CT chest not well visualized. Electronically Signed   By: Morgane  Naveau M.D.   On: 11/29/2023 22:29   Result Date: 11/29/2023 CLINICAL DATA:  Shortness of breath, COVID diagnosis, evaluating for secondary pneumonia EXAM: PORTABLE CHEST 1 VIEW COMPARISON:  Chest x-ray 11/19/2023 FINDINGS: Right chest wall dialysis catheter with tip overlying the expected region of the distal superior vena cava. The heart and mediastinal contours are within normal limits. Question developing airspace opacity along the right peripheral mid lung zone. Right lung surgical pulmonary sutures. Chronic coarsened interstitial markings with no overt pulmonary edema. No pleural effusion. No pneumothorax. No acute osseous abnormality. IMPRESSION: Question developing airspace opacity along the right peripheral mid lung zone. Electronically Signed: By: Morgane  Naveau M.D. On: 11/29/2023 22:12   CT ABDOMEN PELVIS W CONTRAST Addendum Date: 11/29/2023 ADDENDUM REPORT: 11/29/2023 22:29 ADDENDUM: These results were called by telephone at the time of interpretation on 11/29/2023 at 10:29 pm to provider DAVID WELLS , who verbally acknowledged these results. Electronically Signed   By: Morgane  Naveau M.D.   On: 11/29/2023 22:29   Result Date: 11/29/2023 CLINICAL DATA:  [place] right lower quadrant pain associated with nausea and dehydration. Evaluate for appendicitis or other acute intra-abdominal infection EXAM: CT ABDOMEN AND PELVIS WITH CONTRAST TECHNIQUE: Multidetector CT imaging of the abdomen and pelvis was performed using the standard protocol following bolus administration of intravenous  contrast. RADIATION DOSE REDUCTION: This exam was performed according to the departmental dose-optimization program which includes automated exposure control, adjustment of the mA and/or kV according to patient size and/or use of iterative reconstruction technique. CONTRAST:  100mL OMNIPAQUE IOHEXOL 300 MG/ML  SOLN COMPARISON:  None Available. FINDINGS: Lower chest: Emphysematous changes. Pneumomediastinum. At least small volume sliding and paraesophageal hernia. Hepatobiliary: No focal liver abnormality. No gallstones, gallbladder wall thickening, or pericholecystic fluid. No biliary dilatation. Pancreas: Diffusely atrophic. No focal lesion. Otherwise normal pancreatic contour. No surrounding inflammatory changes. No main pancreatic ductal dilatation. Spleen: Normal in size.  Fluid density lesions likely  cysts. Adrenals/Urinary Tract: No adrenal nodule bilaterally. Bilateral kidneys enhance symmetrically. Atrophy of the left inferior renal pole. No hydronephrosis. No hydroureter. The urinary bladder is decompressed with urinary bladder wall thickening and mucosal hyperemia. Foci of gas within the urinary bladder lumen. On delayed imaging, there is no urothelial wall thickening and there are no filling defects in the opacified portions of the bilateral collecting systems or ureters. Stomach/Bowel: Stomach is within normal limits. No evidence of bowel wall thickening or dilatation. Appendix appears normal. Vascular/Lymphatic: No abdominal aorta or iliac aneurysm. Moderate atherosclerotic plaque of the aorta and its branches. No abdominal, pelvic, or inguinal lymphadenopathy. Reproductive: Status post hysterectomy. No adnexal masses. Other: No intraperitoneal free fluid. No intraperitoneal free gas. No organized fluid collection. Musculoskeletal: No abdominal wall hernia or abnormality. No suspicious lytic or blastic osseous lesions. No acute displaced fracture. IMPRESSION: 1. Partially visualized pneumomediastinum.  Recommend CT chest with intravenous contrast for further evaluation. 2.   At least small volume sliding and paraesophageal hernia. 3. Decompressed urinary bladder with limited evaluation. Given findings, correlate with urinalysis for cystitis. 4.  Aortic Atherosclerosis (ICD10-I70.0). Electronically Signed: By: Morgane  Naveau M.D. On: 11/29/2023 22:19     Data Reviewed: Relevant notes from primary care and specialist visits, past discharge summaries as available in EHR, including Care Everywhere. Prior diagnostic testing as pertinent to current admission diagnoses Updated medications and problem lists for reconciliation ED course, including vitals, labs, imaging, treatment and response to treatment Triage notes, nursing and pharmacy notes and ED provider's notes Notable results as noted in HPI   Assessment and Plan: * Failure to thrive (child) Frailty/physical deconditioning with ambulatory dysfunction Suspect secondary to recent acute infections including UTI, COVID, oral candidiasis as well as nutritional related to poor oral intake PT and nutritional consult Supportive care Full liquid diet for now  Pneumomediastinum (HCC) Small pneumothorax 5% Per radiology/EDP conversation that possibly related to rupture of small bleb Patient clinically stable-monitor for signs of decompensation  COVID-19 virus infection Supportive care Outside of therapeutic window of antiviral therapy Patient is not hypoxic or  Diffuse large B-cell lymphoma of intra-abdominal lymph nodes (HCC) Secondary immunodeficiency Recurrent UTIs (Klebsiella and Proteus 3/10) Oral thrush 11/18/2023 on IVIG infusion and prophylactic Valtrex, Urinalysis unremarkable Continue oral fluconazole for 14 days (3/31-4/13/25)   Leukopenia Mild Leucopenia/ intremittent neutropenia/lymphopenia -chronic On Venofer infusions from oncology  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 Observation status is judged to be reasonable and necessary in order to provide the required intensity of service to ensure the patient's safety. The patient's presenting symptoms, physical exam findings, and initial radiographic and laboratory data in the context of their medical condition is felt to place them at decreased risk for further clinical deterioration. Furthermore, it is anticipated that the patient will be medically stable for discharge from the hospital within 2 midnights of admission.   Author: Lanetta Pion, MD 11/30/2023 12:05 AM  For on call review [email or web address].

## 2023-11-30 NOTE — Subjective & Objective (Signed)
 Pt seen and examined. Met with husband at bedside. Pt has been SOB for several weeks. Her odynophagia has been persistent for several weeks. This proceeds her covid infection. Has had mouth sores for several weeks.  Only drinks a little bit of water. Not eating any solids due to odynophagia.  Discussed with radiologist this AM. Other Causes of pt's pneumomediastinum such as esophageal perforation as not been ruled out yet. Radiologist suggested non-contrast chest CT with pt drinking oral Gastrografin immediately before scanning.

## 2023-11-30 NOTE — ED Notes (Signed)
 Report provided to cpod RN pt remains aox4 20g PIV to rac remains  cdi patent secured not infiltrated @ this time

## 2023-11-30 NOTE — Assessment & Plan Note (Addendum)
 On admission. Continue fluconazole until 4/13. Continue nystatin swish and spit. Continue viscous lidocaine as needed. Full liquid diet  11-30-2023 changed to IV Diflucan given her oral candidiasis and odynophagia.  Continue with Magic mouthwash with lidocaine swish, gargle, spit 4 times a day.

## 2023-11-30 NOTE — Assessment & Plan Note (Signed)
 11-30-2023 patient has a prior history of Nissen fundoplication.  I think her mouth pain and odynophagia is due to candidiasis.  But will also get speech therapy to see her in case she needs a modified barium swallow or fees.

## 2023-11-30 NOTE — Assessment & Plan Note (Addendum)
 On admission. 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 On Venofer infusions from oncology  11-30-2023 chronic.

## 2023-11-30 NOTE — Assessment & Plan Note (Addendum)
 On admission. Continue metoprolol  11-30-2023 hold orders placed on lopressor.

## 2023-11-30 NOTE — Assessment & Plan Note (Signed)
 11-30-2023 noted on EGD in 05-2021.

## 2023-11-30 NOTE — Assessment & Plan Note (Signed)
 11-30-2023 last urine cx grew staph epi and aerococcus. Pt given po cipro. Staph epi resistant to cipro. Will give po macrodantin. Pt cannot take doxycycline due to allergy.

## 2023-11-30 NOTE — Assessment & Plan Note (Signed)
 11-30-2023 esophageal perforation ruled out this morning with noncontrasted chest CT after discussion with radiologist.  Patient was initially placed on prophylactic Zosyn pending her CT chest.  Since she does not have a esophageal perforation, can stop the IV Zosyn.  More likely her pneumomediastinum is from her pneumothorax.  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  Her pneumothorax has already decreased in size since yesterday.

## 2023-11-30 NOTE — Hospital Course (Signed)
 HPI: Rose Hill is a 78 y.o. 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  Patient has had a several week history of weakness, poor oral intake and weight loss in the setting of her acute infections and oral thrush.  Her weakness worsened since her COVID diagnosis to where she is too weak to ambulate. .  She denies cough, fever or chills, vomiting or diarrhea.  She has decreased urinary output without dysuria. ED course and data review: Tachycardic at 109 with otherwise normal vitals CBC with leukopenia of 3.4, otherwise unremarkable BMP unremarkable Urinalysis with rare bacteria EKG, please reviewed and interpreted showing sinus tachycardia at 110 CT abdomen and pelvis with contrast showing a partially visualized pneumomediastinum.  Follow-up CT chest with contrast shows a very small left pneumothorax 5% at apex with moderate to severe diffuse pneumomediastinum, stable pulmonary fibrosis and other nonacute findings   The ED provider spoke with radiologist to discuss findings.  Informed that pneumomediastinum possibly related to prior coughing.  No intervention recommended for 5% pneumothorax   Patient was treated with an NS bolus, started on Rocephin and azithromycin for possible pneumonia (not shown on chest CT)   Hospitalist consulted for admission.   Significant Events: Admitted 11/29/2023 for adult FTT   Significant Labs: Na 138, K 4.2, CO2 of 23, BUN 8, Scr 0.43, glu 123 UA hazy, LE neg, nitrite Neg WBC 3.4, HgB 13.7, Plt 262 Covid Positive  Significant Imaging Studies: CXR Question developing airspace  opacity along the right peripheral mid lung zone. Lucency at the left apex with no definite pneumothorax. Pneumomediastinum noted on CT chest not well visualized. CT abd/pelvis Partially visualized pneumomediastinum. Recommend CT chest with intravenous contrast for further evaluation. 2.   At least small volume sliding and paraesophageal hernia. 3. Decompressed urinary bladder with limited evaluation. Given findings, correlate with urinalysis for cystitis. 4.  Aortic Atherosclerosis  CT chest Very small left pneumothorax in the apex (5%). 2. Moderate to severe diffuse pneumomediastinum. 3. Stable pulmonary fibrosis.  No focal pneumonia. 4. Moderate cardiomegaly. 5. Moderate-sized hiatal hernia. 6. Aortic atherosclerosis. Pelvic XR Limited study due to contrast material on clothing or diaper. No visible fracture, and no fracture seen on earlier CT.  Antibiotic Therapy: Anti-infectives (From admission, onward)    Start     Dose/Rate Route Frequency Ordered Stop   11/30/23 1000  fluconazole (DIFLUCAN) tablet 100 mg        100 mg Oral Daily 11/30/23 0011 12/01/23 0959   11/30/23 1000  valACYclovir (VALTREX) tablet 500 mg        500 mg Oral Daily 11/30/23 0011     11/29/23 2230  cefTRIAXone (ROCEPHIN) 2 g in sodium chloride 0.9 % 100 mL IVPB        2 g 200 mL/hr over 30 Minutes Intravenous  Once 11/29/23 2222 11/29/23 2351   11/29/23 2230  azithromycin (ZITHROMAX) 500 mg in sodium chloride 0.9 % 250 mL IVPB        500 mg 250 mL/hr over 60 Minutes Intravenous  Once 11/29/23 2222 11/30/23 0332       Procedures:   Consultants:

## 2023-12-01 DIAGNOSIS — B370 Candidal stomatitis: Secondary | ICD-10-CM

## 2023-12-01 DIAGNOSIS — U071 COVID-19: Secondary | ICD-10-CM | POA: Diagnosis not present

## 2023-12-01 DIAGNOSIS — R1310 Dysphagia, unspecified: Secondary | ICD-10-CM | POA: Diagnosis not present

## 2023-12-01 LAB — COMPREHENSIVE METABOLIC PANEL WITH GFR
ALT: 8 U/L (ref 0–44)
AST: 17 U/L (ref 15–41)
Albumin: 2.7 g/dL — ABNORMAL LOW (ref 3.5–5.0)
Alkaline Phosphatase: 57 U/L (ref 38–126)
Anion gap: 10 (ref 5–15)
BUN: 6 mg/dL — ABNORMAL LOW (ref 8–23)
CO2: 21 mmol/L — ABNORMAL LOW (ref 22–32)
Calcium: 8.8 mg/dL — ABNORMAL LOW (ref 8.9–10.3)
Chloride: 103 mmol/L (ref 98–111)
Creatinine, Ser: 0.38 mg/dL — ABNORMAL LOW (ref 0.44–1.00)
GFR, Estimated: >60 mL/min (ref >60)
Glucose, Bld: 109 mg/dL — ABNORMAL HIGH (ref 70–99)
Potassium: 2.9 mmol/L — ABNORMAL LOW (ref 3.5–5.1)
Sodium: 134 mmol/L — ABNORMAL LOW (ref 135–145)
Total Bilirubin: 0.5 mg/dL (ref 0.0–1.2)
Total Protein: 5.6 g/dL — ABNORMAL LOW (ref 6.5–8.1)

## 2023-12-01 LAB — CBC WITH DIFFERENTIAL/PLATELET
Abs Immature Granulocytes: 0.2 10*3/uL — ABNORMAL HIGH (ref 0.00–0.07)
Basophils Absolute: 0 10*3/uL (ref 0.0–0.1)
Basophils Relative: 1 %
Eosinophils Absolute: 0.3 10*3/uL (ref 0.0–0.5)
Eosinophils Relative: 13 %
HCT: 34.6 % — ABNORMAL LOW (ref 36.0–46.0)
Hemoglobin: 11.8 g/dL — ABNORMAL LOW (ref 12.0–15.0)
Immature Granulocytes: 8 %
Lymphocytes Relative: 8 %
Lymphs Abs: 0.2 10*3/uL — ABNORMAL LOW (ref 0.7–4.0)
MCH: 30.3 pg (ref 26.0–34.0)
MCHC: 34.1 g/dL (ref 30.0–36.0)
MCV: 88.7 fL (ref 80.0–100.0)
Monocytes Absolute: 0.4 10*3/uL (ref 0.1–1.0)
Monocytes Relative: 16 %
Neutro Abs: 1.4 10*3/uL — ABNORMAL LOW (ref 1.7–7.7)
Neutrophils Relative %: 54 %
Platelets: 216 10*3/uL (ref 150–400)
RBC: 3.9 MIL/uL (ref 3.87–5.11)
RDW: 13.7 % (ref 11.5–15.5)
Smear Review: NORMAL
WBC: 2.5 10*3/uL — ABNORMAL LOW (ref 4.0–10.5)
nRBC: 0 % (ref 0.0–0.2)

## 2023-12-01 LAB — PREALBUMIN: Prealbumin: 6 mg/dL — ABNORMAL LOW (ref 18–38)

## 2023-12-01 MED ORDER — POTASSIUM CHLORIDE 20 MEQ PO PACK
40.0000 meq | PACK | Freq: Two times a day (BID) | ORAL | Status: DC
  Filled 2023-12-01: qty 2

## 2023-12-01 MED ORDER — POTASSIUM CHLORIDE 10 MEQ/100ML IV SOLN
10.0000 meq | INTRAVENOUS | Status: AC
  Administered 2023-12-01 (×4): 10 meq via INTRAVENOUS
  Filled 2023-12-01 (×4): qty 100

## 2023-12-01 MED ORDER — POTASSIUM CHLORIDE CRYS ER 20 MEQ PO TBCR: 40.0000 meq | EXTENDED_RELEASE_TABLET | Freq: Two times a day (BID) | ORAL | Status: DC

## 2023-12-01 MED ORDER — HYDROCODONE-ACETAMINOPHEN 5-325 MG PO TABS: 1.0000 | ORAL_TABLET | Freq: Four times a day (QID) | ORAL | Status: DC | PRN

## 2023-12-01 MED ORDER — POTASSIUM CHLORIDE CRYS ER 20 MEQ PO TBCR: 40.0000 meq | EXTENDED_RELEASE_TABLET | Freq: Once | ORAL | Status: DC

## 2023-12-01 NOTE — Evaluation (Signed)
 Physical Therapy Evaluation Patient Details Name: Rose Hill MRN: [id number] DOB: Sep 08, 1945 Today's Date: 12/01/2023  History of Present Illness  Patient is a 78 year old female with failure to thrive, physical deconditioning with ambulatory dysfunction, recently positive for COVID, UTI. 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  Clinical Impression  Patient is agreeable to PT session. Spouse at the bedside. Patient is usually ambulatory without an assistive device in the home. They have 2 steps to enter the house.  Today the patient has generalized weakness and is fatigued with minimal activity. She required Min A- Mod A for bed mobility. Sitting balance is fair. Patient requesting to return to bed after sitting up for less than 2 minutes. She declined attempting to stand due to fatigue. Spouse reporting the patient is still not eating. Anticipate the need for physical assistance with mobility after this hospital stay. Recommend rehabilitation < 3 hours/day pending patient and family goals as well as caregiver support at home. PT will continue to follow.       If plan is discharge home, recommend the following: A lot of help with walking and/or transfers;A lot of help with bathing/dressing/bathroom;Assistance with cooking/housework;Help with stairs or ramp for entrance   Can travel by private vehicle   No    Equipment Recommendations None recommended by PT  Recommendations for Other Services       Functional Status Assessment Patient has had a recent decline in their functional status and demonstrates the ability to make significant improvements in function in a reasonable and predictable amount of time.     Precautions / Restrictions Precautions Precautions: Fall Restrictions Weight Bearing Restrictions Per Provider  Order: No      Mobility  Bed Mobility Overal bed mobility: Needs Assistance Bed Mobility: Supine to Sit, Sit to Supine     Supine to sit: Min assist Sit to supine: Mod assist   General bed mobility comments: assistance for trunk support and scooting hips forward to sit upright. assistance for BLE with return to bed. +2 peson for boosting up in the bed    Transfers                   General transfer comment: patient reports she is too weak to try standing    Ambulation/Gait                  Stairs            Wheelchair Mobility     Tilt Bed    Modified Rankin (Stroke Patients Only)       Balance Overall balance assessment: Needs assistance Sitting-balance support: Feet supported Sitting balance-Leahy Scale: Fair                                       Pertinent Vitals/Pain Pain Assessment Pain Assessment: Faces Faces Pain Scale: Hurts a little bit Pain Location: mouth Pain Descriptors / Indicators: Discomfort Pain Intervention(s): Monitored during session    Home Living Family/patient expects to be discharged to:: Private residence Living Arrangements: Spouse/significant other Available Help at Discharge: Family Type of Home: House Home Access: Stairs to enter   Secretary/administrator of Steps: 2     Home Equipment: Agricultural consultant (2 wheels)      Prior Function  Prior Level of Function : Independent/Modified Independent             Mobility Comments: ambulatory without DME       Extremity/Trunk Assessment   Upper Extremity Assessment Upper Extremity Assessment: Generalized weakness    Lower Extremity Assessment Lower Extremity Assessment: Generalized weakness       Communication   Communication Communication: No apparent difficulties    Cognition Arousal: Alert Behavior During Therapy: Flat affect   PT - Cognitive impairments: No apparent impairments                          Following commands: Impaired Following commands impaired: Follows one step commands with increased time     Cueing Cueing Techniques: Verbal cues     General Comments General comments (skin integrity, edema, etc.): no dizziness with sitting upright. activity tolerance limited by fatigue and generalized weakness    Exercises     Assessment/Plan    PT Assessment Patient needs continued PT services  PT Problem List Decreased strength;Decreased range of motion;Decreased activity tolerance;Decreased balance;Decreased mobility;Decreased safety awareness       PT Treatment Interventions DME instruction;Gait training;Stair training;Functional mobility training;Therapeutic activities;Therapeutic exercise;Balance training;Neuromuscular re-education;Cognitive remediation;Patient/family education;Wheelchair mobility training    PT Goals (Current goals can be found in the Care Plan section)  Acute Rehab PT Goals Patient Stated Goal: spouse goal is to get the patient to eat more PT Goal Formulation: With family Time For Goal Achievement: 12/15/23 Potential to Achieve Goals: Fair    Frequency Min 2X/week     Co-evaluation               AM-PAC PT "6 Clicks" Mobility  Outcome Measure Help needed turning from your back to your side while in a flat bed without using bedrails?: A Lot Help needed moving from lying on your back to sitting on the side of a flat bed without using bedrails?: A Lot Help needed moving to and from a bed to a chair (including a wheelchair)?: A Lot Help needed standing up from a chair using your arms (e.g., wheelchair or bedside chair)?: A Lot Help needed to walk in hospital room?: A Lot Help needed climbing 3-5 steps with a railing? : Total 6 Click Score: 11    End of Session   Activity Tolerance: Patient limited by fatigue Patient left: in bed;with call bell/phone within reach;with bed alarm set;with family/visitor present   PT Visit Diagnosis: Muscle  weakness (generalized) (M62.81);Unsteadiness on feet (R26.81)    Time: 1212-1227 PT Time Calculation (min) (ACUTE ONLY): 15 min   Charges:   PT Evaluation $PT Eval Moderate Complexity: 1 Mod   PT General Charges $$ ACUTE PT VISIT: 1 Visit         Ozie Bo, PT, MPT   Erlene Hawks 12/01/2023, 2:01 PM

## 2023-12-01 NOTE — Consult Note (Signed)
 PHARMACY CONSULT NOTE - FOLLOW UP  Pharmacy Consult for Electrolyte Monitoring and Replacement   Recent Labs: Potassium (mmol/L)  Date Value  12/01/2023 2.9 (L)  07/01/2014 4.1   Calcium (mg/dL)  Date Value  16/05/9603 8.8 (L)   Albumin (g/dL)  Date Value  54/04/8118 2.7 (L)   Phosphorus (mg/dL)  Date Value  14/78/2956 3.0   Sodium (mmol/L)  Date Value  12/01/2023 134 (L)     Assessment: 78 y.o. 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   Goal of Therapy:  WNL.   Plan:  Kcl 40 mEq x 2 PO F/u with AM labs.   Trinidad Funk ,PharmD Clinical Pharmacist 12/01/2023 8:01 AM

## 2023-12-01 NOTE — Progress Notes (Signed)
 PROGRESS NOTE    Rose Hill  OZH:[id number] DOB: Dec 20, 1945 DOA: 11/29/2023 PCP: Sari Cunning, MD  Subjective: Does not have much appetite.  Still reports odynophagia, met with husband at bedside.  Ongoing SOB for several weeks. Her odynophagia has been persistent for several weeks. This proceeds her covid infection. Has had mouth sores for several weeks. Not taking much solid/liquids due to odynophagia.  Hospital Course: HPI: Rose Hill is a 78 y.o. 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  Patient has had a several week history of weakness, poor oral intake and weight loss in the setting of her acute infections and oral thrush.  Her weakness worsened since her COVID diagnosis to where she is too weak to ambulate. .  She denies cough, fever or chills, vomiting or diarrhea.  She has decreased urinary output without dysuria. ED course and data review: Tachycardic at 109 with otherwise normal vitals CBC with leukopenia of 3.4, otherwise unremarkable BMP unremarkable Urinalysis with rare bacteria EKG, please reviewed and interpreted showing sinus tachycardia at 110 CT abdomen and pelvis with contrast showing a partially visualized pneumomediastinum.  Follow-up CT chest with contrast shows a very small left pneumothorax 5% at apex with moderate to severe diffuse pneumomediastinum, stable pulmonary fibrosis and other nonacute findings   The ED provider spoke with radiologist to discuss findings.  Informed that pneumomediastinum possibly related to prior coughing.  No intervention recommended for 5%  pneumothorax   Patient was treated with an NS bolus, started on Rocephin and azithromycin for possible pneumonia (not shown on chest CT)   Hospitalist consulted for admission.   Significant Events: Admitted 11/29/2023 for adult FTT 4/13: PT, OT, ST, palliative care consult.  Replete electrolytes  Significant Labs: Na 138, K 4.2, CO2 of 23, BUN 8, Scr 0.43, glu 123 UA hazy, LE neg, nitrite Neg WBC 3.4, HgB 13.7, Plt 262 Covid Positive  Significant Imaging Studies: CXR Question developing airspace opacity along the right peripheral mid lung zone. Lucency at the left apex with no definite pneumothorax. Pneumomediastinum noted on CT chest not well visualized. CT abd/pelvis Partially visualized pneumomediastinum. Recommend CT chest with intravenous contrast for further evaluation. 2.   At least small volume sliding and paraesophageal hernia. 3. Decompressed urinary bladder with limited evaluation. Given findings, correlate with urinalysis for cystitis. 4.  Aortic Atherosclerosis  CT chest Very small left pneumothorax in the apex (5%). 2. Moderate to severe diffuse pneumomediastinum. 3. Stable pulmonary fibrosis.  No focal pneumonia. 4. Moderate cardiomegaly. 5. Moderate-sized hiatal hernia. 6. Aortic atherosclerosis. Pelvic XR Limited study due to contrast material on clothing or diaper. No visible fracture, and no fracture seen on earlier CT.  Antibiotic Therapy: Anti-infectives (From admission, onward)    Start     Dose/Rate Route Frequency Ordered Stop   11/30/23 1000  fluconazole (DIFLUCAN) tablet 100 mg        100 mg Oral Daily 11/30/23 0011 12/01/23 0959   11/30/23 1000  valACYclovir (VALTREX) tablet 500 mg        500 mg Oral Daily 11/30/23 0011     11/29/23 2230  cefTRIAXone (ROCEPHIN) 2 g in sodium chloride 0.9 % 100 mL IVPB  2 g 200 mL/hr over 30 Minutes Intravenous  Once 11/29/23 2222 11/29/23 2351   11/29/23 2230  azithromycin (ZITHROMAX) 500 mg in sodium chloride 0.9 %  250 mL IVPB        500 mg 250 mL/hr over 60 Minutes Intravenous  Once 11/29/23 2222 11/30/23 0332        Consultants: Palliative care   Assessment and Plan: * Failure to thrive (child) On admission. Frailty/physical deconditioning with ambulatory dysfunction. Poor oral intake Suspect secondary to recent acute infections including UTI, COVID, oral candidiasis as well as nutritional related to poor oral intake PT, OT and nutritional consult Supportive care Continue Megace  11-30-2023 FTT due to odynophagia and mouth sores. change to mechanical soft diet. Treat her oral thrush with IV diflucan, oral nystatin swish and spit. 4/13: PT, OT, palliative care consult.   COVID-19 virus infection On admission. Supportive care. Outside of therapeutic window of antiviral therapy. Patient is not hypoxic  continue supportive care  Pneumothorax on left 11-30-2023 esophageal perforation ruled out this morning with noncontrasted chest CT after discussion with radiologist.  Patient was initially placed on prophylactic Zosyn pending her CT chest.  Since she does not have a esophageal perforation, can stop the IV Zosyn.  More likely her pneumomediastinum is from her pneumothorax.  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  Her pneumothorax has already decreased in size since yesterday.   Odynophagia  patient has a prior history of Nissen fundoplication.  I think her mouth pain and odynophagia is due to candidiasis. Speech therapy recommends Dysphagia 3/mechanical soft with thin liquids  Pneumomediastinum (HCC) On admission. Small pneumothorax 5%. Per radiology/EDP conversation that possibly related to rupture of small bleb. Patient clinically stable-monitor for signs of decompensation  esophageal perforation ruled out with noncontrasted chest CT after discussion with radiologist.  Patient was initially placed on prophylactic Zosyn pending her CT chest.  Since she does not have a  esophageal perforation, so continued the IV Zosyn.  More likely her pneumomediastinum is from her pneumothorax.  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  Her pneumothorax has already decreased in size since yesterday.  Oral candidiasis On admission. Continue fluconazole until 4/13. Continue nystatin swish and spit. Continue viscous lidocaine as needed. Full liquid diet  -Continue IV Diflucan given her oral candidiasis and odynophagia.  Continue with Magic mouthwash with lidocaine swish, gargle, spit 4 times a day.  Leukopenia On admission. 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 On Venofer infusions from oncology This is chronic  Recurrent UTI 11-30-2023 last urine cx grew staph epi and aerococcus. Pt given po cipro. Staph epi resistant to cipro.  Continue po macrodantin. Pt cannot take doxycycline due to allergy.  Long-term current use of intravenous immunoglobulin (IVIG) On admission. Long-term current use of IVIG. Continue prophylactic Valtrex  11-30-2023 chronic. Received dose in March 2023 in heme/onc clinic.  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 Long-term current use of IVIG. Continue prophylactic Valtrex  11-30-2023 chronic.  Interstitial lung disease (HCC) 11-30-2023 chronic. Likely the cause of her pneumothorax  Diffuse large B-cell lymphoma of intra-abdominal lymph nodes (HCC) On admission. Secondary immunodeficiency .Recurrent UTIs (Klebsiella and Proteus 3/10). Oral thrush 11/18/2023 on IVIG infusion and prophylactic Valtrex, Urinalysis unremarkable Continue oral fluconazole for 14 days (3/31-4/13/25)  11-30-2023 chronic.  She is status post CAR-T therapy several years ago.  Followed by oncology at [facility]  Essential hypertension On admission. Continue metoprolol  11-30-2023 hold orders placed on lopressor.  S/P Nissen fundoplication (  without gastrostomy tube) procedure 11-30-2023 noted on EGD  in 05-2021.  DVT prophylaxis: enoxaparin (LOVENOX) injection 40 mg Start: 11/30/23 0800    Code Status: Full Code Family Communication: discussed with pt and husband at bedside Disposition Plan: return home with home health Reason for continuing need for hospitalization: on IV diflucan, Monitoring pneumomediastinum and pneumothorax.  Objective: Vitals:   11/30/23 2147 11/30/23 2323 12/01/23 0544 12/01/23 0840  BP: (!) 148/75 (!) 148/75 137/64 100/89  Pulse: 97 97 92 (!) 103  Resp: 16 16 16 20   Temp: 98.3 F (36.8 C) 98.3 F (36.8 C) 97.8 F (36.6 C) 98 F (36.7 C)  TempSrc: Oral  Oral Oral  SpO2: 99% 99% 98% 98%  Weight:      Height:        Intake/Output Summary (Last 24 hours) at 12/01/2023 1234 Last data filed at 12/01/2023 0500 Gross per 24 hour  Intake 150 ml  Output --  Net 150 ml   Filed Weights   11/29/23 1919  Weight: 54.4 kg   Examination:  Physical Exam Vitals and nursing note reviewed.  Constitutional:      General: She is not in acute distress.    Appearance: She is not toxic-appearing or diaphoretic.     Comments: Chronically ill appearing, cachetic female  HENT:     Head: Normocephalic and atraumatic.     Nose: Nose normal.  Eyes:     General: No scleral icterus. Cardiovascular:     Rate and Rhythm: Normal rate and regular rhythm.  Pulmonary:     Effort: Pulmonary effort is normal.     Breath sounds: Normal breath sounds.     Comments: Dry rales at bases No SQ crepitus/emphysema Abdominal:     General: Bowel sounds are normal. There is no distension.     Palpations: Abdomen is soft.     Tenderness: There is no abdominal tenderness.  Musculoskeletal:     Right lower leg: No edema.     Left lower leg: No edema.     Comments: Right ant port-a-cath  Skin:    General: Skin is warm and dry.     Capillary Refill: Capillary refill takes less than 2 seconds.  Neurological:     General: No focal deficit present.     Mental Status: She is alert  and oriented to person, place, and time.    Data Reviewed: I have personally reviewed following labs and imaging studies  CBC: Recent Labs  Lab 11/29/23 1932 12/01/23 0557  WBC 3.4* 2.5*  NEUTROABS 2.0 1.4*  HGB 13.7 11.8*  HCT 41.7 34.6*  MCV 93.3 88.7  PLT 262 216   Basic Metabolic Panel: Recent Labs  Lab 11/29/23 1932 12/01/23 0557  NA 138 134*  K 4.2 2.9*  CL 102 103  CO2 23 21*  GLUCOSE 123* 109*  BUN 8 6*  CREATININE 0.43* 0.38*  CALCIUM 9.6 8.8*   GFR: Estimated Creatinine Clearance: 51.4 mL/min (A) (by C-G formula based on SCr of 0.38 mg/dL (L)).  Recent Results (from the past 240 hours)  Resp panel by RT-PCR (RSV, Flu A&B, Covid) Anterior Nasal Swab     Status: Abnormal   Collection Time: 11/21/23 12:51 PM   Specimen: Anterior Nasal Swab  Result Value Ref Range Status   SARS Coronavirus 2 by RT PCR POSITIVE (A) NEGATIVE Final    Comment: (NOTE) SARS-CoV-2 target nucleic acids are DETECTED.  The SARS-CoV-2 RNA is generally detectable in upper respiratory specimens during the  acute phase of infection. Positive results are indicative of the presence of the identified virus, but do not rule out bacterial infection or co-infection with other pathogens not detected by the test. Clinical correlation with patient history and other diagnostic information is necessary to determine patient infection status. The expected result is Negative.  Fact Sheet for Patients: BloggerCourse.com  Fact Sheet for Healthcare Providers: SeriousBroker.it  This test is not yet approved or cleared by the [place]  FDA and  has been authorized for detection and/or diagnosis of SARS-CoV-2 by FDA under an Emergency Use Authorization (EUA).  This EUA will remain in effect (meaning this test can be used) for the duration of  the COVID-19 declaration under Section 564(b)(1) of the A ct, 21 U.S.C. section 360bbb-3(b)(1), unless the  authorization is terminated or revoked sooner.     Influenza A by PCR NEGATIVE NEGATIVE Final   Influenza B by PCR NEGATIVE NEGATIVE Final    Comment: (NOTE) The Xpert Xpress SARS-CoV-2/FLU/RSV plus assay is intended as an aid in the diagnosis of influenza from Nasopharyngeal swab specimens and should not be used as a sole basis for treatment. Nasal washings and aspirates are unacceptable for Xpert Xpress SARS-CoV-2/FLU/RSV testing.  Fact Sheet for Patients: BloggerCourse.com  Fact Sheet for Healthcare Providers: SeriousBroker.it  This test is not yet approved or cleared by the [place]  FDA and has been authorized for detection and/or diagnosis of SARS-CoV-2 by FDA under an Emergency Use Authorization (EUA). This EUA will remain in effect (meaning this test can be used) for the duration of the COVID-19 declaration under Section 564(b)(1) of the Act, 21 U.S.C. section 360bbb-3(b)(1), unless the authorization is terminated or revoked.     Resp Syncytial Virus by PCR NEGATIVE NEGATIVE Final    Comment: (NOTE) Fact Sheet for Patients: BloggerCourse.com  Fact Sheet for Healthcare Providers: SeriousBroker.it  This test is not yet approved or cleared by the [place]  FDA and has been authorized for detection and/or diagnosis of SARS-CoV-2 by FDA under an Emergency Use Authorization (EUA). This EUA will remain in effect (meaning this test can be used) for the duration of the COVID-19 declaration under Section 564(b)(1) of the Act, 21 U.S.C. section 360bbb-3(b)(1), unless the authorization is terminated or revoked.  Performed at [facility], [address] [place], [place] [address]      Radiology Studies: CT CHEST WO CONTRAST Result Date: 11/30/2023 CLINICAL DATA:  Pneumomediastinum. Evaluate for esophageal perforation. EXAM: CT CHEST WITHOUT CONTRAST  TECHNIQUE: Multidetector CT imaging of the chest was performed following the standard protocol without IV contrast. RADIATION DOSE REDUCTION: This exam was performed according to the departmental dose-optimization program which includes automated exposure control, adjustment of the mA and/or kV according to patient size and/or use of iterative reconstruction technique. COMPARISON:  Chest CT 11/29/2023. FINDINGS: Cardiovascular: The heart size is normal. No substantial pericardial effusion. Coronary artery calcification is evident. Mild atherosclerotic calcification is noted in the wall of the thoracic aorta. Right Port-A-Cath tip is positioned in the mid SVC. Mediastinum/Nodes: No mediastinal lymphadenopathy. No evidence for gross hilar lymphadenopathy although assessment is limited by the lack of intravenous contrast on the current study. Extensive pneumomediastinum again noted, similar to prior. Patient was given p.o. water-soluble contrast immediately prior to scanning. There is no contrast extravasation from the esophagus to suggest esophageal leak. Sequelae of Nissen fundoplication evident with hiatal hernia including the wrapped portion of the stomach and distal esophagus. No fluid within the mediastinum. There is no axillary lymphadenopathy. Lungs/Pleura:  The small left apical pneumothorax seen previously has decreased in the interval. Subpleural reticulation in both lungs with basilar predominant diffuse interstitial thickening suggests underlying pulmonary fibrosis. No dense consolidative airspace disease. No pleural effusion. Upper Abdomen: No acute findings within the visualized upper abdomen. Musculoskeletal: No worrisome lytic or sclerotic osseous abnormality. IMPRESSION: 1. Extensive pneumomediastinum again noted, similar to prior. Patient was given p.o. water-soluble contrast immediately prior to scanning. There is no contrast extravasation from the esophagus to suggest esophageal leak. 2. Sequelae of  Nissen fundoplication with hiatal hernia including the wrapped portion of the stomach and distal esophagus. 3. Small left apical pneumothorax seen previously has decreased in the interval. 4. Subpleural reticulation in both lungs with basilar predominant diffuse interstitial thickening suggests underlying pulmonary fibrosis. 5.  Aortic Atherosclerois (ICD10-170.0) Electronically Signed   By: Donnal Fusi M.D.   On: 11/30/2023 09:06   DG HIP UNILAT WITH PELVIS 2-3 VIEWS RIGHT Result Date: 11/30/2023 CLINICAL DATA:  Fall, right hip pain EXAM: DG HIP (WITH OR WITHOUT PELVIS) 2-3V RIGHT COMPARISON:  None Available. FINDINGS: Contrast material noted within the urinary bladder from earlier contrasted study. Contrast is now at external to the patient, possibly within clothing or diaper. This causes artifact over the bony structures of the lower pelvis, limiting study. No visible fracture, and no fracture seen on earlier CT. No subluxation or dislocation. IMPRESSION: Limited study due to contrast material on clothing or diaper. No visible fracture, and no fracture seen on earlier CT. Electronically Signed   By: Janeece Mechanic M.D.   On: 11/30/2023 02:17   CT Chest W Contrast Result Date: 11/29/2023 CLINICAL DATA:  Recent COVID with possible pneumonia. EXAM: CT CHEST WITH CONTRAST TECHNIQUE: Multidetector CT imaging of the chest was performed during intravenous contrast administration. RADIATION DOSE REDUCTION: This exam was performed according to the departmental dose-optimization program which includes automated exposure control, adjustment of the mA and/or kV according to patient size and/or use of iterative reconstruction technique. CONTRAST:  75mL OMNIPAQUE IOHEXOL 300 MG/ML  SOLN COMPARISON:  Chest x-ray 11/29/2023.  Head CT 12/11/2022. FINDINGS: Cardiovascular: The heart is moderately enlarged. There is no pericardial effusion. Aorta is normal in size. There are atherosclerotic calcifications of the aorta. Right  chest port catheter tip ends in the mid SVC. Mediastinum/Nodes: There is moderate severe diffuse pneumomediastinum. Visualized thyroid gland is within normal limits. No enlarged lymph nodes are identified. There is a moderate-sized hiatal hernia similar to prior. Lungs/Pleura: Peripheral fibrotic changes are seen throughout both lungs in the lower lobe predominance. There is no focal pneumonia or pleural effusion. There is a very small left pneumothorax in the apex (5%). Trachea and central airways are patent. Upper Abdomen: There is an 11 mm hypodensity in the spleen, possibly a complex cyst or hemangioma. This is unchanged from prior. Musculoskeletal: No chest wall abnormality. No acute or significant osseous findings. IMPRESSION: 1. Very small left pneumothorax in the apex (5%). 2. Moderate to severe diffuse pneumomediastinum. 3. Stable pulmonary fibrosis.  No focal pneumonia. 4. Moderate cardiomegaly. 5. Moderate-sized hiatal hernia. 6. Aortic atherosclerosis. Electronically Signed   By: Tyron Gallon M.D.   On: 11/29/2023 23:30   DG Chest Portable 1 View Addendum Date: 11/29/2023 ADDENDUM REPORT: 11/29/2023 22:29 ADDENDUM: Lucency at the left apex with no definite pneumothorax. Pneumomediastinum noted on CT chest not well visualized. Electronically Signed   By: Morgane  Naveau M.D.   On: 11/29/2023 22:29   Result Date: 11/29/2023 CLINICAL DATA:  Shortness of breath, COVID diagnosis,  evaluating for secondary pneumonia EXAM: PORTABLE CHEST 1 VIEW COMPARISON:  Chest x-ray 11/19/2023 FINDINGS: Right chest wall dialysis catheter with tip overlying the expected region of the distal superior vena cava. The heart and mediastinal contours are within normal limits. Question developing airspace opacity along the right peripheral mid lung zone. Right lung surgical pulmonary sutures. Chronic coarsened interstitial markings with no overt pulmonary edema. No pleural effusion. No pneumothorax. No acute osseous  abnormality. IMPRESSION: Question developing airspace opacity along the right peripheral mid lung zone. Electronically Signed: By: Morgane  Naveau M.D. On: 11/29/2023 22:12   CT ABDOMEN PELVIS W CONTRAST Addendum Date: 11/29/2023 ADDENDUM REPORT: 11/29/2023 22:29 ADDENDUM: These results were called by telephone at the time of interpretation on 11/29/2023 at 10:29 pm to provider DAVID WELLS , who verbally acknowledged these results. Electronically Signed   By: Morgane  Naveau M.D.   On: 11/29/2023 22:29   Result Date: 11/29/2023 CLINICAL DATA:  [place] right lower quadrant pain associated with nausea and dehydration. Evaluate for appendicitis or other acute intra-abdominal infection EXAM: CT ABDOMEN AND PELVIS WITH CONTRAST TECHNIQUE: Multidetector CT imaging of the abdomen and pelvis was performed using the standard protocol following bolus administration of intravenous contrast. RADIATION DOSE REDUCTION: This exam was performed according to the departmental dose-optimization program which includes automated exposure control, adjustment of the mA and/or kV according to patient size and/or use of iterative reconstruction technique. CONTRAST:  100mL OMNIPAQUE IOHEXOL 300 MG/ML  SOLN COMPARISON:  None Available. FINDINGS: Lower chest: Emphysematous changes. Pneumomediastinum. At least small volume sliding and paraesophageal hernia. Hepatobiliary: No focal liver abnormality. No gallstones, gallbladder wall thickening, or pericholecystic fluid. No biliary dilatation. Pancreas: Diffusely atrophic. No focal lesion. Otherwise normal pancreatic contour. No surrounding inflammatory changes. No main pancreatic ductal dilatation. Spleen: Normal in size.  Fluid density lesions likely cysts. Adrenals/Urinary Tract: No adrenal nodule bilaterally. Bilateral kidneys enhance symmetrically. Atrophy of the left inferior renal pole. No hydronephrosis. No hydroureter. The urinary bladder is decompressed with urinary bladder wall  thickening and mucosal hyperemia. Foci of gas within the urinary bladder lumen. On delayed imaging, there is no urothelial wall thickening and there are no filling defects in the opacified portions of the bilateral collecting systems or ureters. Stomach/Bowel: Stomach is within normal limits. No evidence of bowel wall thickening or dilatation. Appendix appears normal. Vascular/Lymphatic: No abdominal aorta or iliac aneurysm. Moderate atherosclerotic plaque of the aorta and its branches. No abdominal, pelvic, or inguinal lymphadenopathy. Reproductive: Status post hysterectomy. No adnexal masses. Other: No intraperitoneal free fluid. No intraperitoneal free gas. No organized fluid collection. Musculoskeletal: No abdominal wall hernia or abnormality. No suspicious lytic or blastic osseous lesions. No acute displaced fracture. IMPRESSION: 1. Partially visualized pneumomediastinum. Recommend CT chest with intravenous contrast for further evaluation. 2.   At least small volume sliding and paraesophageal hernia. 3. Decompressed urinary bladder with limited evaluation. Given findings, correlate with urinalysis for cystitis. 4.  Aortic Atherosclerosis (ICD10-I70.0). Electronically Signed: By: Morgane  Naveau M.D. On: 11/29/2023 22:19    Scheduled Meds:  enoxaparin (LOVENOX) injection  40 mg Subcutaneous Q24H   magic mouthwash w/lidocaine  15 mL Oral QID   megestrol  20 mg Oral Daily   metoprolol tartrate  12.5 mg Oral BID   nitrofurantoin (macrocrystal-monohydrate)  100 mg Oral Q12H   Ensure Max Protein  11 oz Oral BID   valACYclovir  500 mg Oral Daily   Continuous Infusions:  fluconazole (DIFLUCAN) IV 200 mg (12/01/23 1108)   potassium chloride 10 mEq (12/01/23  1205)     LOS: 1 day   Time spent: 35 minutes  Brenna Cam, MD  [facility]  12/01/2023, 12:34 PM

## 2023-12-01 NOTE — Plan of Care (Signed)

## 2023-12-02 DIAGNOSIS — B370 Candidal stomatitis: Secondary | ICD-10-CM | POA: Diagnosis not present

## 2023-12-02 DIAGNOSIS — U071 COVID-19: Secondary | ICD-10-CM | POA: Diagnosis not present

## 2023-12-02 DIAGNOSIS — R1310 Dysphagia, unspecified: Secondary | ICD-10-CM | POA: Diagnosis not present

## 2023-12-02 LAB — CBC
HCT: 35 % — ABNORMAL LOW (ref 36.0–46.0)
Hemoglobin: 12.1 g/dL (ref 12.0–15.0)
MCH: 30.9 pg (ref 26.0–34.0)
MCHC: 34.6 g/dL (ref 30.0–36.0)
MCV: 89.5 fL (ref 80.0–100.0)
Platelets: 209 10*3/uL (ref 150–400)
RBC: 3.91 MIL/uL (ref 3.87–5.11)
RDW: 13.6 % (ref 11.5–15.5)
WBC: 2.6 10*3/uL — ABNORMAL LOW (ref 4.0–10.5)
nRBC: 0 % (ref 0.0–0.2)

## 2023-12-02 LAB — MAGNESIUM: Magnesium: 1.8 mg/dL (ref 1.7–2.4)

## 2023-12-02 LAB — RENAL FUNCTION PANEL
Albumin: 2.7 g/dL — ABNORMAL LOW (ref 3.5–5.0)
Anion gap: 13 (ref 5–15)
BUN: 5 mg/dL — ABNORMAL LOW (ref 8–23)
CO2: 20 mmol/L — ABNORMAL LOW (ref 22–32)
Calcium: 9.2 mg/dL (ref 8.9–10.3)
Chloride: 102 mmol/L (ref 98–111)
Creatinine, Ser: 0.35 mg/dL — ABNORMAL LOW (ref 0.44–1.00)
GFR, Estimated: >60 mL/min (ref >60)
Glucose, Bld: 82 mg/dL (ref 70–99)
Phosphorus: 2.7 mg/dL (ref 2.5–4.6)
Potassium: 3.6 mmol/L (ref 3.5–5.1)
Sodium: 135 mmol/L (ref 135–145)

## 2023-12-02 MED ORDER — ENSURE ENLIVE PO LIQD
237.0000 mL | Freq: Three times a day (TID) | ORAL | Status: DC
  Administered 2023-12-02 – 2023-12-03 (×3): 237 mL via ORAL

## 2023-12-02 MED ORDER — POTASSIUM CHLORIDE CRYS ER 20 MEQ PO TBCR
20.0000 meq | EXTENDED_RELEASE_TABLET | Freq: Once | ORAL | Status: AC
  Administered 2023-12-02: 20 meq via ORAL
  Filled 2023-12-02: qty 1

## 2023-12-02 MED ORDER — ADULT MULTIVITAMIN W/MINERALS CH
1.0000 | ORAL_TABLET | Freq: Every day | ORAL | Status: DC
  Administered 2023-12-02 – 2023-12-03 (×2): 1 via ORAL
  Filled 2023-12-02 (×2): qty 1

## 2023-12-02 MED ORDER — GABAPENTIN 250 MG/5ML PO SOLN
300.0000 mg | Freq: Every day | ORAL | Status: DC
  Administered 2023-12-02: 300 mg via ORAL
  Filled 2023-12-02 (×2): qty 6

## 2023-12-02 MED ORDER — LATANOPROST 0.005 % OP SOLN
1.0000 [drp] | Freq: Every day | OPHTHALMIC | Status: DC
  Administered 2023-12-02: 1 [drp] via OPHTHALMIC
  Filled 2023-12-02: qty 2.5

## 2023-12-02 MED ORDER — GABAPENTIN 250 MG/5ML PO SOLN
300.0000 mg | Freq: Every day | ORAL | Status: DC
  Filled 2023-12-02: qty 6

## 2023-12-02 MED ORDER — MAGNESIUM SULFATE IN D5W 1-5 GM/100ML-% IV SOLN
1.0000 g | Freq: Once | INTRAVENOUS | Status: AC
  Administered 2023-12-02: 1 g via INTRAVENOUS
  Filled 2023-12-02: qty 100

## 2023-12-02 NOTE — Progress Notes (Signed)
 Initial Nutrition Assessment  DOCUMENTATION CODES:   Severe malnutrition in context of acute illness/injury  INTERVENTION:   -Continue dysphagia 3 diet -MVI with minerals daily -Ensure Enlive po TID, each supplement provides 350 kcal and 20 grams of protein -Magic cup TID with meals, each supplement provides 290 kcal and 9 grams of protein   NUTRITION DIAGNOSIS:   Severe Malnutrition related to acute illness as evidenced by percent weight loss, mild fat depletion, moderate fat depletion, moderate muscle depletion, severe muscle depletion.  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  Patient has had a several week history of weakness, poor oral intake and weight loss in the setting of her acute infections and oral thrush.  Her weakness worsened since her COVID diagnosis to where she is too weak to ambulate  Pt admitted with FTT and COVID-19 infection.   4/12- s/p BSE- dysphagia 3 diet with thin liquids   Per MD notes, pt with lt pneumothorax which has decreased in size.   Spoke with pt and husband at bedside. Pt pleasant and in good spirits, but complains of having no appetite. Pt consumed very little of breakfast tray this morning. Pt shares that she has had decreased oral intake over the past 5 weeks; per husband, pt was taking Cipro, which he attributes to the  development of mouth sores, which make it painful for pt to eat. Per pt, pain has improved since yesterday, but dysphagia 3 diet is not helpful. Noted meal completions documented at 20%. Pt husband reports pt had a good appetite and was maintaining her weight prior to 5 weeks ago. Pt typically consumes 2-3 meals per day.   Pt reports that she consumed "some" Ensure yesterday and rep[orts she will also drinks some today. Discussed importance of good meal and supplement intake to promote healing. RD discussed foods that require minimal chewing and may be appealing to pt. Pt amenable to ice cream; will also trial Magic Cups.   Reviewed wt hx; pt has experienced a 8.3% wt loss over the past month, which is significant for time frame.   Palliative care consult pending.   Medications reviewed and include megace and diflucan.  Labs reviewed: CBGS: 103.    NUTRITION - FOCUSED PHYSICAL EXAM:  Flowsheet Row Most Recent Value  Orbital Region Mild depletion  Upper Arm Region Moderate depletion  Thoracic and Lumbar Region Mild depletion  Buccal Region Moderate depletion  Temple Region Mild depletion  Clavicle Bone Region Severe depletion  Clavicle and Acromion Bone Region Severe depletion  Scapular Bone Region Severe depletion  Dorsal Hand Moderate depletion  Patellar Region Moderate depletion  Anterior Thigh Region Moderate depletion  Posterior Calf Region Moderate depletion  Edema (RD Assessment) Mild  Hair Reviewed  Eyes Reviewed  Mouth Reviewed  Skin Reviewed  Nails Reviewed       Diet Order:   Diet Order             DIET  DYS 3 Room service appropriate? Yes; Fluid consistency: Thin  Diet effective now                   EDUCATION NEEDS:   Education needs have been addressed  Skin:  Skin Assessment: Skin Integrity Issues: Skin Integrity Issues:: Other (Comment) Other: non-pressure wound to lt pretibial  Last BM:  11/30/23  Height:   Ht Readings from Last 1  Encounters:  11/29/23 5\' 4"  (1.626 m)    Weight:   Wt Readings from Last 1 Encounters:  11/29/23 54.4 kg    Ideal Body Weight:  54.5 kg  BMI:  Body mass index is 20.6 kg/m.  Estimated Nutritional Needs:   Kcal:  1700-1900  Protein:  90-105 grams  Fluid:  1.7-1.9 L    Herschel Lords, RD, LDN, CDCES Registered Dietitian III Certified Diabetes Care and Education Specialist If unable to reach this RD, please use "RD Inpatient" group chat on secure chat between hours of 8am-4 pm daily

## 2023-12-02 NOTE — Progress Notes (Signed)
 PROGRESS NOTE    Rose Hill  ZOX:[id number] DOB: 1946-04-03 DOA: 11/29/2023 PCP: Danella Penton, MD  Subjective: Feeling somewhat better.  Was able to eat some breakfast but remains weak.  Husband at bedside  Hospital Course: HPI: Rose Hill is a 78 y.o. 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  Patient has had a several week history of weakness, poor oral intake and weight loss in the setting of her acute infections and oral thrush.  Her weakness worsened since her COVID diagnosis to where she is too weak to ambulate. .  She denies cough, fever or chills, vomiting or diarrhea.  She has decreased urinary output without dysuria. ED course and data review: Tachycardic at 109 with otherwise normal vitals CBC with leukopenia of 3.4, otherwise unremarkable BMP unremarkable Urinalysis with rare bacteria EKG, please reviewed and interpreted showing sinus tachycardia at 110 CT abdomen and pelvis with contrast showing a partially visualized pneumomediastinum.  Follow-up CT chest with contrast shows a very small left pneumothorax 5% at apex with moderate to severe diffuse pneumomediastinum, stable pulmonary fibrosis and other nonacute findings   The ED provider spoke with radiologist to discuss findings.  Informed that pneumomediastinum possibly related to prior coughing.  No intervention recommended for 5% pneumothorax   Patient was treated with an NS bolus, started on Rocephin and azithromycin for possible pneumonia (not shown on chest CT)   Hospitalist consulted for admission.   Significant Events: Admitted  11/29/2023 for adult FTT 4/13: PT, OT, ST, palliative care consult.  Replete electrolytes 4/14: TOC working on placement/SNF  Significant Labs: Na 138, K 4.2, CO2 of 23, BUN 8, Scr 0.43, glu 123 UA hazy, LE neg, nitrite Neg WBC 3.4, HgB 13.7, Plt 262 Covid Positive  Significant Imaging Studies: CXR Question developing airspace opacity along the right peripheral mid lung zone. Lucency at the left apex with no definite pneumothorax. Pneumomediastinum noted on CT chest not well visualized. CT abd/pelvis Partially visualized pneumomediastinum. Recommend CT chest with intravenous contrast for further evaluation. 2.   At least small volume sliding and paraesophageal hernia. 3. Decompressed urinary bladder with limited evaluation. Given findings, correlate with urinalysis for cystitis. 4.  Aortic Atherosclerosis  CT chest Very small left pneumothorax in the apex (5%). 2. Moderate to severe diffuse pneumomediastinum. 3. Stable pulmonary fibrosis.  No focal pneumonia. 4. Moderate cardiomegaly. 5. Moderate-sized hiatal hernia. 6. Aortic atherosclerosis. Pelvic XR Limited study due to contrast material on clothing or diaper. No visible fracture, and no fracture seen on earlier CT.  Antibiotic Therapy: Anti-infectives (From admission, onward)    Start     Dose/Rate Route Frequency Ordered Stop   11/30/23 1000  fluconazole (DIFLUCAN) tablet 100 mg        100 mg Oral Daily 11/30/23 0011 12/01/23 0959   11/30/23 1000  valACYclovir (VALTREX) tablet 500 mg        500 mg Oral Daily 11/30/23 0011     11/29/23 2230  cefTRIAXone (ROCEPHIN) 2 g in sodium chloride 0.9 % 100 mL IVPB        2 g 200 mL/hr over 30 Minutes Intravenous  Once 11/29/23 2222 11/29/23 2351   11/29/23 2230  azithromycin (ZITHROMAX) 500 mg in sodium chloride 0.9 % 250 mL IVPB        500 mg 250 mL/hr over 60 Minutes Intravenous  Once 11/29/23 2222 11/30/23 0332        Consultants: Palliative care   Assessment and Plan: * Failure  to thrive (child) On admission. Frailty/physical deconditioning with ambulatory dysfunction. Poor oral intake Suspect secondary to recent acute infections including UTI, COVID, oral candidiasis as well as nutritional related to poor oral intake PT, OT and nutritional input appreciate Supportive care Continue Megace  11-30-2023 FTT due to odynophagia and mouth sores. change to mechanical soft diet. Treat her oral thrush with IV diflucan, oral nystatin swish and spit. 4/13: PT, OT, palliative care consult.   4/14: Waiting for placement/SNF versus home with home health  COVID-19 virus infection On admission. Supportive care. Outside of therapeutic window of antiviral therapy. Patient is not hypoxic  continue supportive care  Pneumothorax on left 11-30-2023 esophageal perforation ruled out this morning with noncontrasted chest CT after discussion with radiologist.  Patient was initially placed on prophylactic Zosyn pending her CT chest.  Since she does not have a esophageal perforation, can stop the IV Zosyn.  More likely her pneumomediastinum is from her pneumothorax.  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  Her pneumothorax has already decreased in size since yesterday.   Odynophagia  patient has a prior history of Nissen fundoplication.  I think her mouth pain and odynophagia is due to candidiasis. Speech therapy recommends Dysphagia 3/mechanical soft with thin liquids  Pneumomediastinum (HCC) On admission. Small pneumothorax 5%. Per radiology/EDP conversation that possibly related to rupture of small bleb. Patient clinically stable-monitor for signs of decompensation  esophageal perforation ruled out with noncontrasted chest CT after discussion with radiologist.  Patient was initially placed on prophylactic Zosyn pending her CT chest.  Since she does not have a esophageal perforation, so continued the IV Zosyn.  More likely her pneumomediastinum is from her pneumothorax.   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  Her pneumothorax has already decreased in size since yesterday.  Oral candidiasis Present on admission. Continue viscous lidocaine as needed.  Tolerating diet  -Continue IV Diflucan given her oral candidiasis and odynophagia.  Continue with Magic mouthwash with lidocaine swish, gargle, spit 4 times a day.  Leukopenia On admission. 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 On Venofer infusions from oncology This is chronic  Recurrent UTI last urine cx grew staph epi and aerococcus. Pt given po cipro. Staph epi resistant to cipro.  Continue po macrodantin. Pt cannot take doxycycline due to allergy.  Long-term current use of intravenous immunoglobulin (IVIG) On admission. Long-term current use of IVIG. Continue prophylactic Valtrex chronic. Received dose in March 2023 in heme/onc clinic.  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 Long-term current use of IVIG. Continue prophylactic Valtrex  chronic.  Interstitial lung disease (HCC) chronic. Likely the cause of her pneumothorax  Diffuse large B-cell lymphoma of intra-abdominal lymph nodes (HCC) On admission. Secondary immunodeficiency .Recurrent UTIs (Klebsiella and Proteus 3/10). Oral thrush 11/18/2023 on IVIG infusion and prophylactic Valtrex, Urinalysis unremarkable Continue oral fluconazole for 14 days (3/31-4/13/25)  chronic.  She is status post CAR-T therapy several years ago.  Followed by oncology at [facility] oncology  Essential hypertension On admission. Continue metoprolol hold orders placed on lopressor.  S/P Nissen fundoplication (without gastrostomy tube) procedure noted on EGD in 05-2021.  DVT prophylaxis: enoxaparin (LOVENOX) injection 40 mg Start: 11/30/23 0800    Code  Status: Full Code Family Communication: discussed with pt and husband at bedside Disposition Plan: return home with home health Reason for continuing  need for hospitalization: on IV diflucan, Monitoring pneumomediastinum and pneumothorax.  Objective: Vitals:   12/01/23 1644 12/01/23 1958 12/02/23 0632 12/02/23 0725  BP: (!) 123/59 126/66 122/69 126/68  Pulse: 86 89 (!) 107 (!) 108  Resp: 18 18 18 16   Temp: 98.6 F (37 C) 98.3 F (36.8 C) 98.4 F (36.9 C) 98.5 F (36.9 C)  TempSrc: Oral     SpO2: 99% 98% 97% 97%  Weight:      Height:        Intake/Output Summary (Last 24 hours) at 12/02/2023 1332 Last data filed at 12/01/2023 2205 Gross per 24 hour  Intake 840 ml  Output --  Net 840 ml   Filed Weights   11/29/23 1919  Weight: 54.4 kg   Examination:  Physical Exam Vitals and nursing note reviewed.  Constitutional:      General: She is not in acute distress.    Appearance: She is not toxic-appearing or diaphoretic.     Comments: Chronically ill appearing, cachetic female  HENT:     Head: Normocephalic and atraumatic.     Nose: Nose normal.  Eyes:     General: No scleral icterus. Cardiovascular:     Rate and Rhythm: Normal rate and regular rhythm.  Pulmonary:     Effort: Pulmonary effort is normal.     Breath sounds: Normal breath sounds.  Abdominal:     General: Bowel sounds are normal. There is no distension.     Palpations: Abdomen is soft.     Tenderness: There is no abdominal tenderness.  Musculoskeletal:     Right lower leg: No edema.     Left lower leg: No edema.     Comments: Right ant port-a-cath  Skin:    General: Skin is warm and dry.     Capillary Refill: Capillary refill takes less than 2 seconds.  Neurological:     General: No focal deficit present.     Mental Status: She is alert and oriented to person, place, and time.    Data Reviewed: I have personally reviewed following labs and imaging studies  CBC: Recent Labs  Lab 11/29/23 1932 12/01/23 0557 12/02/23 0445  WBC 3.4* 2.5* 2.6*  NEUTROABS 2.0 1.4*  --   HGB 13.7 11.8* 12.1  HCT 41.7 34.6* 35.0*  MCV 93.3 88.7 89.5  PLT 262  216 209   Basic Metabolic Panel: Recent Labs  Lab 11/29/23 1932 12/01/23 0557 12/02/23 0445  NA 138 134* 135  K 4.2 2.9* 3.6  CL 102 103 102  CO2 23 21* 20*  GLUCOSE 123* 109* 82  BUN 8 6* 5*  CREATININE 0.43* 0.38* 0.35*  CALCIUM 9.6 8.8* 9.2  MG  --   --  1.8  PHOS  --   --  2.7   GFR: Estimated Creatinine Clearance: 51.4 mL/min (A) (by C-G formula based on SCr of 0.35 mg/dL (L)).  No results found for this or any previous visit (from the past 240 hours).    Radiology Studies: No results found.   Scheduled Meds:  enoxaparin (LOVENOX) injection  40 mg Subcutaneous Q24H   feeding supplement  237 mL Oral TID BM   gabapentin  300 mg Oral QHS   latanoprost  1 drop Both Eyes QHS   magic mouthwash w/lidocaine  15 mL Oral QID   megestrol  20 mg  Oral Daily   metoprolol tartrate  12.5 mg Oral BID   multivitamin with minerals  1 tablet Oral Daily   nitrofurantoin (macrocrystal-monohydrate)  100 mg Oral Q12H   valACYclovir  500 mg Oral Daily   Continuous Infusions:  fluconazole (DIFLUCAN) IV 200 mg (12/02/23 1040)   magnesium sulfate bolus IVPB 1 g (12/02/23 1241)     LOS: 2 days   Time spent: 35 minutes  Brenna Cam, MD  [facility]  12/02/2023, 1:32 PM

## 2023-12-02 NOTE — Plan of Care (Signed)

## 2023-12-02 NOTE — [place] FL2 (Signed)
  MEDICAID FL2 LEVEL OF CARE FORM     IDENTIFICATION  Patient Name: Rose Hill Birthdate: 01-03-1946 Sex: female Admission Date (Current Location): 11/29/2023  [facility] and [place] Number:  Chiropodist and Address:  [facility], [address] [place], [place] [address]      Provider Number: [id number]  Attending Physician Name and Address:  Delfino Lovett, MD  Relative Name and Phone Number:  Brenya, [place]ul[place] (Spouse)  [phone]    Current Level of Care: Hospital Recommended Level of Care: Skilled Nursing Facility Prior Approval Number:    Date Approved/Denied:   PASRR Number: [id number]  Discharge Plan: SNF    Current Diagnoses: Patient Active Problem List   Diagnosis Date Noted   Leukopenia 11/30/2023   Pneumomediastinum (HCC) 11/30/2023   Odynophagia 11/30/2023   S/P Nissen fundoplication (without gastrostomy tube) procedure 11/30/2023   Pneumothorax on left 11/30/2023   Failure to thrive (child) 11/29/2023   COVID-19 virus infection 11/29/2023   Oral candidiasis 11/29/2023   Recurrent UTI 11/29/2023   Bone marrow transplant status (HCC) 06/11/2022   Long-term current use of intravenous immunoglobulin (IVIG) 11/07/2021   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) 09/28/2021   Carpal tunnel syndrome 05/01/2019   IBS (irritable bowel syndrome) 05/01/2019   Aortic atherosclerosis (HCC) 03/03/2018   DM type 2 with diabetic mixed hyperlipidemia (HCC) 03/03/2018   Diffuse large B-cell lymphoma of lymph nodes of multiple regions (HCC) 11/06/2017   Encounter for monitoring cardiotoxic drug therapy 10/22/2016   Protein-calorie malnutrition, severe 09/18/2016   Diffuse large B-cell lymphoma of intra-abdominal lymph nodes (HCC) 08/30/2016   Small B-cell lymphoma of intra-abdominal lymph nodes (HCC) 07/27/2016   History of nonmelanoma skin cancer 03/25/2016   LPRD (laryngopharyngeal reflux disease) 06/20/2015    Interstitial lung disease (HCC) 03/23/2013   Type 2 diabetes mellitus (HCC) 10/20/2008   Hyperlipidemia, mixed 10/20/2008   OBSTRUCTIVE SLEEP APNEA 10/20/2008   Essential hypertension 10/20/2008   Allergic rhinitis 10/20/2008   PULMONARY FIBROSIS 10/20/2008    Orientation RESPIRATION BLADDER Height & Weight     Self    Incontinent Weight: 54.4 kg Height:  5\' 4"  (162.6 cm)  BEHAVIORAL SYMPTOMS/MOOD NEUROLOGICAL BOWEL NUTRITION STATUS      Incontinent Diet (Dysphagia III)  AMBULATORY STATUS COMMUNICATION OF NEEDS Skin   Limited Assist Verbally                         Personal Care Assistance Level of Assistance  Feeding, Bathing, Dressing Bathing Assistance: Limited assistance Feeding assistance: Limited assistance Dressing Assistance: Limited assistance     Functional Limitations Info             SPECIAL CARE FACTORS FREQUENCY  PT (By licensed PT), OT (By licensed OT)     PT Frequency: 5 x week OT Frequency: 5  week            Contractures      Additional Factors Info  Code Status, Allergies Code Status Info: FULL Allergies Info: Cefuroxime Axetil, Levaquin, Doxicline           Current Medications (12/02/2023):  This is the current hospital active medication list Current Facility-Administered Medications  Medication Dose Route Frequency Provider Last Rate Last Admin   acetaminophen (TYLENOL) tablet 650 mg  650 mg Oral Q6H PRN Andris Baumann, MD       Or   acetaminophen (TYLENOL) suppository 650 mg  650 mg Rectal Q6H PRN Andris Baumann,  MD       enoxaparin (LOVENOX) injection 40 mg  40 mg Subcutaneous Q24H Duncan, Hazel V, MD   40 mg at 12/02/23 0953   feeding supplement (ENSURE ENLIVE / ENSURE PLUS) liquid 237 mL  237 mL Oral TID BM Shah, Vipul, MD       fluconazole (DIFLUCAN) IVPB 200 mg  200 mg Intravenous Q24H Unk Garb, DO 100 mL/hr at 12/02/23 1040 200 mg at 12/02/23 1040   gabapentin (NEURONTIN) 250 MG/5ML solution 300 mg  300 mg Oral QHS  Shah, Vipul, MD       HYDROcodone-acetaminophen (NORCO/VICODIN) 5-325 MG per tablet 1-2 tablet  1-2 tablet Oral Q6H PRN Shah, Vipul, MD       latanoprost (XALATAN) 0.005 % ophthalmic solution 1 drop  1 drop Both Eyes QHS Brenna Cam, MD       magic mouthwash w/lidocaine  15 mL Oral QID Unk Garb, DO   15 mL at 12/02/23 5409   megestrol (MEGACE) tablet 20 mg  20 mg Oral Daily Duncan, Hazel V, MD   20 mg at 12/02/23 0957   metoprolol tartrate (LOPRESSOR) tablet 12.5 mg  12.5 mg Oral BID Unk Garb, DO   12.5 mg at 12/02/23 0956   multivitamin with minerals tablet 1 tablet  1 tablet Oral Daily Brenna Cam, MD       nitrofurantoin (macrocrystal-monohydrate) (MACROBID) capsule 100 mg  100 mg Oral Q12H Unk Garb, DO   100 mg at 12/02/23 0957   ondansetron (ZOFRAN) tablet 4 mg  4 mg Oral Q6H PRN Duncan, Hazel V, MD       Or   ondansetron (ZOFRAN) injection 4 mg  4 mg Intravenous Q6H PRN Duncan, Hazel V, MD       valACYclovir (VALTREX) tablet 500 mg  500 mg Oral Daily Duncan, Hazel V, MD   500 mg at 12/02/23 0955   Facility-Administered Medications Ordered in Other Encounters  Medication Dose Route Frequency Provider Last Rate Last Admin   0.9 %  sodium chloride infusion   Intravenous Continuous Gwyn Leos, MD 10 mL/hr at 06/19/21 1230 Continued from Pre-op at 06/19/21 1230   0.9 %  sodium chloride infusion   Intravenous Continuous Gwyn Leos, MD   Stopped at 12/26/16 1011   0.9 %  sodium chloride infusion   Intravenous Continuous Brahmanday, Govinda R, MD 10 mL/hr at 12/27/16 0930 New Bag at 12/27/16 0930   heparin lock flush 100 unit/mL  500 Units Intravenous Once Brahmanday, Govinda R, MD       heparin lock flush 100 unit/mL  500 Units Intravenous Once Brahmanday, Govinda R, MD       sodium chloride flush (NS) 0.9 % injection 10 mL  10 mL Intravenous PRN Brahmanday, Govinda R, MD   10 mL at 11/06/16 1000   sodium chloride flush (NS) 0.9 % injection 10 mL  10 mL Intravenous PRN  Brahmanday, Govinda R, MD   10 mL at 11/07/16 0905   sodium chloride flush (NS) 0.9 % injection 10 mL  10 mL Intravenous PRN Brahmanday, Govinda R, MD       sodium chloride flush (NS) 0.9 % injection 10 mL  10 mL Intravenous PRN Brahmanday, Govinda R, MD   10 mL at 12/05/16 1030   sodium chloride flush (NS) 0.9 % injection 10 mL  10 mL Intravenous PRN Brahmanday, Govinda R, MD   10 mL at 12/27/16 0930   sodium chloride flush (NS) 0.9 % injection 10 mL  10 mL Intravenous PRN Brahmanday, Govinda R, MD   10 mL at 09/27/20 1305     Discharge Medications: Please see discharge summary for a list of discharge medications.  Relevant Imaging Results:  Relevant Lab Results:   Additional Information [id number]  Elsie Halo, RN

## 2023-12-02 NOTE — Consult Note (Signed)
 PHARMACY CONSULT NOTE - FOLLOW UP  Pharmacy Consult for Electrolyte Monitoring and Replacement   Recent Labs: Potassium (mmol/L)  Date Value  12/02/2023 3.6  07/01/2014 4.1   Magnesium (mg/dL)  Date Value  29/56/2130 1.8   Calcium (mg/dL)  Date Value  86/57/8469 9.2   Albumin (g/dL)  Date Value  62/95/2841 2.7 (L)   Phosphorus (mg/dL)  Date Value  32/44/0102 2.7   Sodium (mmol/L)  Date Value  12/02/2023 135     Assessment: 78 y.o. 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   Goal of Therapy: WNL  Plan:  K=3.6 (improved from 2.9 and al lower end of normal range). Will replace with Kcl 20meq po x 1 dose today.  Mg 1.8, will replace with MgSulfate 1gm IV x 1 dose Follow AM labs   Rose Hill PharmD, BCPS 12/02/2023 8:08 AM

## 2023-12-02 NOTE — Evaluation (Signed)
 Occupational Therapy Evaluation Patient Details Name: Rose Hill MRN: [id number] DOB: 08/05/1946 Today's Date: 12/02/2023   History of Present Illness   Patient is a 78 year old female with failure to thrive, physical deconditioning with ambulatory dysfunction, recently positive for COVID, UTI. 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     Clinical Impressions Pt was seen for OT evaluation this date. Prior to hospital admission, pt was living at home with her husband and ambulatory without AD, IND prior to ~5 weeks ago per husband.  Pt presents to acute OT demonstrating impaired ADL performance and functional mobility 2/2 weakness, low activity tolerance, balance deficits. Pt currently requires Min A for bed mobility with very limited activity tolerance and generalized weakness. Able to wash face with set up assist in long sitting/halfway to EOB. Able to don bil socks in long sitting with SUP. Attempted lateral scooting to [facility] with pt able to forward scoot with SUP today. Needed Min A to return to supine and Mod A x2 to scoot to [facility]. Pt set up for breakfast.  Pt would benefit from skilled OT services to address noted impairments and functional limitations (see below for any additional details) in order to maximize safety and independence while minimizing falls risk and caregiver burden. Do anticipate the need for follow up OT services upon acute hospital DC.      If plan is discharge home, recommend the following:   A lot of help with walking and/or transfers;A lot of help with bathing/dressing/bathroom;Assistance with cooking/housework;Help with stairs or ramp for entrance     Functional Status Assessment   Patient has had a recent decline in their functional status and demonstrates the ability to make significant improvements  in function in a reasonable and predictable amount of time.     Equipment Recommendations   Other (comment) (defer)     Recommendations for Other Services         Precautions/Restrictions   Precautions Precautions: Fall Restrictions Weight Bearing Restrictions Per Provider Order: No     Mobility Bed Mobility Overal bed mobility: Needs Assistance Bed Mobility: Supine to Sit, Sit to Supine     Supine to sit: Min assist Sit to supine: Min assist   General bed mobility comments: verb cues, assist for trunk elevation to reach EOB, but able to forward scoot on her own; Min A to return to supine for BLE management and +2 to scoot up to [facility]    Transfers                   General transfer comment: deferred, pt is too weak/limited activity tolerance      Balance Overall balance assessment: Needs assistance Sitting-balance support: Feet supported Sitting balance-Leahy Scale: Fair                                     ADL either performed or assessed with clinical judgement   ADL Overall ADL's : Needs assistance/impaired     Grooming: Wash/dry face;Set up;Sitting Grooming Details (indicate cue type and reason): long sitting/halfway sitting EOB             Lower Body Dressing: Set up;Sitting/lateral leans Lower Body Dressing Details (indicate cue type and reason): to don socks while long sitting at EOB  Vision         Perception         Praxis         Pertinent Vitals/Pain Pain Assessment Pain Assessment: Faces Faces Pain Scale: Hurts a little bit Pain Location: mouth Pain Descriptors / Indicators: Discomfort Pain Intervention(s): Monitored during session     Extremity/Trunk Assessment Upper Extremity Assessment Upper Extremity Assessment: Generalized weakness   Lower Extremity Assessment Lower Extremity Assessment: Generalized weakness       Communication Communication Communication: No  apparent difficulties   Cognition Arousal: Alert Behavior During Therapy: Flat affect                                 Following commands: Impaired Following commands impaired: Follows one step commands with increased time     Cueing  General Comments   Cueing Techniques: Verbal cues  fatigues very easily with activity   Exercises Other Exercises Other Exercises: Edu on role of OT in acute setting.   Shoulder Instructions      Home Living Family/patient expects to be discharged to:: Private residence Living Arrangements: Spouse/significant other Available Help at Discharge: Family Type of Home: House Home Access: Stairs to enter Secretary/administrator of Steps: 2               Bathroom Accessibility: Yes   Home Equipment: Agricultural consultant (2 wheels)          Prior Functioning/Environment Prior Level of Function : Independent/Modified Independent             Mobility Comments: ambulatory without DME      OT Problem List: Decreased strength;Decreased activity tolerance;Impaired balance (sitting and/or standing)   OT Treatment/Interventions: Self-care/ADL training;Balance training;Therapeutic exercise;Therapeutic activities;Energy conservation;Patient/family education      OT Goals(Current goals can be found in the care plan section)   Acute Rehab OT Goals Patient Stated Goal: improve strength OT Goal Formulation: With patient/family Time For Goal Achievement: 12/16/23 Potential to Achieve Goals: Good ADL Goals Pt Will Perform Upper Body Bathing: with supervision;sitting Pt Will Perform Lower Body Dressing: with supervision;sitting/lateral leans;sit to/from stand Pt Will Transfer to Toilet: with contact guard assist;bedside commode;ambulating;regular height toilet Pt Will Perform Toileting - Clothing Manipulation and hygiene: with contact guard assist;sitting/lateral leans;sit to/from stand   OT Frequency:  Min 2X/week     Co-evaluation              AM-PAC OT "6 Clicks" Daily Activity     Outcome Measure Help from another person eating meals?: None Help from another person taking care of personal grooming?: A Little Help from another person toileting, which includes using toliet, bedpan, or urinal?: A Lot Help from another person bathing (including washing, rinsing, drying)?: A Lot Help from another person to put on and taking off regular upper body clothing?: A Little Help from another person to put on and taking off regular lower body clothing?: A Lot 6 Click Score: 16   End of Session    Activity Tolerance: Patient tolerated treatment well;Patient limited by fatigue Patient left: in bed;with call bell/phone within reach;with bed alarm set;with family/visitor present  OT Visit Diagnosis: Other abnormalities of gait and mobility (R26.89);Muscle weakness (generalized) (M62.81)                Time: 0454-0981 OT Time Calculation (min): 15 min Charges:  OT General Charges $OT Visit: 1 Visit OT Evaluation $OT Eval Moderate Complexity: 1  Mod Antoney Biven, OTR/L 12/02/23, 9:39 AM  Marice Guidone E Sarra Rachels 12/02/2023, 9:35 AM

## 2023-12-03 ENCOUNTER — Telehealth: Payer: Self-pay

## 2023-12-03 ENCOUNTER — Encounter: Payer: Self-pay | Admitting: Internal Medicine

## 2023-12-03 ENCOUNTER — Other Ambulatory Visit: Payer: Self-pay

## 2023-12-03 ENCOUNTER — Ambulatory Visit: Admitting: Infectious Diseases

## 2023-12-03 DIAGNOSIS — R627 Adult failure to thrive: Secondary | ICD-10-CM

## 2023-12-03 DIAGNOSIS — J939 Pneumothorax, unspecified: Secondary | ICD-10-CM | POA: Diagnosis not present

## 2023-12-03 DIAGNOSIS — U071 COVID-19: Secondary | ICD-10-CM | POA: Diagnosis not present

## 2023-12-03 DIAGNOSIS — J982 Interstitial emphysema: Secondary | ICD-10-CM | POA: Diagnosis not present

## 2023-12-03 LAB — RENAL FUNCTION PANEL
Albumin: 2.8 g/dL — ABNORMAL LOW (ref 3.5–5.0)
Anion gap: 9 (ref 5–15)
BUN: 9 mg/dL (ref 8–23)
CO2: 21 mmol/L — ABNORMAL LOW (ref 22–32)
Calcium: 9 mg/dL (ref 8.9–10.3)
Chloride: 104 mmol/L (ref 98–111)
Creatinine, Ser: 0.38 mg/dL — ABNORMAL LOW (ref 0.44–1.00)
GFR, Estimated: >60 mL/min (ref >60)
Glucose, Bld: 98 mg/dL (ref 70–99)
Phosphorus: 2.7 mg/dL (ref 2.5–4.6)
Potassium: 3.5 mmol/L (ref 3.5–5.1)
Sodium: 134 mmol/L — ABNORMAL LOW (ref 135–145)

## 2023-12-03 LAB — MAGNESIUM: Magnesium: 2.1 mg/dL (ref 1.7–2.4)

## 2023-12-03 MED ORDER — NYSTATIN 100000 UNIT/ML MT SUSP
15.0000 mL | Freq: Four times a day (QID) | OROMUCOSAL | 0 refills | Status: AC
  Filled 2023-12-03: qty 480, 8d supply, fill #0

## 2023-12-03 MED ORDER — POTASSIUM CHLORIDE CRYS ER 20 MEQ PO TBCR
20.0000 meq | EXTENDED_RELEASE_TABLET | Freq: Once | ORAL | Status: AC
  Administered 2023-12-03: 20 meq via ORAL
  Filled 2023-12-03: qty 1

## 2023-12-03 MED ORDER — METOPROLOL TARTRATE 25 MG PO TABS
12.5000 mg | ORAL_TABLET | Freq: Every day | ORAL | 0 refills | Status: AC
  Filled 2023-12-03: qty 30, 60d supply, fill #0

## 2023-12-03 MED ORDER — FLUCONAZOLE 100 MG PO TABS
100.0000 mg | ORAL_TABLET | Freq: Every day | ORAL | 0 refills | Status: AC
  Filled 2023-12-03: qty 14, 14d supply, fill #0

## 2023-12-03 MED ORDER — DEXAMETHASONE 4 MG PO TABS
4.0000 mg | ORAL_TABLET | Freq: Every day | ORAL | 0 refills | Status: AC
  Filled 2023-12-03: qty 10, 10d supply, fill #0

## 2023-12-03 MED ORDER — ENSURE ENLIVE PO LIQD
237.0000 mL | Freq: Three times a day (TID) | ORAL | 12 refills | Status: DC
  Filled 2023-12-03: qty 237, 1d supply, fill #0

## 2023-12-03 MED ORDER — NITROFURANTOIN MONOHYD MACRO 100 MG PO CAPS
100.0000 mg | ORAL_CAPSULE | Freq: Every day | ORAL | 0 refills | Status: AC
  Filled 2023-12-03: qty 3, 3d supply, fill #0

## 2023-12-03 NOTE — Progress Notes (Signed)
   12/03/23 1045  Spiritual Encounters  Type of Visit Initial  Care provided to: Pt and family  Conversation partners present during encounter Nurse  Reason for visit Routine spiritual support  OnCall Visit No   Chaplain visited with patient per the suggestion of staff.  Chaplain visited with patient and spouse and provided a compassionate presence offered spiritual care before patient's discharge.  Patient was happy to share that she and her spouse were married almost 58 years.  Chaplain celebrated the milestone with the couple.  Rev. Rana M. Nolon Baxter, M.Div.  Chaplain Resident [facility]

## 2023-12-03 NOTE — Progress Notes (Signed)
 Mobility Specialist - Progress Note     12/03/23 1000  Therapy Vitals  Pulse Rate (!) 112  BP 122/83  Mobility  Activity Stood at bedside;Dangled on edge of bed  Level of Assistance Standby assist, set-up cues, supervision of patient - no hands on  Assistive Device Front wheel walker  Distance Ambulated (ft) 2 ft  Range of Motion/Exercises Active  Activity Response Tolerated well  Mobility Referral Yes  Mobility visit 1 Mobility  Mobility Specialist Start Time (ACUTE ONLY) 1000  Mobility Specialist Stop Time (ACUTE ONLY) 1013  Mobility Specialist Time Calculation (min) (ACUTE ONLY) 13 min   Pt resting in bed on RA upon entry. Pt agreeable to participate with encouragement. Pt STS and ambulates to sink before slumping back down on bed. Pt said she couldn't do anymore. Pt returned to bed and left with needs in reach. Bed alarm activated.   Jerri Morale Mobility Specialist 12/06/23, 2:28 PM

## 2023-12-03 NOTE — Discharge Summary (Signed)
 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  Patient has had a several week history of weakness, poor oral intake and weight loss in the setting of her acute infections and oral thrush.  Her weakness worsened since her COVID diagnosis to where she is too weak to ambulate. .  She denies cough, fever or chills, vomiting or diarrhea.  She has decreased urinary output without dysuria. ED course and data review: Tachycardic at 109 with otherwise normal vitals CBC with leukopenia of 3.4, otherwise unremarkable BMP unremarkable Urinalysis with rare bacteria EKG, please reviewed and interpreted showing sinus tachycardia at 110 CT  abdomen and pelvis with contrast showing a partially visualized pneumomediastinum.  Follow-up CT chest with contrast shows a very small left pneumothorax 5% at apex with moderate to severe diffuse pneumomediastinum, stable pulmonary fibrosis and other nonacute findings   The ED provider spoke with radiologist to discuss findings.  Informed that pneumomediastinum possibly related to prior coughing.  No intervention recommended for 5% pneumothorax   Patient was treated with an NS bolus, started on Rocephin and azithromycin for possible pneumonia (not shown on chest CT)   * Failure to thrive (child) On admission. Frailty/physical deconditioning with ambulatory dysfunction. Poor oral intake Suspect secondary to recent acute infections including UTI, COVID, oral candidiasis as well as nutritional related to poor oral intake - due to odynophagia and mouth sores. changed to mechanical soft diet. Treat her oral thrush with IV diflucan, oral nystatin swish and spit. 4/13: PT, OT, palliative care consult.   4/14: Waiting for placement/SNF versus home with home health   COVID-19 virus infection On admission. Supportive care. Outside of therapeutic window of antiviral therapy. Patient is not hypoxic   Pneumothorax on left esophageal perforation ruled out this morning with noncontrasted chest CT after discussion with radiologist.  Patient was initially placed on prophylactic Zosyn pending her CT chest.  Since she does not have a esophageal perforation, can stop the IV Zosyn.  More likely her pneumomediastinum is from her pneumothorax.  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  Her pneumothorax has already decreased in size since admission   Odynophagia  patient has a prior history of Nissen fundoplication.  I think her mouth pain and odynophagia is due to candidiasis. Speech therapy recommends Dysphagia  3/mechanical soft with thin liquids   Pneumomediastinum (HCC) On admission. Small  pneumothorax 5%. Per radiology/EDP conversation that possibly related to rupture of small bleb. Patient clinically stable-monitor for signs of decompensation   esophageal perforation ruled out with noncontrasted chest CT after discussion with radiologist.  Patient was initially placed on prophylactic Zosyn pending her CT chest.  Since she does not have a esophageal perforation, so continued the IV Zosyn.  More likely her pneumomediastinum is from her pneumothorax.  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  Her pneumothorax has already decreased in size since admission   Oral candidiasis Improving with treatment   Leukopenia On admission. 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 On Venofer infusions from oncology This is chronic   Recurrent UTI last urine cx grew staph epi and aerococcus. Pt given po cipro. Staph epi resistant to cipro.  Continue po macrodantin. Pt cannot take doxycycline due to allergy.   Long-term current use of intravenous immunoglobulin (IVIG) On admission. Long-term current use of IVIG. Continue prophylactic Valtrex chronic. Received dose in March 2023 in heme/onc clinic.  Has follow-up with Dr. 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 Long-term current use of IVIG. Continue prophylactic Valtrex  chronic.   Interstitial lung disease (HCC) chronic. Likely the cause of her pneumothorax   Diffuse large B-cell lymphoma of intra-abdominal lymph nodes (HCC) On admission. Secondary immunodeficiency .Recurrent UTIs (Klebsiella and Proteus 3/10). Oral thrush 11/18/2023 on IVIG infusion and prophylactic Valtrex, Urinalysis unremarkable Continue oral fluconazole for 14 days (3/31-4/13/25)   chronic.  She is status post CAR-T therapy several years ago.  Followed by oncology at [facility] oncology   Essential hypertension Continue home medical issues   S/P Nissen fundoplication (without gastrostomy tube)  procedure noted on EGD in 05-2021.  Generalized weakness Patient and family refused SNF/STR and would like to go home with home health PT and OT       Disposition: Home health Diet recommendation:  Discharge Diet Orders (From admission, onward)     Start     Ordered   12/03/23 0000  Diet - low sodium heart healthy        12/03/23 1155           Carb modified diet DISCHARGE MEDICATION: Allergies as of 12/03/2023       Reactions   Levaquin [levofloxacin In D5w] Palpitations, Other (See Comments)   Burning in lower legs   Cefuroxime Axetil Diarrhea   Other reaction(s): Abdominal Pain   Doxycycline Other (See Comments)   Other reaction(s): Abdominal Pain severe        Medication List     STOP taking these medications    buPROPion 150 MG 24 hr tablet Commonly known as: WELLBUTRIN XL   ciprofloxacin 500 MG tablet Commonly known as: Cipro   lidocaine 2 % solution Commonly known as: XYLOCAINE   nystatin 100000 UNIT/ML suspension Commonly known as: MYCOSTATIN   prochlorperazine 10 MG tablet Commonly known as: COMPAZINE   rosuvastatin 10 MG tablet Commonly known as: CRESTOR       TAKE these medications    acetaminophen 325 MG tablet Commonly known as: TYLENOL Take 325 mg by mouth every 6 (six) hours as needed for moderate pain (pain score 4-6).   b complex vitamins tablet Take 1 tablet by mouth daily.   cholecalciferol 25 MCG (1000 UNIT) tablet Commonly known as: VITAMIN D3 Take 2,000 Units by mouth daily.   dexamethasone 4 MG tablet Commonly known as: DECADRON Take  1 tablet (4 mg total) by mouth daily for 10 days. For 1 day; prior to infusion. What changed: when to take this   feeding supplement Liqd Take 237 mLs by mouth 3 (three) times daily between meals.   fluconazole 100 MG tablet Commonly known as: DIFLUCAN Take 1 tablet (100 mg total) by mouth daily for 14 days. What changed: when to take this   gabapentin 300 MG  capsule Commonly known as: NEURONTIN Take 1 capsule (300 mg total) by mouth 2 (two) times daily.   ibuprofen 200 MG tablet Commonly known as: ADVIL Take 200 mg by mouth as needed.   latanoprost 0.005 % ophthalmic solution Commonly known as: XALATAN Place 1 drop into both eyes at bedtime.   megestrol 20 MG tablet Commonly known as: MEGACE Take 20 mg by mouth 2 (two) times daily.   metoCLOPramide 5 MG tablet Commonly known as: REGLAN Take 5 mg by mouth in the morning and at bedtime.   metoprolol tartrate 25 MG tablet Commonly known as: LOPRESSOR Take 0.5 tablets (12.5 mg total) by mouth daily. What changed:  how much to take when to take this   nitrofurantoin (macrocrystal-monohydrate) 100 MG capsule Commonly known as: MACROBID Take 1 capsule (100 mg total) by mouth at bedtime for 3 days. What changed: when to take this   nystatin-lidocaine in diphenhydrAMINE liquid-alum & mag hydroxide-simeth suspension Take 15 mLs by mouth 4 (four) times daily for 10 days.   ondansetron 8 MG tablet Commonly known as: ZOFRAN Take 8 mg by mouth as needed for nausea or vomiting.   pantoprazole 40 MG tablet Commonly known as: PROTONIX Take 1 tablet by mouth in the morning and at bedtime.   potassium chloride SA 20 MEQ tablet Commonly known as: KLOR-CON M 1 pill twice a day x 1 week; and then one a day. What changed:  how much to take how to take this when to take this   valACYclovir 500 MG tablet Commonly known as: VALTREX Take 1 tablet by mouth once daily        Follow-up Information     Danella Penton, MD. Go on 12/09/2023.   Specialty: Internal Medicine Why: @ 2:15pm Contact information: [address] [place] [address] [phone]         Earna Coder, MD. Go on 12/10/2023.   Specialties: Internal Medicine, Oncology Why: Office call patient to set up follow up appt per [facility] information: [address] [place]  [address] [phone]         Stanton Kidney, MD. Schedule an appointment as soon as possible for a visit in 2 week(s).   Specialty: Gastroenterology Why: [facility] Discharge F/UP Contact information: [address] [place] [address] [phone]                Discharge Exam: Ceasar Mons Weights   11/29/23 1919  Weight: 54.4 kg   Constitutional:      General: She is not in acute distress.    Appearance: She is not toxic-appearing or diaphoretic.     Comments: Chronically ill appearing, cachetic female  HENT:     Head: Normocephalic and atraumatic.     Nose: Nose normal.  Eyes:     General: No scleral icterus. Cardiovascular:     Rate and Rhythm: Normal rate and regular rhythm.  Pulmonary:     Effort: Pulmonary effort is normal.     Breath sounds: Normal breath sounds.  Abdominal:  General: Bowel sounds are normal. There is no distension.     Palpations: Abdomen is soft.     Tenderness: There is no abdominal tenderness.  Musculoskeletal:     Right lower leg: No edema.     Left lower leg: No edema.     Comments: Right ant port-a-cath  Skin:    General: Skin is warm and dry.     Capillary Refill: Capillary refill takes less than 2 seconds.  Neurological:     General: No focal deficit present.     Mental Status: She is alert and oriented to person, place, and time.   Condition at discharge: fair  The results of significant diagnostics from this hospitalization (including imaging, microbiology, ancillary and laboratory) are listed below for reference.   Imaging Studies: CT CHEST WO CONTRAST Result Date: 11/30/2023 CLINICAL DATA:  Pneumomediastinum. Evaluate for esophageal perforation. EXAM: CT CHEST WITHOUT CONTRAST TECHNIQUE: Multidetector CT imaging of the chest was performed following the standard protocol without IV contrast. RADIATION DOSE REDUCTION: This exam was performed according to the departmental dose-optimization program which  includes automated exposure control, adjustment of the mA and/or kV according to patient size and/or use of iterative reconstruction technique. COMPARISON:  Chest CT 11/29/2023. FINDINGS: Cardiovascular: The heart size is normal. No substantial pericardial effusion. Coronary artery calcification is evident. Mild atherosclerotic calcification is noted in the wall of the thoracic aorta. Right Port-A-Cath tip is positioned in the mid SVC. Mediastinum/Nodes: No mediastinal lymphadenopathy. No evidence for gross hilar lymphadenopathy although assessment is limited by the lack of intravenous contrast on the current study. Extensive pneumomediastinum again noted, similar to prior. Patient was given p.o. water-soluble contrast immediately prior to scanning. There is no contrast extravasation from the esophagus to suggest esophageal leak. Sequelae of Nissen fundoplication evident with hiatal hernia including the wrapped portion of the stomach and distal esophagus. No fluid within the mediastinum. There is no axillary lymphadenopathy. Lungs/Pleura: The small left apical pneumothorax seen previously has decreased in the interval. Subpleural reticulation in both lungs with basilar predominant diffuse interstitial thickening suggests underlying pulmonary fibrosis. No dense consolidative airspace disease. No pleural effusion. Upper Abdomen: No acute findings within the visualized upper abdomen. Musculoskeletal: No worrisome lytic or sclerotic osseous abnormality. IMPRESSION: 1. Extensive pneumomediastinum again noted, similar to prior. Patient was given p.o. water-soluble contrast immediately prior to scanning. There is no contrast extravasation from the esophagus to suggest esophageal leak. 2. Sequelae of Nissen fundoplication with hiatal hernia including the wrapped portion of the stomach and distal esophagus. 3. Small left apical pneumothorax seen previously has decreased in the interval. 4. Subpleural reticulation in both  lungs with basilar predominant diffuse interstitial thickening suggests underlying pulmonary fibrosis. 5.  Aortic Atherosclerois (ICD10-170.0) Electronically Signed   By: Donnal Fusi M.D.   On: 11/30/2023 09:06   DG HIP UNILAT WITH PELVIS 2-3 VIEWS RIGHT Result Date: 11/30/2023 CLINICAL DATA:  Fall, right hip pain EXAM: DG HIP (WITH OR WITHOUT PELVIS) 2-3V RIGHT COMPARISON:  None Available. FINDINGS: Contrast material noted within the urinary bladder from earlier contrasted study. Contrast is now at external to the patient, possibly within clothing or diaper. This causes artifact over the bony structures of the lower pelvis, limiting study. No visible fracture, and no fracture seen on earlier CT. No subluxation or dislocation. IMPRESSION: Limited study due to contrast material on clothing or diaper. No visible fracture, and no fracture seen on earlier CT. Electronically Signed   By: Janeece Mechanic M.D.   On: 11/30/2023  02:17   CT Chest W Contrast Result Date: 11/29/2023 CLINICAL DATA:  Recent COVID with possible pneumonia. EXAM: CT CHEST WITH CONTRAST TECHNIQUE: Multidetector CT imaging of the chest was performed during intravenous contrast administration. RADIATION DOSE REDUCTION: This exam was performed according to the departmental dose-optimization program which includes automated exposure control, adjustment of the mA and/or kV according to patient size and/or use of iterative reconstruction technique. CONTRAST:  75mL OMNIPAQUE IOHEXOL 300 MG/ML  SOLN COMPARISON:  Chest x-ray 11/29/2023.  Head CT 12/11/2022. FINDINGS: Cardiovascular: The heart is moderately enlarged. There is no pericardial effusion. Aorta is normal in size. There are atherosclerotic calcifications of the aorta. Right chest port catheter tip ends in the mid SVC. Mediastinum/Nodes: There is moderate severe diffuse pneumomediastinum. Visualized thyroid gland is within normal limits. No enlarged lymph nodes are identified. There is a  moderate-sized hiatal hernia similar to prior. Lungs/Pleura: Peripheral fibrotic changes are seen throughout both lungs in the lower lobe predominance. There is no focal pneumonia or pleural effusion. There is a very small left pneumothorax in the apex (5%). Trachea and central airways are patent. Upper Abdomen: There is an 11 mm hypodensity in the spleen, possibly a complex cyst or hemangioma. This is unchanged from prior. Musculoskeletal: No chest wall abnormality. No acute or significant osseous findings. IMPRESSION: 1. Very small left pneumothorax in the apex (5%). 2. Moderate to severe diffuse pneumomediastinum. 3. Stable pulmonary fibrosis.  No focal pneumonia. 4. Moderate cardiomegaly. 5. Moderate-sized hiatal hernia. 6. Aortic atherosclerosis. Electronically Signed   By: Tyron Gallon M.D.   On: 11/29/2023 23:30   DG Chest Portable 1 View Addendum Date: 11/29/2023 ADDENDUM REPORT: 11/29/2023 22:29 ADDENDUM: Lucency at the left apex with no definite pneumothorax. Pneumomediastinum noted on CT chest not well visualized. Electronically Signed   By: Morgane  Naveau M.D.   On: 11/29/2023 22:29   Result Date: 11/29/2023 CLINICAL DATA:  Shortness of breath, COVID diagnosis, evaluating for secondary pneumonia EXAM: PORTABLE CHEST 1 VIEW COMPARISON:  Chest x-ray 11/19/2023 FINDINGS: Right chest wall dialysis catheter with tip overlying the expected region of the distal superior vena cava. The heart and mediastinal contours are within normal limits. Question developing airspace opacity along the right peripheral mid lung zone. Right lung surgical pulmonary sutures. Chronic coarsened interstitial markings with no overt pulmonary edema. No pleural effusion. No pneumothorax. No acute osseous abnormality. IMPRESSION: Question developing airspace opacity along the right peripheral mid lung zone. Electronically Signed: By: Morgane  Naveau M.D. On: 11/29/2023 22:12   CT ABDOMEN PELVIS W CONTRAST Addendum Date:  11/29/2023 ADDENDUM REPORT: 11/29/2023 22:29 ADDENDUM: These results were called by telephone at the time of interpretation on 11/29/2023 at 10:29 pm to provider DAVID WELLS , who verbally acknowledged these results. Electronically Signed   By: Morgane  Naveau M.D.   On: 11/29/2023 22:29   Result Date: 11/29/2023 CLINICAL DATA:  [place] right lower quadrant pain associated with nausea and dehydration. Evaluate for appendicitis or other acute intra-abdominal infection EXAM: CT ABDOMEN AND PELVIS WITH CONTRAST TECHNIQUE: Multidetector CT imaging of the abdomen and pelvis was performed using the standard protocol following bolus administration of intravenous contrast. RADIATION DOSE REDUCTION: This exam was performed according to the departmental dose-optimization program which includes automated exposure control, adjustment of the mA and/or kV according to patient size and/or use of iterative reconstruction technique. CONTRAST:  100mL OMNIPAQUE IOHEXOL 300 MG/ML  SOLN COMPARISON:  None Available. FINDINGS: Lower chest: Emphysematous changes. Pneumomediastinum. At least small volume sliding and paraesophageal hernia. Hepatobiliary:  No focal liver abnormality. No gallstones, gallbladder wall thickening, or pericholecystic fluid. No biliary dilatation. Pancreas: Diffusely atrophic. No focal lesion. Otherwise normal pancreatic contour. No surrounding inflammatory changes. No main pancreatic ductal dilatation. Spleen: Normal in size.  Fluid density lesions likely cysts. Adrenals/Urinary Tract: No adrenal nodule bilaterally. Bilateral kidneys enhance symmetrically. Atrophy of the left inferior renal pole. No hydronephrosis. No hydroureter. The urinary bladder is decompressed with urinary bladder wall thickening and mucosal hyperemia. Foci of gas within the urinary bladder lumen. On delayed imaging, there is no urothelial wall thickening and there are no filling defects in the opacified portions of the bilateral collecting  systems or ureters. Stomach/Bowel: Stomach is within normal limits. No evidence of bowel wall thickening or dilatation. Appendix appears normal. Vascular/Lymphatic: No abdominal aorta or iliac aneurysm. Moderate atherosclerotic plaque of the aorta and its branches. No abdominal, pelvic, or inguinal lymphadenopathy. Reproductive: Status post hysterectomy. No adnexal masses. Other: No intraperitoneal free fluid. No intraperitoneal free gas. No organized fluid collection. Musculoskeletal: No abdominal wall hernia or abnormality. No suspicious lytic or blastic osseous lesions. No acute displaced fracture. IMPRESSION: 1. Partially visualized pneumomediastinum. Recommend CT chest with intravenous contrast for further evaluation. 2.   At least small volume sliding and paraesophageal hernia. 3. Decompressed urinary bladder with limited evaluation. Given findings, correlate with urinalysis for cystitis. 4.  Aortic Atherosclerosis (ICD10-I70.0). Electronically Signed: By: Tish Frederickson M.D. On: 11/29/2023 22:19   DG Chest 2 View Result Date: 11/21/2023 CLINICAL DATA:  Weakness for 3 weeks.  History of lymphoma. EXAM: CHEST - 2 VIEW COMPARISON:  November 19, 2023. FINDINGS: The heart size and mediastinal contours are within normal limits. Right internal jugular Port-A-Cath is unchanged. Stable mild reticular densities are noted in the lungs most consistent with chronic interstitial lung disease or scarring. No definite acute pulmonary abnormality is noted. The visualized skeletal structures are unremarkable. IMPRESSION: Stable chronic findings as noted above. No definite acute abnormality seen. Electronically Signed   By: Lupita Raider M.D.   On: 11/21/2023 12:58   DG Chest 2 View Result Date: 11/20/2023 CLINICAL DATA:  Cough history of large B-cell lymphoma EXAM: CHEST - 2 VIEW COMPARISON:  CT cardiac June 24, 2023 chest x-ray November 22, 2016 FINDINGS: Bilateral reticular interstitial prominence, similar to prior CT may  correlate with chronic interstitial lung disease and COPD changes. No obvious superimposed pneumonic infiltrates. Heart and mediastinum normal without pleural effusions Right IJ Infuse-A-Port catheter in good position IMPRESSION: Chronic interstitial lung disease. No acute cardiopulmonary infiltrates. Small hiatal hernia. Electronically Signed   By: Shaaron Adler M.D.   On: 11/20/2023 08:58   US Venous Img Lower Unilateral Right (DVT) Result Date: 11/19/2023 CLINICAL DATA:  Right lower extremity swelling for 1 month. Fall 3 weeks ago. EXAM: RIGHT LOWER EXTREMITY VENOUS DOPPLER ULTRASOUND TECHNIQUE: Gray-scale sonography with compression, as well as color and duplex ultrasound, were performed to evaluate the deep venous system(s) from the level of the common femoral vein through the popliteal and proximal calf veins. COMPARISON:  None available FINDINGS: VENOUS Normal compressibility of the common femoral, superficial femoral, and popliteal veins, as well as the visualized calf veins. Visualized portions of profunda femoral vein and great saphenous vein unremarkable. No filling defects to suggest DVT on grayscale or color Doppler imaging. Doppler waveforms show normal direction of venous flow, normal respiratory plasticity and response to augmentation. Limited views of the contralateral common femoral vein are unremarkable. OTHER None. Limitations: Calf veins not well visualized due to edema.  IMPRESSION: No right lower extremity DVT. Electronically Signed   By: Elester Grim M.D.   On: 11/19/2023 11:20    Microbiology: Results for orders placed or performed during the hospital encounter of 11/21/23  Resp panel by RT-PCR (RSV, Flu A&B, Covid) Anterior Nasal Swab     Status: Abnormal   Collection Time: 11/21/23 12:51 PM   Specimen: Anterior Nasal Swab  Result Value Ref Range Status   SARS Coronavirus 2 by RT PCR POSITIVE (A) NEGATIVE Final    Comment: (NOTE) SARS-CoV-2 target nucleic acids are  DETECTED.  The SARS-CoV-2 RNA is generally detectable in upper respiratory specimens during the acute phase of infection. Positive results are indicative of the presence of the identified virus, but do not rule out bacterial infection or co-infection with other pathogens not detected by the test. Clinical correlation with patient history and other diagnostic information is necessary to determine patient infection status. The expected result is Negative.  Fact Sheet for Patients: BloggerCourse.com  Fact Sheet for Healthcare Providers: SeriousBroker.it  This test is not yet approved or cleared by the [place]  FDA and  has been authorized for detection and/or diagnosis of SARS-CoV-2 by FDA under an Emergency Use Authorization (EUA).  This EUA will remain in effect (meaning this test can be used) for the duration of  the COVID-19 declaration under Section 564(b)(1) of the [place] ct, 21 U.S.C. section 360bbb-3(b)(1), unless the authorization is terminated or revoked sooner.     Influenza A by PCR NEGATIVE NEGATIVE Final   Influenza B by PCR NEGATIVE NEGATIVE Final    Comment: (NOTE) The Xpert Xpress SARS-CoV-2/FLU/RSV plus assay is intended as an aid in the diagnosis of influenza from Nasopharyngeal swab specimens and should not be used as a sole basis for treatment. Nasal washings and aspirates are unacceptable for Xpert Xpress SARS-CoV-2/FLU/RSV testing.  Fact Sheet for Patients: BloggerCourse.com  Fact Sheet for Healthcare Providers: SeriousBroker.it  This test is not yet approved or cleared by the [place]  FDA and has been authorized for detection and/or diagnosis of SARS-CoV-2 by FDA under an Emergency Use Authorization (EUA). This EUA will remain in effect (meaning this test can be used) for the duration of the COVID-19 declaration under Section 564(b)(1) of the Act, 21  U.S.C. section 360bbb-3(b)(1), unless the authorization is terminated or revoked.     Resp Syncytial Virus by PCR NEGATIVE NEGATIVE Final    Comment: (NOTE) Fact Sheet for Patients: BloggerCourse.com  Fact Sheet for Healthcare Providers: SeriousBroker.it  This test is not yet approved or cleared by the [place]  FDA and has been authorized for detection and/or diagnosis of SARS-CoV-2 by FDA under an Emergency Use Authorization (EUA). This EUA will remain in effect (meaning this test can be used) for the duration of the COVID-19 declaration under Section 564(b)(1) of the Act, 21 U.S.C. section 360bbb-3(b)(1), unless the authorization is terminated or revoked.  Performed at [facility], [address] [place], [place] [address]     Labs: CBC: Recent Labs  Lab 11/29/23 1932 12/01/23 0557 12/02/23 0445  WBC 3.4* 2.5* 2.6*  NEUTROABS 2.0 1.4*  --   HGB 13.7 11.8* 12.1  HCT 41.7 34.6* 35.0*  MCV 93.3 88.7 89.5  PLT 262 216 209   Basic Metabolic Panel: Recent Labs  Lab 11/29/23 1932 12/01/23 0557 12/02/23 0445 12/03/23 0556  NA 138 134* 135 134*  K 4.2 2.9* 3.6 3.5  CL 102 103 102 104  CO2 23 21* 20* 21*  GLUCOSE 123*  109* 82 98  BUN 8 6* 5* 9  CREATININE 0.43* 0.38* 0.35* 0.38*  CALCIUM 9.6 8.8* 9.2 9.0  MG  --   --  1.8 2.1  PHOS  --   --  2.7 2.7   Liver Function Tests: Recent Labs  Lab 12/01/23 0557 12/02/23 0445 12/03/23 0556  AST 17  --   --   ALT 8  --   --   ALKPHOS 57  --   --   BILITOT 0.5  --   --   PROT 5.6*  --   --   ALBUMIN 2.7* 2.7* 2.8*   CBG: No results for input(s): "GLUCAP" in the last 168 hours.  Discharge time spent: greater than 30 minutes.  Signed: Brenna Cam, MD [facility] 12/03/2023

## 2023-12-03 NOTE — Care Management Important Message (Signed)
 Important Message  Patient Details  Name: Rose Hill MRN: [id number] Date of Birth: 30-Mar-1946   Important Message Given:  Yes - Medicare IM     Jaszmine Navejas W, CMA 12/03/2023, 1:33 PM

## 2023-12-03 NOTE — Telephone Encounter (Signed)
 Pt is being discharged today. 1C is requesting Hospital Follow-up in one week. Dr. B schedule does not allow for follow-up scheduling. Please advise.

## 2023-12-03 NOTE — Plan of Care (Signed)

## 2023-12-03 NOTE — TOC Transition Note (Signed)
 Transition of Care [facility]) - Discharge Note   Patient Details  Name: Rose Hill MRN: [id number] Date of Birth: 1945/12/06  Transition of Care [facility] CM/SW Contact:  Elsie Halo, RN Phone Number: 12/03/2023, 12:08 PM   Clinical Narrative:     Patient is from home with her spouse. She continues to drive and has no difficulty getting to MD appointments or getting or obtaining medications. She has a walker at home.   Patient is medically clear to dc to home w/ HH PT OT. Bartholomew Light with [facility] accepted the referral. Husband is at the bedside and will transport the patient.  No other TOC needs identified.        Patient Goals and CMS Choice            Discharge Placement                       Discharge Plan and Services Additional resources added to the After Visit Summary for                                       Social Drivers of Health (SDOH) Interventions SDOH Screenings   Food Insecurity: No Food Insecurity (11/30/2023)  Housing: Low Risk  (11/30/2023)  Transportation Needs: No Transportation Needs (11/30/2023)  Utilities: Not At Risk (11/30/2023)  Financial Resource Strain: Low Risk  (06/19/2023)   Received from [facility]  Social Connections: Patient Unable To Answer (11/30/2023)  Tobacco Use: Medium Risk (11/29/2023)     Readmission Risk Interventions     No data to display

## 2023-12-03 NOTE — Consult Note (Signed)
 PHARMACY CONSULT NOTE - FOLLOW UP  Pharmacy Consult for Electrolyte Monitoring and Replacement   Recent Labs: Potassium (mmol/L)  Date Value  12/03/2023 3.5  07/01/2014 4.1   Magnesium (mg/dL)  Date Value  65/78/4696 2.1   Calcium (mg/dL)  Date Value  29/52/8413 9.0   Albumin (g/dL)  Date Value  24/40/1027 2.8 (L)   Phosphorus (mg/dL)  Date Value  25/36/6440 2.7   Sodium (mmol/L)  Date Value  12/03/2023 134 (L)     Assessment: 78 y.o. 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   Goal of Therapy: WNL  Plan:  K=3.5 (decreased from 3.6 and at lower end of normal range). Will replace with Kcl 20meq po x 1 dose today.  Mg 2.1, improved from 1.8, no additional supplementation needed Follow AM labs   Rose Hill, PharmD 12/03/2023 7:09 AM

## 2023-12-03 NOTE — Progress Notes (Signed)
 Physical Therapy Treatment Patient Details Name: Rose Hill MRN: [id number] DOB: 1946/04/02 Today's Date: 12/03/2023   History of Present Illness Patient is a 78 year old female with failure to thrive, physical deconditioning with ambulatory dysfunction, recently positive for COVID, UTI. 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    PT Comments  Pt is making limited progress towards goals with poor activity tolerance noted, only able to sit at EOB for 1-2 minutes prior to fatigue. Educated on HEP and discussed energy conservation with tasking at home. Pt reports she is discharging today. Recommend HHPT at this time. Will continue to progress.    If plan is discharge home, recommend the following: A lot of help with walking and/or transfers;A lot of help with bathing/dressing/bathroom;Assistance with cooking/housework;Help with stairs or ramp for entrance   Can travel by private vehicle     No  Equipment Recommendations  None recommended by PT    Recommendations for Other Services       Precautions / Restrictions Precautions Precautions: Fall Recall of Precautions/Restrictions: Intact Restrictions Weight Bearing Restrictions Per Provider Order: No     Mobility  Bed Mobility Overal bed mobility: Needs Assistance Bed Mobility: Supine to Sit, Sit to Supine     Supine to sit: Contact guard Sit to supine: Contact guard assist   General bed mobility comments: good effort noted, however pt fatigues quickly and only able to tolerate sitting at EOB x 1-2 min max prior to fatigue. Pt then returns back supine.    Transfers                   General transfer comment: unable to tolerate    Ambulation/Gait                   Stairs             Wheelchair Mobility     Tilt Bed    Modified  Rankin (Stroke Patients Only)       Balance Overall balance assessment: Needs assistance Sitting-balance support: Feet supported Sitting balance-Leahy Scale: Fair                                      Hotel manager: No apparent difficulties  Cognition Arousal: Alert Behavior During Therapy: WFL for tasks assessed/performed   PT - Cognitive impairments: No apparent impairments                       PT - Cognition Comments: pleasant and agreeable to session Following commands: Impaired Following commands impaired: Follows one step commands with increased time    Cueing Cueing Techniques: Verbal cues  Exercises Other Exercises Other Exercises: supine ther-ex reviewed including SLRs and knee flexion stretches. 10 reps performed. Also discussed car transfers    General Comments        Pertinent Vitals/Pain Pain Assessment Pain Assessment: No/denies pain    Home Living                          Prior Function            PT Goals (current goals can now be found in the care plan section) Acute Rehab PT Goals Patient Stated Goal: spouse  goal is to get the patient to eat more PT Goal Formulation: With family Time For Goal Achievement: 12/15/23 Potential to Achieve Goals: Fair Progress towards PT goals: Progressing toward goals    Frequency    Min 2X/week      PT Plan      Co-evaluation              AM-PAC PT "6 Clicks" Mobility   Outcome Measure  Help needed turning from your back to your side while in a flat bed without using bedrails?: A Little Help needed moving from lying on your back to sitting on the side of a flat bed without using bedrails?: A Little Help needed moving to and from a bed to a chair (including a wheelchair)?: A Lot Help needed standing up from a chair using your arms (e.g., wheelchair or bedside chair)?: A Lot Help needed to walk in hospital room?: A Lot Help needed  climbing 3-5 steps with a railing? : Total 6 Click Score: 13    End of Session   Activity Tolerance: Patient limited by fatigue Patient left: in bed;with call bell/phone within reach;with bed alarm set;with family/visitor present Nurse Communication: Mobility status PT Visit Diagnosis: Muscle weakness (generalized) (M62.81);Unsteadiness on feet (R26.81)     Time: 1308-6578 PT Time Calculation (min) (ACUTE ONLY): 13 min  Charges:    $Therapeutic Exercise: 8-22 mins PT General Charges $$ ACUTE PT VISIT: 1 Visit                     Rose Hill, PT, DPT, GCS [phone]    Rose Hill 12/03/2023, 12:15 PM

## 2023-12-04 ENCOUNTER — Telehealth: Payer: Self-pay | Admitting: *Deleted

## 2023-12-04 NOTE — Telephone Encounter (Signed)
 Husband thinks that she needs a little  time off before start back treatment or if Dr. Valentine Gasmen thinks she can come in as the appt date he will do what ever Valentine Gasmen  says. Needs call back

## 2023-12-05 ENCOUNTER — Encounter: Payer: Self-pay | Admitting: Internal Medicine

## 2023-12-05 NOTE — Telephone Encounter (Signed)
 Dr. Geraldene Kleine, Mr. Terry doesn't think patient will be physically able to come to appt on 12/09/23.  OK to r/s?

## 2023-12-05 NOTE — Telephone Encounter (Signed)
 Spoke to patient's husband to inform him of Dr. B advise to keep appt that are scheduled on 12/09/23.    Mr. Fede states that patient is basically bed bound.  She doesn't get out of bed due to weakness and has been in bed since hospital dc 2 days ago.  Also, did not get out of bed during hospital admission.  She is not eating but does drink plenty of water.  Reports good vitals with no fever.

## 2023-12-05 NOTE — Telephone Encounter (Signed)
 Rose Hill notified OK to r/s appts.  He would like to cancel 12/09/23 appts and he will call us  on Monday for an update and r/s preference.    Please cancel 12/09/23 appts.

## 2023-12-09 ENCOUNTER — Inpatient Hospital Stay: Admitting: Internal Medicine

## 2023-12-09 ENCOUNTER — Inpatient Hospital Stay

## 2023-12-09 ENCOUNTER — Other Ambulatory Visit

## 2023-12-11 ENCOUNTER — Other Ambulatory Visit: Payer: Self-pay | Admitting: Internal Medicine

## 2023-12-12 ENCOUNTER — Encounter: Payer: Self-pay | Admitting: Internal Medicine

## 2023-12-17 ENCOUNTER — Ambulatory Visit: Admitting: Urology

## 2023-12-27 ENCOUNTER — Inpatient Hospital Stay: Attending: Internal Medicine

## 2024-01-08 ENCOUNTER — Inpatient Hospital Stay

## 2024-01-17 ENCOUNTER — Telehealth: Payer: Self-pay | Admitting: Internal Medicine

## 2024-01-17 NOTE — Telephone Encounter (Signed)
 I called patient's husband to offer my condolences; unable to reach-   GB  FYI

## 2024-01-19 DEATH — deceased

## 2024-01-21 ENCOUNTER — Other Ambulatory Visit

## 2024-01-28 ENCOUNTER — Ambulatory Visit

## 2024-01-28 ENCOUNTER — Ambulatory Visit: Admitting: Internal Medicine

## 2024-01-29 ENCOUNTER — Ambulatory Visit

## 2024-02-03 ENCOUNTER — Other Ambulatory Visit

## 2024-02-10 ENCOUNTER — Ambulatory Visit: Admitting: Internal Medicine

## 2024-02-10 ENCOUNTER — Ambulatory Visit

## 2024-02-11 ENCOUNTER — Ambulatory Visit
